# Patient Record
Sex: Male | Born: 1935 | Race: Black or African American | Hispanic: No | Smoking: Never smoker
Health system: Southern US, Community
[De-identification: ages and names within clinical notes are randomized; demographics above are authoritative.]

## PROBLEM LIST (undated history)

## (undated) DIAGNOSIS — M1990 Unspecified osteoarthritis, unspecified site: Secondary | ICD-10-CM

## (undated) DIAGNOSIS — I2510 Atherosclerotic heart disease of native coronary artery without angina pectoris: Secondary | ICD-10-CM

## (undated) DIAGNOSIS — I770 Arteriovenous fistula, acquired: Secondary | ICD-10-CM

## (undated) DIAGNOSIS — N186 End stage renal disease: Secondary | ICD-10-CM

## (undated) DIAGNOSIS — K6289 Other specified diseases of anus and rectum: Secondary | ICD-10-CM

## (undated) DIAGNOSIS — E785 Hyperlipidemia, unspecified: Secondary | ICD-10-CM

## (undated) DIAGNOSIS — Z794 Long term (current) use of insulin: Secondary | ICD-10-CM

## (undated) DIAGNOSIS — Z8673 Personal history of transient ischemic attack (TIA), and cerebral infarction without residual deficits: Secondary | ICD-10-CM

## (undated) DIAGNOSIS — I96 Gangrene, not elsewhere classified: Secondary | ICD-10-CM

## (undated) DIAGNOSIS — I639 Cerebral infarction, unspecified: Secondary | ICD-10-CM

## (undated) DIAGNOSIS — K5900 Constipation, unspecified: Secondary | ICD-10-CM

## (undated) DIAGNOSIS — D649 Anemia, unspecified: Secondary | ICD-10-CM

## (undated) DIAGNOSIS — E119 Type 2 diabetes mellitus without complications: Secondary | ICD-10-CM

## (undated) DIAGNOSIS — Z992 Dependence on renal dialysis: Secondary | ICD-10-CM

## (undated) DIAGNOSIS — I219 Acute myocardial infarction, unspecified: Secondary | ICD-10-CM

## (undated) DIAGNOSIS — I10 Essential (primary) hypertension: Secondary | ICD-10-CM

## (undated) HISTORY — PX: CATARACT EXTRACTION W/ INTRAOCULAR LENS  IMPLANT, BILATERAL: SHX1307

## (undated) HISTORY — PX: ARTERIOVENOUS GRAFT PLACEMENT: SUR1029

---

## 2007-02-09 HISTORY — PX: TOE AMPUTATION: SHX809

## 2007-12-30 ENCOUNTER — Encounter (INDEPENDENT_AMBULATORY_CARE_PROVIDER_SITE_OTHER): Payer: Self-pay | Admitting: Internal Medicine

## 2008-01-19 ENCOUNTER — Ambulatory Visit: Payer: Self-pay | Admitting: Internal Medicine

## 2008-01-19 DIAGNOSIS — I10 Essential (primary) hypertension: Secondary | ICD-10-CM

## 2008-01-19 DIAGNOSIS — K219 Gastro-esophageal reflux disease without esophagitis: Secondary | ICD-10-CM

## 2008-01-19 DIAGNOSIS — Z8679 Personal history of other diseases of the circulatory system: Secondary | ICD-10-CM

## 2008-01-19 DIAGNOSIS — I252 Old myocardial infarction: Secondary | ICD-10-CM

## 2008-01-19 DIAGNOSIS — M1990 Unspecified osteoarthritis, unspecified site: Secondary | ICD-10-CM | POA: Insufficient documentation

## 2008-01-19 DIAGNOSIS — E119 Type 2 diabetes mellitus without complications: Secondary | ICD-10-CM

## 2008-01-19 LAB — CONVERTED CEMR LAB: Blood Glucose, Fingerstick: 268

## 2008-01-20 LAB — CONVERTED CEMR LAB
AST: 38 units/L — ABNORMAL HIGH (ref 0–37)
BUN: 36 mg/dL — ABNORMAL HIGH (ref 6–23)
CO2: 19 meq/L (ref 19–32)
Calcium: 8.9 mg/dL (ref 8.4–10.5)
Chloride: 103 meq/L (ref 96–112)
Creatinine, Ser: 2.29 mg/dL — ABNORMAL HIGH (ref 0.40–1.50)

## 2008-01-22 ENCOUNTER — Telehealth (INDEPENDENT_AMBULATORY_CARE_PROVIDER_SITE_OTHER): Payer: Self-pay | Admitting: *Deleted

## 2008-01-25 ENCOUNTER — Ambulatory Visit: Payer: Self-pay | Admitting: Internal Medicine

## 2008-01-25 LAB — CONVERTED CEMR LAB: Blood Glucose, Fingerstick: 412

## 2008-02-01 ENCOUNTER — Ambulatory Visit: Payer: Self-pay | Admitting: Internal Medicine

## 2008-02-16 ENCOUNTER — Ambulatory Visit: Payer: Self-pay | Admitting: Internal Medicine

## 2008-03-18 ENCOUNTER — Ambulatory Visit: Payer: Self-pay | Admitting: Internal Medicine

## 2008-03-18 LAB — CONVERTED CEMR LAB: Blood Glucose, Fingerstick: 339

## 2008-04-18 ENCOUNTER — Ambulatory Visit: Payer: Self-pay | Admitting: Internal Medicine

## 2008-04-18 LAB — CONVERTED CEMR LAB: Blood Glucose, Fingerstick: 256

## 2008-05-16 ENCOUNTER — Ambulatory Visit: Payer: Self-pay | Admitting: Internal Medicine

## 2008-06-13 ENCOUNTER — Ambulatory Visit: Payer: Self-pay | Admitting: Internal Medicine

## 2008-07-11 ENCOUNTER — Ambulatory Visit: Payer: Self-pay | Admitting: Internal Medicine

## 2008-07-11 DIAGNOSIS — R319 Hematuria, unspecified: Secondary | ICD-10-CM | POA: Insufficient documentation

## 2008-07-12 ENCOUNTER — Encounter (INDEPENDENT_AMBULATORY_CARE_PROVIDER_SITE_OTHER): Payer: Self-pay | Admitting: Internal Medicine

## 2008-07-12 LAB — CONVERTED CEMR LAB
CO2: 21 meq/L (ref 19–32)
Cholesterol: 208 mg/dL — ABNORMAL HIGH (ref 0–200)
Creatinine, Ser: 2.38 mg/dL — ABNORMAL HIGH (ref 0.40–1.50)
Glucose, Bld: 172 mg/dL — ABNORMAL HIGH (ref 70–99)
Total Bilirubin: 0.5 mg/dL (ref 0.3–1.2)
Total CHOL/HDL Ratio: 1.8
Triglycerides: 46 mg/dL (ref <150)
VLDL: 9 mg/dL (ref 0–40)

## 2008-07-28 ENCOUNTER — Encounter (INDEPENDENT_AMBULATORY_CARE_PROVIDER_SITE_OTHER): Payer: Self-pay | Admitting: Internal Medicine

## 2008-07-28 LAB — CONVERTED CEMR LAB: Bacteria, UA: NONE SEEN

## 2008-08-22 ENCOUNTER — Ambulatory Visit: Payer: Self-pay | Admitting: Internal Medicine

## 2008-08-22 DIAGNOSIS — R5381 Other malaise: Secondary | ICD-10-CM

## 2008-08-22 DIAGNOSIS — R5383 Other fatigue: Secondary | ICD-10-CM

## 2008-08-22 LAB — CONVERTED CEMR LAB
AST: 29 units/L (ref 0–37)
Albumin: 4.1 g/dL (ref 3.5–5.2)
BUN: 58 mg/dL — ABNORMAL HIGH (ref 6–23)
Calcium: 9.2 mg/dL (ref 8.4–10.5)
Chloride: 110 meq/L (ref 96–112)
Eosinophils Relative: 2 % (ref 0–5)
HCT: 35.7 % — ABNORMAL LOW (ref 39.0–52.0)
Hemoglobin: 12.4 g/dL — ABNORMAL LOW (ref 13.0–17.0)
Lymphocytes Relative: 24 % (ref 12–46)
Lymphs Abs: 1.1 10*3/uL (ref 0.7–4.0)
Monocytes Absolute: 0.2 10*3/uL (ref 0.1–1.0)
Platelets: 134 10*3/uL — ABNORMAL LOW (ref 150–400)
Potassium: 5.8 meq/L — ABNORMAL HIGH (ref 3.5–5.3)
Sodium: 135 meq/L (ref 135–145)
Total Protein: 7.2 g/dL (ref 6.0–8.3)
WBC: 4.5 10*3/uL (ref 4.0–10.5)

## 2008-09-19 ENCOUNTER — Ambulatory Visit: Payer: Self-pay | Admitting: Internal Medicine

## 2008-09-19 DIAGNOSIS — N182 Chronic kidney disease, stage 2 (mild): Secondary | ICD-10-CM | POA: Insufficient documentation

## 2008-09-21 LAB — CONVERTED CEMR LAB
Albumin: 4 g/dL (ref 3.5–5.2)
BUN: 48 mg/dL — ABNORMAL HIGH (ref 6–23)
Calcium: 8.5 mg/dL (ref 8.4–10.5)
Creatinine, Ser: 2.47 mg/dL — ABNORMAL HIGH (ref 0.40–1.50)
Phosphorus: 3.4 mg/dL (ref 2.3–4.6)
Potassium: 4.8 meq/L (ref 3.5–5.3)

## 2008-11-10 ENCOUNTER — Ambulatory Visit: Payer: Self-pay | Admitting: Internal Medicine

## 2011-04-25 ENCOUNTER — Ambulatory Visit: Payer: Self-pay | Admitting: Vascular Surgery

## 2011-04-25 LAB — CBC
HCT: 40.1 % (ref 40.0–52.0)
MCV: 88 fL (ref 80–100)
Platelet: 124 10*3/uL — ABNORMAL LOW (ref 150–440)
RBC: 4.56 10*6/uL (ref 4.40–5.90)
WBC: 7.2 10*3/uL (ref 3.8–10.6)

## 2011-04-25 LAB — BASIC METABOLIC PANEL
Calcium, Total: 8 mg/dL — ABNORMAL LOW (ref 8.5–10.1)
Co2: 20 mmol/L — ABNORMAL LOW (ref 21–32)
EGFR (African American): 19 — ABNORMAL LOW
EGFR (Non-African Amer.): 15 — ABNORMAL LOW
Osmolality: 278 (ref 275–301)
Sodium: 132 mmol/L — ABNORMAL LOW (ref 136–145)

## 2011-05-02 ENCOUNTER — Ambulatory Visit: Payer: Self-pay | Admitting: Vascular Surgery

## 2011-06-06 ENCOUNTER — Ambulatory Visit: Payer: Self-pay | Admitting: Vascular Surgery

## 2011-07-18 ENCOUNTER — Ambulatory Visit: Payer: Self-pay | Admitting: Vascular Surgery

## 2011-07-18 LAB — GLUCOSE, RANDOM: Glucose: 162 mg/dL — ABNORMAL HIGH (ref 65–99)

## 2011-09-26 ENCOUNTER — Ambulatory Visit: Payer: Self-pay | Admitting: Vascular Surgery

## 2011-10-15 ENCOUNTER — Ambulatory Visit: Payer: Self-pay | Admitting: Vascular Surgery

## 2011-10-15 LAB — CBC
MCH: 31.4 pg (ref 26.0–34.0)
Platelet: 121 10*3/uL — ABNORMAL LOW (ref 150–440)
RBC: 3.74 10*6/uL — ABNORMAL LOW (ref 4.40–5.90)
WBC: 5 10*3/uL (ref 3.8–10.6)

## 2011-10-15 LAB — BASIC METABOLIC PANEL
Calcium, Total: 8.1 mg/dL — ABNORMAL LOW (ref 8.5–10.1)
Chloride: 104 mmol/L (ref 98–107)
Co2: 24 mmol/L (ref 21–32)
EGFR (Non-African Amer.): 14 — ABNORMAL LOW
Glucose: 121 mg/dL — ABNORMAL HIGH (ref 65–99)
Potassium: 4.3 mmol/L (ref 3.5–5.1)
Sodium: 138 mmol/L (ref 136–145)

## 2011-11-28 ENCOUNTER — Ambulatory Visit: Payer: Self-pay | Admitting: Vascular Surgery

## 2011-11-29 LAB — CBC WITH DIFFERENTIAL/PLATELET
Basophil %: 0.4 %
Eosinophil #: 0 10*3/uL (ref 0.0–0.7)
Eosinophil %: 0.1 %
HGB: 11.8 g/dL — ABNORMAL LOW (ref 13.0–18.0)
Lymphocyte %: 13.8 %
MCHC: 33.9 g/dL (ref 32.0–36.0)
Neutrophil %: 80.5 %
Platelet: 97 10*3/uL — ABNORMAL LOW (ref 150–440)
RBC: 3.81 10*6/uL — ABNORMAL LOW (ref 4.40–5.90)

## 2012-01-20 ENCOUNTER — Ambulatory Visit: Payer: Self-pay | Admitting: Vascular Surgery

## 2012-01-21 ENCOUNTER — Ambulatory Visit: Payer: Self-pay | Admitting: Vascular Surgery

## 2012-01-29 ENCOUNTER — Other Ambulatory Visit (HOSPITAL_COMMUNITY): Payer: Self-pay | Admitting: Nephrology

## 2012-01-29 DIAGNOSIS — N186 End stage renal disease: Secondary | ICD-10-CM

## 2012-02-04 ENCOUNTER — Other Ambulatory Visit (HOSPITAL_COMMUNITY): Payer: Self-pay | Admitting: Nephrology

## 2012-02-04 ENCOUNTER — Encounter (HOSPITAL_COMMUNITY): Payer: Self-pay

## 2012-02-04 ENCOUNTER — Ambulatory Visit (HOSPITAL_COMMUNITY)
Admission: RE | Admit: 2012-02-04 | Discharge: 2012-02-04 | Disposition: A | Discharge status: Home / Self Care | Payer: Medicare Other | Source: Ambulatory Visit | Attending: Nephrology | Admitting: Nephrology

## 2012-02-04 DIAGNOSIS — Z992 Dependence on renal dialysis: Secondary | ICD-10-CM | POA: Insufficient documentation

## 2012-02-04 DIAGNOSIS — I120 Hypertensive chronic kidney disease with stage 5 chronic kidney disease or end stage renal disease: Secondary | ICD-10-CM | POA: Insufficient documentation

## 2012-02-04 DIAGNOSIS — N186 End stage renal disease: Secondary | ICD-10-CM | POA: Insufficient documentation

## 2012-02-04 DIAGNOSIS — I2510 Atherosclerotic heart disease of native coronary artery without angina pectoris: Secondary | ICD-10-CM | POA: Insufficient documentation

## 2012-02-04 DIAGNOSIS — Z8673 Personal history of transient ischemic attack (TIA), and cerebral infarction without residual deficits: Secondary | ICD-10-CM | POA: Insufficient documentation

## 2012-02-04 DIAGNOSIS — T82898A Other specified complication of vascular prosthetic devices, implants and grafts, initial encounter: Secondary | ICD-10-CM | POA: Insufficient documentation

## 2012-02-04 DIAGNOSIS — E119 Type 2 diabetes mellitus without complications: Secondary | ICD-10-CM | POA: Insufficient documentation

## 2012-02-04 DIAGNOSIS — Y832 Surgical operation with anastomosis, bypass or graft as the cause of abnormal reaction of the patient, or of later complication, without mention of misadventure at the time of the procedure: Secondary | ICD-10-CM | POA: Insufficient documentation

## 2012-02-04 HISTORY — DX: Acute myocardial infarction, unspecified: I21.9

## 2012-02-04 HISTORY — DX: Essential (primary) hypertension: I10

## 2012-02-04 HISTORY — DX: Cerebral infarction, unspecified: I63.9

## 2012-02-04 LAB — POCT I-STAT 4, (NA,K, GLUC, HGB,HCT)
Glucose, Bld: 102 mg/dL — ABNORMAL HIGH (ref 70–99)
HCT: 32 % — ABNORMAL LOW (ref 39.0–52.0)
Hemoglobin: 10.9 g/dL — ABNORMAL LOW (ref 13.0–17.0)
Potassium: 4.2 mEq/L (ref 3.5–5.1)
Sodium: 135 mEq/L (ref 135–145)

## 2012-02-04 LAB — GLUCOSE, CAPILLARY: Glucose-Capillary: 84 mg/dL (ref 70–99)

## 2012-02-04 MED ORDER — FENTANYL CITRATE 0.05 MG/ML IJ SOLN
INTRAMUSCULAR | Status: AC | PRN
  Administered 2012-02-04: 50 ug via INTRAVENOUS
  Administered 2012-02-04 (×2): 25 ug via INTRAVENOUS

## 2012-02-04 MED ORDER — HEPARIN SODIUM (PORCINE) 1000 UNIT/ML IJ SOLN
INTRAMUSCULAR | Status: AC | PRN
  Administered 2012-02-04: 3000 [IU] via INTRAVENOUS

## 2012-02-04 MED ORDER — MIDAZOLAM HCL 2 MG/2ML IJ SOLN
INTRAMUSCULAR | Status: AC | PRN
  Administered 2012-02-04 (×2): 1 mg via INTRAVENOUS

## 2012-02-04 MED ORDER — MIDAZOLAM HCL 2 MG/2ML IJ SOLN
INTRAMUSCULAR | Status: AC
  Filled 2012-02-04: qty 4

## 2012-02-04 MED ORDER — IOHEXOL 300 MG/ML  SOLN
100.0000 mL | Freq: Once | INTRAMUSCULAR | Status: AC | PRN
  Administered 2012-02-04: 50 mL via INTRAVENOUS

## 2012-02-04 MED ORDER — FENTANYL CITRATE 0.05 MG/ML IJ SOLN
INTRAMUSCULAR | Status: AC
  Filled 2012-02-04: qty 4

## 2012-02-04 MED ORDER — HEPARIN SODIUM (PORCINE) 1000 UNIT/ML IJ SOLN
INTRAMUSCULAR | Status: AC
  Filled 2012-02-04: qty 1

## 2012-02-04 MED ORDER — ALTEPLASE 100 MG IV SOLR
2.0000 mg | Freq: Once | INTRAVENOUS | Status: DC
  Filled 2012-02-04: qty 2

## 2012-02-04 MED ORDER — ALTEPLASE 2 MG IJ SOLR
INTRAMUSCULAR | Status: AC | PRN
  Administered 2012-02-04: 2 mg

## 2012-02-04 NOTE — ED Notes (Addendum)
Discharge instructions reviewed with pt and his girlfriend and a copy was given to the pt.

## 2012-02-04 NOTE — Procedures (Signed)
LUE AVF declot PTA central venous 10 mm PTA perif venous 6 mm PTA art 5 mm No comp

## 2012-02-04 NOTE — ED Notes (Signed)
IV to right hand, 22 g with clean dry intact dressing.  Flushed with ease.  Pt denies discomfort at site, site without redness, warmth or edema

## 2012-02-04 NOTE — H&P (Signed)
Charles Vasquez is an 76 y.o. male.   Chief Complaint: left upper arm dialysis access clotted x 2-3 weeks Has existing HD catheter: 2 weeks Last use catheter yesterday without problem Has had declot of left arm access at [facility] : unsuccessful Scheduled now for thrombolysis and possible angioplasty/stent placement HPI: CVA; CAD; ESRD; DM; HTN  Past Medical History  Diagnosis Date  . Diabetes mellitus without complication   . Hypertension   . Chronic kidney disease     History reviewed. No pertinent past surgical history.  History reviewed. No pertinent family history. Social History:  does not have a smoking history on file. He does not have any smokeless tobacco history on file. His alcohol and drug histories not on file.  Allergies:  Allergies  Allergen Reactions  . Lisinopril     REACTION: hyperkalemia/renal deterioration     (Not in a hospital admission)  Results for orders placed during the hospital encounter of 02/04/12 (from the past 48 hour(s))  GLUCOSE, CAPILLARY     Status: Normal   Collection Time   02/04/12  7:46 AM      Component Value Range Comment   Glucose-Capillary 84  70 - 99 mg/dL    No results found.  Review of Systems  Constitutional: Negative for fever and chills.  Respiratory: Positive for shortness of breath.   Cardiovascular: Negative for chest pain.  Gastrointestinal: Negative for nausea, vomiting and abdominal pain.  Neurological: Negative for weakness and headaches.    Blood pressure 124/71, pulse 65, resp. rate 16, SpO2 100.00%. Physical Exam  Constitutional: He is oriented to person, place, and time.  Cardiovascular: Normal rate, regular rhythm and normal heart sounds.   No murmur heard. Respiratory: Effort normal and breath sounds normal. He has no wheezes.  GI: Soft. Bowel sounds are normal. There is tenderness.  Musculoskeletal: Normal range of motion.       Left upper arm dialysis access clooted  Neurological: He is alert  and oriented to person, place, and time.  Psychiatric: He has a normal mood and affect. His behavior is normal. Judgment and thought content normal.     Assessment/Plan Unsuccessful attempt at thrombolysis of left upper arm dialysis access [facility]) Has existing HD catheter in place: good fxn Scheduled for thrombolysis and poss pta/stent Pt aware of procedure benefits and risks and agreeable to proceed. Consent signed and in chart  Charles Vasquez A 02/04/2012, 7:51 AM

## 2012-02-04 NOTE — ED Notes (Addendum)
Pt discharged to the care of his girlfriend.  Pt taken to exit via wheelchair

## 2012-02-07 ENCOUNTER — Telehealth (HOSPITAL_COMMUNITY): Payer: Self-pay

## 2012-02-12 ENCOUNTER — Other Ambulatory Visit (HOSPITAL_COMMUNITY): Payer: Self-pay | Admitting: Nephrology

## 2012-02-12 DIAGNOSIS — N186 End stage renal disease: Secondary | ICD-10-CM

## 2012-02-20 ENCOUNTER — Ambulatory Visit (HOSPITAL_COMMUNITY): Admission: RE | Admit: 2012-02-20 | Payer: Medicare Other | Source: Ambulatory Visit

## 2012-02-25 ENCOUNTER — Other Ambulatory Visit: Payer: Self-pay | Admitting: *Deleted

## 2012-02-25 DIAGNOSIS — Z01810 Encounter for preprocedural cardiovascular examination: Secondary | ICD-10-CM

## 2012-02-25 DIAGNOSIS — N186 End stage renal disease: Secondary | ICD-10-CM

## 2012-03-16 ENCOUNTER — Encounter: Payer: Self-pay | Admitting: Vascular Surgery

## 2012-03-17 ENCOUNTER — Encounter (INDEPENDENT_AMBULATORY_CARE_PROVIDER_SITE_OTHER): Payer: Medicare Other | Admitting: *Deleted

## 2012-03-17 ENCOUNTER — Encounter: Payer: Self-pay | Admitting: Vascular Surgery

## 2012-03-17 ENCOUNTER — Ambulatory Visit (INDEPENDENT_AMBULATORY_CARE_PROVIDER_SITE_OTHER): Payer: Medicare Other | Admitting: Vascular Surgery

## 2012-03-17 VITALS — BP 149/71 | HR 91 | Resp 18 | Ht 68.0 in | Wt 136.5 lb

## 2012-03-17 DIAGNOSIS — N186 End stage renal disease: Secondary | ICD-10-CM

## 2012-03-17 DIAGNOSIS — N185 Chronic kidney disease, stage 5: Secondary | ICD-10-CM

## 2012-03-17 DIAGNOSIS — Z01810 Encounter for preprocedural cardiovascular examination: Secondary | ICD-10-CM

## 2012-03-17 NOTE — Progress Notes (Signed)
Subjective:     Patient ID: Charles Vasquez, male   DOB: 08/12/1935, 77 y.o.   MRN: [id number]  HPI this 77 year old male was referred for vascular access. He dialyzes in [place]. He had a previous basilic vein transposition performed an [place]. Recently he has had attempts to salvage this including stents which have been unsuccessful. He has never had access in the right upper extremity. He currently has a hemodialysis catheter in the right internal jugular vein.  Past Medical History  Diagnosis Date  . Diabetes mellitus without complication   . Hypertension   . Chronic kidney disease   . Stroke   . Myocardial infarction     History  Substance Use Topics  . Smoking status: Never Smoker   . Smokeless tobacco: Never Used  . Alcohol Use: No    Family History  Problem Relation Age of Onset  . Diabetes Mother   . Hypertension Mother   . Diabetes Father   . Hypertension Father     Allergies  Allergen Reactions  . Lisinopril     REACTION: hyperkalemia/renal deterioration    Current outpatient prescriptions:amLODipine (NORVASC) 10 MG tablet, Take 10 mg by mouth daily., Disp: , Rfl: ;  aspirin EC 81 MG tablet, Take 81 mg by mouth daily., Disp: , Rfl: ;  calcium carbonate (OS-CAL - DOSED IN MG OF ELEMENTAL CALCIUM) 1250 MG tablet, Take 1 tablet by mouth 2 (two) times daily., Disp: , Rfl: ;  cholecalciferol (VITAMIN D) 400 UNITS TABS, Take 2,000 Units by mouth daily., Disp: , Rfl:  furosemide (LASIX) 40 MG tablet, Take 40 mg by mouth 2 (two) times daily., Disp: , Rfl: ;  insulin glargine (LANTUS) 100 UNIT/ML injection, Inject 10 Units into the skin at bedtime., Disp: , Rfl: ;  multivitamin (RENA-VIT) TABS tablet, Take 1 tablet by mouth daily., Disp: , Rfl: ;  insulin lispro (HUMALOG) 100 UNIT/ML injection, Inject 1 Units into the skin 3 (three) times daily before meals. Sliding scale, Disp: , Rfl:  sodium bicarbonate 650 MG tablet, Take 650 mg by mouth 2 (two) times daily., Disp: ,  Rfl:   BP 149/71  Pulse 91  Resp 18  Ht 5\' 8"  (1.727 m)  Wt 136 lb 8 oz (61.916 kg)  BMI 20.75 kg/m2  Body mass index is 20.75 kg/(m^2).           Review of Systems denies chest pain, dyspnea on exertion, PND, orthopnea, hemoptysis.     Objective:   Physical Exam blood pressure 149/71 heart rate 91 respirations 18 Gen.-alert and oriented x3 in no apparent distress HEENT normal for age Lungs no rhonchi or wheezing Cardiovascular regular rhythm no murmurs carotid pulses 3+ palpable no bruits audible Abdomen soft nontender no palpable masses Musculoskeletal free of  major deformities Skin clear -no rashes Neurologic normal Lower extremities 3+ femoral and dorsalis pedis pulses palpable bilaterally with no edema Left upper extremity with evidence of previous basilic vein transposition with well-healed incision. 2 Sutures in distal upper arm where intervention performed by radiology department Right upper extremity with 3+ brachial and 1-2+ radial pulse palpable.  Today I ordered bilateral vein mapping which are reviewed and interpreted. Left leg is not a candidate for fistula. Right upper extremity has poor cephalic and basilic veins. Basilic vein is patent but very thickened in the antecubital area.      Assessment:     End-stage renal disease with failed access left upper extremity performed an [place] within past year-not responsible  Plan:     Plan patient needs right arm AVG-would like this performed next Tuesday-we'll schedule this for Dr. Johny Drilling on Tuesday, January 14

## 2012-03-19 ENCOUNTER — Other Ambulatory Visit: Payer: Self-pay

## 2012-03-30 MED ORDER — CEFAZOLIN SODIUM-DEXTROSE 2-3 GM-% IV SOLR
2.0000 g | Freq: Once | INTRAVENOUS | Status: AC
  Administered 2012-03-31: 2 g via INTRAVENOUS
  Filled 2012-03-30: qty 50

## 2012-03-31 ENCOUNTER — Encounter (HOSPITAL_COMMUNITY): Payer: Self-pay | Admitting: *Deleted

## 2012-03-31 ENCOUNTER — Telehealth: Payer: Self-pay | Admitting: Vascular Surgery

## 2012-03-31 ENCOUNTER — Ambulatory Visit (HOSPITAL_COMMUNITY): Payer: Medicare Other

## 2012-03-31 ENCOUNTER — Encounter (HOSPITAL_COMMUNITY): Payer: Self-pay | Admitting: Anesthesiology

## 2012-03-31 ENCOUNTER — Ambulatory Visit (HOSPITAL_COMMUNITY): Payer: Medicare Other | Admitting: Anesthesiology

## 2012-03-31 ENCOUNTER — Ambulatory Visit (HOSPITAL_COMMUNITY)
Admission: RE | Admit: 2012-03-31 | Discharge: 2012-03-31 | Disposition: A | Discharge status: Home / Self Care | Payer: Medicare Other | Source: Ambulatory Visit | Attending: Vascular Surgery | Admitting: Vascular Surgery

## 2012-03-31 ENCOUNTER — Encounter (HOSPITAL_COMMUNITY)
Admission: RE | Disposition: A | Discharge status: Home / Self Care | Payer: Self-pay | Source: Ambulatory Visit | Attending: Vascular Surgery

## 2012-03-31 DIAGNOSIS — I252 Old myocardial infarction: Secondary | ICD-10-CM | POA: Insufficient documentation

## 2012-03-31 DIAGNOSIS — N186 End stage renal disease: Secondary | ICD-10-CM

## 2012-03-31 DIAGNOSIS — K219 Gastro-esophageal reflux disease without esophagitis: Secondary | ICD-10-CM | POA: Insufficient documentation

## 2012-03-31 DIAGNOSIS — Z794 Long term (current) use of insulin: Secondary | ICD-10-CM | POA: Insufficient documentation

## 2012-03-31 DIAGNOSIS — Z7982 Long term (current) use of aspirin: Secondary | ICD-10-CM | POA: Insufficient documentation

## 2012-03-31 DIAGNOSIS — E119 Type 2 diabetes mellitus without complications: Secondary | ICD-10-CM | POA: Insufficient documentation

## 2012-03-31 DIAGNOSIS — Z79899 Other long term (current) drug therapy: Secondary | ICD-10-CM | POA: Insufficient documentation

## 2012-03-31 DIAGNOSIS — Z8673 Personal history of transient ischemic attack (TIA), and cerebral infarction without residual deficits: Secondary | ICD-10-CM | POA: Insufficient documentation

## 2012-03-31 DIAGNOSIS — Z992 Dependence on renal dialysis: Secondary | ICD-10-CM | POA: Insufficient documentation

## 2012-03-31 DIAGNOSIS — I120 Hypertensive chronic kidney disease with stage 5 chronic kidney disease or end stage renal disease: Secondary | ICD-10-CM | POA: Insufficient documentation

## 2012-03-31 HISTORY — PX: AV FISTULA PLACEMENT: SHX1204

## 2012-03-31 LAB — POCT I-STAT, CHEM 8
BUN: 35 mg/dL — ABNORMAL HIGH (ref 6–23)
Chloride: 100 mEq/L (ref 96–112)
HCT: 39 % (ref 39.0–52.0)
Sodium: 137 mEq/L (ref 135–145)
TCO2: 30 mmol/L (ref 0–100)

## 2012-03-31 LAB — GLUCOSE, CAPILLARY
Glucose-Capillary: 63 mg/dL — ABNORMAL LOW (ref 70–99)
Glucose-Capillary: 90 mg/dL (ref 70–99)

## 2012-03-31 LAB — SURGICAL PCR SCREEN
MRSA, PCR: NEGATIVE
Staphylococcus aureus: NEGATIVE

## 2012-03-31 SURGERY — ARTERIOVENOUS (AV) FISTULA CREATION
Anesthesia: Monitor Anesthesia Care | Site: Arm Upper | Laterality: Right | Wound class: Clean

## 2012-03-31 MED ORDER — PROPOFOL INFUSION 10 MG/ML OPTIME
INTRAVENOUS | Status: DC | PRN
  Administered 2012-03-31: 25 ug/kg/min via INTRAVENOUS

## 2012-03-31 MED ORDER — OXYCODONE HCL 5 MG PO TABS: 5.0000 mg | ORAL_TABLET | ORAL | Status: DC | PRN

## 2012-03-31 MED ORDER — HYDROMORPHONE HCL PF 1 MG/ML IJ SOLN: 0.2500 mg | INTRAMUSCULAR | Status: DC | PRN

## 2012-03-31 MED ORDER — MIDAZOLAM HCL 5 MG/5ML IJ SOLN
INTRAMUSCULAR | Status: DC | PRN
  Administered 2012-03-31: 1 mg via INTRAVENOUS

## 2012-03-31 MED ORDER — OXYCODONE HCL 5 MG/5ML PO SOLN: 5.0000 mg | Freq: Once | ORAL | Status: DC | PRN

## 2012-03-31 MED ORDER — SODIUM CHLORIDE 0.9 % IR SOLN
Status: DC | PRN
  Administered 2012-03-31: 07:00:00

## 2012-03-31 MED ORDER — 0.9 % SODIUM CHLORIDE (POUR BTL) OPTIME
TOPICAL | Status: DC | PRN
  Administered 2012-03-31: 1000 mL

## 2012-03-31 MED ORDER — SODIUM CHLORIDE 0.9 % IV SOLN: INTRAVENOUS | Status: DC

## 2012-03-31 MED ORDER — PROMETHAZINE HCL 25 MG/ML IJ SOLN: 6.2500 mg | INTRAMUSCULAR | Status: DC | PRN

## 2012-03-31 MED ORDER — SODIUM CHLORIDE 0.9 % IV SOLN
INTRAVENOUS | Status: DC | PRN
  Administered 2012-03-31: 07:00:00 via INTRAVENOUS

## 2012-03-31 MED ORDER — LIDOCAINE-EPINEPHRINE (PF) 1 %-1:200000 IJ SOLN
INTRAMUSCULAR | Status: AC
  Filled 2012-03-31: qty 10

## 2012-03-31 MED ORDER — MUPIROCIN 2 % EX OINT
TOPICAL_OINTMENT | Freq: Two times a day (BID) | CUTANEOUS | Status: DC
  Filled 2012-03-31: qty 22

## 2012-03-31 MED ORDER — OXYCODONE HCL 5 MG PO TABS: 5.0000 mg | ORAL_TABLET | Freq: Once | ORAL | Status: DC | PRN

## 2012-03-31 MED ORDER — LIDOCAINE-EPINEPHRINE (PF) 1 %-1:200000 IJ SOLN
INTRAMUSCULAR | Status: DC | PRN
  Administered 2012-03-31: 3.5 mL

## 2012-03-31 MED ORDER — BUPIVACAINE HCL (PF) 0.5 % IJ SOLN
INTRAMUSCULAR | Status: AC
  Filled 2012-03-31: qty 30

## 2012-03-31 MED ORDER — BUPIVACAINE HCL (PF) 0.5 % IJ SOLN
INTRAMUSCULAR | Status: DC | PRN
  Administered 2012-03-31: 3.5 mL

## 2012-03-31 MED ORDER — FENTANYL CITRATE 0.05 MG/ML IJ SOLN
INTRAMUSCULAR | Status: DC | PRN
  Administered 2012-03-31: 50 ug via INTRAVENOUS

## 2012-03-31 SURGICAL SUPPLY — 42 items
CANISTER SUCTION 2500CC (MISCELLANEOUS) ×2 IMPLANT
CLIP TI MEDIUM 6 (CLIP) ×2 IMPLANT
CLIP TI WIDE RED SMALL 6 (CLIP) ×2 IMPLANT
CLOTH BEACON ORANGE TIMEOUT ST (SAFETY) ×2 IMPLANT
COVER SURGICAL LIGHT HANDLE (MISCELLANEOUS) ×2 IMPLANT
COVER TRANSDUCER ULTRASND GEL (DRAPE) ×2 IMPLANT
DECANTER SPIKE VIAL GLASS SM (MISCELLANEOUS) IMPLANT
DERMABOND ADHESIVE PROPEN (GAUZE/BANDAGES/DRESSINGS) ×1
DERMABOND ADVANCED (GAUZE/BANDAGES/DRESSINGS) ×1
DERMABOND ADVANCED .7 DNX12 (GAUZE/BANDAGES/DRESSINGS) ×1 IMPLANT
DERMABOND ADVANCED .7 DNX6 (GAUZE/BANDAGES/DRESSINGS) ×1 IMPLANT
ELECT REM PT RETURN 9FT ADLT (ELECTROSURGICAL) ×2
ELECTRODE REM PT RTRN 9FT ADLT (ELECTROSURGICAL) ×1 IMPLANT
GLOVE BIO SURGEON STRL SZ7 (GLOVE) ×2 IMPLANT
GLOVE BIOGEL PI IND STRL 6.5 (GLOVE) ×1 IMPLANT
GLOVE BIOGEL PI IND STRL 7.0 (GLOVE) ×1 IMPLANT
GLOVE BIOGEL PI IND STRL 7.5 (GLOVE) ×1 IMPLANT
GLOVE BIOGEL PI INDICATOR 6.5 (GLOVE) ×1
GLOVE BIOGEL PI INDICATOR 7.0 (GLOVE) ×1
GLOVE BIOGEL PI INDICATOR 7.5 (GLOVE) ×1
GLOVE SS BIOGEL STRL SZ 6.5 (GLOVE) ×1 IMPLANT
GLOVE SUPERSENSE BIOGEL SZ 6.5 (GLOVE) ×1
GLOVE SURG SS PI 7.0 STRL IVOR (GLOVE) ×2 IMPLANT
GOWN STRL NON-REIN LRG LVL3 (GOWN DISPOSABLE) ×4 IMPLANT
GOWN STRL REIN XL XLG (GOWN DISPOSABLE) ×2 IMPLANT
KIT BASIN OR (CUSTOM PROCEDURE TRAY) ×2 IMPLANT
KIT ROOM TURNOVER OR (KITS) ×2 IMPLANT
NS IRRIG 1000ML POUR BTL (IV SOLUTION) ×2 IMPLANT
PACK CV ACCESS (CUSTOM PROCEDURE TRAY) ×2 IMPLANT
PAD ARMBOARD 7.5X6 YLW CONV (MISCELLANEOUS) ×4 IMPLANT
SPONGE SURGIFOAM ABS GEL 100 (HEMOSTASIS) IMPLANT
SUT MNCRL AB 4-0 PS2 18 (SUTURE) ×2 IMPLANT
SUT PROLENE 5 0 C 1 24 (SUTURE) IMPLANT
SUT PROLENE 6 0 BV (SUTURE) ×4 IMPLANT
SUT PROLENE 7 0 BV 1 (SUTURE) IMPLANT
SUT SILK 2 0 FS (SUTURE) ×2 IMPLANT
SUT VIC AB 3-0 SH 27 (SUTURE) ×2
SUT VIC AB 3-0 SH 27X BRD (SUTURE) ×2 IMPLANT
TOWEL OR 17X24 6PK STRL BLUE (TOWEL DISPOSABLE) ×2 IMPLANT
TOWEL OR 17X26 10 PK STRL BLUE (TOWEL DISPOSABLE) ×2 IMPLANT
UNDERPAD 30X30 INCONTINENT (UNDERPADS AND DIAPERS) ×2 IMPLANT
WATER STERILE IRR 1000ML POUR (IV SOLUTION) ×2 IMPLANT

## 2012-03-31 NOTE — Telephone Encounter (Signed)
Lm for patient on home phone regarding appointment with [facility], dpm

## 2012-03-31 NOTE — Telephone Encounter (Signed)
Message copied by Fredrich Birks on Tue Mar 31, 2012 12:47 PM ------      Message from: Sharee Pimple      Created: Tue Mar 31, 2012 11:14 AM      Regarding: schedule                   ----- Message -----         From: Melene Plan, RN         Sent: 03/31/2012   9:55 AM           To: [facility] Clinical [facility]                        ----- Message -----         From: [name], MD         Sent: 03/31/2012   9:05 AM           To: Reuel Derby, Melene Plan, RN            WISTER HOEFLE      [phone]      [birth year]            Procedure: R 1st stage brachial vein transposition             Follow-up: 4-6 weeks

## 2012-03-31 NOTE — Op Note (Signed)
OPERATIVE NOTE   PROCEDURE: 1. right first stage brachial vein transposition (brachiobrachial arteriovenous fistula) placement  PRE-OPERATIVE DIAGNOSIS: end stage renal disease   POST-OPERATIVE DIAGNOSIS: same as above   SURGEON: [name], MD  ANESTHESIA: local and MAC  ESTIMATED BLOOD LOSS: 30 cc  FINDING(S): 1. Diseae brachial artery with eccentric posterior plaque 2. Palpable thrill and radial pulse at end of case  SPECIMEN(S):  none  INDICATIONS:   Charles Vasquez is a 77 y.o. male who presents with end stage renal disease and multiple failed left arm accesses.  The patient is scheduled for right arteriovenous graft placement, possible first stage brachial vein transposition.  The patient is aware the risks include but are not limited to: bleeding, infection, steal syndrome, nerve damage, ischemic monomelic neuropathy, failure to mature, and need for additional procedures.  I reiterated multiple times that if a brachial vein transposition was completed, a second procedure was necessary.  The patient is aware of the risks of the procedure and elects to proceed forward.  DESCRIPTION: After full informed written consent was obtained from the patient, the patient was brought back to the operating room and placed supine upon the operating table.  Prior to induction, the patient received IV antibiotics.   After obtaining adequate anesthesia, the patient was then prepped and draped in the standard fashion for a right arm access procedure.  I turned my attention first to identifying the patient's brachial vein and brachial artery.  Using SonoSite guidance, the location of these vessels were marked out on the skin.   At this point, I injected local anesthetic to obtain a field block of the antecubitum.  In total, I injected about 5 mL of a 1:1 mixture of 0.5% Marcaine without epinephrine and 1% lidocaine with epinephrine.  I made a longitudinal incision at the level of the antecubitum and  dissected through the subcutaneous tissue and fascia to gain exposure of the brachial artery.  This was noted to be 3 mm in diameter externally.  Externally the artery appeared to be diseased.  This was dissected out proximally and distally and controlled with vessel loops .  I then dissected out the brachial vein.  This was noted to be 2.5-3.0 mm in diameter externally.  The distal segment of the vein was ligated with a  2-0 silk, and the vein was transected.  The proximal segment was iinterrogated with serial dilators.  The vein accepted up to a 3.5 mm dilator without any difficulty.  I then instilled the heparinized saline into the vein and clamped it.  At this point, I reset my exposure of the brachial artery and placed the artery under tension proximally and distally.  I made an arteriotomy with a #11 blade, and then I extended the arteriotomy with a Potts scissor.  I injected heparinized saline proximal and distal to this arteriotomy.  The vein was then sewn to the artery in an end-to-side configuration with a running stitch of 7-0 Prolene.  Prior to completing this anastomosis, I allowed the vein and artery to backbleed.  There was no evidence of clot from any vessels.  I completed the anastomosis in the usual fashion and then released all vessel loops and clamps.  There was a palpable thrill in the venous outflow, and there was a palpable radial pulse.  At this point, I irrigated out the surgical wound.  There was no further active bleeding.  The subcutaneous tissue was reapproximated with a running stitch of 3-0 Vicryl.  The  skin was then reapproximated with a running subcuticular stitch of 4-0 Vicryl.  The skin was then cleaned, dried, and reinforced with Dermabond.  The patient tolerated this procedure well.   COMPLICATIONS: none  CONDITION: stable  [name], MD [facility] Office: [phone] Pager: [phone]  03/31/2012, 8:52 AM

## 2012-03-31 NOTE — Progress Notes (Signed)
M/w/f dialysis in [place] at [facility]

## 2012-03-31 NOTE — Addendum Note (Signed)
Addendum  created 03/31/12 0959 by Marni Griffon, CRNA   Modules edited:Anesthesia Flowsheet, Anesthesia Medication Administration

## 2012-03-31 NOTE — Interval H&P Note (Signed)
[facility]  History and Physical Update  The patient was interviewed and re-examined.  The patient's previous History and Physical has been reviewed and is unchanged from Dr. Candie Chroman consult on: 03/17/12.  Plan: R arm AVG vs staged brachial vein transposition.  [name], MD [facility] [place] Office: [phone] Pager: [phone]  03/31/2012, 7:25 AM

## 2012-03-31 NOTE — H&P (View-Only) (Signed)
Subjective:     Patient ID: Charles Vasquez, male   DOB: 09/30/1935, 76 y.o.   MRN: [id number]  HPI this 76-year-old male was referred for vascular access. He dialyzes in [place]. He had a previous basilic vein transposition performed an [place]. Recently he has had attempts to salvage this including stents which have been unsuccessful. He has never had access in the right upper extremity. He currently has a hemodialysis catheter in the right internal jugular vein.  Past Medical History  Diagnosis Date  . Diabetes mellitus without complication   . Hypertension   . Chronic kidney disease   . Stroke   . Myocardial infarction     History  Substance Use Topics  . Smoking status: Never Smoker   . Smokeless tobacco: Never Used  . Alcohol Use: No    Family History  Problem Relation Age of Onset  . Diabetes Mother   . Hypertension Mother   . Diabetes Father   . Hypertension Father     Allergies  Allergen Reactions  . Lisinopril     REACTION: hyperkalemia/renal deterioration    Current outpatient prescriptions:amLODipine (NORVASC) 10 MG tablet, Take 10 mg by mouth daily., Disp: , Rfl: ;  aspirin EC 81 MG tablet, Take 81 mg by mouth daily., Disp: , Rfl: ;  calcium carbonate (OS-CAL - DOSED IN MG OF ELEMENTAL CALCIUM) 1250 MG tablet, Take 1 tablet by mouth 2 (two) times daily., Disp: , Rfl: ;  cholecalciferol (VITAMIN D) 400 UNITS TABS, Take 2,000 Units by mouth daily., Disp: , Rfl:  furosemide (LASIX) 40 MG tablet, Take 40 mg by mouth 2 (two) times daily., Disp: , Rfl: ;  insulin glargine (LANTUS) 100 UNIT/ML injection, Inject 10 Units into the skin at bedtime., Disp: , Rfl: ;  multivitamin (RENA-VIT) TABS tablet, Take 1 tablet by mouth daily., Disp: , Rfl: ;  insulin lispro (HUMALOG) 100 UNIT/ML injection, Inject 1 Units into the skin 3 (three) times daily before meals. Sliding scale, Disp: , Rfl:  sodium bicarbonate 650 MG tablet, Take 650 mg by mouth 2 (two) times daily., Disp: ,  Rfl:   BP 149/71  Pulse 91  Resp 18  Ht 5' 8" (1.727 m)  Wt 136 lb 8 oz (61.916 kg)  BMI 20.75 kg/m2  Body mass index is 20.75 kg/(m^2).           Review of Systems denies chest pain, dyspnea on exertion, PND, orthopnea, hemoptysis.     Objective:   Physical Exam blood pressure 149/71 heart rate 91 respirations 18 Gen.-alert and oriented x3 in no apparent distress HEENT normal for age Lungs no rhonchi or wheezing Cardiovascular regular rhythm no murmurs carotid pulses 3+ palpable no bruits audible Abdomen soft nontender no palpable masses Musculoskeletal free of  major deformities Skin clear -no rashes Neurologic normal Lower extremities 3+ femoral and dorsalis pedis pulses palpable bilaterally with no edema Left upper extremity with evidence of previous basilic vein transposition with well-healed incision. 2 Sutures in distal upper arm where intervention performed by radiology department Right upper extremity with 3+ brachial and 1-2+ radial pulse palpable.  Today I ordered bilateral vein mapping which are reviewed and interpreted. Left leg is not a candidate for fistula. Right upper extremity has poor cephalic and basilic veins. Basilic vein is patent but very thickened in the antecubital area.      Assessment:     End-stage renal disease with failed access left upper extremity performed an [place] within past year-not responsible      Plan:     Plan patient needs right arm AVG-would like this performed next Tuesday-we'll schedule this for Dr. Chan on Tuesday, January 14      

## 2012-03-31 NOTE — Progress Notes (Signed)
Pt doesn't have a cardiologist  Stress test done 8-9 yrs ago   Denies ekg or cxr   Medical Md is Dr.Hasanji

## 2012-03-31 NOTE — Addendum Note (Signed)
Addendum  created 03/31/12 1007 by Marni Griffon, CRNA   Modules edited:Charges VN

## 2012-03-31 NOTE — Anesthesia Preprocedure Evaluation (Addendum)
Anesthesia Evaluation  Patient identified by MRN, date of birth, ID band Patient awake    Reviewed: Allergy & Precautions, H&P , NPO status , Patient's Chart, lab work & pertinent test results, reviewed documented beta blocker date and time   Airway Mallampati: I TM Distance: >3 FB Neck ROM: Full    Dental  (+) Edentulous Upper, Edentulous Lower and Dental Advisory Given   Pulmonary neg pulmonary ROS, pneumonia -,    Pulmonary exam normal       Cardiovascular hypertension, Pt. on medications + Past MI     Neuro/Psych CVA    GI/Hepatic Neg liver ROS, GERD-  Medicated,  Endo/Other  diabetes, Well Controlled, Type 2, Insulin Dependent  Renal/GU CRF and DialysisRenal diseaseHD the past year     Musculoskeletal   Abdominal   Peds  Hematology   Anesthesia Other Findings   Reproductive/Obstetrics                         Anesthesia Physical Anesthesia Plan  ASA: III  Anesthesia Plan: MAC   Post-op Pain Management:    Induction: Intravenous  Airway Management Planned: Simple Face Mask  Additional Equipment:   Intra-op Plan:   Post-operative Plan:   Informed Consent: I have reviewed the patients History and Physical, chart, labs and discussed the procedure including the risks, benefits and alternatives for the proposed anesthesia with the patient or authorized representative who has indicated his/her understanding and acceptance.   Dental advisory given  Plan Discussed with: CRNA, Anesthesiologist and Surgeon  Anesthesia Plan Comments:        Anesthesia Quick Evaluation

## 2012-03-31 NOTE — Preoperative (Signed)
Beta Blockers   Reason not to administer Beta Blockers:Not Applicable 

## 2012-03-31 NOTE — Anesthesia Postprocedure Evaluation (Signed)
Anesthesia Post Note  Patient: Charles Vasquez  Procedure(s) Performed: Procedure(s) (LRB): ARTERIOVENOUS (AV) FISTULA CREATION (Right)  Anesthesia type: MAC  Patient location: PACU  Post pain: Pain level controlled  Post assessment: Patient's Cardiovascular Status Stable  Last Vitals:  Filed Vitals:   03/31/12 0945  BP: 150/59  Pulse: 63  Temp:   Resp: 12    Post vital signs: Reviewed and stable  Level of consciousness: sedated  Complications: No apparent anesthesia complications

## 2012-03-31 NOTE — Transfer of Care (Signed)
Immediate Anesthesia Transfer of Care Note  Patient: Charles Vasquez  Procedure(s) Performed: Procedure(s) (LRB) with comments: ARTERIOVENOUS (AV) FISTULA CREATION (Right) - First stage Brachial vein transposition  Patient Location: PACU  Anesthesia Type:MAC  Level of Consciousness: awake  Airway & Oxygen Therapy: Patient Spontanous Breathing and Patient connected to face mask oxygen  Post-op Assessment: Report given to PACU RN, Post -op Vital signs reviewed and stable and Post -op Vital signs reviewed and unstable, Anesthesiologist notified  Post vital signs: Reviewed and stable  Complications: No apparent anesthesia complications

## 2012-04-03 ENCOUNTER — Encounter (HOSPITAL_COMMUNITY): Payer: Self-pay | Admitting: Vascular Surgery

## 2012-04-30 ENCOUNTER — Encounter: Payer: Self-pay | Admitting: Vascular Surgery

## 2012-05-01 ENCOUNTER — Ambulatory Visit (INDEPENDENT_AMBULATORY_CARE_PROVIDER_SITE_OTHER): Payer: Medicare Other | Admitting: Vascular Surgery

## 2012-05-01 ENCOUNTER — Encounter: Payer: Self-pay | Admitting: Vascular Surgery

## 2012-05-01 VITALS — BP 144/77 | HR 68 | Ht 68.0 in | Wt 137.0 lb

## 2012-05-01 NOTE — Progress Notes (Signed)
[facility] [place]  Postoperative Access Visit  History of Present Illness  Charles Vasquez is a 77 y.o. year old male who presents for postoperative follow-up for: R 1st stage brachial vein transposition (Date: 03/31/12).  The patient's wounds are healed.  The patient notes no steal symptoms.  The patient is able to complete their activities of daily living.  The patient's current symptoms are: R arm swelling.  Past Medical History  Diagnosis Date  . Chronic kidney disease   . Stroke   . Myocardial infarction   . Hypertension     takes Amlodipine every other day  . Pneumonia     last yr  . Dizziness   . History of blood transfusion   . Diabetes mellitus without complication     lantus daily and humalog sliding scale    Past Surgical History  Procedure Laterality Date  . Arteriovenous graft placement    . Toe amputation    . Av fistula placement  03/31/2012    Procedure: ARTERIOVENOUS (AV) FISTULA CREATION;  Surgeon: [name], MD;  Location: [facility] OR;  Service: Vascular;  Laterality: Right;  First stage Brachial vein transposition    History   Social History  . Marital Status: Widowed    Spouse Name: N/A    Number of Children: N/A  . Years of Education: N/A   Occupational History  . Not on file.   Social History Main Topics  . Smoking status: Never Smoker   . Smokeless tobacco: Never Used  . Alcohol Use: No  . Drug Use: No  . Sexually Active: Yes   Other Topics Concern  . Not on file   Social History Narrative  . No narrative on file    Family History  Problem Relation Age of Onset  . Diabetes Mother   . Hypertension Mother   . Diabetes Father   . Hypertension Father     Current Outpatient Prescriptions on File Prior to Visit  Medication Sig Dispense Refill  . amLODipine (NORVASC) 10 MG tablet Take 10 mg by mouth daily.      Marland Kitchen aspirin EC 81 MG tablet Take 81 mg by mouth daily.      . calcium carbonate (OS-CAL - DOSED IN MG OF  ELEMENTAL CALCIUM) 1250 MG tablet Take 1 tablet by mouth 2 (two) times daily.      . cholecalciferol (VITAMIN D) 400 UNITS TABS Take 2,000 Units by mouth daily.      Marland Kitchen Dextrose, Diabetic Use, (GLUCOSE PO) Take 1 tablet by mouth once as needed. For diabetic emergencies      . furosemide (LASIX) 40 MG tablet Take 40 mg by mouth 2 (two) times daily.      Marland Kitchen HYDROcodone-acetaminophen (NORCO/VICODIN) 5-325 MG per tablet Take 1 tablet by mouth every 6 (six) hours as needed. For pain      . insulin glargine (LANTUS) 100 UNIT/ML injection Inject 10 Units into the skin at bedtime.      . insulin lispro (HUMALOG) 100 UNIT/ML injection Inject 1 Units into the skin 3 (three) times daily before meals. Sliding scale      . lidocaine-prilocaine (EMLA) cream Apply 1 application topically daily as needed.      . multivitamin (RENA-VIT) TABS tablet Take 1 tablet by mouth daily.      Marland Kitchen oxyCODONE (ROXICODONE) 5 MG immediate release tablet Take 1 tablet (5 mg total) by mouth every 4 (four) hours as needed for pain.  30  tablet  0  . sodium bicarbonate 650 MG tablet Take 650 mg by mouth 2 (two) times daily.       No current facility-administered medications on file prior to visit.    Allergies  Allergen Reactions  . Lisinopril     REACTION: hyperkalemia/renal deterioration  . Other     Lettuce causes blisters    Review of Systems (Positive items checked otherwise negative)  General: [ ]  Weight loss, [ ]  Weight gain, [ ]   Loss of appetite, [ ]  Fever  Neurologic: [ ]  Dizziness, [ ]  Blackouts, [ ]  Headaches, [ ]  Seizure  Ear/Nose/Throat: [ ]  Change in eyesight, [ ]  Change in hearing, [ ]  Nose bleeds, [ ]  Sore throat  Vascular: [ ]  Pain in legs with walking, [ ]  Pain in feet while lying flat, [ ]  Non-healing ulcer, [x]  Stroke, [ ]  "Mini stroke", [ ]  Slurred speech, [ ]  Temporary blindness, [ ]  Blood clot in vein, [ ]  Phlebitis, [x]  R arm swelling  Pulmonary: [ ]  Home oxygen, [ ]  Productive cough, [ ]   Bronchitis, [ ]  Coughing up blood,  [ ]  Asthma, [ ]  Wheezing  Musculoskeletal: [ ]  Arthritis, [ ]  Joint pain, [ ]  Muscle pain  Cardiac: [ ]  Chest pain, [ ]  Chest tightness/pressure, [ ]  Shortness of breath when lying flat, [ ]  Shortness of breath with exertion, [ ]  Palpitations, [ ]  Heart murmur, [ ]  Arrythmia,  [ ]  Atrial fibrillation  Hematologic: [ ]  Bleeding problems, [ ]  Clotting disorder, [ ]  Anemia  Psychiatric:  [ ]  Depression, [ ]  Anxiety, [ ]  Attention deficit disorder  Gastrointestinal:  [ ]  Black stool,[ ]   Blood in stool, [ ]  Peptic ulcer disease, [ ]  Reflux, [ ]  Hiatal hernia, [ ]  Trouble swallowing, [ ]  Diarrhea, [ ]  Constipation  Urinary:  [x]  Kidney disease, [ ]  Burning with urination, [ ]  Frequent urination, [ ]  Difficulty urinating  Skin: [ ]  Ulcers, [ ]  Rashes  Physical Examination  Filed Vitals:   05/01/12 1443  BP: 144/77  Pulse: 68   PULM  CTAB, RIJ TDC in place CV RRR, Nl S1, S2, no M/G RUE  Incision is healed, skin feels warm, hand grip is 4-5/5 due to swelling, sensation in digits is intact, palpable thrill, bruit can be auscultated , R whole arm is swollen  Medical Decision Making  ANDRU GENTER is a 77 y.o. year old male who presents s/p R 1st stage BRVT.  I suspect the RIJ TDC is obturating the outflow for the R 1st stage BRVT.  I recommended moving the [facility] to the LIJ TDC.  The IR studies suggest the LIJ and L innominate veins may be still open.  We will schedule: RIJ TDC removal and LIJ placement for this coming Tuesday 05/05/12 with one my available partners.  Thank you for allowing [place] to participate in this patient's care.  [name], MD [facility] [place] Office: [phone] Pager: [phone]

## 2012-05-04 ENCOUNTER — Encounter (HOSPITAL_COMMUNITY): Payer: Self-pay

## 2012-05-04 ENCOUNTER — Other Ambulatory Visit: Payer: Self-pay

## 2012-05-04 MED ORDER — DEXTROSE 5 % IV SOLN
1.5000 g | INTRAVENOUS | Status: AC
  Administered 2012-05-05: 1.5 g via INTRAVENOUS
  Filled 2012-05-04: qty 1.5

## 2012-05-04 NOTE — Progress Notes (Signed)
05/04/12 1320  OBSTRUCTIVE SLEEP APNEA  Have you ever been diagnosed with sleep apnea through a sleep study? No  Do you snore loudly (loud enough to be heard through closed doors)?  0  Do you often feel tired, fatigued, or sleepy during the daytime? 1  Has anyone observed you stop breathing during your sleep? 0  Do you have, or are you being treated for high blood pressure? 1  BMI more than 35 kg/m2? 0  Age over 75 years old? 1  Neck circumference greater than 40 cm/18 inches? 0 (15 1/2 inches)  Gender: 1  Obstructive Sleep Apnea Score 4  Score 4 or greater  Results sent to PCP

## 2012-05-05 ENCOUNTER — Ambulatory Visit (HOSPITAL_COMMUNITY)
Admission: RE | Admit: 2012-05-05 | Discharge: 2012-05-05 | Disposition: A | Discharge status: Home / Self Care | Payer: Medicare Other | Source: Ambulatory Visit | Attending: Vascular Surgery | Admitting: Vascular Surgery

## 2012-05-05 ENCOUNTER — Encounter (HOSPITAL_COMMUNITY)
Admission: RE | Disposition: A | Discharge status: Home / Self Care | Payer: Self-pay | Source: Ambulatory Visit | Attending: Vascular Surgery

## 2012-05-05 ENCOUNTER — Encounter (HOSPITAL_COMMUNITY): Payer: Self-pay | Admitting: Certified Registered"

## 2012-05-05 ENCOUNTER — Ambulatory Visit (HOSPITAL_COMMUNITY): Payer: Medicare Other | Admitting: Certified Registered"

## 2012-05-05 ENCOUNTER — Ambulatory Visit (HOSPITAL_COMMUNITY): Payer: Medicare Other

## 2012-05-05 ENCOUNTER — Encounter (HOSPITAL_COMMUNITY): Payer: Self-pay | Admitting: Pharmacy Technician

## 2012-05-05 DIAGNOSIS — T82898A Other specified complication of vascular prosthetic devices, implants and grafts, initial encounter: Secondary | ICD-10-CM | POA: Insufficient documentation

## 2012-05-05 DIAGNOSIS — N186 End stage renal disease: Secondary | ICD-10-CM

## 2012-05-05 DIAGNOSIS — M7989 Other specified soft tissue disorders: Secondary | ICD-10-CM | POA: Insufficient documentation

## 2012-05-05 DIAGNOSIS — Z8673 Personal history of transient ischemic attack (TIA), and cerebral infarction without residual deficits: Secondary | ICD-10-CM | POA: Insufficient documentation

## 2012-05-05 DIAGNOSIS — I252 Old myocardial infarction: Secondary | ICD-10-CM | POA: Insufficient documentation

## 2012-05-05 DIAGNOSIS — Z794 Long term (current) use of insulin: Secondary | ICD-10-CM | POA: Insufficient documentation

## 2012-05-05 DIAGNOSIS — E119 Type 2 diabetes mellitus without complications: Secondary | ICD-10-CM | POA: Insufficient documentation

## 2012-05-05 DIAGNOSIS — I120 Hypertensive chronic kidney disease with stage 5 chronic kidney disease or end stage renal disease: Secondary | ICD-10-CM | POA: Insufficient documentation

## 2012-05-05 DIAGNOSIS — K219 Gastro-esophageal reflux disease without esophagitis: Secondary | ICD-10-CM | POA: Insufficient documentation

## 2012-05-05 DIAGNOSIS — Y832 Surgical operation with anastomosis, bypass or graft as the cause of abnormal reaction of the patient, or of later complication, without mention of misadventure at the time of the procedure: Secondary | ICD-10-CM | POA: Insufficient documentation

## 2012-05-05 HISTORY — PX: REMOVAL OF A DIALYSIS CATHETER: SHX6053

## 2012-05-05 HISTORY — PX: INSERTION OF DIALYSIS CATHETER: SHX1324

## 2012-05-05 LAB — SURGICAL PCR SCREEN: Staphylococcus aureus: NEGATIVE

## 2012-05-05 LAB — POCT I-STAT 4, (NA,K, GLUC, HGB,HCT)
Hemoglobin: 12.2 g/dL — ABNORMAL LOW (ref 13.0–17.0)
Sodium: 139 mEq/L (ref 135–145)

## 2012-05-05 SURGERY — INSERTION OF DIALYSIS CATHETER
Anesthesia: Monitor Anesthesia Care | Site: Neck | Laterality: Right | Wound class: Clean

## 2012-05-05 MED ORDER — SODIUM CHLORIDE 0.9 % IV SOLN
INTRAVENOUS | Status: DC
Start: 2012-05-05 — End: 2012-05-05

## 2012-05-05 MED ORDER — PROPOFOL INFUSION 10 MG/ML OPTIME
INTRAVENOUS | Status: DC | PRN
  Administered 2012-05-05: 50 ug/kg/min via INTRAVENOUS

## 2012-05-05 MED ORDER — LIDOCAINE-EPINEPHRINE (PF) 1 %-1:200000 IJ SOLN
INTRAMUSCULAR | Status: AC
  Filled 2012-05-05: qty 10

## 2012-05-05 MED ORDER — LIDOCAINE-EPINEPHRINE (PF) 1 %-1:200000 IJ SOLN
INTRAMUSCULAR | Status: DC | PRN
  Administered 2012-05-05: 30 mL

## 2012-05-05 MED ORDER — HEPARIN SODIUM (PORCINE) 5000 UNIT/ML IJ SOLN
INTRAMUSCULAR | Status: DC | PRN
  Administered 2012-05-05: 09:00:00

## 2012-05-05 MED ORDER — FENTANYL CITRATE 0.05 MG/ML IJ SOLN: 25.0000 ug | INTRAMUSCULAR | Status: DC | PRN

## 2012-05-05 MED ORDER — 0.9 % SODIUM CHLORIDE (POUR BTL) OPTIME
TOPICAL | Status: DC | PRN
  Administered 2012-05-05: 1000 mL

## 2012-05-05 MED ORDER — OXYCODONE-ACETAMINOPHEN 5-325 MG PO TABS: 1.0000 | ORAL_TABLET | ORAL | Status: DC | PRN

## 2012-05-05 MED ORDER — MIDAZOLAM HCL 2 MG/2ML IJ SOLN: 0.5000 mg | Freq: Once | INTRAMUSCULAR | Status: DC | PRN

## 2012-05-05 MED ORDER — HEPARIN SODIUM (PORCINE) 1000 UNIT/ML IJ SOLN
INTRAMUSCULAR | Status: AC
  Filled 2012-05-05: qty 1

## 2012-05-05 MED ORDER — MEPERIDINE HCL 25 MG/ML IJ SOLN: 6.2500 mg | INTRAMUSCULAR | Status: DC | PRN

## 2012-05-05 MED ORDER — OXYCODONE HCL 5 MG PO TABS: 5.0000 mg | ORAL_TABLET | Freq: Once | ORAL | Status: DC | PRN

## 2012-05-05 MED ORDER — OXYCODONE HCL 5 MG/5ML PO SOLN
5.0000 mg | Freq: Once | ORAL | Status: DC | PRN
Start: 2012-05-05 — End: 2012-05-05

## 2012-05-05 MED ORDER — MUPIROCIN 2 % EX OINT
TOPICAL_OINTMENT | Freq: Once | CUTANEOUS | Status: AC
  Administered 2012-05-05: 1 via NASAL
  Filled 2012-05-05: qty 22

## 2012-05-05 MED ORDER — FENTANYL CITRATE 0.05 MG/ML IJ SOLN
INTRAMUSCULAR | Status: DC | PRN
  Administered 2012-05-05: 25 ug via INTRAVENOUS

## 2012-05-05 MED ORDER — SODIUM CHLORIDE 0.9 % IV SOLN
INTRAVENOUS | Status: DC | PRN
  Administered 2012-05-05: 09:00:00 via INTRAVENOUS

## 2012-05-05 MED ORDER — HEPARIN SODIUM (PORCINE) 1000 UNIT/ML IJ SOLN
INTRAMUSCULAR | Status: DC | PRN
  Administered 2012-05-05: 10 mL

## 2012-05-05 MED ORDER — MIDAZOLAM HCL 5 MG/5ML IJ SOLN
INTRAMUSCULAR | Status: DC | PRN
  Administered 2012-05-05: 1 mg via INTRAVENOUS

## 2012-05-05 MED ORDER — PROMETHAZINE HCL 25 MG/ML IJ SOLN: 6.2500 mg | INTRAMUSCULAR | Status: DC | PRN

## 2012-05-05 SURGICAL SUPPLY — 43 items
BAG DECANTER FOR FLEXI CONT (MISCELLANEOUS) ×3 IMPLANT
CATH CANNON HEMO 15F 50CM (CATHETERS) IMPLANT
CATH CANNON HEMO 15FR 19 (HEMODIALYSIS SUPPLIES) IMPLANT
CATH CANNON HEMO 15FR 23CM (HEMODIALYSIS SUPPLIES) IMPLANT
CATH CANNON HEMO 15FR 31CM (HEMODIALYSIS SUPPLIES) IMPLANT
CATH CANNON HEMO 15FR 32CM (HEMODIALYSIS SUPPLIES) ×3 IMPLANT
CHLORAPREP W/TINT 26ML (MISCELLANEOUS) ×3 IMPLANT
CLOTH BEACON ORANGE TIMEOUT ST (SAFETY) ×3 IMPLANT
COVER PROBE W GEL 5X96 (DRAPES) ×3 IMPLANT
COVER SURGICAL LIGHT HANDLE (MISCELLANEOUS) ×3 IMPLANT
DERMABOND ADVANCED (GAUZE/BANDAGES/DRESSINGS) ×1
DERMABOND ADVANCED .7 DNX12 (GAUZE/BANDAGES/DRESSINGS) ×2 IMPLANT
DRAPE CHEST BREAST 15X10 FENES (DRAPES) ×3 IMPLANT
GAUZE SPONGE 2X2 8PLY STRL LF (GAUZE/BANDAGES/DRESSINGS) ×4 IMPLANT
GAUZE SPONGE 4X4 16PLY XRAY LF (GAUZE/BANDAGES/DRESSINGS) ×3 IMPLANT
GLOVE BIO SURGEON STRL SZ7.5 (GLOVE) ×3 IMPLANT
GLOVE BIOGEL PI IND STRL 7.0 (GLOVE) ×2 IMPLANT
GLOVE BIOGEL PI IND STRL 8 (GLOVE) ×2 IMPLANT
GLOVE BIOGEL PI INDICATOR 7.0 (GLOVE) ×1
GLOVE BIOGEL PI INDICATOR 8 (GLOVE) ×1
GLOVE SURG SS PI 6.5 STRL IVOR (GLOVE) ×3 IMPLANT
GOWN STRL NON-REIN LRG LVL3 (GOWN DISPOSABLE) ×6 IMPLANT
KIT BASIN OR (CUSTOM PROCEDURE TRAY) ×3 IMPLANT
KIT ROOM TURNOVER OR (KITS) ×3 IMPLANT
NEEDLE 18GX1X1/2 (RX/OR ONLY) (NEEDLE) ×3 IMPLANT
NEEDLE 22X1 1/2 (OR ONLY) (NEEDLE) ×3 IMPLANT
NEEDLE HYPO 25GX1X1/2 BEV (NEEDLE) ×3 IMPLANT
NS IRRIG 1000ML POUR BTL (IV SOLUTION) ×3 IMPLANT
PACK SURGICAL SETUP 50X90 (CUSTOM PROCEDURE TRAY) ×3 IMPLANT
PAD ARMBOARD 7.5X6 YLW CONV (MISCELLANEOUS) ×6 IMPLANT
SOAP 2 % CHG 4 OZ (WOUND CARE) ×3 IMPLANT
SPONGE GAUZE 2X2 STER 10/PKG (GAUZE/BANDAGES/DRESSINGS) ×2
SUT ETHILON 3 0 PS 1 (SUTURE) ×3 IMPLANT
SUT VICRYL 4-0 PS2 18IN ABS (SUTURE) ×3 IMPLANT
SYR 20CC LL (SYRINGE) ×6 IMPLANT
SYR 30ML LL (SYRINGE) IMPLANT
SYR 5ML LL (SYRINGE) ×6 IMPLANT
SYR CONTROL 10ML LL (SYRINGE) ×3 IMPLANT
SYRINGE 10CC LL (SYRINGE) ×3 IMPLANT
TAPE CLOTH SURG 4X10 WHT LF (GAUZE/BANDAGES/DRESSINGS) ×6 IMPLANT
TOWEL OR 17X24 6PK STRL BLUE (TOWEL DISPOSABLE) ×3 IMPLANT
TOWEL OR 17X26 10 PK STRL BLUE (TOWEL DISPOSABLE) ×3 IMPLANT
WATER STERILE IRR 1000ML POUR (IV SOLUTION) IMPLANT

## 2012-05-05 NOTE — Op Note (Signed)
05/05/2012  PREOP DIAGNOSIS: Chronic kidney disease  POSTOP DIAGNOSIS: Chronic kidney disease  PROCEDURE: Ultrasound guided placement of left IJ Diatek catheter and removal of right IJ dietetic catheter  SURGEON: Di Kindle. Edilia Bo, MD, FACS  ASSIST: none  ANESTHESIA: local with sedation   EBL: minimal  FINDINGS: patent left IJ  INDICATIONS: this patient had a first stage brachial vein transposition in the right arm and developed right arm swelling. I was asked to move his catheter from the right side to the left side.  TECHNIQUE: The patient was taken to the operating room and sedated by anesthesia. The neck and upper chest were prepped and draped in the usual sterile fashion. After the skin was anesthetized with 1% lidocaine, and under ultrasound guidance, the left IJ was cannulated and a guidewire introduced into the superior vena cava under fluoroscopic control. The tract over the wire was dilated and then the dilator and peel-away sheath were passed over the wire and the wire and dilator removed. The catheter was passed through the peel-away sheath and positioned in the right atrium. The exit site for the catheter was selected and the skin anesthetized between the 2 areas. The catheter was then brought through the tunnel, cut to the appropriate length, and the distal ports were attached. Both ports withdrew easily, were then flushed with heparinized saline and filled with concentrated heparin. The catheter was secured at its exit site with a 3-0 nylon suture. The IJ cannulation site was closed with a 4-0 subcuticular stitch.   Next the skin was anesthetized with 1% lidocaine on the right side. Cuff was above the clavicle. A small incision was made at this level and the cuff was dissected free. The catheter was then removed without difficulty and pressure held for hemostasis. Small incision above the clavicle was closed with a 4-0 subcuticular stitch A sterile dressing was applied. The  patient tolerated the procedure well and was transferred to the recovery room in stable condition. All needle and sponge counts were correct.  Waverly Ferrari, MD, FACS [facility] [place]  DATE OF OPERATION: 05/05/2012 DATE OF DICTATION: 05/05/2012

## 2012-05-05 NOTE — Anesthesia Preprocedure Evaluation (Addendum)
Anesthesia Evaluation  Patient identified by MRN, date of birth, ID band Patient awake    Reviewed: Allergy & Precautions, H&P , NPO status , Patient's Chart, lab work & pertinent test results  History of Anesthesia Complications Negative for: history of anesthetic complications  Airway Mallampati: II TM Distance: >3 FB Neck ROM: Full    Dental  (+) Edentulous Upper and Edentulous Lower   Pulmonary neg pulmonary ROS,  breath sounds clear to auscultation  Pulmonary exam normal       Cardiovascular hypertension, Pt. on medications + Past MI (?) Rhythm:Regular Rate:Normal     Neuro/Psych CVA, No Residual Symptoms negative psych ROS   GI/Hepatic Neg liver ROS, GERD-  Controlled,  Endo/Other  diabetes (glu 186), Well Controlled, Type 2, Insulin Dependent  Renal/GU ESRF and DialysisRenal disease (K+ 4.1)     Musculoskeletal   Abdominal   Peds  Hematology   Anesthesia Other Findings   Reproductive/Obstetrics                          Anesthesia Physical Anesthesia Plan  ASA: III  Anesthesia Plan: MAC   Post-op Pain Management:    Induction: Intravenous  Airway Management Planned: Simple Face Mask and Natural Airway  Additional Equipment:   Intra-op Plan:   Post-operative Plan:   Informed Consent: I have reviewed the patients History and Physical, chart, labs and discussed the procedure including the risks, benefits and alternatives for the proposed anesthesia with the patient or authorized representative who has indicated his/her understanding and acceptance.     Plan Discussed with: Surgeon and CRNA  Anesthesia Plan Comments: (Plan routine monitors, MAC)        Anesthesia Quick Evaluation

## 2012-05-05 NOTE — Anesthesia Procedure Notes (Signed)
Procedure Name: MAC Date/Time: 05/05/2012 9:30 AM Performed by: Ellin Goodie Pre-anesthesia Checklist: Patient identified, Emergency Drugs available, Suction available, Patient being monitored and Timeout performed Patient Re-evaluated:Patient Re-evaluated prior to inductionOxygen Delivery Method: Simple face mask Preoxygenation: Pre-oxygenation with 100% oxygen Intubation Type: IV induction Placement Confirmation: positive ETCO2 and breath sounds checked- equal and bilateral Dental Injury: Teeth and Oropharynx as per pre-operative assessment

## 2012-05-05 NOTE — Transfer of Care (Signed)
Immediate Anesthesia Transfer of Care Note  Patient: Charles Vasquez  Procedure(s) Performed: Procedure(s): INSERTION OF DIALYSIS CATHETER (Left) REMOVAL OF A DIALYSIS CATHETER (Right)  Patient Location: PACU  Anesthesia Type:MAC  Level of Consciousness: sedated  Airway & Oxygen Therapy: Patient connected to face mask  Post-op Assessment: Report given to PACU RN  Post vital signs: stable  Complications: No apparent anesthesia complications

## 2012-05-05 NOTE — Preoperative (Signed)
Beta Blockers   Reason not to administer Beta Blockers:Not Applicable 

## 2012-05-05 NOTE — Interval H&P Note (Signed)
History and Physical Interval Note:  05/05/2012 8:54 AM  Charles Vasquez  has presented today for surgery, with the diagnosis of End Stage Renal Disease  The various methods of treatment have been discussed with the patient and family. After consideration of risks, benefits and other options for treatment, the patient has consented to  Procedure(s): INSERTION OF DIALYSIS CATHETER (Left) REMOVAL OF A DIALYSIS CATHETER (Right) as a surgical intervention .  The patient's history has been reviewed, patient examined, no change in status, stable for surgery.  I have reviewed the patient's chart and labs.  Questions were answered to the patient's satisfaction.     Oneil Behney S

## 2012-05-05 NOTE — H&P (View-Only) (Signed)
[facility]   Postoperative Access Visit  History of Present Illness  Charles Vasquez is a 76 y.o. year old male who presents for postoperative follow-up for: R 1st stage brachial vein transposition (Date: 03/31/12).  The patient's wounds are healed.  The patient notes no steal symptoms.  The patient is able to complete their activities of daily living.  The patient's current symptoms are: R arm swelling.  Past Medical History  Diagnosis Date  . Chronic kidney disease   . Stroke   . Myocardial infarction   . Hypertension     takes Amlodipine every other day  . Pneumonia     last yr  . Dizziness   . History of blood transfusion   . Diabetes mellitus without complication     lantus daily and humalog sliding scale    Past Surgical History  Procedure Laterality Date  . Arteriovenous graft placement    . Toe amputation    . Av fistula placement  03/31/2012    Procedure: ARTERIOVENOUS (AV) FISTULA CREATION;  Surgeon: Ouida Abeyta L Kanita Delage, MD;  Location: [facility] OR;  Service: Vascular;  Laterality: Right;  First stage Brachial vein transposition    History   Social History  . Marital Status: Widowed    Spouse Name: N/A    Number of Children: N/A  . Years of Education: N/A   Occupational History  . Not on file.   Social History Main Topics  . Smoking status: Never Smoker   . Smokeless tobacco: Never Used  . Alcohol Use: No  . Drug Use: No  . Sexually Active: Yes   Other Topics Concern  . Not on file   Social History Narrative  . No narrative on file    Family History  Problem Relation Age of Onset  . Diabetes Mother   . Hypertension Mother   . Diabetes Father   . Hypertension Father     Current Outpatient Prescriptions on File Prior to Visit  Medication Sig Dispense Refill  . amLODipine (NORVASC) 10 MG tablet Take 10 mg by mouth daily.      . aspirin EC 81 MG tablet Take 81 mg by mouth daily.      . calcium carbonate (OS-CAL - DOSED IN MG OF  ELEMENTAL CALCIUM) 1250 MG tablet Take 1 tablet by mouth 2 (two) times daily.      . cholecalciferol (VITAMIN D) 400 UNITS TABS Take 2,000 Units by mouth daily.      . Dextrose, Diabetic Use, (GLUCOSE PO) Take 1 tablet by mouth once as needed. For diabetic emergencies      . furosemide (LASIX) 40 MG tablet Take 40 mg by mouth 2 (two) times daily.      . HYDROcodone-acetaminophen (NORCO/VICODIN) 5-325 MG per tablet Take 1 tablet by mouth every 6 (six) hours as needed. For pain      . insulin glargine (LANTUS) 100 UNIT/ML injection Inject 10 Units into the skin at bedtime.      . insulin lispro (HUMALOG) 100 UNIT/ML injection Inject 1 Units into the skin 3 (three) times daily before meals. Sliding scale      . lidocaine-prilocaine (EMLA) cream Apply 1 application topically daily as needed.      . multivitamin (RENA-VIT) TABS tablet Take 1 tablet by mouth daily.      . oxyCODONE (ROXICODONE) 5 MG immediate release tablet Take 1 tablet (5 mg total) by mouth every 4 (four) hours as needed for pain.  30   tablet  0  . sodium bicarbonate 650 MG tablet Take 650 mg by mouth 2 (two) times daily.       No current facility-administered medications on file prior to visit.    Allergies  Allergen Reactions  . Lisinopril     REACTION: hyperkalemia/renal deterioration  . Other     Lettuce causes blisters    Review of Systems (Positive items checked otherwise negative)  General: [ ] Weight loss, [ ] Weight gain, [ ]  Loss of appetite, [ ] Fever  Neurologic: [ ] Dizziness, [ ] Blackouts, [ ] Headaches, [ ] Seizure  Ear/Nose/Throat: [ ] Change in eyesight, [ ] Change in hearing, [ ] Nose bleeds, [ ] Sore throat  Vascular: [ ] Pain in legs with walking, [ ] Pain in feet while lying flat, [ ] Non-healing ulcer, [x] Stroke, [ ] "Mini stroke", [ ] Slurred speech, [ ] Temporary blindness, [ ] Blood clot in vein, [ ] Phlebitis, [x] R arm swelling  Pulmonary: [ ] Home oxygen, [ ] Productive cough, [ ]  Bronchitis, [ ] Coughing up blood,  [ ] Asthma, [ ] Wheezing  Musculoskeletal: [ ] Arthritis, [ ] Joint pain, [ ] Muscle pain  Cardiac: [ ] Chest pain, [ ] Chest tightness/pressure, [ ] Shortness of breath when lying flat, [ ] Shortness of breath with exertion, [ ] Palpitations, [ ] Heart murmur, [ ] Arrythmia,  [ ] Atrial fibrillation  Hematologic: [ ] Bleeding problems, [ ] Clotting disorder, [ ] Anemia  Psychiatric:  [ ] Depression, [ ] Anxiety, [ ] Attention deficit disorder  Gastrointestinal:  [ ] Black stool,[ ]  Blood in stool, [ ] Peptic ulcer disease, [ ] Reflux, [ ] Hiatal hernia, [ ] Trouble swallowing, [ ] Diarrhea, [ ] Constipation  Urinary:  [x] Kidney disease, [ ] Burning with urination, [ ] Frequent urination, [ ] Difficulty urinating  Skin: [ ] Ulcers, [ ] Rashes  Physical Examination  Filed Vitals:   05/01/12 1443  BP: 144/77  Pulse: 68   PULM  CTAB, RIJ TDC in place CV RRR, Nl S1, S2, no M/G RUE  Incision is healed, skin feels warm, hand grip is 4-5/5 due to swelling, sensation in digits is intact, palpable thrill, bruit can be auscultated , R whole arm is swollen  Medical Decision Making  Charles Vasquez is a 76 y.o. year old male who presents s/p R 1st stage BRVT.  I suspect the RIJ TDC is obturating the outflow for the R 1st stage BRVT.  I recommended moving the TDC to the LIJ TDC.  The IR studies suggest the LIJ and L innominate veins may be still open.  We will schedule: RIJ TDC removal and LIJ placement for this coming Tuesday 05/05/12 with one my available partners.  Thank you for allowing us to participate in this patient's care.  Otoniel Myhand, MD [facility]  Office: [phone] Pager: [phone]   

## 2012-05-05 NOTE — Progress Notes (Signed)
RUE swelling noted---it was pre op. Pt state right fingers "feel better". Good movement and sensation. RUE elev on a pillow.

## 2012-05-05 NOTE — Anesthesia Postprocedure Evaluation (Signed)
  Anesthesia Post-op Note  Patient: Charles Vasquez  Procedure(s) Performed: Procedure(s): INSERTION OF DIALYSIS CATHETER (Left) REMOVAL OF A DIALYSIS CATHETER (Right)  Patient Location: PACU  Anesthesia Type:MAC  Level of Consciousness: awake, alert , oriented and patient cooperative  Airway and Oxygen Therapy: Patient Spontanous Breathing  Post-op Pain: none  Post-op Assessment: Post-op Vital signs reviewed, Patient's Cardiovascular Status Stable, Respiratory Function Stable, Patent Airway, No signs of Nausea or vomiting and Pain level controlled  Post-op Vital Signs: Reviewed and stable  Complications: No apparent anesthesia complications

## 2012-05-07 ENCOUNTER — Encounter (HOSPITAL_COMMUNITY): Payer: Self-pay | Admitting: Vascular Surgery

## 2012-05-11 ENCOUNTER — Telehealth: Payer: Self-pay | Admitting: Vascular Surgery

## 2012-05-11 ENCOUNTER — Other Ambulatory Visit: Payer: Self-pay | Admitting: *Deleted

## 2012-05-11 ENCOUNTER — Telehealth: Payer: Self-pay | Admitting: *Deleted

## 2012-05-11 DIAGNOSIS — N186 End stage renal disease: Secondary | ICD-10-CM

## 2012-05-11 NOTE — Telephone Encounter (Signed)
Message copied by Melene Plan on Mon May 11, 2012 12:13 PM ------      Message from: [name]      Created: Mon May 11, 2012  7:30 AM       He needs to follow-up 4 weeks after last appt for formal access duplex.  This is a STAGED BVT so it can't be used yet            ----- Message -----         From: Melene Plan, RN         Sent: 05/08/2012   3:01 PM           To: [name], MD            The HD center is calling to see when they can use the Right BVT access. SHe said the swelling had gone done since [facility] moved.I didn't know if we needed to set up a 2nd stage.      Thanks      Darel Hong       ------

## 2012-05-11 NOTE — Telephone Encounter (Addendum)
Message copied by Shari Prows on Mon May 11, 2012  2:16 PM ------      Message from: Melene Plan      Created: Mon May 11, 2012 10:21 AM                   ----- Message -----         From: [name], MD         Sent: 05/11/2012   7:30 AM           To: Melene Plan, RN            He needs to follow-up 4 weeks after last appt for formal access duplex.  This is a STAGED BVT so it can't be used yet            ----- Message -----         From: Melene Plan, RN         Sent: 05/08/2012   3:01 PM           To: [name], MD            The HD center is calling to see when they can use the Right BVT access. SHe said the swelling had gone done since [facility] moved.I didn't know if we needed to set up a 2nd stage.      Thanks      Darel Hong       ------I scheduled an appt for the above pt on 06/05/12 at 2pm. I was unable to leave a phone message for the pt but did mail an appt letter.awt

## 2012-05-11 NOTE — Telephone Encounter (Signed)
The front office staff is making Charles Vasquez an appt. This note was faxed to [facility] at [phone] Attn:Kathy/jjk

## 2012-06-05 ENCOUNTER — Ambulatory Visit: Payer: Medicare Other | Admitting: Vascular Surgery

## 2012-06-11 ENCOUNTER — Encounter: Payer: Self-pay | Admitting: Vascular Surgery

## 2012-06-12 ENCOUNTER — Encounter (INDEPENDENT_AMBULATORY_CARE_PROVIDER_SITE_OTHER): Payer: Medicare Other | Admitting: *Deleted

## 2012-06-12 ENCOUNTER — Encounter: Payer: Self-pay | Admitting: Vascular Surgery

## 2012-06-12 ENCOUNTER — Ambulatory Visit (INDEPENDENT_AMBULATORY_CARE_PROVIDER_SITE_OTHER): Payer: Medicare Other | Admitting: Vascular Surgery

## 2012-06-12 VITALS — BP 111/75 | HR 62 | Resp 16 | Ht 68.0 in | Wt 135.0 lb

## 2012-06-12 DIAGNOSIS — N186 End stage renal disease: Secondary | ICD-10-CM

## 2012-06-12 DIAGNOSIS — T82598A Other mechanical complication of other cardiac and vascular devices and implants, initial encounter: Secondary | ICD-10-CM

## 2012-06-12 DIAGNOSIS — Z4931 Encounter for adequacy testing for hemodialysis: Secondary | ICD-10-CM

## 2012-06-12 NOTE — Progress Notes (Signed)
[facility] [place]  Postoperative Access Visit  History of Present Illness  Charles Vasquez is a 77 y.o. year old male who presents for postoperative follow-up for: R 1st stage BVT (Date: 03/31/12).  Pt had RIJ TDC removed an LIJ TDC placed (05/05/12) due to outflow obstruction by the [facility].  The patient's wounds are healed.  The patient notes no steal symptoms.  The patient is able to complete their activities of daily living.  The patient's current symptoms are: none.  His prior R arm swelling has resolved.    Physical Examination  Filed Vitals:   06/12/12 1558  BP: 111/75  Pulse: 62  Resp: 16   RUE: Incision is healed, skin feels warm, hand grip is 5/5, sensation in digits is intact, palpable thrill, bruit can be auscultated   R arm access duplex (Date: 06/12/12)  Arterial anastomosis: 318 c/s  Diameters: 1.4-4.3 mm  Medical Decision Making  Charles Vasquez is a 77 y.o. year old male who presents s/p R 1st stage BVT.  We discussed proceeding with LUA AVG placement vs. waiting one more month to see if the fistula will mature.  He would like to wait one more month before making a decision.  Thank you for allowing [place] to participate in this patient's care.  [name], MD [facility] [place] Office: [phone] Pager: [phone]

## 2012-07-17 ENCOUNTER — Ambulatory Visit: Payer: Medicare Other | Admitting: Vascular Surgery

## 2012-08-06 ENCOUNTER — Encounter: Payer: Self-pay | Admitting: Vascular Surgery

## 2012-08-07 ENCOUNTER — Encounter: Payer: Self-pay | Admitting: Vascular Surgery

## 2012-08-07 ENCOUNTER — Ambulatory Visit (INDEPENDENT_AMBULATORY_CARE_PROVIDER_SITE_OTHER): Payer: Medicare Other | Admitting: Vascular Surgery

## 2012-08-07 VITALS — BP 107/62 | HR 69 | Ht 68.0 in | Wt 136.0 lb

## 2012-08-07 DIAGNOSIS — N186 End stage renal disease: Secondary | ICD-10-CM

## 2012-08-07 NOTE — Progress Notes (Signed)
[facility] [place]  Postoperative Access Visit  History of Present Illness  Charles Vasquez is a 77 y.o. year old male who presents for postoperative follow-up for: R 1st BrVT (Date: 03/31/12).  The patient's wounds are healed.  The patient notes no steal symptoms.  The patient is able to complete their activities of daily living.  The patient's current symptoms are: none.  The pt's previous R arm swelling due to venous outflow resolved with moving the TDC to LIJV.  The pt continues to dialyze from [facility].  Past Medical History  Diagnosis Date  . Chronic kidney disease   . Stroke   . Myocardial infarction   . Hypertension     takes Amlodipine every other day  . Pneumonia     last yr  . Dizziness   . History of blood transfusion   . Diabetes mellitus without complication     lantus daily and humalog sliding scale    Past Surgical History  Procedure Laterality Date  . Arteriovenous graft placement    . Toe amputation    . Av fistula placement  03/31/2012    Procedure: ARTERIOVENOUS (AV) FISTULA CREATION;  Surgeon: [name], MD;  Location: [facility] OR;  Service: Vascular;  Laterality: Right;  First stage Brachial vein transposition  . Insertion of dialysis catheter Left 05/05/2012    Procedure: INSERTION OF DIALYSIS CATHETER;  Surgeon: Chuck Hint, MD;  Location: [facility] OR;  Service: Vascular;  Laterality: Left;  . Removal of a dialysis catheter Right 05/05/2012    Procedure: REMOVAL OF A DIALYSIS CATHETER;  Surgeon: Chuck Hint, MD;  Location: [facility] OR;  Service: Vascular;  Laterality: Right;    History   Social History  . Marital Status: Widowed    Spouse Name: N/A    Number of Children: N/A  . Years of Education: N/A   Occupational History  . Not on file.   Social History Main Topics  . Smoking status: Never Smoker   . Smokeless tobacco: Never Used  . Alcohol Use: No  . Drug Use: No  . Sexually Active: Yes   Other Topics Concern  .  Not on file   Social History Narrative  . No narrative on file    Family History  Problem Relation Age of Onset  . Diabetes Mother   . Hypertension Mother   . Diabetes Father   . Hypertension Father     Current Outpatient Prescriptions on File Prior to Visit  Medication Sig Dispense Refill  . amLODipine (NORVASC) 5 MG tablet Take 5 mg by mouth daily.      Marland Kitchen aspirin EC 81 MG tablet Take 81 mg by mouth daily.      . calcium carbonate (OS-CAL - DOSED IN MG OF ELEMENTAL CALCIUM) 1250 MG tablet Take 1 tablet by mouth 2 (two) times daily.      . cholecalciferol (VITAMIN D) 1000 UNITS tablet Take 1,000 Units by mouth daily.      Marland Kitchen Dextrose, Diabetic Use, (GLUCOSE PO) Take 1 tablet by mouth once as needed. For diabetic emergencies      . furosemide (LASIX) 40 MG tablet Take 40 mg by mouth 2 (two) times daily.      . insulin glargine (LANTUS) 100 UNIT/ML injection Inject 10 Units into the skin at bedtime.      . lidocaine-prilocaine (EMLA) cream Apply 1 application topically every Monday, Wednesday, and Friday with hemodialysis.       Marland Kitchen  multivitamin (RENA-VIT) TABS tablet Take 1 tablet by mouth daily.      Marland Kitchen oxyCODONE-acetaminophen (ROXICET) 5-325 MG per tablet Take 1-2 tablets by mouth every 4 (four) hours as needed for pain.  20 tablet  0   No current facility-administered medications on file prior to visit.    Allergies  Allergen Reactions  . Lisinopril     REACTION: hyperkalemia/renal deterioration  . Other     Lettuce causes blisters    Review of Systems (Positive items checked otherwise negative)  General: [ ]  Weight loss, [ ]  Weight gain, [ ]   Loss of appetite, [ ]  Fever  Neurologic: [ ]  Dizziness, [ ]  Blackouts, [ ]  Headaches, [ ]  Seizure  Ear/Nose/Throat: [ ]  Change in eyesight, [ ]  Change in hearing, [ ]  Nose bleeds, [ ]  Sore throat  Vascular: [ ]  Pain in legs with walking, [ ]  Pain in feet while lying flat, [ ]  Non-healing ulcer, Stroke, [ ]  "Mini stroke", [ ]  Slurred  speech, [ ]  Temporary blindness, [ ]  Blood clot in vein, [ ]  Phlebitis  Pulmonary: [ ]  Home oxygen, [ ]  Productive cough, [ ]  Bronchitis, [ ]  Coughing up blood,  [ ]  Asthma, [ ]  Wheezing  Musculoskeletal: [ ]  Arthritis, [ ]  Joint pain, [ ]  Muscle pain  Cardiac: [ ]  Chest pain, [ ]  Chest tightness/pressure, [ ]  Shortness of breath when lying flat, [ ]  Shortness of breath with exertion, [ ]  Palpitations, [ ]  Heart murmur, [ ]  Arrythmia,  [ ]  Atrial fibrillation  Hematologic: [ ]  Bleeding problems, [ ]  Clotting disorder, [ ]  Anemia  Psychiatric:  [ ]  Depression, [ ]  Anxiety, [ ]  Attention deficit disorder  Gastrointestinal:  [ ]  Black stool,[ ]   Blood in stool, [ ]  Peptic ulcer disease, [ ]  Reflux, [ ]  Hiatal hernia, [ ]  Trouble swallowing, [ ]  Diarrhea, [ ]  Constipation  Urinary:  [x]  Kidney disease, [ ]  Burning with urination, [ ]  Frequent urination, [ ]  Difficulty urinating  Skin: [ ]  Ulcers, [ ]  Rashes  Physical Examination  Filed Vitals:   08/07/12 1503  BP: 107/62  Pulse: 69   GEN: WDWN, A&O x 3  PULM: CTAB, sym exp, good air movement  CV:  RRR, Nl s1,s2, no M/G  ABD:  soft, NTND  RUE:  Incision is healed, skin feels warm, hand grip is 5/5, sensation in digits is intact, palpable thrill, bruit can be auscultated , on Sonosite: distal fistula only 4.5 mm maximum for ~5 cm, ~ mid-arm brachial vein dilates to 7-8 mm  Medical Decision Making  Charles Vasquez is a 77 y.o. year old male who presents s/p R 1st stage BrVT.  After ~4 months, the patient's distal brachial vein is not adequate, so I doubt it will mature with additional time.    I recommend: ligation of R brachial vein transposition and placement of RUA AVG.  Thank you for allowing [place] to participate in this patient's care.  [name], MD [facility] [place] Office: [phone] Pager: [phone]

## 2012-08-11 ENCOUNTER — Other Ambulatory Visit: Payer: Self-pay

## 2012-08-24 ENCOUNTER — Encounter (HOSPITAL_COMMUNITY): Payer: Self-pay | Admitting: *Deleted

## 2012-08-24 MED ORDER — DEXTROSE 5 % IV SOLN
1.5000 g | INTRAVENOUS | Status: AC
  Administered 2012-08-25: 1.5 g via INTRAVENOUS
  Filled 2012-08-24: qty 1.5

## 2012-08-25 ENCOUNTER — Telehealth: Payer: Self-pay | Admitting: Vascular Surgery

## 2012-08-25 ENCOUNTER — Encounter (HOSPITAL_COMMUNITY)
Admission: RE | Disposition: A | Discharge status: Home / Self Care | Payer: Self-pay | Source: Ambulatory Visit | Attending: Vascular Surgery

## 2012-08-25 ENCOUNTER — Ambulatory Visit (HOSPITAL_COMMUNITY): Payer: Medicare Other | Admitting: Anesthesiology

## 2012-08-25 ENCOUNTER — Encounter (HOSPITAL_COMMUNITY): Payer: Self-pay | Admitting: Anesthesiology

## 2012-08-25 ENCOUNTER — Ambulatory Visit (HOSPITAL_COMMUNITY)
Admission: RE | Admit: 2012-08-25 | Discharge: 2012-08-25 | Disposition: A | Discharge status: Home / Self Care | Payer: Medicare Other | Source: Ambulatory Visit | Attending: Vascular Surgery | Admitting: Vascular Surgery

## 2012-08-25 DIAGNOSIS — I120 Hypertensive chronic kidney disease with stage 5 chronic kidney disease or end stage renal disease: Secondary | ICD-10-CM | POA: Insufficient documentation

## 2012-08-25 DIAGNOSIS — Z7982 Long term (current) use of aspirin: Secondary | ICD-10-CM | POA: Insufficient documentation

## 2012-08-25 DIAGNOSIS — Y832 Surgical operation with anastomosis, bypass or graft as the cause of abnormal reaction of the patient, or of later complication, without mention of misadventure at the time of the procedure: Secondary | ICD-10-CM | POA: Insufficient documentation

## 2012-08-25 DIAGNOSIS — T82598A Other mechanical complication of other cardiac and vascular devices and implants, initial encounter: Secondary | ICD-10-CM | POA: Insufficient documentation

## 2012-08-25 DIAGNOSIS — N186 End stage renal disease: Secondary | ICD-10-CM | POA: Insufficient documentation

## 2012-08-25 DIAGNOSIS — Z8673 Personal history of transient ischemic attack (TIA), and cerebral infarction without residual deficits: Secondary | ICD-10-CM | POA: Insufficient documentation

## 2012-08-25 DIAGNOSIS — Z794 Long term (current) use of insulin: Secondary | ICD-10-CM | POA: Insufficient documentation

## 2012-08-25 DIAGNOSIS — E119 Type 2 diabetes mellitus without complications: Secondary | ICD-10-CM | POA: Insufficient documentation

## 2012-08-25 DIAGNOSIS — I252 Old myocardial infarction: Secondary | ICD-10-CM | POA: Insufficient documentation

## 2012-08-25 DIAGNOSIS — Z79899 Other long term (current) drug therapy: Secondary | ICD-10-CM | POA: Insufficient documentation

## 2012-08-25 DIAGNOSIS — Z91018 Allergy to other foods: Secondary | ICD-10-CM | POA: Insufficient documentation

## 2012-08-25 DIAGNOSIS — Z888 Allergy status to other drugs, medicaments and biological substances status: Secondary | ICD-10-CM | POA: Insufficient documentation

## 2012-08-25 HISTORY — PX: AV FISTULA PLACEMENT: SHX1204

## 2012-08-25 HISTORY — PX: LIGATION OF ARTERIOVENOUS  FISTULA: SHX5948

## 2012-08-25 LAB — POCT I-STAT 4, (NA,K, GLUC, HGB,HCT)
Glucose, Bld: 178 mg/dL — ABNORMAL HIGH (ref 70–99)
HCT: 41 % (ref 39.0–52.0)
Hemoglobin: 13.9 g/dL (ref 13.0–17.0)
Potassium: 4 mEq/L (ref 3.5–5.1)

## 2012-08-25 LAB — GLUCOSE, CAPILLARY
Glucose-Capillary: 139 mg/dL — ABNORMAL HIGH (ref 70–99)
Glucose-Capillary: 130 mg/dL — ABNORMAL HIGH (ref 70–99)

## 2012-08-25 SURGERY — INSERTION OF ARTERIOVENOUS (AV) GORE-TEX GRAFT ARM
Anesthesia: General | Site: Arm Upper | Laterality: Right | Wound class: Clean

## 2012-08-25 MED ORDER — THROMBIN 20000 UNITS EX KIT
PACK | CUTANEOUS | Status: DC | PRN
  Administered 2012-08-25: 20000 [IU] via TOPICAL

## 2012-08-25 MED ORDER — THROMBIN 20000 UNITS EX SOLR
CUTANEOUS | Status: AC
  Filled 2012-08-25: qty 20000

## 2012-08-25 MED ORDER — 0.9 % SODIUM CHLORIDE (POUR BTL) OPTIME
TOPICAL | Status: DC | PRN
  Administered 2012-08-25: 1000 mL

## 2012-08-25 MED ORDER — THROMBIN 20000 UNITS EX KIT
PACK | CUTANEOUS | Status: DC | PRN
  Administered 2012-08-25: 13:00:00 via TOPICAL

## 2012-08-25 MED ORDER — SODIUM CHLORIDE 0.9 % IR SOLN
Status: DC | PRN
  Administered 2012-08-25: 13:00:00

## 2012-08-25 MED ORDER — MUPIROCIN 2 % EX OINT
TOPICAL_OINTMENT | Freq: Two times a day (BID) | CUTANEOUS | Status: DC
  Administered 2012-08-25: 1 via NASAL

## 2012-08-25 MED ORDER — HEPARIN SODIUM (PORCINE) 1000 UNIT/ML IJ SOLN
INTRAMUSCULAR | Status: DC | PRN
  Administered 2012-08-25: 5000 [IU] via INTRAVENOUS

## 2012-08-25 MED ORDER — EPHEDRINE SULFATE 50 MG/ML IJ SOLN
INTRAMUSCULAR | Status: DC | PRN
  Administered 2012-08-25 (×2): 10 mg via INTRAVENOUS

## 2012-08-25 MED ORDER — OXYCODONE-ACETAMINOPHEN 5-325 MG PO TABS: 1.0000 | ORAL_TABLET | Freq: Four times a day (QID) | ORAL | Status: DC | PRN

## 2012-08-25 MED ORDER — PROTAMINE SULFATE 10 MG/ML IV SOLN
INTRAVENOUS | Status: DC | PRN
  Administered 2012-08-25: 15 mg via INTRAVENOUS

## 2012-08-25 MED ORDER — PROPOFOL 10 MG/ML IV BOLUS
INTRAVENOUS | Status: DC | PRN
  Administered 2012-08-25: 110 mg via INTRAVENOUS

## 2012-08-25 MED ORDER — BUPIVACAINE HCL 0.5 % IJ SOLN
INTRAMUSCULAR | Status: DC | PRN
  Administered 2012-08-25: 10 mL

## 2012-08-25 MED ORDER — BUPIVACAINE HCL (PF) 0.5 % IJ SOLN
INTRAMUSCULAR | Status: AC
  Filled 2012-08-25: qty 30

## 2012-08-25 MED ORDER — GLYCOPYRROLATE 0.2 MG/ML IJ SOLN
INTRAMUSCULAR | Status: DC | PRN
  Administered 2012-08-25: 0.4 mg via INTRAVENOUS

## 2012-08-25 MED ORDER — OXYCODONE HCL 5 MG/5ML PO SOLN: 5.0000 mg | Freq: Once | ORAL | Status: DC | PRN

## 2012-08-25 MED ORDER — PHENYLEPHRINE HCL 10 MG/ML IJ SOLN
INTRAMUSCULAR | Status: DC | PRN
  Administered 2012-08-25 (×2): 80 ug via INTRAVENOUS

## 2012-08-25 MED ORDER — FENTANYL CITRATE 0.05 MG/ML IJ SOLN
INTRAMUSCULAR | Status: DC | PRN
  Administered 2012-08-25: 75 ug via INTRAVENOUS

## 2012-08-25 MED ORDER — MUPIROCIN 2 % EX OINT
TOPICAL_OINTMENT | CUTANEOUS | Status: AC
  Filled 2012-08-25: qty 22

## 2012-08-25 MED ORDER — SODIUM CHLORIDE 0.9 % IV SOLN
INTRAVENOUS | Status: DC
  Administered 2012-08-25: 11:00:00 via INTRAVENOUS

## 2012-08-25 MED ORDER — OXYCODONE HCL 5 MG PO TABS: 5.0000 mg | ORAL_TABLET | Freq: Once | ORAL | Status: DC | PRN

## 2012-08-25 MED ORDER — LIDOCAINE-EPINEPHRINE (PF) 1 %-1:200000 IJ SOLN
INTRAMUSCULAR | Status: DC | PRN
  Administered 2012-08-25: 10 mL

## 2012-08-25 MED ORDER — LIDOCAINE HCL (CARDIAC) 20 MG/ML IV SOLN
INTRAVENOUS | Status: DC | PRN
  Administered 2012-08-25: 80 mg via INTRAVENOUS

## 2012-08-25 MED ORDER — FENTANYL CITRATE 0.05 MG/ML IJ SOLN: 25.0000 ug | INTRAMUSCULAR | Status: DC | PRN

## 2012-08-25 MED ORDER — ONDANSETRON HCL 4 MG/2ML IJ SOLN
INTRAMUSCULAR | Status: DC | PRN
  Administered 2012-08-25: 4 mg via INTRAVENOUS

## 2012-08-25 MED ORDER — LIDOCAINE-EPINEPHRINE 1 %-1:100000 IJ SOLN
INTRAMUSCULAR | Status: AC
  Filled 2012-08-25: qty 1

## 2012-08-25 SURGICAL SUPPLY — 44 items
ARMBAND PINK RESTRICT EXTREMIT (MISCELLANEOUS) ×2 IMPLANT
CANISTER SUCTION 2500CC (MISCELLANEOUS) ×2 IMPLANT
CLIP TI MEDIUM 6 (CLIP) ×2 IMPLANT
CLIP TI WIDE RED SMALL 6 (CLIP) ×2 IMPLANT
CLOTH BEACON ORANGE TIMEOUT ST (SAFETY) ×2 IMPLANT
COVER PROBE W GEL 5X96 (DRAPES) ×2 IMPLANT
COVER SURGICAL LIGHT HANDLE (MISCELLANEOUS) ×2 IMPLANT
DECANTER SPIKE VIAL GLASS SM (MISCELLANEOUS) ×2 IMPLANT
DERMABOND ADVANCED (GAUZE/BANDAGES/DRESSINGS) ×1
DERMABOND ADVANCED .7 DNX12 (GAUZE/BANDAGES/DRESSINGS) ×1 IMPLANT
ELECT REM PT RETURN 9FT ADLT (ELECTROSURGICAL) ×2
ELECTRODE REM PT RTRN 9FT ADLT (ELECTROSURGICAL) ×1 IMPLANT
GEL ULTRASOUND 20GR AQUASONIC (MISCELLANEOUS) IMPLANT
GLOVE BIO SURGEON STRL SZ7 (GLOVE) ×2 IMPLANT
GLOVE BIO SURGEON STRL SZ7.5 (GLOVE) ×2 IMPLANT
GLOVE BIOGEL M STRL SZ7.5 (GLOVE) ×2 IMPLANT
GLOVE BIOGEL PI IND STRL 7.5 (GLOVE) ×2 IMPLANT
GLOVE BIOGEL PI INDICATOR 7.5 (GLOVE) ×2
GLOVE SS BIOGEL STRL SZ 7 (GLOVE) ×1 IMPLANT
GLOVE SUPERSENSE BIOGEL SZ 7 (GLOVE) ×1
GOWN STRL NON-REIN LRG LVL3 (GOWN DISPOSABLE) ×12 IMPLANT
GRAFT GORETEX STRT 4-7X45 (Vascular Products) ×2 IMPLANT
KIT BASIN OR (CUSTOM PROCEDURE TRAY) ×2 IMPLANT
KIT ROOM TURNOVER OR (KITS) ×2 IMPLANT
NS IRRIG 1000ML POUR BTL (IV SOLUTION) ×2 IMPLANT
PACK CV ACCESS (CUSTOM PROCEDURE TRAY) ×2 IMPLANT
PAD ARMBOARD 7.5X6 YLW CONV (MISCELLANEOUS) ×4 IMPLANT
SPONGE GAUZE 4X4 12PLY (GAUZE/BANDAGES/DRESSINGS) ×2 IMPLANT
SPONGE SURGIFOAM ABS GEL 100 (HEMOSTASIS) IMPLANT
SUT ETHILON 3 0 PS 1 (SUTURE) IMPLANT
SUT MNCRL AB 4-0 PS2 18 (SUTURE) ×4 IMPLANT
SUT PROLENE 5 0 C 1 24 (SUTURE) IMPLANT
SUT PROLENE 6 0 BV (SUTURE) ×10 IMPLANT
SUT PROLENE 7 0 BV 1 (SUTURE) IMPLANT
SUT SILK 0 TIES 10X30 (SUTURE) ×2 IMPLANT
SUT SILK 2 0 FS (SUTURE) ×2 IMPLANT
SUT VIC AB 3-0 SH 27 (SUTURE) ×2
SUT VIC AB 3-0 SH 27X BRD (SUTURE) ×2 IMPLANT
SWAB COLLECTION DEVICE MRSA (MISCELLANEOUS) IMPLANT
TOWEL OR 17X24 6PK STRL BLUE (TOWEL DISPOSABLE) ×2 IMPLANT
TOWEL OR 17X26 10 PK STRL BLUE (TOWEL DISPOSABLE) ×2 IMPLANT
TUBE ANAEROBIC SPECIMEN COL (MISCELLANEOUS) IMPLANT
UNDERPAD 30X30 INCONTINENT (UNDERPADS AND DIAPERS) ×2 IMPLANT
WATER STERILE IRR 1000ML POUR (IV SOLUTION) ×2 IMPLANT

## 2012-08-25 NOTE — Transfer of Care (Signed)
Immediate Anesthesia Transfer of Care Note  Patient: Charles Vasquez  Procedure(s) Performed: Procedure(s) with comments: INSERTION OF ARTERIOVENOUS (AV) GORE-TEX GRAFT ARM (Right) LIGATION OF ARTERIOVENOUS  FISTULA (Right) - Ligation of right brachial vein transposition  Patient Location: PACU  Anesthesia Type:General  Level of Consciousness: awake, alert  and oriented  Airway & Oxygen Therapy: Patient Spontanous Breathing and Patient connected to nasal cannula oxygen  Post-op Assessment: Report given to PACU RN and Post -op Vital signs reviewed and stable  Post vital signs: Reviewed and stable  Complications: No apparent anesthesia complications

## 2012-08-25 NOTE — Interval H&P Note (Signed)
[facility] [place]  History and Physical Update  The patient was interviewed and re-examined.  The patient's previous History and Physical has been reviewed and is unchanged.  There is no change in the plan of care: RUA AVG, ligation of BRVT.  [name], MD [facility] [place] Office: [phone] Pager: [phone]  08/25/2012, 7:56 AM

## 2012-08-25 NOTE — Preoperative (Signed)
Beta Blockers   Reason not to administer Beta Blockers:Not Applicable 

## 2012-08-25 NOTE — Telephone Encounter (Signed)
notified patient of fu appt. with [name] on 09-25-12 at 2pm

## 2012-08-25 NOTE — H&P (View-Only) (Signed)
[facility] [place]  Postoperative Access Visit  History of Present Illness  Charles Vasquez is a 77 y.o. year old male who presents for postoperative follow-up for: R 1st BrVT (Date: 03/31/12).  The patient's wounds are healed.  The patient notes no steal symptoms.  The patient is able to complete their activities of daily living.  The patient's current symptoms are: none.  The pt's previous R arm swelling due to venous outflow resolved with moving the TDC to LIJV.  The pt continues to dialyze from LIJV TDC.  Past Medical History  Diagnosis Date  . Chronic kidney disease   . Stroke   . Myocardial infarction   . Hypertension     takes Amlodipine every other day  . Pneumonia     last yr  . Dizziness   . History of blood transfusion   . Diabetes mellitus without complication     lantus daily and humalog sliding scale    Past Surgical History  Procedure Laterality Date  . Arteriovenous graft placement    . Toe amputation    . Av fistula placement  03/31/2012    Procedure: ARTERIOVENOUS (AV) FISTULA CREATION;  Surgeon: Brian L Chen, MD;  Location: [facility] OR;  Service: Vascular;  Laterality: Right;  First stage Brachial vein transposition  . Insertion of dialysis catheter Left 05/05/2012    Procedure: INSERTION OF DIALYSIS CATHETER;  Surgeon: Christopher S Dickson, MD;  Location: [facility] OR;  Service: Vascular;  Laterality: Left;  . Removal of a dialysis catheter Right 05/05/2012    Procedure: REMOVAL OF A DIALYSIS CATHETER;  Surgeon: Christopher S Dickson, MD;  Location: [facility] OR;  Service: Vascular;  Laterality: Right;    History   Social History  . Marital Status: Widowed    Spouse Name: N/A    Number of Children: N/A  . Years of Education: N/A   Occupational History  . Not on file.   Social History Main Topics  . Smoking status: Never Smoker   . Smokeless tobacco: Never Used  . Alcohol Use: No  . Drug Use: No  . Sexually Active: Yes   Other Topics Concern  .  Not on file   Social History Narrative  . No narrative on file    Family History  Problem Relation Age of Onset  . Diabetes Mother   . Hypertension Mother   . Diabetes Father   . Hypertension Father     Current Outpatient Prescriptions on File Prior to Visit  Medication Sig Dispense Refill  . amLODipine (NORVASC) 5 MG tablet Take 5 mg by mouth daily.      . aspirin EC 81 MG tablet Take 81 mg by mouth daily.      . calcium carbonate (OS-CAL - DOSED IN MG OF ELEMENTAL CALCIUM) 1250 MG tablet Take 1 tablet by mouth 2 (two) times daily.      . cholecalciferol (VITAMIN D) 1000 UNITS tablet Take 1,000 Units by mouth daily.      . Dextrose, Diabetic Use, (GLUCOSE PO) Take 1 tablet by mouth once as needed. For diabetic emergencies      . furosemide (LASIX) 40 MG tablet Take 40 mg by mouth 2 (two) times daily.      . insulin glargine (LANTUS) 100 UNIT/ML injection Inject 10 Units into the skin at bedtime.      . lidocaine-prilocaine (EMLA) cream Apply 1 application topically every Monday, Wednesday, and Friday with hemodialysis.       .   multivitamin (RENA-VIT) TABS tablet Take 1 tablet by mouth daily.      . oxyCODONE-acetaminophen (ROXICET) 5-325 MG per tablet Take 1-2 tablets by mouth every 4 (four) hours as needed for pain.  20 tablet  0   No current facility-administered medications on file prior to visit.    Allergies  Allergen Reactions  . Lisinopril     REACTION: hyperkalemia/renal deterioration  . Other     Lettuce causes blisters    Review of Systems (Positive items checked otherwise negative)  General: [ ] Weight loss, [ ] Weight gain, [ ]  Loss of appetite, [ ] Fever  Neurologic: [ ] Dizziness, [ ] Blackouts, [ ] Headaches, [ ] Seizure  Ear/Nose/Throat: [ ] Change in eyesight, [ ] Change in hearing, [ ] Nose bleeds, [ ] Sore throat  Vascular: [ ] Pain in legs with walking, [ ] Pain in feet while lying flat, [ ] Non-healing ulcer, Stroke, [ ] "Mini stroke", [ ] Slurred  speech, [ ] Temporary blindness, [ ] Blood clot in vein, [ ] Phlebitis  Pulmonary: [ ] Home oxygen, [ ] Productive cough, [ ] Bronchitis, [ ] Coughing up blood,  [ ] Asthma, [ ] Wheezing  Musculoskeletal: [ ] Arthritis, [ ] Joint pain, [ ] Muscle pain  Cardiac: [ ] Chest pain, [ ] Chest tightness/pressure, [ ] Shortness of breath when lying flat, [ ] Shortness of breath with exertion, [ ] Palpitations, [ ] Heart murmur, [ ] Arrythmia,  [ ] Atrial fibrillation  Hematologic: [ ] Bleeding problems, [ ] Clotting disorder, [ ] Anemia  Psychiatric:  [ ] Depression, [ ] Anxiety, [ ] Attention deficit disorder  Gastrointestinal:  [ ] Black stool,[ ]  Blood in stool, [ ] Peptic ulcer disease, [ ] Reflux, [ ] Hiatal hernia, [ ] Trouble swallowing, [ ] Diarrhea, [ ] Constipation  Urinary:  [x] Kidney disease, [ ] Burning with urination, [ ] Frequent urination, [ ] Difficulty urinating  Skin: [ ] Ulcers, [ ] Rashes  Physical Examination  Filed Vitals:   08/07/12 1503  BP: 107/62  Pulse: 69   GEN: WDWN, A&O x 3  PULM: CTAB, sym exp, good air movement  CV:  RRR, Nl s1,s2, no M/G  ABD:  soft, NTND  RUE:  Incision is healed, skin feels warm, hand grip is 5/5, sensation in digits is intact, palpable thrill, bruit can be auscultated , on Sonosite: distal fistula only 4.5 mm maximum for ~5 cm, ~ mid-arm brachial vein dilates to 7-8 mm  Medical Decision Making  Charles Vasquez is a 77 y.o. year old male who presents s/p R 1st stage BrVT.  After ~4 months, the patient's distal brachial vein is not adequate, so I doubt it will mature with additional time.    I recommend: ligation of R brachial vein transposition and placement of RUA AVG.  Thank you for allowing us to participate in this patient's care.  Brian Chen, MD [facility] [place] Office: [phone] Pager: [phone]   

## 2012-08-25 NOTE — Anesthesia Procedure Notes (Signed)
Procedure Name: LMA Insertion Date/Time: 08/25/2012 12:05 PM Performed by: Marena Chancy Pre-anesthesia Checklist: Emergency Drugs available, Patient identified, Timeout performed, Suction available and Patient being monitored Patient Re-evaluated:Patient Re-evaluated prior to inductionOxygen Delivery Method: Circle system utilized Preoxygenation: Pre-oxygenation with 100% oxygen Intubation Type: IV induction LMA Size: 4.0 Number of attempts: 1 Placement Confirmation: breath sounds checked- equal and bilateral and positive ETCO2 Tube secured with: Tape Dental Injury: Teeth and Oropharynx as per pre-operative assessment

## 2012-08-25 NOTE — Anesthesia Postprocedure Evaluation (Signed)
  Anesthesia Post-op Note  Patient: Charles Vasquez  Procedure(s) Performed: Procedure(s) with comments: INSERTION OF ARTERIOVENOUS (AV) GORE-TEX GRAFT ARM (Right) LIGATION OF ARTERIOVENOUS  FISTULA (Right) - Ligation of right brachial vein transposition  Patient Location: PACU  Anesthesia Type:General  Level of Consciousness: awake  Airway and Oxygen Therapy: Patient Spontanous Breathing  Post-op Pain: mild  Post-op Assessment: Post-op Vital signs reviewed, Patient's Cardiovascular Status Stable, Respiratory Function Stable, Patent Airway, No signs of Nausea or vomiting and Pain level controlled  Post-op Vital Signs: stable  Complications: No apparent anesthesia complications

## 2012-08-25 NOTE — Op Note (Signed)
OPERATIVE NOTE   PROCEDURE:   1.  right upper arm arteriovenous graft placement 2.  Ligation of right first stage brachial vein transposition   PRE-OPERATIVE DIAGNOSIS: failure to mature of right brachial vein transposition   POST-OPERATIVE DIAGNOSIS: same as above   SURGEON: [name], MD  ASSISTANT(S): Lianne Cure, PAC   ANESTHESIA: general  ESTIMATED BLOOD LOSS: 50 cc  FINDING(S): Brachial vein transposition only 4 mm in diameter Palpable thrill and dopplerable radial signal at end of case  SPECIMEN(S):  none  INDICATIONS:   Charles Vasquez is a 77 y.o. male who presents with end stage renal disease and right first stage brachial vein transposition that has failed to mature over 3-4 months.  I discussed with the patient ligating the right brachial vein transposition and placing a right upper arm arteriovenous graft.  Risk, benefits, and alternatives to access surgery were discussed.  The patient is aware the risks include but are not limited to: bleeding, infection, steal syndrome, nerve damage, ischemic monomelic neuropathy, failure to mature, and need for additional procedures.  The patient is aware of the risks and elects to proceed forward.  DESCRIPTION: After full informed written consent was obtained from the patient, the patient was brought back to the operating room and placed supine upon the operating table.  The patient was given IV antibiotics prior to proceeding.  After obtaining adequate sedation, the patient was prepped and draped in standard fashion for a right arm access procedure.  I turned my attention first to the antecubitum.  Under ultrasound guidance, I identified the location of the brachial vein transposition and marked it on the skin.  I then examined the bicipital groove in the proximal 1/3 of the upper arm and identified the high brachial vein and marked it on the skin.  I injected a mixture of 1% lidocaine with epinephrine and 0.5% Marcaine without  epinephrine at this location and at the high bicipital groove to obtain some anesthesia.  In total, I used 20 mL of this mixture to obtain anesthesia at the antecubital incision, high bicipital incision and also for the tunnel route.  I made an incision over the brachial vein transposition and dissected down through the subcutaneous tissue to the fascia carefully and was able to dissect out the brachial artery and brachial vein transposition.  The artery was about 4 mm externally.  The fistula was only 4 mm despite 3-4 months of maturation. I ligated the brachial vein transposition with a 2-0 silk and transected the fistula.  I then dissected out the brachial artery proximal to the prior anastomosis.  It was controlled proximally and distally with vessel loops and then I turned my attention to the high bicipital groove.  I made an incision at the previously anesthetized site, dissected down through the subcutaneous tissue and fascia until I reached the high brachial vein.  Externally, it appeared to be 6-7 mm in diameter.  I then dissected this vein proximally and distal.  I then I injected the mixture of local along the marked route for a subcutaneous tunnel.  I took a Company secretary and dissected from the antecubital up to the high bicipital incision.  Then I delivered the 4 x 7-mm stretch Gore-Tex graft, through this metal tunneler and then pulled out the metal tunneler leaving the graft in place.  The 4-mm end was left on the antecubital side and the 7 mm toward the axillary.  I then gave the patient 5000 units of heparin to  gain some anticoagulation.  After waiting 3 minutes, I placed the brachial artery under tension proximally and distally with vessel loops, made an arteriotomy and extended it with a Potts scissor.  I sewed the 4-mm end of the graft to this arteriotomy with a running stitch of 6-0 Prolene.  At this point, then I completed the anastomosis in the usual fashion.  I released the vessel loops  on the inflow and allowed the artery to decompress through the graft. There was good pulsatile bleeding through this graft.  I clamped the graft near its arterial anastomosis and sucked out all the blood in the graft and loaded the graft with heparinized saline.  At this point, I pulled the graft to appropriate length and reset my exposure of the high brachial vein.  I tied off the high brachial vein distally with a 2-0 silk and then transected it.  There was good venous backbleeding from the vein.  Then, I injected some heparinized saline into this vein and then clamped it.  I cut the graft to appropriate length for this anastomosis.  This graft was sewn to the vein in an end-to-end configuration with a 6-0 Prolene.  Prior to completing this anastomosis, I allowed the vein to back bleed and then I also allowed the artery to bleed in an antegrade fashion.  I completed this anastomosis in the usual fashion, and then irrigated out the high bicipital exposure and then placed thrombin and Gelfoam.  I then turned my attention back to the antecubitum.  The distal radial pulse was dopplerable.  Using a Doppler and biphasic signal, the brachial artery proximally and distally had multiphasic waveforms.  The venous outflow had a flow signature consistent with widely patent arterial venous graft.  At this point, I washed out the antecubital incision.  There was no more active bleeding.  The subcutaneous tissue was reapproximated with a running stitch of 3-0 Vicryl.  The skin was then reapproximated with a running subcuticular 4-0 Monocryl.  The skin was then cleaned, dried, and Dermabond used to reinforce the skin closure.  We then turned our attention to the high bicipital exposure.  I removed all the thrombin and Gelfoam and washed out the wound.  There was no more active bleeding.  The subcutaneous tissue was repaired with running stitch of 3-0 Vicryl.  The skin was then reapproximated with running subcuticular 4-0  Monocryl.  The skin was then cleaned, dried, and then the skin closure was reinforced with Dermabond.    COMPLICATIONS: none  CONDITION: stable   [name], MD [facility] [place] Office: [phone] Pager: [phone]  08/25/2012, 1:44 PM

## 2012-08-25 NOTE — Anesthesia Preprocedure Evaluation (Signed)
Anesthesia Evaluation  Patient identified by MRN, date of birth, ID band Patient awake    Reviewed: Allergy & Precautions, H&P , NPO status , Patient's Chart, lab work & pertinent test results, reviewed documented beta blocker date and time   Airway Mallampati: I TM Distance: >3 FB Neck ROM: Full    Dental  (+) Edentulous Upper, Edentulous Lower and Dental Advisory Given   Pulmonary neg pulmonary ROS, pneumonia -,  + rhonchi   Pulmonary exam normal       Cardiovascular hypertension, Pt. on medications + Past MI Rhythm:Regular Rate:Normal     Neuro/Psych CVA    GI/Hepatic Neg liver ROS, GERD-  Medicated,  Endo/Other  diabetes, Well Controlled, Type 2, Insulin Dependent  Renal/GU CRF and DialysisRenal diseaseHD the past year     Musculoskeletal   Abdominal   Peds  Hematology   Anesthesia Other Findings   Reproductive/Obstetrics                           Anesthesia Physical Anesthesia Plan  ASA: IV  Anesthesia Plan: General   Post-op Pain Management:    Induction: Intravenous  Airway Management Planned: LMA  Additional Equipment:   Intra-op Plan:   Post-operative Plan: Extubation in OR  Informed Consent: I have reviewed the patients History and Physical, chart, labs and discussed the procedure including the risks, benefits and alternatives for the proposed anesthesia with the patient or authorized representative who has indicated his/her understanding and acceptance.     Plan Discussed with: CRNA and Surgeon  Anesthesia Plan Comments:         Anesthesia Quick Evaluation

## 2012-08-27 ENCOUNTER — Encounter (HOSPITAL_COMMUNITY): Payer: Self-pay | Admitting: Vascular Surgery

## 2012-09-08 ENCOUNTER — Other Ambulatory Visit: Payer: Self-pay | Admitting: *Deleted

## 2012-09-08 DIAGNOSIS — Z4931 Encounter for adequacy testing for hemodialysis: Secondary | ICD-10-CM

## 2012-09-08 DIAGNOSIS — N186 End stage renal disease: Secondary | ICD-10-CM

## 2012-09-24 ENCOUNTER — Encounter: Payer: Self-pay | Admitting: Vascular Surgery

## 2012-09-25 ENCOUNTER — Ambulatory Visit (INDEPENDENT_AMBULATORY_CARE_PROVIDER_SITE_OTHER): Payer: Medicare Other | Admitting: Vascular Surgery

## 2012-09-25 ENCOUNTER — Encounter: Payer: Self-pay | Admitting: Vascular Surgery

## 2012-09-25 ENCOUNTER — Encounter (INDEPENDENT_AMBULATORY_CARE_PROVIDER_SITE_OTHER): Payer: Medicare Other | Admitting: *Deleted

## 2012-09-25 VITALS — BP 138/65 | HR 54 | Resp 16 | Ht 68.0 in | Wt 137.0 lb

## 2012-09-25 DIAGNOSIS — Z48812 Encounter for surgical aftercare following surgery on the circulatory system: Secondary | ICD-10-CM

## 2012-09-25 DIAGNOSIS — N186 End stage renal disease: Secondary | ICD-10-CM

## 2012-09-25 DIAGNOSIS — Z4931 Encounter for adequacy testing for hemodialysis: Secondary | ICD-10-CM

## 2012-09-25 NOTE — Progress Notes (Signed)
  VASCULAR AND VEIN SURGERY PROGRESS NOTE  POST-OP HEMODIALYSIS ACCESS  Date of Surgery: *08/25/12 right UA AVGG Surgeon: BLC    HPI: Charles Vasquez is a 77 y.o. male who is 1 month s/p creation/revision of right upper extremity Hemodialysis access. The patient denies symptoms of numbness, tingling, weakness; denies pain in the operative limb.   Significant Diagnostic Studies: CBC Lab Results  Component Value Date   WBC 4.5 08/22/2008   HGB 13.9 08/25/2012   HCT 41.0 08/25/2012   MCV 83.0 08/22/2008   PLT 134* 08/22/2008    BMET    Component Value Date/Time   NA 139 08/25/2012 0750   K 4.0 08/25/2012 0750   CL 100 03/31/2012 0614   CO2 17* 09/19/2008 2341   GLUCOSE 178* 08/25/2012 0750   BUN 35* 03/31/2012 0614   CREATININE 3.60* 03/31/2012 0614   CALCIUM 8.5 09/19/2008 2341    COAG No results found for this basename: INR, PROTIME   No results found for this basename: PTT    Vital Signs  BP Readings from Last 3 Encounters:  09/25/12 138/65  08/25/12 121/62  08/25/12 121/62   Temp Readings from Last 3 Encounters:  08/25/12 97 F (36.1 C) Oral  08/25/12 97 F (36.1 C) Oral  05/05/12 97 F (36.1 C)    SpO2 Readings from Last 3 Encounters:  09/25/12 100%  08/25/12 100%  08/25/12 100%   Pulse Readings from Last 3 Encounters:  09/25/12 54  08/25/12 74  08/25/12 74     Physical Examination  right upper Incision is healing well, skin color is normal , hand grip is 5/5, sensation in digits is intact;  There is a good thrill and good bruit in the RUA AVGG.  Assessment/Plan Charles Vasquez is a 77 y.o. year old male who presents s/p creation/revision of right upper extremity Hemodialysis access. Follow-up as needed  The patient's access is ready for use  Will remove L IJ catheter when right arm AVGG has been used successfully  Charles Vasquez 09/25/2012 3:34 PM   Addendum  I have independently interviewed and examined the patient, and I agree with the  physician assistant's findings.  Good thrill and bruit in RUA AVG.  D/C TDC after 2 successful sticks.  [name], MD [facility] Office: [phone] Pager: [phone]  09/25/2012, 5:38 PM

## 2012-10-05 ENCOUNTER — Other Ambulatory Visit (HOSPITAL_COMMUNITY): Payer: Self-pay | Admitting: Nephrology

## 2012-10-05 DIAGNOSIS — N186 End stage renal disease: Secondary | ICD-10-CM

## 2012-10-08 ENCOUNTER — Ambulatory Visit (HOSPITAL_COMMUNITY)
Admission: RE | Admit: 2012-10-08 | Discharge: 2012-10-08 | Disposition: A | Discharge status: Home / Self Care | Payer: Medicare Other | Source: Ambulatory Visit | Attending: Nephrology | Admitting: Nephrology

## 2012-10-08 DIAGNOSIS — Z452 Encounter for adjustment and management of vascular access device: Secondary | ICD-10-CM | POA: Insufficient documentation

## 2012-10-08 DIAGNOSIS — Z992 Dependence on renal dialysis: Secondary | ICD-10-CM | POA: Insufficient documentation

## 2012-10-08 DIAGNOSIS — N186 End stage renal disease: Secondary | ICD-10-CM

## 2012-10-08 MED ORDER — CHLORHEXIDINE GLUCONATE 4 % EX LIQD
CUTANEOUS | Status: AC
  Filled 2012-10-08: qty 45

## 2014-06-28 NOTE — Op Note (Signed)
PATIENT NAME:  Charles Vasquez, Charles Vasquez MR#:  [id number] DATE OF BIRTH:  05/28/1935  DATE OF PROCEDURE:  09/26/2011  PREOPERATIVE DIAGNOSES:  1. End-stage renal disease.  2. Poorly maturing left arm AV fistula.   POSTOPERATIVE DIAGNOSES:  1. End-stage renal disease.  2. Poorly maturing left arm AV fistula.   PROCEDURES:  1. Ultrasound guidance for vascular access to left brachiobasilic AV fistula.  2. Left upper extremity fistulogram and central venogram.  3. Percutaneous transluminal angioplasty with 6 mm conventional and high-pressure angioplasty balloon to paraanastomotic stenosis.   SURGEON: Annice Needy, MD  ANESTHESIA: Local with moderate conscious sedation.   ESTIMATED BLOOD LOSS: Minimal.   INDICATION FOR PROCEDURE: This is a gentleman with endstage renal disease. His brachiobasilic AV fistula has not yet matured to the point that transposition has been performed. It is patent but there is a stenosis near the anastomosis and intervention is recommended for salvage of the fistula to be able to get this usable for dialysis. Risks and benefits are discussed, informed consent is obtained.   DESCRIPTION OF PROCEDURE: Patient was brought to the vascular interventional radiology suite. Left upper extremity was sterilely prepped and draped, a sterile surgical field was created. The fistula was accessed in the mid upper arm in a retrograde fashion with a micropuncture needle, micropuncture wire and sheath were placed. 6 French sheath was then placed. The patient was given 2500 units of intravenous heparin for systemic anticoagulation. A Kumpe catheter was placed at the anastomosis. Imaging showed an approximately 70% stenosis just beyond the anastomosis within the basilic vein. I crossed the lesion without difficulty, placed a Magic torque wire and used a 6 mm diameter conventional and high-pressure angioplasty balloon. A tight waste was taken which resolved at 22 atmospheres. Following angioplasty, the  fistula was now patent with significantly improved flow and mild residual stenosis. The remainder of the fistula appeared patent although did appear to be some narrowing in the left innominate vein. This did not appear to be high-grade. At this point I elected to terminate the procedure. The sheath was removed around a 4-0 Monocryl pursestring suture. Pressure was held. Sterile dressing was placed. The patient tolerated procedure well.   ____________________________ Annice Needy, MD jsd:cms D: 09/26/2011 11:12:49 ET T: 09/26/2011 11:39:42 ET JOB#: [id number]  cc: Annice Needy, MD, <Dictator>  Annice Needy MD ELECTRONICALLY SIGNED 09/30/2011 16:19

## 2014-06-28 NOTE — Op Note (Signed)
PATIENT NAME:  Charles Vasquez, WEYMAN MR#:  [id number] DATE OF BIRTH:  January 16, 1936  DATE OF PROCEDURE:  01/21/2012  PREOPERATIVE DIAGNOSES:  1. End-stage renal disease requiring hemodialysis.  2. Superior vena cava syndrome.  3. Complication AV dialysis graft with thrombosis.   POSTOPERATIVE DIAGNOSES: 1. End-stage renal disease requiring hemodialysis.  2. Superior vena cava syndrome.  3. Complication AV dialysis graft with thrombosis.   PROCEDURE PERFORMED: Insertion of right IJ cuffed tunneled dialysis catheter with ultrasound guidance and fluoroscopic guidance.   PROCEDURE PERFORMED BY: Renford Dills, MD   SEDATION: Versed 3 mg plus fentanyl 100 mcg administered IV. Continuous ECG, pulse oximetry, and cardiopulmonary monitoring was performed throughout the entire procedure by the interventional radiology nurse. Total sedation time was 40 minutes.   ACCESS: Right IJ.   CONTRAST: None.   FLUORO TIME: Less than 0.5 minutes.   INDICATIONS: Mr. Fracasso is a 79 year old gentleman who recently underwent attempted thrombectomy of his graft. This has not been durable and he is, therefore, undergoing placement of a tunneled catheter and will proceed with creation of a new access. Risks and benefits were reviewed. The patient agrees to proceed.   PROCEDURE: The patient is taken to Special Procedures and placed in the supine position. After adequate sedation is achieved, right neck and chest wall are prepped and draped in a sterile fashion. 1% lidocaine is then infiltrated in the soft tissues at the base of the neck. Ultrasound is placed in a sterile sleeve. Jugular vein is identified. It is echolucent and compressible indicating patency. Image is recorded. Under direct ultrasound visualization, micropuncture needle is inserted into the jugular vein. Microwire is advanced but it will not track below the level of the clavicle. Micro sheath is then inserted over the wire and using a combination of the micro  sheath and the wire, the venous stenosis is doddered. The micro sheath is then advanced to its hub. Dilator and wire are removed. Aspiration of blood is performed. J-wire is then advanced under fluoroscopic guidance down into the inferior vena cava. Small counterincision is created and a pocket bluntly dissected with a hemostat. Exit site is selected on the chest and anesthetized with 1% lidocaine. Small transverse incision is made and the tunneling device is passed from the exit site to the neck counterincision. Dilator is passed over the wire and the peel-away sheath is inserted. Dilator and wire are removed and a 19 cm tip-to-cuff Cannon catheter is advanced through the peel-away sheath. Peel-away sheath is removed. Lady Gary is then pulled subcutaneously and the hub is connected.   Both lumens aspirate well and flush easily. They are packed with 5,000 units of heparin per lumen. Catheter is secured to the chest wall with 0 silk and the counterincision at the neck is closed with 4-0 Monocryl and then Dermabond. The patient tolerated the procedure well and there were no immediate complications. Sponge and needle counts are correct.   ____________________________ Renford Dills, MD ggs:drc D: 01/21/2012 17:44:44 ET T: 01/22/2012 08:11:41 ET JOB#: [id number]  cc: Renford Dills, MD, <Dictator> Renford Dills MD ELECTRONICALLY SIGNED 01/25/2012 16:26

## 2014-06-28 NOTE — Op Note (Signed)
PATIENT NAME:  Charles Vasquez, Charles Vasquez MR#:  [id number] DATE OF BIRTH:  1935-11-21  DATE OF PROCEDURE:  11/28/2011  PREOPERATIVE DIAGNOSIS: End-stage renal disease requiring permanent dialysis access.   POSTOPERATIVE DIAGNOSIS: End-stage renal disease requiring permanent dialysis access.   PROCEDURE: Left brachial basilic AV fistula transposition.   SURGEON: Annice Needy, MD  ANESTHESIA: General.   ESTIMATED BLOOD LOSS: Approximately 75 mL.   INDICATION FOR PROCEDURE: 79 year old African American male with end-stage renal disease. He is brought for transposition of his left brachial basilic AV fistula. Risks and benefits are discussed. Informed consent was obtained.   DESCRIPTION OF PROCEDURE: The patient was brought to the vascular interventional radiology suite. Left upper extremity is sterilely prepped and draped, a sterile surgical field was created. An incision was created overlying the palpable thrill within the AV fistula. This is a long incision throughout the course of the upper arm. It was dissected out over a long length until it joined the deep vein and multiple side branches were ligated and divided between silk ties. The vein was marked for orientation. Care was taken not to injure the nerve. A tunnel was then created with a long curved tonsil. The fistula was clamped at the arterial anastomosis. I then transected the vein and tunneled it from the upper arm back to the antecubital area several centimeters from the actual incision to facilitate its use for access. The vein was then reanastomosed with parachuted two 6-0 Prolene suture anastomoses in an end-to-end fashion and a nice thrill was palpable within the AV fistula at the conclusion of the procedure. The wound was irrigated. Surgicel and Evicyl topical hemostatic agents were placed and hemostasis was complete. The wound was then closed with a running 3-0 Vicryl and a running 4-0 Monocryl. Dermabond was placed as a dressing.      The  patient tolerated procedure well and was taken to the recovery room in stable condition.    ____________________________ Annice Needy, MD jsd:cms D: 11/29/2011 10:36:00 ET T: 11/29/2011 12:20:42 ET JOB#: [id number]  cc: Annice Needy, MD, <Dictator> Annice Needy MD ELECTRONICALLY SIGNED 12/05/2011 13:42

## 2014-06-28 NOTE — Op Note (Signed)
PATIENT NAME:  Charles Vasquez, Charles Vasquez MR#:  [id number] DATE OF BIRTH:  06-Feb-1936  DATE OF PROCEDURE:  01/20/2012  PREOPERATIVE DIAGNOSES:  1. End-stage renal disease.  2. Clotted left arm AV fistula.  3. Hypertension.   POSTOPERATIVE DIAGNOSES:  1. End-stage renal disease.  2. Clotted left arm AV fistula.  3. Hypertension.   PROCEDURES PERFORMED:  1. Ultrasound guidance for vascular access to left arm AV fistula.  2. Catheter-directed thrombolysis with 4 mg of TPA delivered with the AngioJet AVX catheter.  3. Percutaneous transluminal angioplasty of left basilic vein with 7 and 8 mm diameter angioplasty balloons.  4. Covered stent placement with an 8 mm diameter x 10 centimeter in length Viabahn stent due to greater than 50% residual stenosis and extravasation after angioplasty.   SURGEON: Annice Needy, MD  ANESTHESIA: Local with moderate conscious sedation.   ESTIMATED BLOOD LOSS: Approximately 50 mL.   INDICATION FOR PROCEDURE: The patient is a 79 year old African American male with end-stage renal disease. His fistula was functional Friday, however, today he went to dialysis and it was occluded.   DESCRIPTION OF PROCEDURE: The patient was brought to the vascular interventional radiology suite for an attempt at salvage of his fistula. It was accessed a few centimeters beyond the anastomosis with a micropuncture needle and micropuncture wire and sheath were placed. This was done under direct ultrasound guidance. A permanent image was recorded. Imaging showed an occlusion of the fistula with thrombosis. I was able to cross this without difficulty with a Glidewire and place a 6 [place] sheath. With the glide wire across it, there was a very high-grade stenosis of the transition of the basilic vein into the deep venous system, the likely cause of the thrombosis. 4 mg of TPA were instilled to help with the thrombosis. This was allowed to dwell for several minutes. I then performed percutaneous  transluminal angioplasty with a 7 and then an 8 mm diameter angioplasty balloon to this area. There was also stenosis in the midportion of the AV fistula. Wastes were taken and it required over 20 atmospheres to resolve the waste and following angioplasty there was still greater than 50% residual stenosis as well as significant extravasation. I then up-sized to an 8 [place] sheath and placed an 8 mm diameter x 10 centimeter in length Viabahn stent. This was postdilated with an 8 mm balloon with excellent angiographic completion result and a palpable thrill in the AV fistula. At this point, I elected to terminate the procedure. The sheath was removed around a 4-0 Monocryl pursestring suture, pressure was held, and a sterile dressing was placed. The patient tolerated the procedure well and was taken to the recovery room in stable condition. ____________________________ Annice Needy, MD jsd:slb D: 01/20/2012 16:21:31 ET T: 01/20/2012 17:52:02 ET JOB#: [id number]  cc: Annice Needy, MD, <Dictator> Salomon Mast, MD Annice Needy MD ELECTRONICALLY SIGNED 01/27/2012 9:04

## 2014-07-03 NOTE — Op Note (Signed)
PATIENT NAME:  Charles Vasquez, Charles Vasquez MR#:  [id number] DATE OF BIRTH:  1936-01-23  DATE OF PROCEDURE:  06/06/2011  PREOPERATIVE DIAGNOSES:  1. End-stage renal disease.  2. Poorly maturing left brachiobasilic AV fistula.  3. Diabetes.   POSTOPERATIVE DIAGNOSES:  1. End-stage renal disease.  2. Poorly maturing left brachiobasilic AV fistula.  3. Diabetes.   PROCEDURES:  1. Ultrasound guidance for vascular access to left brachiobasilic AV fistula in a retrograde fashion.  2. Left upper extremity fistulogram.  3. Percutaneous transluminal angioplasty of perianastomotic stenosis with 5 and 5 mm diameter angioplasty balloon.   SURGEON: Annice Needy, MD  ANESTHESIA: Local with moderate conscious sedation.   ESTIMATED BLOOD LOSS: Approximately 25 mL.   INDICATION FOR PROCEDURE: 79 year old African American male who had a brachiobasilic AV fistula created several weeks ago. This is maturing poorly and noninvasive studies showed a significant stenosis not far beyond the fistula anastomosis. I recommended intervention for salvage of the fistula. Risks and benefits were discussed. Informed consent was obtained.   DESCRIPTION OF PROCEDURE: Patient was brought to the vascular interventional radiology suite. Left upper extremity was sterilely prepped and draped, sterile surgical field was created. The fistula was accessed in a retrograde direction under direct ultrasound guidance without difficulty with a micropuncture needle and permanent image was recorded. Micropuncture wire and sheath were then placed. Imaging through the micropuncture sheath was difficult to opacify. We upsized to a 6 [place] sheath and imaging now showed a very tight stenosis just beyond the anastomosis that was essentially a string sign. This lesion was actually quite difficult to cross and a stiff angled Glidewire and Kumpe catheter were used to cross this lesion. The patient was systemically heparinized. I exchanged for an 0.018 wire and a  4 mm diameter angioplasty balloon was inflated. A waste was taken which resolved and flow was improved but there was significant residual stenosis. I then upsized to a 5 mm diameter angioplasty balloon with again somewhat improved flow. There was residual narrowing but I did not feel comfortable stretching a reasonably fresh fistula beyond 5 mm at the perianastomotic area and so at this point I elected to terminate the procedure. The sheaths were removed around a 4-0 Monocryl pursestring suture. Pressure was held. Sterile dressing was placed. The patient tolerated procedure well and was taken to recovery room in stable condition.   ____________________________ Annice Needy, MD jsd:cms D: 06/06/2011 16:24:12 ET T: 06/06/2011 16:37:38 ET JOB#: [id number]  cc: Annice Needy, MD, <Dictator> Annice Needy MD ELECTRONICALLY SIGNED 06/12/2011 15:04

## 2014-07-03 NOTE — Op Note (Signed)
PATIENT NAME:  Charles, Vasquez MR#:  [id number] DATE OF BIRTH:  12-Apr-1935  DATE OF PROCEDURE:  07/18/2011  PREOPERATIVE DIAGNOSES:  1. End-stage renal disease.  2. Nonfunctional left arm AV fistula.  3. Diabetes.  4. Hypertension.   POSTOPERATIVE DIAGNOSES:  1. End-stage renal disease.  2. Nonfunctional left arm AV fistula.  3. Diabetes.  4. Hypertension.   PROCEDURES:  1. Ultrasound guidance for vascular access to left brachiobasilic AV fistula.  2. Left upper extremity fistulogram and central venogram.  3. Percutaneous transluminal angioplasty of stenosis just beyond the anastomosis with 5 and 6 mm diameter angioplasty balloon.   SURGEON: Annice Needy, M.D.   ANESTHESIA: Local with moderate conscious sedation.   ESTIMATED BLOOD LOSS: 25 mL.   INDICATION FOR PROCEDURE: The patient is a 79 year old African American male with end-stage renal disease. His left brachiobasilic AV fistula has not yet been transposed. A post procedural duplex showed a significant stenosis not far beyond the anastomosis. He is brought in for intervention to this before his transposition. Risks and benefits were discussed. Informed consent was obtained.   DESCRIPTION OF PROCEDURE: The patient was brought to the vascular interventional radiology suite. Left upper extremity was sterilely prepped and draped and a sterile surgical field was created. The fistula was accessed in a retrograde fashion in the upper arm. Under direct ultrasound guidance, a permanent image was recorded. A micropuncture wire and sheath were placed and we upsized to a 6 French sheath. 2,500 units of intravenous heparin was given for systemic anticoagulation. A Kumpe catheter was placed in the arterial anastomosis and this demonstrated a 70% to 80% stenosis at the swing point just beyond the anastomosis. The remainder of the fistula was patent with good central venous flow. At this point, a Magic torque wire was placed. A 5 mm diameter  angioplasty balloon and then a 6 mm diameter high-pressure angioplasty balloon were used to treat the lesion. A tight waist was broken at approximately 22 atmospheres with a 6 mm balloon and completion fistulogram following angioplasty showed this area now to be widely patent without significant residual stenosis. At this point, I elected to terminate the procedure. The sheath was removed around a 4-0 Monocryl pursestring suture. Pressure was held. Sterile dressing was placed. The patient tolerated the procedure well.  ___________________________ Annice Needy, MD jsd:ap D: 07/18/2011 09:26:25 ET T: 07/18/2011 12:00:50 ET JOB#: [id number]  cc: Annice Needy, MD, <Dictator> Annice Needy MD ELECTRONICALLY SIGNED 07/20/2011 7:40

## 2014-07-03 NOTE — Op Note (Signed)
PATIENT NAME:  Charles Vasquez, SCHWARK MR#:  [id number] DATE OF BIRTH:  Jan 10, 1936  DATE OF PROCEDURE:  05/02/2011  PREOPERATIVE DIAGNOSES: End-stage renal disease.   POSTOPERATIVE DIAGNOSIS: End-stage renal disease.   PROCEDURE: Left brachiobasilic AV fistula.   SURGEON: Annice Needy, M.D.   ANESTHESIA: General.   ESTIMATED BLOOD LOSS: Minimal.   INDICATION FOR PROCEDURE: The patient is a 79 year old African American male with multiple ongoing issues including end-stage renal disease. He  has had failed access previously. His only potentially usable vein for fistula creation is the basilic vein on the left. We are going to attempt an autogenous fistula creation for this patient.   DESCRIPTION OF PROCEDURE: The patient was brought to the operative suite. After an adequate level of general endotracheal anesthesia was obtained, his left upper extremity was sterilely prepped and draped and a sterile surgical field was created. A small oblique incision was created just above the antecubital fossa. The basilic vein was dissected out, it was small but patent, and I elected to use this in hopes that it would mature for fistula creation. The brachial artery was dissected out and encircled with vessel loops. The patient was given heparin. An anterior wall arteriotomy was created with an 11 blade and extended with Potts scissors and then the vein was ligated distally, cut, and beveled to an appropriate length to match our anterior wall arteriotomy. Anastomosis was created with a running 6-0 Prolene suture in the usual fashion. A single 6-0 Prolene patch suture was used for hemostasis. The vessel was flushed and deaired prior to release of control and on release there was audible flow within the fistula. The vein was      small and there was not a palpable thrill. The wound was then irrigated, Surgicel was placed and then closed with a running 3-0 Vicryl and a 4-0 Monocryl. Dermabond was placed as a dressing. The  patient tolerated the procedure well and was taken to the recovery room in stable condition.  ____________________________ Annice Needy, MD jsd:slb D: 05/02/2011 15:52:40 ET T: 05/02/2011 16:43:39 ET JOB#: [id number]  cc: Annice Needy, MD, <Dictator> Annice Needy MD ELECTRONICALLY SIGNED 05/03/2011 13:25

## 2014-12-02 IMAGING — CR DG CHEST 2V
2 series · 2 of 2 positions shown · non-contrast
Comparison: Chest radiograph performed 01/21/2012

CLINICAL DATA: Preoperative chest radiograph for end-stage renal
disease.

CHEST - 2 VIEW

[w chest pa]
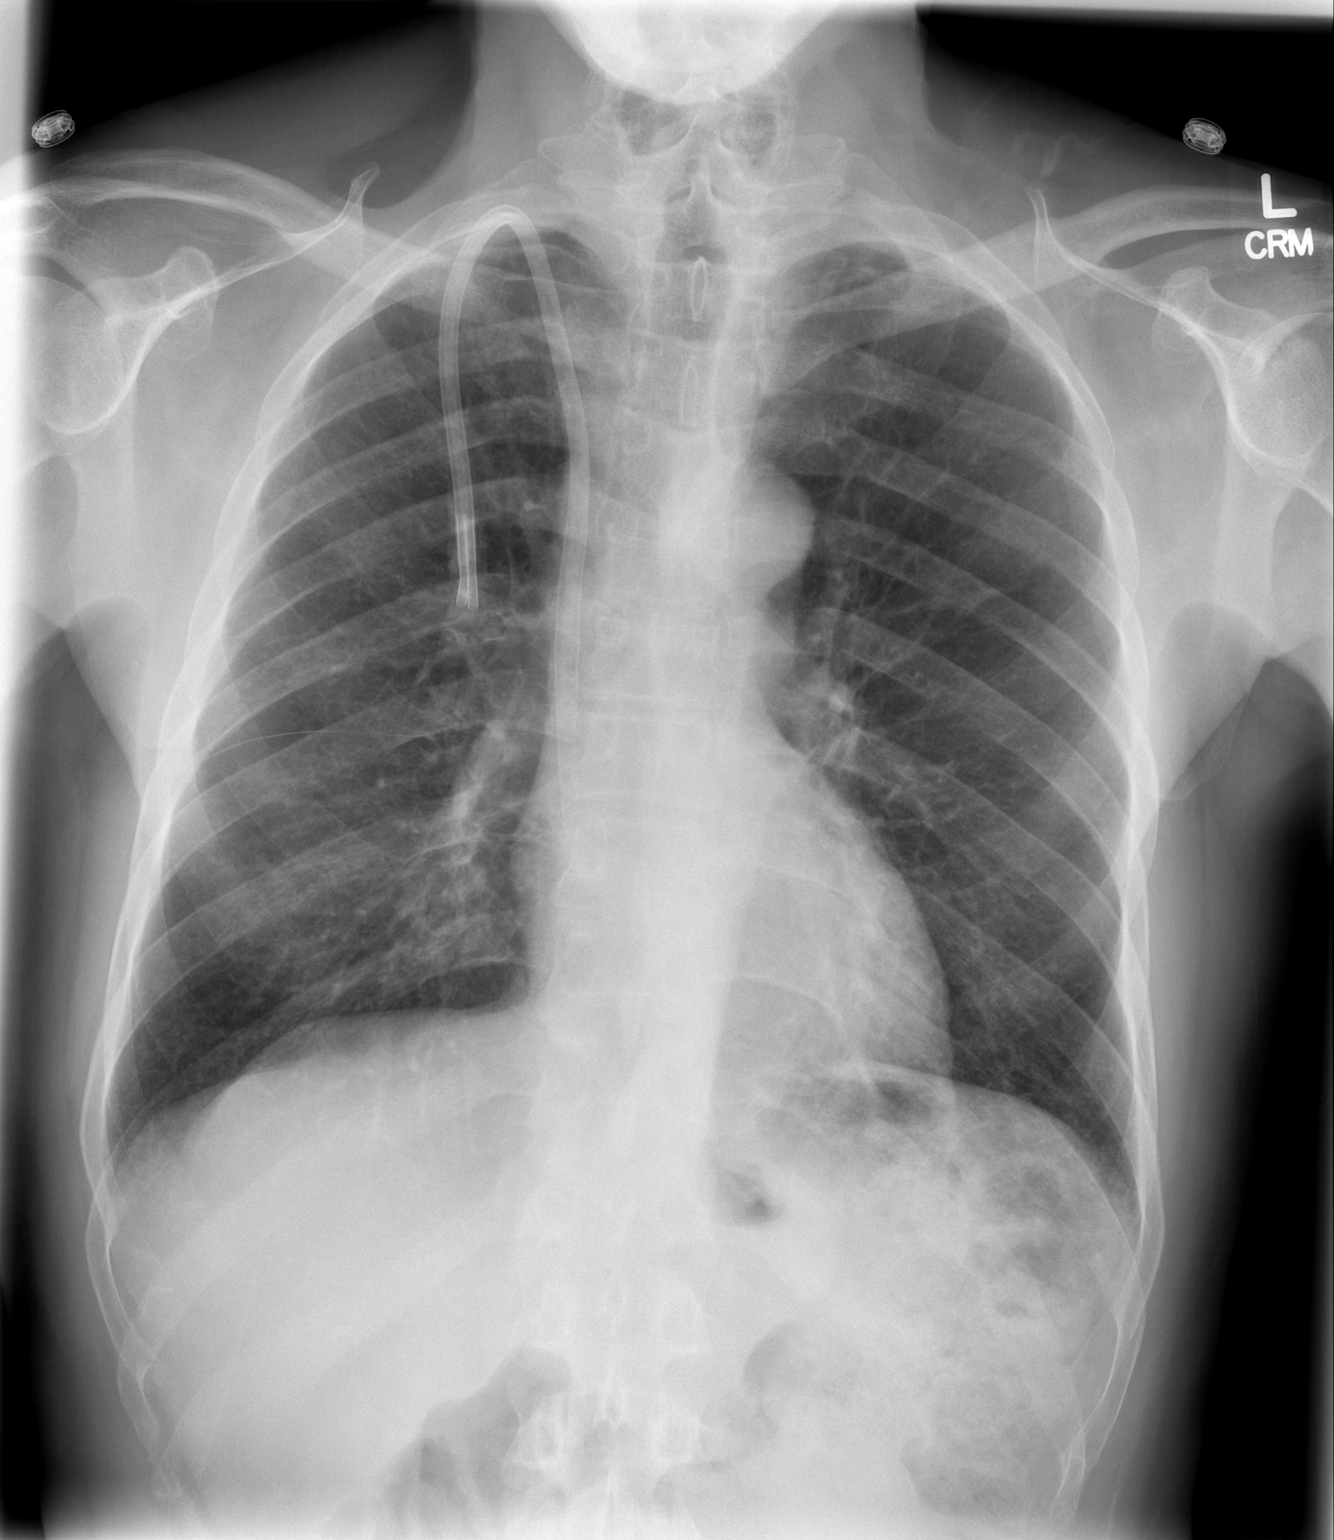

[w chest lat]
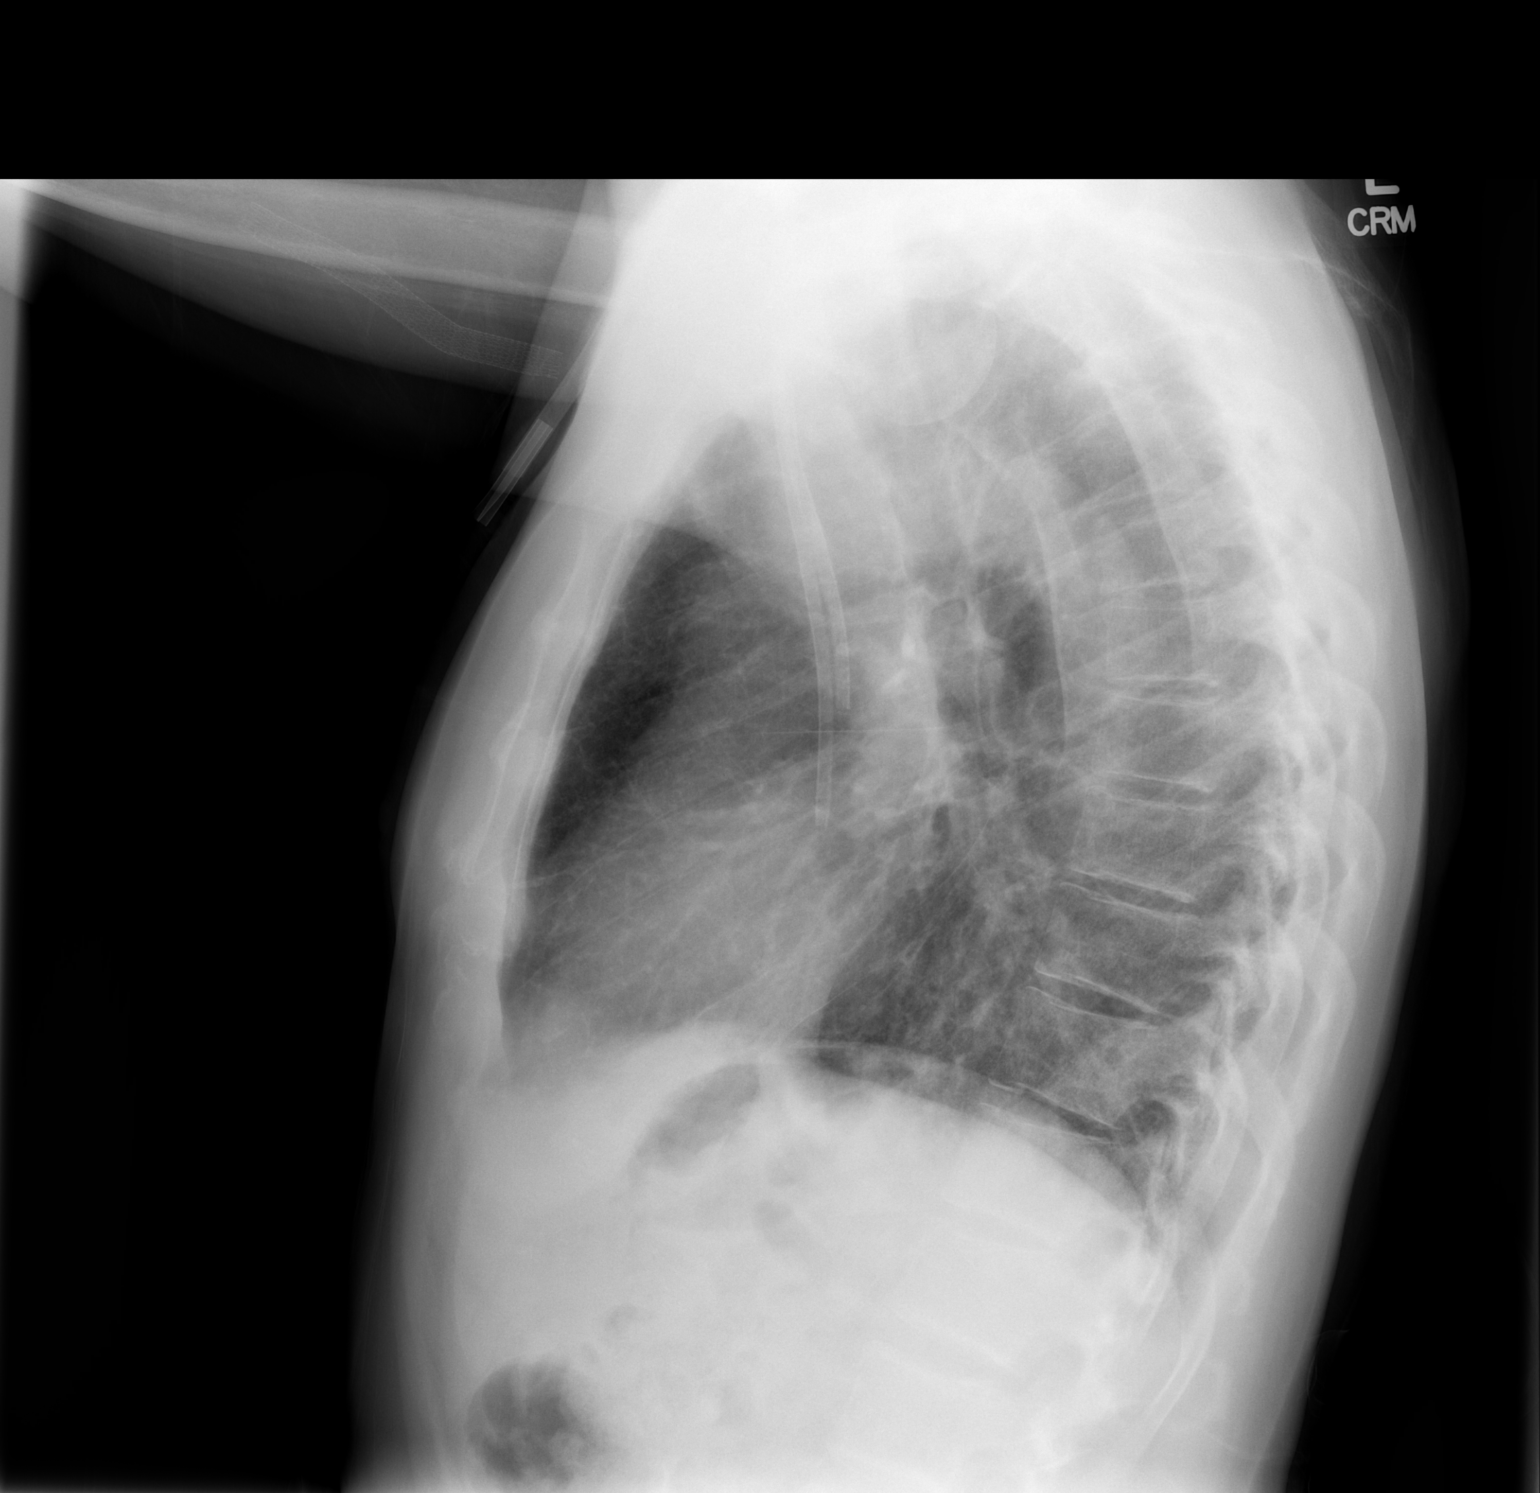

[2 of 2 positions shown; findings below may reference images not displayed]

FINDINGS: The lungs are well-aerated and clear.  There is no
evidence of focal opacification, pleural effusion or pneumothorax.

The heart is normal in size; the mediastinal contour is within
normal limits.  No acute osseous abnormalities are seen.  A right-
sided dual lumen catheter is noted ending about the cavoatrial
junction.
IMPRESSION: No acute cardiopulmonary process seen.

## 2015-06-23 ENCOUNTER — Other Ambulatory Visit (HOSPITAL_COMMUNITY): Payer: Self-pay | Admitting: Nephrology

## 2015-06-23 ENCOUNTER — Other Ambulatory Visit: Payer: Self-pay | Admitting: Radiology

## 2015-06-23 ENCOUNTER — Ambulatory Visit (HOSPITAL_COMMUNITY)
Admission: RE | Admit: 2015-06-23 | Discharge: 2015-06-23 | Disposition: A | Discharge status: Home / Self Care | Payer: Medicare Other | Source: Ambulatory Visit | Attending: Nephrology | Admitting: Nephrology

## 2015-06-23 ENCOUNTER — Encounter (HOSPITAL_COMMUNITY): Payer: Self-pay

## 2015-06-23 DIAGNOSIS — Z794 Long term (current) use of insulin: Secondary | ICD-10-CM | POA: Diagnosis not present

## 2015-06-23 DIAGNOSIS — Z992 Dependence on renal dialysis: Secondary | ICD-10-CM | POA: Diagnosis not present

## 2015-06-23 DIAGNOSIS — Y832 Surgical operation with anastomosis, bypass or graft as the cause of abnormal reaction of the patient, or of later complication, without mention of misadventure at the time of the procedure: Secondary | ICD-10-CM | POA: Diagnosis not present

## 2015-06-23 DIAGNOSIS — T82868A Thrombosis of vascular prosthetic devices, implants and grafts, initial encounter: Secondary | ICD-10-CM | POA: Diagnosis not present

## 2015-06-23 DIAGNOSIS — N186 End stage renal disease: Secondary | ICD-10-CM | POA: Insufficient documentation

## 2015-06-23 DIAGNOSIS — Z9889 Other specified postprocedural states: Secondary | ICD-10-CM | POA: Diagnosis not present

## 2015-06-23 DIAGNOSIS — Z79899 Other long term (current) drug therapy: Secondary | ICD-10-CM | POA: Insufficient documentation

## 2015-06-23 DIAGNOSIS — I120 Hypertensive chronic kidney disease with stage 5 chronic kidney disease or end stage renal disease: Secondary | ICD-10-CM | POA: Insufficient documentation

## 2015-06-23 DIAGNOSIS — Z8673 Personal history of transient ischemic attack (TIA), and cerebral infarction without residual deficits: Secondary | ICD-10-CM | POA: Diagnosis not present

## 2015-06-23 DIAGNOSIS — I252 Old myocardial infarction: Secondary | ICD-10-CM | POA: Diagnosis not present

## 2015-06-23 DIAGNOSIS — Z7982 Long term (current) use of aspirin: Secondary | ICD-10-CM | POA: Diagnosis not present

## 2015-06-23 DIAGNOSIS — E1122 Type 2 diabetes mellitus with diabetic chronic kidney disease: Secondary | ICD-10-CM | POA: Insufficient documentation

## 2015-06-23 DIAGNOSIS — Z89429 Acquired absence of other toe(s), unspecified side: Secondary | ICD-10-CM | POA: Diagnosis not present

## 2015-06-23 LAB — POCT I-STAT, CHEM 8
BUN: 51 mg/dL — AB (ref 6–20)
CREATININE: 5.9 mg/dL — AB (ref 0.61–1.24)
Calcium, Ion: 1.01 mmol/L — ABNORMAL LOW (ref 1.13–1.30)
Chloride: 103 mmol/L (ref 101–111)
GLUCOSE: 75 mg/dL (ref 65–99)
HEMATOCRIT: 36 % — AB (ref 39.0–52.0)
Hemoglobin: 12.2 g/dL — ABNORMAL LOW (ref 13.0–17.0)
POTASSIUM: 4.9 mmol/L (ref 3.5–5.1)
Sodium: 136 mmol/L (ref 135–145)
TCO2: 26 mmol/L (ref 0–100)

## 2015-06-23 LAB — GLUCOSE, CAPILLARY
GLUCOSE-CAPILLARY: 72 mg/dL (ref 65–99)
GLUCOSE-CAPILLARY: 124 mg/dL — AB (ref 65–99)
Glucose-Capillary: 44 mg/dL — CL (ref 65–99)
Glucose-Capillary: 63 mg/dL — ABNORMAL LOW (ref 65–99)

## 2015-06-23 MED ORDER — MIDAZOLAM HCL 2 MG/2ML IJ SOLN
INTRAMUSCULAR | Status: AC
  Filled 2015-06-23: qty 4

## 2015-06-23 MED ORDER — HYDRALAZINE HCL 20 MG/ML IJ SOLN
10.0000 mg | Freq: Once | INTRAMUSCULAR | Status: AC
  Administered 2015-06-23: 10 mg via INTRAVENOUS

## 2015-06-23 MED ORDER — IOPAMIDOL (ISOVUE-300) INJECTION 61%
INTRAVENOUS | Status: AC
  Administered 2015-06-23: 60 mL
  Filled 2015-06-23: qty 100

## 2015-06-23 MED ORDER — HYDRALAZINE HCL 20 MG/ML IJ SOLN
INTRAMUSCULAR | Status: AC
  Filled 2015-06-23: qty 1

## 2015-06-23 MED ORDER — NALOXONE HCL 0.4 MG/ML IJ SOLN
INTRAMUSCULAR | Status: DC
Start: 2015-06-23 — End: 2015-06-23
  Filled 2015-06-23: qty 1

## 2015-06-23 MED ORDER — ALTEPLASE 2 MG IJ SOLR
2.0000 mg | Freq: Once | INTRAMUSCULAR | Status: AC
  Administered 2015-06-23: 4 mg
  Filled 2015-06-23: qty 2

## 2015-06-23 MED ORDER — FENTANYL CITRATE (PF) 100 MCG/2ML IJ SOLN
INTRAMUSCULAR | Status: AC
  Filled 2015-06-23: qty 4

## 2015-06-23 MED ORDER — SODIUM CHLORIDE 0.9 % IV SOLN: Freq: Once | INTRAVENOUS | Status: DC

## 2015-06-23 MED ORDER — ALTEPLASE 100 MG IV SOLR: 4.0000 mg | Freq: Once | INTRAVENOUS | Status: DC

## 2015-06-23 MED ORDER — ALTEPLASE 2 MG IJ SOLR: 2.0000 mg | Freq: Once | INTRAMUSCULAR | Status: DC

## 2015-06-23 MED ORDER — LIDOCAINE HCL 1 % IJ SOLN
INTRAMUSCULAR | Status: AC
  Filled 2015-06-23: qty 20

## 2015-06-23 MED ORDER — FENTANYL CITRATE (PF) 100 MCG/2ML IJ SOLN
INTRAMUSCULAR | Status: AC | PRN
  Administered 2015-06-23: 50 ug via INTRAVENOUS

## 2015-06-23 MED ORDER — MIDAZOLAM HCL 2 MG/2ML IJ SOLN
INTRAMUSCULAR | Status: AC | PRN
  Administered 2015-06-23: 1 mg via INTRAVENOUS

## 2015-06-23 MED ORDER — FLUMAZENIL 0.5 MG/5ML IV SOLN
INTRAVENOUS | Status: AC
  Filled 2015-06-23: qty 5

## 2015-06-23 NOTE — Procedures (Signed)
Interventional Radiology Procedure Note  Procedure: US guided access of right upper extremity graft.  Pharmacologic and mechanical thrombectomy (4mg  tPA), with balloon angioplasty of artery anastamosis to 73mm, angioplasty of venous outflow stenosis to 50mm.    Findings:    Thrombosed to start, with restoration of flow after plasty and thrombectomy.  Stenosis of the venous outflow, angioplasty to 32mm.  Approximately 50% stenosis of brachiocephalic vein/SVC, would consider referral for treatment.  Did not discuss the risks of treating this site with the patient on today's study.   Complications: None Recommendations:  - Ok to use for dialysis - recover 1 hour before DC - Routine care   Signed,  Yvone Neu. Loreta Ave, DO

## 2015-06-23 NOTE — Sedation Documentation (Signed)
Vital signs stable. 

## 2015-06-23 NOTE — H&P (Signed)
Chief Complaint: Patient was seen in consultation today for Right arm dialysis access declot at the request of Befekadu,Belayenh  Referring Physician(s): Salomon Mast  Supervising Physician: Gilmer Mor  History of Present Illness: Charles Vasquez is a 80 y.o. male   ESRD Dialysis M/W/F RUE dialysis graft placed 07/2012---Dr Ladean Raya access working well in last use Wed 06/21/15 Didn't notice anything wrong til got to dialysis this am Could not cannalize catheter per center RN No blood flow; clotted Last declot in this access just 2 weeks ago "[place]"  Denies any other recent surgery No recent CVA  Last known intervention with IR --  2013 for LUE dialysis graft  Request for right upper arm dialysis graft thrombolysis with possible angioplasty/stent placement Possible dialysis catheter placement if needed  Past Medical History  Diagnosis Date  . Chronic kidney disease   . Stroke (HCC)   . Myocardial infarction (HCC)   . Hypertension     takes Amlodipine every other day  . Pneumonia     last yr  . Dizziness   . History of blood transfusion   . Diabetes mellitus without complication (HCC)     lantus daily and humalog sliding scale    Past Surgical History  Procedure Laterality Date  . Arteriovenous graft placement    . Toe amputation    . Av fistula placement  03/31/2012    Procedure: ARTERIOVENOUS (AV) FISTULA CREATION;  Surgeon: [name], MD;  Location: [facility] OR;  Service: Vascular;  Laterality: Right;  First stage Brachial vein transposition  . Insertion of dialysis catheter Left 05/05/2012    Procedure: INSERTION OF DIALYSIS CATHETER;  Surgeon: Chuck Hint, MD;  Location: [facility] OR;  Service: Vascular;  Laterality: Left;  . Removal of a dialysis catheter Right 05/05/2012    Procedure: REMOVAL OF A DIALYSIS CATHETER;  Surgeon: Chuck Hint, MD;  Location: [facility] OR;  Service: Vascular;  Laterality: Right;  . Av fistula placement Right  08/25/2012    Procedure: INSERTION OF ARTERIOVENOUS (AV) GORE-TEX GRAFT ARM;  Surgeon: [name], MD;  Location: [facility] OR;  Service: Vascular;  Laterality: Right;  . Ligation of arteriovenous  fistula Right 08/25/2012    Procedure: LIGATION OF ARTERIOVENOUS  FISTULA;  Surgeon: [name], MD;  Location: [facility] OR;  Service: Vascular;  Laterality: Right;  Ligation of right brachial vein transposition    Allergies: Lisinopril  Medications: Prior to Admission medications   Medication Sig Start Date End Date Taking? Authorizing Provider  amLODipine (NORVASC) 10 MG tablet Take 10 mg by mouth daily.   Yes Historical Provider, MD  aspirin EC 81 MG tablet Take 81 mg by mouth daily.   Yes Historical Provider, MD  cholecalciferol (VITAMIN D) 1000 UNITS tablet Take 2,000 Units by mouth daily.    Yes Historical Provider, MD  furosemide (LASIX) 40 MG tablet Take 40 mg by mouth 2 (two) times daily.   Yes Historical Provider, MD  insulin glargine (LANTUS) 100 UNIT/ML injection Inject 10 Units into the skin at bedtime.   Yes Historical Provider, MD  lidocaine-prilocaine (EMLA) cream Apply 1 application topically every Monday, Wednesday, and Friday with hemodialysis.    Yes Historical Provider, MD  multivitamin (RENA-VIT) TABS tablet Take 1 tablet by mouth daily.   Yes Historical Provider, MD  Dextrose, Diabetic Use, (GLUCOSE PO) Take 1 tablet by mouth once as needed. For diabetic emergencies    Historical Provider, MD  oxyCODONE-acetaminophen (PERCOCET/ROXICET) 5-325 MG per tablet  Take 1 tablet by mouth every 6 (six) hours as needed for pain. 08/25/12   Lars Mage, PA-C     Family History  Problem Relation Age of Onset  . Diabetes Mother   . Hypertension Mother   . Diabetes Father   . Hypertension Father     Social History   Social History  . Marital Status: Widowed    Spouse Name: N/A  . Number of Children: N/A  . Years of Education: N/A   Social History Main Topics  . Smoking status: Never  Smoker   . Smokeless tobacco: Never Used  . Alcohol Use: No  . Drug Use: No  . Sexual Activity: Yes   Other Topics Concern  . None   Social History Narrative      Review of Systems: A 12 point ROS discussed and pertinent positives are indicated in the HPI above.  All other systems are negative.  Review of Systems  Constitutional: Negative for fever, activity change, appetite change and fatigue.  Respiratory: Negative for cough, choking and shortness of breath.   Cardiovascular: Negative for chest pain.  Gastrointestinal: Negative for abdominal pain.  Neurological: Negative for weakness.  Psychiatric/Behavioral: Negative for behavioral problems and confusion.    Vital Signs: BP 186/96 mmHg  Pulse 72  Temp(Src) 97.6 F (36.4 C)  Resp 18  Ht 5\' 8"  (1.727 m)  Wt 150 lb (68.04 kg)  BMI 22.81 kg/m2  SpO2 100%  Physical Exam  Constitutional: He is oriented to person, place, and time.  Cardiovascular: Normal rate, regular rhythm and normal heart sounds.   Pulmonary/Chest: Effort normal and breath sounds normal. He has no wheezes.  Abdominal: Soft. Bowel sounds are normal.  Musculoskeletal: Normal range of motion.  Rt upper arm dialysis graft without thrill/pulse  Neurological: He is alert and oriented to person, place, and time.  Skin: Skin is warm and dry.  Psychiatric: He has a normal mood and affect. His behavior is normal. Judgment and thought content normal.  Nursing note and vitals reviewed.   Mallampati Score:  MD Evaluation Airway: WNL Heart: WNL Abdomen: WNL Chest/ Lungs: WNL ASA  Classification: 3 Mallampati/Airway Score: One  Imaging: No results found.  Labs:  CBC:  Recent Labs  06/23/15 1104  HGB 12.2*  HCT 36.0*    COAGS: No results for input(s): INR, APTT in the last 8760 hours.  BMP:  Recent Labs  06/23/15 1104  NA 136  K 4.9  CL 103  GLUCOSE 75  BUN 51*  CREATININE 5.90*    LIVER FUNCTION TESTS: No results for input(s):  BILITOT, AST, ALT, ALKPHOS, PROT, ALBUMIN in the last 8760 hours.  TUMOR MARKERS: No results for input(s): AFPTM, CEA, CA199, CHROMGRNA in the last 8760 hours.  Assessment and Plan:  Right upper arm dialysis graft clotted Last use Wed 4/12---without issue Request for thrombolysis with poss angioplasty/stent placement Possible dialysis catheter placement if needed Risks and Benefits discussed with the patient including, but not limited to bleeding, infection, vascular injury, pulmonary embolism, need for tunneled HD catheter placement or even death. All of the patient's questions were answered, patient is agreeable to proceed. Consent signed and in chart.   Thank you for this interesting consult.  I greatly enjoyed meeting DONTREL SMETHERS and look forward to participating in their care.  A copy of this report was sent to the requesting provider on this date.  Electronically Signed: Ralene Muskrat A 06/23/2015, 11:50 AM   I spent a total of  30 Minutes   in face to face in clinical consultation, greater than 50% of which was counseling/coordinating care for RUE dilaysis graft declot

## 2015-06-23 NOTE — Sedation Documentation (Signed)
Vital signs stable. Pt resting 

## 2015-06-23 NOTE — Sedation Documentation (Signed)
Patient is resting comfortably. No complaints at this time. Vitals stable. 

## 2015-06-23 NOTE — Sedation Documentation (Signed)
Vital signs stable. No complaints at this time. 

## 2015-06-23 NOTE — Sedation Documentation (Signed)
Patient is resting comfortably. Vitals stable. 

## 2015-06-23 NOTE — Sedation Documentation (Signed)
Patient is resting comfortably. 

## 2015-06-23 NOTE — Progress Notes (Signed)
Pt returned from IR alert and oriented.  CBG upon return was 44, gave pt his lunch consisting of pretzels, crackers, [place] sandwich apple sauce and a soda.  Rechecked CBG in 15 minutes while pt still eating and CBG was up to 63.  Pt continued eating and stated he could tell he was feeling better.  Rechecked pt CBG in another 15 minutes and it was normal at 124.  Pt states he feels better and has a monitor at home to check his sugar.  VSS and DC home with wife.

## 2015-06-23 NOTE — Sedation Documentation (Signed)
Patient is resting comfortably. Vital signs stable. No complaints at this time.

## 2015-06-23 NOTE — Discharge Instructions (Signed)
Incision Care °An incision is when a surgeon cuts into your body. After surgery, the incision needs to be cared for properly to prevent infection.  °HOW TO CARE FOR YOUR INCISION °· Take medicines only as directed by your health care provider. °· There are many different ways to close and cover an incision, including stitches, skin glue, and adhesive strips. Follow your health care provider's instructions on: °¨ Incision care. °¨ Bandage (dressing) changes and removal. °¨ Incision closure removal. °· Do not take baths, swim, or use a hot tub until your health care provider approves. You may shower as directed by your health care provider. °· Resume your normal diet and activities as directed. °· Use anti-itch medicine (such as an antihistamine) as directed by your health care provider. The incision may itch while it is healing. Do not pick or scratch at the incision. °· Drink enough fluid to keep your urine clear or pale yellow. °SEEK MEDICAL CARE IF:  °· You have drainage, redness, swelling, or pain at your incision site. °· You have muscle aches, chills, or a general ill feeling. °· You notice a bad smell coming from the incision or dressing. °· Your incision edges separate after the sutures, staples, or skin adhesive strips have been removed. °· You have persistent nausea or vomiting. °· You have a fever. °· You are dizzy. °SEEK IMMEDIATE MEDICAL CARE IF:  °· You have a rash. °· You faint. °· You have difficulty breathing. °MAKE SURE YOU:  °· Understand these instructions. °· Will watch your condition. °· Will get help right away if you are not doing well or get worse. °  °This information is not intended to replace advice given to you by your health care provider. Make sure you discuss any questions you have with your health care provider. °  °Document Released: 09/14/2004 Document Revised: 03/18/2014 Document Reviewed: 04/21/2013 °Elsevier Interactive Patient Education ©2016 Elsevier Inc. ° °

## 2015-06-23 NOTE — Progress Notes (Signed)
Attempted lab draw x 3, only enough blood to run ISTAT; IR notified

## 2015-06-23 NOTE — Sedation Documentation (Signed)
Patient is resting comfortably. No complaints at this time, vital signs stable. 

## 2016-06-07 ENCOUNTER — Other Ambulatory Visit (HOSPITAL_COMMUNITY): Payer: Self-pay | Admitting: Nephrology

## 2016-06-07 ENCOUNTER — Ambulatory Visit (HOSPITAL_COMMUNITY)
Admission: RE | Admit: 2016-06-07 | Discharge: 2016-06-07 | Disposition: A | Discharge status: Home / Self Care | Payer: Medicare Other | Source: Ambulatory Visit | Attending: Nephrology | Admitting: Nephrology

## 2016-06-07 ENCOUNTER — Other Ambulatory Visit: Payer: Self-pay | Admitting: Student

## 2016-06-07 DIAGNOSIS — T82868A Thrombosis of vascular prosthetic devices, implants and grafts, initial encounter: Secondary | ICD-10-CM | POA: Insufficient documentation

## 2016-06-07 DIAGNOSIS — Z992 Dependence on renal dialysis: Secondary | ICD-10-CM | POA: Diagnosis not present

## 2016-06-07 DIAGNOSIS — T82858A Stenosis of vascular prosthetic devices, implants and grafts, initial encounter: Secondary | ICD-10-CM | POA: Insufficient documentation

## 2016-06-07 DIAGNOSIS — Z8673 Personal history of transient ischemic attack (TIA), and cerebral infarction without residual deficits: Secondary | ICD-10-CM | POA: Diagnosis not present

## 2016-06-07 DIAGNOSIS — T82856A Stenosis of peripheral vascular stent, initial encounter: Secondary | ICD-10-CM | POA: Diagnosis not present

## 2016-06-07 DIAGNOSIS — Y831 Surgical operation with implant of artificial internal device as the cause of abnormal reaction of the patient, or of later complication, without mention of misadventure at the time of the procedure: Secondary | ICD-10-CM | POA: Insufficient documentation

## 2016-06-07 DIAGNOSIS — I252 Old myocardial infarction: Secondary | ICD-10-CM | POA: Insufficient documentation

## 2016-06-07 DIAGNOSIS — Z794 Long term (current) use of insulin: Secondary | ICD-10-CM | POA: Insufficient documentation

## 2016-06-07 DIAGNOSIS — N186 End stage renal disease: Secondary | ICD-10-CM

## 2016-06-07 DIAGNOSIS — Z7982 Long term (current) use of aspirin: Secondary | ICD-10-CM | POA: Diagnosis not present

## 2016-06-07 DIAGNOSIS — Y832 Surgical operation with anastomosis, bypass or graft as the cause of abnormal reaction of the patient, or of later complication, without mention of misadventure at the time of the procedure: Secondary | ICD-10-CM | POA: Insufficient documentation

## 2016-06-07 DIAGNOSIS — I120 Hypertensive chronic kidney disease with stage 5 chronic kidney disease or end stage renal disease: Secondary | ICD-10-CM | POA: Diagnosis not present

## 2016-06-07 DIAGNOSIS — E1122 Type 2 diabetes mellitus with diabetic chronic kidney disease: Secondary | ICD-10-CM | POA: Insufficient documentation

## 2016-06-07 HISTORY — PX: IR GENERIC HISTORICAL: IMG1180011

## 2016-06-07 HISTORY — PX: IR THROMBECTOMY AV FISTULA W/THROMBOLYSIS/PTA INC/SHUNT/IMG RIGHT: IMG6119

## 2016-06-07 LAB — POCT I-STAT 4, (NA,K, GLUC, HGB,HCT)
Glucose, Bld: 224 mg/dL — ABNORMAL HIGH (ref 65–99)
HCT: 28 % — ABNORMAL LOW (ref 39.0–52.0)
HEMOGLOBIN: 9.5 g/dL — AB (ref 13.0–17.0)
Potassium: 3.9 mmol/L (ref 3.5–5.1)
Sodium: 135 mmol/L (ref 135–145)

## 2016-06-07 LAB — GLUCOSE, CAPILLARY: Glucose-Capillary: 271 mg/dL — ABNORMAL HIGH (ref 65–99)

## 2016-06-07 MED ORDER — ALTEPLASE 2 MG IJ SOLR
INTRAMUSCULAR | Status: AC
  Administered 2016-06-07 (×2): 1 mg
  Filled 2016-06-07: qty 2

## 2016-06-07 MED ORDER — MIDAZOLAM HCL 2 MG/2ML IJ SOLN
INTRAMUSCULAR | Status: AC | PRN
  Administered 2016-06-07: 1 mg via INTRAVENOUS

## 2016-06-07 MED ORDER — LIDOCAINE HCL 1 % IJ SOLN
INTRAMUSCULAR | Status: AC | PRN
  Administered 2016-06-07: 10 mL

## 2016-06-07 MED ORDER — HEPARIN SODIUM (PORCINE) 1000 UNIT/ML IJ SOLN
INTRAMUSCULAR | Status: AC | PRN
  Administered 2016-06-07: 3000 [IU] via INTRAVENOUS

## 2016-06-07 MED ORDER — FENTANYL CITRATE (PF) 100 MCG/2ML IJ SOLN
INTRAMUSCULAR | Status: AC
  Filled 2016-06-07: qty 2

## 2016-06-07 MED ORDER — LIDOCAINE HCL (PF) 1 % IJ SOLN
INTRAMUSCULAR | Status: AC
  Filled 2016-06-07: qty 30

## 2016-06-07 MED ORDER — FENTANYL CITRATE (PF) 100 MCG/2ML IJ SOLN
INTRAMUSCULAR | Status: AC | PRN
  Administered 2016-06-07: 50 ug via INTRAVENOUS

## 2016-06-07 MED ORDER — IOPAMIDOL (ISOVUE-300) INJECTION 61%
INTRAVENOUS | Status: AC
  Filled 2016-06-07: qty 100

## 2016-06-07 MED ORDER — SODIUM CHLORIDE 0.9 % IV SOLN: INTRAVENOUS | Status: DC

## 2016-06-07 MED ORDER — SODIUM CHLORIDE 0.9 % IV SOLN
INTRAVENOUS | Status: AC | PRN
  Administered 2016-06-07: 30 mL/h via INTRAVENOUS

## 2016-06-07 MED ORDER — HEPARIN SODIUM (PORCINE) 1000 UNIT/ML IJ SOLN
INTRAMUSCULAR | Status: AC
  Filled 2016-06-07: qty 1

## 2016-06-07 MED ORDER — MIDAZOLAM HCL 2 MG/2ML IJ SOLN
INTRAMUSCULAR | Status: AC
  Filled 2016-06-07: qty 2

## 2016-06-07 MED ORDER — LOSARTAN POTASSIUM 50 MG PO TABS
100.0000 mg | ORAL_TABLET | ORAL | Status: AC
  Administered 2016-06-07: 100 mg via ORAL
  Filled 2016-06-07: qty 2

## 2016-06-07 NOTE — Sedation Documentation (Signed)
Patient is resting comfortably. 

## 2016-06-07 NOTE — Sedation Documentation (Signed)
[name] called, spoke to charge [facility].  Pt to come to dailaysis tomorrow at 11:15.  Discussed pt's BP and she states he often runs high then comes down with dialysis. K 3.9, reported as well.

## 2016-06-07 NOTE — Sedation Documentation (Signed)
O2 d/c'd 

## 2016-06-07 NOTE — Progress Notes (Signed)
Patient presented to department this afternoon for declotting procedure with elevated blood pressure that is not unusual for him.  He reported he last took his BP medication last evening (Thursday).  Procedure was successful, however over the course of the day, the patient's BP has continued to rise.  RN called dialysis center who confirms patient's blood pressure is typically high and is most improved with dialysis.  Patient was due to have dialysis today, however could not due to graft malfunction. Patient has been rescheduled for dialysis tomorrow. PA met with patient post-procedure.  He states he is feeling good and cannot feel fluctuation in BP.  He denies any concerns and states "It's always high" when asked about his BP. He confirms he takes Losartan at home and last took last PM.  PA ordered home medication of Losartan 100 mg for patient to take prior to discharge home.  Gave patient instructions regarding dialysis appointment tomorrow as well as instructed patient to go to the ED should he develop acute onset of weakness or confusion.  Brynda Greathouse, MS RD PA-C 4:50 PM

## 2016-06-07 NOTE — Sedation Documentation (Signed)
Patient is resting comfortably. CHEM 4 drawn for [facility]

## 2016-06-07 NOTE — Discharge Instructions (Signed)
Fistulogram, Care After °Refer to this sheet in the next few weeks. These instructions provide you with information on caring for yourself after your procedure. Your health care provider may also give you more specific instructions. Your treatment has been planned according to current medical practices, but problems sometimes occur. Call your health care provider if you have any problems or questions after your procedure. °What can I expect after the procedure? °After your procedure, it is typical to have the following: °· A small amount of discomfort in the area where the catheters were placed. °· A small amount of bruising around the fistula. °· Sleepiness and fatigue. °Follow these instructions at home: °· Rest at home for the day following your procedure. °· Do not drive or operate heavy machinery while taking pain medicine. °· Take medicines only as directed by your health care provider. °· Do not take baths, swim, or use a hot tub until your health care provider approves. You may shower 24 hours after the procedure or as directed by your health care provider. °· There are many different ways to close and cover an incision, including stitches, skin glue, and adhesive strips. Follow your health care provider's instructions on: °¨ Incision care. °¨ Bandage (dressing) changes and removal. °¨ Incision closure removal. °· Monitor your dialysis fistula carefully. °Contact a health care provider if: °· You have drainage, redness, swelling, or pain at your catheter site. °· You have a fever. °· You have chills. °Get help right away if: °· You feel weak. °· You have trouble balancing. °· You have trouble moving your arms or legs. °· You have problems with your speech or vision. °· You can no longer feel a vibration or buzz when you put your fingers over your dialysis fistula. °· The limb that was used for the procedure: °¨ Swells. °¨ Is painful. °¨ Is cold. °¨ Is discolored, such as blue or pale white. °This information  is not intended to replace advice given to you by your health care provider. Make sure you discuss any questions you have with your health care provider. °Document Released: 07/12/2013 Document Revised: 08/03/2015 Document Reviewed: 04/16/2013 °Elsevier Interactive Patient Education © 2017 Elsevier Inc. ° °

## 2016-06-07 NOTE — Sedation Documentation (Signed)
Dr Deanne Coffer in to see pt and review procedure, ready to move to IR.

## 2016-06-07 NOTE — Sedation Documentation (Signed)
DR Deanne Coffer aware of BP

## 2016-06-07 NOTE — Progress Notes (Signed)
Report from [facility] /radiology. bp elevated/ dr Rica Records aware, was high through case, no orders followed.

## 2016-06-07 NOTE — H&P (Signed)
Chief Complaint: Patient was seen in consultation today for clotted AV graft  Referring Physician(s):  Dr. Salomon Mast  Supervising Physician: Oley Balm  Patient Status: [facility] - Out-pt  History of Present Illness: Charles Vasquez is a 81 y.o. male with past medical history of DM2, HTN, stroke, and CKD presented to his dialysis center today for routine dialysis. The center could not palpate thrill over graft and could not access for dialysis.  The patient last underwent declotting procedure with Dr. Juel Burrow on Monday for clotted graft.  He was able to get full HD session on Wednesday without issues.  Patient was last seen in IR for graft thrombosis and malfunction in April 2017.   IR consulted for clotted AV graft in dialysis dependent patient.  He was not able to get dialysis today.     Past Medical History:  Diagnosis Date  . Chronic kidney disease   . Diabetes mellitus without complication (HCC)    lantus daily and humalog sliding scale  . Dizziness   . History of blood transfusion   . Hypertension    takes Amlodipine every other day  . Myocardial infarction   . Pneumonia    last yr  . Stroke [facility])     Past Surgical History:  Procedure Laterality Date  . ARTERIOVENOUS GRAFT PLACEMENT    . AV FISTULA PLACEMENT  03/31/2012   Procedure: ARTERIOVENOUS (AV) FISTULA CREATION;  Surgeon: [name], MD;  Location: [facility] OR;  Service: Vascular;  Laterality: Right;  First stage Brachial vein transposition  . AV FISTULA PLACEMENT Right 08/25/2012   Procedure: INSERTION OF ARTERIOVENOUS (AV) GORE-TEX GRAFT ARM;  Surgeon: [name], MD;  Location: [facility] OR;  Service: Vascular;  Laterality: Right;  . INSERTION OF DIALYSIS CATHETER Left 05/05/2012   Procedure: INSERTION OF DIALYSIS CATHETER;  Surgeon: Chuck Hint, MD;  Location: [facility] OR;  Service: Vascular;  Laterality: Left;  . LIGATION OF ARTERIOVENOUS  FISTULA Right 08/25/2012   Procedure: LIGATION OF ARTERIOVENOUS   FISTULA;  Surgeon: [name], MD;  Location: [facility] OR;  Service: Vascular;  Laterality: Right;  Ligation of right brachial vein transposition  . REMOVAL OF A DIALYSIS CATHETER Right 05/05/2012   Procedure: REMOVAL OF A DIALYSIS CATHETER;  Surgeon: Chuck Hint, MD;  Location: [facility] OR;  Service: Vascular;  Laterality: Right;  . TOE AMPUTATION      Allergies: Lisinopril  Medications: Prior to Admission medications   Medication Sig Start Date End Date Taking? Authorizing Provider  amLODipine (NORVASC) 10 MG tablet Take 10 mg by mouth daily.    Historical Provider, MD  aspirin EC 81 MG tablet Take 81 mg by mouth daily.    Historical Provider, MD  cholecalciferol (VITAMIN D) 1000 UNITS tablet Take 2,000 Units by mouth daily.     Historical Provider, MD  Dextrose, Diabetic Use, (GLUCOSE PO) Take 1 tablet by mouth once as needed. For diabetic emergencies    Historical Provider, MD  furosemide (LASIX) 40 MG tablet Take 40 mg by mouth 2 (two) times daily.    Historical Provider, MD  insulin glargine (LANTUS) 100 UNIT/ML injection Inject 10 Units into the skin at bedtime.    Historical Provider, MD  lidocaine-prilocaine (EMLA) cream Apply 1 application topically every Monday, Wednesday, and Friday with hemodialysis.     Historical Provider, MD  multivitamin (RENA-VIT) TABS tablet Take 1 tablet by mouth daily.    Historical Provider, MD  oxyCODONE-acetaminophen (PERCOCET/ROXICET) 5-325 MG per tablet  Take 1 tablet by mouth every 6 (six) hours as needed for pain. 08/25/12   Lars Mage, PA-C     Family History  Problem Relation Age of Onset  . Diabetes Mother   . Hypertension Mother   . Diabetes Father   . Hypertension Father     Social History   Social History  . Marital status: Widowed    Spouse name: N/A  . Number of children: N/A  . Years of education: N/A   Social History Main Topics  . Smoking status: Never Smoker  . Smokeless tobacco: Never Used  . Alcohol use No  . Drug  use: No  . Sexual activity: Yes   Other Topics Concern  . Not on file   Social History Narrative  . No narrative on file    Review of Systems  Constitutional: Negative for fatigue and fever.  Respiratory: Negative for cough and shortness of breath.   Cardiovascular: Negative for chest pain.  Psychiatric/Behavioral: Negative for behavioral problems and confusion.    Vital Signs: BP (!) 164/96   Pulse 74   Temp 97.2 F (36.2 C)   Resp 18   Ht 5\' 8"  (1.727 m)   Wt 145 lb (65.8 kg)   SpO2 97%   BMI 22.05 kg/m   Physical Exam  Constitutional: He is oriented to person, place, and time. He appears well-developed.  Cardiovascular: Normal rate, regular rhythm and normal heart sounds.   Pulmonary/Chest: Effort normal and breath sounds normal. No respiratory distress.  Neurological: He is alert and oriented to person, place, and time.  Skin:  RUA AV graft.  No palpable thrill. No warmth or erythema.   Psychiatric: He has a normal mood and affect. His behavior is normal. Judgment and thought content normal.  Nursing note and vitals reviewed.   Mallampati Score:  MD Evaluation Airway: WNL Heart: WNL Abdomen: WNL Chest/ Lungs: WNL ASA  Classification: 3 Mallampati/Airway Score: Two  Imaging: No results found.  Labs:  CBC:  Recent Labs  06/23/15 1104  HGB 12.2*  HCT 36.0*    COAGS: No results for input(s): INR, APTT in the last 8760 hours.  BMP:  Recent Labs  06/23/15 1104  NA 136  K 4.9  CL 103  GLUCOSE 75  BUN 51*  CREATININE 5.90*    LIVER FUNCTION TESTS: No results for input(s): BILITOT, AST, ALT, ALKPHOS, PROT, ALBUMIN in the last 8760 hours.  TUMOR MARKERS: No results for input(s): AFPTM, CEA, CA199, CHROMGRNA in the last 8760 hours.  Assessment and Plan: Clotted AV graft IR consulted for thrombectomy/thrombolysis and with possible temp cath placement.  Patient states he had graft malfunction on Monday and presented to Dr. Juel Burrow for a  stent placement.  He reports he was able to successful undergo dialysis on Wednesday for a full session.  There is no available documentation in Epic for procedure on Monday.  Patient has undergone declotting procedures in the past.  Last declotting procedure with IR was in April 2017.  Will check labwork.  Patient was unable to get dialysis done this AM due to graft malfunction.  Risks and benefits discussed with the patient including, but not limited to bleeding, infection, vascular injury, pulmonary embolism, need for tunneled HD catheter placement or even death. All of the patient's questions were answered, patient is agreeable to proceed. Consent signed and in chart.   Thank you for this interesting consult.  I greatly enjoyed meeting Charles Vasquez and look forward to  participating in their care.  A copy of this report was sent to the requesting provider on this date.  Electronically Signed: Hoyt Koch 06/07/2016, 12:41 PM   I spent a total of    15 Minutes in face to face in clinical consultation, greater than 50% of which was counseling/coordinating care for clotted AV graft.

## 2016-06-07 NOTE — Procedures (Signed)
Declot R arm HD graft, venous PTA No complication No blood loss. See complete dictation in [facility].

## 2016-06-08 ENCOUNTER — Encounter (HOSPITAL_COMMUNITY): Payer: Self-pay | Admitting: Interventional Radiology

## 2016-06-10 ENCOUNTER — Other Ambulatory Visit (HOSPITAL_COMMUNITY): Payer: Self-pay | Admitting: Nephrology

## 2016-06-10 ENCOUNTER — Ambulatory Visit (HOSPITAL_COMMUNITY)
Admission: RE | Admit: 2016-06-10 | Discharge: 2016-06-10 | Disposition: A | Discharge status: Home / Self Care | Payer: Medicare Other | Source: Ambulatory Visit | Attending: Nephrology | Admitting: Nephrology

## 2016-06-10 ENCOUNTER — Encounter (HOSPITAL_COMMUNITY): Payer: Self-pay

## 2016-06-10 DIAGNOSIS — I252 Old myocardial infarction: Secondary | ICD-10-CM | POA: Insufficient documentation

## 2016-06-10 DIAGNOSIS — Z4901 Encounter for fitting and adjustment of extracorporeal dialysis catheter: Secondary | ICD-10-CM | POA: Diagnosis not present

## 2016-06-10 DIAGNOSIS — N186 End stage renal disease: Secondary | ICD-10-CM | POA: Diagnosis not present

## 2016-06-10 DIAGNOSIS — E1122 Type 2 diabetes mellitus with diabetic chronic kidney disease: Secondary | ICD-10-CM | POA: Diagnosis not present

## 2016-06-10 DIAGNOSIS — I120 Hypertensive chronic kidney disease with stage 5 chronic kidney disease or end stage renal disease: Secondary | ICD-10-CM | POA: Diagnosis not present

## 2016-06-10 DIAGNOSIS — Z8673 Personal history of transient ischemic attack (TIA), and cerebral infarction without residual deficits: Secondary | ICD-10-CM | POA: Diagnosis not present

## 2016-06-10 DIAGNOSIS — Z794 Long term (current) use of insulin: Secondary | ICD-10-CM | POA: Insufficient documentation

## 2016-06-10 HISTORY — PX: IR FLUORO GUIDE CV LINE RIGHT: IMG2283

## 2016-06-10 HISTORY — PX: IR US GUIDE VASC ACCESS RIGHT: IMG2390

## 2016-06-10 MED ORDER — FENTANYL CITRATE (PF) 100 MCG/2ML IJ SOLN
INTRAMUSCULAR | Status: AC | PRN
  Administered 2016-06-10: 50 ug via INTRAVENOUS

## 2016-06-10 MED ORDER — HEPARIN SODIUM (PORCINE) 1000 UNIT/ML IJ SOLN
INTRAMUSCULAR | Status: AC
  Filled 2016-06-10: qty 1

## 2016-06-10 MED ORDER — LIDOCAINE HCL (PF) 1 % IJ SOLN
INTRAMUSCULAR | Status: AC | PRN
  Administered 2016-06-10: 20 mL

## 2016-06-10 MED ORDER — GELATIN ABSORBABLE 12-7 MM EX MISC
CUTANEOUS | Status: AC | PRN
  Administered 2016-06-10: 1 via TOPICAL

## 2016-06-10 MED ORDER — LIDOCAINE HCL (PF) 1 % IJ SOLN
INTRAMUSCULAR | Status: AC
  Filled 2016-06-10: qty 30

## 2016-06-10 MED ORDER — CEFAZOLIN SODIUM-DEXTROSE 2-4 GM/100ML-% IV SOLN
INTRAVENOUS | Status: AC
  Administered 2016-06-10: 2000 mg via INTRAVENOUS
  Filled 2016-06-10: qty 100

## 2016-06-10 MED ORDER — FENTANYL CITRATE (PF) 100 MCG/2ML IJ SOLN
INTRAMUSCULAR | Status: AC
  Filled 2016-06-10: qty 2

## 2016-06-10 MED ORDER — MIDAZOLAM HCL 2 MG/2ML IJ SOLN
INTRAMUSCULAR | Status: AC | PRN
  Administered 2016-06-10: 1 mg via INTRAVENOUS

## 2016-06-10 MED ORDER — GELATIN ABSORBABLE 12-7 MM EX MISC
CUTANEOUS | Status: AC
  Filled 2016-06-10: qty 1

## 2016-06-10 MED ORDER — IOPAMIDOL (ISOVUE-300) INJECTION 61%
INTRAVENOUS | Status: AC
  Filled 2016-06-10: qty 50

## 2016-06-10 MED ORDER — MIDAZOLAM HCL 2 MG/2ML IJ SOLN
INTRAMUSCULAR | Status: AC
  Filled 2016-06-10: qty 2

## 2016-06-10 NOTE — Sedation Documentation (Signed)
Patient is resting comfortably. 

## 2016-06-10 NOTE — Progress Notes (Signed)
Patient discharged to home with girlfriend. VSS. Discharge instructions given to patient and he verbalized understanding.

## 2016-06-10 NOTE — H&P (Signed)
Chief Complaint: non-functioning AVG  Referring Physician: Dr. Colan Neptune  Supervising Physician: Gilmer Mor  Patient Status: [facility] - Out-pt  HPI: Charles Vasquez is an 81 y.o. male who is known to IR for recently declot last week.  Unfortunately, it has clotted again.  We have been asked to see him for placement of a tunneled HD catheter so he can get HD.  Nothing has changed about his medical history since we saw him on Friday.  Past Medical History:  Past Medical History:  Diagnosis Date  . Chronic kidney disease   . Diabetes mellitus without complication (HCC)    lantus daily and humalog sliding scale  . Dizziness   . History of blood transfusion   . Hypertension    takes Amlodipine every other day  . Myocardial infarction   . Pneumonia    last yr  . Stroke [facility])     Past Surgical History:  Past Surgical History:  Procedure Laterality Date  . ARTERIOVENOUS GRAFT PLACEMENT    . AV FISTULA PLACEMENT  03/31/2012   Procedure: ARTERIOVENOUS (AV) FISTULA CREATION;  Surgeon: [name], MD;  Location: [facility] OR;  Service: Vascular;  Laterality: Right;  First stage Brachial vein transposition  . AV FISTULA PLACEMENT Right 08/25/2012   Procedure: INSERTION OF ARTERIOVENOUS (AV) GORE-TEX GRAFT ARM;  Surgeon: [name], MD;  Location: [facility] OR;  Service: Vascular;  Laterality: Right;  . INSERTION OF DIALYSIS CATHETER Left 05/05/2012   Procedure: INSERTION OF DIALYSIS CATHETER;  Surgeon: Chuck Hint, MD;  Location: [facility] OR;  Service: Vascular;  Laterality: Left;  . IR GENERIC HISTORICAL  06/07/2016   IR DECLOT THROMB AGENT IMPLANT VASC DEVICE/CATH 06/07/2016 Oley Balm, MD [facility]-INTERV RAD  . IR GENERIC HISTORICAL  06/07/2016   IR US GUIDE VASC ACCESS RIGHT 06/07/2016 Oley Balm, MD [facility]-INTERV RAD  . LIGATION OF ARTERIOVENOUS  FISTULA Right 08/25/2012   Procedure: LIGATION OF ARTERIOVENOUS  FISTULA;  Surgeon: [name], MD;  Location: [facility] OR;  Service: Vascular;   Laterality: Right;  Ligation of right brachial vein transposition  . REMOVAL OF A DIALYSIS CATHETER Right 05/05/2012   Procedure: REMOVAL OF A DIALYSIS CATHETER;  Surgeon: Chuck Hint, MD;  Location: [facility] OR;  Service: Vascular;  Laterality: Right;  . TOE AMPUTATION      Family History:  Family History  Problem Relation Age of Onset  . Diabetes Mother   . Hypertension Mother   . Diabetes Father   . Hypertension Father     Social History:  reports that he has never smoked. He has never used smokeless tobacco. He reports that he does not drink alcohol or use drugs.  Allergies:  Allergies  Allergen Reactions  . Lisinopril Other (See Comments)    REACTION: hyperkalemia/renal deterioration    Medications: Medications reviewed in epic  Please HPI for pertinent positives, otherwise complete 10 system ROS negative.  Mallampati Score: MD Evaluation Airway: WNL Heart: WNL Abdomen: WNL Chest/ Lungs: WNL ASA  Classification: 3 Mallampati/Airway Score: One  Physical Exam: BP (!) 217/103   Pulse 66   Temp 97.7 F (36.5 C) (Oral)   Resp 16   Ht 5\' 7"  (1.702 m)   Wt 145 lb (65.8 kg)   SpO2 100%   BMI 22.71 kg/m  Body mass index is 22.71 kg/m. General: pleasant, WD, WN black male who is laying in bed in NAD HEENT: head is normocephalic, atraumatic.  Sclera are noninjected.  PERRL.  Ears and nose without any masses or lesions.  Mouth is pink and moist Heart: regular, rate, and rhythm, few ectopic beats.  Normal s1,s2. No obvious murmurs, gallops, or rubs noted.  Palpable radial pulses bilaterally Lungs: CTAB, no wheezes, rhonchi, or rales noted.  Respiratory effort nonlabored Abd: soft, NT, ND, +BS, no masses, hernias, or organomegaly Skin: no thrill noted in RUE AV graft Psych: A&Ox3 with an appropriate affect.   Labs: No results found for this or any previous visit (from the past 48 hour(s)).  Imaging: No results found.  Assessment/Plan 1. Clotted AVG -we  will plan to place a tunneled HD catheter today so the patient can get HD given his graft has stopped functioning again. -we will not repeat labs today as he just had labs on Friday -he last ate a half of a bag of potato chips this morning at 0600.  Otherwise he has been NPO he says. -denies blood thinners Risks and Benefits discussed with the patient including, but not limited to bleeding, infection, vascular injury, pneumothorax which may require chest tube placement, air embolism or even death All of the patient's questions were answered, patient is agreeable to proceed. Consent signed and in chart.  Thank you for this interesting consult.  I greatly enjoyed meeting Charles Vasquez and look forward to participating in their care.  A copy of this report was sent to the requesting provider on this date.  Electronically Signed: Letha Cape 06/10/2016, 12:40 PM   I spent a total of    25 Minutes in face to face in clinical consultation, greater than 50% of which was counseling/coordinating care for clotted AVG

## 2016-06-10 NOTE — Procedures (Signed)
Interventional Radiology Procedure Note  Procedure: Placement of a right EJ approach Tunneled HD catheter.  23cm tip to cuff. Tip is positioned at the superior cavoatrial junction and catheter is ready for immediate use.  Complications: None Recommendations:  - Ok to shower tomorrow - Do not submerge   - Routine care  - Right IJ occluded.   Signed,  Yvone Neu. Loreta Ave, DO

## 2016-06-10 NOTE — Discharge Instructions (Addendum)
Tunneled Catheter Insertion, Care After  Refer to this sheet in the next few weeks. These instructions provide you with information about caring for yourself after your procedure. Your health care provider may also give you more specific instructions. Your treatment has been planned according to current medical practices, but problems sometimes occur. Call your health care provider if you have any problems or questions after your procedure.  What can I expect after the procedure?  After the procedure, it is common to have:  · Some mild redness, swelling, and pain around your catheter site.  · A small amount of blood or clear fluid coming from your incisions.    Follow these instructions at home:  Incision care   · Check your incision areas every day for signs of infection. Check for:  ? More redness, swelling, or pain.  ? More fluid or blood.  ? Warmth.  ? Pus or a bad smell.  · Follow instructions from your health care provider about how to take care of your incisions. Make sure you:  ? Wash your hands with soap and water before you change your bandages (dressings). If soap and water are not available, use hand sanitizer.  ? Change your dressings as told by your health care provider. Wash the area around your incisions with a germ-killing (antiseptic) solution when you change your dressing, as told by your health care provider.  ? Leave stitches (sutures), skin glue, or adhesive strips in place. These skin closures may need to stay in place for 2 weeks or longer. If adhesive strip edges start to loosen and curl up, you may trim the loose edges. Do not remove adhesive strips completely unless your health care provider tells you to do that.  Catheter Care     · Wash your hands with soap and water before and after caring for your catheter. If soap and water are not available, use hand sanitizer.  · Keep your catheter site and your dressings clean and dry.  · Apply an antibiotic ointment to your catheter site as told  by your health care provider.  · Flush your catheter as told by your health care provider. This helps prevent it from becoming clogged.  · Do not open the caps on the ends of the catheter.  · Do not pull on your catheter.  · If your catheter is in your arm:  ? Avoid wearing tight clothes or tight jewelry on your arm that has the catheter.  ? Do not sleep with your head on the arm that has the catheter.  ? Do not allow your blood pressure to be taken on the arm that has the catheter.  ? Do not allow your blood to be drawn from the arm that has the catheter, except through the catheter itself.  Medicines   · Take over-the-counter and prescription medicines only as told by your health care provider.  · If you were prescribed an antibiotic medicine, take it as told by your health care provider. Do not stop taking the antibiotic even if you start to feel better.  Activity   · Return to your normal activities as told by your health care provider. Ask your health care provider what activities are safe for you.  · Do not lift anything that is heavier than 10 lb (4.5 kg) for 3 weeks or as long as told by your health care provider.  Driving   · Do not drive until your health care provider approves.  ·   provider approves. °· Follow instructions from your health care provider about eating or drinking restrictions. °· Wear compression stockings as told by your health care provider. These stockings help to prevent blood clots and reduce swelling in your legs. °· Keep all follow-up visits as told by your health care provider. This is important. °Contact a health care provider if: °· You have more fluid or blood coming from your incisions. °· You have more redness,  swelling, or pain at your incisions or around the area where your catheter is inserted. °· Your incisions feel warm to the touch. °· You feel unusually weak. °· You feel nauseous. °· Your catheter is not working properly. °· You have blood or fluid draining from your catheter. °· You are unable to flush your catheter. °Get help right away if: °· Your catheter breaks. °· A hole develops in your catheter. °· Your catheter comes loose or gets pulled completely out. If this happens, press on your catheter site firmly with your hand or a clean cloth until you get medical help. °· Your catheter becomes blocked. °· You have swelling in your arm, shoulder, neck, or face. °· You develop chest pain. °· You have difficulty breathing. °· You feel dizzy or light-headed. °· You have pus or a bad smell coming from your incisions. °· You have a fever. °· You develop bleeding from your catheter or your insertion site, and your bleeding does not stop. °This information is not intended to replace advice given to you by your health care provider. Make sure you discuss any questions you have with your health care provider. °Document Released: 02/12/2012 Document Revised: 10/29/2015 Document Reviewed: 11/21/2014 °Elsevier Interactive Patient Education © 2017 Elsevier Inc[employer] ° ° °Moderate Conscious Sedation, Adult, Care After °These instructions provide you with information about caring for yourself after your procedure. Your health care provider may also give you more specific instructions. Your treatment has been planned according to current medical practices, but problems sometimes occur. Call your health care provider if you have any problems or questions after your procedure. °What can I expect after the procedure? °After your procedure, it is common: °· To feel sleepy for several hours. °· To feel clumsy and have poor balance for several hours. °· To have poor judgment for several hours. °· To vomit if you eat too soon. °Follow these  instructions at home: °For at least 24 hours after the procedure:  °· Do not: °¨ Participate in activities where you could fall or become injured. °¨ Drive. °¨ Use heavy machinery. °¨ Drink alcohol. °¨ Take sleeping pills or medicines that cause drowsiness. °¨ Make important decisions or sign legal documents. °¨ Take care of children on your own. °· Rest. °Eating and drinking °· Follow the diet recommended by your health care provider. °· If you vomit: °¨ Drink water, juice, or soup when you can drink without vomiting. °¨ Make sure you have little or no nausea before eating solid foods. °General instructions °· Have a responsible adult stay with you until you are awake and alert. °· Take over-the-counter and prescription medicines only as told by your health care provider. °· If you smoke, do not smoke without supervision. °· Keep all follow-up visits as told by your health care provider. This is important. °Contact a health care provider if: °· You keep feeling nauseous or you keep vomiting. °· You feel light-headed. °· You develop a rash. °· You have a fever. °Get help right away if: °· You have trouble   right away if:  You have trouble breathing. This information is not intended to replace advice given to you by your health care provider. Make sure you discuss any questions you have with your health care provider. Document Released: 12/16/2012 Document Revised: 07/31/2015 Document Reviewed: 06/17/2015 Elsevier Interactive Patient Education  2017 [employer] Inc[employer] Vascular Access for Hemodialysis A vascular access is a connection between two blood vessels that allows blood to be easily removed from the body and returned to the body during hemodialysis. Hemodialysis is a procedure in which a machine outside of the body filters the blood. There are three types of vascular accesses:  Arteriovenous fistula. This is a connection between an artery and a vein (usually in the arm) that is made by sewing them together. Blood in the artery flows  directly into the vein, causing it to get larger over time. This makes it easier for the vein to be used for hemodialysis. An arteriovenous fistula takes 1-6 months to develop after surgery.  Arteriovenous graft. This is a connection between an artery and a vein in the arm that is made with a tube. An arteriovenous graft can be used within 2-3 weeks of surgery.  Venous catheter. This is a thin, flexible tube that is placed in a large vein (usually in the neck, chest, or groin). A venous catheter for hemodialysis contains two tubes that come out of the skin. A venous catheter can be used right away. It is usually used as a temporary access if you need hemodialysis before a fistula or graft has developed. It may also be used as a permanent access if a fistula or graft cannot be created. Which type of access is best for me? The type of access that is best for you depends on the size and strength of your veins. A fistula is usually the preferred type of access. It can last several years and is less likely than the other types of accesses to become infected or to cause blood clots within a blood vessel (thrombosis). However, a fistula is not an option for everyone. If your veins are not the right size, a graft may be used instead. Grafts require you to have strong veins. If your veins are not strong enough for a graft, a catheter may be used. Catheters are more likely than fistulas and grafts to become infected or to have thrombosis. Sometimes, only one type of access is an option. Your health care provider will help you determine which type of access is best for you. How is a vascular access used? The way the access is used depends on the type of access:  If the access is a fistula or graft, two needles are inserted through the skin into the access before each hemodialysis session. Blood leaves the body through one of the needles and travels through a tube to the hemodialysis machine (dialyzer). It then  flows through another tube and returns to the body through the second needle.  If the access is a catheter, one tube is connected directly to the tube that leads to the dialyzer and the other is connected to a tube that leads away from the dialyzer. Blood leaves the body through one tube and returns to the body through the other. What kind of problems can occur with vascular accesses?  Blood clots within a blood vessel (thrombosis). Thrombosis can lead to a narrowing of a blood vessel or tube (stenosis). If thrombosis occurs frequently, another access site may be created as a backup.  Infection. These problems are most likely to occur with a venous catheter and least likely to occur with an arteriovenous fistula. How do I care for my vascular access? Wear a medical alert bracelet. This tells health care providers that you are a dialysis patient in the case of an emergency and allows them to care for your veins appropriately. If you have a graft or fistula:  A "bruit" is a noise that is heard with a stethoscope and a "thrill" is a vibration felt over the graft or fistula. The presence of the bruit and thrill indicates that the access is working. You will be taught to feel for the thrill each day. If this is not felt, the access may be clotted. Call your health care provider.  You may use the arm where your vascular access is located freely after the site heals. Keep the following in mind:  Avoid pressure on the arm.  Avoid lifting heavy objects with the arm.  Avoid sleeping on the arm.  Avoid wearing tight-sleeved shirts or jewelry around the graft or fistula.  Do not allow blood pressure monitoring or needle punctures on the side where the graft or fistula is located.  With permission from your health care provider, you may do exercises to help with blood flow through a fistula. These exercises involve squeezing a rubber ball or other soft objects as instructed. Contact a health care  provider if:  Chills develop.  You have an oral temperature above 102 F (38.9 C).  Swelling around the graft or fistula gets worse.  New pain develops.  Pus or other fluid (drainage) is seen at the vascular access site.  Skin redness or red streaking is seen on the skin around, above, or below the vascular access. Get help right away if:  Pain, numbness, or an unusual pale skin color develops in the hand on the side of your fistula.  Dizziness or weakness develops that you have not had before.  The vascular access has bleeding that cannot be easily controlled. This information is not intended to replace advice given to you by your health care provider. Make sure you discuss any questions you have with your health care provider. Document Released: 05/18/2002 Document Revised: 08/03/2015 Document Reviewed: 07/14/2012 Elsevier Interactive Patient Education  2017 [employer].

## 2016-06-12 ENCOUNTER — Other Ambulatory Visit (HOSPITAL_COMMUNITY): Payer: Self-pay | Admitting: Nephrology

## 2016-06-12 ENCOUNTER — Encounter (HOSPITAL_COMMUNITY): Payer: Self-pay

## 2016-06-12 ENCOUNTER — Other Ambulatory Visit: Payer: Self-pay | Admitting: Vascular Surgery

## 2016-06-12 DIAGNOSIS — N186 End stage renal disease: Secondary | ICD-10-CM

## 2016-06-12 DIAGNOSIS — T82590D Other mechanical complication of surgically created arteriovenous fistula, subsequent encounter: Secondary | ICD-10-CM

## 2016-07-02 ENCOUNTER — Encounter: Payer: Self-pay | Admitting: Vascular Surgery

## 2016-07-09 ENCOUNTER — Encounter: Payer: Self-pay | Admitting: Vascular Surgery

## 2016-07-09 ENCOUNTER — Other Ambulatory Visit: Payer: Self-pay

## 2016-07-09 ENCOUNTER — Ambulatory Visit (HOSPITAL_COMMUNITY)
Admission: RE | Admit: 2016-07-09 | Discharge: 2016-07-09 | Disposition: A | Discharge status: Home / Self Care | Payer: Medicare Other | Source: Ambulatory Visit | Attending: Vascular Surgery | Admitting: Vascular Surgery

## 2016-07-09 ENCOUNTER — Ambulatory Visit (INDEPENDENT_AMBULATORY_CARE_PROVIDER_SITE_OTHER): Payer: Medicare Other | Admitting: Vascular Surgery

## 2016-07-09 VITALS — BP 149/84 | HR 77 | Temp 96.7°F | Resp 20 | Ht 67.0 in | Wt 149.0 lb

## 2016-07-09 DIAGNOSIS — X58XXXD Exposure to other specified factors, subsequent encounter: Secondary | ICD-10-CM | POA: Insufficient documentation

## 2016-07-09 DIAGNOSIS — T82590D Other mechanical complication of surgically created arteriovenous fistula, subsequent encounter: Secondary | ICD-10-CM | POA: Insufficient documentation

## 2016-07-09 DIAGNOSIS — N186 End stage renal disease: Secondary | ICD-10-CM

## 2016-07-09 NOTE — Progress Notes (Signed)
  [facility] [place]  Patient name: Charles Vasquez MRN: [id number] DOB: 09/24/1935 Sex: male  REASON FOR CONSULT: Discuss options for hemodialysis access  HPI: Charles Vasquez is a 81 y.o. male, who is seen today for discussion of access for hemodialysis. He had a right upper arm graft placed by Dr. Chen in 2014. He is a good use of this since that time. He recently had occlusion of this and now is undergoing dialysis via a right IJ catheter. He seems is today to discuss further access options. He does have a left upper arm graft that the patient reports was placed in [place] years ago. Apparently never had any function out of this graft. He has no history of lower extremity peripheral vascular occlusive disease. Did have some wound problems and toes of his left foot with some amputations that healed in the past.  Past Medical History:  Diagnosis Date  . Chronic kidney disease   . Diabetes mellitus without complication (HCC)    lantus daily and humalog sliding scale  . Dizziness   . History of blood transfusion   . Hypertension    takes Amlodipine every other day  . Myocardial infarction (HCC)   . Pneumonia    last yr  . Stroke (HCC)     Family History  Problem Relation Age of Onset  . Diabetes Mother   . Hypertension Mother   . Diabetes Father   . Hypertension Father     SOCIAL HISTORY: Social History   Social History  . Marital status: Widowed    Spouse name: N/A  . Number of children: N/A  . Years of education: N/A   Occupational History  . Not on file.   Social History Main Topics  . Smoking status: Never Smoker  . Smokeless tobacco: Never Used  . Alcohol use No  . Drug use: No  . Sexual activity: Yes   Other Topics Concern  . Not on file   Social History Narrative  . No narrative on file    Allergies  Allergen Reactions  . Lisinopril Other (See Comments)    REACTION: hyperkalemia/renal  deterioration    Current Outpatient Prescriptions  Medication Sig Dispense Refill  . insulin glargine (LANTUS) 100 UNIT/ML injection Inject 5-10 Units into the skin at bedtime. Per BS    . PRESCRIPTION MEDICATION Take 1 tablet by mouth daily. BP medication     No current facility-administered medications for this visit.     REVIEW OF SYSTEMS:  [X] denotes positive finding, [ ] denotes negative finding Cardiac  Comments:  Chest pain or chest pressure: x   Shortness of breath upon exertion: x   Short of breath when lying flat:    Irregular heart rhythm:        Vascular    Pain in calf, thigh, or hip brought on by ambulation:    Pain in feet at night that wakes you up from your sleep:  x   Blood clot in your veins:    Leg swelling:         Pulmonary    Oxygen at home:    Productive cough:     Wheezing:         Neurologic    Sudden weakness in arms or legs:     Sudden numbness in arms or legs:     Sudden onset of difficulty speaking or slurred speech:    Temporary loss of vision in one eye:       Problems with dizziness:         Gastrointestinal    Blood in stool:     Vomited blood:         Genitourinary    Burning when urinating:     Blood in urine:        Psychiatric    Major depression:         Hematologic    Bleeding problems:    Problems with blood clotting too easily:        Skin    Rashes or ulcers:        Constitutional    Fever or chills:      PHYSICAL EXAM: Vitals:   07/09/16 1039  BP: (!) 149/84  Pulse: 77  Resp: 20  Temp: (!) 96.7 F (35.9 C)  TempSrc: Oral  SpO2: 99%  Weight: 149 lb (67.6 kg)  Height: 5' 7" (1.702 m)    GENERAL: The patient is a well-nourished male, in no acute distress. The vital signs are documented above. CARDIOVASCULAR: Palpable radial pulses bilaterally. Occluded bilateral upper extremity Gore-Tex graft. The patient has marked tributary veins over his right shoulder and marked distention of his external jugular  veins in his left neck. PULMONARY: There is good air exchange  ABDOMEN: Soft and non-tender  MUSCULOSKELETAL: There are no major deformities or cyanosis. NEUROLOGIC: No focal weakness or paresthesias are detected. SKIN: There are no ulcers or rashes noted. PSYCHIATRIC: The patient has a normal affect.  DATA:    Scan shows occlusion with minimal flow in the outflow vein.  MEDICAL ISSUES: Incisions disease with failed right upper arm AV graft after 4 years. Will need new access. I'm concerned regarding his venous distention on the surface veins of his right chest and left neck. May have central venous occlusion. Will need bilateral upper extremity venograms prior to proceeding with access placement. I did explain the potential for lower extremity grafts. He is quite resistant to this. He knows individual is at the dialysis center who had bad outcomes and death following leg graft placement. Explained this would be quite unusual that we would not make any further recommendations until we have bilateral upper extremity venograms which will be scheduled as an outpatient   Edmon Magid F. Lashun Mccants, MD FACS [facility] [place] Office Tel [phone] Pager [phone]    

## 2016-07-18 ENCOUNTER — Other Ambulatory Visit: Payer: Self-pay | Admitting: *Deleted

## 2016-07-18 ENCOUNTER — Encounter (HOSPITAL_COMMUNITY)
Admission: RE | Disposition: A | Discharge status: Home / Self Care | Payer: Self-pay | Source: Ambulatory Visit | Attending: Vascular Surgery

## 2016-07-18 ENCOUNTER — Encounter (HOSPITAL_COMMUNITY): Payer: Self-pay | Admitting: *Deleted

## 2016-07-18 ENCOUNTER — Ambulatory Visit (HOSPITAL_COMMUNITY)
Admission: RE | Admit: 2016-07-18 | Discharge: 2016-07-18 | Disposition: A | Discharge status: Home / Self Care | Payer: Medicare Other | Source: Ambulatory Visit | Attending: Vascular Surgery | Admitting: Vascular Surgery

## 2016-07-18 DIAGNOSIS — I120 Hypertensive chronic kidney disease with stage 5 chronic kidney disease or end stage renal disease: Secondary | ICD-10-CM | POA: Diagnosis not present

## 2016-07-18 DIAGNOSIS — E1122 Type 2 diabetes mellitus with diabetic chronic kidney disease: Secondary | ICD-10-CM | POA: Diagnosis present

## 2016-07-18 DIAGNOSIS — Z8249 Family history of ischemic heart disease and other diseases of the circulatory system: Secondary | ICD-10-CM | POA: Diagnosis not present

## 2016-07-18 DIAGNOSIS — I252 Old myocardial infarction: Secondary | ICD-10-CM | POA: Diagnosis not present

## 2016-07-18 DIAGNOSIS — N185 Chronic kidney disease, stage 5: Secondary | ICD-10-CM | POA: Diagnosis not present

## 2016-07-18 DIAGNOSIS — N186 End stage renal disease: Secondary | ICD-10-CM | POA: Insufficient documentation

## 2016-07-18 DIAGNOSIS — Z794 Long term (current) use of insulin: Secondary | ICD-10-CM | POA: Insufficient documentation

## 2016-07-18 DIAGNOSIS — Y831 Surgical operation with implant of artificial internal device as the cause of abnormal reaction of the patient, or of later complication, without mention of misadventure at the time of the procedure: Secondary | ICD-10-CM | POA: Diagnosis not present

## 2016-07-18 DIAGNOSIS — Z992 Dependence on renal dialysis: Secondary | ICD-10-CM | POA: Insufficient documentation

## 2016-07-18 DIAGNOSIS — Z8673 Personal history of transient ischemic attack (TIA), and cerebral infarction without residual deficits: Secondary | ICD-10-CM | POA: Insufficient documentation

## 2016-07-18 DIAGNOSIS — T82856A Stenosis of peripheral vascular stent, initial encounter: Secondary | ICD-10-CM | POA: Diagnosis not present

## 2016-07-18 HISTORY — PX: UPPER EXTREMITY VENOGRAPHY: CATH118272

## 2016-07-18 LAB — POCT I-STAT, CHEM 8
BUN: 47 mg/dL — AB (ref 6–20)
CREATININE: 7.3 mg/dL — AB (ref 0.61–1.24)
Calcium, Ion: 1.07 mmol/L — ABNORMAL LOW (ref 1.15–1.40)
Chloride: 100 mmol/L — ABNORMAL LOW (ref 101–111)
Glucose, Bld: 275 mg/dL — ABNORMAL HIGH (ref 65–99)
HEMATOCRIT: 35 % — AB (ref 39.0–52.0)
Hemoglobin: 11.9 g/dL — ABNORMAL LOW (ref 13.0–17.0)
Potassium: 3.8 mmol/L (ref 3.5–5.1)
SODIUM: 135 mmol/L (ref 135–145)
TCO2: 27 mmol/L (ref 0–100)

## 2016-07-18 SURGERY — UPPER EXTREMITY VENOGRAPHY
Anesthesia: LOCAL | Laterality: Bilateral

## 2016-07-18 MED ORDER — IODIXANOL 320 MG/ML IV SOLN
INTRAVENOUS | Status: DC | PRN
  Administered 2016-07-18: 40 mL via INTRAVENOUS

## 2016-07-18 MED ORDER — ACETAMINOPHEN 325 MG PO TABS: 650.0000 mg | ORAL_TABLET | ORAL | Status: DC | PRN

## 2016-07-18 MED ORDER — SODIUM CHLORIDE 0.9% FLUSH: 3.0000 mL | INTRAVENOUS | Status: DC | PRN

## 2016-07-18 MED ORDER — SODIUM CHLORIDE 0.9% FLUSH: 3.0000 mL | Freq: Two times a day (BID) | INTRAVENOUS | Status: DC

## 2016-07-18 MED ORDER — HEPARIN (PORCINE) IN NACL 2-0.9 UNIT/ML-% IJ SOLN
INTRAMUSCULAR | Status: AC | PRN
  Administered 2016-07-18: 500 mL

## 2016-07-18 MED ORDER — HEPARIN (PORCINE) IN NACL 2-0.9 UNIT/ML-% IJ SOLN
INTRAMUSCULAR | Status: AC
  Filled 2016-07-18: qty 500

## 2016-07-18 MED ORDER — SODIUM CHLORIDE 0.9 % IV SOLN: 250.0000 mL | INTRAVENOUS | Status: DC | PRN

## 2016-07-18 SURGICAL SUPPLY — 2 items
STOPCOCK MORSE 400PSI 3WAY (MISCELLANEOUS) ×4 IMPLANT
TUBING CIL FLEX 10 FLL-RA (TUBING) ×4 IMPLANT

## 2016-07-18 NOTE — Discharge Instructions (Signed)
Venogram, Care After °This sheet gives you information about how to care for yourself after your procedure. Your health care provider may also give you more specific instructions. If you have problems or questions, contact your health care provider. °What can I expect after the procedure? °After the procedure, it is common to have: °· Bruising or mild discomfort in the area where the IV was inserted (insertion site). ° °Follow these instructions at home: °Eating and drinking °· Follow instructions from your health care provider about eating or drinking restrictions. °· Drink a lot of fluids for the first several days after the procedure, as directed by your health care provider. This helps to wash (flush) the contrast out of your body. Examples of healthy fluids include water or low-calorie drinks. °General instructions °· Check your IV insertion area every day for signs of infection. Check for: °? Redness, swelling, or pain. °? Fluid or blood. °? Warmth. °? Pus or a bad smell. °· Take over-the-counter and prescription medicines only as told by your health care provider. °· Rest and return to your normal activities as told by your health care provider. Ask your health care provider what activities are safe for you. °· Do not drive for 24 hours if you were given a medicine to help you relax (sedative), or until your health care provider approves. °· Keep all follow-up visits as told by your health care provider. This is important. °Contact a health care provider if: °· Your skin becomes itchy or you develop a rash or hives. °· You have a fever that does not get better with medicine. °· You feel nauseous. °· You vomit. °· You have redness, swelling, or pain around the insertion site. °· You have fluid or blood coming from the insertion site. °· Your insertion area feels warm to the touch. °· You have pus or a bad smell coming from the insertion site. °Get help right away if: °· You have difficulty breathing or  shortness of breath. °· You develop chest pain. °· You faint. °· You feel very dizzy. °These symptoms may represent a serious problem that is an emergency. Do not wait to see if the symptoms will go away. Get medical help right away. Call your local emergency services (911 in the [place] Do not drive yourself to the hospital. °Summary °· After your procedure, it is common to have bruising or mild discomfort in the area where the IV was inserted. °· You should check your IV insertion area every day for signs of infection. °· Take over-the-counter and prescription medicines only as told by your health care provider. °· You should drink a lot of fluids for the first several days after the procedure to help flush the contrast from your body. °This information is not intended to replace advice given to you by your health care provider. Make sure you discuss any questions you have with your health care provider. °Document Released: 12/16/2012 Document Revised: 01/20/2016 Document Reviewed: 01/20/2016 °Elsevier Interactive Patient Education © 2017 [employer] ° °

## 2016-07-18 NOTE — Op Note (Signed)
    OPERATIVE NOTE   PROCEDURE: 1.  bilateral arm and central venogram   PRE-OPERATIVE DIAGNOSIS: end stage renal disease  POST-OPERATIVE DIAGNOSIS: same as above   SURGEON: [name], MD  ANESTHESIA: local  ESTIMATED BLOOD LOSS: 5 cc  FINDING(S): 1. Compromised right innominate vein as evident by neck and chest wall collateral vein hypertrophy 2. Occluded left arm stent 3. Venous drainage via 5-6 mm left axillary vein 4. Paired brachial veins too small for use as a fistula 5. Patent left central venous system  SPECIMEN(S):  None  CONTRAST: 40 cc  INDICATIONS: Charles Vasquez is a 81 y.o. male who presents with end stage renal disease.  The patient is scheduled for bilateral arm and central venogram to help determine the availability of proximal veins for permanent access placement.  The patient is aware the risks include but are not limited to: bleeding, infection, thrombosis of the cannulated access, and possible anaphylactic reaction to the contrast.  The patient is aware of the risks of the procedure and elects to proceed forward.   DESCRIPTION: After full informed written consent was obtained, the patient was brought back to the angiography suite and placed supine upon the angiography table.  The patient was connected to monitoring equipment.  The right forearm IV was connected to IV extension tubing.  Hand injections were completed to image the arm veins and central venous structures, the findings of which are listed above.  I did not fully image the right arm, as the appearance of chest and neck collaterals suggest that the central venous system, especially the right innominate vein is compromised.  The left forearm IV was connected to IV extension tubing.  Hand injections were completed to image the arm veins and central venous structures, the findings of which are listed above.    Based on the images, I would consider a left upper arm arteriovenous graft vs hybrid  arteriovenous graft.   COMPLICATIONS: none  CONDITION: stable   [name], MD, [facility] [place] Office: [phone] Pager: [phone]  07/18/2016 9:09 AM

## 2016-07-18 NOTE — Progress Notes (Signed)
IVs inserted in bilateral upper extremities as ordered by [name].

## 2016-07-18 NOTE — Interval H&P Note (Signed)
History and Physical Interval Note:  07/18/2016 7:03 AM  Charles Vasquez  has presented today for surgery, with the diagnosis of ESRD  The various methods of treatment have been discussed with the patient and family. After consideration of risks, benefits and other options for treatment, the patient has consented to  Procedure(s): Bilateral Upper Extremity Venography (Bilateral) as a surgical intervention .  The patient's history has been reviewed, patient examined, no change in status, stable for surgery.  I have reviewed the patient's chart and labs.  Questions were answered to the patient's satisfaction.     [name]

## 2016-07-18 NOTE — H&P (View-Only) (Signed)
[facility] [place]  Patient name: Charles Vasquez MRN: [id number] DOB: 02/15/1936 Sex: male  REASON FOR CONSULT: Discuss options for hemodialysis access  HPI: AISON Vasquez is a 81 y.o. male, who is seen today for discussion of access for hemodialysis. He had a right upper arm graft placed by [name] in 2014. He is a good use of this since that time. He recently had occlusion of this and now is undergoing dialysis via a right IJ catheter. He seems is today to discuss further access options. He does have a left upper arm graft that the patient reports was placed in [place] years ago. Apparently never had any function out of this graft. He has no history of lower extremity peripheral vascular occlusive disease. Did have some wound problems and toes of his left foot with some amputations that healed in the past.  Past Medical History:  Diagnosis Date  . Chronic kidney disease   . Diabetes mellitus without complication (HCC)    lantus daily and humalog sliding scale  . Dizziness   . History of blood transfusion   . Hypertension    takes Amlodipine every other day  . Myocardial infarction (HCC)   . Pneumonia    last yr  . Stroke [facility])     Family History  Problem Relation Age of Onset  . Diabetes Mother   . Hypertension Mother   . Diabetes Father   . Hypertension Father     SOCIAL HISTORY: Social History   Social History  . Marital status: Widowed    Spouse name: N/A  . Number of children: N/A  . Years of education: N/A   Occupational History  . Not on file.   Social History Main Topics  . Smoking status: Never Smoker  . Smokeless tobacco: Never Used  . Alcohol use No  . Drug use: No  . Sexual activity: Yes   Other Topics Concern  . Not on file   Social History Narrative  . No narrative on file    Allergies  Allergen Reactions  . Lisinopril Other (See Comments)    REACTION: hyperkalemia/renal  deterioration    Current Outpatient Prescriptions  Medication Sig Dispense Refill  . insulin glargine (LANTUS) 100 UNIT/ML injection Inject 5-10 Units into the skin at bedtime. Per BS    . PRESCRIPTION MEDICATION Take 1 tablet by mouth daily. BP medication     No current facility-administered medications for this visit.     REVIEW OF SYSTEMS:  [X]  denotes positive finding, [ ]  denotes negative finding Cardiac  Comments:  Chest pain or chest pressure: x   Shortness of breath upon exertion: x   Short of breath when lying flat:    Irregular heart rhythm:        Vascular    Pain in calf, thigh, or hip brought on by ambulation:    Pain in feet at night that wakes you up from your sleep:  x   Blood clot in your veins:    Leg swelling:         Pulmonary    Oxygen at home:    Productive cough:     Wheezing:         Neurologic    Sudden weakness in arms or legs:     Sudden numbness in arms or legs:     Sudden onset of difficulty speaking or slurred speech:    Temporary loss of vision in one eye:  Problems with dizziness:         Gastrointestinal    Blood in stool:     Vomited blood:         Genitourinary    Burning when urinating:     Blood in urine:        Psychiatric    Major depression:         Hematologic    Bleeding problems:    Problems with blood clotting too easily:        Skin    Rashes or ulcers:        Constitutional    Fever or chills:      PHYSICAL EXAM: Vitals:   07/09/16 1039  BP: (!) 149/84  Pulse: 77  Resp: 20  Temp: (!) 96.7 F (35.9 C)  TempSrc: Oral  SpO2: 99%  Weight: 149 lb (67.6 kg)  Height: 5\' 7"  (1.702 m)    GENERAL: The patient is a well-nourished male, in no acute distress. The vital signs are documented above. CARDIOVASCULAR: Palpable radial pulses bilaterally. Occluded bilateral upper extremity Gore-Tex graft. The patient has marked tributary veins over his right shoulder and marked distention of his external jugular  veins in his left neck. PULMONARY: There is good air exchange  ABDOMEN: Soft and non-tender  MUSCULOSKELETAL: There are no major deformities or cyanosis. NEUROLOGIC: No focal weakness or paresthesias are detected. SKIN: There are no ulcers or rashes noted. PSYCHIATRIC: The patient has a normal affect.  DATA:    Scan shows occlusion with minimal flow in the outflow vein.  MEDICAL ISSUES: Incisions disease with failed right upper arm AV graft after 4 years. Will need new access. I'm concerned regarding his venous distention on the surface veins of his right chest and left neck. May have central venous occlusion. Will need bilateral upper extremity venograms prior to proceeding with access placement. I did explain the potential for lower extremity grafts. He is quite resistant to this. He knows individual is at the dialysis center who had bad outcomes and death following leg graft placement. Explained this would be quite unusual that we would not make any further recommendations until we have bilateral upper extremity venograms which will be scheduled as an outpatient   Larina Earthly, MD [facility] Tel [phone] Pager [phone]

## 2016-07-18 NOTE — Progress Notes (Signed)
2 unsuccessful attempts for IV insertion in left arm. Cath lab ready for pt, will attempt once he arrives to cath lab per [facility] and Rolly Salter, [place]. Pt has one IV in right AC.

## 2016-07-22 ENCOUNTER — Encounter (HOSPITAL_COMMUNITY): Payer: Self-pay

## 2016-07-22 NOTE — Progress Notes (Signed)
Med list reflects Lantus 5-10 units at bedtime. Pt. Remarks that he usually only gives  5 units Lantus  At bedtime &  gives himself "more" - usually 5 units in the a.m. Pt. Is vague & speaks with a heavy accent & is not understood well. Pt. Instructed to give only 1/2 the a.m. Dose of Lantus only if his CBG is >220 tomorrow a.m. Before coming to the hosp.

## 2016-07-23 ENCOUNTER — Ambulatory Visit (HOSPITAL_COMMUNITY)
Admission: RE | Admit: 2016-07-23 | Discharge: 2016-07-23 | Disposition: A | Discharge status: Home / Self Care | Payer: Medicare Other | Source: Ambulatory Visit | Attending: Vascular Surgery | Admitting: Vascular Surgery

## 2016-07-23 ENCOUNTER — Ambulatory Visit (HOSPITAL_COMMUNITY): Payer: Medicare Other | Admitting: Certified Registered Nurse Anesthetist

## 2016-07-23 ENCOUNTER — Telehealth: Payer: Self-pay | Admitting: Vascular Surgery

## 2016-07-23 ENCOUNTER — Encounter (HOSPITAL_COMMUNITY): Payer: Self-pay | Admitting: General Practice

## 2016-07-23 ENCOUNTER — Encounter (HOSPITAL_COMMUNITY)
Admission: RE | Disposition: A | Discharge status: Home / Self Care | Payer: Self-pay | Source: Ambulatory Visit | Attending: Vascular Surgery

## 2016-07-23 DIAGNOSIS — Z992 Dependence on renal dialysis: Secondary | ICD-10-CM | POA: Insufficient documentation

## 2016-07-23 DIAGNOSIS — N186 End stage renal disease: Secondary | ICD-10-CM | POA: Insufficient documentation

## 2016-07-23 DIAGNOSIS — N185 Chronic kidney disease, stage 5: Secondary | ICD-10-CM | POA: Diagnosis not present

## 2016-07-23 DIAGNOSIS — Z8673 Personal history of transient ischemic attack (TIA), and cerebral infarction without residual deficits: Secondary | ICD-10-CM | POA: Diagnosis not present

## 2016-07-23 DIAGNOSIS — Z794 Long term (current) use of insulin: Secondary | ICD-10-CM | POA: Diagnosis not present

## 2016-07-23 DIAGNOSIS — I120 Hypertensive chronic kidney disease with stage 5 chronic kidney disease or end stage renal disease: Secondary | ICD-10-CM | POA: Diagnosis not present

## 2016-07-23 DIAGNOSIS — I252 Old myocardial infarction: Secondary | ICD-10-CM | POA: Diagnosis not present

## 2016-07-23 HISTORY — PX: AV FISTULA PLACEMENT: SHX1204

## 2016-07-23 LAB — GLUCOSE, CAPILLARY: Glucose-Capillary: 151 mg/dL — ABNORMAL HIGH (ref 65–99)

## 2016-07-23 LAB — POCT I-STAT 4, (NA,K, GLUC, HGB,HCT)
Glucose, Bld: 164 mg/dL — ABNORMAL HIGH (ref 65–99)
HCT: 35 % — ABNORMAL LOW (ref 39.0–52.0)
Hemoglobin: 11.9 g/dL — ABNORMAL LOW (ref 13.0–17.0)
POTASSIUM: 3.5 mmol/L (ref 3.5–5.1)
Sodium: 139 mmol/L (ref 135–145)

## 2016-07-23 SURGERY — INSERTION OF ARTERIOVENOUS (AV) GORE-TEX GRAFT ARM
Anesthesia: Monitor Anesthesia Care | Site: Arm Upper | Laterality: Left

## 2016-07-23 MED ORDER — EPHEDRINE SULFATE 50 MG/ML IJ SOLN
INTRAMUSCULAR | Status: DC | PRN
  Administered 2016-07-23 (×2): 5 mg via INTRAVENOUS
  Administered 2016-07-23: 10 mg via INTRAVENOUS
  Administered 2016-07-23: 5 mg via INTRAVENOUS

## 2016-07-23 MED ORDER — HEMOSTATIC AGENTS (NO CHARGE) OPTIME
TOPICAL | Status: DC | PRN
  Administered 2016-07-23: 1 via TOPICAL

## 2016-07-23 MED ORDER — OXYCODONE HCL 5 MG PO TABS: 5.0000 mg | ORAL_TABLET | Freq: Once | ORAL | Status: DC | PRN

## 2016-07-23 MED ORDER — PROPOFOL 1000 MG/100ML IV EMUL
INTRAVENOUS | Status: AC
  Filled 2016-07-23: qty 100

## 2016-07-23 MED ORDER — LIDOCAINE HCL 1 % IJ SOLN
INTRAMUSCULAR | Status: AC
  Filled 2016-07-23: qty 20

## 2016-07-23 MED ORDER — FENTANYL CITRATE (PF) 100 MCG/2ML IJ SOLN: 25.0000 ug | INTRAMUSCULAR | Status: DC | PRN

## 2016-07-23 MED ORDER — 0.9 % SODIUM CHLORIDE (POUR BTL) OPTIME
TOPICAL | Status: DC | PRN
  Administered 2016-07-23: 1000 mL

## 2016-07-23 MED ORDER — LIDOCAINE HCL (PF) 1 % IJ SOLN
INTRAMUSCULAR | Status: DC | PRN
  Administered 2016-07-23: 20 mL

## 2016-07-23 MED ORDER — PHENYLEPHRINE HCL 10 MG/ML IJ SOLN
INTRAMUSCULAR | Status: DC | PRN
Start: 2016-07-23 — End: 2016-07-23
  Administered 2016-07-23 (×5): 80 ug via INTRAVENOUS

## 2016-07-23 MED ORDER — MIDAZOLAM HCL 2 MG/2ML IJ SOLN
INTRAMUSCULAR | Status: AC
  Filled 2016-07-23: qty 2

## 2016-07-23 MED ORDER — FENTANYL CITRATE (PF) 250 MCG/5ML IJ SOLN
INTRAMUSCULAR | Status: AC
  Filled 2016-07-23: qty 5

## 2016-07-23 MED ORDER — PROPOFOL 10 MG/ML IV BOLUS
INTRAVENOUS | Status: AC
  Filled 2016-07-23: qty 40

## 2016-07-23 MED ORDER — LIDOCAINE 2% (20 MG/ML) 5 ML SYRINGE
INTRAMUSCULAR | Status: DC | PRN
  Administered 2016-07-23: 25 mg via INTRAVENOUS

## 2016-07-23 MED ORDER — SODIUM CHLORIDE 0.9 % IV SOLN
INTRAVENOUS | Status: DC
  Administered 2016-07-23 (×2): via INTRAVENOUS

## 2016-07-23 MED ORDER — PROPOFOL 500 MG/50ML IV EMUL
INTRAVENOUS | Status: DC | PRN
  Administered 2016-07-23: 100 ug/kg/min via INTRAVENOUS

## 2016-07-23 MED ORDER — SODIUM CHLORIDE 0.9 % IV SOLN
INTRAVENOUS | Status: DC | PRN
  Administered 2016-07-23: 08:00:00

## 2016-07-23 MED ORDER — OXYCODONE-ACETAMINOPHEN 5-325 MG PO TABS: 1.0000 | ORAL_TABLET | Freq: Four times a day (QID) | ORAL | 0 refills | Status: DC | PRN

## 2016-07-23 MED ORDER — DEXTROSE 5 % IV SOLN
1.5000 g | INTRAVENOUS | Status: AC
  Administered 2016-07-23: 1.5 g via INTRAVENOUS
  Filled 2016-07-23: qty 1.5

## 2016-07-23 MED ORDER — FENTANYL CITRATE (PF) 100 MCG/2ML IJ SOLN
INTRAMUSCULAR | Status: DC | PRN
  Administered 2016-07-23 (×2): 25 ug via INTRAVENOUS

## 2016-07-23 MED ORDER — OXYCODONE HCL 5 MG/5ML PO SOLN: 5.0000 mg | Freq: Once | ORAL | Status: DC | PRN

## 2016-07-23 MED ORDER — HEPARIN SODIUM (PORCINE) 1000 UNIT/ML IJ SOLN
INTRAMUSCULAR | Status: AC
  Filled 2016-07-23: qty 1

## 2016-07-23 MED ORDER — HEPARIN SODIUM (PORCINE) 1000 UNIT/ML IJ SOLN
INTRAMUSCULAR | Status: DC | PRN
  Administered 2016-07-23: 3000 [IU] via INTRAVENOUS

## 2016-07-23 SURGICAL SUPPLY — 38 items
ARMBAND PINK RESTRICT EXTREMIT (MISCELLANEOUS) ×6 IMPLANT
CANISTER SUCT 3000ML PPV (MISCELLANEOUS) ×3 IMPLANT
CLIP TI MEDIUM 6 (CLIP) ×3 IMPLANT
CLIP TI WIDE RED SMALL 6 (CLIP) ×3 IMPLANT
COVER PROBE W GEL 5X96 (DRAPES) ×3 IMPLANT
DECANTER SPIKE VIAL GLASS SM (MISCELLANEOUS) ×3 IMPLANT
DERMABOND ADVANCED (GAUZE/BANDAGES/DRESSINGS) ×2
DERMABOND ADVANCED .7 DNX12 (GAUZE/BANDAGES/DRESSINGS) ×1 IMPLANT
ELECT REM PT RETURN 9FT ADLT (ELECTROSURGICAL) ×3
ELECTRODE REM PT RTRN 9FT ADLT (ELECTROSURGICAL) ×1 IMPLANT
GLOVE BIO SURGEON STRL SZ 6.5 (GLOVE) ×8 IMPLANT
GLOVE BIO SURGEON STRL SZ7 (GLOVE) ×3 IMPLANT
GLOVE BIO SURGEONS STRL SZ 6.5 (GLOVE) ×4
GLOVE BIOGEL PI IND STRL 6.5 (GLOVE) ×1 IMPLANT
GLOVE BIOGEL PI IND STRL 7.0 (GLOVE) ×3 IMPLANT
GLOVE BIOGEL PI IND STRL 7.5 (GLOVE) ×1 IMPLANT
GLOVE BIOGEL PI INDICATOR 6.5 (GLOVE) ×2
GLOVE BIOGEL PI INDICATOR 7.0 (GLOVE) ×6
GLOVE BIOGEL PI INDICATOR 7.5 (GLOVE) ×2
GOWN STRL REUS W/ TWL LRG LVL3 (GOWN DISPOSABLE) ×4 IMPLANT
GOWN STRL REUS W/TWL LRG LVL3 (GOWN DISPOSABLE) ×8
GRAFT GORETEX STRT 4-7X45 (Vascular Products) ×3 IMPLANT
HEMOSTAT SPONGE AVITENE ULTRA (HEMOSTASIS) ×3 IMPLANT
KIT BASIN OR (CUSTOM PROCEDURE TRAY) ×3 IMPLANT
KIT ROOM TURNOVER OR (KITS) ×3 IMPLANT
NS IRRIG 1000ML POUR BTL (IV SOLUTION) ×3 IMPLANT
PACK CV ACCESS (CUSTOM PROCEDURE TRAY) ×3 IMPLANT
PAD ARMBOARD 7.5X6 YLW CONV (MISCELLANEOUS) ×6 IMPLANT
SUT GORETEX 6.0 TT13 (SUTURE) IMPLANT
SUT MNCRL AB 4-0 PS2 18 (SUTURE) ×6 IMPLANT
SUT PROLENE 5 0 C 1 24 (SUTURE) ×3 IMPLANT
SUT PROLENE 6 0 BV (SUTURE) ×9 IMPLANT
SUT PROLENE 7 0 BV 1 (SUTURE) ×3 IMPLANT
SUT SILK 2 0 FS (SUTURE) IMPLANT
SUT VIC AB 3-0 SH 27 (SUTURE) ×6
SUT VIC AB 3-0 SH 27X BRD (SUTURE) ×3 IMPLANT
UNDERPAD 30X30 (UNDERPADS AND DIAPERS) ×3 IMPLANT
WATER STERILE IRR 1000ML POUR (IV SOLUTION) ×3 IMPLANT

## 2016-07-23 NOTE — Telephone Encounter (Signed)
-----   Message from Sharee Pimple, RN sent at 07/23/2016 11:06 AM EDT ----- Regarding: 6-8 weeks   ----- Message ----- From: [name], MD Sent: 07/23/2016  10:07 AM To: [facility] [address]  Charles Vasquez [id number] 12/25/1935  PROCEDURE:  left upper arm arteriovenous graft  Asst: Doreatha Massed, PAC   Follow-up: 6-8 weeks

## 2016-07-23 NOTE — Op Note (Signed)
OPERATIVE NOTE   PROCEDURE:  left upper arm arteriovenous graft  PRE-OPERATIVE DIAGNOSIS: end stage renal disease   POST-OPERATIVE DIAGNOSIS: end stage renal disease   SURGEON: Adele Barthel, MD  ASSISTANT(S): Leontine Locket, PAC   ANESTHESIA: local and MAC  ESTIMATED BLOOD LOSS: 30 cc  FINDING(S): 1. Largest vein: brachial vein deep to brachial artery 2. Palpable thrill with pulsatile character 3. Dopplerable radial signal  SPECIMEN(S):  none  INDICATIONS:   Charles Vasquez is a 81 y.o. male who presents with end stage renal disease and failed accesses in both arms.  Recent venogram demonstrated compromised right central venous structures and possible high brachial vein target on left side.  I recommended: left upper arm arteriovenous graft vs hybrid arteriovenous graft.  Risk, benefits, and alternatives to access surgery were discussed.  The patient is aware the risks include but are not limited to: bleeding, infection, steal syndrome, nerve damage, ischemic monomelic neuropathy, failure to mature, and need for additional procedures.  The patient is aware of the risks and elects to proceed forward.   DESCRIPTION: After full informed written consent was obtained from the patient, the patient was brought back to the operating room and placed supine upon the operating table.  The patient was given IV antibiotics prior to proceeding.  After obtaining adequate sedation, the patient was prepped and draped in standard fashion for a left arm access procedure.    I turned my attention first to the antecubitum.  Under ultrasound guidance, I identified the location of the brachial artery and marked it on the skin.  I then examined the bicipital groove and identified the high brachial vein and marked it on the skin.  I injected 1% lidocaine without epinephrine at this location and at the high bicipital groove to obtain some anesthesia.  In total, I used 20 mL to obtain anesthesia at the  axillary incision and also this high bicipital incision and also for the tunnel route.    I made an incision over the brachial artery, and dissected down through the subcutaneous tissue to the fascia carefully and was able to dissect out the brachial artery.  The artery was about 4 mm externally.  It was controlled proximally and distally with vessel loops and then I turned my attention to the high bicipital groove.  I made an incision at the previously anesthetized site, dissected down through the subcutaneous tissue and fascia until I reached the high brachial veins.  In this process I found the basilic vein with a stent in it.  The most superficial brachial vein appeared to be too small for access use.  After dissecting deep to the brachial artery with the median nerve on top, I found a deep brachial vein that was externally 5-6 mm in diameter.  I then dissected out this vein proximally and distal.    I took a metal Gore tunneler and dissected from the antecubital incision up to the high bicipital incision.  Then I delivered the 4 x 7-mm stretch Gore-Tex graft, through this metal tunneler and then pulled out the metal tunneler leaving the graft in place.  The 4-mm end was left on the antecubital side and the 7 mm toward the axillary side.  I then gave the patient 3000 units of heparin to gain some anticoagulation.  After waiting 3 minutes, I placed the brachial artery under tension proximally and distally with vessel loops, made an arteriotomy and extended it with a Potts scissor.  I sewed the  4-mm end of the graft to this arteriotomy with a running stitch of 7-0 Prolene.  At this point, then I completed the anastomosis in the usual fashion.   I released the vessel loops on the inflow and allowed the artery to decompress through the graft. There was good pulsatile bleeding through this graft.  I clamped the graft near its arterial anastomosis and sucked out all the blood in the graft and loaded the graft  with heparinized saline.  At this point, I pulled the graft to appropriate length and reset my exposure of the high brachial vein.  I tied off the high brachial vein distally with a 2-0 silk and then transected it.  There was good venous backbleeding from the vein.  Then, I injected some heparinized saline into this vein and then clamped it.  I then spatulated the vein to facilitate an end-to- end anastomosis.  I also spatulated the graft to facilitate an end-to-end anastomosis.  In the process of spatulating, I cut the graft to appropriate length for this anastomosis.  This graft was sewn to the vein in an end-to-end configuration with a 6-0 Prolene.  Prior to completing this anastomosis, I allowed the vein to back bleed and then I also allowed the artery to bleed in an antegrade fashion.  There was no clot from either end.  I completed this anastomosis in the usual fashion, and then irrigated out the high bicipital exposure and then placed Avitene.  I then turned my attention back to the antecubitum.  The distal radial signal was dopplerable.  Using a continuous Doppler, the brachial artery proximally and distally had multiphasic waveforms.  The venous outflow had a flow signature consistent with widely patent arteriovenous graft.    At this point, I washed out the antecubital incision.  I tightened up the suture line by pulling up with a nerve hook.  I repaired the laxity with 6-0 prolene sutures in the usual fashion.  There was no more active bleeding.  The subcutaneous tissue was reapproximated with a running stitch of 3-0 Vicryl.  The skin was then reapproximated with a running subcuticular 4-0 Monocryl.  The skin was then cleaned, dried, and Dermabond used to reinforce the skin closure.    I then turned our attention to the high bicipital exposure.  I removed all the Avitene and washed out the wound.  There was some bleeding from the toe of the graft, so I repaired this with a 6-0 Prolene.  There was no  more active bleeding.  The subcutaneous tissue was repaired with running stitch of 3-0 Vicryl.  The skin was then reapproximated with running subcuticular 4-0 Monocryl.  The skin was then cleaned, dried, and then the skin closure was reinforced with Dermabond.    At this point, the thrill in the graft was improved but there was a somewhat pulsatile character.  This is partially due to compression of the graft by the bicep muscle.  I did not see any simple solution to this disadvantageous anatomy.   COMPLICATIONS: none  CONDITION: stable   Adele Barthel, MD, [facility] [place] Office: [phone] Pager: [phone]  07/23/2016, 9:55 AM

## 2016-07-23 NOTE — Anesthesia Postprocedure Evaluation (Addendum)
Anesthesia Post Note  Patient: [name]  Procedure(s) Performed: Procedure(s) (LRB): INSERTION OF ARTERIOVENOUS (AV) GORE-TEX GRAFT LEFT UPPER ARM (Left)  Patient location during evaluation: PACU Anesthesia Type: MAC Level of consciousness: awake and alert Pain management: pain level controlled Vital Signs Assessment: post-procedure vital signs reviewed and stable Respiratory status: spontaneous breathing, nonlabored ventilation, respiratory function stable and patient connected to nasal cannula oxygen Cardiovascular status: stable and blood pressure returned to baseline Anesthetic complications: no       Last Vitals:  Vitals:   07/23/16 1125 07/23/16 1146  BP: 139/61 (!) 142/86  Pulse: 75 79  Resp: 17 16  Temp:      Last Pain:  Vitals:   07/23/16 1013  TempSrc:   PainSc: 0-No pain                 Cobi Delph

## 2016-07-23 NOTE — Anesthesia Preprocedure Evaluation (Signed)
Anesthesia Evaluation  Patient identified by MRN, date of birth, ID band Patient awake    Airway Mallampati: I  TM Distance: >3 FB Neck ROM: Full    Dental  (+) Edentulous Upper, Edentulous Lower   Pulmonary    breath sounds clear to auscultation + decreased breath sounds      Cardiovascular hypertension, + Past MI   Rhythm:Regular Rate:Normal     Neuro/Psych CVA    GI/Hepatic   Endo/Other  diabetes, Insulin Dependent  Renal/GU DialysisRenal disease     Musculoskeletal  (+) Arthritis ,   Abdominal Normal abdominal exam  (+)   Peds  Hematology   Anesthesia Other Findings   Reproductive/Obstetrics                             Anesthesia Physical Anesthesia Plan  ASA: III  Anesthesia Plan: MAC   Post-op Pain Management:    Induction: Intravenous  Airway Management Planned: Natural Airway and Simple Face Mask  Additional Equipment:   Intra-op Plan:   Post-operative Plan:   Informed Consent: I have reviewed the patients History and Physical, chart, labs and discussed the procedure including the risks, benefits and alternatives for the proposed anesthesia with the patient or authorized representative who has indicated his/her understanding and acceptance.     Plan Discussed with: Anesthesiologist and Surgeon  Anesthesia Plan Comments:         Anesthesia Quick Evaluation

## 2016-07-23 NOTE — Discharge Instructions (Signed)
° ° °  07/23/2016 Charles Vasquez [id number] 1935/07/07  Surgeon(s): [name], MD  Procedure(s): INSERTION OF ARTERIOVENOUS (AV) GORE-TEX GRAFT LEFT UPPER ARM  x Do not stick graft for 4 weeks

## 2016-07-23 NOTE — H&P (View-Only) (Signed)
  [facility] [place]  Patient name: Charles Vasquez MRN: [id number] DOB: 02/08/1936 Sex: male  REASON FOR CONSULT: Discuss options for hemodialysis access  HPI: Charles Vasquez is a 81 y.o. male, who is seen today for discussion of access for hemodialysis. He had a right upper arm graft placed by Dr. Chen in 2014. He is a good use of this since that time. He recently had occlusion of this and now is undergoing dialysis via a right IJ catheter. He seems is today to discuss further access options. He does have a left upper arm graft that the patient reports was placed in [place] years ago. Apparently never had any function out of this graft. He has no history of lower extremity peripheral vascular occlusive disease. Did have some wound problems and toes of his left foot with some amputations that healed in the past.  Past Medical History:  Diagnosis Date  . Chronic kidney disease   . Diabetes mellitus without complication (HCC)    lantus daily and humalog sliding scale  . Dizziness   . History of blood transfusion   . Hypertension    takes Amlodipine every other day  . Myocardial infarction (HCC)   . Pneumonia    last yr  . Stroke (HCC)     Family History  Problem Relation Age of Onset  . Diabetes Mother   . Hypertension Mother   . Diabetes Father   . Hypertension Father     SOCIAL HISTORY: Social History   Social History  . Marital status: Widowed    Spouse name: N/A  . Number of children: N/A  . Years of education: N/A   Occupational History  . Not on file.   Social History Main Topics  . Smoking status: Never Smoker  . Smokeless tobacco: Never Used  . Alcohol use No  . Drug use: No  . Sexual activity: Yes   Other Topics Concern  . Not on file   Social History Narrative  . No narrative on file    Allergies  Allergen Reactions  . Lisinopril Other (See Comments)    REACTION: hyperkalemia/renal  deterioration    Current Outpatient Prescriptions  Medication Sig Dispense Refill  . insulin glargine (LANTUS) 100 UNIT/ML injection Inject 5-10 Units into the skin at bedtime. Per BS    . PRESCRIPTION MEDICATION Take 1 tablet by mouth daily. BP medication     No current facility-administered medications for this visit.     REVIEW OF SYSTEMS:  [X] denotes positive finding, [ ] denotes negative finding Cardiac  Comments:  Chest pain or chest pressure: x   Shortness of breath upon exertion: x   Short of breath when lying flat:    Irregular heart rhythm:        Vascular    Pain in calf, thigh, or hip brought on by ambulation:    Pain in feet at night that wakes you up from your sleep:  x   Blood clot in your veins:    Leg swelling:         Pulmonary    Oxygen at home:    Productive cough:     Wheezing:         Neurologic    Sudden weakness in arms or legs:     Sudden numbness in arms or legs:     Sudden onset of difficulty speaking or slurred speech:    Temporary loss of vision in one eye:       Problems with dizziness:         Gastrointestinal    Blood in stool:     Vomited blood:         Genitourinary    Burning when urinating:     Blood in urine:        Psychiatric    Major depression:         Hematologic    Bleeding problems:    Problems with blood clotting too easily:        Skin    Rashes or ulcers:        Constitutional    Fever or chills:      PHYSICAL EXAM: Vitals:   07/09/16 1039  BP: (!) 149/84  Pulse: 77  Resp: 20  Temp: (!) 96.7 F (35.9 C)  TempSrc: Oral  SpO2: 99%  Weight: 149 lb (67.6 kg)  Height: 5' 7" (1.702 m)    GENERAL: The patient is a well-nourished male, in no acute distress. The vital signs are documented above. CARDIOVASCULAR: Palpable radial pulses bilaterally. Occluded bilateral upper extremity Gore-Tex graft. The patient has marked tributary veins over his right shoulder and marked distention of his external jugular  veins in his left neck. PULMONARY: There is good air exchange  ABDOMEN: Soft and non-tender  MUSCULOSKELETAL: There are no major deformities or cyanosis. NEUROLOGIC: No focal weakness or paresthesias are detected. SKIN: There are no ulcers or rashes noted. PSYCHIATRIC: The patient has a normal affect.  DATA:    Scan shows occlusion with minimal flow in the outflow vein.  MEDICAL ISSUES: Incisions disease with failed right upper arm AV graft after 81 years. Will need new access. I'm concerned regarding his venous distention on the surface veins of his right chest and left neck. May have central venous occlusion. Will need bilateral upper extremity venograms prior to proceeding with access placement. I did explain the potential for lower extremity grafts. He is quite resistant to this. He knows individual is at the dialysis center who had bad outcomes and death following leg graft placement. Explained this would be quite unusual that we would not make any further recommendations until we have bilateral upper extremity venograms which will be scheduled as an outpatient   Ebert Forrester F. Phinneas Shakoor, MD FACS [facility] [place] Office Tel [phone] Pager [phone]    

## 2016-07-23 NOTE — Interval H&P Note (Signed)
History and Physical Interval Note:  07/23/2016 7:19 AM  Charles Vasquez  has presented today for surgery, with the diagnosis of Chronic kidney disease on hemodialysis  The various methods of treatment have been discussed with the patient and family. After consideration of risks, benefits and other options for treatment, the patient has consented to  Procedure(s): INSERTION OF ARTERIOVENOUS (AV) GORE-TEX GRAFT LEFT UPPER ARM VS HYBRID (Left) as a surgical intervention .  The patient's history has been reviewed, patient examined, no change in status, stable for surgery.  I have reviewed the patient's chart and labs.  Questions were answered to the patient's satisfaction.     [name]

## 2016-07-23 NOTE — Transfer of Care (Signed)
Immediate Anesthesia Transfer of Care Note  Patient: Charles Vasquez  Procedure(s) Performed: Procedure(s): INSERTION OF ARTERIOVENOUS (AV) GORE-TEX GRAFT LEFT UPPER ARM (Left)  Patient Location: PACU  Anesthesia Type:MAC  Level of Consciousness: patient cooperative and responds to stimulation  Airway & Oxygen Therapy: Patient Spontanous Breathing and Patient connected to face mask oxygen  Post-op Assessment: Report given to RN and Post -op Vital signs reviewed and stable  Post vital signs: Reviewed and stable  Last Vitals:  Vitals:   07/23/16 0740  BP: (!) 154/87  Pulse: 67  Resp: 18  Temp: 36.7 C    Last Pain:  Vitals:   07/23/16 0740  TempSrc: Oral      Patients Stated Pain Goal: 5 (81/44/81 8563)  Complications: No apparent anesthesia complications

## 2016-07-23 NOTE — Telephone Encounter (Signed)
Sched appt 09/06/16 at 3:15. Lm on cell# for pt to confirm appt.

## 2016-07-24 ENCOUNTER — Encounter (HOSPITAL_COMMUNITY): Payer: Self-pay | Admitting: Vascular Surgery

## 2016-08-04 ENCOUNTER — Inpatient Hospital Stay (HOSPITAL_COMMUNITY)
Admission: AD | Admit: 2016-08-04 | Discharge: 2016-08-11 | DRG: 252 | Disposition: A | Discharge status: Home Health | Payer: Medicare Other | Source: Other Acute Inpatient Hospital | Attending: Internal Medicine | Admitting: Internal Medicine

## 2016-08-04 ENCOUNTER — Encounter (HOSPITAL_COMMUNITY): Payer: Self-pay | Admitting: Acute Care

## 2016-08-04 ENCOUNTER — Inpatient Hospital Stay (HOSPITAL_COMMUNITY): Payer: Medicare Other | Admitting: Anesthesiology

## 2016-08-04 ENCOUNTER — Telehealth: Payer: Self-pay | Admitting: Vascular Surgery

## 2016-08-04 ENCOUNTER — Encounter (HOSPITAL_COMMUNITY)
Admission: AD | Disposition: A | Discharge status: Home Health | Payer: Self-pay | Source: Other Acute Inpatient Hospital | Attending: Internal Medicine

## 2016-08-04 DIAGNOSIS — D696 Thrombocytopenia, unspecified: Secondary | ICD-10-CM | POA: Diagnosis present

## 2016-08-04 DIAGNOSIS — I10 Essential (primary) hypertension: Secondary | ICD-10-CM | POA: Diagnosis not present

## 2016-08-04 DIAGNOSIS — K219 Gastro-esophageal reflux disease without esophagitis: Secondary | ICD-10-CM | POA: Diagnosis present

## 2016-08-04 DIAGNOSIS — Z8673 Personal history of transient ischemic attack (TIA), and cerebral infarction without residual deficits: Secondary | ICD-10-CM

## 2016-08-04 DIAGNOSIS — R220 Localized swelling, mass and lump, head: Secondary | ICD-10-CM | POA: Diagnosis not present

## 2016-08-04 DIAGNOSIS — N182 Chronic kidney disease, stage 2 (mild): Secondary | ICD-10-CM | POA: Diagnosis not present

## 2016-08-04 DIAGNOSIS — I871 Compression of vein: Secondary | ICD-10-CM | POA: Diagnosis not present

## 2016-08-04 DIAGNOSIS — M2548 Effusion, other site: Secondary | ICD-10-CM | POA: Diagnosis present

## 2016-08-04 DIAGNOSIS — Z8249 Family history of ischemic heart disease and other diseases of the circulatory system: Secondary | ICD-10-CM

## 2016-08-04 DIAGNOSIS — I120 Hypertensive chronic kidney disease with stage 5 chronic kidney disease or end stage renal disease: Secondary | ICD-10-CM | POA: Diagnosis present

## 2016-08-04 DIAGNOSIS — I878 Other specified disorders of veins: Secondary | ICD-10-CM | POA: Diagnosis present

## 2016-08-04 DIAGNOSIS — R601 Generalized edema: Secondary | ICD-10-CM | POA: Insufficient documentation

## 2016-08-04 DIAGNOSIS — M898X9 Other specified disorders of bone, unspecified site: Secondary | ICD-10-CM | POA: Diagnosis present

## 2016-08-04 DIAGNOSIS — I87309 Chronic venous hypertension (idiopathic) without complications of unspecified lower extremity: Secondary | ICD-10-CM | POA: Diagnosis not present

## 2016-08-04 DIAGNOSIS — E875 Hyperkalemia: Secondary | ICD-10-CM | POA: Diagnosis present

## 2016-08-04 DIAGNOSIS — I953 Hypotension of hemodialysis: Secondary | ICD-10-CM | POA: Diagnosis not present

## 2016-08-04 DIAGNOSIS — R221 Localized swelling, mass and lump, neck: Secondary | ICD-10-CM | POA: Diagnosis present

## 2016-08-04 DIAGNOSIS — N186 End stage renal disease: Secondary | ICD-10-CM | POA: Diagnosis not present

## 2016-08-04 DIAGNOSIS — Z992 Dependence on renal dialysis: Secondary | ICD-10-CM | POA: Diagnosis not present

## 2016-08-04 DIAGNOSIS — E119 Type 2 diabetes mellitus without complications: Secondary | ICD-10-CM

## 2016-08-04 DIAGNOSIS — H0520 Unspecified exophthalmos: Secondary | ICD-10-CM | POA: Diagnosis present

## 2016-08-04 DIAGNOSIS — K5900 Constipation, unspecified: Secondary | ICD-10-CM | POA: Diagnosis present

## 2016-08-04 DIAGNOSIS — J9601 Acute respiratory failure with hypoxia: Secondary | ICD-10-CM | POA: Diagnosis present

## 2016-08-04 DIAGNOSIS — Z794 Long term (current) use of insulin: Secondary | ICD-10-CM

## 2016-08-04 DIAGNOSIS — Z978 Presence of other specified devices: Secondary | ICD-10-CM

## 2016-08-04 DIAGNOSIS — Z833 Family history of diabetes mellitus: Secondary | ICD-10-CM

## 2016-08-04 DIAGNOSIS — E1122 Type 2 diabetes mellitus with diabetic chronic kidney disease: Secondary | ICD-10-CM | POA: Diagnosis present

## 2016-08-04 DIAGNOSIS — D631 Anemia in chronic kidney disease: Secondary | ICD-10-CM | POA: Diagnosis present

## 2016-08-04 DIAGNOSIS — R1310 Dysphagia, unspecified: Secondary | ICD-10-CM | POA: Diagnosis present

## 2016-08-04 DIAGNOSIS — I252 Old myocardial infarction: Secondary | ICD-10-CM | POA: Diagnosis not present

## 2016-08-04 DIAGNOSIS — J9600 Acute respiratory failure, unspecified whether with hypoxia or hypercapnia: Secondary | ICD-10-CM

## 2016-08-04 HISTORY — PX: LIGATION ARTERIOVENOUS GORTEX GRAFT: SHX5947

## 2016-08-04 LAB — CBC
HEMATOCRIT: 32.5 % — AB (ref 39.0–52.0)
HEMOGLOBIN: 10.5 g/dL — AB (ref 13.0–17.0)
MCH: 30.1 pg (ref 26.0–34.0)
MCHC: 32.3 g/dL (ref 30.0–36.0)
MCV: 93.1 fL (ref 78.0–100.0)
Platelets: 114 10*3/uL — ABNORMAL LOW (ref 150–400)
RBC: 3.49 MIL/uL — AB (ref 4.22–5.81)
RDW: 15.6 % — ABNORMAL HIGH (ref 11.5–15.5)
WBC: 6.2 10*3/uL (ref 4.0–10.5)

## 2016-08-04 LAB — COMPREHENSIVE METABOLIC PANEL
ALT: 33 U/L (ref 17–63)
AST: 52 U/L — ABNORMAL HIGH (ref 15–41)
Albumin: 3.3 g/dL — ABNORMAL LOW (ref 3.5–5.0)
Alkaline Phosphatase: 114 U/L (ref 38–126)
Anion gap: 10 (ref 5–15)
BUN: 40 mg/dL — ABNORMAL HIGH (ref 6–20)
CHLORIDE: 98 mmol/L — AB (ref 101–111)
CO2: 25 mmol/L (ref 22–32)
Calcium: 8 mg/dL — ABNORMAL LOW (ref 8.9–10.3)
Creatinine, Ser: 7.5 mg/dL — ABNORMAL HIGH (ref 0.61–1.24)
GFR, EST AFRICAN AMERICAN: 7 mL/min — AB (ref >60)
GFR, EST NON AFRICAN AMERICAN: 6 mL/min — AB (ref >60)
Glucose, Bld: 339 mg/dL — ABNORMAL HIGH (ref 65–99)
POTASSIUM: 5.8 mmol/L — AB (ref 3.5–5.1)
Sodium: 133 mmol/L — ABNORMAL LOW (ref 135–145)
Total Bilirubin: 0.7 mg/dL (ref 0.3–1.2)
Total Protein: 6.6 g/dL (ref 6.5–8.1)

## 2016-08-04 LAB — GLUCOSE, CAPILLARY
GLUCOSE-CAPILLARY: 257 mg/dL — AB (ref 65–99)
Glucose-Capillary: 244 mg/dL — ABNORMAL HIGH (ref 65–99)

## 2016-08-04 LAB — BASIC METABOLIC PANEL
ANION GAP: 13 (ref 5–15)
BUN: 42 mg/dL — AB (ref 6–20)
CO2: 21 mmol/L — ABNORMAL LOW (ref 22–32)
Calcium: 8 mg/dL — ABNORMAL LOW (ref 8.9–10.3)
Chloride: 99 mmol/L — ABNORMAL LOW (ref 101–111)
Creatinine, Ser: 7.48 mg/dL — ABNORMAL HIGH (ref 0.61–1.24)
GFR calc non Af Amer: 6 mL/min — ABNORMAL LOW (ref >60)
GFR, EST AFRICAN AMERICAN: 7 mL/min — AB (ref >60)
Glucose, Bld: 301 mg/dL — ABNORMAL HIGH (ref 65–99)
POTASSIUM: 4.3 mmol/L (ref 3.5–5.1)
SODIUM: 133 mmol/L — AB (ref 135–145)

## 2016-08-04 LAB — PROTIME-INR
INR: 0.93
Prothrombin Time: 12.5 seconds (ref 11.4–15.2)

## 2016-08-04 LAB — MRSA PCR SCREENING: MRSA BY PCR: NEGATIVE

## 2016-08-04 LAB — TSH: TSH: 2.324 u[IU]/mL (ref 0.350–4.500)

## 2016-08-04 SURGERY — LIGATION ARTERIOVENOUS GORTEX GRAFT
Anesthesia: General | Site: Arm Upper | Laterality: Left

## 2016-08-04 MED ORDER — ALBUTEROL SULFATE (2.5 MG/3ML) 0.083% IN NEBU
INHALATION_SOLUTION | RESPIRATORY_TRACT | Status: AC
  Administered 2016-08-04: 18:00:00
  Filled 2016-08-04: qty 3

## 2016-08-04 MED ORDER — ACETAMINOPHEN 650 MG RE SUPP: 650.0000 mg | Freq: Four times a day (QID) | RECTAL | Status: DC | PRN

## 2016-08-04 MED ORDER — INSULIN ASPART 100 UNIT/ML ~~LOC~~ SOLN: 0.0000 [IU] | Freq: Every day | SUBCUTANEOUS | Status: DC

## 2016-08-04 MED ORDER — METHYLPREDNISOLONE SODIUM SUCC 125 MG IJ SOLR
INTRAMUSCULAR | Status: AC
  Filled 2016-08-04: qty 2

## 2016-08-04 MED ORDER — INSULIN ASPART 100 UNIT/ML ~~LOC~~ SOLN: 8.0000 [IU] | Freq: Once | SUBCUTANEOUS | Status: DC

## 2016-08-04 MED ORDER — INSULIN GLARGINE 100 UNIT/ML ~~LOC~~ SOLN
5.0000 [IU] | Freq: Every day | SUBCUTANEOUS | Status: DC
  Filled 2016-08-04: qty 0.05

## 2016-08-04 MED ORDER — 0.9 % SODIUM CHLORIDE (POUR BTL) OPTIME
TOPICAL | Status: DC | PRN
  Administered 2016-08-04: 1000 mL

## 2016-08-04 MED ORDER — SUFENTANIL CITRATE 50 MCG/ML IV SOLN
INTRAVENOUS | Status: DC | PRN
  Administered 2016-08-04: 20 ug via INTRAVENOUS

## 2016-08-04 MED ORDER — SODIUM CHLORIDE 0.9 % IJ SOLN
INTRAMUSCULAR | Status: AC
  Filled 2016-08-04: qty 10

## 2016-08-04 MED ORDER — ONDANSETRON HCL 4 MG/2ML IJ SOLN: 4.0000 mg | Freq: Four times a day (QID) | INTRAMUSCULAR | Status: DC | PRN

## 2016-08-04 MED ORDER — LIDOCAINE HCL (CARDIAC) 20 MG/ML IV SOLN
INTRAVENOUS | Status: DC | PRN
  Administered 2016-08-04: 100 mg via INTRATRACHEAL

## 2016-08-04 MED ORDER — PROPOFOL 10 MG/ML IV BOLUS
INTRAVENOUS | Status: DC | PRN
  Administered 2016-08-04: 100 mg via INTRAVENOUS

## 2016-08-04 MED ORDER — METHYLPREDNISOLONE SODIUM SUCC 125 MG IJ SOLR
60.0000 mg | Freq: Once | INTRAMUSCULAR | Status: AC
  Administered 2016-08-04: 60 mg via INTRAVENOUS

## 2016-08-04 MED ORDER — HYDRALAZINE HCL 20 MG/ML IJ SOLN
5.0000 mg | INTRAMUSCULAR | Status: DC | PRN
  Administered 2016-08-07: 5 mg via INTRAVENOUS
  Filled 2016-08-04: qty 1

## 2016-08-04 MED ORDER — ALBUTEROL SULFATE (2.5 MG/3ML) 0.083% IN NEBU
2.5000 mg | INHALATION_SOLUTION | Freq: Four times a day (QID) | RESPIRATORY_TRACT | Status: DC
  Administered 2016-08-05 – 2016-08-08 (×16): 2.5 mg via RESPIRATORY_TRACT
  Filled 2016-08-04 (×16): qty 3

## 2016-08-04 MED ORDER — PROPOFOL 500 MG/50ML IV EMUL
INTRAVENOUS | Status: DC | PRN
  Administered 2016-08-04: 60 ug/kg/min via INTRAVENOUS

## 2016-08-04 MED ORDER — SUCCINYLCHOLINE 20MG/ML (10ML) SYRINGE FOR MEDFUSION PUMP - OPTIME
INTRAMUSCULAR | Status: DC | PRN
  Administered 2016-08-04: 120 mg via INTRAVENOUS

## 2016-08-04 MED ORDER — PHENYLEPHRINE 40 MCG/ML (10ML) SYRINGE FOR IV PUSH (FOR BLOOD PRESSURE SUPPORT)
PREFILLED_SYRINGE | INTRAVENOUS | Status: AC
  Filled 2016-08-04: qty 20

## 2016-08-04 MED ORDER — DEXTROSE 5 % IV SOLN
0.0000 ug/min | INTRAVENOUS | Status: DC
  Administered 2016-08-04: 2 ug/min via INTRAVENOUS
  Filled 2016-08-04: qty 4

## 2016-08-04 MED ORDER — ACETAMINOPHEN 325 MG PO TABS
650.0000 mg | ORAL_TABLET | Freq: Four times a day (QID) | ORAL | Status: DC | PRN
  Administered 2016-08-09: 650 mg via ORAL
  Filled 2016-08-04: qty 2

## 2016-08-04 MED ORDER — INSULIN ASPART 100 UNIT/ML ~~LOC~~ SOLN
0.0000 [IU] | SUBCUTANEOUS | Status: DC
  Administered 2016-08-05: 5 [IU] via SUBCUTANEOUS
  Administered 2016-08-05: 8 [IU] via SUBCUTANEOUS
  Administered 2016-08-05: 2 [IU] via SUBCUTANEOUS
  Administered 2016-08-05 – 2016-08-07 (×5): 3 [IU] via SUBCUTANEOUS
  Administered 2016-08-08: 5 [IU] via SUBCUTANEOUS
  Administered 2016-08-08: 3 [IU] via SUBCUTANEOUS
  Administered 2016-08-08 (×3): 5 [IU] via SUBCUTANEOUS
  Administered 2016-08-09 (×2): 11 [IU] via SUBCUTANEOUS
  Administered 2016-08-09 – 2016-08-10 (×2): 2 [IU] via SUBCUTANEOUS

## 2016-08-04 MED ORDER — PHENYLEPHRINE HCL 10 MG/ML IJ SOLN
INTRAMUSCULAR | Status: DC | PRN
  Administered 2016-08-04 (×3): 80 ug via INTRAVENOUS
  Administered 2016-08-04: 160 ug via INTRAVENOUS

## 2016-08-04 MED ORDER — SUFENTANIL CITRATE 50 MCG/ML IV SOLN
INTRAVENOUS | Status: AC
  Filled 2016-08-04: qty 1

## 2016-08-04 MED ORDER — SODIUM CHLORIDE 0.9 % IV SOLN
INTRAVENOUS | Status: DC | PRN
  Administered 2016-08-04: 19:00:00 via INTRAVENOUS

## 2016-08-04 MED ORDER — SODIUM CHLORIDE 0.9 % IV SOLN: INTRAVENOUS | Status: AC

## 2016-08-04 MED ORDER — ONDANSETRON HCL 4 MG PO TABS
4.0000 mg | ORAL_TABLET | Freq: Four times a day (QID) | ORAL | Status: DC | PRN
  Administered 2016-08-11: 4 mg via ORAL
  Filled 2016-08-04: qty 1

## 2016-08-04 MED ORDER — SODIUM CHLORIDE 0.9% FLUSH
3.0000 mL | Freq: Two times a day (BID) | INTRAVENOUS | Status: DC
  Administered 2016-08-05 – 2016-08-11 (×11): 3 mL via INTRAVENOUS

## 2016-08-04 MED ORDER — ALBUMIN HUMAN 5 % IV SOLN
INTRAVENOUS | Status: DC | PRN
  Administered 2016-08-04: 20:00:00 via INTRAVENOUS

## 2016-08-04 MED ORDER — PROPOFOL 1000 MG/100ML IV EMUL
5.0000 ug/kg/min | INTRAVENOUS | Status: DC
  Administered 2016-08-04: 15 ug/kg/min via INTRAVENOUS
  Administered 2016-08-05 (×2): 25 ug/kg/min via INTRAVENOUS
  Administered 2016-08-06: 15 ug/kg/min via INTRAVENOUS
  Filled 2016-08-04 (×3): qty 100

## 2016-08-04 MED ORDER — SODIUM CHLORIDE 0.9 % IV SOLN
500.0000 mg | Freq: Once | INTRAVENOUS | Status: AC
  Administered 2016-08-04: 500 mg via INTRAVENOUS
  Filled 2016-08-04: qty 500

## 2016-08-04 MED ORDER — LIDOCAINE 2% (20 MG/ML) 5 ML SYRINGE
INTRAMUSCULAR | Status: AC
  Filled 2016-08-04: qty 5

## 2016-08-04 MED ORDER — FAMOTIDINE IN NACL 20-0.9 MG/50ML-% IV SOLN
20.0000 mg | Freq: Two times a day (BID) | INTRAVENOUS | Status: DC
  Administered 2016-08-04: 20 mg via INTRAVENOUS
  Filled 2016-08-04 (×2): qty 50

## 2016-08-04 MED ORDER — PROPOFOL 10 MG/ML IV BOLUS
INTRAVENOUS | Status: AC
Start: 2016-08-04 — End: 2016-08-04
  Filled 2016-08-04: qty 20

## 2016-08-04 MED ORDER — INSULIN ASPART 100 UNIT/ML ~~LOC~~ SOLN
0.0000 [IU] | Freq: Three times a day (TID) | SUBCUTANEOUS | Status: DC
  Administered 2016-08-04: 7 [IU] via SUBCUTANEOUS

## 2016-08-04 MED ORDER — SUCCINYLCHOLINE CHLORIDE 200 MG/10ML IV SOSY
PREFILLED_SYRINGE | INTRAVENOUS | Status: AC
  Filled 2016-08-04: qty 10

## 2016-08-04 SURGICAL SUPPLY — 25 items
CANISTER SUCT 3000ML PPV (MISCELLANEOUS) ×3 IMPLANT
CLIP LIGATING EXTRA MED SLVR (CLIP) ×3 IMPLANT
CLIP LIGATING EXTRA SM BLUE (MISCELLANEOUS) ×3 IMPLANT
DECANTER SPIKE VIAL GLASS SM (MISCELLANEOUS) ×3 IMPLANT
DERMABOND ADVANCED (GAUZE/BANDAGES/DRESSINGS) ×2
DERMABOND ADVANCED .7 DNX12 (GAUZE/BANDAGES/DRESSINGS) ×1 IMPLANT
ELECT REM PT RETURN 9FT ADLT (ELECTROSURGICAL) ×3
ELECTRODE REM PT RTRN 9FT ADLT (ELECTROSURGICAL) ×1 IMPLANT
GLOVE SS BIOGEL STRL SZ 7.5 (GLOVE) ×1 IMPLANT
GLOVE SUPERSENSE BIOGEL SZ 7.5 (GLOVE) ×2
GOWN STRL REUS W/ TWL LRG LVL3 (GOWN DISPOSABLE) ×3 IMPLANT
GOWN STRL REUS W/TWL LRG LVL3 (GOWN DISPOSABLE) ×6
KIT BASIN OR (CUSTOM PROCEDURE TRAY) ×3 IMPLANT
KIT ROOM TURNOVER OR (KITS) ×3 IMPLANT
NS IRRIG 1000ML POUR BTL (IV SOLUTION) ×3 IMPLANT
PACK CV ACCESS (CUSTOM PROCEDURE TRAY) ×3 IMPLANT
PAD ARMBOARD 7.5X6 YLW CONV (MISCELLANEOUS) ×6 IMPLANT
STAPLER VISISTAT 35W (STAPLE) IMPLANT
SUT ETHILON 3 0 PS 1 (SUTURE) IMPLANT
SUT PROLENE 6 0 CC (SUTURE) IMPLANT
SUT SILK 0 TIES 10X30 (SUTURE) ×3 IMPLANT
SUT VIC AB 3-0 SH 27 (SUTURE) ×4
SUT VIC AB 3-0 SH 27X BRD (SUTURE) ×2 IMPLANT
UNDERPAD 30X30 (UNDERPADS AND DIAPERS) ×3 IMPLANT
WATER STERILE IRR 1000ML POUR (IV SOLUTION) ×3 IMPLANT

## 2016-08-04 NOTE — Transfer of Care (Signed)
Immediate Anesthesia Transfer of Care Note  Patient: Charles Vasquez  Procedure(s) Performed: Procedure(s): LIGATION ARTERIOVENOUS GORTEX GRAFT (Left)  Patient Location: ICU  Anesthesia Type:General  Level of Consciousness: sedated and Patient remains intubated per anesthesia plan  Airway & Oxygen Therapy: Patient remains intubated per anesthesia plan and Patient placed on Ventilator (see vital sign flow sheet for setting)  Post-op Assessment: Report given to RN  Post vital signs: Reviewed and stable  Last Vitals:  Vitals:   08/04/16 1559  BP: (!) 156/75  Pulse: 78  Resp: 18  Temp: 36.6 C    Last Pain:  Vitals:   08/04/16 1559  TempSrc: Oral         Complications: No apparent anesthesia complications

## 2016-08-04 NOTE — Anesthesia Procedure Notes (Signed)
Procedure Name: Intubation Date/Time: 08/04/2016 7:12 PM Performed by: Claris Che Pre-anesthesia Checklist: Patient identified, Emergency Drugs available, Suction available, Patient being monitored and Timeout performed Patient Re-evaluated:Patient Re-evaluated prior to inductionOxygen Delivery Method: Circle system utilized Preoxygenation: Pre-oxygenation with 100% oxygen Intubation Type: IV induction, Rapid sequence and Cricoid Pressure applied Ventilation: Mask ventilation without difficulty Laryngoscope Size: Mac and 4 Grade View: Grade III Tube type: Oral Tube size: 8.0 mm Number of attempts: 1 Airway Equipment and Method: Stylet Placement Confirmation: ETT inserted through vocal cords under direct vision,  positive ETCO2 and breath sounds checked- equal and bilateral Secured at: 24 cm Tube secured with: Tape Dental Injury: Teeth and Oropharynx as per pre-operative assessment

## 2016-08-04 NOTE — H&P (Signed)
History and Physical    Charles Vasquez OEH:[id number] DOB: April 10, 1935 DOA: 08/04/2016  PCP: Toma Deiters, MD Patient coming from: home  Chief Complaint: facial swelling/sore throat  HPI: Charles Vasquez is a very pleasant 81 y.o. male with medical history significant for end-stage renal disease on dialysis Monday Wednesday and Fridays, hypertension, diabetes, MI, stroke presents to room 2 on unit 4E at [facility] from [facility] him with the chief complaint facial swelling/sore throat. Initial evaluation included CT of the neck revealing retropharyngeal effusion and facial edema. [facility] asked to admit for further evaluation  Information is obtained from the patient and his wife who is at the bedside. Patient had a recent left arm AV graft placed. He said he went to dialysis this past Friday and shortly thereafter he noticed swelling in his arm on the left. He denies any pain numbness tingling. Wife indicates that last night she noticed the swelling spreading to his neck and this morning she awakened and his face was swollen and his eyes were "bulging". Associated symptoms include sore throat difficulty swallowing and "choking" when I try to drink. He denies fever chills nausea vomiting cough headache dizziness syncope or near-syncope. He denies chest pain palpitation shortness of breath. He states his "face felt funny" and he described it as a "tightness"  ED Course: On admission he is hemodynamically stable but he is starting to desat with conversation.  Review of Systems: As per HPI otherwise all other systems reviewed and are negative.   Ambulatory Status: Ambulates independently  Past Medical History:  Diagnosis Date  . Chronic kidney disease    dialysis, [name]- M,W,F  . Diabetes mellitus without complication (HCC)    lantus daily and humalog sliding scale, type2  . Dizziness   . History of blood transfusion   . Hypertension    takes Amlodipine every other day  . Myocardial  infarction (HCC)   . Pneumonia    last yr  . Stroke [facility])     Past Surgical History:  Procedure Laterality Date  . ARTERIOVENOUS GRAFT PLACEMENT    . AV FISTULA PLACEMENT  03/31/2012   Procedure: ARTERIOVENOUS (AV) FISTULA CREATION;  Surgeon: [name], MD;  Location: [facility] OR;  Service: Vascular;  Laterality: Right;  First stage Brachial vein transposition  . AV FISTULA PLACEMENT Right 08/25/2012   Procedure: INSERTION OF ARTERIOVENOUS (AV) GORE-TEX GRAFT ARM;  Surgeon: [name], MD;  Location: [facility] OR;  Service: Vascular;  Laterality: Right;  . AV FISTULA PLACEMENT Left 07/23/2016   Procedure: INSERTION OF ARTERIOVENOUS (AV) GORE-TEX GRAFT LEFT UPPER ARM;  Surgeon: [name], MD;  Location: [facility] OR;  Service: Vascular;  Laterality: Left;  . EYE SURGERY Bilateral    cataracts  . INSERTION OF DIALYSIS CATHETER Left 05/05/2012   Procedure: INSERTION OF DIALYSIS CATHETER;  Surgeon: Chuck Hint, MD;  Location: [facility] OR;  Service: Vascular;  Laterality: Left;  . IR FLUORO GUIDE CV LINE RIGHT  06/10/2016  . IR GENERIC HISTORICAL  06/07/2016   IR US GUIDE VASC ACCESS RIGHT 06/07/2016 Oley Balm, MD [facility]-INTERV RAD  . IR THROMBECTOMY AV FISTULA W/THROMBOLYSIS/PTA INC/SHUNT/IMG RIGHT Right 06/07/2016  . IR US GUIDE VASC ACCESS RIGHT  06/10/2016  . LIGATION OF ARTERIOVENOUS  FISTULA Right 08/25/2012   Procedure: LIGATION OF ARTERIOVENOUS  FISTULA;  Surgeon: [name], MD;  Location: [facility] OR;  Service: Vascular;  Laterality: Right;  Ligation of right brachial vein transposition  . REMOVAL OF A  DIALYSIS CATHETER Right 05/05/2012   Procedure: REMOVAL OF A DIALYSIS CATHETER;  Surgeon: Chuck Hint, MD;  Location: [facility] OR;  Service: Vascular;  Laterality: Right;  . TOE AMPUTATION    . UPPER EXTREMITY VENOGRAPHY Bilateral 07/18/2016   Procedure: Bilateral Upper Extremity Venography;  Surgeon: [name], MD;  Location: [facility] INVASIVE CV LAB;  Service: Cardiovascular;  Laterality: Bilateral;     Social History   Social History  . Marital status: Widowed    Spouse name: N/A  . Number of children: N/A  . Years of education: N/A   Occupational History  . Not on file.   Social History Main Topics  . Smoking status: Never Smoker  . Smokeless tobacco: Never Used  . Alcohol use Yes     Comment: one time per month  . Drug use: No  . Sexual activity: Yes   Other Topics Concern  . Not on file   Social History Narrative  . No narrative on file    Allergies  Allergen Reactions  . Lisinopril Other (See Comments)    REACTION: hyperkalemia/renal deterioration    Family History  Problem Relation Age of Onset  . Diabetes Mother   . Hypertension Mother   . Diabetes Father   . Hypertension Father     Prior to Admission medications   Medication Sig Start Date End Date Taking? Authorizing Provider  calcium carbonate (TUMS EX) 750 MG chewable tablet Chew 1 tablet by mouth 2 (two) times daily as needed for heartburn.   Yes [provider]  insulin glargine (LANTUS) 100 UNIT/ML injection Inject 5 Units into the skin at bedtime. Per BS   Yes [provider]  latanoprost (XALATAN) 0.005 % ophthalmic solution Place 1 drop into both eyes at bedtime.   Yes [provider]  losartan (COZAAR) 100 MG tablet Take 100 mg by mouth daily.   Yes [provider]  multivitamin (RENA-VIT) TABS tablet Take 1 tablet by mouth daily.   Yes [provider]  valACYclovir (VALTREX) 1000 MG tablet Take 1,000 mg by mouth daily.    Yes [provider]  oxyCODONE-acetaminophen (ROXICET) 5-325 MG tablet Take 1 tablet by mouth every 6 (six) hours as needed for moderate pain or severe pain. Patient not taking: Reported on 08/04/2016 07/23/16   Dara Lords, PA-C    Physical Exam: Vitals:   08/04/16 1559  BP: (!) 156/75  Pulse: 78  Resp: 18  Temp: 97.9 F (36.6 C)  TempSrc: Oral  Weight: 70.5 kg (155 lb 6.8 oz)  Height: 5\' 7"  (1.702 m)      General:  Appears calm and comfortable Sitting up in bed no acute distress Eyes:  Eyes puffy and bulging, face and head swollen Right greater than left ENT:  grossly normal hearing, lips & tongue, no erythema or exudate. Neck:  Generalized swelling nontender range of motion Cardiovascular:  RRR, no m/r/g. No LE edema.  Respiratory:  Mild increased work of breathing with conversation respirations shallow breath sounds distant Abdomen:  soft, ntnd, positive bowel sounds Skin:  no rash or induration seen on limited exam Musculoskeletal:  Left hand and wrist arm upper arm swollen and very tight left hand cool to touch pulse positive Psychiatric:  grossly normal mood and affect, speech fluent and appropriate, AOx3 Neurologic:  CN 2-12 grossly intact, moves all extremities in coordinated fashion, sensation intact  Labs on Admission: I have personally reviewed following labs and imaging studies  CBC:  Recent Labs  Lab 08/04/16 1620  WBC 6.2  HGB 10.5*  HCT 32.5*  MCV 93.1  PLT 114*   Basic Metabolic Panel:  Recent Labs Lab 08/04/16 1620  NA 133*  K 5.8*  CL 98*  CO2 25  GLUCOSE 339*  BUN 40*  CREATININE 7.50*  CALCIUM 8.0*   GFR: Estimated Creatinine Clearance: 7.6 mL/min (A) (by C-G formula based on SCr of 7.5 mg/dL (H)). Liver Function Tests:  Recent Labs Lab 08/04/16 1620  AST 52*  ALT 33  ALKPHOS 114  BILITOT 0.7  PROT 6.6  ALBUMIN 3.3*   No results for input(s): LIPASE, AMYLASE in the last 168 hours. No results for input(s): AMMONIA in the last 168 hours. Coagulation Profile:  Recent Labs Lab 08/04/16 1620  INR 0.93   Cardiac Enzymes: No results for input(s): CKTOTAL, CKMB, CKMBINDEX, TROPONINI in the last 168 hours. BNP (last 3 results) No results for input(s): PROBNP in the last 8760 hours. HbA1C: No results for input(s): HGBA1C in the last 72 hours. CBG: No results for input(s): GLUCAP in the last 168 hours. Lipid Profile: No results for  input(s): CHOL, HDL, LDLCALC, TRIG, CHOLHDL, LDLDIRECT in the last 72 hours. Thyroid Function Tests: No results for input(s): TSH, T4TOTAL, FREET4, T3FREE, THYROIDAB in the last 72 hours. Anemia Panel: No results for input(s): VITAMINB12, FOLATE, FERRITIN, TIBC, IRON, RETICCTPCT in the last 72 hours. Urine analysis: No results found for: COLORURINE, APPEARANCEUR, LABSPEC, PHURINE, GLUCOSEU, HGBUR, BILIRUBINUR, KETONESUR, PROTEINUR, UROBILINOGEN, NITRITE, LEUKOCYTESUR  Creatinine Clearance: Estimated Creatinine Clearance: 7.6 mL/min (A) (by C-G formula based on SCr of 7.5 mg/dL (H)).  Sepsis Labs: @LABRCNTIP (procalcitonin:4,lacticidven:4) )No results found for this or any previous visit (from the past 240 hour(s)).   Radiological Exams on Admission: No results found.  EKG: pending  Assessment/Plan Principal Problem:   Acute respiratory failure (HCC) Active Problems:   Diabetes (HCC)   Essential hypertension   GERD   RENAL DISEASE, CHRONIC, STAGE II   End stage renal disease (HCC)   Facial swelling   Effusion, other site   Exophthalmos   Hyperkalemia     #1. Acute respiratory failure secondary to facial/neck swelling of unknown etiology. Oxygen saturation level drops to 84% on room air at rest. Requiring more oxygen to maintain sats above 90%.  CT of the neck shows diffuse anasarca large retropharyngeal effusion. He did have left AV graft placed recently. Concern for occlusion versus infection. He is afebrile no leukocytosis non-toxic appearing. Discussed with vascular. Who noted recent venogram recently showed no central venous occlusion on the left it did show innominate occlusion on the right. -admit to SD -npo -solumedrol -anti-biotics -discussed with CC who recommend ENT  -oxygen supplementation -monitor oxygen saturation level -discussed with critical care -ENT consult requeste -vascular consult  #2. End-stage renal disease. Patient is a Monday Wednesday Friday  dialysis patient. Atropine 7. Potassium level 5.8. -nephrology consult -dialysis tomorrow likely  #3. Hypertension. Fair control on admission. Home medications include losartan -hold losartan for now due to npo status -prn hydralazine -monitor  #4. Diabetes. Type II. Serum glucose 339. Home medications include Lantus -Continue Lantus -Obtain a hemoglobin A1c -Sliding scale insulin for optimal control -CBGs every 4 hours  #5. Hyperkalemia. Potassium 5.8. Serum glucose 339 -IV insulin 8 units -Recheck tonight -Obtain an EKG -dialysis tomorrow   DVT prophylaxis: scd  Code Status: full  Family Communication: wife at bedside  Disposition Plan: home  Consults called: Dr. Arbie Cookey vascular,  Dr Suszanne Conners ENT, coloadonato nephrology Admission status:  inpatient   Gwenyth Bender MD [facility]  If 7PM-7AM, please contact night-coverage [email or web address] Password [facility]  08/04/2016, 6:03 PM

## 2016-08-04 NOTE — Progress Notes (Signed)
Pharmacy Antibiotic Note  Charles Vasquez is a 81 y.o. male with ESRD who presented to [facility] on 5/27 with facial swelling and exophtalmos with CT showing retropharyngeal swelling. The patient was transferred to [facility] for further management. Pharmacy consulted to dose Vancomycin for cellulitis/wound infection.   Per ED records - it appears that the patient received Vancomycin 1g x 1 prior to transfer.   Plan: 1. Give an additional Vancomycin 500 mg IV x 1 for a total loading dose of 1500 mg 2. Will obtain a Vancomycin random with AM labs to ensure levels adequate 3. No standing Vanc for now - will f/u level and HD schedule 4. Will continue to follow HD schedule/duration, culture results, LOT, and antibiotic de-escalation plans   Height: 5\' 7"  (170.2 cm) Weight: 155 lb 6.8 oz (70.5 kg) IBW/kg (Calculated) : 66.1  Temp (24hrs), Avg:97.9 F (36.6 C), Min:97.9 F (36.6 C), Max:97.9 F (36.6 C)  No results for input(s): WBC, CREATININE, LATICACIDVEN, VANCOTROUGH, VANCOPEAK, VANCORANDOM, GENTTROUGH, GENTPEAK, GENTRANDOM, TOBRATROUGH, TOBRAPEAK, TOBRARND, AMIKACINPEAK, AMIKACINTROU, AMIKACIN in the last 168 hours.  Estimated Creatinine Clearance: 7.8 mL/min (A) (by C-G formula based on SCr of 7.3 mg/dL (H)).    Allergies  Allergen Reactions  . Lisinopril Other (See Comments)    REACTION: hyperkalemia/renal deterioration    Antimicrobials this admission: Vanc 5/27 >>  Dose adjustments this admission:  Microbiology results:  Thank you for allowing pharmacy to be a part of this patient's care.  Georgina Pillion, PharmD, BCPS Clinical Pharmacist 08/04/2016 5:35 PM

## 2016-08-04 NOTE — Consult Note (Signed)
PULMONARY / CRITICAL CARE MEDICINE   Name: Charles Vasquez MRN: [id number] DOB: 1935/12/02    ADMISSION DATE:  08/04/2016 CONSULTATION DATE:  08/04/2016  REFERRING MD :  Dr. Melynda Ripple  CHIEF COMPLAINT:  Facial swelling  INITIAL PRESENTATION:  81 y.o. male with medical history significant for end-stage renal disease on dialysis Monday Wednesday and Fridays, hypertension, diabetes, MI, stroke presents to ED with several days of progressive facial and bilateral upper extremity L>R edema after recent left sided fistula creation.  He also complained of tightness in the face, dysphagia and a choking sensation.  SIGNIFICANT EVENTS: - To OR 08/04/2016 for left fistula ligation - Left intubated and transferred to MICU  PAST MEDICAL HISTORY :   has a past medical history of Chronic kidney disease; Diabetes mellitus without complication (HCC); Dizziness; History of blood transfusion; Hypertension; Myocardial infarction (HCC); Pneumonia; and Stroke (HCC).  has a past surgical history that includes Arteriovenous graft placement; Toe amputation; AV fistula placement (03/31/2012); Insertion of dialysis catheter (Left, 05/05/2012); Removal of a dialysis catheter (Right, 05/05/2012); AV fistula placement (Right, 08/25/2012); Ligation of arteriovenous  fistula (Right, 08/25/2012); ir generic historical (06/07/2016); IR US Guide Vasc Access Right (06/10/2016); IR Fluoro Guide CV Line Right (06/10/2016); IR THROMBECTOMY AV FISTULA/W THROMBOLYSIS/PTA INC SHUNT/IMG RIGHT (Right, 06/07/2016); Upper Extremity Venography (Bilateral, 07/18/2016); Eye surgery (Bilateral); and AV fistula placement (Left, 07/23/2016). Prior to Admission medications   Medication Sig Start Date End Date Taking? Authorizing Provider  calcium carbonate (TUMS EX) 750 MG chewable tablet Chew 1 tablet by mouth 2 (two) times daily as needed for heartburn.   Yes [provider]  insulin glargine (LANTUS) 100 UNIT/ML injection Inject 5 Units into the skin at  bedtime. Per BS   Yes [provider]  latanoprost (XALATAN) 0.005 % ophthalmic solution Place 1 drop into both eyes at bedtime.   Yes [provider]  losartan (COZAAR) 100 MG tablet Take 100 mg by mouth daily.   Yes [provider]  multivitamin (RENA-VIT) TABS tablet Take 1 tablet by mouth daily.   Yes [provider]  valACYclovir (VALTREX) 1000 MG tablet Take 1,000 mg by mouth daily.    Yes [provider]  oxyCODONE-acetaminophen (ROXICET) 5-325 MG tablet Take 1 tablet by mouth every 6 (six) hours as needed for moderate pain or severe pain. Patient not taking: Reported on 08/04/2016 07/23/16   Dara Lords, PA-C   Allergies  Allergen Reactions  . Lisinopril Other (See Comments)    REACTION: hyperkalemia/renal deterioration    FAMILY HISTORY:  indicated that his mother is deceased. He indicated that his father is deceased.   SOCIAL HISTORY:  reports that he has never smoked. He has never used smokeless tobacco. He reports that he drinks alcohol. He reports that he does not use drugs.  REVIEW OF SYSTEMS:   Unable to obtain.  SUBJECTIVE:   VITAL SIGNS: Temp:  [97.9 F (36.6 C)] 97.9 F (36.6 C) (05/27 2050) Pulse Rate:  [55-78] 55 (05/27 2115) Resp:  [13-18] 15 (05/27 2115) BP: (59-168)/(39-90) 149/90 (05/27 2115) SpO2:  [85 %-95 %] 95 % (05/27 2115) FiO2 (%):  [60 %] 60 % (05/27 2020) Weight:  [70.5 kg (155 lb 6.8 oz)] 70.5 kg (155 lb 6.8 oz) (05/27 1559) HEMODYNAMICS:   VENTILATOR SETTINGS: Vent Mode: PRVC FiO2 (%):  [60 %] 60 % Set Rate:  [15 bmp] 15 bmp Vt Set:  [500 mL] 500 mL PEEP:  [5 cmH20] 5 cmH20 INTAKE / OUTPUT:  Intake/Output  Summary (Last 24 hours) at 08/04/16 2319 Last data filed at 08/04/16 2019  Gross per 24 hour  Intake              750 ml  Output                5 ml  Net              745 ml    PHYSICAL EXAMINATION: General:  NAD Neuro:  Opens eyes to command, follows commands off propofol,  antigravity x4 HEENT:  ETT in place, + facial and neck swelling Cardiovascular:  Bradycardic, no m/r/g, s1/s2 Lungs:  Mechanical breath sounds bilaterally Abdomen:  Soft, non tender, normal bowel sounds Musculoskeletal:  Bilateral UE edema with cool hands/digits.  Left fistula- without bruit or thrill. Right sided fistula + bruit and thrill Skin:  B/l UE edema with cool hands and digits. Left anticubital skin tear/wound Pulses: 1+ right radial, 1+ Left radial, 2+ bilateral femoral, 1+ bilateral DP  LABS:  CBC  Recent Labs Lab 08/04/16 1620  WBC 6.2  HGB 10.5*  HCT 32.5*  PLT 114*   Coag's  Recent Labs Lab 08/04/16 1620  INR 0.93   BMET  Recent Labs Lab 08/04/16 1620 08/04/16 2155  NA 133* 133*  K 5.8* 4.3  CL 98* 99*  CO2 25 21*  BUN 40* 42*  CREATININE 7.50* 7.48*  GLUCOSE 339* 301*   Electrolytes  Recent Labs Lab 08/04/16 1620 08/04/16 2155  CALCIUM 8.0* 8.0*   Sepsis Markers No results for input(s): LATICACIDVEN, PROCALCITON, O2SATVEN in the last 168 hours. ABG No results for input(s): PHART, PCO2ART, PO2ART in the last 168 hours. Liver Enzymes  Recent Labs Lab 08/04/16 1620  AST 52*  ALT 33  ALKPHOS 114  BILITOT 0.7  ALBUMIN 3.3*   Cardiac Enzymes No results for input(s): TROPONINI, PROBNP in the last 168 hours. Glucose  Recent Labs Lab 08/04/16 2041  GLUCAP 244*    Imaging No results found.   ASSESSMENT / PLAN: 81 yo AAM with ESRD on HD s/p plethora of the neck/face and UE without CT evidence of SVC obstruction.  The leading differential is a dysfunctional fistula given the proximity to its creation.  He is found to have a retropharyngeal fluid collection of unclear etiology and significance.  PULMONARY A: Intubated for surgery P:   - maintain ETT for now given retrophyngeal fluid - VAP prevention bundle - CXR - ABG - will leave intubated tonight - Per CRNA intubation was not difficult and no obvious tracheal compression  was noted. - Plan for extubation tomorrow - ENT consult for evaluation of retropharyngeal fluid collection tomorrow  CARDIOVASCULAR A:  HTN - at baseline Hypotension - likely post procedural compounded with UE edema P:  - BP cuff moved to legs with better reading - Patient not demonstrating any signs of shock - PRN hydralizine if SBP >180  RENAL A:   CKD NAGMA P:   - No emergent HD needs - Nephrology consult in AM  - Resume HD schedule with right sided permcath  GASTROINTESTINAL A:   Not active P:   - PPI prophylaxis  HEMATOLOGIC A:   Anemia of chronic disease Thrombocytopenia P:  - Hemoglobin and platelets at baseline - Trend CBC - no indication for transfusion - Transfuse for Hb <7  INFECTIOUS A:   Retropharyngeal fluid collection without obvious sepsis P:   BCx2 08/04/2016 UC N/a Sputum 08/04/2016 Abx: Vancomycin x1 dosed by random level and  HD schedule  ENDOCRINE A:   DM   P:   - SSI - start basal bolus tomorrow with feeding  NEUROLOGIC A:   Not active P:   RASS goal: 0 - Propofol  FEN: Tube feeds tomorrow if not extubated DVT ppx: Heparin 5000 SubQ Code status: FULL   FAMILY  - Updates: updated family at bedside  Total critical care time: 30 min  Critical care time was exclusive of separately billable procedures and treating other patients.  Critical care was necessary to treat or prevent imminent or life-threatening deterioration.  Critical care was time spent personally by me on the following activities: development of treatment plan with patient and/or surrogate as well as nursing, discussions with consultants, evaluation of patient's response to treatment, examination of patient, obtaining history from patient or surrogate, ordering and performing treatments and interventions, ordering and review of laboratory studies, ordering and review of radiographic studies, pulse oximetry and re-evaluation of patient's condition.   Galvin Proffer, DO., MS [facility]  Pulmonary and Critical Care Medicine [facility] Pager: [phone]  08/04/2016, 11:19 PM

## 2016-08-04 NOTE — Telephone Encounter (Signed)
I received a call from the emergency room physician at [facility]. The patient was in the emergency room presenting with facial swelling and exophthalmos. CT showed retropharyngeal swelling. I reviewed the films. The patient had a recent left arm AV graft with [name]. 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 The patient will be transferred to [facility] for further evaluation and treatment to the hospitalist service. I do not see any specific cause of his new left arm graft related to this.

## 2016-08-04 NOTE — Consult Note (Signed)
Patient name: Charles Vasquez MRN: [id number] DOB: 18-Dec-1935 Sex: male    HPI: Charles Vasquez is a 81 y.o. male seen for facial swelling and arm swelling and some hoarseness. He has a very long standing history of end-stage renal disease with multiple prior grafts in both extremities. He recently underwent venogram for assessment of options. 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 Left arm venogram showed patency of the subclavian vein. I reviewed his actual images and the contrast is less clear more centrally. There is some hang-up of contrast as well. The patient does have swelling underwent a left upper arm graft as his only other upper extremity option on 5:15 with [name]. He reports this morning that when he awakened he noticed that his face was more swelling and he had some hoarseness. He presented to [facility]. CTD scan showed retropharyngeal edema of unclear etiology. He was transferred to [facility] we're seeing him in consultation. He is in no distress currently. He does have some hoarseness. He does have some increased swelling in his face and both arms but reports this is better than this morning.  Current Facility-Administered Medications  Medication Dose Route Frequency Provider Last Rate Last Dose  . 0.9 %  sodium chloride infusion   Intravenous Continuous Black, Lesle Chris, NP      . acetaminophen (TYLENOL) tablet 650 mg  650 mg Oral Q6H PRN Gwenyth Bender, NP       Or  . acetaminophen (TYLENOL) suppository 650 mg  650 mg Rectal Q6H PRN Gwenyth Bender, NP      . albuterol (PROVENTIL) (2.5 MG/3ML) 0.083% nebulizer solution 2.5 mg  2.5 mg Nebulization Q6H Haydee Salter, MD      . insulin aspart (novoLOG) injection 0-5 Units  0-5 Units Subcutaneous QHS Black, Karen M, NP      . insulin aspart (novoLOG) injection 0-9 Units  0-9 Units Subcutaneous TID WC Black, Lesle Chris, NP      . insulin glargine (LANTUS) injection 5 Units  5 Units Subcutaneous QHS  Black, Karen M, NP      . ondansetron [facility]) tablet 4 mg  4 mg Oral Q6H PRN Gwenyth Bender, NP       Or  . ondansetron [facility]) injection 4 mg  4 mg Intravenous Q6H PRN Black, Karen M, NP      . sodium chloride flush (NS) 0.9 % injection 3 mL  3 mL Intravenous Q12H Black, Lesle Chris, NP        REVIEW OF SYSTEMS:  [X]  denotes positive finding, [ ]  denotes negative finding Cardiac  Comments:  Chest pain or chest pressure:    Shortness of breath upon exertion:    Short of breath when lying flat:    Irregular heart rhythm:    Constitutional    Fever or chills:      PHYSICAL EXAM: Vitals:   08/04/16 1559  BP: (!) 156/75  Pulse: 78  Resp: 18  Temp: 97.9 F (36.6 C)  TempSrc: Oral  Weight: 155 lb 6.8 oz (70.5 kg)  Height: 5\' 7"  (1.702 m)    GENERAL: The patient is a well-nourished male, in no acute distress. The vital signs are documented above. CARDIOVASCULAR: There is a regular rate and rhythm. PULMONARY: There is good air exchange bilaterally without wheezing or rales. Significant swelling in his face and that both arms. More so on the left on the right. Antecubital and axillary incisions  healing. He does have a bruit over his upper arm graft  MEDICAL ISSUES:  12 day status post upper arm AV graft. Unusual presentation with retropharyngeal edema. I do feel that this is most likely related to central venous occlusion with increased flow into his left arm. He does have some distended collaterals over his neck and chest as well. I discussed this with the patient and his family present. Feel the only option would be ligation of his graft. He is having good function of his current right IJ hemodialysis catheter. I explained that his only option going forward would be of leg graft. He is very hesitant to agree to this option. Explained that this could be discussed as an outpatient. He does have easily palpable popliteal pulses bilaterally. 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 Will plan for ligation of his graft in the morning. Suspect will require several days for the swelling to resolve.  Gretta Began [facility] [employer] [facility] [phone]

## 2016-08-04 NOTE — Progress Notes (Signed)
Patient ID: Charles Vasquez, male   DOB: 1936-01-06, 81 y.o.   MRN: [id number] Discussed with Dr. Shon Baton. Initially the patient was maintaining normal oxygen saturations on 2 L nasal cannula. Currently he is on 6 L with a saturation of 88-90. Continues to have some hoarseness. He is not in any respiratory stridor at this moment. Discussed with the patient the safest route would be ligation of the fistula tonight under general anesthesia with endotracheal tube. Dr. Shon Baton as discussed this with the pulmonary critical care who agrees to monitor him intubated in the unit as the safest option. We will intubate him in the operating room and ligate his left arm fistula. Explained to the patient is will not immediately take care of the swelling and that he may require several days of intubation. He ate around 1 PM. Will plan surgery now.

## 2016-08-04 NOTE — Op Note (Signed)
    OPERATIVE REPORT  DATE OF SURGERY: 08/04/2016  PATIENT: Charles Vasquez, 81 y.o. 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  PHYSICIAN ASSISTANT: Nurse  ANESTHESIA:  Gen. endotracheal  EBL: Minimal ml  No intake/output data recorded.  BLOOD ADMINISTERED: None  DRAINS: None  SPECIMEN: None  COUNTS CORRECT:  YES  PLAN OF CARE: Medical intensive care unit intubated   PATIENT DISPOSITION:  PACU - hemodynamically stable  PROCEDURE DETAILS: Patient presented with the facial and left arm swelling and some hoarseness and difficulty breathing after placement of a left upper arm AV graft 12 days ago. It appeared the collateral formation over his chest. 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 He was having some difficulty maintaining saturations after discussion with the admitting physician the decision was to intubate the patient and ligated fistula. I discussed this at length with the patient and his family and explained this would not give immediate relief or swelling even if this was the cause and he may need to be intubated for several days.  Procedure in detail the patient was taken to the operating placed supine position and there was no difficulty in intubation and the the nurse anesthetist reported no unusual findings when intubating the patient. The left arm was prepped in usual sterile fashion. There was some separation in the lower incision at the antecubital space. Therefore the upper incision was opened and the Gore-Tex graft was doubly ligated with 2-0 silk ties. The wound was irrigated with saline and hemostasis tablet cautery. The wound was closed with 3-0 Vicryl in the subcutaneous and  subcuticular tissue. Sterile dressing was applied the patient was transferred to the medical intensive care unit in stable condition   Larina Earthly, M.D., [facility] 08/04/2016 7:38 PM

## 2016-08-04 NOTE — Anesthesia Preprocedure Evaluation (Signed)
Anesthesia Evaluation  Patient identified by MRN, date of birth, ID band Patient awake    Reviewed: Allergy & Precautions, NPO status , Patient's Chart, lab work & pertinent test results  Airway Mallampati: II  TM Distance: >3 FB Neck ROM: Full    Dental   Pulmonary    Pulmonary exam normal        Cardiovascular hypertension, Pt. on medications + Past MI  Normal cardiovascular exam     Neuro/Psych    GI/Hepatic GERD  Controlled and Medicated,  Endo/Other  diabetes, Poorly Controlled, Type 2, Insulin Dependent  Renal/GU ESRF and DialysisRenal disease     Musculoskeletal   Abdominal   Peds  Hematology   Anesthesia Other Findings   Reproductive/Obstetrics                             Anesthesia Physical Anesthesia Plan  ASA: III and emergent  Anesthesia Plan: General   Post-op Pain Management:    Induction: Intravenous  Airway Management Planned: Oral ETT  Additional Equipment:   Intra-op Plan:   Post-operative Plan: Post-operative intubation/ventilation  Informed Consent: I have reviewed the patients History and Physical, chart, labs and discussed the procedure including the risks, benefits and alternatives for the proposed anesthesia with the patient or authorized representative who has indicated his/her understanding and acceptance.     Plan Discussed with: CRNA and Surgeon  Anesthesia Plan Comments:         Anesthesia Quick Evaluation

## 2016-08-05 ENCOUNTER — Inpatient Hospital Stay (HOSPITAL_COMMUNITY): Payer: Medicare Other

## 2016-08-05 ENCOUNTER — Encounter (HOSPITAL_COMMUNITY): Payer: Self-pay | Admitting: Vascular Surgery

## 2016-08-05 DIAGNOSIS — J9601 Acute respiratory failure with hypoxia: Secondary | ICD-10-CM

## 2016-08-05 LAB — CBC
HCT: 30.2 % — ABNORMAL LOW (ref 39.0–52.0)
HEMOGLOBIN: 10 g/dL — AB (ref 13.0–17.0)
MCH: 30.1 pg (ref 26.0–34.0)
MCHC: 33.1 g/dL (ref 30.0–36.0)
MCV: 91 fL (ref 78.0–100.0)
PLATELETS: 133 10*3/uL — AB (ref 150–400)
RBC: 3.32 MIL/uL — ABNORMAL LOW (ref 4.22–5.81)
RDW: 15.5 % (ref 11.5–15.5)
WBC: 11.3 10*3/uL — ABNORMAL HIGH (ref 4.0–10.5)

## 2016-08-05 LAB — BLOOD GAS, ARTERIAL
ACID-BASE DEFICIT: 3.7 mmol/L — AB (ref 0.0–2.0)
Bicarbonate: 20.7 mmol/L (ref 20.0–28.0)
DRAWN BY: 236041
FIO2: 60
MECHVT: 500 mL
O2 SAT: 99.4 %
PATIENT TEMPERATURE: 98.6
PEEP/CPAP: 5 cmH2O
PH ART: 7.367 (ref 7.350–7.450)
RATE: 15 resp/min
pCO2 arterial: 37 mmHg (ref 32.0–48.0)
pO2, Arterial: 286 mmHg — ABNORMAL HIGH (ref 83.0–108.0)

## 2016-08-05 LAB — BASIC METABOLIC PANEL
ANION GAP: 15 (ref 5–15)
BUN: 50 mg/dL — ABNORMAL HIGH (ref 6–20)
CALCIUM: 8.2 mg/dL — AB (ref 8.9–10.3)
CO2: 19 mmol/L — ABNORMAL LOW (ref 22–32)
CREATININE: 8.09 mg/dL — AB (ref 0.61–1.24)
Chloride: 101 mmol/L (ref 101–111)
GFR calc Af Amer: 6 mL/min — ABNORMAL LOW (ref >60)
GFR, EST NON AFRICAN AMERICAN: 6 mL/min — AB (ref >60)
GLUCOSE: 215 mg/dL — AB (ref 65–99)
Potassium: 4.8 mmol/L (ref 3.5–5.1)
Sodium: 135 mmol/L (ref 135–145)

## 2016-08-05 LAB — GLUCOSE, CAPILLARY
GLUCOSE-CAPILLARY: 173 mg/dL — AB (ref 65–99)
GLUCOSE-CAPILLARY: 229 mg/dL — AB (ref 65–99)
GLUCOSE-CAPILLARY: 86 mg/dL (ref 65–99)
Glucose-Capillary: 188 mg/dL — ABNORMAL HIGH (ref 65–99)
Glucose-Capillary: 112 mg/dL — ABNORMAL HIGH (ref 65–99)
Glucose-Capillary: 116 mg/dL — ABNORMAL HIGH (ref 65–99)
Glucose-Capillary: 133 mg/dL — ABNORMAL HIGH (ref 65–99)

## 2016-08-05 LAB — TRIGLYCERIDES: TRIGLYCERIDES: 70 mg/dL (ref <150)

## 2016-08-05 LAB — VANCOMYCIN, RANDOM: VANCOMYCIN RM: 17

## 2016-08-05 MED ORDER — CHLORHEXIDINE GLUCONATE 0.12% ORAL RINSE (MEDLINE KIT)
15.0000 mL | Freq: Two times a day (BID) | OROMUCOSAL | Status: DC
  Administered 2016-08-05 – 2016-08-06 (×3): 15 mL via OROMUCOSAL

## 2016-08-05 MED ORDER — PENTAFLUOROPROP-TETRAFLUOROETH EX AERO: 1.0000 "application " | INHALATION_SPRAY | CUTANEOUS | Status: DC | PRN

## 2016-08-05 MED ORDER — VANCOMYCIN HCL IN DEXTROSE 750-5 MG/150ML-% IV SOLN
750.0000 mg | INTRAVENOUS | Status: DC
  Administered 2016-08-05: 750 mg via INTRAVENOUS
  Filled 2016-08-05: qty 150

## 2016-08-05 MED ORDER — FAMOTIDINE IN NACL 20-0.9 MG/50ML-% IV SOLN
20.0000 mg | INTRAVENOUS | Status: DC
  Administered 2016-08-05 – 2016-08-06 (×2): 20 mg via INTRAVENOUS
  Filled 2016-08-05 (×2): qty 50

## 2016-08-05 MED ORDER — SODIUM CHLORIDE 0.9 % IV SOLN: 100.0000 mL | INTRAVENOUS | Status: DC | PRN

## 2016-08-05 MED ORDER — ORAL CARE MOUTH RINSE
15.0000 mL | Freq: Four times a day (QID) | OROMUCOSAL | Status: DC
  Administered 2016-08-05 – 2016-08-06 (×5): 15 mL via OROMUCOSAL

## 2016-08-05 MED ORDER — ALTEPLASE 2 MG IJ SOLR
2.0000 mg | Freq: Once | INTRAMUSCULAR | Status: DC | PRN
  Filled 2016-08-05: qty 2

## 2016-08-05 MED ORDER — HEPARIN SODIUM (PORCINE) 1000 UNIT/ML DIALYSIS
1000.0000 [IU] | INTRAMUSCULAR | Status: DC | PRN
  Filled 2016-08-05: qty 1

## 2016-08-05 MED ORDER — HEPARIN SODIUM (PORCINE) 5000 UNIT/ML IJ SOLN
5000.0000 [IU] | Freq: Three times a day (TID) | INTRAMUSCULAR | Status: DC
  Administered 2016-08-05 – 2016-08-10 (×18): 5000 [IU] via SUBCUTANEOUS
  Filled 2016-08-05 (×18): qty 1

## 2016-08-05 MED ORDER — LIDOCAINE HCL (PF) 1 % IJ SOLN: 5.0000 mL | INTRAMUSCULAR | Status: DC | PRN

## 2016-08-05 MED ORDER — LIDOCAINE-PRILOCAINE 2.5-2.5 % EX CREA
1.0000 "application " | TOPICAL_CREAM | CUTANEOUS | Status: DC | PRN
  Filled 2016-08-05: qty 5

## 2016-08-05 NOTE — Anesthesia Postprocedure Evaluation (Addendum)
Anesthesia Post Note  Patient: PAZ BOULAIS  Procedure(s) Performed: Procedure(s) (LRB): LIGATION ARTERIOVENOUS GORTEX GRAFT (Left)  Patient location during evaluation: SICU Anesthesia Type: General Level of consciousness: patient remains intubated per anesthesia plan and sedated Pain management: pain level controlled Vital Signs Assessment: post-procedure vital signs reviewed and stable Respiratory status: patient connected to nasal cannula oxygen, patient remains intubated per anesthesia plan and patient on ventilator - see flowsheet for VS Cardiovascular status: blood pressure returned to baseline and stable Postop Assessment: no signs of nausea or vomiting Anesthetic complications: no       Last Vitals:  Vitals:   08/05/16 2000 08/05/16 2002  BP: 118/72 96/73  Pulse: 85 85  Resp: 13 15  Temp:  36.5 C    Last Pain:  Vitals:   08/05/16 2002  TempSrc: Oral  PainSc:                  Viraj Liby DAVID

## 2016-08-05 NOTE — Progress Notes (Signed)
Subjective: Interval History: none.. Sedated. Hemodynamically stable on vent.  Objective: Vital signs in last 24 hours: Temp:  [97.4 F (36.3 C)-98.4 F (36.9 C)] 97.4 F (36.3 C) (05/28 0807) Pulse Rate:  [51-78] 52 (05/28 0600) Resp:  [9-18] 15 (05/28 0600) BP: (59-170)/(36-90) 97/46 (05/28 0600) SpO2:  [85 %-100 %] 89 % (05/28 0600) FiO2 (%):  [40 %-60 %] 40 % (05/28 0321) Weight:  [155 lb 6.8 oz (70.5 kg)-157 lb 6.5 oz (71.4 kg)] 157 lb 6.5 oz (71.4 kg) (05/28 0356)  Intake/Output from previous day: 05/27 0701 - 05/28 0700 In: 899.4 [I.V.:599.4; IV Piggyback:300] Out: 5 [Blood:5] Intake/Output this shift: No intake/output data recorded.  No significant change regarding a left arm swelling. Does have a thrill in his right upper arm graft.  Lab Results:  Recent Labs  08/04/16 1620  WBC 6.2  HGB 10.5*  HCT 32.5*  PLT 114*   BMET  Recent Labs  08/04/16 1620 08/04/16 2155  NA 133* 133*  K 5.8* 4.3  CL 98* 99*  CO2 25 21*  GLUCOSE 339* 301*  BUN 40* 42*  CREATININE 7.50* 7.48*  CALCIUM 8.0* 8.0*    Studies/Results: Dg Chest Port 1 View  Result Date: 08/05/2016 CLINICAL DATA:  Endotracheal tube placement.  Initial encounter. EXAM: PORTABLE CHEST 1 VIEW COMPARISON:  Chest radiograph performed 08/04/2016 FINDINGS: The patient's endotracheal tube is seen ending 4 cm above the carina. A right-sided dual-lumen catheter is noted ending about the cavoatrial junction. Left basilar airspace opacity is mildly worsened and raises concern for pneumonia. No pleural effusion or pneumothorax is seen. The cardiomediastinal silhouette is borderline normal in size. No acute osseous abnormalities are seen. IMPRESSION: 1. Endotracheal tube seen ending 4 cm above the carina. 2. Left basilar airspace opacity is mildly worsened and raises concern for pneumonia. Electronically Signed   By: Roanna Raider M.D.   On: 08/05/2016 01:16   Anti-infectives: Anti-infectives    Start      Dose/Rate Route Frequency Ordered Stop   08/04/16 1800  vancomycin (VANCOCIN) 500 mg in sodium chloride 0.9 % 100 mL IVPB     500 mg 100 mL/hr over 60 Minutes Intravenous  Once 08/04/16 1735 08/04/16 1930      Assessment/Plan: s/p Procedure(s): LIGATION ARTERIOVENOUS GORTEX GRAFT (Left) Plan for pulmonary critical care. Continue hemodialysis via right IJ catheter. Following with you.   LOS: 1 day   Gretta Began 08/05/2016, 8:18 AM

## 2016-08-05 NOTE — Progress Notes (Signed)
PULMONARY / CRITICAL CARE MEDICINE   Name: Charles Vasquez MRN: [id number] DOB: 1935-04-30    ADMISSION DATE:  08/04/2016 CONSULTATION DATE:  08/04/2016  REFERRING MD :  Dr. Melynda Ripple  CHIEF COMPLAINT:  Facial swelling  INITIAL PRESENTATION:  81 y.o. male with medical history significant for end-stage renal disease on dialysis Monday Wednesday and Fridays, hypertension, diabetes, MI, stroke presents to ED with several days of progressive facial and bilateral upper extremity L>R edema after recent left sided fistula creation.  He also complained of tightness in the face, dysphagia and a choking sensation.  SIGNIFICANT EVENTS: 5/27 Lt fistula ligation 5/28 Left on vent after procedure  SUBJECTIVE:  No cuff leak.  VITAL SIGNS: Temp:  [97.4 F (36.3 C)-98.4 F (36.9 C)] 97.4 F (36.3 C) (05/28 0807) Pulse Rate:  [51-85] 85 (05/28 1000) Resp:  [9-21] 21 (05/28 1000) BP: (59-170)/(36-90) 160/60 (05/28 0900) SpO2:  [85 %-100 %] 100 % (05/28 1016) FiO2 (%):  [40 %-60 %] 40 % (05/28 1016) Weight:  [155 lb 6.8 oz (70.5 kg)-157 lb 6.5 oz (71.4 kg)] 157 lb 6.5 oz (71.4 kg) (05/28 0356)  VENTILATOR SETTINGS: Vent Mode: PSV;CPAP FiO2 (%):  [40 %-60 %] 40 % Set Rate:  [15 bmp] 15 bmp Vt Set:  [500 mL] 500 mL PEEP:  [5 cmH20] 5 cmH20 Pressure Support:  [5 cmH20] 5 cmH20 Plateau Pressure:  [17 cmH20] 17 cmH20   INTAKE / OUTPUT: I/O last 3 completed shifts: In: 910 [I.V.:610; IV Piggyback:300] Out: 5 [Blood:5]   PHYSICAL EXAMINATION:  General - pleasant Eyes - pupils reactive ENT - ETT in place Cardiac - regular, no murmur Chest - no wheeze, rales Abd - soft, non tender Ext - 1+ edema Skin - no rashes Neuro - RASS 0   LABS:  CBC  Recent Labs Lab 08/04/16 1620 08/05/16 0808  WBC 6.2 11.3*  HGB 10.5* 10.0*  HCT 32.5* 30.2*  PLT 114* 133*   Coag's  Recent Labs Lab 08/04/16 1620  INR 0.93   BMET  Recent Labs Lab 08/04/16 1620 08/04/16 2155 08/05/16 0808  NA  133* 133* 135  K 5.8* 4.3 4.8  CL 98* 99* 101  CO2 25 21* 19*  BUN 40* 42* 50*  CREATININE 7.50* 7.48* 8.09*  GLUCOSE 339* 301* 215*   Electrolytes  Recent Labs Lab 08/04/16 1620 08/04/16 2155 08/05/16 0808  CALCIUM 8.0* 8.0* 8.2*   Sepsis Markers No results for input(s): LATICACIDVEN, PROCALCITON, O2SATVEN in the last 168 hours.   ABG  Recent Labs Lab 08/05/16 0020  PHART 7.367  PCO2ART 37.0  PO2ART 286*   Liver Enzymes  Recent Labs Lab 08/04/16 1620  AST 52*  ALT 33  ALKPHOS 114  BILITOT 0.7  ALBUMIN 3.3*   Cardiac Enzymes No results for input(s): TROPONINI, PROBNP in the last 168 hours.   Glucose  Recent Labs Lab 08/04/16 2041 08/04/16 2343 08/05/16 0423 08/05/16 0804  GLUCAP 244* 257* 229* 188*    Imaging Dg Chest Port 1 View  Result Date: 08/05/2016 CLINICAL DATA:  Endotracheal tube placement.  Initial encounter. EXAM: PORTABLE CHEST 1 VIEW COMPARISON:  Chest radiograph performed 08/04/2016 FINDINGS: The patient's endotracheal tube is seen ending 4 cm above the carina. A right-sided dual-lumen catheter is noted ending about the cavoatrial junction. Left basilar airspace opacity is mildly worsened and raises concern for pneumonia. No pleural effusion or pneumothorax is seen. The cardiomediastinal silhouette is borderline normal in size. No acute osseous abnormalities are seen. IMPRESSION: 1. Endotracheal  tube seen ending 4 cm above the carina. 2. Left basilar airspace opacity is mildly worsened and raises concern for pneumonia. Electronically Signed   By: Roanna Raider M.D.   On: 08/05/2016 01:16    SUMMARY: 81 yo male with facial/neck swelling after procedure.  Left on vent.  ASSESSMENT / PLAN:  Acute hypoxic respiratory failure with upper airway edema. - continue vent support - f/u CXR  ESRD. - for HD 5/28  Hx of HTN. - monitor hemodynamics  Anemia of chronic disease. Thrombocytopenia. - f/u CBC  DM type II. - SSI  CC time 38  minutes  Coralyn Helling, MD [facility] Pulmonary/Critical Care 08/05/2016, 10:39 AM Pager:  [phone] After 3pm call: [phone]

## 2016-08-05 NOTE — Progress Notes (Signed)
Pharmacy Antibiotic Note  Charles Vasquez is a 81 y.o. male with ESRD who presented to [facility] on 5/27 with facial swelling and exophtalmos with CT showing retropharyngeal swelling. The patient was transferred to [facility] for further management. Pharmacy consulted to dose Vancomycin for cellulitis/wound infection. Pt is afebrile and WBC is slightly elevated. A random vancomycin level today is at goal at 17. Planning HD today.  Plan: Vancomycin 750mg  QHD F/u renal plans, C&S, clinical status and pre-HD van levels as appropriate  Height: 5\' 7"  (170.2 cm) Weight: 157 lb 6.5 oz (71.4 kg) IBW/kg (Calculated) : 66.1  Temp (24hrs), Avg:97.9 F (36.6 C), Min:97.4 F (36.3 C), Max:98.4 F (36.9 C)   Recent Labs Lab 08/04/16 1620 08/04/16 2155 08/05/16 0808  WBC 6.2  --  11.3*  CREATININE 7.50* 7.48* 8.09*  VANCORANDOM  --   --  17    Estimated Creatinine Clearance: 7 mL/min (A) (by C-G formula based on SCr of 8.09 mg/dL (H)).    Allergies  Allergen Reactions  . Lisinopril Other (See Comments)    REACTION: hyperkalemia/renal deterioration    Antimicrobials this admission: Vanc 5/27 >>  Dose adjustments this admission:  Microbiology results:  Thank you for allowing pharmacy to be a part of this patient's care.  [name], PharmD, BCPS Pager # [phone] 08/05/2016 8:58 AM

## 2016-08-05 NOTE — Progress Notes (Signed)
At 1645 pt has been on HD for about 2 hours. During a pt reassessment pt not responding so propofol was turned off. Reassessment showed no change in mental status. Pt unresponsive to sternal rub, pupils 3 and equal but sluggish, no gag noted. Verified with second RN. CBG 116. BP on left leg and inconsistent with BP. BP cuff saying BP 150's/130's. Attempted taking BP in different location but inconsistent BP noted again. HR in the 80's.  HD has been running for about 2 hours. HD RN called Renal MD and HD stopped. 1.5 liters pulled off per HD RN.  Pola Corn MD called and notified. Waiting on provider to come to bedside. Pts girlfriend at bedside and made aware of change. Will continue to monitor closely.   At 1750 pt starting to wake up and able to nod to simple commands but falls back to sleep quickly. Pt nods "yes" to feeling light headed. BP reading 105/78, HR 90.   At 1800 pt following more commands. BP reading 122/69, HR 93. Family at bedside. Will continue to monitor.

## 2016-08-05 NOTE — Consult Note (Signed)
Reason for Consult: Continuity of ESRD care Referring Physician: Chesley Mires M.D. (CCM)  HPI:  81 year old African-American man with past medical history significant for type 2 diabetes mellitus, hypertension and end-stage renal disease on hemodialysis on a Monday/Wednesday/Friday schedule at the [facility] unit in [place]. He presented to the emergency room yesterday with bilateral arm swelling, facial swelling and increasing hoarseness after left upper arm AV graft creation about 12 days ago. Previous workup was significant for contralateral (right side) innominate vein occlusion. Later yesterday evening, he underwent ligation of the left upper arm AV graft by Dr. Donnetta Hutching to limit further venous hypertension with previous documented central venous lesions. Because of retropharyngeal edema, he was intubated postoperatively.  I have contacted his dialysis unit for his current dialysis prescription.  Past Medical History:  Diagnosis Date  . Chronic kidney disease    dialysis, [name]- M,W,F  . Diabetes mellitus without complication (HCC)    lantus daily and humalog sliding scale, type2  . Dizziness   . History of blood transfusion   . Hypertension    takes Amlodipine every other day  . Myocardial infarction ([place])   . Pneumonia    last yr  . Stroke [facility])     Past Surgical History:  Procedure Laterality Date  . ARTERIOVENOUS GRAFT PLACEMENT    . AV FISTULA PLACEMENT  03/31/2012   Procedure: ARTERIOVENOUS (AV) FISTULA CREATION;  Surgeon: Conrad Elkhart, MD;  Location: [place];  Service: Vascular;  Laterality: Right;  First stage Brachial vein transposition  . AV FISTULA PLACEMENT Right 08/25/2012   Procedure: INSERTION OF ARTERIOVENOUS (AV) GORE-TEX GRAFT ARM;  Surgeon: Conrad Emmet, MD;  Location: [place];  Service: Vascular;  Laterality: Right;  . AV FISTULA PLACEMENT Left 07/23/2016   Procedure: INSERTION OF ARTERIOVENOUS (AV) GORE-TEX GRAFT LEFT UPPER ARM;  Surgeon: Conrad Mendon, MD;  Location: [place];   Service: Vascular;  Laterality: Left;  . EYE SURGERY Bilateral    cataracts  . INSERTION OF DIALYSIS CATHETER Left 05/05/2012   Procedure: INSERTION OF DIALYSIS CATHETER;  Surgeon: Angelia Mould, MD;  Location: [place];  Service: Vascular;  Laterality: Left;  . IR FLUORO GUIDE CV LINE RIGHT  06/10/2016  . IR GENERIC HISTORICAL  06/07/2016   IR US GUIDE VASC ACCESS RIGHT 06/07/2016 Arne [place], MD [facility]-INTERV RAD  . IR THROMBECTOMY AV FISTULA W/THROMBOLYSIS/PTA INC/SHUNT/IMG RIGHT Right 06/07/2016  . IR US GUIDE VASC ACCESS RIGHT  06/10/2016  . LIGATION OF ARTERIOVENOUS  FISTULA Right 08/25/2012   Procedure: LIGATION OF ARTERIOVENOUS  FISTULA;  Surgeon: Conrad Ronda, MD;  Location: [place];  Service: Vascular;  Laterality: Right;  Ligation of right brachial vein transposition  . REMOVAL OF A DIALYSIS CATHETER Right 05/05/2012   Procedure: REMOVAL OF A DIALYSIS CATHETER;  Surgeon: Angelia Mould, MD;  Location: [place];  Service: Vascular;  Laterality: Right;  . TOE AMPUTATION    . UPPER EXTREMITY VENOGRAPHY Bilateral 07/18/2016   Procedure: Bilateral Upper Extremity Venography;  Surgeon: Conrad Colona, MD;  Location: [place] CV LAB;  Service: Cardiovascular;  Laterality: Bilateral;    Family History  Problem Relation Age of Onset  . Diabetes Mother   . Hypertension Mother   . Diabetes Father   . Hypertension Father     Social History:  reports that he has never smoked. He has never used smokeless tobacco. He reports that he drinks alcohol. He reports that he does not use drugs.  Allergies:  Allergies  Allergen Reactions  . Lisinopril Other (See Comments)    REACTION: hyperkalemia/renal deterioration    Medications:  Scheduled: . albuterol  2.5 mg Nebulization Q6H  . chlorhexidine gluconate (MEDLINE KIT)  15 mL Mouth Rinse BID  . heparin  5,000 Units Subcutaneous Q8H  . insulin aspart  0-15 Units Subcutaneous Q4H  . insulin aspart  8 Units Intravenous Once  . mouth rinse   15 mL Mouth Rinse QID  . sodium chloride flush  3 mL Intravenous Q12H    BMP Latest Ref Rng & Units 08/05/2016 08/04/2016 08/04/2016  Glucose 65 - 99 mg/dL 215(H) 301(H) 339(H)  BUN 6 - 20 mg/dL 50(H) 42(H) 40(H)  Creatinine 0.61 - 1.24 mg/dL 8.09(H) 7.48(H) 7.50(H)  Sodium 135 - 145 mmol/L 135 133(L) 133(L)  Potassium 3.5 - 5.1 mmol/L 4.8 4.3 5.8(H)  Chloride 101 - 111 mmol/L 101 99(L) 98(L)  CO2 22 - 32 mmol/L 19(L) 21(L) 25  Calcium 8.9 - 10.3 mg/dL 8.2(L) 8.0(L) 8.0(L)   CBC Latest Ref Rng & Units 08/05/2016 08/04/2016 07/23/2016  WBC 4.0 - 10.5 K/uL 11.3(H) 6.2 -  Hemoglobin 13.0 - 17.0 g/dL 10.0(L) 10.5(L) 11.9(L)  Hematocrit 39.0 - 52.0 % 30.2(L) 32.5(L) 35.0(L)  Platelets 150 - 400 K/uL 133(L) 114(L) -     Dg Chest Port 1 View  Result Date: 08/05/2016 CLINICAL DATA:  Endotracheal tube placement.  Initial encounter. EXAM: PORTABLE CHEST 1 VIEW COMPARISON:  Chest radiograph performed 08/04/2016 FINDINGS: The patient's endotracheal tube is seen ending 4 cm above the carina. A right-sided dual-lumen catheter is noted ending about the cavoatrial junction. Left basilar airspace opacity is mildly worsened and raises concern for pneumonia. No pleural effusion or pneumothorax is seen. The cardiomediastinal silhouette is borderline normal in size. No acute osseous abnormalities are seen. IMPRESSION: 1. Endotracheal tube seen ending 4 cm above the carina. 2. Left basilar airspace opacity is mildly worsened and raises concern for pneumonia. Electronically Signed   By: Garald Balding M.D.   On: 08/05/2016 01:16    Review of Systems  Unable to perform ROS: Intubated   Blood pressure 130/69, pulse 75, temperature 97.9 F (36.6 C), temperature source Oral, resp. rate 15, height '5\' 7"'  (1.702 m), weight 71.4 kg (157 lb 6.5 oz), SpO2 100 %. Physical Exam  Nursing note and vitals reviewed. Constitutional: He appears well-developed and well-nourished.  HENT:  Head: Atraumatic.  Intubated and  sedated, with some facial swelling  Neck: JVD present. No tracheal deviation present. No thyromegaly present.  Cardiovascular: Normal rate, regular rhythm and normal heart sounds.   Respiratory: Effort normal and breath sounds normal. He has no wheezes. He has no rales.  GI: Soft. Bowel sounds are normal. There is no tenderness. There is no rebound.  Musculoskeletal: He exhibits edema.  2+ bilateral upper extremity edema with prominence of superficial veins of the chest. No lower extremity edema. Ligated left upper arm AVG.  Skin: Skin is warm and dry. No rash noted.    Assessment/Plan: 1. 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 2. End-stage renal disease: Hemodialysis to be undertaken today here in the ICU. Attempt ultrafiltration to try and unload volume. Difficult predicament for future permanent dialysis access and may resort to lower extremities. Right IJ TDC in situ. 3. Anemia: Hemoglobin 10.0-restart ESA and monitor for overt losses. 4. Hypertension: Blood pressure marginally elevated-likely compounded by central vein stenosis/venous hypertension-monitor with  ultrafiltration and hemodialysis. 5. Metabolic bone disease: Nothing by mouth while intubated, will reconcile medications for binders/vitamin D receptor analog as information becomes available.  Kore Madlock K. 08/05/2016, 12:20 PM

## 2016-08-06 ENCOUNTER — Inpatient Hospital Stay (HOSPITAL_COMMUNITY): Payer: Medicare Other

## 2016-08-06 DIAGNOSIS — I871 Compression of vein: Principal | ICD-10-CM

## 2016-08-06 DIAGNOSIS — N186 End stage renal disease: Secondary | ICD-10-CM

## 2016-08-06 LAB — CBC
HCT: 37.4 % — ABNORMAL LOW (ref 39.0–52.0)
HEMOGLOBIN: 12.4 g/dL — AB (ref 13.0–17.0)
MCH: 30.5 pg (ref 26.0–34.0)
MCHC: 33.2 g/dL (ref 30.0–36.0)
MCV: 92.1 fL (ref 78.0–100.0)
Platelets: DECREASED 10*3/uL (ref 150–400)
RBC: 4.06 MIL/uL — ABNORMAL LOW (ref 4.22–5.81)
RDW: 16.2 % — ABNORMAL HIGH (ref 11.5–15.5)
WBC: 13.1 10*3/uL — ABNORMAL HIGH (ref 4.0–10.5)

## 2016-08-06 LAB — BASIC METABOLIC PANEL
Anion gap: 19 — ABNORMAL HIGH (ref 5–15)
BUN: 40 mg/dL — ABNORMAL HIGH (ref 6–20)
CHLORIDE: 98 mmol/L — AB (ref 101–111)
CO2: 19 mmol/L — ABNORMAL LOW (ref 22–32)
CREATININE: 6.96 mg/dL — AB (ref 0.61–1.24)
Calcium: 8.3 mg/dL — ABNORMAL LOW (ref 8.9–10.3)
GFR calc non Af Amer: 7 mL/min — ABNORMAL LOW (ref >60)
GFR, EST AFRICAN AMERICAN: 8 mL/min — AB (ref >60)
Glucose, Bld: 170 mg/dL — ABNORMAL HIGH (ref 65–99)
POTASSIUM: 4.7 mmol/L (ref 3.5–5.1)
SODIUM: 136 mmol/L (ref 135–145)

## 2016-08-06 LAB — GLUCOSE, CAPILLARY
GLUCOSE-CAPILLARY: 80 mg/dL (ref 65–99)
GLUCOSE-CAPILLARY: 163 mg/dL — AB (ref 65–99)
GLUCOSE-CAPILLARY: 120 mg/dL — AB (ref 65–99)
Glucose-Capillary: 109 mg/dL — ABNORMAL HIGH (ref 65–99)
Glucose-Capillary: 114 mg/dL — ABNORMAL HIGH (ref 65–99)
Glucose-Capillary: 166 mg/dL — ABNORMAL HIGH (ref 65–99)
Glucose-Capillary: 310 mg/dL — ABNORMAL HIGH (ref 65–99)

## 2016-08-06 LAB — MAGNESIUM: MAGNESIUM: 1.8 mg/dL (ref 1.7–2.4)

## 2016-08-06 LAB — PHOSPHORUS: PHOSPHORUS: 5.7 mg/dL — AB (ref 2.5–4.6)

## 2016-08-06 MED ORDER — SENNOSIDES 8.8 MG/5ML PO SYRP
5.0000 mL | ORAL_SOLUTION | Freq: Two times a day (BID) | ORAL | Status: DC
  Administered 2016-08-08 – 2016-08-10 (×2): 5 mL
  Filled 2016-08-06 (×11): qty 5

## 2016-08-06 MED ORDER — FENTANYL CITRATE (PF) 100 MCG/2ML IJ SOLN: 12.5000 ug | INTRAMUSCULAR | Status: DC | PRN

## 2016-08-06 MED ORDER — DOCUSATE SODIUM 50 MG/5ML PO LIQD
100.0000 mg | Freq: Two times a day (BID) | ORAL | Status: DC
  Administered 2016-08-07 – 2016-08-10 (×3): 100 mg
  Filled 2016-08-06 (×10): qty 10

## 2016-08-06 NOTE — Procedures (Signed)
Extubation Procedure Note  Patient Details:   Name: BURRELL WIES DOB: April 17, 1935 MRN: [id number]   Airway Documentation:     Evaluation  O2 sats: stable throughout. Complications: no apparent complications. Patient did tolerate procedure well. Bilateral Breath Sounds: Clear, Diminished   Patient extubated to 4 L [place] without incident. No distress noted. Patient able to speak post extubation. No stridor noted.   [place] M Dequincy Born 08/06/2016, 9:50 AM

## 2016-08-06 NOTE — Progress Notes (Signed)
eLink Physician-Brief Progress Note Patient Name: Charles Vasquez DOB: 11/03/1935 MRN: [id number]   Date of Service  08/06/2016  HPI/Events of Note  Camera check on patient postextubation. Resting comfortably. Currently on nasal cannula oxygen with respiratory rate 25 breaths per minute. Minimally increased work of breathing.   eICU Interventions  Continuing close ICU monitoring postextubation.      Intervention Category Major Interventions: Respiratory failure - evaluation and management  Lawanda Cousins 08/06/2016, 7:54 PM

## 2016-08-06 NOTE — Progress Notes (Signed)
Pt has been sitting up in chair this afternoon for several hours. Pt ambulated with RN around nurses station with a rolling walker for stability (ambulated about 200 feet). Activity was well tolerated and pt was returned back to bed with call bell in reach.

## 2016-08-06 NOTE — Progress Notes (Addendum)
   Daily Progress Note   Assessment/Planning:   POD #14 s/p LUA AVG, POD #2 s/p ligation of LUA AVG   Preop fistulogram demonstrates patent L central venous system so unusual that the patient presented with essentially SVC syndrome  In reviewing my Op Note, a pulsatile character in the AVG was found at the end of the case, but I felt this was due to bicep muscle compression of the graft.  In retrospect, this may reflect some some left side central venous stenosis.  Agree that ligation of LUA AVG was the most expeditious intervention  Left arm will remain swollen for the next 2-4 weeks easily   Keep Left arm elevated on pillows and wrap left arm with ACE wrap to get some compression on the left arm  If pt develops any drainage from any incision, would not be surprise.  Would apply dry dressings along with compression.  Will periodically check on patient   Subjective  - 2 Days Post-Op   Some what dazed, no complaints   Objective   Vitals:   08/06/16 0847 08/06/16 0900 08/06/16 0950 08/06/16 1000  BP: 132/74 137/83  117/62  Pulse: 79 97  90  Resp: 17 15  (!) 24  Temp:      TempSrc:      SpO2: 100% 100% 98% 98%  Weight:      Height:         Intake/Output Summary (Last 24 hours) at 08/06/16 1010 Last data filed at 08/06/16 1000  Gross per 24 hour  Intake           592.42 ml  Output             1600 ml  Net         -1007.58 ml    VASC All incisions c/d/i, 3+ edema in LUA   NEURO Sensation and motor intact    Laboratory   CBC CBC Latest Ref Rng & Units 08/06/2016 08/05/2016 08/04/2016  WBC 4.0 - 10.5 K/uL 13.1(H) 11.3(H) 6.2  Hemoglobin 13.0 - 17.0 g/dL 12.4(L) 10.0(L) 10.5(L)  Hematocrit 39.0 - 52.0 % 37.4(L) 30.2(L) 32.5(L)  Platelets 150 - 400 K/uL PLATELET CLUMPS NOTED ON SMEAR, COUNT APPEARS DECREASED 133(L) 114(L)    BMET    Component Value Date/Time   NA 136 08/06/2016 0552   NA 138 10/15/2011 1339   K 4.7 08/06/2016 0552   K 4.5 11/28/2011 1340   CL 98 (L) 08/06/2016 0552   CL 104 10/15/2011 1339   CO2 19 (L) 08/06/2016 0552   CO2 24 10/15/2011 1339   GLUCOSE 170 (H) 08/06/2016 0552   GLUCOSE 121 (H) 10/15/2011 1339   BUN 40 (H) 08/06/2016 0552   BUN 47 (H) 10/15/2011 1339   CREATININE 6.96 (H) 08/06/2016 0552   CREATININE 3.88 (H) 10/15/2011 1339   CALCIUM 8.3 (L) 08/06/2016 0552   CALCIUM 8.1 (L) 10/15/2011 1339   GFRNONAA 7 (L) 08/06/2016 0552   GFRNONAA 14 (L) 10/15/2011 1339   GFRAA 8 (L) 08/06/2016 0552   GFRAA 17 (L) 10/15/2011 1339     [name], MD, FACS [facility] Office: [phone] Pager: [phone]  08/06/2016, 10:10 AM

## 2016-08-06 NOTE — Progress Notes (Signed)
PULMONARY / CRITICAL CARE MEDICINE   Name: Charles Vasquez MRN: [id number] DOB: 05/23/1935    ADMISSION DATE:  08/04/2016 CONSULTATION DATE:  08/04/2016  REFERRING MD :  Dr. Melynda Ripple  CHIEF COMPLAINT:  Facial swelling  INITIAL PRESENTATION:  81 y.o. male with medical history significant for end-stage renal disease on dialysis Monday Wednesday and Fridays, hypertension, diabetes, MI, stroke presents to ED with several days of progressive facial and bilateral upper extremity L>R edema after recent left sided fistula creation.  He also complained of tightness in the face, dysphagia and a choking sensation.  SIGNIFICANT EVENTS: 5/27 Lt fistula ligation 5/28 Left on vent after procedure  SUBJECTIVE:  No acute events Weaning Sedation off Has cuff leak Had sudden change in mental status yesterday in dialysis  VITAL SIGNS: Temp:  [97.2 F (36.2 C)-97.9 F (36.6 C)] 97.5 F (36.4 C) (05/29 0803) Pulse Rate:  [72-96] 79 (05/29 0847) Resp:  [0-28] 17 (05/29 0847) BP: (91-170)/(42-131) 132/74 (05/29 0847) SpO2:  [88 %-100 %] 100 % (05/29 0847) FiO2 (%):  [40 %] 40 % (05/29 0847) Weight:  [69.6 kg (153 lb 7 oz)-72.3 kg (159 lb 6.3 oz)] 69.9 kg (154 lb 1.6 oz) (05/29 0323)  VENTILATOR SETTINGS: Vent Mode: PSV;CPAP FiO2 (%):  [40 %] 40 % Set Rate:  [15 bmp] 15 bmp Vt Set:  [500 mL] 500 mL PEEP:  [5 cmH20] 5 cmH20 Pressure Support:  [5 cmH20] 5 cmH20 Plateau Pressure:  [13 cmH20-16 cmH20] 14 cmH20   INTAKE / OUTPUT: I/O last 3 completed shifts: In: 1485 [I.V.:905; Other:80; IV Piggyback:500] Out: 1605 [Urine:100; Other:1500; Blood:5]   PHYSICAL EXAMINATION:  General:  Resting comfortably in bed HENT: NCAT ETT in place, OP clear, some facial swelling PULM: CTA B, vent supported breathing CV: RRR, no mgr GI: BS+, soft, nontender MSK: normal bulk and tone Derm: some arm swelling Neuro: awake, alert, no distress, MAEW    LABS:  CBC  Recent Labs Lab 08/04/16 1620  08/05/16 0808 08/06/16 0552  WBC 6.2 11.3* 13.1*  HGB 10.5* 10.0* 12.4*  HCT 32.5* 30.2* 37.4*  PLT 114* 133* PLATELET CLUMPS NOTED ON SMEAR, COUNT APPEARS DECREASED   Coag's  Recent Labs Lab 08/04/16 1620  INR 0.93   BMET  Recent Labs Lab 08/04/16 2155 08/05/16 0808 08/06/16 0552  NA 133* 135 136  K 4.3 4.8 4.7  CL 99* 101 98*  CO2 21* 19* 19*  BUN 42* 50* 40*  CREATININE 7.48* 8.09* 6.96*  GLUCOSE 301* 215* 170*   Electrolytes  Recent Labs Lab 08/04/16 2155 08/05/16 0808 08/06/16 0552  CALCIUM 8.0* 8.2* 8.3*  MG  --   --  1.8  PHOS  --   --  5.7*   Sepsis Markers No results for input(s): LATICACIDVEN, PROCALCITON, O2SATVEN in the last 168 hours.   ABG  Recent Labs Lab 08/05/16 0020  PHART 7.367  PCO2ART 37.0  PO2ART 286*   Liver Enzymes  Recent Labs Lab 08/04/16 1620  AST 52*  ALT 33  ALKPHOS 114  BILITOT 0.7  ALBUMIN 3.3*   Cardiac Enzymes No results for input(s): TROPONINI, PROBNP in the last 168 hours.   Glucose  Recent Labs Lab 08/05/16 1546 08/05/16 1728 08/05/16 2010 08/05/16 2337 08/06/16 0402 08/06/16 0802  GLUCAP 112* 116* 133* 86 80 109*    Imaging Dg Chest Port 1 View  Result Date: 08/06/2016 CLINICAL DATA:  Respiratory failure. EXAM: PORTABLE CHEST 1 VIEW COMPARISON:  08/05/2016 FINDINGS: Endotracheal tube terminates 4 cm  above the carina. Right jugular catheter terminates over the lower SVC/cavoatrial junction. The cardiomediastinal silhouette is within normal limits. Left basilar airspace opacities have mildly decreased. There is likely minimal atelectasis in the right lung base. No sizable pleural effusion or pneumothorax is identified. IMPRESSION: Mildly improved left base aeration. Electronically Signed   By: Sebastian Ache M.D.   On: 08/06/2016 07:30    SUMMARY: 81 yo male with acute facial swelling after AV fistula procedure requiring intubation.  ASSESSMENT / PLAN:  Acute respiratory failure due to facial  swelling> resolved Plan: Extubate Monitor airway in ICU post extubation  Acute facial swelling, presumably due to venous hypertension post vascular procedure, improving Plan: Continue to monitor in ICU Per vascular  Cellulitis? Doubt Plan: Stop antibiotics  Constipation: Plan: Colace Senna  ESRD Plan: Per renal  My cc time 30 minutes  Heber Vinton, MD [facility] Pager: [phone] Cell: [phone] After 3pm or if no response, call [phone]

## 2016-08-06 NOTE — Progress Notes (Signed)
Patient ID: Charles Vasquez, male   DOB: May 20, 1935, 81 y.o.   MRN: [id number]  [facility] Progress Note   Assessment/ Plan:   1. 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  2. End-stage renal disease: Hemodialysis attempted yesterday however, discontinued prematurely because of changes in the patient's responsiveness-mental status now appearing improved. Chest x-ray reviewed-no clear evidence of pulmonary edema and plan to refer next dialysis treatment tomorrow unless felt by critical care to assist with his extubation/retropharyngeal edema. 3. Anemia: Hemoglobin level much higher today-12.4, continue to monitor for indications to restart ESA.. 4. Hypertension: Blood pressure within acceptable range-indication of possible intradialytic hypotension noted that might have contributed to his changes in mentation. 5. Metabolic bone disease: Awaiting records from his dialysis unit regarding VDRA dosing-currently nothing by mouth, not on binders.  Subjective:   Events from overnight noted-needed to discontinue dialysis because of altered LOC. Some indication of relative hypotension noted.    Objective:   BP 132/74 (BP Location: Left Arm)   Pulse 83   Temp 97.5 F (36.4 C) (Oral)   Resp 15   Ht '5\' 7"'  (1.702 m)   Wt 69.9 kg (154 lb 1.6 oz)   SpO2 99%   BMI 24.14 kg/m   Physical Exam: MPN:[id number], awake, responsive to questions. CVS: Pulse regular rhythm, normal rate, S1 and S2 normal Resp: Clear to auscultation, no rales or rhonchi Abd: Soft, obese, nontender Ext: No lower extremity edema, 2-3+ upper extremity edema bilaterally  Labs: BMET  Recent Labs Lab 08/04/16 1620 08/04/16 2155 08/05/16 0808 08/06/16 0552  NA 133* 133* 135 136  K 5.8* 4.3 4.8 4.7  CL 98* 99* 101 98*  CO2 25 21* 19* 19*  GLUCOSE 339* 301*  215* 170*  BUN 40* 42* 50* 40*  CREATININE 7.50* 7.48* 8.09* 6.96*  CALCIUM 8.0* 8.0* 8.2* 8.3*  PHOS  --   --   --  5.7*   CBC  Recent Labs Lab 08/04/16 1620 08/05/16 0808 08/06/16 0552  WBC 6.2 11.3* 13.1*  HGB 10.5* 10.0* 12.4*  HCT 32.5* 30.2* 37.4*  MCV 93.1 91.0 92.1  PLT 114* 133* PLATELET CLUMPS NOTED ON SMEAR, COUNT APPEARS DECREASED   Medications:    . albuterol  2.5 mg Nebulization Q6H  . chlorhexidine gluconate (MEDLINE KIT)  15 mL Mouth Rinse BID  . heparin  5,000 Units Subcutaneous Q8H  . insulin aspart  0-15 Units Subcutaneous Q4H  . insulin aspart  8 Units Intravenous Once  . mouth rinse  15 mL Mouth Rinse QID  . sodium chloride flush  3 mL Intravenous Q12H   Elmarie Shiley, MD 08/06/2016, 8:28 AM

## 2016-08-07 DIAGNOSIS — R220 Localized swelling, mass and lump, head: Secondary | ICD-10-CM

## 2016-08-07 LAB — BASIC METABOLIC PANEL
ANION GAP: 14 (ref 5–15)
BUN: 46 mg/dL — ABNORMAL HIGH (ref 6–20)
CHLORIDE: 101 mmol/L (ref 101–111)
CO2: 22 mmol/L (ref 22–32)
Calcium: 7.9 mg/dL — ABNORMAL LOW (ref 8.9–10.3)
Creatinine, Ser: 8.51 mg/dL — ABNORMAL HIGH (ref 0.61–1.24)
GFR calc Af Amer: 6 mL/min — ABNORMAL LOW (ref >60)
GFR calc non Af Amer: 5 mL/min — ABNORMAL LOW (ref >60)
GLUCOSE: 129 mg/dL — AB (ref 65–99)
POTASSIUM: 4.2 mmol/L (ref 3.5–5.1)
Sodium: 137 mmol/L (ref 135–145)

## 2016-08-07 LAB — CBC WITH DIFFERENTIAL/PLATELET
Basophils Absolute: 0 10*3/uL (ref 0.0–0.1)
Basophils Relative: 0 %
Eosinophils Absolute: 0 10*3/uL (ref 0.0–0.7)
Eosinophils Relative: 0 %
HEMATOCRIT: 31.1 % — AB (ref 39.0–52.0)
Hemoglobin: 10.3 g/dL — ABNORMAL LOW (ref 13.0–17.0)
LYMPHS ABS: 0.7 10*3/uL (ref 0.7–4.0)
LYMPHS PCT: 7 %
MCH: 30 pg (ref 26.0–34.0)
MCHC: 33.1 g/dL (ref 30.0–36.0)
MCV: 90.7 fL (ref 78.0–100.0)
MONO ABS: 0.8 10*3/uL (ref 0.1–1.0)
MONOS PCT: 7 %
NEUTROS ABS: 9.3 10*3/uL — AB (ref 1.7–7.7)
Neutrophils Relative %: 86 %
Platelets: 127 10*3/uL — ABNORMAL LOW (ref 150–400)
RBC: 3.43 MIL/uL — ABNORMAL LOW (ref 4.22–5.81)
RDW: 16.1 % — AB (ref 11.5–15.5)
WBC: 10.8 10*3/uL — ABNORMAL HIGH (ref 4.0–10.5)

## 2016-08-07 LAB — GLUCOSE, CAPILLARY
GLUCOSE-CAPILLARY: 72 mg/dL (ref 65–99)
GLUCOSE-CAPILLARY: 92 mg/dL (ref 65–99)
GLUCOSE-CAPILLARY: 172 mg/dL — AB (ref 65–99)
Glucose-Capillary: 202 mg/dL — ABNORMAL HIGH (ref 65–99)
Glucose-Capillary: 77 mg/dL (ref 65–99)
Glucose-Capillary: 92 mg/dL (ref 65–99)

## 2016-08-07 LAB — CULTURE, RESPIRATORY W GRAM STAIN: Culture: NORMAL

## 2016-08-07 LAB — CULTURE, RESPIRATORY

## 2016-08-07 MED ORDER — HEPARIN SODIUM (PORCINE) 1000 UNIT/ML DIALYSIS
40.0000 [IU]/kg | INTRAMUSCULAR | Status: DC | PRN
  Administered 2016-08-07: 2800 [IU] via INTRAVENOUS_CENTRAL

## 2016-08-07 MED ORDER — RESOURCE THICKENUP CLEAR PO POWD
Freq: Once | ORAL | Status: DC
  Filled 2016-08-07 (×2): qty 125

## 2016-08-07 NOTE — Evaluation (Signed)
Clinical/Bedside Swallow Evaluation Patient Details  Name: Charles Vasquez MRN: [id number] Date of Birth: [birth year]  Today's Date: 08/07/2016 Time: SLP Start Time (ACUTE ONLY): 1105 SLP Stop Time (ACUTE ONLY): 1126 SLP Time Calculation (min) (ACUTE ONLY): 21 min  Past Medical History:  Past Medical History:  Diagnosis Date  . Chronic kidney disease    dialysis, [name]- M,W,F  . Diabetes mellitus without complication (HCC)    lantus daily and humalog sliding scale, type2  . Dizziness   . History of blood transfusion   . Hypertension    takes Amlodipine every other day  . Myocardial infarction (HCC)   . Pneumonia    last yr  . Stroke [facility])    Past Surgical History:  Past Surgical History:  Procedure Laterality Date  . ARTERIOVENOUS GRAFT PLACEMENT    . AV FISTULA PLACEMENT  03/31/2012   Procedure: ARTERIOVENOUS (AV) FISTULA CREATION;  Surgeon: [name], MD;  Location: [facility] OR;  Service: Vascular;  Laterality: Right;  First stage Brachial vein transposition  . AV FISTULA PLACEMENT Right 08/25/2012   Procedure: INSERTION OF ARTERIOVENOUS (AV) GORE-TEX GRAFT ARM;  Surgeon: [name], MD;  Location: [facility] OR;  Service: Vascular;  Laterality: Right;  . AV FISTULA PLACEMENT Left 07/23/2016   Procedure: INSERTION OF ARTERIOVENOUS (AV) GORE-TEX GRAFT LEFT UPPER ARM;  Surgeon: [name], MD;  Location: [facility] OR;  Service: Vascular;  Laterality: Left;  . EYE SURGERY Bilateral    cataracts  . INSERTION OF DIALYSIS CATHETER Left 05/05/2012   Procedure: INSERTION OF DIALYSIS CATHETER;  Surgeon: Chuck Hint, MD;  Location: [facility] OR;  Service: Vascular;  Laterality: Left;  . IR FLUORO GUIDE CV LINE RIGHT  06/10/2016  . IR GENERIC HISTORICAL  06/07/2016   IR US GUIDE VASC ACCESS RIGHT 06/07/2016 Oley Balm, MD [facility]-INTERV RAD  . IR THROMBECTOMY AV FISTULA W/THROMBOLYSIS/PTA INC/SHUNT/IMG RIGHT Right 06/07/2016  . IR US GUIDE VASC ACCESS RIGHT  06/10/2016  . LIGATION ARTERIOVENOUS GORTEX GRAFT  Left 08/04/2016   Procedure: LIGATION ARTERIOVENOUS GORTEX GRAFT;  Surgeon: Larina Earthly, MD;  Location: [facility] OR;  Service: Vascular;  Laterality: Left;  . LIGATION OF ARTERIOVENOUS  FISTULA Right 08/25/2012   Procedure: LIGATION OF ARTERIOVENOUS  FISTULA;  Surgeon: [name], MD;  Location: [facility] OR;  Service: Vascular;  Laterality: Right;  Ligation of right brachial vein transposition  . REMOVAL OF A DIALYSIS CATHETER Right 05/05/2012   Procedure: REMOVAL OF A DIALYSIS CATHETER;  Surgeon: Chuck Hint, MD;  Location: [facility] OR;  Service: Vascular;  Laterality: Right;  . TOE AMPUTATION    . UPPER EXTREMITY VENOGRAPHY Bilateral 07/18/2016   Procedure: Bilateral Upper Extremity Venography;  Surgeon: [name], MD;  Location: [facility] INVASIVE CV LAB;  Service: Cardiovascular;  Laterality: Bilateral;   HPI:  81 y.o.malewith medical history significant for end-stage renal disease on dialysis Monday Wednesday and Fridays, hypertension, diabetes, MI, stroke presents to ED with several days of progressive facial and bilateral upper extremity L>R edema after recent left sided fistula creation. He also complained of tightness in the face, dysphagia and a choking sensation. ETT 5/27 for left fistual ligation; remained on vent due to facial swelling; extubated 5/29.    Assessment / Plan / Recommendation Clinical Impression  Pt presents with a likely acute reversible dysphagia s/p two-day intubation, facial/retropharyngeal edema - phonation is hoarse/wet at baseline (different than baseline per friend at bedside); consumption of water leads to inconsistent coughing, multiple subswallows per each  bolus, concerning for residue and potential aspiration.  Pt consumed purees, honey-thick liquids with no overt concerns for aspiration.  Recommend initiating conservative diet of dysphagia 1, honey-thick liquids.  SLP will f/u next date for clinical improvements; if no change, will proceed with FEES.  D/w pt, RN, who  agree with plan.  SLP Visit Diagnosis: Dysphagia, unspecified (R13.10)    Aspiration Risk  Moderate aspiration risk    Diet Recommendation   dysphagia 1, honey thick liquids  Medication Administration: Whole meds with puree    Other  Recommendations Oral Care Recommendations: Oral care BID Other Recommendations: Order thickener from pharmacy   Follow up Recommendations        Frequency and Duration min 2x/week  2 weeks       Prognosis Prognosis for Safe Diet Advancement: Good      Swallow Study   General Date of Onset: 08/04/16 HPI: 81 y.o.malewith medical history significant for end-stage renal disease on dialysis Monday Wednesday and Fridays, hypertension, diabetes, MI, stroke presents to ED with several days of progressive facial and bilateral upper extremity L>R edema after recent left sided fistula creation. He also complained of tightness in the face, dysphagia and a choking sensation. ETT 5/27 for left fistual ligation; remained on vent due to facial swelling; extubated 5/29.  Type of Study: Bedside Swallow Evaluation Previous Swallow Assessment: no Diet Prior to this Study: NPO Temperature Spikes Noted: No Respiratory Status: Nasal cannula History of Recent Intubation: Yes Length of Intubations (days): 2 days Date extubated: 08/06/16 Behavior/Cognition: Alert;Cooperative;Pleasant mood Oral Cavity Assessment: Edema Oral Care Completed by SLP: No Oral Cavity - Dentition: Edentulous Vision: Functional for self-feeding Self-Feeding Abilities: Able to feed self Patient Positioning: Upright in bed Baseline Vocal Quality: Wet;Hoarse Volitional Cough: Strong Volitional Swallow: Able to elicit    Oral/Motor/Sensory Function Overall Oral Motor/Sensory Function: Within functional limits   Ice Chips Ice chips: Within functional limits   Thin Liquid Thin Liquid: Impaired Presentation: Cup;Self Fed Pharyngeal  Phase Impairments: Multiple swallows;Cough - Immediate     Nectar Thick Nectar Thick Liquid: Not tested   Honey Thick Honey Thick Liquid: Impaired Pharyngeal Phase Impairments: Multiple swallows   Puree Puree: Within functional limits   Solid   GO   Solid: Not tested        Blenda Mounts Laurice 08/07/2016,11:30 AM  Marchelle Folks L. Samson Frederic, [place] CCC/SLP Pager [phone]

## 2016-08-07 NOTE — Progress Notes (Signed)
PULMONARY / CRITICAL CARE MEDICINE   Name: Charles Vasquez MRN: [id number] DOB: Sep 25, 1935    ADMISSION DATE:  08/04/2016 CONSULTATION DATE:  08/04/2016  REFERRING MD :  Dr. Melynda Ripple  CHIEF COMPLAINT:  Facial swelling  INITIAL PRESENTATION:  81 y.o. male with medical history significant for end-stage renal disease on dialysis Monday Wednesday and Fridays, hypertension, diabetes, MI, stroke presents to ED with several days of progressive facial and bilateral upper extremity L>R edema after recent left sided fistula creation.  He also complained of tightness in the face, dysphagia and a choking sensation.   SUBJECTIVE:  Extubated 5/30 No events overnight until RN attempted to give am meds.  Did not tolerate sips/meds-> increased coughing afterwards.   VITAL SIGNS: Temp:  [98.1 F (36.7 C)-99.1 F (37.3 C)] 98.1 F (36.7 C) (05/30 0733) Pulse Rate:  [70-104] 78 (05/30 0800) Resp:  [0-32] 22 (05/30 0800) BP: (109-163)/(35-136) 109/50 (05/30 0800) SpO2:  [89 %-100 %] 97 % (05/30 0800) Weight:  [151 lb 14.4 oz (68.9 kg)] 151 lb 14.4 oz (68.9 kg) (05/30 0322)  VENTILATOR SETTINGS:     INTAKE / OUTPUT: I/O last 3 completed shifts: In: 626.2 [I.V.:376.2; IV Piggyback:250] Out: 125 [Urine:125]   PHYSICAL EXAMINATION: General:  Adult male lying in bed in NAD HEENT: NCAT, MM pink/moist, able to visualize uvula, no stridor, voice raspy Neuro: Alert, oriented, MAE CV: s1s2 rrr PULM: even/non-labored, lungs bilaterally rhonchi and diminished in bases, increased coughing GI: soft, non-tender, hypoBS  Extremities: warm/dry, 2-3+ bilateral upper extremity edema  Skin: no rashes or lesions  LABS:  CBC  Recent Labs Lab 08/05/16 0808 08/06/16 0552 08/07/16 0212  WBC 11.3* 13.1* 10.8*  HGB 10.0* 12.4* 10.3*  HCT 30.2* 37.4* 31.1*  PLT 133* PLATELET CLUMPS NOTED ON SMEAR, COUNT APPEARS DECREASED 127*   Coag's  Recent Labs Lab 08/04/16 1620  INR 0.93   BMET  Recent  Labs Lab 08/05/16 0808 08/06/16 0552 08/07/16 0212  NA 135 136 137  K 4.8 4.7 4.2  CL 101 98* 101  CO2 19* 19* 22  BUN 50* 40* 46*  CREATININE 8.09* 6.96* 8.51*  GLUCOSE 215* 170* 129*   Electrolytes  Recent Labs Lab 08/05/16 0808 08/06/16 0552 08/07/16 0212  CALCIUM 8.2* 8.3* 7.9*  MG  --  1.8  --   PHOS  --  5.7*  --    Sepsis Markers No results for input(s): LATICACIDVEN, PROCALCITON, O2SATVEN in the last 168 hours.   ABG  Recent Labs Lab 08/05/16 0020  PHART 7.367  PCO2ART 37.0  PO2ART 286*   Liver Enzymes  Recent Labs Lab 08/04/16 1620  AST 52*  ALT 33  ALKPHOS 114  BILITOT 0.7  ALBUMIN 3.3*   Cardiac Enzymes No results for input(s): TROPONINI, PROBNP in the last 168 hours.   Glucose  Recent Labs Lab 08/06/16 1154 08/06/16 1616 08/06/16 2000 08/06/16 2350 08/07/16 0402 08/07/16 0732  GLUCAP 163* 120* 114* 166* 77 72    Imaging No results found.   CULTURES: BCx2 5/28 > Tracheal asp 5/27 >  MRSA PCR 5/27 > neg  LINES: ETT 5/27 > 5/29  SIGNIFICANT EVENTS: 5/27 Lt fistula ligation 5/28 Left on vent after procedure 5/29 Extubated  SUMMARY: 81 yo male with acute facial swelling after AV fistula procedure requiring intubation.  ASSESSMENT / PLAN:  Acute respiratory failure due to facial swelling> resolved r/o dysphagia  Plan: Aggressive pulmonary hygiene with IS/mobilize Wean FiO2 for sats > 92% Continue BD SLP eval  Acute facial swelling, presumably due to venous hypertension post vascular procedure, improving Plan: Ongoing monitor HD planned for today Per vascular  Cellulitis? Doubt Plan: Trend WBC/ fever curve  Constipation: Plan: Colace Senna  ESRD Plan: Per renal HD later in ICU today   My cc time 35 mins  Global: plan for HD today in ICU to monitor hemodynamics/ mental status given decreased LOC/ possible hypotension and hopeful to transfer out of ICU 5/31.    Posey Boyer, AGACNP-BC   Pulmonary & Critical Care Pgr: [phone] or if no answer [phone] 08/07/2016, 9:47 AM

## 2016-08-07 NOTE — Progress Notes (Signed)
[facility] PCCM Attending  This morning, some difficulty swallowing pills, coughing more To have HD today No stridor Maintaining his secretions  Vitals:   08/07/16 0743 08/07/16 0800 08/07/16 0900 08/07/16 0924  BP:  (!) 109/50 (!) 122/46   Pulse:  78 70   Resp: (!) 22 (!) 22 (!) 21   Temp:      TempSrc:      SpO2: 99% 97% 91% 93%  Weight:      Height:       On exam  General:  Resting comfortably in bed HENT: NCAT OP clear PULM: CTA B, normal effort, no stridor CV: RRR, no mgr GI: BS+, soft, nontender MSK: normal bulk and tone Neuro: awake, alert, no distress, MAEW   CBC    Component Value Date/Time   WBC 10.8 (H) 08/07/2016 0212   RBC 3.43 (L) 08/07/2016 0212   HGB 10.3 (L) 08/07/2016 0212   HGB 11.8 (L) 11/29/2011 0614   HCT 31.1 (L) 08/07/2016 0212   HCT 34.9 (L) 11/29/2011 0614   PLT 127 (L) 08/07/2016 0212   PLT 97 (L) 11/29/2011 0614   MCV 90.7 08/07/2016 0212   MCV 92 11/29/2011 0614   MCH 30.0 08/07/2016 0212   MCHC 33.1 08/07/2016 0212   RDW 16.1 (H) 08/07/2016 0212   RDW 13.8 11/29/2011 0614   LYMPHSABS 0.7 08/07/2016 0212   LYMPHSABS 0.9 (L) 11/29/2011 0614   MONOABS 0.8 08/07/2016 0212   MONOABS 0.3 11/29/2011 0614   EOSABS 0.0 08/07/2016 0212   EOSABS 0.0 11/29/2011 0614   BASOSABS 0.0 08/07/2016 0212   BASOSABS 0.0 11/29/2011 0614    Facial swelling> SVC syndrome type picture, stable, continue to monitor closely in ICU given cough Dysphagia> SLP eval today ESRD> plan dialysis in ICU today, s/p AV fistula ligation  Heber Humphrey, MD [place] P[facility]M Pager: [phone] Cell: [phone] After 3pm or if no response, call [phone]

## 2016-08-07 NOTE — Care Management Note (Signed)
Case Management Note  Patient Details  Name: Charles Vasquez MRN: [id number] Date of Birth: 1935/09/17  Subjective/Objective:  Pt with end-stage renal disease on dialysis Monday Wednesday and Fridays, hypertension, diabetes, MI, stroke presents to ED with several days of progressive facial and bilateral upper extremity L>R edema after recent left sided fistula creation - required intubation             Action/Plan:  PTA from home with wife, ESRD on outpt HD   Expected Discharge Date:                  Expected Discharge Plan:     In-House Referral:     Discharge planning Services  CM Consult  Post Acute Care Choice:    Choice offered to:     DME Arranged:    DME Agency:     HH Arranged:    HH Agency:     Status of Service:     If discussed at [employer], dates discussed:    Additional Comments:  Cherylann Parr, RN 08/07/2016, 3:51 PM

## 2016-08-07 NOTE — Progress Notes (Signed)
Patient ID: KYAIRE RUDIGER, male   DOB: [birth year], 81 y.o.   MRN: [id number]   [facility] Progress Note   Assessment/ Plan:   1. 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  2. End-stage renal disease:  usually on a Monday/Wednesday/Friday schedule-hemodialysis today after truncated treatment on Monday for altered LOC. Because of safety reasons, we'll do dialysis in the ICU. 3. Anemia: Hemoglobin level is stable 10.3, continue to monitor for indications to restart ESA.. 4. Hypertension: Blood pressures appear to be on the "soft" side-monitor cautiously with hemodialysis to limit intradialytic blood pressure drop. 5. Metabolic bone disease: Resume vitamin D receptor analog with dialysis, resume phosphorus binders  Subjective:   Extubated last night and appears to have done well overnight.    Objective:   BP (!) 109/50   Pulse 78   Temp 98.1 F (36.7 C) (Oral)   Resp (!) 22   Ht 5\' 7"  (1.702 m)   Wt 68.9 kg (151 lb 14.4 oz)   SpO2 97%   BMI 23.79 kg/m   Physical Exam: Gen: Awake, alert, appears comfortable CVS: Pulse regular rhythm, normal rate, S1 and S2 normal Resp: Clear to auscultation, no rales or rhonchi Abd: Soft, obese, nontender Ext: No lower extremity edema, 2-3+ upper extremity edema bilaterally  Labs: BMET  Recent Labs Lab 08/04/16 1620 08/04/16 2155 08/05/16 0808 08/06/16 0552 08/07/16 0212  NA 133* 133* 135 136 137  K 5.8* 4.3 4.8 4.7 4.2  CL 98* 99* 101 98* 101  CO2 25 21* 19* 19* 22  GLUCOSE 339* 301* 215* 170* 129*  BUN 40* 42* 50* 40* 46*  CREATININE 7.50* 7.48* 8.09* 6.96* 8.51*  CALCIUM 8.0* 8.0* 8.2* 8.3* 7.9*  PHOS  --   --   --  5.7*  --    CBC  Recent Labs Lab 08/04/16 1620 08/05/16 0808 08/06/16 0552 08/07/16 0212  WBC 6.2 11.3* 13.1* 10.8*   NEUTROABS  --   --   --  9.3*  HGB 10.5* 10.0* 12.4* 10.3*  HCT 32.5* 30.2* 37.4* 31.1*  MCV 93.1 91.0 92.1 90.7  PLT 114* 133* PLATELET CLUMPS NOTED ON SMEAR, COUNT APPEARS DECREASED 127*   Medications:    . albuterol  2.5 mg Nebulization Q6H  . docusate  100 mg Per Tube BID  . heparin  5,000 Units Subcutaneous Q8H  . insulin aspart  0-15 Units Subcutaneous Q4H  . insulin aspart  8 Units Intravenous Once  . sennosides  5 mL Per Tube BID  . sodium chloride flush  3 mL Intravenous Q12H   Zetta Bills, MD 08/07/2016, 8:26 AM

## 2016-08-08 DIAGNOSIS — N182 Chronic kidney disease, stage 2 (mild): Secondary | ICD-10-CM

## 2016-08-08 LAB — GLUCOSE, CAPILLARY
GLUCOSE-CAPILLARY: 44 mg/dL — AB (ref 65–99)
GLUCOSE-CAPILLARY: 161 mg/dL — AB (ref 65–99)
GLUCOSE-CAPILLARY: 242 mg/dL — AB (ref 65–99)
Glucose-Capillary: 151 mg/dL — ABNORMAL HIGH (ref 65–99)
Glucose-Capillary: 164 mg/dL — ABNORMAL HIGH (ref 65–99)
Glucose-Capillary: 210 mg/dL — ABNORMAL HIGH (ref 65–99)
Glucose-Capillary: 246 mg/dL — ABNORMAL HIGH (ref 65–99)

## 2016-08-08 LAB — BASIC METABOLIC PANEL
Anion gap: 13 (ref 5–15)
BUN: 24 mg/dL — ABNORMAL HIGH (ref 6–20)
CHLORIDE: 97 mmol/L — AB (ref 101–111)
CO2: 23 mmol/L (ref 22–32)
CREATININE: 5.61 mg/dL — AB (ref 0.61–1.24)
Calcium: 7.9 mg/dL — ABNORMAL LOW (ref 8.9–10.3)
GFR calc non Af Amer: 9 mL/min — ABNORMAL LOW (ref >60)
GFR, EST AFRICAN AMERICAN: 10 mL/min — AB (ref >60)
Glucose, Bld: 192 mg/dL — ABNORMAL HIGH (ref 65–99)
POTASSIUM: 4.1 mmol/L (ref 3.5–5.1)
SODIUM: 133 mmol/L — AB (ref 135–145)

## 2016-08-08 LAB — CBC
HEMATOCRIT: 31.4 % — AB (ref 39.0–52.0)
Hemoglobin: 10.3 g/dL — ABNORMAL LOW (ref 13.0–17.0)
MCH: 29.9 pg (ref 26.0–34.0)
MCHC: 32.8 g/dL (ref 30.0–36.0)
MCV: 91 fL (ref 78.0–100.0)
Platelets: 120 10*3/uL — ABNORMAL LOW (ref 150–400)
RBC: 3.45 MIL/uL — AB (ref 4.22–5.81)
RDW: 15.7 % — ABNORMAL HIGH (ref 11.5–15.5)
WBC: 10 10*3/uL (ref 4.0–10.5)

## 2016-08-08 LAB — HEPATITIS B SURFACE ANTIGEN: Hepatitis B Surface Ag: NEGATIVE

## 2016-08-08 MED ORDER — ONDANSETRON HCL 4 MG/2ML IJ SOLN: 4.0000 mg | Freq: Once | INTRAMUSCULAR | Status: DC | PRN

## 2016-08-08 MED ORDER — HEPARIN SODIUM (PORCINE) 1000 UNIT/ML DIALYSIS
40.0000 [IU]/kg | INTRAMUSCULAR | Status: DC | PRN
  Administered 2016-08-09: 2700 [IU] via INTRAVENOUS_CENTRAL

## 2016-08-08 MED ORDER — HYDROMORPHONE HCL 1 MG/ML IJ SOLN
0.2500 mg | INTRAMUSCULAR | Status: DC | PRN
Start: 2016-08-08 — End: 2016-08-09

## 2016-08-08 MED ORDER — SEVELAMER CARBONATE 0.8 G PO PACK
1.6000 g | PACK | Freq: Three times a day (TID) | ORAL | Status: DC
  Administered 2016-08-08 – 2016-08-10 (×5): 1.6 g via ORAL
  Filled 2016-08-08 (×12): qty 2

## 2016-08-08 MED ORDER — MEPERIDINE HCL 25 MG/ML IJ SOLN
6.2500 mg | INTRAMUSCULAR | Status: DC | PRN
Start: 2016-08-08 — End: 2016-08-09

## 2016-08-08 MED ORDER — DEXTROSE 50 % IV SOLN
INTRAVENOUS | Status: AC
  Administered 2016-08-08: 04:00:00
  Filled 2016-08-08: qty 50

## 2016-08-08 NOTE — Progress Notes (Signed)
  Speech Language Pathology Treatment: Dysphagia  Patient Details Name: Charles Vasquez MRN: [id number] DOB: Dec 23, 1935 Today's Date: 08/08/2016 Time: 9093-1121 SLP Time Calculation (min) (ACUTE ONLY): 19 min  Assessment / Plan / Recommendation Clinical Impression  F/u diet tolerance assessment complete including diagnostic treatment to determine readiness to advance diet. Patient alert and cooperative. Baseline wet vocal quality noted clearing with moderate SLP cueing for hard cough/throat clear with expectoration of moderately thick secretions. Vocal quality then remained largely clear throughout po trials with the exception of one time wet voice suggestive of decreased airway protection, masticating solids during conversation. Vocal quality quickly cleared with spontaneous throat clear and dry swallow. Educated patient on risk of aspiration with 2 day intubation and known pharyngeal edema and importance of utilizing aspiration precautions to decrease risk. SLP will advance diet and continue to f/u closely for diet tolerance.     HPI HPI: 81 y.o.malewith medical history significant for end-stage renal disease on dialysis Monday Wednesday and Fridays, hypertension, diabetes, MI, stroke presents to ED with several days of progressive facial and bilateral upper extremity L>R edema after recent left sided fistula creation. He also complained of tightness in the face, dysphagia and a choking sensation. ETT 5/27 for left fistual ligation; remained on vent due to facial swelling; extubated 5/29.       SLP Plan  Continue with current plan of care       Recommendations  Diet recommendations: Dysphagia 3 (mechanical soft);Thin liquid Liquids provided via: Cup;Straw Medication Administration: Whole meds with puree Supervision: Patient able to self feed;Intermittent supervision to cue for compensatory strategies Compensations: Slow rate;Small sips/bites;Other (Comment) (clear throat/cough with wet  vocal quality) Postural Changes and/or Swallow Maneuvers: Seated upright 90 degrees                Oral Care Recommendations: Oral care BID Follow up Recommendations: None SLP Visit Diagnosis: Dysphagia, unspecified (R13.10) Plan: Continue with current plan of care       GO             Ferdinand Lango MA, CCC-SLP [phone]    Duey Liller Meryl 08/08/2016, 11:06 AM

## 2016-08-08 NOTE — Progress Notes (Signed)
Pt O2 sat 97% RA at this time.  Pt voice raspy, not the norm states wife.  Pt 99% RA at this time.

## 2016-08-08 NOTE — Progress Notes (Signed)
Patient ID: Charles Vasquez, male   DOB: 05-01-1935, 81 y.o.   MRN: [id number]  [facility] Progress Note   Assessment/ Plan:   1. 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 Extubated uneventfully but still having some residual difficult to swallow. 2. End-stage renal disease:  will order for hemodialysis again tomorrow-continue Monday/Wednesday/Friday schedule. We'll have him be transported upstairs to the hemodialysis unit. Future dialysis access options appear to be restricted to his lower extremities given recent events. 3. Anemia: Hemoglobin level is stable 10.3, continue to monitor for indications to restart ESA.. 4. Hypertension: Blood pressures appear to be on the "soft" side-monitor cautiously with hemodialysis to limit intradialytic blood pressure drop. 5. Metabolic bone disease: Resumed vitamin D receptor analog with dialysis, resume phosphorus binders as oral intake improves  Subjective:   On a dysphasia diet because of swallowing difficulties noted earlier yesterday. Reports to be feeling fair this morning and had some intradialytic hypotension yesterday.    Objective:   BP (!) 143/107   Pulse 71   Temp 97.7 F (36.5 C) (Oral)   Resp (!) 22   Ht 5\' 7"  (1.702 m)   Wt 67.8 kg (149 lb 7.6 oz)   SpO2 94%   BMI 23.41 kg/m   Physical Exam: Gen: Awake, alert, appears comfortable CVS: Pulse regular rhythm, normal rate, S1 and S2 normal Resp: Clear to auscultation, no rales or rhonchi Abd: Soft, obese, nontender Ext: No lower extremity edema, 2+ upper extremity edema bilaterally  Labs: BMET  Recent Labs Lab 08/04/16 1620 08/04/16 2155 08/05/16 0808 08/06/16 0552 08/07/16 0212  NA 133* 133* 135 136 137  K 5.8* 4.3 4.8 4.7 4.2  CL 98* 99* 101 98* 101  CO2 25 21* 19* 19* 22  GLUCOSE 339*  301* 215* 170* 129*  BUN 40* 42* 50* 40* 46*  CREATININE 7.50* 7.48* 8.09* 6.96* 8.51*  CALCIUM 8.0* 8.0* 8.2* 8.3* 7.9*  PHOS  --   --   --  5.7*  --    CBC  Recent Labs Lab 08/04/16 1620 08/05/16 0808 08/06/16 0552 08/07/16 0212  WBC 6.2 11.3* 13.1* 10.8*  NEUTROABS  --   --   --  9.3*  HGB 10.5* 10.0* 12.4* 10.3*  HCT 32.5* 30.2* 37.4* 31.1*  MCV 93.1 91.0 92.1 90.7  PLT 114* 133* PLATELET CLUMPS NOTED ON SMEAR, COUNT APPEARS DECREASED 127*   Medications:    . albuterol  2.5 mg Nebulization Q6H  . docusate  100 mg Per Tube BID  . heparin  5,000 Units Subcutaneous Q8H  . insulin aspart  0-15 Units Subcutaneous Q4H  . insulin aspart  8 Units Intravenous Once  . RESOURCE THICKENUP CLEAR   Oral Once  . sennosides  5 mL Per Tube BID  . sodium chloride flush  3 mL Intravenous Q12H   Zetta Bills, MD 08/08/2016, 8:37 AM

## 2016-08-08 NOTE — Progress Notes (Signed)
Pt O2 sat 77% on 10 lpm via [place].  Pt states "feels alright".  Paged RT and Rapid Response.  Placed on non-rebreather 15 lmp via [place].  Pt alert and oriented, talking.  Rapid response and RT in room.

## 2016-08-08 NOTE — Progress Notes (Signed)
elink made aware of third loose stool this shift (type 6); no new orders at this time

## 2016-08-08 NOTE — Progress Notes (Signed)
PULMONARY / CRITICAL CARE MEDICINE   Name: Charles Vasquez MRN: [id number] DOB: 06-27-1935    ADMISSION DATE:  08/04/2016 CONSULTATION DATE:  08/04/2016  REFERRING MD :  Dr. Melynda Ripple  CHIEF COMPLAINT:  Facial swelling  INITIAL PRESENTATION:  81 y.o. male with medical history significant for end-stage renal disease on dialysis Monday Wednesday and Fridays, hypertension, diabetes, MI, stroke presents to ED with several days of progressive facial and bilateral upper extremity L>R edema after recent left sided fistula creation.  He also complained of tightness in the face, dysphagia and a choking sensation.   SUBJECTIVE:  Extubated 5/29, some cough yesterday No acute events otherwise HD yesterday  VITAL SIGNS: Temp:  [97.6 F (36.4 C)-99.1 F (37.3 C)] 97.7 F (36.5 C) (05/31 0819) Pulse Rate:  [53-120] 83 (05/31 0900) Resp:  [12-36] 19 (05/31 0900) BP: (71-222)/(24-191) 111/90 (05/31 0900) SpO2:  [84 %-100 %] 91 % (05/31 0900) Weight:  [67.8 kg (149 lb 7.6 oz)-69.5 kg (153 lb 3.5 oz)] 67.8 kg (149 lb 7.6 oz) (05/31 0500)  VENTILATOR SETTINGS:     INTAKE / OUTPUT: I/O last 3 completed shifts: In: 490 [P.O.:360; I.V.:80; IV Piggyback:50] Out: 25 [Urine:25]   PHYSICAL EXAMINATION:  General:  Resting comfortably in bed HENT: NCAT OP clear facial swelling improved PULM: CTA B, normal effort CV: RRR, no mgr  GI: BS+, soft, nontender MSK: normal bulk and tone Derm: some edema in arms bilaterally Neuro: awake, alert, no distress, MAEW   LABS:  CBC  Recent Labs Lab 08/06/16 0552 08/07/16 0212 08/08/16 0835  WBC 13.1* 10.8* 10.0  HGB 12.4* 10.3* 10.3*  HCT 37.4* 31.1* 31.4*  PLT PLATELET CLUMPS NOTED ON SMEAR, COUNT APPEARS DECREASED 127* 120*   Coag's  Recent Labs Lab 08/04/16 1620  INR 0.93   BMET  Recent Labs Lab 08/06/16 0552 08/07/16 0212 08/08/16 0835  NA 136 137 133*  K 4.7 4.2 4.1  CL 98* 101 97*  CO2 19* 22 23  BUN 40* 46* 24*  CREATININE  6.96* 8.51* 5.61*  GLUCOSE 170* 129* 192*   Electrolytes  Recent Labs Lab 08/06/16 0552 08/07/16 0212 08/08/16 0835  CALCIUM 8.3* 7.9* 7.9*  MG 1.8  --   --   PHOS 5.7*  --   --    Sepsis Markers No results for input(s): LATICACIDVEN, PROCALCITON, O2SATVEN in the last 168 hours.   ABG  Recent Labs Lab 08/05/16 0020  PHART 7.367  PCO2ART 37.0  PO2ART 286*   Liver Enzymes  Recent Labs Lab 08/04/16 1620  AST 52*  ALT 33  ALKPHOS 114  BILITOT 0.7  ALBUMIN 3.3*   Cardiac Enzymes No results for input(s): TROPONINI, PROBNP in the last 168 hours.   Glucose  Recent Labs Lab 08/07/16 1614 08/07/16 2019 08/07/16 2356 08/08/16 0407 08/08/16 0442 08/08/16 0818  GLUCAP 92 172* 202* 44* 151* 161*    Imaging No results found.   CULTURES: BCx2 5/28 > neg Tracheal asp 5/27 > OPF MRSA PCR 5/27 > neg  LINES: ETT 5/27 > 5/29  SIGNIFICANT EVENTS: 5/27 Lt fistula ligation 5/28 Left on vent after procedure 5/29 Extubated  SUMMARY: 82 yo male with acute facial swelling after AV fistula procedure requiring intubation. Now resolved.  ASSESSMENT / PLAN:  Acute respiratory failure due to facial swelling> resolved Plan: Aggressive pulmonary hygiene with IS/mobilize Wean FiO2 for sats > 92% Continue BD SLP eval  Out of bed  Acute facial swelling, due to venous hypertension post vascular procedure, improving  Plan: Continue volume removal with HD per MWF schedule  Constipation: Plan: Colace Senna  ESRD Plan: Per renal HD MWF Access per renal/vascular  Diet: regular  PT consult today if unable to get up to chair with RN assistance  Transfer out of ICU today> to [facility] service  Will need to me monitored in hospital for a few more days  Heber [place], MD [place] [facility] Pager: [phone] Cell: [phone] After 3pm or if no response, call [phone]

## 2016-08-08 NOTE — Progress Notes (Signed)
Inpatient Diabetes Program Recommendations  AACE/ADA: New Consensus Statement on Inpatient Glycemic Control (2015)  Target Ranges:  Prepandial:   less than 140 mg/dL      Peak postprandial:   less than 180 mg/dL (1-2 hours)      Critically ill patients:  140 - 180 mg/dL   Lab Results  Component Value Date   GLUCAP 161 (H) 08/08/2016   HGBA1C 7.5 11/10/2008    Review of Glycemic Control Results for Charles Vasquez, Charles Vasquez (MRN [id number]) as of 08/08/2016 09:53  Ref. Range 08/07/2016 20:19 08/07/2016 23:56 08/08/2016 04:07 08/08/2016 04:42 08/08/2016 08:18  Glucose-Capillary Latest Ref Range: 65 - 99 mg/dL 600 (H) 459 (H) 44 (LL) 151 (H) 161 (H)   Diabetes history: DM2 Outpatient Diabetes medications: Lantus 5 units qd Current orders for Inpatient glycemic control: Novolog correction 0-15 units q 4 hrs.  Inpatient Diabetes Program Recommendations:  Noted hypoglycemia 44 post Novolog correction. Please consider: -Decrease Novolog correction to 0-9 units q 4 hrs.  Thank you, Charles Vasquez. Luciana Cammarata, RN, MSN, CDE  Diabetes Coordinator Inpatient Glycemic Control Team Team Pager [phone] (8am-5pm) 08/08/2016 9:55 AM

## 2016-08-08 NOTE — Progress Notes (Signed)
Pt transferred to room 6E25 from 49M via wheelchair.  Pt alert and responsive. Denies pain. Pt sitting up in chair. Up with 1 assist.

## 2016-08-08 NOTE — Progress Notes (Signed)
Hypoglycemic Event  CBG: 44  Treatment: D50 IV 50 mL  Symptoms: Pale  Follow-up CBG: Time:0445 CBG Result: 151  Possible Reasons for Event: Inadequate meal intake  Comments/MD notified:    Veto Kemps

## 2016-08-09 ENCOUNTER — Inpatient Hospital Stay (HOSPITAL_COMMUNITY): Payer: Medicare Other

## 2016-08-09 LAB — RENAL FUNCTION PANEL
ANION GAP: 14 (ref 5–15)
Albumin: 2.5 g/dL — ABNORMAL LOW (ref 3.5–5.0)
BUN: 40 mg/dL — AB (ref 6–20)
CO2: 24 mmol/L (ref 22–32)
Calcium: 7.6 mg/dL — ABNORMAL LOW (ref 8.9–10.3)
Chloride: 97 mmol/L — ABNORMAL LOW (ref 101–111)
Creatinine, Ser: 7.06 mg/dL — ABNORMAL HIGH (ref 0.61–1.24)
GFR calc Af Amer: 8 mL/min — ABNORMAL LOW (ref >60)
GFR calc non Af Amer: 7 mL/min — ABNORMAL LOW (ref >60)
GLUCOSE: 158 mg/dL — AB (ref 65–99)
POTASSIUM: 3.5 mmol/L (ref 3.5–5.1)
Phosphorus: 6 mg/dL — ABNORMAL HIGH (ref 2.5–4.6)
SODIUM: 135 mmol/L (ref 135–145)

## 2016-08-09 LAB — GLUCOSE, CAPILLARY
GLUCOSE-CAPILLARY: 100 mg/dL — AB (ref 65–99)
GLUCOSE-CAPILLARY: 149 mg/dL — AB (ref 65–99)
GLUCOSE-CAPILLARY: 297 mg/dL — AB (ref 65–99)
GLUCOSE-CAPILLARY: 85 mg/dL (ref 65–99)
GLUCOSE-CAPILLARY: 98 mg/dL (ref 65–99)
Glucose-Capillary: 112 mg/dL — ABNORMAL HIGH (ref 65–99)
Glucose-Capillary: 144 mg/dL — ABNORMAL HIGH (ref 65–99)
Glucose-Capillary: 301 mg/dL — ABNORMAL HIGH (ref 65–99)
Glucose-Capillary: 65 mg/dL (ref 65–99)

## 2016-08-09 LAB — BASIC METABOLIC PANEL
Anion gap: 13 (ref 5–15)
BUN: 39 mg/dL — AB (ref 6–20)
CALCIUM: 7.6 mg/dL — AB (ref 8.9–10.3)
CHLORIDE: 96 mmol/L — AB (ref 101–111)
CO2: 25 mmol/L (ref 22–32)
CREATININE: 7.1 mg/dL — AB (ref 0.61–1.24)
GFR calc Af Amer: 8 mL/min — ABNORMAL LOW (ref >60)
GFR calc non Af Amer: 7 mL/min — ABNORMAL LOW (ref >60)
Glucose, Bld: 163 mg/dL — ABNORMAL HIGH (ref 65–99)
Potassium: 3.4 mmol/L — ABNORMAL LOW (ref 3.5–5.1)
SODIUM: 134 mmol/L — AB (ref 135–145)

## 2016-08-09 LAB — CBC
HEMATOCRIT: 28.5 % — AB (ref 39.0–52.0)
HEMOGLOBIN: 9.2 g/dL — AB (ref 13.0–17.0)
MCH: 29.3 pg (ref 26.0–34.0)
MCHC: 32.3 g/dL (ref 30.0–36.0)
MCV: 90.8 fL (ref 78.0–100.0)
Platelets: 127 10*3/uL — ABNORMAL LOW (ref 150–400)
RBC: 3.14 MIL/uL — ABNORMAL LOW (ref 4.22–5.81)
RDW: 15.7 % — ABNORMAL HIGH (ref 11.5–15.5)
WBC: 7.6 10*3/uL (ref 4.0–10.5)

## 2016-08-09 MED ORDER — ALBUTEROL SULFATE (2.5 MG/3ML) 0.083% IN NEBU
2.5000 mg | INHALATION_SOLUTION | Freq: Three times a day (TID) | RESPIRATORY_TRACT | Status: DC
  Administered 2016-08-09 – 2016-08-10 (×3): 2.5 mg via RESPIRATORY_TRACT
  Filled 2016-08-09 (×4): qty 3

## 2016-08-09 MED ORDER — ALBUMIN HUMAN 25 % IV SOLN: 25.0000 g | Freq: Once | INTRAVENOUS | Status: DC

## 2016-08-09 MED ORDER — DARBEPOETIN ALFA 60 MCG/0.3ML IJ SOSY
60.0000 ug | PREFILLED_SYRINGE | INTRAMUSCULAR | Status: DC
  Administered 2016-08-09: 60 ug via INTRAVENOUS

## 2016-08-09 MED ORDER — ACETAMINOPHEN 325 MG PO TABS
ORAL_TABLET | ORAL | Status: AC
  Administered 2016-08-09: 650 mg via ORAL
  Filled 2016-08-09: qty 2

## 2016-08-09 MED ORDER — GLUCOSE 40 % PO GEL
ORAL | Status: AC
  Administered 2016-08-09: 37.5 g
  Filled 2016-08-09: qty 1

## 2016-08-09 MED ORDER — DARBEPOETIN ALFA 60 MCG/0.3ML IJ SOSY
PREFILLED_SYRINGE | INTRAMUSCULAR | Status: AC
  Administered 2016-08-09: 60 ug via INTRAVENOUS
  Filled 2016-08-09: qty 0.3

## 2016-08-09 NOTE — Progress Notes (Signed)
  Speech Language Pathology Treatment: Dysphagia  Patient Details Name: Charles Vasquez MRN: [id number] DOB: 23-Nov-1935 Today's Date: 08/09/2016 Time: 9169-4503 SLP Time Calculation (min) (ACUTE ONLY): 8 min  Assessment / Plan / Recommendation Clinical Impression  Pt seen for safety with diet/liquid recommendations. Cough following each trial thin with multiple swallows and suspected delayed swallow initiation. Instrumental evaluation would assist in etiology of dysphagia and implementation and effectiveness of swallow strategies. MBS scheduled for 1:00 today. Continue with Dys 3 and downgraded liquids to honey thick.   HPI HPI: 81 y.o.malewith medical history significant for end-stage renal disease on dialysis Monday Wednesday and Fridays, hypertension, diabetes, MI, stroke presents to ED with several days of progressive facial and bilateral upper extremity L>R edema after recent left sided fistula creation. He also complained of tightness in the face, dysphagia and a choking sensation. ETT 5/27 for left fistual ligation; remained on vent due to facial swelling; extubated 5/29.       SLP Plan  Continue with current plan of care;New goals to be determined pending instrumental study       Recommendations  Diet recommendations: Dysphagia 3 (mechanical soft);Honey-thick liquid                General recommendations:  (TBD) Oral Care Recommendations: Oral care BID Follow up Recommendations:  (TBD) SLP Visit Diagnosis: Dysphagia, unspecified (R13.10) Plan: Continue with current plan of care;New goals to be determined pending instrumental study                      Royce Macadamia 08/09/2016, 8:52 AM   Breck Coons [place].Ed [employer] [phone]

## 2016-08-09 NOTE — Progress Notes (Signed)
Patient ID: FORRESTER COTRELL, male   DOB: September 27, 1935, 81 y.o.   MRN: [id number]  [facility] Progress Note   Assessment/ Plan:   1. 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 With some voice hoarseness but no shortness of breath. Swelling expected to improve over the next 2-4 weeks 2. End-stage renal disease:  HD today to continue Monday/Wednesday/Friday schedule. Difficulty noted with BP monitoring with cuff over lower limb--will continue efforts at UF. Future dialysis access options appear to be restricted to his lower extremities given recent events. 3. Anemia: Hemoglobin level is lower today--restart ESA. 4. Hypertension: Blood pressures appear to be on the "soft" side-monitor cautiously with hemodialysis to limit intradialytic blood pressure drop. 5. Metabolic bone disease: Resumed vitamin D receptor analog with dialysis, resume phosphorus binders as oral intake improves  Subjective:   Reports to be feeling sleepy this morning and denies any shortness of breath or choking sensation.    Objective:   BP (!) 85/21   Pulse 74   Temp 99 F (37.2 C)   Resp (!) 29   Ht 5\' 7"  (1.702 m)   Wt 67.4 kg (148 lb 9.4 oz)   SpO2 97%   BMI 23.27 kg/m   Physical Exam: Gen: Resting comfortably on dialysis CVS: Pulse regular rhythm, normal rate, S1 and S2 normal Resp: Clear to auscultation, no rales or rhonchi Abd: Soft, obese, nontender Ext: No lower extremity edema, 2+ upper extremity edema bilaterally  Labs: BMET  Recent Labs Lab 08/04/16 1620 08/04/16 2155 08/05/16 0808 08/06/16 0552 08/07/16 0212 08/08/16 0835 08/09/16 0453  NA 133* 133* 135 136 137 133* 135  134*  K 5.8* 4.3 4.8 4.7 4.2 4.1 3.5  3.4*  CL 98* 99* 101 98* 101 97* 97*  96*  CO2 25 21* 19* 19* 22 23 24  25   GLUCOSE 339* 301* 215*  170* 129* 192* 158*  163*  BUN 40* 42* 50* 40* 46* 24* 40*  39*  CREATININE 7.50* 7.48* 8.09* 6.96* 8.51* 5.61* 7.06*  7.10*  CALCIUM 8.0* 8.0* 8.2* 8.3* 7.9* 7.9* 7.6*  7.6*  PHOS  --   --   --  5.7*  --   --  6.0*   CBC  Recent Labs Lab 08/06/16 0552 08/07/16 0212 08/08/16 0835 08/09/16 0453  WBC 13.1* 10.8* 10.0 7.6  NEUTROABS  --  9.3*  --   --   HGB 12.4* 10.3* 10.3* 9.2*  HCT 37.4* 31.1* 31.4* 28.5*  MCV 92.1 90.7 91.0 90.8  PLT PLATELET CLUMPS NOTED ON SMEAR, COUNT APPEARS DECREASED 127* 120* 127*   Medications:    . albuterol  2.5 mg Nebulization TID  . docusate  100 mg Per Tube BID  . heparin  5,000 Units Subcutaneous Q8H  . insulin aspart  0-15 Units Subcutaneous Q4H  . insulin aspart  8 Units Intravenous Once  . RESOURCE THICKENUP CLEAR   Oral Once  . sennosides  5 mL Per Tube BID  . sevelamer carbonate  1.6 g Oral TID WC  . sodium chloride flush  3 mL Intravenous Q12H   Zetta Bills, MD 08/09/2016, 9:35 AM

## 2016-08-09 NOTE — Progress Notes (Signed)
Dialysis treatment completed.  2000 mL ultrafiltrated.  1500 mL net fluid removal.  Patient status unchanged. Lung sounds diminished to ausculation in all fields. BUE moderate edema. Cardiac: NSR to ST.  Cleansed RIJ catheter with chlorhexidine.  Disconnected lines and flushed ports with saline per protocol.  Ports locked with heparin and capped per protocol.    Report given to bedside, RN Abby.

## 2016-08-09 NOTE — Progress Notes (Signed)
Patient arrived to unit by bed.  Reviewed treatment plan and this RN agrees with plan.  Report received from bedside RN, Abby.  Consent verified.  Patient A & O  X 4.   Lung sounds diminished to ausculation in all fields. BUE non pitting edema. Cardiac:  NSR c ectopy.  Removed caps and cleansed RIJ catheter with chlorhedxidine.  Aspirated ports of heparin and flushed them with saline per protocol.  Connected and secured lines, initiated treatment at 0904.  UF Goal of 2500 mL and net fluid removal 2 L.  Will continue to monitor.

## 2016-08-09 NOTE — Procedures (Signed)
Patient seen on Hemodialysis. QB 400, UF goal 1.5L (Net) Treatment adjusted as needed.  Zetta Bills MD [facility]. Office # [phone] Pager # [phone] 9:34 AM

## 2016-08-09 NOTE — Progress Notes (Signed)
Nephrology notified of BP outside of parameters regarding both systolic and diastolic readings.  Telephone order received for PRN albumin, OK to run with systolic in 80's.  Will continue to monitor.

## 2016-08-09 NOTE — Progress Notes (Addendum)
Results for ROWELL, FOSNIGHT (MRN [id number]) as of 08/09/2016 13:24  Ref. Range 08/09/2016 00:14 08/09/2016 02:05 08/09/2016 04:20 08/09/2016 07:44 08/09/2016 11:24  Glucose-Capillary Latest Ref Range: 65 - 99 mg/dL 65 678 (H) 938 (H) 101 (H) 100 (H)  Noted that blood sugars have been less than 100 mg/dl. Recommend decreasing Novolog correction scale to SENSITIVE TID  & HS if blood sugars continue to be low and if eating.  Will continue to monitor blood sugars while in the hospital.   Smith Mince RN BSN CDE Diabetes Coordinator Pager: [phone]  8am-5pm

## 2016-08-09 NOTE — Progress Notes (Addendum)
[facility] Progress Note  Subjective    Patient seen in HD. No complaints. Says swelling in left arm is better.  Objective Vitals:   08/09/16 1100 08/09/16 1130  BP: (!) 89/30 (!) 139/28  Pulse: 83 81  Resp:    Temp:      Intake/Output Summary (Last 24 hours) at 08/09/16 1159 Last data filed at 08/09/16 0600  Gross per 24 hour  Intake              780 ml  Output              150 ml  Net              630 ml   Left arm swelling. Left arm incisions clean without drainage.   Assessment/Planning: 81 y.o. male is s/p: ligation left upper arm graft.  5 Days Post-Op   Continue elevation left arm and dry dressings as needed if there is drainage. Apply gentle compression with ACE wrap. Follow-up with [name] in 2 weeks to check on L arm swelling.  Raymond Gurney 08/09/2016 11:59 AM --  Laboratory CBC    Component Value Date/Time   WBC 7.6 08/09/2016 0453   HGB 9.2 (L) 08/09/2016 0453   HGB 11.8 (L) 11/29/2011 0614   HCT 28.5 (L) 08/09/2016 0453   HCT 34.9 (L) 11/29/2011 0614   PLT 127 (L) 08/09/2016 0453   PLT 97 (L) 11/29/2011 0614    BMET    Component Value Date/Time   NA 134 (L) 08/09/2016 0453   NA 135 08/09/2016 0453   NA 138 10/15/2011 1339   K 3.4 (L) 08/09/2016 0453   K 3.5 08/09/2016 0453   K 4.5 11/28/2011 1340   CL 96 (L) 08/09/2016 0453   CL 97 (L) 08/09/2016 0453   CL 104 10/15/2011 1339   CO2 25 08/09/2016 0453   CO2 24 08/09/2016 0453   CO2 24 10/15/2011 1339   GLUCOSE 163 (H) 08/09/2016 0453   GLUCOSE 158 (H) 08/09/2016 0453   GLUCOSE 121 (H) 10/15/2011 1339   BUN 39 (H) 08/09/2016 0453   BUN 40 (H) 08/09/2016 0453   BUN 47 (H) 10/15/2011 1339   CREATININE 7.10 (H) 08/09/2016 0453   CREATININE 7.06 (H) 08/09/2016 0453   CREATININE 3.88 (H) 10/15/2011 1339   CALCIUM 7.6 (L) 08/09/2016 0453   CALCIUM 7.6 (L) 08/09/2016 0453   CALCIUM 8.1 (L) 10/15/2011 1339   GFRNONAA 7 (L) 08/09/2016 0453   GFRNONAA 7 (L)  08/09/2016 0453   GFRNONAA 14 (L) 10/15/2011 1339   GFRAA 8 (L) 08/09/2016 0453   GFRAA 8 (L) 08/09/2016 0453   GFRAA 17 (L) 10/15/2011 1339    COAG Lab Results  Component Value Date   INR 0.93 08/04/2016   No results found for: PTT  Antibiotics Anti-infectives    Start     Dose/Rate Route Frequency Ordered Stop   08/05/16 1200  vancomycin (VANCOCIN) IVPB 750 mg/150 ml premix  Status:  Discontinued     750 mg 150 mL/hr over 60 Minutes Intravenous Every M-W-F (Hemodialysis) 08/05/16 0857 08/06/16 0933   08/04/16 1800  vancomycin (VANCOCIN) 500 mg in sodium chloride 0.9 % 100 mL IVPB     500 mg 100 mL/hr over 60 Minutes Intravenous  Once 08/04/16 1735 08/04/16 1930       Maris Berger, PA-C [facility] Office: [phone] Pager: [phone] 08/09/2016 11:59 AM  Addendum  Pt apparently resolving L arm  and central swelling related to SVC occlusion.  L arm incisions remain intact for now.  Follow up in the office in 2 weeks for re-evaluation.  Pt is no longer a candidate for L access.  Pt is going to have to consider proceeding with thigh AVG.   [name], MD, FACS [facility] [place] Office: [phone] Pager: [phone]  08/09/2016, 1:40 PM

## 2016-08-09 NOTE — Progress Notes (Signed)
Modified Barium Swallow Progress Note  Patient Details  Name: Charles Vasquez MRN: [id number] Date of Birth: 1935-03-29  Today's Date: 08/09/2016  Modified Barium Swallow completed.  Full report located under Chart Review in the Imaging Section.  Brief recommendations include the following:  Clinical Impression  Pt demonstrated moderate oral impairments including decreased cohesion with barium spilling sublingualy and difficulty forming bolus with liquid consistencies. Mild-moderate sensorimotor pharyngeal dysphagia marked by delayed swallow initiation to pyriforms as study progressed leading to silent penetration with nectar to vocal cords. Penetrates cleared off cords with cues to cough however unable to clear laryngeal vestibule (barium remained on anterior vestibule wall) throughout remainder of study. Vallecular and pyriform sinus residue noted and spillage of barium during mastication of solid building up in vallecular and pyriform sinuses. Verbal cues for second swallow cleared moderate amount. Pt is at high aspiration risk and requires modification of diet texture to Dys 2, continue honey thick liquids, no straws, swallow 2 times, cough during meal. ST will continue intervention.       Swallow Evaluation Recommendations       SLP Diet Recommendations: Dysphagia 2 (Fine chop) solids;Honey thick liquids   Liquid Administration via: Cup;No straw   Medication Administration: Crushed with puree   Supervision: Patient able to self feed;Full supervision/cueing for compensatory strategies   Compensations: Minimize environmental distractions;Slow rate;Small sips/bites;Lingual sweep for clearance of pocketing;Hard cough after swallow;Multiple dry swallows after each bite/sip   Postural Changes: Seated upright at 90 degrees   Oral Care Recommendations: Oral care BID        Royce Macadamia 08/09/2016,3:40 PM   Breck Coons [place].Ed [employer] [phone]

## 2016-08-09 NOTE — Progress Notes (Signed)
PULMONARY / CRITICAL CARE MEDICINE   Name: Charles Vasquez MRN: [id number] DOB: 1936/01/05    ADMISSION DATE:  08/04/2016 CONSULTATION DATE:  08/04/2016  REFERRING MD :  Dr. Melynda Ripple  CHIEF COMPLAINT:  Facial swelling  INITIAL PRESENTATION:  81 y.o. male with medical history significant for end-stage renal disease on dialysis Monday Wednesday and Fridays, hypertension, diabetes, MI, stroke presents to ED with several days of progressive facial and bilateral upper extremity L>R edema after recent left sided fistula creation.  He also complained of tightness in the face, dysphagia and a choking sensation.   SUBJECTIVE:  In HD. Raspy voice. BP inaccurate. On room air currently  VITAL SIGNS: Temp:  [97.6 F (36.4 C)-99.4 F (37.4 C)] 99 F (37.2 C) (06/01 0900) Pulse Rate:  [74-114] 83 (06/01 1100) Resp:  [17-29] 29 (06/01 0900) BP: (85-159)/(21-94) 89/30 (06/01 1100) SpO2:  [91 %-97 %] 97 % (06/01 0424) Weight:  [148 lb 9.4 oz (67.4 kg)-152 lb 4.8 oz (69.1 kg)] 148 lb 9.4 oz (67.4 kg) (06/01 0900)  VENTILATOR SETTINGS:     INTAKE / OUTPUT: I/O last 3 completed shifts: In: 898 [P.O.:898] Out: 150 [Urine:150]   PHYSICAL EXAMINATION:  General:  WNWD elderly male. Speech very hard to understand due to accent and missing teeth. HEENT: Oral mucosa with some residual fullness, raspy voice PSY:Nl affect Neuro: Intact follows commands CV: HSR RRR PULM: Decreased bs thru out YQ:MVHQ, non-tender, bsx4 active  Extremities: left fore arm av ligation site , open, no drainage. Rt forearm swelling noted. Rt I J HD tunneled cath in use Skin: no rashes or lesions   LABS:  CBC  Recent Labs Lab 08/07/16 0212 08/08/16 0835 08/09/16 0453  WBC 10.8* 10.0 7.6  HGB 10.3* 10.3* 9.2*  HCT 31.1* 31.4* 28.5*  PLT 127* 120* 127*   Coag's  Recent Labs Lab 08/04/16 1620  INR 0.93   BMET  Recent Labs Lab 08/07/16 0212 08/08/16 0835 08/09/16 0453  NA 137 133* 135  134*  K 4.2  4.1 3.5  3.4*  CL 101 97* 97*  96*  CO2 22 23 24  25   BUN 46* 24* 40*  39*  CREATININE 8.51* 5.61* 7.06*  7.10*  GLUCOSE 129* 192* 158*  163*   Electrolytes  Recent Labs Lab 08/06/16 0552 08/07/16 0212 08/08/16 0835 08/09/16 0453  CALCIUM 8.3* 7.9* 7.9* 7.6*  7.6*  MG 1.8  --   --   --   PHOS 5.7*  --   --  6.0*   Sepsis Markers No results for input(s): LATICACIDVEN, PROCALCITON, O2SATVEN in the last 168 hours.   ABG  Recent Labs Lab 08/05/16 0020  PHART 7.367  PCO2ART 37.0  PO2ART 286*   Liver Enzymes  Recent Labs Lab 08/04/16 1620 08/09/16 0453  AST 52*  --   ALT 33  --   ALKPHOS 114  --   BILITOT 0.7  --   ALBUMIN 3.3* 2.5*   Cardiac Enzymes No results for input(s): TROPONINI, PROBNP in the last 168 hours.   Glucose  Recent Labs Lab 08/08/16 1648 08/08/16 2022 08/09/16 0014 08/09/16 0205 08/09/16 0420 08/09/16 0744  GLUCAP 210* 246* 65 144* 149* 112*    Imaging No results found.   CULTURES: BCx2 5/28 > neg Tracheal asp 5/27 > OPF MRSA PCR 5/27 > neg  LINES: ETT 5/27 > 5/29  SIGNIFICANT EVENTS: 5/27 Lt fistula ligation 5/28 Left on vent after procedure 5/29 Extubated 6/1 in HD unit  SUMMARY: 80  yo male with acute facial swelling after AV fistula procedure requiring intubation. Now resolved.  ASSESSMENT / PLAN:  Acute respiratory failure due to facial swelling> resolved UAO with some residual vocal cord impediment  Plan: with raspy voice Aggressive pulmonary hygiene with IS/mobilize Wean FiO2 for sats > 92% Continue BD SLP eval -> dysphagia 3 diet ordered Out of bed  Acute facial swelling, due to venous hypertension post vascular procedure, improving daily Plan: Continue volume removal with HD per MWF schedule  Constipation: Plan: Colace Senna  ESRD Plan: Per renal HD MWF Access per renal/vascular  Diet: regular  Transferred to 6 E 5/31. He remains with raspy voice. RRT called 5/31 for hypoxia. Has not  ambulated yet. Vascular surgery paged(in surgery) to set up follow up. He wil need HD [facility] facility M W F. NO need to follow up with PCCM but he should see his PCP in 10-14 days. Not sure he is ready to be discharged today. Dr. Henrene Pastor to evaluate.   Brett Canales Keelee Yankey ACNP [facility] Pager [phone] till 3 pm If no answer page [phone] 08/09/2016, 11:28 AM

## 2016-08-10 DIAGNOSIS — I10 Essential (primary) hypertension: Secondary | ICD-10-CM

## 2016-08-10 DIAGNOSIS — I87309 Chronic venous hypertension (idiopathic) without complications of unspecified lower extremity: Secondary | ICD-10-CM

## 2016-08-10 DIAGNOSIS — E875 Hyperkalemia: Secondary | ICD-10-CM

## 2016-08-10 LAB — CULTURE, BLOOD (ROUTINE X 2)
Culture: NO GROWTH
Culture: NO GROWTH
SPECIAL REQUESTS: ADEQUATE
Special Requests: ADEQUATE

## 2016-08-10 LAB — GLUCOSE, CAPILLARY
GLUCOSE-CAPILLARY: 150 mg/dL — AB (ref 65–99)
GLUCOSE-CAPILLARY: 271 mg/dL — AB (ref 65–99)
Glucose-Capillary: 113 mg/dL — ABNORMAL HIGH (ref 65–99)
Glucose-Capillary: 306 mg/dL — ABNORMAL HIGH (ref 65–99)
Glucose-Capillary: 141 mg/dL — ABNORMAL HIGH (ref 65–99)

## 2016-08-10 MED ORDER — INSULIN ASPART 100 UNIT/ML ~~LOC~~ SOLN: 0.0000 [IU] | Freq: Every day | SUBCUTANEOUS | Status: DC

## 2016-08-10 MED ORDER — ALBUTEROL SULFATE (2.5 MG/3ML) 0.083% IN NEBU: 2.5000 mg | INHALATION_SOLUTION | Freq: Four times a day (QID) | RESPIRATORY_TRACT | Status: DC | PRN

## 2016-08-10 MED ORDER — INSULIN ASPART 100 UNIT/ML ~~LOC~~ SOLN
0.0000 [IU] | Freq: Three times a day (TID) | SUBCUTANEOUS | Status: DC
  Administered 2016-08-10: 11 [IU] via SUBCUTANEOUS
  Administered 2016-08-10 – 2016-08-11 (×2): 8 [IU] via SUBCUTANEOUS
  Administered 2016-08-11: 2 [IU] via SUBCUTANEOUS

## 2016-08-10 NOTE — Progress Notes (Signed)
Progress Note    [name]  ZOX:[id number] DOB: Aug 06, 1935  DOA: 08/04/2016 PCP: Toma Deiters, MD    Brief Narrative:   Chief complaint: Follow-up superior vena cava syndrome  Medical records reviewed and are as summarized below:  Charles Vasquez is an 81 y.o. male with a PMH of ESRD on HD, hypertension, diabetes, history of MI, history of stroke who was admitted 08/04/16 for evaluation of progressive facial and bilateral upper extremity, left greater than right, swelling after recent left AV fistula creation. Was taken to the OR for left fistula ligation 08/04/16, and remained intubated postoperatively, managed by PCCM. He was extubated 08/06/16. Care transferred to [facility] 08/10/16.  Assessment/Plan:   Principal Problem:   Venous hypertension status post AVG procedure/Facial swelling Status post emergent ligation of the left upper arm AVG. Swelling improved.  Active Problems:   Diabetes (HCC) No recent hemoglobin A1c. Currently being managed with moderate scale SSI every 4 hours. CBGs 85-301. Since the patient is eating, will change SSI to moderate scale Q AC/HS. Check hemoglobin A1c.    Essential hypertension Continue hydralazine as needed.    GERD    End stage renal disease (HCC) Management per nephrology. Receives HD M/W/F.    Acute respiratory failure (HCC) Extubated 08/06/16. Respiratory status stable.    Hyperkalemia Resolved.    Anemia of chronic kidney disease Aranesp per nephrology.    Metabolic bone disease Per nephrology. Continue Renvela.     Family Communication/Anticipated D/C date and plan/Code Status   DVT prophylaxis: Heparin ordered. Code Status: Full Code.  Family Communication: No family at the bedside. Disposition Plan: Home 24-48 hours.   Medical Consultants:    Nephrology  PCCM  Vascular Surgery   Procedures:    Intubation  Hemodialysis  Ligation of left upper arm AV graft 08/04/16  Anti-Infectives:     None  Subjective:   Mr. [name] says he feels better and that his breathing has improved although he still mildly dyspneic at times.  Objective:    Vitals:   08/09/16 1744 08/09/16 2025 08/09/16 2105 08/10/16 0436  BP: 105/68 (!) 91/49  (!) 105/33  Pulse: 77 84  76  Resp: 16 18  18   Temp: 98.7 F (37.1 C) 98.9 F (37.2 C)  99.5 F (37.5 C)  TempSrc: Oral Oral  Oral  SpO2: 96% 98% 94% 97%  Weight:  68.7 kg (151 lb 7.3 oz)    Height:        Intake/Output Summary (Last 24 hours) at 08/10/16 0902 Last data filed at 08/10/16 0600  Gross per 24 hour  Intake                3 ml  Output             1501 ml  Net            -1498 ml   Filed Weights   08/09/16 0900 08/09/16 1304 08/09/16 2025  Weight: 67.4 kg (148 lb 9.4 oz) 68.9 kg (151 lb 14.4 oz) 68.7 kg (151 lb 7.3 oz)    Exam: General exam: Calm, comfortable, sitting up in chair.  Respiratory system: Slightly coarse bilaterally, no frank wheezes or rales.  Cardiovascular system:  Regular rate, and rhythm. No murmurs, rubs, or gallops. Gastrointestinal system:  Abdomen is soft, nontender, nondistended with normal active bowel sounds. No masses. Central nervous system: Nonfocal. Alert and oriented. Extremities: Upper extremity swelling appreciated, worse on the right.  Skin: Dime  sized blister right inner forearm. Venous stasis/discolored lower extremities. Psychiatry: Insight and judgment are normal, mood and affect are normal.  Data Reviewed:   I have personally reviewed following labs and imaging studies:  Labs: Basic Metabolic Panel:  Recent Labs Lab 08/05/16 0808 08/06/16 0552 08/07/16 0212 08/08/16 0835 08/09/16 0453  NA 135 136 137 133* 135  134*  K 4.8 4.7 4.2 4.1 3.5  3.4*  CL 101 98* 101 97* 97*  96*  CO2 19* 19* 22 23 24  25   GLUCOSE 215* 170* 129* 192* 158*  163*  BUN 50* 40* 46* 24* 40*  39*  CREATININE 8.09* 6.96* 8.51* 5.61* 7.06*  7.10*  CALCIUM 8.2* 8.3* 7.9* 7.9* 7.6*  7.6*  MG   --  1.8  --   --   --   PHOS  --  5.7*  --   --  6.0*   GFR Estimated Creatinine Clearance: 8 mL/min (A) (by C-G formula based on SCr of 7.1 mg/dL (H)). Liver Function Tests:  Recent Labs Lab 08/04/16 1620 08/09/16 0453  AST 52*  --   ALT 33  --   ALKPHOS 114  --   BILITOT 0.7  --   PROT 6.6  --   ALBUMIN 3.3* 2.5*   Coagulation profile  Recent Labs Lab 08/04/16 1620  INR 0.93    CBC:  Recent Labs Lab 08/05/16 0808 08/06/16 0552 08/07/16 0212 08/08/16 0835 08/09/16 0453  WBC 11.3* 13.1* 10.8* 10.0 7.6  NEUTROABS  --   --  9.3*  --   --   HGB 10.0* 12.4* 10.3* 10.3* 9.2*  HCT 30.2* 37.4* 31.1* 31.4* 28.5*  MCV 91.0 92.1 90.7 91.0 90.8  PLT 133* PLATELET CLUMPS NOTED ON SMEAR, COUNT APPEARS DECREASED 127* 120* 127*   CBG:  Recent Labs Lab 08/09/16 1738 08/09/16 2025 08/09/16 2339 08/10/16 0436 08/10/16 0735  GLUCAP 301* 297* 85 113* 150*    Microbiology Recent Results (from the past 240 hour(s))  MRSA PCR Screening     Status: None   Collection Time: 08/04/16  4:32 PM  Result Value Ref Range Status   MRSA by PCR NEGATIVE NEGATIVE Final    Comment:        The GeneXpert MRSA Assay (FDA approved for NASAL specimens only), is one component of a comprehensive MRSA colonization surveillance program. It is not intended to diagnose MRSA infection nor to guide or monitor treatment for MRSA infections.   Culture, respiratory (NON-Expectorated)     Status: None   Collection Time: 08/04/16 11:59 PM  Result Value Ref Range Status   Specimen Description TRACHEAL ASPIRATE  Final   Special Requests NONE  Final   Gram Stain   Final    RARE WBC PRESENT, PREDOMINANTLY PMN FEW GRAM NEGATIVE RODS FEW GRAM NEGATIVE DIPLOCOCCI RARE GRAM POSITIVE COCCI IN CLUSTERS    Culture Consistent with normal respiratory flora.  Final   Report Status 08/07/2016 FINAL  Final  Culture, blood (routine x 2)     Status: None (Preliminary result)   Collection Time: 08/05/16  12:04 AM  Result Value Ref Range Status   Specimen Description BLOOD RIGHT HAND  Final   Special Requests IN PEDIATRIC BOTTLE Blood Culture adequate volume  Final   Culture NO GROWTH 4 DAYS  Final   Report Status PENDING  Incomplete  Culture, blood (routine x 2)     Status: None (Preliminary result)   Collection Time: 08/05/16 10:32 AM  Result Value Ref Range  Status   Specimen Description BLOOD RIGHT HAND  Final   Special Requests IN PEDIATRIC BOTTLE Blood Culture adequate volume  Final   Culture NO GROWTH 4 DAYS  Final   Report Status PENDING  Incomplete    Radiology: Dg Swallowing Func-speech Pathology  Result Date: 08/09/2016 Objective Swallowing Evaluation: Type of Study: MBS-Modified Barium Swallow Study Patient Details Name: VIMAL DEREGO MRN: [id number] Date of Birth: 1935/08/06 Today's Date: 08/09/2016 Time: SLP Start Time (ACUTE ONLY): 1421-SLP Stop Time (ACUTE ONLY): 1438 SLP Time Calculation (min) (ACUTE ONLY): 17 min Past Medical History: Past Medical History: Diagnosis Date . Chronic kidney disease   dialysis, [name]- M,W,F . Diabetes mellitus without complication (HCC)   lantus daily and humalog sliding scale, type2 . Dizziness  . History of blood transfusion  . Hypertension   takes Amlodipine every other day . Myocardial infarction (HCC)  . Pneumonia   last yr . Stroke [facility])  Past Surgical History: Past Surgical History: Procedure Laterality Date . ARTERIOVENOUS GRAFT PLACEMENT   . AV FISTULA PLACEMENT  03/31/2012  Procedure: ARTERIOVENOUS (AV) FISTULA CREATION;  Surgeon: [name], MD;  Location: [facility] OR;  Service: Vascular;  Laterality: Right;  First stage Brachial vein transposition . AV FISTULA PLACEMENT Right 08/25/2012  Procedure: INSERTION OF ARTERIOVENOUS (AV) GORE-TEX GRAFT ARM;  Surgeon: [name], MD;  Location: [facility] OR;  Service: Vascular;  Laterality: Right; . AV FISTULA PLACEMENT Left 07/23/2016  Procedure: INSERTION OF ARTERIOVENOUS (AV) GORE-TEX GRAFT LEFT UPPER ARM;   Surgeon: [name], MD;  Location: [facility] OR;  Service: Vascular;  Laterality: Left; . EYE SURGERY Bilateral   cataracts . INSERTION OF DIALYSIS CATHETER Left 05/05/2012  Procedure: INSERTION OF DIALYSIS CATHETER;  Surgeon: Chuck Hint, MD;  Location: [facility] OR;  Service: Vascular;  Laterality: Left; . IR FLUORO GUIDE CV LINE RIGHT  06/10/2016 . IR GENERIC HISTORICAL  06/07/2016  IR US GUIDE VASC ACCESS RIGHT 06/07/2016 Oley Balm, MD [facility]-INTERV RAD . IR THROMBECTOMY AV FISTULA W/THROMBOLYSIS/PTA INC/SHUNT/IMG RIGHT Right 06/07/2016 . IR US GUIDE VASC ACCESS RIGHT  06/10/2016 . LIGATION ARTERIOVENOUS GORTEX GRAFT Left 08/04/2016  Procedure: LIGATION ARTERIOVENOUS GORTEX GRAFT;  Surgeon: Larina Earthly, MD;  Location: [facility] OR;  Service: Vascular;  Laterality: Left; . LIGATION OF ARTERIOVENOUS  FISTULA Right 08/25/2012  Procedure: LIGATION OF ARTERIOVENOUS  FISTULA;  Surgeon: [name], MD;  Location: [facility] OR;  Service: Vascular;  Laterality: Right;  Ligation of right brachial vein transposition . REMOVAL OF A DIALYSIS CATHETER Right 05/05/2012  Procedure: REMOVAL OF A DIALYSIS CATHETER;  Surgeon: Chuck Hint, MD;  Location: [facility] OR;  Service: Vascular;  Laterality: Right; . TOE AMPUTATION   . UPPER EXTREMITY VENOGRAPHY Bilateral 07/18/2016  Procedure: Bilateral Upper Extremity Venography;  Surgeon: [name], MD;  Location: [facility] INVASIVE CV LAB;  Service: Cardiovascular;  Laterality: Bilateral; HPI: 81 y.o.malewith medical history significant for end-stage renal disease on dialysis Monday Wednesday and Fridays, hypertension, diabetes, MI, stroke presents to ED with several days of progressive facial and bilateral upper extremity L>R edema after recent left sided fistula creation. He also complained of tightness in the face, dysphagia and a choking sensation. ETT 5/27 for left fistual ligation; remained on vent due to facial swelling; extubated 5/29.  Subjective: alert, communicative Assessment / Plan /  Recommendation CHL IP CLINICAL IMPRESSIONS 08/09/2016 Clinical Impression Pt demonstrated moderate oral impairments including decreased cohesion with barium spilling sublingualy and difficulty forming bolus with liquid consistencies. Mild-moderate sensorimotor pharyngeal dysphagia  marked by delayed swallow initiation to pyriforms as study progressed leading to silent penetration with nectar to vocal cords. Penetrates cleared off cords with cues to cough however unable to clear laryngeal vestibule (barium remained on anterior vestibule wall) throughout remainder of study. Vallecular and pyriform sinus residue noted and spillage of barium during mastication of solid building up in vallecular and pyriform sinuses. Verbal cues for second swallow cleared moderate amount. Pt is at high aspiration risk and requires modification of diet texture to Dys 2, continue honey thick liquids, no straws, swallow 2 times, cough during meal. ST will continue intervention.     SLP Visit Diagnosis Dysphagia, oropharyngeal phase (R13.12) Attention and concentration deficit following -- Frontal lobe and executive function deficit following -- Impact on safety and function (No Data)   CHL IP TREATMENT RECOMMENDATION 08/09/2016 Treatment Recommendations Therapy as outlined in treatment plan below   Prognosis 08/09/2016 Prognosis for Safe Diet Advancement Good Barriers to Reach Goals Cognitive deficits Barriers/Prognosis Comment -- CHL IP DIET RECOMMENDATION 08/09/2016 SLP Diet Recommendations Dysphagia 2 (Fine chop) solids;Honey thick liquids Liquid Administration via Cup;No straw Medication Administration Crushed with puree Compensations Minimize environmental distractions;Slow rate;Small sips/bites;Lingual sweep for clearance of pocketing;Hard cough after swallow;Multiple dry swallows after each bite/sip Postural Changes Seated upright at 90 degrees   CHL IP OTHER RECOMMENDATIONS 08/09/2016 Recommended Consults -- Oral Care Recommendations Oral care  BID Other Recommendations --   CHL IP FOLLOW UP RECOMMENDATIONS 08/09/2016 Follow up Recommendations (No Data)   CHL IP FREQUENCY AND DURATION 08/09/2016 Speech Therapy Frequency (ACUTE ONLY) min 2x/week Treatment Duration 2 weeks      CHL IP ORAL PHASE 08/09/2016 Oral Phase Impaired Oral - Pudding Teaspoon -- Oral - Pudding Cup -- Oral - Honey Teaspoon -- Oral - Honey Cup Decreased bolus cohesion;Delayed oral transit;Lingual/palatal residue Oral - Nectar Teaspoon -- Oral - Nectar Cup Decreased bolus cohesion;Delayed oral transit;Lingual/palatal residue Oral - Nectar Straw -- Oral - Thin Teaspoon -- Oral - Thin Cup -- Oral - Thin Straw -- Oral - Puree -- Oral - Mech Soft -- Oral - Regular Delayed oral transit;Impaired mastication Oral - Multi-Consistency -- Oral - Pill -- Oral Phase - Comment --  CHL IP PHARYNGEAL PHASE 08/09/2016 Pharyngeal Phase Impaired Pharyngeal- Pudding Teaspoon -- Pharyngeal -- Pharyngeal- Pudding Cup -- Pharyngeal -- Pharyngeal- Honey Teaspoon -- Pharyngeal -- Pharyngeal- Honey Cup Delayed swallow initiation-pyriform sinuses;Pharyngeal residue - pyriform;Pharyngeal residue - valleculae;Reduced laryngeal elevation Pharyngeal -- Pharyngeal- Nectar Teaspoon -- Pharyngeal -- Pharyngeal- Nectar Cup Penetration/Aspiration during swallow;Pharyngeal residue - valleculae;Pharyngeal residue - pyriform;Reduced laryngeal elevation Pharyngeal Material enters airway, CONTACTS cords and not ejected out Pharyngeal- Nectar Straw -- Pharyngeal -- Pharyngeal- Thin Teaspoon -- Pharyngeal -- Pharyngeal- Thin Cup -- Pharyngeal -- Pharyngeal- Thin Straw -- Pharyngeal -- Pharyngeal- Puree -- Pharyngeal -- Pharyngeal- Mechanical Soft -- Pharyngeal -- Pharyngeal- Regular Pharyngeal residue - pyriform;Pharyngeal residue - valleculae Pharyngeal -- Pharyngeal- Multi-consistency -- Pharyngeal -- Pharyngeal- Pill -- Pharyngeal -- Pharyngeal Comment --  CHL IP CERVICAL ESOPHAGEAL PHASE 08/09/2016 Cervical Esophageal Phase WFL  Pudding Teaspoon -- Pudding Cup -- Honey Teaspoon -- Honey Cup -- Nectar Teaspoon -- Nectar Cup -- Nectar Straw -- Thin Teaspoon -- Thin Cup -- Thin Straw -- Puree -- Mechanical Soft -- Regular -- Multi-consistency -- Pill -- Cervical Esophageal Comment -- No flowsheet data found. Royce Macadamia 08/09/2016, 3:40 PM Breck Coons Lonell Face.Ed CCC-SLP Pager [phone]               Medications:   .  albuterol  2.5 mg Nebulization TID  . darbepoetin (ARANESP) injection - DIALYSIS  60 mcg Intravenous Q Fri-HD  . docusate  100 mg Per Tube BID  . heparin  5,000 Units Subcutaneous Q8H  . insulin aspart  0-15 Units Subcutaneous Q4H  . insulin aspart  8 Units Intravenous Once  . RESOURCE THICKENUP CLEAR   Oral Once  . sennosides  5 mL Per Tube BID  . sevelamer carbonate  1.6 g Oral TID WC  . sodium chloride flush  3 mL Intravenous Q12H   Continuous Infusions: . sodium chloride    . sodium chloride    . albumin human     Medical decision making is moderately complex therefore this is a level 2 visit. (> 3 problem points, 0 data points, moderate risk: Need 2 out of 3)   LOS: 6 days   RAMA,CHRISTINA  [facility] Pager [phone]. If unable to reach me by pager, please call my cell phone at [phone].  *Please refer to [email or web address], password [email or web address] to get updated schedule on who will round on this patient, as hospitalists switch teams weekly. If 7PM-7AM, please contact night-coverage at [email or web address], password [email or web address] for any overnight needs.  08/10/2016, 9:02 AM

## 2016-08-10 NOTE — Progress Notes (Signed)
Patient ID: Charles Vasquez, male   DOB: 05-Jan-1936, 81 y.o.   MRN: [id number]  [facility] Progress Note   Assessment/ Plan:   1. 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 Anticipate slow improvement of swelling-aggressive ultrafiltration limited by intradialytic hypotension. Given the absence of upper extremity access options-discussed use of compression sleeves to help with venous return 2. End-stage renal disease:  HD done yesterday to continue on a Monday/Wednesday/Friday schedule. Difficulty noted with BP monitoring with cuff over lower limb--will continue efforts at UF. Follow-up with vascular surgery in 2 weeks to follow-up on arm swelling and planning for thigh graft. 3. Anemia: Hemoglobin level is lower today--restart ESA. 4. Hypertension:  Blood pressures on lower side-he remains asymptomatic and will need close monitoring given problems with blood pressure measurements over lower extremity. 5. Metabolic bone disease: Resumed vitamin D receptor analog with dialysis, resume phosphorus binders as oral intake improves  Subjective:   Denies any problems overnight-results from barium swallow noted with appropriate dietary modifications.    Objective:   BP (!) 106/42 (BP Location: Right Leg)   Pulse 96   Temp 98.3 F (36.8 C) (Oral)   Resp 18   Ht 5\' 7"  (1.702 m)   Wt 68.7 kg (151 lb 7.3 oz)   SpO2 97%   BMI 23.72 kg/m   Physical Exam: Gen: Resting comfortably In bed CVS: Pulse regular rhythm, normal rate, S1 and S2 normal Resp: Core/transmitted breath sounds bilaterally, no rales/rhonchi Abd: Soft, obese, nontender Ext: No lower extremity edema, 1-2+ left upper extremity edema and 2-3+ right upper extremity edema with blister noted over the forearm  Labs: BMET  Recent Labs Lab 08/04/16 1620  08/04/16 2155 08/05/16 0808 08/06/16 0552 08/07/16 0212 08/08/16 0835 08/09/16 0453  NA 133* 133* 135 136 137 133* 135  134*  K 5.8* 4.3 4.8 4.7 4.2 4.1 3.5  3.4*  CL 98* 99* 101 98* 101 97* 97*  96*  CO2 25 21* 19* 19* 22 23 24  25   GLUCOSE 339* 301* 215* 170* 129* 192* 158*  163*  BUN 40* 42* 50* 40* 46* 24* 40*  39*  CREATININE 7.50* 7.48* 8.09* 6.96* 8.51* 5.61* 7.06*  7.10*  CALCIUM 8.0* 8.0* 8.2* 8.3* 7.9* 7.9* 7.6*  7.6*  PHOS  --   --   --  5.7*  --   --  6.0*   CBC  Recent Labs Lab 08/06/16 0552 08/07/16 0212 08/08/16 0835 08/09/16 0453  WBC 13.1* 10.8* 10.0 7.6  NEUTROABS  --  9.3*  --   --   HGB 12.4* 10.3* 10.3* 9.2*  HCT 37.4* 31.1* 31.4* 28.5*  MCV 92.1 90.7 91.0 90.8  PLT PLATELET CLUMPS NOTED ON SMEAR, COUNT APPEARS DECREASED 127* 120* 127*   Medications:    . albuterol  2.5 mg Nebulization TID  . darbepoetin (ARANESP) injection - DIALYSIS  60 mcg Intravenous Q Fri-HD  . docusate  100 mg Per Tube BID  . heparin  5,000 Units Subcutaneous Q8H  . insulin aspart  0-15 Units Subcutaneous TID WC  . insulin aspart  0-5 Units Subcutaneous QHS  . RESOURCE THICKENUP CLEAR   Oral Once  . sennosides  5 mL Per Tube BID  . sevelamer carbonate  1.6 g Oral TID WC  . sodium chloride flush  3 mL Intravenous Q12H   Zetta Bills, MD 08/10/2016, 10:32  AM

## 2016-08-11 DIAGNOSIS — E1122 Type 2 diabetes mellitus with diabetic chronic kidney disease: Secondary | ICD-10-CM

## 2016-08-11 DIAGNOSIS — Z794 Long term (current) use of insulin: Secondary | ICD-10-CM

## 2016-08-11 DIAGNOSIS — Z992 Dependence on renal dialysis: Secondary | ICD-10-CM

## 2016-08-11 LAB — GLUCOSE, CAPILLARY
GLUCOSE-CAPILLARY: 173 mg/dL — AB (ref 65–99)
Glucose-Capillary: 300 mg/dL — ABNORMAL HIGH (ref 65–99)

## 2016-08-11 LAB — HEMOGLOBIN A1C
Hgb A1c MFr Bld: 8.8 % — ABNORMAL HIGH (ref 4.8–5.6)
Mean Plasma Glucose: 206 mg/dL

## 2016-08-11 MED ORDER — DARBEPOETIN ALFA 60 MCG/0.3ML IJ SOSY: 60.0000 ug | PREFILLED_SYRINGE | INTRAMUSCULAR | Status: DC

## 2016-08-11 MED ORDER — SEVELAMER CARBONATE 0.8 G PO PACK: 1.6000 g | PACK | Freq: Three times a day (TID) | ORAL | 2 refills | Status: DC

## 2016-08-11 NOTE — Progress Notes (Signed)
CM met with pt in room to offer choice of home health agency. Pt chooses [facility].  Referral called to [place] to schedule HHRN/PT/OT/SW/Aide.  Pt states he has a rolling walker at home and declines bedside commode.  Pt states his girlfriend will provide transportation home.  No other CM needs were communicated.

## 2016-08-11 NOTE — Evaluation (Signed)
Physical Therapy Evaluation Patient Details Name: Charles Vasquez MRN: [id number] DOB: 04/23/1935 Today's Date: 08/11/2016   History of Present Illness  Pt is a 81 yo male admitted thorugh ED on 08/04/16 with retropharyngeal effusion, facial edema, and venous HTN. Pt underwent a fistula ligation on 08/04/16 and was intubated until 08/06/16. PMH significant for ESRD, HTN, DM, MI, stroke.   Clinical Impression  Pt presents with the above diagnosis and below deficits for therapy evaluation. Prior to admission, pt was completely independent and able to drive himself to MD appointments. Pt did, however, take SCAT to dialysis. Pt is able to mobilize with Min guard this session without an AD and supervision with AD. Pt will benefit from using RW once he returns home and from [place] at discharge in order to maximize his functional outcomes. Pt will not require any further acute follow-up at this time. Please re-order if any changes arise.     Follow Up Recommendations Home health PT    Equipment Recommendations  Rolling walker with 5" wheels    Recommendations for Other Services       Precautions / Restrictions Precautions Precautions: Fall Restrictions Weight Bearing Restrictions: No      Mobility  Bed Mobility Overal bed mobility: Modified Independent             General bed mobility comments: OOB in recliner when PT arrives  Transfers Overall transfer level: Needs assistance Equipment used: Rolling walker (2 wheeled) Transfers: Sit to/from Stand Sit to Stand: Min guard         General transfer comment: min guard for safety without an AD  Ambulation/Gait Ambulation/Gait assistance: Min guard;Supervision Ambulation Distance (Feet): 250 Feet Assistive device: Rolling walker (2 wheeled) Gait Pattern/deviations: Step-through pattern;Decreased step length - right;Decreased step length - left;Trunk flexed Gait velocity: decreased Gait velocity interpretation: Below normal speed for  age/gender General Gait Details: minimal forward trunk lean with cues for upright posture and proximity to RW.  Stairs            Wheelchair Mobility    Modified Rankin (Stroke Patients Only)       Balance Overall balance assessment: Needs assistance Sitting-balance support: No upper extremity supported;Feet supported Sitting balance-Leahy Scale: Good     Standing balance support: Bilateral upper extremity supported;No upper extremity supported;During functional activity Standing balance-Leahy Scale: Fair Standing balance comment: able to stand briefly without an AD                             Pertinent Vitals/Pain Pain Assessment: No/denies pain    Home Living Family/patient expects to be discharged to:: Private residence Living Arrangements: Alone Available Help at Discharge: Friend(s);Available PRN/intermittently Type of Home: Apartment Home Access: Level entry     Home Layout: One level Home Equipment: Walker - standard;Shower seat      Prior Function Level of Independence: Independent         Comments: was completely independent and driving prior to admission     Hand Dominance   Dominant Hand: Right    Extremity/Trunk Assessment   Upper Extremity Assessment Upper Extremity Assessment: Overall WFL for tasks assessed    Lower Extremity Assessment Lower Extremity Assessment: Overall WFL for tasks assessed    Cervical / Trunk Assessment Cervical / Trunk Assessment: Normal  Communication   Communication: No difficulties  Cognition Arousal/Alertness: Awake/alert Behavior During Therapy: WFL for tasks assessed/performed Overall Cognitive Status: Within Functional Limits for tasks assessed  General Comments      Exercises     Assessment/Plan    PT Assessment All further PT needs can be met in the next venue of care  PT Problem List Decreased activity tolerance;Decreased  balance;Decreased mobility;Decreased knowledge of use of DME       PT Treatment Interventions      PT Goals (Current goals can be found in the Care Plan section)  Acute Rehab PT Goals Patient Stated Goal: to get home PT Goal Formulation: With patient Time For Goal Achievement: 08/18/16 Potential to Achieve Goals: Good    Frequency     Barriers to discharge        Co-evaluation               AM-PAC PT "6 Clicks" Daily Activity  Outcome Measure Difficulty turning over in bed (including adjusting bedclothes, sheets and blankets)?: None Difficulty moving from lying on back to sitting on the side of the bed? : None Difficulty sitting down on and standing up from a chair with arms (e.g., wheelchair, bedside commode, etc,.)?: A Little Help needed moving to and from a bed to chair (including a wheelchair)?: A Little Help needed walking in hospital room?: A Little Help needed climbing 3-5 steps with a railing? : A Little 6 Click Score: 20    End of Session Equipment Utilized During Treatment: Gait belt Activity Tolerance: Patient tolerated treatment well Patient left: in chair;with call bell/phone within reach;with chair alarm set Nurse Communication: Mobility status PT Visit Diagnosis: Other abnormalities of gait and mobility (R26.89);Difficulty in walking, not elsewhere classified (R26.2)    Time: 0938-1829 PT Time Calculation (min) (ACUTE ONLY): 14 min   Charges:   PT Evaluation $PT Eval Moderate Complexity: 1 Procedure     PT G Codes:        Scheryl Marten PT, DPT  [phone]   Shanon Rosser 08/11/2016, 11:23 AM

## 2016-08-11 NOTE — Discharge Summary (Signed)
Physician Discharge Summary  Charles Vasquez UJW:[id number] DOB: 10/17/1935 DOA: 08/04/2016  PCP: Toma Deiters, MD  Admit date: 08/04/2016 Discharge date: 08/11/2016  Admitted From: Home Discharge disposition: Home   Recommendations for Outpatient Follow-Up:   1. Home health services including RN, PT, OT, social worker, and aide set up. 2. Needs close follow-up of diabetes given hemoglobin A1c of 8.8%.   Discharge Diagnosis:   Principal Problem:   Venous hypertension status post AVG procedure Active Problems:   Diabetes (HCC)   Essential hypertension   GERD   End stage renal disease (HCC)   Facial swelling   Effusion, other site   Exophthalmos   Acute respiratory failure (HCC)   Hyperkalemia  Discharge Condition: Improved.  Diet recommendation: Low sodium, heart healthy.  Carbohydrate-modified.  Renal.  Wound care: None.   History of Present Illness:   Charles Vasquez is an 81 y.o. male with a PMH of ESRD on HD, hypertension, diabetes, history of MI, history of stroke who was admitted 08/04/16 for evaluation of progressive facial and bilateral upper extremity, left greater than right, swelling after recent left AV fistula creation. Was taken to the OR for left fistula ligation 08/04/16, and remained intubated postoperatively, managed by PCCM. He was extubated 08/06/16. Care transferred to [facility] 08/10/16.  Hospital Course by Problem:   Principal Problem:   Venous hypertension status post AVG procedure/Facial swelling Status post emergent ligation of the left upper arm AVG. Swelling improved.  Active Problems:   Diabetes (HCC) Hemoglobin A1c 8.8%. Managed with moderate scale SSI while in the hospital. Resume Lantus at discharge. Will need close outpatient follow-up for evaluation of glycemic control.    Essential hypertension Continue Cozaar.    GERD    End stage renal disease (HCC) Resume HD M/W/F.    Acute respiratory failure (HCC) Extubated 08/06/16.  Respiratory status stable.    Hyperkalemia Resolved.    Anemia of chronic kidney disease Aranesp per nephrology.    Metabolic bone disease Continue Renvela.  Medical Consultants:    Nephrology  CVTS   Discharge Exam:   Vitals:   08/10/16 2103 08/11/16 0414  BP: (!) 110/33 (!) 143/42  Pulse: (!) 43 83  Resp: 16 15  Temp: 99 F (37.2 C) 99.5 F (37.5 C)   Vitals:   08/10/16 0927 08/10/16 1842 08/10/16 2103 08/11/16 0414  BP: (!) 106/42 (!) 149/38 (!) 110/33 (!) 143/42  Pulse: 96 86 (!) 43 83  Resp: 18 18 16 15   Temp: 98.3 F (36.8 C) 98.1 F (36.7 C) 99 F (37.2 C) 99.5 F (37.5 C)  TempSrc: Oral Oral    SpO2: 97% 100% 100% 100%  Weight:      Height:       General exam: Sitting up in chair, calm and comfortable. Respiratory system: Lungs are clear with normal respiratory effort. Cardiovascular system: Regular rate, rhythm. No murmurs, rubs, or gallops.   Gastrointestinal system: Abdomen is soft, nontender, nondistended with normal active bowel sounds. No masses. Central nervous system: Alert and oriented, nonfocal. Extremities: Upper extremity swelling persists, but improved. Venous stasis/discolored lower extremities. Skin: Warm and dry, no rashes. Psychiatry: Affect bright. Insight/judgment fair.   The results of significant diagnostics from this hospitalization (including imaging, microbiology, ancillary and laboratory) are listed below for reference.     Procedures and Diagnostic Studies:   Dg Chest Port 1 View  Result Date: 08/05/2016 CLINICAL DATA:  Endotracheal tube placement.  Initial encounter. EXAM: PORTABLE CHEST 1 VIEW COMPARISON:  Chest radiograph performed 08/04/2016 FINDINGS: The patient's endotracheal tube is seen ending 4 cm above the carina. A right-sided dual-lumen catheter is noted ending about the cavoatrial junction. Left basilar airspace opacity is mildly worsened and raises concern for pneumonia. No pleural effusion or  pneumothorax is seen. The cardiomediastinal silhouette is borderline normal in size. No acute osseous abnormalities are seen. IMPRESSION: 1. Endotracheal tube seen ending 4 cm above the carina. 2. Left basilar airspace opacity is mildly worsened and raises concern for pneumonia. Electronically Signed   By: Roanna Raider M.D.   On: 08/05/2016 01:16     Labs:   Basic Metabolic Panel:  Recent Labs Lab 08/05/16 0808 08/06/16 0552 08/07/16 0212 08/08/16 0835 08/09/16 0453  NA 135 136 137 133* 135  134*  K 4.8 4.7 4.2 4.1 3.5  3.4*  CL 101 98* 101 97* 97*  96*  CO2 19* 19* 22 23 24  25   GLUCOSE 215* 170* 129* 192* 158*  163*  BUN 50* 40* 46* 24* 40*  39*  CREATININE 8.09* 6.96* 8.51* 5.61* 7.06*  7.10*  CALCIUM 8.2* 8.3* 7.9* 7.9* 7.6*  7.6*  MG  --  1.8  --   --   --   PHOS  --  5.7*  --   --  6.0*   GFR Estimated Creatinine Clearance: 8 mL/min (A) (by C-G formula based on SCr of 7.1 mg/dL (H)). Liver Function Tests:  Recent Labs Lab 08/09/16 0453  ALBUMIN 2.5*   CBC:  Recent Labs Lab 08/05/16 0808 08/06/16 0552 08/07/16 0212 08/08/16 0835 08/09/16 0453  WBC 11.3* 13.1* 10.8* 10.0 7.6  NEUTROABS  --   --  9.3*  --   --   HGB 10.0* 12.4* 10.3* 10.3* 9.2*  HCT 30.2* 37.4* 31.1* 31.4* 28.5*  MCV 91.0 92.1 90.7 91.0 90.8  PLT 133* PLATELET CLUMPS NOTED ON SMEAR, COUNT APPEARS DECREASED 127* 120* 127*   CBG:  Recent Labs Lab 08/10/16 1214 08/10/16 1632 08/10/16 2101 08/11/16 0753 08/11/16 1223  GLUCAP 271* 306* 141* 173* 300*   Hgb A1c  Recent Labs  08/10/16 1500  HGBA1C 8.8*   Microbiology Recent Results (from the past 240 hour(s))  MRSA PCR Screening     Status: None   Collection Time: 08/04/16  4:32 PM  Result Value Ref Range Status   MRSA by PCR NEGATIVE NEGATIVE Final    Comment:        The GeneXpert MRSA Assay (FDA approved for NASAL specimens only), is one component of a comprehensive MRSA colonization surveillance program. It is  not intended to diagnose MRSA infection nor to guide or monitor treatment for MRSA infections.   Culture, respiratory (NON-Expectorated)     Status: None   Collection Time: 08/04/16 11:59 PM  Result Value Ref Range Status   Specimen Description TRACHEAL ASPIRATE  Final   Special Requests NONE  Final   Gram Stain   Final    RARE WBC PRESENT, PREDOMINANTLY PMN FEW GRAM NEGATIVE RODS FEW GRAM NEGATIVE DIPLOCOCCI RARE GRAM POSITIVE COCCI IN CLUSTERS    Culture Consistent with normal respiratory flora.  Final   Report Status 08/07/2016 FINAL  Final  Culture, blood (routine x 2)     Status: None   Collection Time: 08/05/16 12:04 AM  Result Value Ref Range Status   Specimen Description BLOOD RIGHT HAND  Final   Special Requests IN PEDIATRIC BOTTLE Blood Culture adequate volume  Final   Culture NO GROWTH 5 DAYS  Final  Report Status 08/10/2016 FINAL  Final  Culture, blood (routine x 2)     Status: None   Collection Time: 08/05/16 10:32 AM  Result Value Ref Range Status   Specimen Description BLOOD RIGHT HAND  Final   Special Requests IN PEDIATRIC BOTTLE Blood Culture adequate volume  Final   Culture NO GROWTH 5 DAYS  Final   Report Status 08/10/2016 FINAL  Final     Discharge Instructions:   Discharge Instructions    Call MD for:  difficulty breathing, headache or visual disturbances    Complete by:  As directed    Call MD for:  extreme fatigue    Complete by:  As directed    Call MD for:  severe uncontrolled pain    Complete by:  As directed    Diet - low sodium heart healthy    Complete by:  As directed    Renal   Diet Carb Modified    Complete by:  As directed    Discharge wound care:    Complete by:  As directed    Wash left arm incisions daily with soap and water. Wrap your arm with an ACE wrap to provide compression for your swelling. If you have any drainage from your incisions, apply dry gauze to incisions underneath ACE wrap. Clear or pink/red drainage is normal.  Please call Dr. Nicky Pugh office if you have any pus, foul odor or other abnormal drainage from your incisions.  Keep left arm elevated to reduce swelling.   Increase activity slowly    Complete by:  As directed      Allergies as of 08/11/2016      Reactions   Lisinopril Other (See Comments)   REACTION: hyperkalemia/renal deterioration      Medication List    STOP taking these medications   multivitamin Tabs tablet   oxyCODONE-acetaminophen 5-325 MG tablet Commonly known as:  ROXICET     TAKE these medications   calcium carbonate 750 MG chewable tablet Commonly known as:  TUMS EX Chew 1 tablet by mouth 2 (two) times daily as needed for heartburn.   Darbepoetin Alfa 60 MCG/0.3ML Sosy injection Commonly known as:  ARANESP Inject 0.3 mLs (60 mcg total) into the vein every Friday with hemodialysis. Start taking on:  08/16/2016   insulin glargine 100 UNIT/ML injection Commonly known as:  LANTUS Inject 5 Units into the skin at bedtime. Per BS   latanoprost 0.005 % ophthalmic solution Commonly known as:  XALATAN Place 1 drop into both eyes at bedtime.   losartan 100 MG tablet Commonly known as:  COZAAR Take 100 mg by mouth daily.   sevelamer carbonate 0.8 g Pack packet Commonly known as:  RENVELA Take 1.6 g by mouth 3 (three) times daily with meals.   valACYclovir 1000 MG tablet Commonly known as:  VALTREX Take 1,000 mg by mouth daily.      Follow-up Information    [name], MD Follow up in 2 week(s).   Specialties:  Vascular Surgery, Cardiology Why:  Our office will call you to arrange an appointment (sent) Contact information: [address] [place] [address] [phone]        Salomon Mast, MD. Go on 08/12/2016.   Specialty:  Nephrology Why:  Dialysis center every M,W,F for HD. Contact information: [address] [place] [address] [phone]        [facility] Follow up.   Specialty:  Home Health Services Why:  home health  agency will call you to schedule your at home care Contact information: [address] [place] [address] [place] [address] [phone]            Time coordinating discharge: 35 minutes.  Signed:  RAMA,Charles Vasquez  Pager [phone] [facility] 08/11/2016, 4:26 PM

## 2016-08-11 NOTE — Progress Notes (Signed)
Charles Vasquez to be D/C'd Home per MD order.  Discussed prescriptions and follow up appointments with the patient. Prescriptions given to patient, medication list explained in detail. Pt verbalized understanding.  Allergies as of 08/11/2016      Reactions   Lisinopril Other (See Comments)   REACTION: hyperkalemia/renal deterioration      Medication List    STOP taking these medications   multivitamin Tabs tablet   oxyCODONE-acetaminophen 5-325 MG tablet Commonly known as:  ROXICET     TAKE these medications   calcium carbonate 750 MG chewable tablet Commonly known as:  TUMS EX Chew 1 tablet by mouth 2 (two) times daily as needed for heartburn.   Darbepoetin Alfa 60 MCG/0.3ML Sosy injection Commonly known as:  ARANESP Inject 0.3 mLs (60 mcg total) into the vein every Friday with hemodialysis. Start taking on:  08/16/2016   insulin glargine 100 UNIT/ML injection Commonly known as:  LANTUS Inject 5 Units into the skin at bedtime. Per BS   latanoprost 0.005 % ophthalmic solution Commonly known as:  XALATAN Place 1 drop into both eyes at bedtime.   losartan 100 MG tablet Commonly known as:  COZAAR Take 100 mg by mouth daily.   sevelamer carbonate 0.8 g Pack packet Commonly known as:  RENVELA Take 1.6 g by mouth 3 (three) times daily with meals.   valACYclovir 1000 MG tablet Commonly known as:  VALTREX Take 1,000 mg by mouth daily.       Vitals:   08/10/16 2103 08/11/16 0414  BP: (!) 110/33 (!) 143/42  Pulse: (!) 43 83  Resp: 16 15  Temp: 99 F (37.2 C) 99.5 F (37.5 C)    Skin clean, dry and intact without evidence of skin break down, no evidence of skin tears noted. IV catheter discontinued intact. Site without signs and symptoms of complications. Dressing and pressure applied. Pt denies pain at this time. No complaints noted.  An After Visit Summary was printed and given to the patient. Patient escorted via WC, and D/C home via private auto.  Mariann Barter  BSN, RN [facility] Phone [phone]

## 2016-08-12 NOTE — Addendum Note (Signed)
Addendum  created 08/12/16 1521 by Edmund Holcomb, MD   Sign clinical note    

## 2016-08-23 ENCOUNTER — Encounter: Payer: Self-pay | Admitting: Vascular Surgery

## 2016-09-05 NOTE — Progress Notes (Signed)
    Postoperative Access Visit   History of Present Illness   Charles Vasquez is a 81 y.o. year old male who presents for postoperative follow-up for: left upper arm arteriovenous graft (Date: 07/23/16) complicated by SVC syndrome leading to LUA AVG ligation (08/04/16).  The patient's wounds are healed.  The patient is able to complete their activities of daily living.  The patient's current symptoms are: R arm swelling.   Physical Examination   Vitals:   09/06/16 1511  Pulse: 70  Resp: 20  Temp: 100 F (37.8 C)  TempSrc: Oral  SpO2: 93%  Weight: 144 lb 8 oz (65.5 kg)  Height: 5\' 7"  (1.702 m)    Neck: no swelling B Chest: R chest vein evident LUE: Incisions are healed, skin feels warm, hand grip is 5/5, sensation in digits is intact, nopalpable thrill, bruit can not be auscultated, no edema RUE: edema 1+   Medical Decision Making   Charles Vasquez is a 81 y.o. 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  Will get BLE ABI and aortoiliac duplex to evaluate his inflow to his feet.  The patient has expressed concerns with placement of any thigh AVG but is willing to reconsider at this time.  Will have the patient follow up in 4 weeks with the above studies.  Thank you for allowing [place] to participate in this patient's care.   [name], MD, FACS [facility] Office: [phone] Pager: [phone]

## 2016-09-06 ENCOUNTER — Encounter: Payer: Self-pay | Admitting: Vascular Surgery

## 2016-09-06 ENCOUNTER — Ambulatory Visit (INDEPENDENT_AMBULATORY_CARE_PROVIDER_SITE_OTHER): Payer: Self-pay | Admitting: Vascular Surgery

## 2016-09-06 VITALS — HR 70 | Temp 100.0°F | Resp 20 | Ht 67.0 in | Wt 144.5 lb

## 2016-09-06 DIAGNOSIS — N186 End stage renal disease: Secondary | ICD-10-CM

## 2016-09-06 DIAGNOSIS — I87303 Chronic venous hypertension (idiopathic) without complications of bilateral lower extremity: Secondary | ICD-10-CM

## 2016-09-17 NOTE — Addendum Note (Signed)
Addended by: Burton Apley A on: 09/17/2016 11:00 AM   Modules accepted: Orders

## 2016-09-25 ENCOUNTER — Encounter: Payer: Self-pay | Admitting: Family

## 2016-09-26 ENCOUNTER — Other Ambulatory Visit: Payer: Self-pay

## 2016-09-26 ENCOUNTER — Encounter: Payer: Self-pay | Admitting: Family

## 2016-09-26 ENCOUNTER — Ambulatory Visit (INDEPENDENT_AMBULATORY_CARE_PROVIDER_SITE_OTHER): Payer: Self-pay | Admitting: Family

## 2016-09-26 VITALS — BP 157/83 | HR 72 | Temp 97.8°F | Resp 18 | Ht 67.0 in | Wt 143.0 lb

## 2016-09-26 DIAGNOSIS — N186 End stage renal disease: Secondary | ICD-10-CM

## 2016-09-26 DIAGNOSIS — Z992 Dependence on renal dialysis: Secondary | ICD-10-CM

## 2016-09-26 NOTE — Progress Notes (Signed)
    Postoperative Access Visit   History of Present Illness  Charles Vasquez is a 80 y.o. 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 Chen.  The patient's wounds are healed.  The patient is able to complete their activities of daily living.  The patient's current symptoms are: R arm swelling. Dr. Chen last evaluated pt on 09-06-16. 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  Will get BLE ABI and aortoiliac duplex to evaluate his inflow to his feet.  The patient has expressed concerns with placement of any thigh AVG but is willing to reconsider at this time.  Will have the patient follow up in 4 weeks with the above studies.  Pt has an appointment on 10-07-16 for these studies, and on 10-18-16 with Dr. Chen.   Pt returns today with c/o swelling in right upper extremity that is worsening, right 2nd finger is getting darker, decreased sensation in right upper extremity.  He dialyzes vis right upper chest catheter on M-W-F.    For VQI Use Only  PRE-ADM LIVING: Home  AMB STATUS: Ambulatory  Physical Examination Vitals:   09/26/16 1005  BP: (!) 157/83  Pulse: 72  Resp: 18  Temp: 97.8 F (36.6 C)  TempSrc: Oral  SpO2: 98%  Weight: 143 lb (64.9 kg)  Height: 5' 7" (1.702 m)   Body mass index is 22.4 kg/m.   + Doppler signal right radial artery. Right second finger is dark and mottled. Right upper chest HD catheter in place.  Right arm and hand with 2+ non pitting edema.  Skin feels warm, hand grip is 3/5, sensation in right digits is diminished, no palpable thrill, bruit can not be auscultated    Medical Decision Making  Charles Vasquez is a 80 y.o. 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 He has worsening right arm and hand swelling with numbness and mottling/darkening of right 2nd finger; + Doppler signal right radial pulse.  Dr. Fields spoke with pt and male friend with him and examined pt. Will schedule for removal of right upper chest catheter and insertion of right femoral HD catheter for 10-01-16 by Dr. Chen. By removing the right upper chest catheter, hopefully the right arm swelling will improve.    Thank you for allowing us to participate in this patient's care.  Epifanio Labrador L, RN, MSN, FNP-C [facility] Office: [phone]  09/26/2016, 6:14 PM  Clinic MD: Fields   

## 2016-09-27 ENCOUNTER — Ambulatory Visit: Payer: Medicare Other | Admitting: Family

## 2016-09-30 ENCOUNTER — Encounter (HOSPITAL_COMMUNITY): Payer: Self-pay | Admitting: *Deleted

## 2016-09-30 MED ORDER — DEXTROSE 5 % IV SOLN
1.5000 g | INTRAVENOUS | Status: AC
  Administered 2016-10-01: 1.5 g via INTRAVENOUS
  Filled 2016-09-30: qty 1.5

## 2016-09-30 NOTE — Progress Notes (Signed)
Spoke with pt's friend, Silvestre Waddy for pre-op call. Lamar Blinks RN had called and told me that Alfonse Ras is who I would need to speak to because Mr. Fanjoy defers to her when she comes with him for his appt. She is not sure of his medical history though. Instructed her to have Mr. Muscato to take 1/2 of his Lantus Insulin tonight (2 units) and to check his blood sugar tomorrow in the AM when he gets up and every 2 hours until he leaves for the hospital. If blood sugar is 70 or below, treat with 1/2 cup of clear juice (apple or cranberry) and recheck blood sugar 15 minutes after drinking juice. If blood sugar continues to be 70 or below, call the Short Stay department and ask to speak to a nurse. She voiced understanding, she states he will only check one time.

## 2016-10-01 ENCOUNTER — Ambulatory Visit (HOSPITAL_COMMUNITY): Payer: Medicare Other

## 2016-10-01 ENCOUNTER — Ambulatory Visit (HOSPITAL_COMMUNITY): Payer: Medicare Other | Admitting: Certified Registered"

## 2016-10-01 ENCOUNTER — Encounter (HOSPITAL_COMMUNITY)
Admission: RE | Disposition: A | Discharge status: Home / Self Care | Payer: Self-pay | Source: Ambulatory Visit | Attending: Vascular Surgery

## 2016-10-01 ENCOUNTER — Encounter (HOSPITAL_COMMUNITY): Payer: Self-pay | Admitting: General Practice

## 2016-10-01 ENCOUNTER — Ambulatory Visit (HOSPITAL_COMMUNITY)
Admission: RE | Admit: 2016-10-01 | Discharge: 2016-10-01 | Disposition: A | Discharge status: Home / Self Care | Payer: Medicare Other | Source: Ambulatory Visit | Attending: Vascular Surgery | Admitting: Vascular Surgery

## 2016-10-01 ENCOUNTER — Encounter: Payer: Self-pay | Admitting: Vascular Surgery

## 2016-10-01 DIAGNOSIS — T82858A Stenosis of vascular prosthetic devices, implants and grafts, initial encounter: Secondary | ICD-10-CM | POA: Insufficient documentation

## 2016-10-01 DIAGNOSIS — I252 Old myocardial infarction: Secondary | ICD-10-CM | POA: Diagnosis not present

## 2016-10-01 DIAGNOSIS — E1122 Type 2 diabetes mellitus with diabetic chronic kidney disease: Secondary | ICD-10-CM | POA: Diagnosis not present

## 2016-10-01 DIAGNOSIS — Z419 Encounter for procedure for purposes other than remedying health state, unspecified: Secondary | ICD-10-CM

## 2016-10-01 DIAGNOSIS — K219 Gastro-esophageal reflux disease without esophagitis: Secondary | ICD-10-CM | POA: Insufficient documentation

## 2016-10-01 DIAGNOSIS — I871 Compression of vein: Secondary | ICD-10-CM | POA: Insufficient documentation

## 2016-10-01 DIAGNOSIS — Y832 Surgical operation with anastomosis, bypass or graft as the cause of abnormal reaction of the patient, or of later complication, without mention of misadventure at the time of the procedure: Secondary | ICD-10-CM | POA: Insufficient documentation

## 2016-10-01 DIAGNOSIS — Z992 Dependence on renal dialysis: Secondary | ICD-10-CM

## 2016-10-01 DIAGNOSIS — T82898A Other specified complication of vascular prosthetic devices, implants and grafts, initial encounter: Secondary | ICD-10-CM | POA: Diagnosis not present

## 2016-10-01 DIAGNOSIS — M1990 Unspecified osteoarthritis, unspecified site: Secondary | ICD-10-CM | POA: Diagnosis not present

## 2016-10-01 DIAGNOSIS — I129 Hypertensive chronic kidney disease with stage 1 through stage 4 chronic kidney disease, or unspecified chronic kidney disease: Secondary | ICD-10-CM | POA: Diagnosis not present

## 2016-10-01 DIAGNOSIS — N186 End stage renal disease: Secondary | ICD-10-CM | POA: Insufficient documentation

## 2016-10-01 DIAGNOSIS — Z8673 Personal history of transient ischemic attack (TIA), and cerebral infarction without residual deficits: Secondary | ICD-10-CM | POA: Diagnosis not present

## 2016-10-01 DIAGNOSIS — Z452 Encounter for adjustment and management of vascular access device: Secondary | ICD-10-CM | POA: Diagnosis present

## 2016-10-01 DIAGNOSIS — Z794 Long term (current) use of insulin: Secondary | ICD-10-CM | POA: Diagnosis not present

## 2016-10-01 HISTORY — PX: INSERTION OF DIALYSIS CATHETER: SHX1324

## 2016-10-01 HISTORY — PX: REMOVAL OF A DIALYSIS CATHETER: SHX6053

## 2016-10-01 LAB — POCT I-STAT 4, (NA,K, GLUC, HGB,HCT)
Glucose, Bld: 235 mg/dL — ABNORMAL HIGH (ref 65–99)
HCT: 33 % — ABNORMAL LOW (ref 39.0–52.0)
HEMOGLOBIN: 11.2 g/dL — AB (ref 13.0–17.0)
Potassium: 4.2 mmol/L (ref 3.5–5.1)
Sodium: 136 mmol/L (ref 135–145)

## 2016-10-01 LAB — GLUCOSE, CAPILLARY
GLUCOSE-CAPILLARY: 227 mg/dL — AB (ref 65–99)
Glucose-Capillary: 194 mg/dL — ABNORMAL HIGH (ref 65–99)

## 2016-10-01 SURGERY — REMOVAL, DIALYSIS CATHETER
Anesthesia: Monitor Anesthesia Care | Site: Groin | Laterality: Right

## 2016-10-01 MED ORDER — PROPOFOL 10 MG/ML IV BOLUS
INTRAVENOUS | Status: AC
  Filled 2016-10-01: qty 20

## 2016-10-01 MED ORDER — LIDOCAINE HCL (PF) 1 % IJ SOLN
INTRAMUSCULAR | Status: AC
  Filled 2016-10-01: qty 30

## 2016-10-01 MED ORDER — CHLORHEXIDINE GLUCONATE CLOTH 2 % EX PADS: 6.0000 | MEDICATED_PAD | Freq: Once | CUTANEOUS | Status: DC

## 2016-10-01 MED ORDER — ONDANSETRON HCL 4 MG/2ML IJ SOLN
INTRAMUSCULAR | Status: DC | PRN
  Administered 2016-10-01: 4 mg via INTRAVENOUS

## 2016-10-01 MED ORDER — LIDOCAINE 2% (20 MG/ML) 5 ML SYRINGE
INTRAMUSCULAR | Status: AC
  Filled 2016-10-01: qty 5

## 2016-10-01 MED ORDER — ONDANSETRON HCL 4 MG/2ML IJ SOLN: 4.0000 mg | Freq: Once | INTRAMUSCULAR | Status: DC | PRN

## 2016-10-01 MED ORDER — HEPARIN SODIUM (PORCINE) 1000 UNIT/ML IJ SOLN
INTRAMUSCULAR | Status: DC | PRN
  Administered 2016-10-01: 1000 [IU]

## 2016-10-01 MED ORDER — PROPOFOL 500 MG/50ML IV EMUL
INTRAVENOUS | Status: DC | PRN
  Administered 2016-10-01: 50 ug/kg/min via INTRAVENOUS

## 2016-10-01 MED ORDER — HEPARIN SODIUM (PORCINE) 1000 UNIT/ML IJ SOLN
INTRAMUSCULAR | Status: AC
  Filled 2016-10-01: qty 1

## 2016-10-01 MED ORDER — FENTANYL CITRATE (PF) 100 MCG/2ML IJ SOLN: 25.0000 ug | INTRAMUSCULAR | Status: DC | PRN

## 2016-10-01 MED ORDER — HEPARIN SODIUM (PORCINE) 5000 UNIT/ML IJ SOLN
INTRAMUSCULAR | Status: DC | PRN
  Administered 2016-10-01: 11:00:00

## 2016-10-01 MED ORDER — ONDANSETRON HCL 4 MG/2ML IJ SOLN
INTRAMUSCULAR | Status: AC
  Filled 2016-10-01: qty 2

## 2016-10-01 MED ORDER — SODIUM CHLORIDE 0.9 % IV SOLN
INTRAVENOUS | Status: DC
Start: 2016-10-01 — End: 2016-10-01
  Administered 2016-10-01: 10:00:00 via INTRAVENOUS

## 2016-10-01 MED ORDER — PROPOFOL 1000 MG/100ML IV EMUL
INTRAVENOUS | Status: AC
  Filled 2016-10-01: qty 100

## 2016-10-01 MED ORDER — 0.9 % SODIUM CHLORIDE (POUR BTL) OPTIME
TOPICAL | Status: DC | PRN
  Administered 2016-10-01: 1000 mL

## 2016-10-01 MED ORDER — FENTANYL CITRATE (PF) 250 MCG/5ML IJ SOLN
INTRAMUSCULAR | Status: AC
  Filled 2016-10-01: qty 5

## 2016-10-01 MED ORDER — FENTANYL CITRATE (PF) 100 MCG/2ML IJ SOLN
INTRAMUSCULAR | Status: DC | PRN
  Administered 2016-10-01: 25 ug via INTRAVENOUS

## 2016-10-01 MED ORDER — LIDOCAINE HCL (CARDIAC) 20 MG/ML IV SOLN
INTRAVENOUS | Status: DC | PRN
  Administered 2016-10-01: 40 mg via INTRATRACHEAL

## 2016-10-01 MED ORDER — SODIUM CHLORIDE 0.9 % IV SOLN: INTRAVENOUS | Status: DC

## 2016-10-01 MED ORDER — LIDOCAINE-EPINEPHRINE (PF) 1 %-1:200000 IJ SOLN
INTRAMUSCULAR | Status: DC | PRN
  Administered 2016-10-01: 30 mL

## 2016-10-01 SURGICAL SUPPLY — 36 items
BAG DECANTER FOR FLEXI CONT (MISCELLANEOUS) ×4 IMPLANT
BANDAGE ACE 4X5 VEL STRL LF (GAUZE/BANDAGES/DRESSINGS) ×4 IMPLANT
BIOPATCH RED 1 DISK 7.0 (GAUZE/BANDAGES/DRESSINGS) ×3 IMPLANT
BIOPATCH RED 1IN DISK 7.0MM (GAUZE/BANDAGES/DRESSINGS) ×1
CATH PALINDROME RT-P 15FX55CM (CATHETERS) ×4 IMPLANT
COVER PROBE W GEL 5X96 (DRAPES) ×4 IMPLANT
COVER SURGICAL LIGHT HANDLE (MISCELLANEOUS) ×4 IMPLANT
DERMABOND ADVANCED (GAUZE/BANDAGES/DRESSINGS) ×2
DERMABOND ADVANCED .7 DNX12 (GAUZE/BANDAGES/DRESSINGS) ×2 IMPLANT
DRAPE C-ARM 42X72 X-RAY (DRAPES) ×4 IMPLANT
DRAPE CHEST BREAST 15X10 FENES (DRAPES) ×4 IMPLANT
GAUZE SPONGE 4X4 16PLY XRAY LF (GAUZE/BANDAGES/DRESSINGS) ×4 IMPLANT
GLOVE BIO SURGEON STRL SZ7 (GLOVE) ×4 IMPLANT
GLOVE BIOGEL PI IND STRL 7.5 (GLOVE) ×2 IMPLANT
GLOVE BIOGEL PI INDICATOR 7.5 (GLOVE) ×2
GOWN STRL REUS W/ TWL LRG LVL3 (GOWN DISPOSABLE) ×4 IMPLANT
GOWN STRL REUS W/TWL LRG LVL3 (GOWN DISPOSABLE) ×4
KIT BASIN OR (CUSTOM PROCEDURE TRAY) ×4 IMPLANT
KIT ROOM TURNOVER OR (KITS) ×4 IMPLANT
NEEDLE 18GX1X1/2 (RX/OR ONLY) (NEEDLE) ×4 IMPLANT
NEEDLE HYPO 25GX1X1/2 BEV (NEEDLE) ×4 IMPLANT
NS IRRIG 1000ML POUR BTL (IV SOLUTION) ×4 IMPLANT
PACK SURGICAL SETUP 50X90 (CUSTOM PROCEDURE TRAY) ×4 IMPLANT
PAD ARMBOARD 7.5X6 YLW CONV (MISCELLANEOUS) ×8 IMPLANT
SET MICROPUNCTURE 5F STIFF (MISCELLANEOUS) IMPLANT
SOAP 2 % CHG 4 OZ (WOUND CARE) ×4 IMPLANT
SUT ETHILON 3 0 PS 1 (SUTURE) ×4 IMPLANT
SUT MNCRL AB 4-0 PS2 18 (SUTURE) ×4 IMPLANT
SYR 10ML LL (SYRINGE) ×4 IMPLANT
SYR 20CC LL (SYRINGE) ×8 IMPLANT
SYR 3ML LL SCALE MARK (SYRINGE) ×4 IMPLANT
SYR 5ML LL (SYRINGE) ×4 IMPLANT
SYR CONTROL 10ML LL (SYRINGE) ×4 IMPLANT
TOWEL OR 17X24 6PK STRL BLUE (TOWEL DISPOSABLE) ×4 IMPLANT
TOWEL OR 17X26 4PK STRL BLUE (TOWEL DISPOSABLE) ×4 IMPLANT
WATER STERILE IRR 1000ML POUR (IV SOLUTION) ×4 IMPLANT

## 2016-10-01 NOTE — Transfer of Care (Signed)
Immediate Anesthesia Transfer of Care Note  Patient: Charles Vasquez  Procedure(s) Performed: Procedure(s): REMOVAL OF A DIALYSIS CATHETER-RIGHT UPPER CHEST (Right) INSERTION OF DIALYSIS CATHETER- RIGHT FEMORAL (Right)  Patient Location: PACU  Anesthesia Type:MAC  Level of Consciousness: awake and oriented  Airway & Oxygen Therapy: Patient Spontanous Breathing  Post-op Assessment: Report given to RN  Post vital signs: Reviewed and stable  Last Vitals:  Vitals:   10/01/16 0858  BP: (!) 150/57  Pulse: 75  Resp: 18  Temp: 36.9 C    Last Pain:  Vitals:   10/01/16 0927  TempSrc:   PainSc: 9       Patients Stated Pain Goal: 3 (09/98/33 8250)  Complications: No apparent anesthesia complications

## 2016-10-01 NOTE — Interval H&P Note (Signed)
History and Physical Interval Note:  10/01/2016 9:43 AM  Charles Vasquez  has presented today for surgery, with the diagnosis of Right Upper Extremity Swelling  M79.89 Mottling of Skin Right Upper Extremity  L81.9  The various methods of treatment have been discussed with the patient and family. After consideration of risks, benefits and other options for treatment, the patient has consented to  Procedure(s): REMOVAL OF A DIALYSIS CATHETER-RIGHT UPPER CHEST (Right) INSERTION OF DIALYSIS CATHETER- RIGHT FEMORAL (Right) as a surgical intervention .  The patient's history has been reviewed, patient examined, no change in status, stable for surgery.  I have reviewed the patient's chart and labs.  Questions were answered to the patient's satisfaction.     [name]

## 2016-10-01 NOTE — Anesthesia Postprocedure Evaluation (Signed)
Anesthesia Post Note  Patient: Charles Vasquez  Procedure(s) Performed: Procedure(s) (LRB): REMOVAL OF A DIALYSIS CATHETER-RIGHT UPPER CHEST (Right) INSERTION OF DIALYSIS CATHETER- RIGHT FEMORAL (Right)     Patient location during evaluation: PACU Anesthesia Type: MAC Level of consciousness: awake and alert Pain management: pain level controlled Vital Signs Assessment: post-procedure vital signs reviewed and stable Respiratory status: spontaneous breathing, nonlabored ventilation, respiratory function stable and patient connected to nasal cannula oxygen Cardiovascular status: stable and blood pressure returned to baseline Anesthetic complications: no    Last Vitals:  Vitals:   10/01/16 1155 10/01/16 1203  BP: (!) 176/80 (!) 157/98  Pulse: 67 70  Resp: 10 19  Temp: (!) 36.3 C (!) 36.3 C    Last Pain:  Vitals:   10/01/16 1203  TempSrc:   PainSc: 0-No pain                 Catalina Gravel

## 2016-10-01 NOTE — Op Note (Signed)
OPERATIVE NOTE  PROCEDURE: 1. right femoral vein tunneled dialysis catheter placement 2. right femoral vein cannulation under ultrasound guidance 3. Right internal jugular vein tunneled dialysis catheter removal  PRE-OPERATIVE DIAGNOSIS: hemodialysis dependence  POST-OPERATIVE DIAGNOSIS: same as above  SURGEON: Adele Barthel, MD  ANESTHESIA: local and MAC  ESTIMATED BLOOD LOSS: 30 cc  FINDING(S): 1.  Tips of the catheter in the right atrium on fluoroscopy  SPECIMEN(S):  none  INDICATIONS:   Charles Vasquez is a 81 y.o. male who  presents with hemodialysis dependence.  The patient presents for tunneled dialysis catheter placement.  The patient is aware the risks of tunneled dialysis catheter placement include but are not limited to: bleeding, infection, central venous injury, pneumothorax, possible venous stenosis, possible malpositioning in the venous system, and possible infections related to long-term catheter presence.  The patient was aware of these risks and agreed to proceed.   DESCRIPTION: After written full informed consent was obtained from the patient, the patient was taken back to the operating room.  Prior to induction, the patient was given IV antibiotics.  After obtaining adequate sedation, the patient was prepped and draped in the standard fashion for a femoral vein tunneled dialysis catheter placement.    The cannulation site, the catheter exit site, and tract for the subcutaneous tunnel were then anesthesized with a total of 20 cc of 1% lidocaine without epinephrine.  Under ultrasound guidance, the right femoral vein was cannulated with a 18 gauge needle.  A J-wire was then placed into the right atrium under fluroscopic guidance.  The wire was then secured in place with a clamp to the drapes.    I then made stab incisions at the cannulation and exit sites.  I dissected from the exit site to the cannulation site with a metal dissector.  The wire was then unclamped and  I removed the needle.  The skin tract and venotomy was dilated serially with graduated dilators.  Finally, the dilator-sheath was placed under fluroscopic guidance into the iliac vein.  The dilator and wire were removed.    A 55 cm Palindrome catheter was placed under fluoroscopic guidance into the right atrium.  The sheath was broken and peeled away while holding the catheter cuff at the level of the skin.  The catheter was clamped with the plastic clamp.  The back of the catheter was transected revealing the two lumen.  The dissector was docked onto one of these lumens.  The dissector with the back end of this catheter was pulled through the subcutaneous tunnel.  The catheter was again clamped and the back end of the catheter was transected again to reveal the two lumens again.  The catheter collar was placed over the catheter and then two ports loaded onto the two lumens.  The catheter collar was snapped into place, securing the two ports.  The catheter was pulled into appropriate position under fluoroscopy.  Each port was tested by aspirating and flushing.  No resistance was noted.  Each port was then thoroughly flushed with heparinized saline.  The catheter was secured in placed with two interrupted stitches of 3-0 Nylon tied to the catheter.  The cannulation incision was closed with a U-stitch of 4-0 Monocryl.  The cannulation and exit incisions were cleaned and sterile bandages applied.  Each port was then loaded with concentrated heparin (1000 Units/mL) at the manufacturer recommended volumes to each port.  Sterile caps were applied to each port.    On completion fluoroscopy,  the tips of the catheter were in the right atrium, and there was no evidence of pneumothorax.  At this point, the drapes were taken down.  The bandage of the right internal jugular vein tunneled dialysis catheter was removed and then the exit site of the tunneled dialysis catheter was prepped with betadine.  I injected another  10 cc of 1% lidocaine without epinephrine around the cuff of this catheter.  Using blunt dissection, I freed the cuff of the catheter and removed the catheter while holding pressure to the right internal jugular vein.  After holding pressure for 5 minutes, no further bleeding occurred.  A sterile dressing was applied to the exit site.  The right arm was wrapped gently with an ACE wrap from hand to shoulder to get some compression of the veins to aid with drainage from the right arm.   COMPLICATIONS: none  CONDITION: stable   Adele Barthel, MD, [facility] [place] Office: [phone] Pager: [phone]  10/01/2016, 11:02 AM

## 2016-10-01 NOTE — Anesthesia Preprocedure Evaluation (Signed)
Anesthesia Evaluation  Patient identified by MRN, date of birth, ID band Patient awake    Reviewed: Allergy & Precautions, NPO status , Patient's Chart, lab work & pertinent test results  Airway Mallampati: II  TM Distance: >3 FB Neck ROM: Full    Dental  (+) Dental Advisory Given, Edentulous Upper, Missing   Pulmonary neg pulmonary ROS,    Pulmonary exam normal breath sounds clear to auscultation       Cardiovascular hypertension, + Past MI  Normal cardiovascular exam Rhythm:Regular Rate:Normal     Neuro/Psych CVA, Residual Symptoms negative psych ROS   GI/Hepatic Neg liver ROS, GERD  ,  Endo/Other  diabetes, Type 2, Insulin Dependent  Renal/GU ESRF and DialysisRenal disease (MWF)     Musculoskeletal  (+) Arthritis , Osteoarthritis,    Abdominal   Peds  Hematology negative hematology ROS (+)   Anesthesia Other Findings Day of surgery medications reviewed with the patient.  Reproductive/Obstetrics                             Anesthesia Physical Anesthesia Plan  ASA: III  Anesthesia Plan: MAC   Post-op Pain Management:    Induction: Intravenous  PONV Risk Score and Plan: 1 and Ondansetron and Treatment may vary due to age or medical condition  Airway Management Planned: Nasal Cannula  Additional Equipment:   Intra-op Plan:   Post-operative Plan:   Informed Consent: I have reviewed the patients History and Physical, chart, labs and discussed the procedure including the risks, benefits and alternatives for the proposed anesthesia with the patient or authorized representative who has indicated his/her understanding and acceptance.   Dental advisory given  Plan Discussed with: CRNA and Anesthesiologist  Anesthesia Plan Comments: (Discussed risks/benefits/alternatives to MAC sedation including need for ventilatory support, hypotension, need for conversion to general anesthesia.   All patient questions answered.  Patient/guardian wishes to proceed.)        Anesthesia Quick Evaluation

## 2016-10-01 NOTE — Anesthesia Procedure Notes (Signed)
Procedure Name: MAC Date/Time: 10/01/2016 10:13 AM Performed by: Barrington Ellison Pre-anesthesia Checklist: Patient identified, Emergency Drugs available, Suction available and Patient being monitored Patient Re-evaluated:Patient Re-evaluated prior to induction Oxygen Delivery Method: Simple face mask

## 2016-10-01 NOTE — H&P (View-Only) (Signed)
    Postoperative Access Visit   History of Present Illness  Charles Vasquez is a 81 y.o. 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.  The patient's wounds are healed.  The patient is able to complete their activities of daily living.  The patient's current symptoms are: R arm swelling. [name] last evaluated pt on 09-06-16. 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  Will get BLE ABI and aortoiliac duplex to evaluate his inflow to his feet.  The patient has expressed concerns with placement of any thigh AVG but is willing to reconsider at this time.  Will have the patient follow up in 4 weeks with the above studies.  Pt has an appointment on 10-07-16 for these studies, and on 10-18-16 with [name].   Pt returns today with c/o swelling in right upper extremity that is worsening, right 2nd finger is getting darker, decreased sensation in right upper extremity.  He dialyzes vis right upper chest catheter on M-W-F.    For VQI Use Only  PRE-ADM LIVING: Home  AMB STATUS: Ambulatory  Physical Examination Vitals:   09/26/16 1005  BP: (!) 157/83  Pulse: 72  Resp: 18  Temp: 97.8 F (36.6 C)  TempSrc: Oral  SpO2: 98%  Weight: 143 lb (64.9 kg)  Height: 5\' 7"  (1.702 m)   Body mass index is 22.4 kg/m.   + Doppler signal right radial artery. Right second finger is dark and mottled. Right upper chest HD catheter in place.  Right arm and hand with 2+ non pitting edema.  Skin feels warm, hand grip is 3/5, sensation in right digits is diminished, no palpable thrill, bruit can not be auscultated    Medical Decision Making  Charles Vasquez is a 81 y.o. 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 He has worsening right arm and hand swelling with numbness and mottling/darkening of right 2nd finger; + Doppler signal right radial pulse.  Dr. Darrick Penna spoke with pt and male friend with him and examined pt. Will schedule for removal of right upper chest catheter and insertion of right femoral HD catheter for 10-01-16 by [name]. By removing the right upper chest catheter, hopefully the right arm swelling will improve.    Thank you for allowing [place] to participate in this patient's care.  Charles Vasquez, Carma Lair, RN, MSN, FNP-C [facility] [place] Office: [phone]  09/26/2016, 6:14 PM  Clinic MD: Darrick Penna

## 2016-10-02 ENCOUNTER — Encounter (HOSPITAL_COMMUNITY): Payer: Self-pay | Admitting: Vascular Surgery

## 2016-10-07 ENCOUNTER — Ambulatory Visit (INDEPENDENT_AMBULATORY_CARE_PROVIDER_SITE_OTHER)
Admission: RE | Admit: 2016-10-07 | Discharge: 2016-10-07 | Disposition: A | Discharge status: Home / Self Care | Payer: Medicare Other | Source: Ambulatory Visit | Attending: Surgery | Admitting: Surgery

## 2016-10-07 ENCOUNTER — Ambulatory Visit (HOSPITAL_COMMUNITY)
Admission: RE | Admit: 2016-10-07 | Discharge: 2016-10-07 | Disposition: A | Discharge status: Home / Self Care | Payer: Medicare Other | Source: Ambulatory Visit | Attending: Surgery | Admitting: Surgery

## 2016-10-07 DIAGNOSIS — I87303 Chronic venous hypertension (idiopathic) without complications of bilateral lower extremity: Secondary | ICD-10-CM

## 2016-10-07 DIAGNOSIS — N186 End stage renal disease: Secondary | ICD-10-CM

## 2016-10-07 DIAGNOSIS — I700 Atherosclerosis of aorta: Secondary | ICD-10-CM | POA: Insufficient documentation

## 2016-10-07 DIAGNOSIS — R0989 Other specified symptoms and signs involving the circulatory and respiratory systems: Secondary | ICD-10-CM | POA: Insufficient documentation

## 2016-10-07 DIAGNOSIS — E1122 Type 2 diabetes mellitus with diabetic chronic kidney disease: Secondary | ICD-10-CM | POA: Diagnosis not present

## 2016-10-07 DIAGNOSIS — I708 Atherosclerosis of other arteries: Secondary | ICD-10-CM | POA: Insufficient documentation

## 2016-10-14 NOTE — Progress Notes (Signed)
    Postoperative Access Visit   History of Present Illness   Charles Vasquez is a 80 y.o. year old male who presents for postoperative follow-up for: left upper arm arteriovenous graft (Date: 07/23/16) complicated by SVC syndrome.   The patient /then had a RIJV TDC placement, leading to R arm swelling.  The patient went to OR on 10/01/16 for RIJV TDC removal and R FV TDC placement.   The patient's wounds are healed.  The patient notes no steal symptoms.  The patient is able to complete their activities of daily living.  The patient's current symptoms are: none.   Physical Examination   Vitals:   10/18/16 0951 10/18/16 0954  BP: (!) 164/77 (!) 160/78  Pulse: 79   Resp: 18   Temp: 98 F (36.7 C)   TempSrc: Oral   SpO2: 96%   Weight: 144 lb (65.3 kg)   Height: 5' 7" (1.702 m)    Pulmonary Sym exp, good air movt, CTAB, no rales, rhonchi, & wheezing  Cardiac RRR, Nl S1, S2, no Murmurs, rubs or gallops  Neck:   mild swelling, prominent veins bilaterally BUE:   RUE swelling 1+, LUE swelling resolved   Non-invasive Vascular Imaging     ABI (10/07/16)  R:   ABI: [place],   PT: bi  DP: bi  TBI:  [place]  L:   ABI: [place],   PT: tri  DP: tri  TBI: [place]  Aortoiliac Duplex (10/07/16)  Ao: 64-86 c/s  R iliac: 62-109 c/s, biphasic  L iliac: 72-113 c/s, biphasic   Medical Decision Making   Charles Vasquez is a 80 y.o. year old male who presents s/p left upper arm arteriovenous graft complicated by SVC syndrome, RIJV TDC complicated by R arm.   Patient has no further arm access options as his central veins are compromised.  He has a R fem TDC in place currently, so I would place a L thigh AVG as his next access option.  This is scheduled for this coming Tuesday 14 AUG 18. Risk, benefits, and alternatives to lower extremity access surgery were discussed.   The patient is aware the risks include but are not limited to: bleeding, infection, steal syndrome, nerve damage, thrombosis,  complications related to venous hypertension, need for additional procedures, gangrene death and stroke.   The patient agrees to proceed forward with the procedure.  Thank you for allowing us to participate in this patient's care.   Loyalty Arentz, MD, FACS [facility] [place] Office: [phone] Pager: [phone]  

## 2016-10-18 ENCOUNTER — Ambulatory Visit (INDEPENDENT_AMBULATORY_CARE_PROVIDER_SITE_OTHER): Payer: Self-pay | Admitting: Vascular Surgery

## 2016-10-18 ENCOUNTER — Encounter: Payer: Self-pay | Admitting: Vascular Surgery

## 2016-10-18 ENCOUNTER — Other Ambulatory Visit: Payer: Self-pay

## 2016-10-18 VITALS — BP 160/78 | HR 79 | Temp 98.0°F | Resp 18 | Ht 67.0 in | Wt 144.0 lb

## 2016-10-18 DIAGNOSIS — N186 End stage renal disease: Secondary | ICD-10-CM

## 2016-10-21 ENCOUNTER — Encounter (HOSPITAL_COMMUNITY): Payer: Self-pay | Admitting: *Deleted

## 2016-10-21 NOTE — Progress Notes (Signed)
Pt denies SOB, chest pain, and being under the care of a cardiologist. Pt denies having a stress test, echo and cardiac cath. Pt made aware to stop taking vitamins, fish oil and herbal medications. Do not take any NSAIDs ie: Ibuprofen, Advil, Naproxen (Aleve). Motrin, BC and Goody Powder . Pt made aware to take only 2 units of Lantus insulin tonight. Pt made aware to check BG every 2 hours prior to arrival to hospital on DOS. Pt made aware to treat a BG < 70 with  4 ounces of apple or cranberry juice, wait 15 minutes after intervention to recheck BG, if BG remains < 70, call Short Stay unit to speak with a nurse. Pt verbalized understanding of all pr-op instructions.

## 2016-10-22 ENCOUNTER — Telehealth: Payer: Self-pay | Admitting: Vascular Surgery

## 2016-10-22 ENCOUNTER — Ambulatory Visit (HOSPITAL_COMMUNITY): Payer: Medicare Other | Admitting: Anesthesiology

## 2016-10-22 ENCOUNTER — Encounter (HOSPITAL_COMMUNITY)
Admission: RE | Disposition: A | Discharge status: Skilled Nursing Facility | Payer: Self-pay | Source: Ambulatory Visit | Attending: Vascular Surgery

## 2016-10-22 ENCOUNTER — Observation Stay (HOSPITAL_COMMUNITY)
Admission: RE | Admit: 2016-10-22 | Discharge: 2016-10-23 | Disposition: A | Discharge status: Skilled Nursing Facility | Payer: Medicare Other | Source: Ambulatory Visit | Attending: Vascular Surgery | Admitting: Vascular Surgery

## 2016-10-22 ENCOUNTER — Encounter (HOSPITAL_COMMUNITY): Payer: Self-pay | Admitting: *Deleted

## 2016-10-22 DIAGNOSIS — D631 Anemia in chronic kidney disease: Secondary | ICD-10-CM | POA: Diagnosis not present

## 2016-10-22 DIAGNOSIS — E875 Hyperkalemia: Secondary | ICD-10-CM | POA: Insufficient documentation

## 2016-10-22 DIAGNOSIS — K219 Gastro-esophageal reflux disease without esophagitis: Secondary | ICD-10-CM | POA: Diagnosis not present

## 2016-10-22 DIAGNOSIS — Z79899 Other long term (current) drug therapy: Secondary | ICD-10-CM | POA: Insufficient documentation

## 2016-10-22 DIAGNOSIS — N186 End stage renal disease: Secondary | ICD-10-CM | POA: Insufficient documentation

## 2016-10-22 DIAGNOSIS — Z992 Dependence on renal dialysis: Secondary | ICD-10-CM | POA: Insufficient documentation

## 2016-10-22 DIAGNOSIS — I120 Hypertensive chronic kidney disease with stage 5 chronic kidney disease or end stage renal disease: Secondary | ICD-10-CM | POA: Insufficient documentation

## 2016-10-22 DIAGNOSIS — R601 Generalized edema: Secondary | ICD-10-CM | POA: Insufficient documentation

## 2016-10-22 DIAGNOSIS — Z888 Allergy status to other drugs, medicaments and biological substances status: Secondary | ICD-10-CM | POA: Insufficient documentation

## 2016-10-22 DIAGNOSIS — E1122 Type 2 diabetes mellitus with diabetic chronic kidney disease: Principal | ICD-10-CM | POA: Insufficient documentation

## 2016-10-22 DIAGNOSIS — Z794 Long term (current) use of insulin: Secondary | ICD-10-CM | POA: Insufficient documentation

## 2016-10-22 DIAGNOSIS — I871 Compression of vein: Secondary | ICD-10-CM | POA: Insufficient documentation

## 2016-10-22 DIAGNOSIS — M1990 Unspecified osteoarthritis, unspecified site: Secondary | ICD-10-CM | POA: Diagnosis not present

## 2016-10-22 DIAGNOSIS — I252 Old myocardial infarction: Secondary | ICD-10-CM | POA: Insufficient documentation

## 2016-10-22 DIAGNOSIS — H0520 Unspecified exophthalmos: Secondary | ICD-10-CM | POA: Insufficient documentation

## 2016-10-22 DIAGNOSIS — E8889 Other specified metabolic disorders: Secondary | ICD-10-CM | POA: Insufficient documentation

## 2016-10-22 DIAGNOSIS — Z7982 Long term (current) use of aspirin: Secondary | ICD-10-CM | POA: Diagnosis not present

## 2016-10-22 DIAGNOSIS — Z8673 Personal history of transient ischemic attack (TIA), and cerebral infarction without residual deficits: Secondary | ICD-10-CM | POA: Insufficient documentation

## 2016-10-22 HISTORY — PX: ARTERIOVENOUS GRAFT PLACEMENT: SUR1029

## 2016-10-22 HISTORY — DX: Dependence on renal dialysis: Z99.2

## 2016-10-22 HISTORY — DX: End stage renal disease: N18.6

## 2016-10-22 HISTORY — PX: AV FISTULA PLACEMENT: SHX1204

## 2016-10-22 LAB — GLUCOSE, CAPILLARY
GLUCOSE-CAPILLARY: 279 mg/dL — AB (ref 65–99)
Glucose-Capillary: 119 mg/dL — ABNORMAL HIGH (ref 65–99)
Glucose-Capillary: 113 mg/dL — ABNORMAL HIGH (ref 65–99)
Glucose-Capillary: 180 mg/dL — ABNORMAL HIGH (ref 65–99)

## 2016-10-22 LAB — MRSA PCR SCREENING: MRSA by PCR: NEGATIVE

## 2016-10-22 LAB — CBC
HCT: 38.6 % — ABNORMAL LOW (ref 39.0–52.0)
Hemoglobin: 13.2 g/dL (ref 13.0–17.0)
MCH: 31.8 pg (ref 26.0–34.0)
MCHC: 34.2 g/dL (ref 30.0–36.0)
MCV: 93 fL (ref 78.0–100.0)
PLATELETS: 69 10*3/uL — AB (ref 150–400)
RBC: 4.15 MIL/uL — AB (ref 4.22–5.81)
RDW: 19.6 % — ABNORMAL HIGH (ref 11.5–15.5)
WBC: 5.5 10*3/uL (ref 4.0–10.5)

## 2016-10-22 LAB — POCT I-STAT 4, (NA,K, GLUC, HGB,HCT)
GLUCOSE: 135 mg/dL — AB (ref 65–99)
HEMATOCRIT: 34 % — AB (ref 39.0–52.0)
Hemoglobin: 11.6 g/dL — ABNORMAL LOW (ref 13.0–17.0)
Potassium: 4.2 mmol/L (ref 3.5–5.1)
SODIUM: 137 mmol/L (ref 135–145)

## 2016-10-22 LAB — CREATININE, SERUM
Creatinine, Ser: 7.57 mg/dL — ABNORMAL HIGH (ref 0.61–1.24)
GFR calc Af Amer: 7 mL/min — ABNORMAL LOW (ref >60)
GFR calc non Af Amer: 6 mL/min — ABNORMAL LOW (ref >60)

## 2016-10-22 LAB — TYPE AND SCREEN
ABO/RH(D): O POS
Antibody Screen: NEGATIVE

## 2016-10-22 LAB — ABO/RH: ABO/RH(D): O POS

## 2016-10-22 SURGERY — INSERTION OF ARTERIOVENOUS (AV) GORE-TEX GRAFT THIGH
Anesthesia: General | Site: Leg Upper | Laterality: Left

## 2016-10-22 MED ORDER — SODIUM CHLORIDE 0.9 % IV SOLN
INTRAVENOUS | Status: DC | PRN
  Administered 2016-10-22: 12:00:00

## 2016-10-22 MED ORDER — SODIUM CHLORIDE 0.9 % IV SOLN
INTRAVENOUS | Status: DC
  Administered 2016-10-22: 11:00:00 via INTRAVENOUS

## 2016-10-22 MED ORDER — ASPIRIN EC 325 MG PO TBEC: 325.0000 mg | DELAYED_RELEASE_TABLET | Freq: Three times a day (TID) | ORAL | Status: DC | PRN

## 2016-10-22 MED ORDER — LABETALOL HCL 5 MG/ML IV SOLN: 10.0000 mg | INTRAVENOUS | Status: DC | PRN

## 2016-10-22 MED ORDER — SODIUM CHLORIDE 0.9% FLUSH
3.0000 mL | Freq: Two times a day (BID) | INTRAVENOUS | Status: DC
  Administered 2016-10-22: 3 mL via INTRAVENOUS

## 2016-10-22 MED ORDER — GUAIFENESIN-DM 100-10 MG/5ML PO SYRP: 15.0000 mL | ORAL_SOLUTION | ORAL | Status: DC | PRN

## 2016-10-22 MED ORDER — ONDANSETRON HCL 4 MG/2ML IJ SOLN: 4.0000 mg | Freq: Four times a day (QID) | INTRAMUSCULAR | Status: DC | PRN

## 2016-10-22 MED ORDER — INSULIN GLARGINE 100 UNIT/ML ~~LOC~~ SOLN
5.0000 [IU] | Freq: Every day | SUBCUTANEOUS | Status: DC
  Administered 2016-10-22 – 2016-10-23 (×2): 5 [IU] via SUBCUTANEOUS
  Filled 2016-10-22 (×2): qty 0.05

## 2016-10-22 MED ORDER — LIDOCAINE HCL (PF) 1 % IJ SOLN
INTRAMUSCULAR | Status: AC
  Filled 2016-10-22: qty 30

## 2016-10-22 MED ORDER — MORPHINE SULFATE (PF) 2 MG/ML IV SOLN: 2.0000 mg | INTRAVENOUS | Status: DC | PRN

## 2016-10-22 MED ORDER — PANTOPRAZOLE SODIUM 40 MG PO TBEC
40.0000 mg | DELAYED_RELEASE_TABLET | Freq: Every day | ORAL | Status: DC
  Administered 2016-10-22 – 2016-10-23 (×2): 40 mg via ORAL
  Filled 2016-10-22 (×2): qty 1

## 2016-10-22 MED ORDER — PROPOFOL 10 MG/ML IV BOLUS
INTRAVENOUS | Status: DC | PRN
  Administered 2016-10-22: 120 mg via INTRAVENOUS

## 2016-10-22 MED ORDER — ONDANSETRON HCL 4 MG/2ML IJ SOLN: 4.0000 mg | Freq: Once | INTRAMUSCULAR | Status: DC | PRN

## 2016-10-22 MED ORDER — ONDANSETRON HCL 4 MG/2ML IJ SOLN
INTRAMUSCULAR | Status: DC | PRN
  Administered 2016-10-22: 4 mg via INTRAVENOUS

## 2016-10-22 MED ORDER — ONDANSETRON HCL 4 MG/2ML IJ SOLN
INTRAMUSCULAR | Status: AC
  Filled 2016-10-22: qty 2

## 2016-10-22 MED ORDER — FENTANYL CITRATE (PF) 100 MCG/2ML IJ SOLN
INTRAMUSCULAR | Status: DC | PRN
  Administered 2016-10-22 (×2): 50 ug via INTRAVENOUS

## 2016-10-22 MED ORDER — PHENOL 1.4 % MT LIQD: 1.0000 | OROMUCOSAL | Status: DC | PRN

## 2016-10-22 MED ORDER — ACETAMINOPHEN 325 MG PO TABS
325.0000 mg | ORAL_TABLET | ORAL | Status: DC | PRN
  Administered 2016-10-23: 650 mg via ORAL
  Filled 2016-10-22: qty 2

## 2016-10-22 MED ORDER — HEMOSTATIC AGENTS (NO CHARGE) OPTIME
TOPICAL | Status: DC | PRN
  Administered 2016-10-22: 1 via TOPICAL

## 2016-10-22 MED ORDER — SUCCINYLCHOLINE CHLORIDE 200 MG/10ML IV SOSY
PREFILLED_SYRINGE | INTRAVENOUS | Status: DC | PRN
  Administered 2016-10-22: 100 mg via INTRAVENOUS

## 2016-10-22 MED ORDER — PROTAMINE SULFATE 10 MG/ML IV SOLN
INTRAVENOUS | Status: DC | PRN
  Administered 2016-10-22: 30 mg via INTRAVENOUS

## 2016-10-22 MED ORDER — EPHEDRINE 5 MG/ML INJ
INTRAVENOUS | Status: AC
  Filled 2016-10-22: qty 10

## 2016-10-22 MED ORDER — ALUM & MAG HYDROXIDE-SIMETH 200-200-20 MG/5ML PO SUSP: 15.0000 mL | ORAL | Status: DC | PRN

## 2016-10-22 MED ORDER — SODIUM CHLORIDE 0.9% FLUSH: 3.0000 mL | INTRAVENOUS | Status: DC | PRN

## 2016-10-22 MED ORDER — FENTANYL CITRATE (PF) 250 MCG/5ML IJ SOLN
INTRAMUSCULAR | Status: AC
  Filled 2016-10-22: qty 5

## 2016-10-22 MED ORDER — ACETAMINOPHEN 650 MG RE SUPP: 325.0000 mg | RECTAL | Status: DC | PRN

## 2016-10-22 MED ORDER — EPHEDRINE SULFATE-NACL 50-0.9 MG/10ML-% IV SOSY
PREFILLED_SYRINGE | INTRAVENOUS | Status: DC | PRN
  Administered 2016-10-22: 5 mg via INTRAVENOUS

## 2016-10-22 MED ORDER — CALCIUM CARBONATE ANTACID 500 MG PO CHEW: 1.5000 | CHEWABLE_TABLET | Freq: Two times a day (BID) | ORAL | Status: DC | PRN

## 2016-10-22 MED ORDER — OXYCODONE-ACETAMINOPHEN 5-325 MG PO TABS
1.0000 | ORAL_TABLET | Freq: Four times a day (QID) | ORAL | 0 refills | Status: DC | PRN
Start: 2016-10-22 — End: 2016-11-22

## 2016-10-22 MED ORDER — HYDRALAZINE HCL 20 MG/ML IJ SOLN: 5.0000 mg | INTRAMUSCULAR | Status: DC | PRN

## 2016-10-22 MED ORDER — OXYCODONE-ACETAMINOPHEN 5-325 MG PO TABS: 1.0000 | ORAL_TABLET | ORAL | Status: DC | PRN

## 2016-10-22 MED ORDER — SODIUM CHLORIDE 0.9 % IV SOLN: 250.0000 mL | INTRAVENOUS | Status: DC | PRN

## 2016-10-22 MED ORDER — CHLORHEXIDINE GLUCONATE CLOTH 2 % EX PADS: 6.0000 | MEDICATED_PAD | Freq: Once | CUTANEOUS | Status: DC

## 2016-10-22 MED ORDER — INSULIN ASPART 100 UNIT/ML ~~LOC~~ SOLN
0.0000 [IU] | Freq: Three times a day (TID) | SUBCUTANEOUS | Status: DC
  Administered 2016-10-23: 5 [IU] via SUBCUTANEOUS

## 2016-10-22 MED ORDER — FENTANYL CITRATE (PF) 100 MCG/2ML IJ SOLN: 25.0000 ug | INTRAMUSCULAR | Status: DC | PRN

## 2016-10-22 MED ORDER — LOSARTAN POTASSIUM 50 MG PO TABS
100.0000 mg | ORAL_TABLET | Freq: Every day | ORAL | Status: DC
  Administered 2016-10-23: 100 mg via ORAL
  Filled 2016-10-22: qty 2

## 2016-10-22 MED ORDER — CLINDAMYCIN HCL 300 MG PO CAPS
300.0000 mg | ORAL_CAPSULE | Freq: Three times a day (TID) | ORAL | Status: DC
  Administered 2016-10-22 – 2016-10-23 (×4): 300 mg via ORAL
  Filled 2016-10-22 (×4): qty 1

## 2016-10-22 MED ORDER — HEPARIN SODIUM (PORCINE) 1000 UNIT/ML IJ SOLN
INTRAMUSCULAR | Status: DC | PRN
  Administered 2016-10-22: 5000 [IU] via INTRAVENOUS

## 2016-10-22 MED ORDER — LIDOCAINE 2% (20 MG/ML) 5 ML SYRINGE
INTRAMUSCULAR | Status: DC | PRN
  Administered 2016-10-22: 60 mg via INTRAVENOUS

## 2016-10-22 MED ORDER — PROTAMINE SULFATE 10 MG/ML IV SOLN
INTRAVENOUS | Status: AC
  Filled 2016-10-22: qty 5

## 2016-10-22 MED ORDER — LATANOPROST 0.005 % OP SOLN
1.0000 [drp] | Freq: Every day | OPHTHALMIC | Status: DC
  Administered 2016-10-22 – 2016-10-23 (×2): 1 [drp] via OPHTHALMIC
  Filled 2016-10-22: qty 2.5

## 2016-10-22 MED ORDER — HEPARIN SODIUM (PORCINE) 5000 UNIT/ML IJ SOLN
5000.0000 [IU] | Freq: Three times a day (TID) | INTRAMUSCULAR | Status: DC
  Administered 2016-10-22 – 2016-10-23 (×3): 5000 [IU] via SUBCUTANEOUS
  Filled 2016-10-22 (×2): qty 1

## 2016-10-22 MED ORDER — METOPROLOL TARTRATE 5 MG/5ML IV SOLN: 2.0000 mg | INTRAVENOUS | Status: DC | PRN

## 2016-10-22 MED ORDER — RENA-VITE PO TABS
1.0000 | ORAL_TABLET | Freq: Every day | ORAL | Status: DC
  Administered 2016-10-22 – 2016-10-23 (×2): 1 via ORAL
  Filled 2016-10-22 (×2): qty 1

## 2016-10-22 MED ORDER — PHENYLEPHRINE HCL 10 MG/ML IJ SOLN
INTRAVENOUS | Status: DC | PRN
  Administered 2016-10-22: 25 ug/min via INTRAVENOUS

## 2016-10-22 MED ORDER — VALACYCLOVIR HCL 500 MG PO TABS
1000.0000 mg | ORAL_TABLET | Freq: Every day | ORAL | Status: DC
  Administered 2016-10-22 – 2016-10-23 (×2): 1000 mg via ORAL
  Filled 2016-10-22 (×2): qty 2

## 2016-10-22 MED ORDER — DEXTROSE 5 % IV SOLN
1.5000 g | INTRAVENOUS | Status: AC
  Administered 2016-10-22: 1.5 g via INTRAVENOUS
  Filled 2016-10-22: qty 1.5

## 2016-10-22 MED ORDER — 0.9 % SODIUM CHLORIDE (POUR BTL) OPTIME
TOPICAL | Status: DC | PRN
  Administered 2016-10-22: 1000 mL

## 2016-10-22 MED ORDER — POTASSIUM CHLORIDE CRYS ER 20 MEQ PO TBCR: 20.0000 meq | EXTENDED_RELEASE_TABLET | Freq: Once | ORAL | Status: DC

## 2016-10-22 SURGICAL SUPPLY — 35 items
CANISTER SUCT 3000ML PPV (MISCELLANEOUS) ×3 IMPLANT
CLIP VESOCCLUDE MED 6/CT (CLIP) ×3 IMPLANT
CLIP VESOCCLUDE SM WIDE 6/CT (CLIP) ×3 IMPLANT
DERMABOND ADVANCED (GAUZE/BANDAGES/DRESSINGS) ×4
DERMABOND ADVANCED .7 DNX12 (GAUZE/BANDAGES/DRESSINGS) ×2 IMPLANT
DRAPE INCISE IOBAN 66X45 STRL (DRAPES) ×3 IMPLANT
ELECT REM PT RETURN 9FT ADLT (ELECTROSURGICAL) ×3
ELECTRODE REM PT RTRN 9FT ADLT (ELECTROSURGICAL) ×1 IMPLANT
GLOVE BIO SURGEON STRL SZ 6.5 (GLOVE) ×4 IMPLANT
GLOVE BIO SURGEON STRL SZ7 (GLOVE) ×3 IMPLANT
GLOVE BIO SURGEONS STRL SZ 6.5 (GLOVE) ×2
GLOVE BIOGEL PI IND STRL 6.5 (GLOVE) ×3 IMPLANT
GLOVE BIOGEL PI IND STRL 7.0 (GLOVE) ×2 IMPLANT
GLOVE BIOGEL PI IND STRL 7.5 (GLOVE) ×1 IMPLANT
GLOVE BIOGEL PI INDICATOR 6.5 (GLOVE) ×6
GLOVE BIOGEL PI INDICATOR 7.0 (GLOVE) ×4
GLOVE BIOGEL PI INDICATOR 7.5 (GLOVE) ×2
GLOVE SURG SS PI 6.0 STRL IVOR (GLOVE) ×3 IMPLANT
GOWN STRL REUS W/ TWL LRG LVL3 (GOWN DISPOSABLE) ×4 IMPLANT
GOWN STRL REUS W/TWL LRG LVL3 (GOWN DISPOSABLE) ×8
GRAFT GORETEX STRT 4-7X45 (Vascular Products) ×3 IMPLANT
HEMOSTAT SPONGE AVITENE ULTRA (HEMOSTASIS) ×3 IMPLANT
KIT BASIN OR (CUSTOM PROCEDURE TRAY) ×3 IMPLANT
KIT ROOM TURNOVER OR (KITS) ×3 IMPLANT
NS IRRIG 1000ML POUR BTL (IV SOLUTION) ×3 IMPLANT
PACK CV ACCESS (CUSTOM PROCEDURE TRAY) ×3 IMPLANT
PAD ARMBOARD 7.5X6 YLW CONV (MISCELLANEOUS) ×6 IMPLANT
SUT GORETEX 5 0 TT13 24 (SUTURE) ×6 IMPLANT
SUT MNCRL AB 4-0 PS2 18 (SUTURE) ×3 IMPLANT
SUT PROLENE 6 0 BV (SUTURE) ×6 IMPLANT
SUT VIC AB 2-0 CT1 27 (SUTURE) ×2
SUT VIC AB 2-0 CT1 TAPERPNT 27 (SUTURE) ×1 IMPLANT
SUT VIC AB 3-0 SH 27 (SUTURE) ×4
SUT VIC AB 3-0 SH 27X BRD (SUTURE) ×2 IMPLANT
WATER STERILE IRR 1000ML POUR (IV SOLUTION) ×3 IMPLANT

## 2016-10-22 NOTE — Progress Notes (Signed)
Patient's friend [Dolores] claims that patient doesn't have anybody to stay with him overnight. She called primary physician and said that he needs order for transfer to rehab in [place]. Pamelia Hoit RN said that patient wants to go home when asked earlier by [employer] . Pamelia Hoit RN talked to [employer] will write order for overnight stay and to go home tomorrow. Kim PA will comeby to talked to patient and friend.

## 2016-10-22 NOTE — Interval H&P Note (Signed)
   History and Physical Update  The patient was interviewed and re-examined.  The patient's previous History and Physical has been reviewed and is unchanged from my consult except for right hand swelling with reported infection.  The patient has been on antibiotics.  Pt has normal WBC and no fever.  There is no change in the plan of care: Left thigh arteriovenous graft.     The patient is aware the risks include but are not limited to: bleeding, infection, steal syndrome, nerve damage, thrombosis, complications related to venous hypertension, need for additional procedures, gangrene, death and stroke.   The patient agrees to proceed forward with the procedure.    [name], MD, FACS [facility] [place] Office: [phone] Pager: [phone]  10/22/2016, 10:32 AM

## 2016-10-22 NOTE — Anesthesia Preprocedure Evaluation (Addendum)
Anesthesia Evaluation  Patient identified by MRN, date of birth, ID band Patient awake    Reviewed: Allergy & Precautions, NPO status , Patient's Chart, lab work & pertinent test results  Airway Mallampati: II  TM Distance: >3 FB Neck ROM: Full    Dental  (+) Dental Advisory Given, Edentulous Upper, Missing, Poor Dentition,    Pulmonary neg pulmonary ROS,    Pulmonary exam normal breath sounds clear to auscultation       Cardiovascular hypertension, + Past MI  Normal cardiovascular exam Rhythm:Regular Rate:Normal     Neuro/Psych CVA, Residual Symptoms negative psych ROS   GI/Hepatic Neg liver ROS, GERD  Controlled,  Endo/Other  diabetes, Type 2, Insulin Dependent  Renal/GU ESRF and DialysisRenal disease (MWF)     Musculoskeletal  (+) Arthritis , Osteoarthritis,    Abdominal   Peds  Hematology negative hematology ROS (+)   Anesthesia Other Findings Day of surgery medications reviewed with the patient.  Reproductive/Obstetrics                            Anesthesia Physical  Anesthesia Plan  ASA: III  Anesthesia Plan: General   Post-op Pain Management:    Induction: Intravenous  PONV Risk Score and Plan: 2 and Ondansetron, Propofol infusion and Treatment may vary due to age or medical condition  Airway Management Planned: Oral ETT  Additional Equipment:   Intra-op Plan:   Post-operative Plan:   Informed Consent: I have reviewed the patients History and Physical, chart, labs and discussed the procedure including the risks, benefits and alternatives for the proposed anesthesia with the patient or authorized representative who has indicated his/her understanding and acceptance.   Dental advisory given  Plan Discussed with: CRNA  Anesthesia Plan Comments:       Anesthesia Quick Evaluation

## 2016-10-22 NOTE — Progress Notes (Addendum)
New Admission Note:   Arrival Method: stretcher from PACU  Mental Orientation: alert and oriented x3 (not orientated to time )  Telemetry: box 4  Assessment: Completed Skin: see flow sheet IV: L Hand  Pain: denies  Tubes: HD cath on right  Safety Measures: Safety Fall Prevention Plan has been given, discussed and signed Admission: Completed [facility] [place] Orientation: Patient has been orientated to the room, unit and staff.  Family: Mother at bedside   Orders have been reviewed and implemented. Will continue to monitor the patient. Call light has been placed within reach and bed alarm has been activated.   Nelma Rothman, RN [facility] Phone number: [phone]

## 2016-10-22 NOTE — H&P (View-Only) (Signed)
    Postoperative Access Visit   History of Present Illness   ZAYDE LAESSIG is a 81 y.o. year old male who presents for postoperative follow-up for: left upper arm arteriovenous graft (Date: 07/23/16) complicated by SVC syndrome.   The patient /then had a RIJV TDC placement, leading to R arm swelling.  The patient went to OR on 10/01/16 for RIJV TDC removal and R FV TDC placement.   The patient's wounds are healed.  The patient notes no steal symptoms.  The patient is able to complete their activities of daily living.  The patient's current symptoms are: none.   Physical Examination   Vitals:   10/18/16 0951 10/18/16 0954  BP: (!) 164/77 (!) 160/78  Pulse: 79   Resp: 18   Temp: 98 F (36.7 C)   TempSrc: Oral   SpO2: 96%   Weight: 144 lb (65.3 kg)   Height: 5\' 7"  (1.702 m)    Pulmonary Sym exp, good air movt, CTAB, no rales, rhonchi, & wheezing  Cardiac RRR, Nl S1, S2, no Murmurs, rubs or gallops  Neck:   mild swelling, prominent veins bilaterally BUE:   RUE swelling 1+, LUE swelling resolved   Non-invasive Vascular Imaging     ABI (10/07/16)  R:   ABI: [place],   PT: bi  DP: bi  TBI:  [place]  L:   ABI: [place],   PT: tri  DP: tri  TBI: [place]  Aortoiliac Duplex (10/07/16)  Ao: 64-86 c/s  R iliac: 62-109 c/s, biphasic  L iliac: 72-113 c/s, biphasic   Medical Decision Making   ELLIE DEMETRO is a 81 y.o. year old male who presents s/p left upper arm arteriovenous graft complicated by SVC syndrome, RIJV TDC complicated by R arm.   Patient has no further arm access options as his central veins are compromised.  He has a R fem TDC in place currently, so I would place a L thigh AVG as his next access option.  This is scheduled for this coming Tuesday 14 AUG 18. Risk, benefits, and alternatives to lower extremity access surgery were discussed.   The patient is aware the risks include but are not limited to: bleeding, infection, steal syndrome, nerve damage, thrombosis,  complications related to venous hypertension, need for additional procedures, gangrene death and stroke.   The patient agrees to proceed forward with the procedure.  Thank you for allowing [place] to participate in this patient's care.   [name], MD, FACS [facility] [place] Office: [phone] Pager: [phone]

## 2016-10-22 NOTE — Transfer of Care (Signed)
Immediate Anesthesia Transfer of Care Note  Patient: Charles Vasquez  Procedure(s) Performed: Procedure(s): INSERTION OF 4-18mm x 45cm  ARTERIOVENOUS (AV) GORE-TEX GRAFT THIGH-LEFT (Left)  Patient Location: PACU  Anesthesia Type:General  Level of Consciousness: awake, alert , oriented and patient cooperative  Airway & Oxygen Therapy: Patient Spontanous Breathing and Patient connected to face mask oxygen  Post-op Assessment: Report given to RN and Post -op Vital signs reviewed and stable  Post vital signs: Reviewed and stable  Last Vitals:  Vitals:   10/22/16 0817  BP: (!) 231/81  Pulse: 71  Resp: 20  Temp: 36.8 C  SpO2: 100%    Last Pain:  Vitals:   10/22/16 0834  TempSrc:   PainSc: 10-Worst pain ever      Patients Stated Pain Goal: 3 (10/22/16 0834)  Complications: No apparent anesthesia complications

## 2016-10-22 NOTE — Anesthesia Postprocedure Evaluation (Signed)
Anesthesia Post Note  Patient: Charles Vasquez  Procedure(s) Performed: Procedure(s) (LRB): INSERTION OF 4-46mm x 45cm  ARTERIOVENOUS (AV) GORE-TEX GRAFT THIGH-LEFT (Left)     Patient location during evaluation: PACU Anesthesia Type: General Level of consciousness: awake and alert Pain management: pain level controlled Vital Signs Assessment: post-procedure vital signs reviewed and stable Respiratory status: spontaneous breathing, nonlabored ventilation, respiratory function stable and patient connected to nasal cannula oxygen Cardiovascular status: blood pressure returned to baseline and stable Postop Assessment: no signs of nausea or vomiting Anesthetic complications: no    Last Vitals:  Vitals:   10/22/16 1445 10/22/16 1500  BP: (!) 124/48 (!) 144/68  Pulse: 72 76  Resp: 19 15  Temp:    SpO2: 96% 98%    Last Pain:  Vitals:   10/22/16 0834  TempSrc:   PainSc: 10-Worst pain ever                 Beryle Lathe

## 2016-10-22 NOTE — Anesthesia Procedure Notes (Signed)
Procedure Name: Intubation Date/Time: 10/22/2016 11:49 AM Performed by: Annabelle Harman A Pre-anesthesia Checklist: Patient identified, Emergency Drugs available, Suction available and Patient being monitored Patient Re-evaluated:Patient Re-evaluated prior to induction Oxygen Delivery Method: Circle system utilized Preoxygenation: Pre-oxygenation with 100% oxygen Induction Type: IV induction Ventilation: Mask ventilation without difficulty and Oral airway inserted - appropriate to patient size Laryngoscope Size: Hyacinth Meeker and 2 Grade View: Grade I Tube type: Oral Tube size: 7.5 mm Number of attempts: 1 Airway Equipment and Method: Stylet Placement Confirmation: ETT inserted through vocal cords under direct vision,  positive ETCO2 and breath sounds checked- equal and bilateral Secured at: 24 cm Tube secured with: Tape Dental Injury: Teeth and Oropharynx as per pre-operative assessment

## 2016-10-22 NOTE — Progress Notes (Addendum)
Patient s/p left thigh graft today. Patient wants to go home, but girlfriend Alfonse Ras states that she is not comfortable with him discharging today. She thinks that he will need to go rehab as he has been unsteady on his feet for a while. I discussed that rehab is not normally something that is required after this kind of surgery. Patient is currently a little groggy from anesthesia and required some assistance getting up from the chair today. Normally does not use cane or walker at home. Will admit for observation and have PT evaluate tomorow. He normally dialyzes at 0530 am on Wednesdays. Called his HD center and had this pushed back to 1100 am.

## 2016-10-22 NOTE — Progress Notes (Signed)
   Daily Progress Note  On exam, right hand looks more edematous than in clinic recently but no obvious signs of infection.  Pt does not demonstrate any signs of bacteremia at this point, so I would proceed.  I discussed with the patient possibly giving some platelets if he demonstrates clinically significant bleeding intra-operatively.  The patient has no objections to such.    - will proceed with left thigh arteriovenous graft placement   [name], MD, FACS [facility] [place] Office: [phone] Pager: [phone]  10/22/2016, 10:39 AM

## 2016-10-22 NOTE — Telephone Encounter (Signed)
LVM for post op on 9/21 mailed letter to home address

## 2016-10-22 NOTE — Telephone Encounter (Signed)
-----   Message from Sharee Pimple, RN sent at 10/22/2016  1:59 PM EDT ----- Regarding: 4 weeks post Thigh graft    ----- Message ----- From: Raymond Gurney, PA-C Sent: 10/22/2016   1:38 PM To: Vvs Charge Pool  S/p left thigh graft 10/22/16  F/u with [name] in 4 weeks. No duplex.  Thanks Selena Batten

## 2016-10-22 NOTE — Op Note (Signed)
OPERATIVE NOTE   PROCEDURE:  left thigh arteriovenous graft  PRE-OPERATIVE DIAGNOSIS: end stage renal disease   POST-OPERATIVE DIAGNOSIS: same as above   SURGEON: [name], MD  ASSISTANT(S): Karsten Ro, PAC   ANESTHESIA: general  ESTIMATED BLOOD LOSS: 100 cc  FINDING(S): 1. Some calcified common femoral artery with lateral soft sopt 2. Palpable thrill at end of case  SPECIMEN(S):  none  INDICATIONS:   Charles Vasquez is a 81 y.o. male who presents with end stage renal disease requiring hemodialysis.  This patient has no further access in his arm due to central venous occlusions.  Risk, benefits, and alternatives to thigh arteriovenous graft placement were discussed.  The patient is aware the risks include but are not limited to: bleeding, infection, steal syndrome, nerve damage, ischemic monomelic neuropathy, failure to mature, possible development of critical limb ischemia due to steal syndrome and need for additional procedures.  The patient is aware of the risks and elects to proceed forward.  DESCRIPTION: After full informed written consent was obtained from the patient, the patient was brought back to the operating room and placed supine upon the operating table.  The patient was given IV antibiotics prior to proceeding.  After obtaining adequate sedation, the patient was prepped and draped in standard fashion for a left thigh arteriovenous graft placement.  I made an oblique incision 1 finger-width above the left groin crease, over the common femoral artery and dissected down through the subcutaneous tissue until I had access to the common femoral artery and common femoral vein.  The common femoral artery appeared to be 4-5 mm externally and the common femoral vein appeared to be >10 mm externally.  I obtained a 4 mm x 7 mm goretex graft and stretched it to full length.  I then determined where the apex of the loop of the graft would be located.  I then placed an apical  incision and dissected a small pocket in the subcutaneous tissue at both locations.   Then using a Gore tunneler I tunneled medially from the groin to the apical and delivered the graft.  I then tunneled from groin exposure laterally to the apical incision and completed delivering the graft, leaving the 4 mm end on the lateral side and the 7 mm end on the medial side.  The orientation of the graft was maintained throughout this process.  I then gave the patient 5000 units of heparin to gain some limited anticoagulation.  After waiting 3 minutes, I then clamped the common femoral artery proximally and distally and made an arteriotomy and extended it with a Potts scissor.  I then spatulate the graft to meet the dimensions of the artery and sewed to the artery in an end-to-side configuration with running stitch of CV-5 suture.  I clamped the graft near its arterial anastomosis and released the clamps on the common femoral artery.  I then pulled the graft to appropriate tension throughout the tunnel.  I then clamped the common femoral vein proximally and distally and made a venotomy, which I extended with a Pott's scissor.  The graft was then spatulated to meet the dimensions of the venotomy.  The graft was sewn to the vein in an end-to-side configuration with a running stitch of CV-5.  Prior to completion this anastomosis, I released all clamps on the graft and vein, allowing the graft and vein to backbleed before completing the anastomosis.  There was excellent graft bleeding.  There was excellent backbleeding from the  vein.  The anastomosis was completed in the usual fashion.    I gave 30 mg of Protamine to reverse anticoagulation and placed Avitene in the wound.  After waiting, a few minutes, I washed out the wound and there was no further active bleeding.  The common femoral artery was examined which demonstrated: palpable pulses proximal and distal to the graft.  The venous outflow was also examined, which  demonstrated: strong thrill.  The groin was repaired with a double layer of 2-0 Vicryl, a layer of 3-0 Vicryl, and a running subcuticular of 4-0 Monocryl.  The skin was then cleaned, dried, and Dermabond used to reinforce the skin closure.     COMPLICATIONS: none  CONDITION: stable  [name], MD, [facility] [place] Office: [phone] Pager: [phone]  10/22/2016, 1:34 PM

## 2016-10-23 ENCOUNTER — Encounter (HOSPITAL_COMMUNITY): Payer: Self-pay | Admitting: Vascular Surgery

## 2016-10-23 DIAGNOSIS — E1122 Type 2 diabetes mellitus with diabetic chronic kidney disease: Secondary | ICD-10-CM | POA: Diagnosis not present

## 2016-10-23 LAB — BASIC METABOLIC PANEL
ANION GAP: 15 (ref 5–15)
BUN: 54 mg/dL — ABNORMAL HIGH (ref 6–20)
CO2: 23 mmol/L (ref 22–32)
Calcium: 8.1 mg/dL — ABNORMAL LOW (ref 8.9–10.3)
Chloride: 100 mmol/L — ABNORMAL LOW (ref 101–111)
Creatinine, Ser: 8.21 mg/dL — ABNORMAL HIGH (ref 0.61–1.24)
GFR calc Af Amer: 6 mL/min — ABNORMAL LOW (ref >60)
GFR, EST NON AFRICAN AMERICAN: 5 mL/min — AB (ref >60)
Glucose, Bld: 198 mg/dL — ABNORMAL HIGH (ref 65–99)
POTASSIUM: 4.9 mmol/L (ref 3.5–5.1)
SODIUM: 138 mmol/L (ref 135–145)

## 2016-10-23 LAB — GLUCOSE, CAPILLARY
GLUCOSE-CAPILLARY: 153 mg/dL — AB (ref 65–99)
Glucose-Capillary: 145 mg/dL — ABNORMAL HIGH (ref 65–99)
Glucose-Capillary: 283 mg/dL — ABNORMAL HIGH (ref 65–99)
Glucose-Capillary: 96 mg/dL (ref 65–99)

## 2016-10-23 LAB — CBC
HEMATOCRIT: 31.6 % — AB (ref 39.0–52.0)
HEMOGLOBIN: 10.5 g/dL — AB (ref 13.0–17.0)
MCH: 31 pg (ref 26.0–34.0)
MCHC: 33.2 g/dL (ref 30.0–36.0)
MCV: 93.2 fL (ref 78.0–100.0)
Platelets: 157 10*3/uL (ref 150–400)
RBC: 3.39 MIL/uL — AB (ref 4.22–5.81)
RDW: 19.9 % — ABNORMAL HIGH (ref 11.5–15.5)
WBC: 7.4 10*3/uL (ref 4.0–10.5)

## 2016-10-23 MED ORDER — SODIUM CHLORIDE 0.9 % IV SOLN: 100.0000 mL | INTRAVENOUS | Status: DC | PRN

## 2016-10-23 MED ORDER — HEPARIN SODIUM (PORCINE) 1000 UNIT/ML DIALYSIS: 1000.0000 [IU] | INTRAMUSCULAR | Status: DC | PRN

## 2016-10-23 MED ORDER — LIDOCAINE-PRILOCAINE 2.5-2.5 % EX CREA: 1.0000 "application " | TOPICAL_CREAM | CUTANEOUS | Status: DC | PRN

## 2016-10-23 MED ORDER — DOXERCALCIFEROL 4 MCG/2ML IV SOLN
INTRAVENOUS | Status: AC
  Filled 2016-10-23: qty 2

## 2016-10-23 MED ORDER — HEPARIN SODIUM (PORCINE) 1000 UNIT/ML DIALYSIS: 3800.0000 [IU] | Freq: Once | INTRAMUSCULAR | Status: DC

## 2016-10-23 MED ORDER — DOXERCALCIFEROL 4 MCG/2ML IV SOLN: 4.0000 ug | INTRAVENOUS | Status: DC

## 2016-10-23 MED ORDER — ALTEPLASE 2 MG IJ SOLR: 2.0000 mg | Freq: Once | INTRAMUSCULAR | Status: DC | PRN

## 2016-10-23 MED ORDER — LIDOCAINE HCL (PF) 1 % IJ SOLN: 5.0000 mL | INTRAMUSCULAR | Status: DC | PRN

## 2016-10-23 MED ORDER — PENTAFLUOROPROP-TETRAFLUOROETH EX AERO: 1.0000 "application " | INHALATION_SPRAY | CUTANEOUS | Status: DC | PRN

## 2016-10-23 NOTE — Discharge Summary (Signed)
Transfer Summary  Charles Vasquez Nov 19, 1935 81 y.o. male  [id number]  Admission Date: 10/22/2016  Discharge Date: 10/23/2016  Physician: [name], MD  Admission Diagnosis: End Stage Renal Disease N18.6  HPI:   This is a 81 y.o. male who presented for postoperative follow-up for: left upper arm arteriovenous graft (Date: 07/23/16) complicated by SVC syndrome.   The patient /then had a RIJV TDC placement, leading to R arm swelling.  The patient went to OR on 10/01/16 for RIJV TDC removal and R FV TDC placement.   The patient's wounds are healed.  The patient notes no steal symptoms.  The patient is able to complete their activities of daily living.  The patient's current symptoms are: none.   Hospital Course:  The patient was admitted to the hospital and taken to the operating room on 10/22/2016 and underwent: left thigh arteriovenous graft placement.    The patient tolerated the procedure well and was transported to the PACU in stable condition.   In the recovery area, the patient wanted to go home, but girlfriend Alfonse Ras stated that she was not comfortable with him discharging home. She thinks that he will need to go rehab as he has been unsteady on his feet for a while. Normally does not use cane or walker at home. Patient was admitted for observation. PT evaluated patient on POD 1 and recommended SNF. Nephrology was consulted for HD. He was discharged to SNF on 10/23/16 after HD.    CBC    Component Value Date/Time   WBC 7.4 10/23/2016 0349   RBC 3.39 (L) 10/23/2016 0349   HGB 10.5 (L) 10/23/2016 0349   HGB 11.8 (L) 11/29/2011 0614   HCT 31.6 (L) 10/23/2016 0349   HCT 34.9 (L) 11/29/2011 0614   PLT 157 10/23/2016 0349   PLT 97 (L) 11/29/2011 0614   MCV 93.2 10/23/2016 0349   MCV 92 11/29/2011 0614   MCH 31.0 10/23/2016 0349   MCHC 33.2 10/23/2016 0349   RDW 19.9 (H) 10/23/2016 0349   RDW 13.8 11/29/2011 0614   LYMPHSABS 0.7 08/07/2016 0212   LYMPHSABS 0.9 (L)  11/29/2011 0614   MONOABS 0.8 08/07/2016 0212   MONOABS 0.3 11/29/2011 0614   EOSABS 0.0 08/07/2016 0212   EOSABS 0.0 11/29/2011 0614   BASOSABS 0.0 08/07/2016 0212   BASOSABS 0.0 11/29/2011 0614    BMET    Component Value Date/Time   NA 138 10/23/2016 0349   NA 138 10/15/2011 1339   K 4.9 10/23/2016 0349   K 4.5 11/28/2011 1340   CL 100 (L) 10/23/2016 0349   CL 104 10/15/2011 1339   CO2 23 10/23/2016 0349   CO2 24 10/15/2011 1339   GLUCOSE 198 (H) 10/23/2016 0349   GLUCOSE 121 (H) 10/15/2011 1339   BUN 54 (H) 10/23/2016 0349   BUN 47 (H) 10/15/2011 1339   CREATININE 8.21 (H) 10/23/2016 0349   CREATININE 3.88 (H) 10/15/2011 1339   CALCIUM 8.1 (L) 10/23/2016 0349   CALCIUM 8.1 (L) 10/15/2011 1339   GFRNONAA 5 (L) 10/23/2016 0349   GFRNONAA 14 (L) 10/15/2011 1339   GFRAA 6 (L) 10/23/2016 0349   GFRAA 17 (L) 10/15/2011 1339     Discharge Instructions:   The patient is discharged to SNF with extensive instructions on wound care and progressive ambulation.  They are instructed not to drive or perform any heavy lifting until returning to see the physician in his office.  Discharge Instructions    Call MD  for:  redness, tenderness, or signs of infection (pain, swelling, bleeding, redness, odor or green/yellow discharge around incision site)    Complete by:  As directed    Call MD for:  severe or increased pain, loss or decreased feeling  in affected limb(s)    Complete by:  As directed    Call MD for:  temperature >100.5    Complete by:  As directed    Discharge wound care:    Complete by:  As directed    Wash the groin wound with soap and water daily and pat dry. (No tub bath-only shower)  Then put a dry gauze or washcloth there to keep this area dry daily and as needed.  Do not use Vaseline or neosporin on your incisions.  Only use soap and water on your incisions and then protect and keep dry.   Driving Restrictions    Complete by:  As directed    No driving while on  pain medication   Increase activity slowly    Complete by:  As directed    Walk with assistance use walker or cane as needed   Lifting restrictions    Complete by:  As directed    No lifting for 2 weeks   Resume previous diet    Complete by:  As directed       Discharge Diagnosis:  End Stage Renal Disease N18.6  Secondary Diagnosis: Patient Active Problem List   Diagnosis Date Noted  . ESRD (end stage renal disease) on dialysis (HCC) 10/22/2016  . Venous hypertension status post AVG procedure 08/10/2016  . Anasarca 08/04/2016  . Facial swelling 08/04/2016  . Effusion, other site 08/04/2016  . 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 Acute respiratory failure (HCC) 08/04/2016  . Hyperkalemia 08/04/2016  . Aftercare following surgery of the circulatory system, NEC-Right  AVGG 09/25/2012  . Other complications due to renal dialysis device, implant, and graft 05/01/2012  . End stage renal disease (HCC) 03/17/2012  . FATIGUE, ACUTE 08/22/2008  . HEMATURIA UNSPECIFIED 07/11/2008  . Diabetes (HCC) 01/19/2008  . Essential hypertension 01/19/2008  . MYOCARDIAL INFARCTION, HX OF 01/19/2008  . GERD 01/19/2008  . OSTEOARTHRITIS 01/19/2008  . CEREBROVASCULAR ACCIDENT, HX OF 01/19/2008   Past Medical History:  Diagnosis Date  . Diabetes mellitus without complication (HCC)    lantus daily and humalog sliding scale, type2  . Dizziness   . ESRD (end stage renal disease) on dialysis (HCC)    "[facility]; MWF" (10/22/2016)  . History of blood transfusion   . Hypertension    takes Amlodipine every other day  . Myocardial infarction (HCC)   . Pneumonia    last yr  . Stroke [facility])      Allergies as of 10/23/2016      Reactions   Lisinopril Other (See Comments)   REACTION: hyperkalemia/renal deterioration      Medication List    TAKE these medications   aspirin EC 325 MG tablet Take 325-650 mg by mouth every 8 (eight) hours as needed (for pain.).   calcium carbonate 750 MG chewable  tablet Commonly known as:  TUMS EX Chew 1 tablet by mouth 2 (two) times daily as needed for heartburn.   clindamycin 300 MG capsule Commonly known as:  CLEOCIN Take 300 mg by mouth 3 (three) times daily.   Darbepoetin Alfa 60 MCG/0.3ML Sosy injection Commonly known as:  ARANESP Inject 0.3 mLs (60 mcg total) into the vein every Friday with hemodialysis.   insulin glargine 100 UNIT/ML injection  Commonly known as:  LANTUS Inject 5 Units into the skin at bedtime. Per BS   latanoprost 0.005 % ophthalmic solution Commonly known as:  XALATAN Place 1 drop into both eyes at bedtime.   losartan 100 MG tablet Commonly known as:  COZAAR Take 100 mg by mouth daily.   multivitamin Tabs tablet Take 1 tablet by mouth daily.   oxyCODONE-acetaminophen 5-325 MG tablet Commonly known as:  PERCOCET/ROXICET Take 1 tablet by mouth every 6 (six) hours as needed.   sevelamer carbonate 0.8 g Pack packet Commonly known as:  RENVELA Take 1.6 g by mouth 3 (three) times daily with meals.   valACYclovir 1000 MG tablet Commonly known as:  VALTREX Take 1,000 mg by mouth daily.       Percocet #20 No Refill  Disposition: SNF  Patient's condition: is Good  Follow up: 1. [name] in 4 weeks   Maris Berger, PA-C [facility] [phone] 10/23/2016  11:27 AM  Addendum  I have independently interviewed and examined the patient, and I agree with the physician assistant's discharge summary.  This patient underwent L femoral thigh AVG placement that was uncomplicated.  Post-operatively, his PT evaluation demonstrated significant loss of function and SNF admission for rehab was recommended.  The patient is discharge to [facility] for SNF.  The patient will follow up in the office in 4 weeks.  I expect the left thigh arteriovenous graft to be healed by then and use of that graft will start, hopefully leading to remove of his right femoral vein tunneled dialysis catheter shortly  after.   [name], MD, FACS [facility] [place] Office: [phone] Pager: [phone]  10/23/2016, 2:59 PM

## 2016-10-23 NOTE — Progress Notes (Signed)
[facility] arrived to pick up the pt with recent vitals obtained along with the dc papers with one rx along with it. Tele was dc and it was called to inform of the action. Reported to the facility by the outgoing staff.

## 2016-10-23 NOTE — Care Management Obs Status (Signed)
MEDICARE OBSERVATION STATUS NOTIFICATION   Patient Details  Name: Charles Vasquez MRN: [id number] Date of Birth: [birth year]   Medicare Observation Status Notification Given:      Unable to review w/ patient, not in room. Anticipate DC prior to 24 hour obs stay.   Lawerance Sabal, RN 10/23/2016, 3:12 PM

## 2016-10-23 NOTE — Evaluation (Signed)
Physical Therapy Evaluation Patient Details Name: Charles Vasquez MRN: [id number] DOB: Aug 29, 1935 Today's Date: 10/23/2016   History of Present Illness  Admitted to hospital post placement of L thigh arteriovenous graft; Unsteady postop, so in hospital overnight for observation;  has a past medical history of Diabetes mellitus without complication (HCC); Dizziness; ESRD (end stage renal disease) on dialysis (HCC); History of blood transfusion; Hypertension; Myocardial infarction (HCC); Pneumonia; and Stroke (HCC). Multiple past surgeries related to HD access.  Clinical Impression  Pt admitted with above diagnosis. Pt currently with functional limitations due to the deficits listed below (see PT Problem List). Lives alone, walked without assistive device, driving occasionally, SCAT to get to HD;  Had to stay with a friend the week leading up to the surgery due to difficulty getting around; Presents with gait and balance dysfunction, heavily dependent on UE support -- very different from PT evaluation in June of this year, and shows quite a functional decline; Recommend SNF for post-acute rehab; worth considering a Neuro Consult while in hospital; [name]  And Erie Noe, LCSW updated; Pt will benefit from skilled PT to increase their independence and safety with mobility to allow discharge to the venue listed below.       Follow Up Recommendations SNF    Equipment Recommendations  Rolling walker with 5" wheels;3in1 (PT)    Recommendations for Other Services OT consult     Precautions / Restrictions Precautions Precautions: Fall      Mobility  Bed Mobility Overal bed mobility: Modified Independent             General bed mobility comments: incr time, use of rails  Transfers Overall transfer level: Needs assistance Equipment used: None;Rolling walker (2 wheeled) Transfers: Sit to/from Stand Sit to Stand: Mod assist;Min assist         General transfer comment: initial attempt to  stand with weight posteriorly, dependent on bracing backs of legs against bed for stability, and heavily dependent on UE support with mod assist for balance; second attempt standing to RW; cues for hand placement and safety, heavy dependence on RW for stability  Ambulation/Gait Ambulation/Gait assistance: Min assist;Mod assist Ambulation Distance (Feet): 60 Feet Assistive device: 1 person hand held assist;Rolling walker (2 wheeled) Gait Pattern/deviations: Step-through pattern;Ataxic;Staggering left;Staggering right     General Gait Details: First part of walk without RW and handheld assist, requiring mod assist for stability, ataxic gait, and tending to reach out for support; better with Bil support from RW; needing cues to keep Rw close and for upright posture; min physical assist for correct RW positioning  Stairs Stairs: Yes Stairs assistance: Mod assist Stair Management: Two rails;Alternating pattern;Step to pattern;Forwards (alternating ascending, step-to descending) Number of Stairs: 4 General stair comments: Very unsteady, heavily dependent on UE support; loss of balance backwards requiring mod assist to prevent fall  Wheelchair Mobility    Modified Rankin (Stroke Patients Only)       Balance Overall balance assessment: Needs assistance Sitting-balance support: Feet supported Sitting balance-Leahy Scale: Good       Standing balance-Leahy Scale: Poor                   Standardized Balance Assessment Standardized Balance Assessment :  (Will consider Berg or DGI next session)           Pertinent Vitals/Pain Pain Assessment: No/denies pain    Home Living Family/patient expects to be discharged to:: Private residence Living Arrangements: Alone Available Help at Discharge: Friend(s);Available PRN/intermittently Type  of Home: Apartment Home Access: Stairs to enter Entrance Stairs-Rails: None Entrance Stairs-Number of Steps: 2 Home Layout: One level Home  Equipment: Walker - standard;Shower seat      Prior Function Level of Independence: Independent         Comments: was completely independent and driving prior to admission in June for retropharyngeal effusion, facial edema, and venous HTN     Hand Dominance   Dominant Hand: Right    Extremity/Trunk Assessment   Upper Extremity Assessment Upper Extremity Assessment: Overall WFL for tasks assessed    Lower Extremity Assessment Lower Extremity Assessment: Generalized weakness;RLE deficits/detail;LLE deficits/detail RLE Coordination: decreased gross motor LLE Coordination: decreased gross motor       Communication   Communication: No difficulties  Cognition Arousal/Alertness: Awake/alert Behavior During Therapy: WFL for tasks assessed/performed Overall Cognitive Status: Impaired/Different from baseline Area of Impairment: Safety/judgement                         Safety/Judgement: Decreased awareness of deficits            General Comments      Exercises     Assessment/Plan    PT Assessment Patient needs continued PT services  PT Problem List Decreased strength;Decreased activity tolerance;Decreased balance;Decreased mobility;Decreased coordination;Decreased cognition;Decreased knowledge of use of DME;Decreased safety awareness;Decreased knowledge of precautions       PT Treatment Interventions DME instruction;Gait training;Stair training;Functional mobility training;Therapeutic activities;Therapeutic exercise;Balance training;Neuromuscular re-education;Cognitive remediation;Patient/family education    PT Goals (Current goals can be found in the Care Plan section)  Acute Rehab PT Goals Patient Stated Goal: Wants to get home; agreeable to amb PT Goal Formulation: With patient Time For Goal Achievement: 11/06/16 Potential to Achieve Goals: Good    Frequency Min 3X/week   Barriers to discharge Inaccessible home environment;Decreased caregiver  support Lives alone, must be modified independent to safely manage; two steps to enter home; no rails    Co-evaluation               AM-PAC PT "6 Clicks" Daily Activity  Outcome Measure Difficulty turning over in bed (including adjusting bedclothes, sheets and blankets)?: None Difficulty moving from lying on back to sitting on the side of the bed? : A Little Difficulty sitting down on and standing up from a chair with arms (e.g., wheelchair, bedside commode, etc,.)?: A Lot Help needed moving to and from a bed to chair (including a wheelchair)?: A Little Help needed walking in hospital room?: A Lot Help needed climbing 3-5 steps with a railing? : A Lot 6 Click Score: 16    End of Session Equipment Utilized During Treatment: Gait belt Activity Tolerance: Patient tolerated treatment well Patient left: in chair;with call bell/phone within reach;with chair alarm set Nurse Communication: Mobility status PT Visit Diagnosis: Unsteadiness on feet (R26.81);Other abnormalities of gait and mobility (R26.89);Muscle weakness (generalized) (M62.81)    Time: 9604-5409 PT Time Calculation (min) (ACUTE ONLY): 32 min   Charges:   PT Evaluation $PT Eval Moderate Complexity: 1 Mod PT Treatments $Gait Training: 8-22 mins   PT G Codes:   PT G-Codes **NOT FOR INPATIENT CLASS** Functional Assessment Tool Used: Clinical judgement Functional Limitation: Mobility: Walking and moving around Mobility: Walking and Moving Around Current Status (W1191): At least 20 percent but less than 40 percent impaired, limited or restricted Mobility: Walking and Moving Around Goal Status [phone]): 0 percent impaired, limited or restricted    Van Clines, PT  Acute Rehabilitation Services Pager  [phone] Office [phone]   Levi Aland 10/23/2016, 10:09 AM

## 2016-10-23 NOTE — Progress Notes (Signed)
Charles Vasquez to be D/C'd Rehab per MD order.  Discussed prescriptions and follow up appointments with the patient. Prescriptions given to patient, medication list explained in detail. Pt verbalized understanding.  Allergies as of 10/23/2016      Reactions   Lisinopril Other (See Comments)   REACTION: hyperkalemia/renal deterioration      Medication List    TAKE these medications   aspirin EC 325 MG tablet Take 325-650 mg by mouth every 8 (eight) hours as needed (for pain.).   calcium carbonate 750 MG chewable tablet Commonly known as:  TUMS EX Chew 1 tablet by mouth 2 (two) times daily as needed for heartburn.   clindamycin 300 MG capsule Commonly known as:  CLEOCIN Take 300 mg by mouth 3 (three) times daily.   Darbepoetin Alfa 60 MCG/0.3ML Sosy injection Commonly known as:  ARANESP Inject 0.3 mLs (60 mcg total) into the vein every Friday with hemodialysis.   insulin glargine 100 UNIT/ML injection Commonly known as:  LANTUS Inject 5 Units into the skin at bedtime. Per BS   latanoprost 0.005 % ophthalmic solution Commonly known as:  XALATAN Place 1 drop into both eyes at bedtime.   losartan 100 MG tablet Commonly known as:  COZAAR Take 100 mg by mouth daily.   multivitamin Tabs tablet Take 1 tablet by mouth daily.   oxyCODONE-acetaminophen 5-325 MG tablet Commonly known as:  PERCOCET/ROXICET Take 1 tablet by mouth every 6 (six) hours as needed.   sevelamer carbonate 0.8 g Pack packet Commonly known as:  RENVELA Take 1.6 g by mouth 3 (three) times daily with meals.   valACYclovir 1000 MG tablet Commonly known as:  VALTREX Take 1,000 mg by mouth daily.       Vitals:   10/23/16 1700 10/23/16 1705  BP: 137/65 (!) 143/59  Pulse: 70 68  Resp: 16 17  Temp:  98.2 F (36.8 C)  SpO2: 99% 97%    Skin clean, dry and intact without evidence of skin break down, no evidence of skin tears noted. IV catheter discontinued intact. Site without signs and symptoms of  complications. Dressing and pressure applied. Pt denies pain at this time. No complaints noted.  An After Visit Summary was printed and given to the patient. Patient escorted via stretcher and D/C to Rehab via [facility].  Nelma Rothman, RN [facility]west Phone [phone]

## 2016-10-23 NOTE — NC FL2 (Signed)
[place] MEDICAID FL2 LEVEL OF CARE SCREENING TOOL     IDENTIFICATION  Patient Name: Charles Vasquez Birthdate: 31-Dec-1935 Sex: male Admission Date (Current Location): 10/22/2016  [facility] and [place] Number:  [employer] and Address:  [facility] [place], [address] [place], [place] [address]      Provider Number: [id number]  Attending Physician Name and Address:  [name], MD  Relative Name and Phone Number:  Collene Leyden - friend; [phone] (mobile) and [phone] (h)    Current Level of Care: Hospital Recommended Level of Care: Skilled Nursing Facility Prior Approval Number:    Date Approved/Denied:   PASRR Number: [id number] (Eff. 03/13/11)  Discharge Plan: SNF    Current Diagnoses: Patient Active Problem List   Diagnosis Date Noted  . ESRD (end stage renal disease) on dialysis (HCC) 10/22/2016  . Venous hypertension status post AVG procedure 08/10/2016  . Anasarca 08/04/2016  . Facial swelling 08/04/2016  . Effusion, other site 08/04/2016  . 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 Acute respiratory failure (HCC) 08/04/2016  . Hyperkalemia 08/04/2016  . Aftercare following surgery of the circulatory system, NEC-Right  AVGG 09/25/2012  . Other complications due to renal dialysis device, implant, and graft 05/01/2012  . End stage renal disease (HCC) 03/17/2012  . FATIGUE, ACUTE 08/22/2008  . HEMATURIA UNSPECIFIED 07/11/2008  . Diabetes (HCC) 01/19/2008  . Essential hypertension 01/19/2008  . MYOCARDIAL INFARCTION, HX OF 01/19/2008  . GERD 01/19/2008  . OSTEOARTHRITIS 01/19/2008  . CEREBROVASCULAR ACCIDENT, HX OF 01/19/2008    Orientation RESPIRATION BLADDER Height & Weight     Self, Time, Situation, Place  Normal Continent Weight: 144 lb (65.3 kg) Height:  5\' 7"  (170.2 cm)  BEHAVIORAL SYMPTOMS/MOOD NEUROLOGICAL BOWEL NUTRITION STATUS      Continent Diet (renal/carb modified with fluid restriction)  AMBULATORY STATUS  COMMUNICATION OF NEEDS Skin   Extensive Assist Verbally Other (Comment) (Incision left thigh and groin)                       Personal Care Assistance Level of Assistance  Bathing, Feeding, Dressing Bathing Assistance: Limited assistance Feeding assistance: Independent Dressing Assistance: Limited assistance     Functional Limitations Info  Sight, Hearing, Speech Sight Info: Adequate Hearing Info: Adequate Speech Info: Adequate    SPECIAL CARE FACTORS FREQUENCY  PT (By licensed PT)     PT Frequency: Evaluated 8/15 and a minimum of 3X per week therapy recommended              Contractures Contractures Info: Not present    Additional Factors Info  Code Status, Allergies, Insulin Sliding Scale Code Status Info: Full Allergies Info: Lisinopril   Insulin Sliding Scale Info: 0-9 Units 3X per day with meals       Current Medications (10/23/2016):  This is the current hospital active medication list Current Facility-Administered Medications  Medication Dose Route Frequency Provider Last Rate Last Dose  . 0.9 %  sodium chloride infusion  250 mL Intravenous PRN Raymond Gurney, PA-C      . acetaminophen (TYLENOL) tablet 325-650 mg  325-650 mg Oral Q4H PRN Raymond Gurney, PA-C   650 mg at 10/23/16 0534   Or  . acetaminophen (TYLENOL) suppository 325-650 mg  325-650 mg Rectal Q4H PRN Maris Berger A, PA-C      . alum & mag hydroxide-simeth (MAALOX/MYLANTA) 200-200-20 MG/5ML suspension 15-30 mL  15-30 mL Oral Q2H PRN Raymond Gurney, PA-C      .  aspirin EC tablet 325-650 mg  325-650 mg Oral Q8H PRN Raymond Gurney, PA-C      . calcium carbonate (TUMS - dosed in mg elemental calcium) chewable tablet 300 mg of elemental calcium  1.5 tablet Oral BID PRN Maris Berger A, PA-C      . clindamycin (CLEOCIN) capsule 300 mg  300 mg Oral TID Maris Berger A, PA-C   300 mg at 10/23/16 1019  . guaiFENesin-dextromethorphan (ROBITUSSIN DM) 100-10 MG/5ML syrup 15 mL  15 mL  Oral Q4H PRN Maris Berger A, PA-C      . heparin injection 5,000 Units  5,000 Units Subcutaneous Q8H Raymond Gurney, PA-C   5,000 Units at 10/23/16 0535  . hydrALAZINE (APRESOLINE) injection 5 mg  5 mg Intravenous Q20 Min PRN Maris Berger A, PA-C      . insulin aspart (novoLOG) injection 0-9 Units  0-9 Units Subcutaneous TID WC Trinh, Kimberly A, PA-C      . insulin glargine (LANTUS) injection 5 Units  5 Units Subcutaneous QHS Maris Berger A, PA-C   5 Units at 10/22/16 2140  . labetalol (NORMODYNE,TRANDATE) injection 10 mg  10 mg Intravenous Q10 min PRN Maris Berger A, PA-C      . latanoprost (XALATAN) 0.005 % ophthalmic solution 1 drop  1 drop Both Eyes QHS Maris Berger A, PA-C   1 drop at 10/22/16 2141  . losartan (COZAAR) tablet 100 mg  100 mg Oral Daily Maris Berger A, PA-C   100 mg at 10/23/16 1018  . metoprolol tartrate (LOPRESSOR) injection 2-5 mg  2-5 mg Intravenous Q2H PRN Maris Berger A, PA-C      . morphine 2 MG/ML injection 2-5 mg  2-5 mg Intravenous Q1H PRN Maris Berger A, PA-C      . multivitamin (RENA-VIT) tablet 1 tablet  1 tablet Oral Daily Maris Berger A, PA-C   1 tablet at 10/23/16 1018  . ondansetron (ZOFRAN) injection 4 mg  4 mg Intravenous Q6H PRN Maris Berger A, PA-C      . oxyCODONE-acetaminophen (PERCOCET/ROXICET) 5-325 MG per tablet 1-2 tablet  1-2 tablet Oral Q4H PRN Maris Berger A, PA-C      . pantoprazole (PROTONIX) EC tablet 40 mg  40 mg Oral Daily Maris Berger A, PA-C   40 mg at 10/23/16 1019  . phenol (CHLORASEPTIC) mouth spray 1 spray  1 spray Mouth/Throat PRN Maris Berger A, PA-C      . potassium chloride SA (K-DUR,KLOR-CON) CR tablet 20-40 mEq  20-40 mEq Oral Once Maris Berger A, PA-C      . sodium chloride flush (NS) 0.9 % injection 3 mL  3 mL Intravenous Q12H Maris Berger A, PA-C   3 mL at 10/22/16 2142  . sodium chloride flush (NS) 0.9 % injection 3 mL  3 mL Intravenous PRN Maris Berger A, PA-C      .  valACYclovir (VALTREX) tablet 1,000 mg  1,000 mg Oral Daily Maris Berger A, PA-C   1,000 mg at 10/23/16 1018     Discharge Medications: Please see discharge summary for a list of discharge medications.  Relevant Imaging Results:  Relevant Lab Results:   Additional Information ss#[id number]. Dialysis patient MWF. Chair time 6:00/6:10 am & arrives at HD center 5:45 am   Okey Dupre, Lazaro Arms, LCSW   [name], MD, FACS [facility] Office: [phone] Pager: [phone]  10/23/2016, 11:52 AM

## 2016-10-23 NOTE — Consult Note (Signed)
[facility] Renal Consultation Note    Indication for Consultation:  Management of ESRD/hemodialysis; anemia, hypertension/volume and secondary hyperparathyroidism PCP:  HPI: Charles Vasquez is a 81 y.o. male with ESRD on hemodialysis MWF at [facility]. Usually followed by Dr. Kristian Covey. PMH significant for DM, HTN, CVA, PNA. 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 He presented to [facility] for placement of L thigh AVG per [name] which was completed 10/22/16. In interval, significant other was concerned about his living alone and has asked for SNF placement for rehab.  He has been admitted post procedure as observation patient pending SNF placement. Social Work has been consulted.  Currently, he is sitting up in chair without complaints. L thigh AVG + thrill + bruit. No C/Os. Denies pain, fever, chills, N,V,D, chest pain, SOB, DOE, abdominal pain/flank pain, HA, dizziness, vision or hearing changes. He has been up walking with PT.   Have contacted [name] for HD orders. Will have HD today on schedule.   Past Medical History:  Diagnosis Date  . Diabetes mellitus without complication (HCC)    lantus daily and humalog sliding scale, type2  . Dizziness   . ESRD (end stage renal disease) on dialysis (HCC)    "[facility]; MWF" (10/22/2016)  . History of blood transfusion   . Hypertension    takes Amlodipine every other day  . Myocardial infarction (HCC)   . Pneumonia    last yr  . Stroke [facility])    Past Surgical History:  Procedure Laterality Date  . ARTERIOVENOUS GRAFT PLACEMENT    . ARTERIOVENOUS GRAFT PLACEMENT Left 10/22/2016   thigh  . AV FISTULA PLACEMENT  03/31/2012   Procedure: ARTERIOVENOUS (AV) FISTULA CREATION;  Surgeon: [name], MD;  Location: [facility] OR;  Service: Vascular;  Laterality: Right;  First stage Brachial vein transposition  . AV FISTULA PLACEMENT Right 08/25/2012   Procedure: INSERTION OF ARTERIOVENOUS (AV) GORE-TEX GRAFT ARM;  Surgeon: [name], MD;  Location: [facility] OR;  Service: Vascular;  Laterality: Right;  . AV FISTULA PLACEMENT Left 07/23/2016   Procedure: INSERTION OF ARTERIOVENOUS (AV) GORE-TEX GRAFT LEFT UPPER ARM;  Surgeon: [name], MD;  Location: [facility] OR;  Service: Vascular;  Laterality: Left;  . AV FISTULA PLACEMENT Left 10/22/2016   Procedure: INSERTION OF 4-63mm x 45cm  ARTERIOVENOUS (AV) GORE-TEX GRAFT THIGH-LEFT;  Surgeon: [name], MD;  Location: [facility] OR;  Service: Vascular;  Laterality: Left;  . EYE SURGERY Bilateral    cataracts  . INSERTION OF DIALYSIS CATHETER Left 05/05/2012   Procedure: INSERTION OF DIALYSIS CATHETER;  Surgeon: Chuck Hint, MD;  Location: [facility] OR;  Service: Vascular;  Laterality: Left;  . INSERTION OF DIALYSIS CATHETER Right 10/01/2016   Procedure: INSERTION OF DIALYSIS CATHETER- RIGHT FEMORAL;  Surgeon: [name], MD;  Location: [facility] OR;  Service: Vascular;  Laterality: Right;  . IR FLUORO GUIDE CV LINE RIGHT  06/10/2016  . IR GENERIC HISTORICAL  06/07/2016   IR US GUIDE VASC ACCESS RIGHT 06/07/2016 Oley Balm, MD [facility]-INTERV RAD  . IR THROMBECTOMY AV FISTULA W/THROMBOLYSIS/PTA INC/SHUNT/IMG RIGHT Right 06/07/2016  . IR US GUIDE VASC ACCESS RIGHT  06/10/2016  . LIGATION ARTERIOVENOUS GORTEX GRAFT Left 08/04/2016   Procedure: LIGATION ARTERIOVENOUS GORTEX GRAFT;  Surgeon: Larina Earthly, MD;  Location: [facility] OR;  Service: Vascular;  Laterality: Left;  . LIGATION OF ARTERIOVENOUS  FISTULA Right 08/25/2012   Procedure: LIGATION OF ARTERIOVENOUS  FISTULA;  Surgeon: [name],  MD;  Location: [facility] OR;  Service: Vascular;  Laterality: Right;  Ligation of right brachial vein transposition  . REMOVAL OF A DIALYSIS CATHETER Right 05/05/2012   Procedure: REMOVAL OF A DIALYSIS CATHETER;  Surgeon: Chuck Hint, MD;  Location: [facility] OR;  Service: Vascular;  Laterality: Right;  . REMOVAL OF A DIALYSIS CATHETER Right 10/01/2016   Procedure: REMOVAL OF A DIALYSIS CATHETER-RIGHT UPPER CHEST;  Surgeon:  [name], MD;  Location: [facility] OR;  Service: Vascular;  Laterality: Right;  . TOE AMPUTATION    . UPPER EXTREMITY VENOGRAPHY Bilateral 07/18/2016   Procedure: Bilateral Upper Extremity Venography;  Surgeon: [name], MD;  Location: [facility] INVASIVE CV LAB;  Service: Cardiovascular;  Laterality: Bilateral;   Family History  Problem Relation Age of Onset  . Diabetes Mother   . Hypertension Mother   . Diabetes Father   . Hypertension Father    Social History:  reports that he has never smoked. He has never used smokeless tobacco. He reports that he does not drink alcohol or use drugs. Allergies  Allergen Reactions  . Lisinopril Other (See Comments)    REACTION: hyperkalemia/renal deterioration   Prior to Admission medications   Medication Sig Start Date End Date Taking? Authorizing Provider  aspirin EC 325 MG tablet Take 325-650 mg by mouth every 8 (eight) hours as needed (for pain.).   Yes [provider]  calcium carbonate (TUMS EX) 750 MG chewable tablet Chew 1 tablet by mouth 2 (two) times daily as needed for heartburn.   Yes [provider]  clindamycin (CLEOCIN) 300 MG capsule Take 300 mg by mouth 3 (three) times daily. 10/15/16  Yes [provider]  insulin glargine (LANTUS) 100 UNIT/ML injection Inject 5 Units into the skin at bedtime. Per BS   Yes [provider]  latanoprost (XALATAN) 0.005 % ophthalmic solution Place 1 drop into both eyes at bedtime.   Yes [provider]  losartan (COZAAR) 100 MG tablet Take 100 mg by mouth daily.   Yes [provider]  multivitamin (RENA-VIT) TABS tablet Take 1 tablet by mouth daily.   Yes [provider]  valACYclovir (VALTREX) 1000 MG tablet Take 1,000 mg by mouth daily.    Yes [provider]  Darbepoetin Alfa (ARANESP) 60 MCG/0.3ML SOSY injection Inject 0.3 mLs (60 mcg total) into the vein every Friday with hemodialysis. Patient not taking: Reported on 10/21/2016 08/16/16    Rama, Maryruth Bun, MD  oxyCODONE-acetaminophen (PERCOCET/ROXICET) 5-325 MG tablet Take 1 tablet by mouth every 6 (six) hours as needed. 10/22/16   Raymond Gurney, PA-C  sevelamer carbonate (RENVELA) 0.8 g PACK packet Take 1.6 g by mouth 3 (three) times daily with meals. Patient not taking: Reported on 09/30/2016 08/11/16   Rama, Maryruth Bun, MD   Current Facility-Administered Medications  Medication Dose Route Frequency Provider Last Rate Last Dose  . 0.9 %  sodium chloride infusion  250 mL Intravenous PRN Raymond Gurney, PA-C      . acetaminophen (TYLENOL) tablet 325-650 mg  325-650 mg Oral Q4H PRN Raymond Gurney, PA-C   650 mg at 10/23/16 0534   Or  . acetaminophen (TYLENOL) suppository 325-650 mg  325-650 mg Rectal Q4H PRN Raymond Gurney, PA-C      . alum & mag hydroxide-simeth (MAALOX/MYLANTA) 200-200-20 MG/5ML suspension 15-30 mL  15-30 mL Oral Q2H PRN Raymond Gurney, PA-C      . aspirin EC tablet 325-650 mg  325-650 mg Oral  Q8H PRN Raymond Gurney, PA-C      . calcium carbonate (TUMS - dosed in mg elemental calcium) chewable tablet 300 mg of elemental calcium  1.5 tablet Oral BID PRN Maris Berger A, PA-C      . clindamycin (CLEOCIN) capsule 300 mg  300 mg Oral TID Maris Berger A, PA-C   300 mg at 10/23/16 1019  . guaiFENesin-dextromethorphan (ROBITUSSIN DM) 100-10 MG/5ML syrup 15 mL  15 mL Oral Q4H PRN Maris Berger A, PA-C      . heparin injection 5,000 Units  5,000 Units Subcutaneous Q8H Raymond Gurney, PA-C   5,000 Units at 10/23/16 0535  . hydrALAZINE (APRESOLINE) injection 5 mg  5 mg Intravenous Q20 Min PRN Maris Berger A, PA-C      . insulin aspart (novoLOG) injection 0-9 Units  0-9 Units Subcutaneous TID WC Trinh, Kimberly A, PA-C      . insulin glargine (LANTUS) injection 5 Units  5 Units Subcutaneous QHS Maris Berger A, PA-C   5 Units at 10/22/16 2140  . labetalol (NORMODYNE,TRANDATE) injection 10 mg  10 mg Intravenous Q10 min PRN Maris Berger A,  PA-C      . latanoprost (XALATAN) 0.005 % ophthalmic solution 1 drop  1 drop Both Eyes QHS Maris Berger A, PA-C   1 drop at 10/22/16 2141  . losartan (COZAAR) tablet 100 mg  100 mg Oral Daily Maris Berger A, PA-C   100 mg at 10/23/16 1018  . metoprolol tartrate (LOPRESSOR) injection 2-5 mg  2-5 mg Intravenous Q2H PRN Maris Berger A, PA-C      . morphine 2 MG/ML injection 2-5 mg  2-5 mg Intravenous Q1H PRN Maris Berger A, PA-C      . multivitamin (RENA-VIT) tablet 1 tablet  1 tablet Oral Daily Maris Berger A, PA-C   1 tablet at 10/23/16 1018  . ondansetron (ZOFRAN) injection 4 mg  4 mg Intravenous Q6H PRN Maris Berger A, PA-C      . oxyCODONE-acetaminophen (PERCOCET/ROXICET) 5-325 MG per tablet 1-2 tablet  1-2 tablet Oral Q4H PRN Maris Berger A, PA-C      . pantoprazole (PROTONIX) EC tablet 40 mg  40 mg Oral Daily Maris Berger A, PA-C   40 mg at 10/23/16 1019  . phenol (CHLORASEPTIC) mouth spray 1 spray  1 spray Mouth/Throat PRN Maris Berger A, PA-C      . potassium chloride SA (K-DUR,KLOR-CON) CR tablet 20-40 mEq  20-40 mEq Oral Once Maris Berger A, PA-C      . sodium chloride flush (NS) 0.9 % injection 3 mL  3 mL Intravenous Q12H Maris Berger A, PA-C   3 mL at 10/22/16 2142  . sodium chloride flush (NS) 0.9 % injection 3 mL  3 mL Intravenous PRN Raymond Gurney, PA-C      . valACYclovir (VALTREX) tablet 1,000 mg  1,000 mg Oral Daily Maris Berger A, PA-C   1,000 mg at 10/23/16 1018   Labs: Basic Metabolic Panel:  Recent Labs Lab 10/22/16 0848 10/22/16 2144 10/23/16 0349  NA 137  --  138  K 4.2  --  4.9  CL  --   --  100*  CO2  --   --  23  GLUCOSE 135*  --  198*  BUN  --   --  54*  CREATININE  --  7.57* 8.21*  CALCIUM  --   --  8.1*   CBC:  Recent Labs Lab 10/22/16 1610 10/22/16 0848 10/23/16 0349  WBC 5.5  --  7.4  HGB 13.2 11.6* 10.5*  HCT 38.6* 34.0* 31.6*  MCV 93.0  --  93.2  PLT 69*  --  157   CBG:  Recent Labs Lab  10/22/16 1051 10/22/16 1348 10/22/16 1741 10/22/16 2036 10/23/16 0900  GLUCAP 119* 113* 180* 279* 145*    ROS: As per HPI otherwise negative.   Physical Exam: Vitals:   10/22/16 1809 10/22/16 2058 10/23/16 0524 10/23/16 1108  BP: (!) 174/66 (!) 175/67 (!) 138/48 (!) 121/48  Pulse: 84 80 74 71  Resp: 18 18 18 18   Temp: 98.1 F (36.7 C) 98.5 F (36.9 C) 99.4 F (37.4 C) 98 F (36.7 C)  TempSrc:  Oral Oral Oral  SpO2: 100% 100% 95% 100%  Weight:      Height:         General: Elderly AA male in NAD.  Head: Normocephalic, atraumatic, sclera non-icteric, mucus membranes are moist Neck: Supple. JVD not elevated. Lungs: Clear bilaterally to auscultation without wheezes, rales, or rhonchi. Breathing is unlabored. Heart: RRR with S1 S2. No murmurs, rubs, or gallops appreciated. Abdomen: Soft, non-tender, non-distended with normoactive bowel sounds. No rebound/guarding. No obvious abdominal masses. M-S:  Strength and tone appear normal for age. Lower extremities:without edema or ischemic changes, no open wounds  Neuro: Alert and oriented X 3. Moves all extremities spontaneously. Psych:  Responds to questions appropriately with a normal affect. Dialysis Access: R Femoral TDC Drsg intact. L thigh AVG + bruit  Dialysis Orders: [name] T, Th, S 3.25 hr 64 kg 2.0 K/ 2.5 Ca  350/600 -Heparin infusion: 2000 units IV load, 500 units per hour -Hectorol 4 mcg IV TIW -Epogen 2200 units IV TIW  Assessment/Plan: 1.  Placement L thigh AVG: Per [facility]. Was to be Dc'd home, now adm as obs pt pending SNF placement.  2.  ESRD -  T,Th,S HD today K+ 4.9 Usual heparin dose.  3.  Hypertension/volume  - BP controlled. On losartan 100 mg PO daliy. Last wt 65.3 kg Attempt UFG 1.5- 2 liters to EDW 64 kg. 4.  Anemia  - HGB 10.5. Usually gets Epogen but not on formulary here. Monitor HGB for now.  5.  Metabolic bone disease -  Continue binders/VDRA 6.  Nutrition - Renal/Carb mod diet 7. DM: per  primary  Rita H. Manson Passey, NP-C 10/23/2016, 11:48 AM  [employer] [phone]  I have seen and examined this patient and agree with plan and assessment in the above note with renal recommendations/intervention highlighted.  Pt feels "lowsy" and very weak.  S/p left thigh AVG placement, currently using right femoral HD cath.  Awaiting disposition. Jomarie Longs A Emalina Dubreuil,MD 10/23/2016 1:41 PM

## 2016-10-23 NOTE — Procedures (Signed)
I was present at this dialysis session. I have reviewed the session itself and made appropriate changes.   Filed Weights   10/22/16 0838  Weight: 65.3 kg (144 lb)     Recent Labs Lab 10/23/16 0349  NA 138  K 4.9  CL 100*  CO2 23  GLUCOSE 198*  BUN 54*  CREATININE 8.21*  CALCIUM 8.1*     Recent Labs Lab 10/22/16 0842 10/22/16 0848 10/23/16 0349  WBC 5.5  --  7.4  HGB 13.2 11.6* 10.5*  HCT 38.6* 34.0* 31.6*  MCV 93.0  --  93.2  PLT 69*  --  157    Scheduled Meds: . clindamycin  300 mg Oral TID  . [START ON 10/24/2016] doxercalciferol  4 mcg Intravenous Q T,Th,Sa-HD  . heparin  5,000 Units Subcutaneous Q8H  . insulin aspart  0-9 Units Subcutaneous TID WC  . insulin glargine  5 Units Subcutaneous QHS  . latanoprost  1 drop Both Eyes QHS  . losartan  100 mg Oral Daily  . multivitamin  1 tablet Oral Daily  . pantoprazole  40 mg Oral Daily  . potassium chloride  20-40 mEq Oral Once  . sodium chloride flush  3 mL Intravenous Q12H  . valACYclovir  1,000 mg Oral Daily   Continuous Infusions: . sodium chloride     PRN Meds:.sodium chloride, acetaminophen **OR** acetaminophen, alum & mag hydroxide-simeth, aspirin EC, calcium carbonate, guaiFENesin-dextromethorphan, hydrALAZINE, labetalol, metoprolol tartrate, morphine injection, ondansetron, oxyCODONE-acetaminophen, phenol, sodium chloride flush   Irena Cords,  MD 10/23/2016, 1:43 PM

## 2016-10-23 NOTE — Clinical Social Work Note (Signed)
Clinical Social Work Assessment  Patient Details  Name: Charles Vasquez MRN: [id number] Date of Birth: Aug 24, 1935  Date of referral:  10/23/16               Reason for consult:  Facility Placement                Permission sought to share information with:  Family Supports Permission granted to share information::  Yes, Verbal Permission Granted  Name::     Charles Vasquez  Agency::     Relationship::  Friend  Contact Information:  [phone] (h) and [phone] (mobile)  Housing/Transportation Living arrangements for the past 2 months:  Single Family Home Source of Information:  Friend/Neighbor (Ms. Charles Vasquez, friend) Patient Interpreter Needed:  None Criminal Activity/Legal Involvement Pertinent to Current Situation/Hospitalization:  No - Comment as needed Significant Relationships:  Friend (Patient has a son, but is not in contact with him., per patient.) Lives with:  Self Do you feel safe going back to the place where you live?  No (Patient in agreement with rehab before returning home) Need for family participation in patient care:  Yes (Comment) (Friend is involved and help patient out at home)  Care giving concerns:  Patient lives alone and per his friend, Ms. Vasquez, needs rehab as she would not be able to provide the care he needs at this time. Patient expressed agreement with rehab when asked.  Social Worker assessment / plan:  CSW talked with patient and friend, Ms. Vasquez at the bedside regarding PT recommendation of ST rehab and patient nodded agreement. Charles Vasquez was sitting in a chair at bedside and was alert and oriented but quiet during most of the conversation. He allowed his friend, Ms. Vasquez to actively participate in the dialogue, and would nod of briefly speak when asked a question. [facility]) requested as it is close to Ms. Vasquez's home in [place] and patient did not disagree with facility preference.  Employment  status:  Retired Health and safety inspector:  Systems analyst) PT Recommendations:  Skilled Nursing Facility Information / Referral to community resources:  Skilled Nursing Facility (Patient/friend provided with SNF list for [facility])  Patient/Family's Response to care: No concerns expressed regarding care during hospitalization.  Patient/Family's Understanding of and Emotional Response to Diagnosis, Current Treatment, and Prognosis: Not discussed.  Emotional Assessment Appearance:  Appears stated age Attitude/Demeanor/Rapport:  Other (Appropriate) Affect (typically observed):  Appropriate, Quiet Orientation:  Oriented to Self, Oriented to Place, Oriented to  Time, Oriented to Situation Alcohol / Substance use:   (Patient reported that he does not smoke, drink or use illicit drugs) Psych involvement (Current and /or in the community):  No (Comment)  Discharge Needs  Concerns to be addressed:  Discharge Planning Concerns Readmission within the last 30 days:  Yes Current discharge risk:  None Barriers to Discharge:  No Barriers Identified   Charles Goldmann, LCSW 10/23/2016, 4:58 PM

## 2016-10-23 NOTE — Clinical Social Work Placement (Signed)
   CLINICAL SOCIAL WORK PLACEMENT  NOTE 10/23/16 - DISCHARGED TO [facility] VIA [facility].  Date:  10/23/2016  Patient Details  Name: Charles Vasquez MRN: [id number] Date of Birth: 1935-12-01  Clinical Social Work is seeking post-discharge placement for this patient at the Skilled  Nursing Facility level of care (*CSW will initial, date and re-position this form in  chart as items are completed):  Yes   Patient/family provided with [facility] Clinical Social Work Department's list of facilities offering this level of care within the geographic area requested by the patient (or if unable, by the patient's family).  Yes   Patient/family informed of their freedom to choose among providers that offer the needed level of care, that participate in Medicare, Medicaid or managed care program needed by the patient, have an available bed and are willing to accept the patient.  Yes   Patient/family informed of [facility]'s ownership interest in [facility] and [facility], as well as of the fact that they are under no obligation to receive care at these facilities.  PASRR submitted to EDS on       PASRR number received on       Existing PASRR number confirmed on 10/23/16     FL2 transmitted to all facilities in geographic area requested by pt/family on 10/23/16     FL2 transmitted to all facilities within larger geographic area on       Patient informed that his/her managed care company has contracts with or will negotiate with certain facilities, including the following:        Yes   Patient/family informed of bed offers received.  Patient chooses bed at [facility])     Physician recommends and patient chooses bed at      Patient to be transferred to [facility] on 10/23/16.  Patient to be transferred to facility by Ambulance     Patient family notified on 10/23/16  of transfer.  Name of family member notified:  Collene Leyden, friend at the bedside     PHYSICIAN       Additional Comment:    _______________________________________________ Cristobal Goldmann, LCSW 10/23/2016, 5:05 PM

## 2016-10-23 NOTE — Progress Notes (Signed)
   Daily Progress Note   Assessment/Planning:   POD #1 s/p L thigh AVG   No evidence of steal  D/C after PT evaluation completed   Subjective  - 1 Day Post-Op   No complaints   Objective   Vitals:   10/22/16 1730 10/22/16 1809 10/22/16 2058 10/23/16 0524  BP: (!) 163/69 (!) 174/66 (!) 175/67 (!) 138/48  Pulse: 82 84 80 74  Resp: 16 18 18 18   Temp: 97.7 F (36.5 C) 98.1 F (36.7 C) 98.5 F (36.9 C) 99.4 F (37.4 C)  TempSrc:   Oral Oral  SpO2: 98% 100% 100% 95%  Weight:      Height:         Intake/Output Summary (Last 24 hours) at 10/23/16 0811 Last data filed at 10/23/16 0600  Gross per 24 hour  Intake              470 ml  Output              150 ml  Net              320 ml    PULM  CTAB  CV  RRR  GI  soft, NTND  VASC Palpable thrill and bruit in L thigh, palpable DP  NEURO Motor and sensation intact in L leg    Laboratory   CBC CBC Latest Ref Rng & Units 10/23/2016 10/22/2016 10/22/2016  WBC 4.0 - 10.5 K/uL 7.4 - 5.5  Hemoglobin 13.0 - 17.0 g/dL 10.5(L) 11.6(L) 13.2  Hematocrit 39.0 - 52.0 % 31.6(L) 34.0(L) 38.6(L)  Platelets 150 - 400 K/uL PENDING - 69(L)    BMET    Component Value Date/Time   NA 138 10/23/2016 0349   NA 138 10/15/2011 1339   K 4.9 10/23/2016 0349   K 4.5 11/28/2011 1340   CL 100 (L) 10/23/2016 0349   CL 104 10/15/2011 1339   CO2 23 10/23/2016 0349   CO2 24 10/15/2011 1339   GLUCOSE 198 (H) 10/23/2016 0349   GLUCOSE 121 (H) 10/15/2011 1339   BUN 54 (H) 10/23/2016 0349   BUN 47 (H) 10/15/2011 1339   CREATININE 8.21 (H) 10/23/2016 0349   CREATININE 3.88 (H) 10/15/2011 1339   CALCIUM 8.1 (L) 10/23/2016 0349   CALCIUM 8.1 (L) 10/15/2011 1339   GFRNONAA 5 (L) 10/23/2016 0349   GFRNONAA 14 (L) 10/15/2011 1339   GFRAA 6 (L) 10/23/2016 0349   GFRAA 17 (L) 10/15/2011 1339     [name], MD, FACS [facility] Office: [phone] Pager: [phone]  10/23/2016, 8:11 AM

## 2016-10-23 NOTE — Care Management Note (Signed)
Case Management Note  Patient Details  Name: Charles Vasquez MRN: [id number] Date of Birth: 1936-02-21  Subjective/Objective:                 Patient in obs for ESRD, will DC to SNF as facilitated by CSW.    Action/Plan:   Expected Discharge Date:  10/23/16               Expected Discharge Plan:  Skilled Nursing Facility  In-House Referral:  Clinical Social Work  Discharge planning Services  CM Consult  Post Acute Care Choice:    Choice offered to:     DME Arranged:    DME Agency:     HH Arranged:    HH Agency:     Status of Service:  Completed, signed off  If discussed at [employer], dates discussed:    Additional Comments:  Lawerance Sabal, RN 10/23/2016, 3:11 PM

## 2016-11-21 ENCOUNTER — Encounter: Payer: Self-pay | Admitting: Vascular Surgery

## 2016-11-22 ENCOUNTER — Other Ambulatory Visit: Payer: Self-pay

## 2016-11-22 ENCOUNTER — Encounter: Payer: Self-pay | Admitting: Vascular Surgery

## 2016-11-22 ENCOUNTER — Ambulatory Visit (INDEPENDENT_AMBULATORY_CARE_PROVIDER_SITE_OTHER): Payer: Medicare Other | Admitting: Vascular Surgery

## 2016-11-22 VITALS — BP 147/71 | HR 83 | Temp 97.5°F | Wt 135.3 lb

## 2016-11-22 DIAGNOSIS — T82898A Other specified complication of vascular prosthetic devices, implants and grafts, initial encounter: Secondary | ICD-10-CM

## 2016-11-22 DIAGNOSIS — N186 End stage renal disease: Secondary | ICD-10-CM

## 2016-11-22 MED ORDER — OXYCODONE-ACETAMINOPHEN 5-325 MG PO TABS: 1.0000 | ORAL_TABLET | Freq: Four times a day (QID) | ORAL | 0 refills | Status: DC | PRN

## 2016-11-22 NOTE — Progress Notes (Addendum)
Postoperative Access Visit   History of Present Illness   Charles Vasquez is a 81 y.o. year old male who presents for postoperative follow-up for: left thigh arteriovenous graft (Date: 10/22/16).  The patient's wounds are healed.  The patient notes no ischemic sx in L foot.  The patient has had multiple toes amputated on that foot already.  The patient is able to complete their activities of daily living.  The patient's current symptoms are: right hand pain.  He notes pain in R hand with ischemic changes in R 2nd finger.  He also notes weakness and para/anesthesia in the R hand for >1 month, exact chronology unclear.  Past Medical History:  Diagnosis Date  . Diabetes mellitus without complication (HCC)    lantus daily and humalog sliding scale, type2  . Dizziness   . ESRD (end stage renal disease) on dialysis (HCC)    "[facility]; MWF" (10/22/2016)  . History of blood transfusion   . Hypertension    takes Amlodipine every other day  . Myocardial infarction (HCC)   . Pneumonia    last yr  . Stroke [facility])     Past Surgical History:  Procedure Laterality Date  . ARTERIOVENOUS GRAFT PLACEMENT    . ARTERIOVENOUS GRAFT PLACEMENT Left 10/22/2016   thigh  . AV FISTULA PLACEMENT  03/31/2012   Procedure: ARTERIOVENOUS (AV) FISTULA CREATION;  Surgeon: [name], MD;  Location: [facility] OR;  Service: Vascular;  Laterality: Right;  First stage Brachial vein transposition  . AV FISTULA PLACEMENT Right 08/25/2012   Procedure: INSERTION OF ARTERIOVENOUS (AV) GORE-TEX GRAFT ARM;  Surgeon: [name], MD;  Location: [facility] OR;  Service: Vascular;  Laterality: Right;  . AV FISTULA PLACEMENT Left 07/23/2016   Procedure: INSERTION OF ARTERIOVENOUS (AV) GORE-TEX GRAFT LEFT UPPER ARM;  Surgeon: [name], MD;  Location: [facility] OR;  Service: Vascular;  Laterality: Left;  . AV FISTULA PLACEMENT Left 10/22/2016   Procedure: INSERTION OF 4-55mm x 45cm  ARTERIOVENOUS (AV) GORE-TEX GRAFT THIGH-LEFT;  Surgeon: [name], MD;  Location: [facility] OR;  Service: Vascular;  Laterality: Left;  . EYE SURGERY Bilateral    cataracts  . INSERTION OF DIALYSIS CATHETER Left 05/05/2012   Procedure: INSERTION OF DIALYSIS CATHETER;  Surgeon: Chuck Hint, MD;  Location: [facility] OR;  Service: Vascular;  Laterality: Left;  . INSERTION OF DIALYSIS CATHETER Right 10/01/2016   Procedure: INSERTION OF DIALYSIS CATHETER- RIGHT FEMORAL;  Surgeon: [name], MD;  Location: [facility] OR;  Service: Vascular;  Laterality: Right;  . IR FLUORO GUIDE CV LINE RIGHT  06/10/2016  . IR GENERIC HISTORICAL  06/07/2016   IR US GUIDE VASC ACCESS RIGHT 06/07/2016 Oley Balm, MD [facility]-INTERV RAD  . IR THROMBECTOMY AV FISTULA W/THROMBOLYSIS/PTA INC/SHUNT/IMG RIGHT Right 06/07/2016  . IR US GUIDE VASC ACCESS RIGHT  06/10/2016  . LIGATION ARTERIOVENOUS GORTEX GRAFT Left 08/04/2016   Procedure: LIGATION ARTERIOVENOUS GORTEX GRAFT;  Surgeon: Larina Earthly, MD;  Location: [facility] OR;  Service: Vascular;  Laterality: Left;  . LIGATION OF ARTERIOVENOUS  FISTULA Right 08/25/2012   Procedure: LIGATION OF ARTERIOVENOUS  FISTULA;  Surgeon: [name], MD;  Location: [facility] OR;  Service: Vascular;  Laterality: Right;  Ligation of right brachial vein transposition  . REMOVAL OF A DIALYSIS CATHETER Right 05/05/2012   Procedure: REMOVAL OF A DIALYSIS CATHETER;  Surgeon: Chuck Hint, MD;  Location: [facility] OR;  Service: Vascular;  Laterality: Right;  . REMOVAL OF A DIALYSIS  CATHETER Right 10/01/2016   Procedure: REMOVAL OF A DIALYSIS CATHETER-RIGHT UPPER CHEST;  Surgeon: [name], MD;  Location: [facility] OR;  Service: Vascular;  Laterality: Right;  . TOE AMPUTATION    . UPPER EXTREMITY VENOGRAPHY Bilateral 07/18/2016   Procedure: Bilateral Upper Extremity Venography;  Surgeon: [name], MD;  Location: [facility] INVASIVE CV LAB;  Service: Cardiovascular;  Laterality: Bilateral;    Social History   Social History  . Marital status: Widowed    Spouse name: N/A  . Number of  children: N/A  . Years of education: N/A   Occupational History  . Not on file.   Social History Main Topics  . Smoking status: Never Smoker  . Smokeless tobacco: Never Used  . Alcohol use No  . Drug use: No  . Sexual activity: Yes   Other Topics Concern  . Not on file   Social History Narrative  . No narrative on file     Family History  Problem Relation Age of Onset  . Diabetes Mother   . Hypertension Mother   . Diabetes Father   . Hypertension Father     Current Outpatient Prescriptions  Medication Sig Dispense Refill  . aspirin EC 325 MG tablet Take 325-650 mg by mouth every 8 (eight) hours as needed (for pain.).    Marland Kitchen calcium carbonate (TUMS EX) 750 MG chewable tablet Chew 1 tablet by mouth 2 (two) times daily as needed for heartburn.    . clindamycin (CLEOCIN) 300 MG capsule Take 300 mg by mouth 3 (three) times daily.    . clopidogrel (PLAVIX) 75 MG tablet Take 1 tablet by mouth daily.    . Darbepoetin Alfa (ARANESP) 60 MCG/0.3ML SOSY injection Inject 0.3 mLs (60 mcg total) into the vein every Friday with hemodialysis. 4.2 mL   . insulin glargine (LANTUS) 100 UNIT/ML injection Inject 5 Units into the skin at bedtime. Per BS    . latanoprost (XALATAN) 0.005 % ophthalmic solution Place 1 drop into both eyes at bedtime.    Marland Kitchen losartan (COZAAR) 100 MG tablet Take 100 mg by mouth daily.    . multivitamin (RENA-VIT) TABS tablet Take 1 tablet by mouth daily.    . sevelamer carbonate (RENVELA) 0.8 g PACK packet Take 1.6 g by mouth 3 (three) times daily with meals. 270 each 2  . valACYclovir (VALTREX) 1000 MG tablet Take 1,000 mg by mouth daily.     Marland Kitchen oxyCODONE-acetaminophen (PERCOCET/ROXICET) 5-325 MG tablet Take 1-2 tablets by mouth every 6 (six) hours as needed for moderate pain. 20 tablet 0   No current facility-administered medications for this visit.      Allergies  Allergen Reactions  . Lisinopril Other (See Comments)    REACTION: hyperkalemia/renal deterioration      REVIEW OF SYSTEMS (negative unless checked):   Cardiac:  []  Chest pain or chest pressure? []  Shortness of breath upon activity? []  Shortness of breath when lying flat? []  Irregular heart rhythm?  Vascular:  []  Pain in calf, thigh, or hip brought on by walking? []  Pain in feet at night that wakes you up from your sleep? []  Blood clot in your veins? []  Leg swelling?  Pulmonary:  []  Oxygen at home? []  Productive cough? []  Wheezing?  Neurologic:  [x]  Sudden weakness in arms or legs? [x]  Sudden numbness in arms or legs? []  Sudden onset of difficult speaking or slurred speech? []  Temporary loss of vision in one eye? []  Problems with dizziness?  Gastrointestinal:  []  Blood  in stool?  Vomited blood?  Genitourinary:   Burning when urinating?  Blood in urine?  end stage renal disease req HD: M-W-F  Psychiatric:   Major depression  Hematologic:   Bleeding problems?  Problems with blood clotting?  Dermatologic:   Rashes or ulcers?  Constitutional:   Fever or chills?  Ear/Nose/Throat:   Change in hearing?  Nose bleeds?  Sore throat?  Musculoskeletal:   Back pain?  Joint pain?  Muscle pain?   Physical Examination   Vitals:   11/22/16 0959  BP: (!) 147/71  Pulse: 83  Temp: (!) 97.5 F (36.4 C)  TempSrc: Oral  SpO2: 99%  Weight: 135 lb 4.8 oz (61.4 kg)   Body mass index is 21.19 kg/m.  Pulmonary Sym exp, good air movt, CTAB, no rales, rhonchi, & wheezing  Cardiac RRR, Nl S1, S2, no Murmurs, rubs or gallops   Left thigh Incision healed, palpable thrill, +bruit, L foot unchanged without new ulcer or gangrene, no palpable pulses, viable L foot  Right arm +thrill in RUA AVG, no palpable radial or ulnar pulses, R hand grip 3-4/5, R 2nd distal phalange dry gangrene    Medical Decision Making   KAEGAN HETTICH is a 81 y.o. year old male who presents s/p left thigh arteriovenous graft, R hand gangrene with some  contribution from RUA AVG   In regards to the RUA AVG, I am uncertain why it has not been used, but given the left thigh AVG if ready, I don't see that the RUA AVG is contributing anything, so ligation is recommended.  I have also referred the patient to Hand Surgery.    I discussed possible R arm angiogram with the patient and his wife but neither seems willing to proceed given the risk of CVA.  In regards to the L thigh AVG, the patient's access is ready for use.  Will plan on pulling the R femoral TDC at the same time of the RUA AVG ligation Risk, benefits, and alternatives to proposed procedures were discussed.   The patient is aware the risks include but are not limited to: bleeding, infection, steal syndrome, nerve damage,need for additional procedures, death and stroke.   The patient agrees to proceed forward with the procedure.  Thank you for allowing [place] to participate in this patient's care.   [name], MD, FACS [facility] Office: [phone] Pager: [phone]

## 2016-11-25 ENCOUNTER — Encounter (HOSPITAL_COMMUNITY): Payer: Self-pay | Admitting: *Deleted

## 2016-11-25 NOTE — Progress Notes (Signed)
Spoke with Collene Leyden, friend of pt for pre-op. Pt does not talk on the phone. Mrs. Charles Vasquez does not know pt's medical history. I gave her pre-op instructions only. I did instruct her to have Charles Vasquez to take on only 2 units of his Lantus Insulin this evening and for him to check his blood sugar in the AM when he gets up. If blood sugar is 70 or below, treat with 1/2 cup of clear juice (apple or cranberry) and recheck blood sugar 15 minutes after drinking juice. She voiced understanding.

## 2016-11-26 ENCOUNTER — Telehealth: Payer: Self-pay | Admitting: Vascular Surgery

## 2016-11-26 ENCOUNTER — Ambulatory Visit (HOSPITAL_COMMUNITY)
Admission: RE | Admit: 2016-11-26 | Discharge: 2016-11-26 | Disposition: A | Discharge status: Home / Self Care | Payer: Medicare Other | Source: Ambulatory Visit | Attending: Vascular Surgery | Admitting: Vascular Surgery

## 2016-11-26 ENCOUNTER — Ambulatory Visit (HOSPITAL_COMMUNITY): Payer: Medicare Other | Admitting: Certified Registered Nurse Anesthetist

## 2016-11-26 ENCOUNTER — Encounter (HOSPITAL_COMMUNITY): Payer: Self-pay | Admitting: Certified Registered Nurse Anesthetist

## 2016-11-26 ENCOUNTER — Encounter (INDEPENDENT_AMBULATORY_CARE_PROVIDER_SITE_OTHER): Payer: Self-pay | Admitting: Ophthalmology

## 2016-11-26 ENCOUNTER — Encounter (HOSPITAL_COMMUNITY)
Admission: RE | Disposition: A | Discharge status: Home / Self Care | Payer: Self-pay | Source: Ambulatory Visit | Attending: Vascular Surgery

## 2016-11-26 DIAGNOSIS — E1152 Type 2 diabetes mellitus with diabetic peripheral angiopathy with gangrene: Secondary | ICD-10-CM | POA: Insufficient documentation

## 2016-11-26 DIAGNOSIS — I120 Hypertensive chronic kidney disease with stage 5 chronic kidney disease or end stage renal disease: Secondary | ICD-10-CM | POA: Diagnosis not present

## 2016-11-26 DIAGNOSIS — N186 End stage renal disease: Secondary | ICD-10-CM | POA: Insufficient documentation

## 2016-11-26 DIAGNOSIS — Z992 Dependence on renal dialysis: Secondary | ICD-10-CM | POA: Insufficient documentation

## 2016-11-26 DIAGNOSIS — Y832 Surgical operation with anastomosis, bypass or graft as the cause of abnormal reaction of the patient, or of later complication, without mention of misadventure at the time of the procedure: Secondary | ICD-10-CM | POA: Diagnosis not present

## 2016-11-26 DIAGNOSIS — Z794 Long term (current) use of insulin: Secondary | ICD-10-CM | POA: Diagnosis not present

## 2016-11-26 DIAGNOSIS — Z8673 Personal history of transient ischemic attack (TIA), and cerebral infarction without residual deficits: Secondary | ICD-10-CM | POA: Insufficient documentation

## 2016-11-26 DIAGNOSIS — T82898A Other specified complication of vascular prosthetic devices, implants and grafts, initial encounter: Secondary | ICD-10-CM | POA: Diagnosis present

## 2016-11-26 DIAGNOSIS — I252 Old myocardial infarction: Secondary | ICD-10-CM | POA: Diagnosis not present

## 2016-11-26 DIAGNOSIS — E1122 Type 2 diabetes mellitus with diabetic chronic kidney disease: Secondary | ICD-10-CM | POA: Diagnosis not present

## 2016-11-26 DIAGNOSIS — Z89422 Acquired absence of other left toe(s): Secondary | ICD-10-CM | POA: Diagnosis not present

## 2016-11-26 DIAGNOSIS — Z7982 Long term (current) use of aspirin: Secondary | ICD-10-CM | POA: Insufficient documentation

## 2016-11-26 DIAGNOSIS — Z7902 Long term (current) use of antithrombotics/antiplatelets: Secondary | ICD-10-CM | POA: Insufficient documentation

## 2016-11-26 DIAGNOSIS — T82510A Breakdown (mechanical) of surgically created arteriovenous fistula, initial encounter: Secondary | ICD-10-CM | POA: Diagnosis not present

## 2016-11-26 HISTORY — PX: REMOVAL OF A DIALYSIS CATHETER: SHX6053

## 2016-11-26 HISTORY — PX: LIGATION ARTERIOVENOUS GORTEX GRAFT: SHX5947

## 2016-11-26 LAB — POCT I-STAT 4, (NA,K, GLUC, HGB,HCT)
Glucose, Bld: 129 mg/dL — ABNORMAL HIGH (ref 65–99)
HCT: 31 % — ABNORMAL LOW (ref 39.0–52.0)
HEMOGLOBIN: 10.5 g/dL — AB (ref 13.0–17.0)
Potassium: 5 mmol/L (ref 3.5–5.1)
Sodium: 134 mmol/L — ABNORMAL LOW (ref 135–145)

## 2016-11-26 LAB — GLUCOSE, CAPILLARY: GLUCOSE-CAPILLARY: 125 mg/dL — AB (ref 65–99)

## 2016-11-26 SURGERY — LIGATION ARTERIOVENOUS GORTEX GRAFT
Anesthesia: Monitor Anesthesia Care | Site: Leg Upper | Laterality: Right

## 2016-11-26 MED ORDER — LIDOCAINE HCL (PF) 1 % IJ SOLN
INTRAMUSCULAR | Status: AC
  Filled 2016-11-26: qty 30

## 2016-11-26 MED ORDER — MIDAZOLAM HCL 2 MG/2ML IJ SOLN
INTRAMUSCULAR | Status: AC
  Filled 2016-11-26: qty 2

## 2016-11-26 MED ORDER — FENTANYL CITRATE (PF) 100 MCG/2ML IJ SOLN: 25.0000 ug | INTRAMUSCULAR | Status: DC | PRN

## 2016-11-26 MED ORDER — CHLORHEXIDINE GLUCONATE CLOTH 2 % EX PADS: 6.0000 | MEDICATED_PAD | Freq: Once | CUTANEOUS | Status: DC

## 2016-11-26 MED ORDER — OXYCODONE HCL 5 MG/5ML PO SOLN: 5.0000 mg | Freq: Once | ORAL | Status: DC | PRN

## 2016-11-26 MED ORDER — PROPOFOL 10 MG/ML IV BOLUS
INTRAVENOUS | Status: AC
  Filled 2016-11-26: qty 40

## 2016-11-26 MED ORDER — PROPOFOL 500 MG/50ML IV EMUL
INTRAVENOUS | Status: DC | PRN
  Administered 2016-11-26: 50 ug/kg/min via INTRAVENOUS

## 2016-11-26 MED ORDER — 0.9 % SODIUM CHLORIDE (POUR BTL) OPTIME
TOPICAL | Status: DC | PRN
  Administered 2016-11-26: 1000 mL

## 2016-11-26 MED ORDER — ONDANSETRON HCL 4 MG/2ML IJ SOLN: 4.0000 mg | Freq: Four times a day (QID) | INTRAMUSCULAR | Status: DC | PRN

## 2016-11-26 MED ORDER — OXYCODONE HCL 5 MG PO TABS: 5.0000 mg | ORAL_TABLET | Freq: Once | ORAL | Status: DC | PRN

## 2016-11-26 MED ORDER — LABETALOL HCL 5 MG/ML IV SOLN
INTRAVENOUS | Status: AC
  Filled 2016-11-26: qty 4

## 2016-11-26 MED ORDER — LABETALOL HCL 5 MG/ML IV SOLN
10.0000 mg | Freq: Once | INTRAVENOUS | Status: AC
  Administered 2016-11-26: 10 mg via INTRAVENOUS

## 2016-11-26 MED ORDER — DEXTROSE 5 % IV SOLN
INTRAVENOUS | Status: AC
  Filled 2016-11-26: qty 1.5

## 2016-11-26 MED ORDER — FENTANYL CITRATE (PF) 250 MCG/5ML IJ SOLN
INTRAMUSCULAR | Status: AC
  Filled 2016-11-26: qty 5

## 2016-11-26 MED ORDER — LIDOCAINE HCL (PF) 1 % IJ SOLN
INTRAMUSCULAR | Status: DC | PRN
  Administered 2016-11-26: 30 mL

## 2016-11-26 MED ORDER — OXYCODONE-ACETAMINOPHEN 5-325 MG PO TABS: 1.0000 | ORAL_TABLET | Freq: Four times a day (QID) | ORAL | 0 refills | Status: DC | PRN

## 2016-11-26 MED ORDER — HEMOSTATIC AGENTS (NO CHARGE) OPTIME
TOPICAL | Status: DC | PRN
  Administered 2016-11-26: 1 via TOPICAL

## 2016-11-26 MED ORDER — SODIUM CHLORIDE 0.9 % IV SOLN
INTRAVENOUS | Status: DC
  Administered 2016-11-26: 07:00:00 via INTRAVENOUS

## 2016-11-26 MED ORDER — DEXTROSE 5 % IV SOLN
1.5000 g | INTRAVENOUS | Status: AC
  Administered 2016-11-26: 1.5 g via INTRAVENOUS

## 2016-11-26 SURGICAL SUPPLY — 34 items
CANISTER SUCT 3000ML PPV (MISCELLANEOUS) ×4 IMPLANT
COVER PROBE W GEL 5X96 (DRAPES) IMPLANT
DERMABOND ADVANCED (GAUZE/BANDAGES/DRESSINGS) ×2
DERMABOND ADVANCED .7 DNX12 (GAUZE/BANDAGES/DRESSINGS) ×2 IMPLANT
DRSG TEGADERM 4X4.75 (GAUZE/BANDAGES/DRESSINGS) ×4 IMPLANT
ELECT REM PT RETURN 9FT ADLT (ELECTROSURGICAL) ×4
ELECTRODE REM PT RTRN 9FT ADLT (ELECTROSURGICAL) ×2 IMPLANT
GAUZE SPONGE 4X4 12PLY STRL (GAUZE/BANDAGES/DRESSINGS) ×4 IMPLANT
GLOVE BIO SURGEON STRL SZ7 (GLOVE) ×4 IMPLANT
GLOVE BIOGEL PI IND STRL 6.5 (GLOVE) ×2 IMPLANT
GLOVE BIOGEL PI IND STRL 7.5 (GLOVE) ×2 IMPLANT
GLOVE BIOGEL PI INDICATOR 6.5 (GLOVE) ×2
GLOVE BIOGEL PI INDICATOR 7.5 (GLOVE) ×2
GLOVE SURG SS PI 7.0 STRL IVOR (GLOVE) ×4 IMPLANT
GOWN STRL REUS W/ TWL LRG LVL3 (GOWN DISPOSABLE) ×4 IMPLANT
GOWN STRL REUS W/ TWL XL LVL3 (GOWN DISPOSABLE) ×4 IMPLANT
GOWN STRL REUS W/TWL LRG LVL3 (GOWN DISPOSABLE) ×4
GOWN STRL REUS W/TWL XL LVL3 (GOWN DISPOSABLE) ×4
HEMOSTAT SPONGE AVITENE ULTRA (HEMOSTASIS) IMPLANT
KIT BASIN OR (CUSTOM PROCEDURE TRAY) ×4 IMPLANT
KIT ROOM TURNOVER OR (KITS) ×4 IMPLANT
NEEDLE HYPO 25GX1X1/2 BEV (NEEDLE) ×4 IMPLANT
NS IRRIG 1000ML POUR BTL (IV SOLUTION) ×4 IMPLANT
PACK CV ACCESS (CUSTOM PROCEDURE TRAY) ×4 IMPLANT
PAD ARMBOARD 7.5X6 YLW CONV (MISCELLANEOUS) ×8 IMPLANT
SUT ETHILON 3 0 PS 1 (SUTURE) IMPLANT
SUT MNCRL AB 4-0 PS2 18 (SUTURE) ×4 IMPLANT
SUT PROLENE 5 0 C 1 24 (SUTURE) ×4 IMPLANT
SUT PROLENE 6 0 BV (SUTURE) ×4 IMPLANT
SUT SILK 0 TIES 10X30 (SUTURE) ×4 IMPLANT
SUT VIC AB 3-0 SH 27 (SUTURE) ×2
SUT VIC AB 3-0 SH 27X BRD (SUTURE) ×2 IMPLANT
UNDERPAD 30X30 (UNDERPADS AND DIAPERS) ×4 IMPLANT
WATER STERILE IRR 1000ML POUR (IV SOLUTION) ×4 IMPLANT

## 2016-11-26 NOTE — Interval H&P Note (Signed)
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  T82.510 END STAGE RENAL DISEASE  N18.6  The various methods of treatment have been discussed with the patient and family. After consideration of risks, benefits and other options for treatment, the patient has consented to  Procedure(s): LIGATION OF RIGHT UPPER ARM ARTERIOVENOUS GORTEX GRAFT (Right) POSSIBLE REMOVAL OF RIGHT FEMORAL TUNNEL DIALYSIS CATHETER (Right) as a surgical intervention .  The patient's history has been reviewed, patient examined, no change in status, stable for surgery.  I have reviewed the patient's chart and labs.  Questions were answered to the patient's satisfaction.     [name]

## 2016-11-26 NOTE — Anesthesia Postprocedure Evaluation (Signed)
Anesthesia Post Note  Patient: DEJAN ANGERT  Procedure(s) Performed: Procedure(s) (LRB): LIGATION OF RIGHT UPPER ARM ARTERIOVENOUS GORTEX GRAFT (Right) REMOVAL OF RIGHT FEMORAL TUNNEL DIALYSIS CATHETER (Right)     Patient location during evaluation: PACU Anesthesia Type: MAC Level of consciousness: awake and alert Pain management: pain level controlled Vital Signs Assessment: post-procedure vital signs reviewed and stable Respiratory status: spontaneous breathing, nonlabored ventilation, respiratory function stable and patient connected to nasal cannula oxygen Cardiovascular status: stable and blood pressure returned to baseline Postop Assessment: no apparent nausea or vomiting Anesthetic complications: no    Last Vitals:  Vitals:   11/26/16 0931 11/26/16 0940  BP: (!) 146/57 (!) 155/55  Pulse:  62  Resp: (!) 23 (!) 32  Temp:  36.5 C  SpO2:  100%    Last Pain:  Vitals:   11/26/16 0940  TempSrc:   PainSc: Asleep                 Noela Brothers S

## 2016-11-26 NOTE — H&P (View-Only) (Signed)
Postoperative Access Visit   History of Present Illness   Charles Vasquez is a 81 y.o. year old male who presents for postoperative follow-up for: left thigh arteriovenous graft (Date: 10/22/16).  The patient's wounds are healed.  The patient notes no ischemic sx in L foot.  The patient has had multiple toes amputated on that foot already.  The patient is able to complete their activities of daily living.  The patient's current symptoms are: right hand pain.  He notes pain in R hand with ischemic changes in R 2nd finger.  He also notes weakness and para/anesthesia in the R hand for ?1 month.  Past Medical History:  Diagnosis Date  . Diabetes mellitus without complication (HCC)    lantus daily and humalog sliding scale, type2  . Dizziness   . ESRD (end stage renal disease) on dialysis (HCC)    "[facility]; MWF" (10/22/2016)  . History of blood transfusion   . Hypertension    takes Amlodipine every other day  . Myocardial infarction (HCC)   . Pneumonia    last yr  . Stroke [facility])     Past Surgical History:  Procedure Laterality Date  . ARTERIOVENOUS GRAFT PLACEMENT    . ARTERIOVENOUS GRAFT PLACEMENT Left 10/22/2016   thigh  . AV FISTULA PLACEMENT  03/31/2012   Procedure: ARTERIOVENOUS (AV) FISTULA CREATION;  Surgeon: [name], MD;  Location: [facility] OR;  Service: Vascular;  Laterality: Right;  First stage Brachial vein transposition  . AV FISTULA PLACEMENT Right 08/25/2012   Procedure: INSERTION OF ARTERIOVENOUS (AV) GORE-TEX GRAFT ARM;  Surgeon: [name], MD;  Location: [facility] OR;  Service: Vascular;  Laterality: Right;  . AV FISTULA PLACEMENT Left 07/23/2016   Procedure: INSERTION OF ARTERIOVENOUS (AV) GORE-TEX GRAFT LEFT UPPER ARM;  Surgeon: [name], MD;  Location: [facility] OR;  Service: Vascular;  Laterality: Left;  . AV FISTULA PLACEMENT Left 10/22/2016   Procedure: INSERTION OF 4-16mm x 45cm  ARTERIOVENOUS (AV) GORE-TEX GRAFT THIGH-LEFT;  Surgeon: [name], MD;  Location: [facility] OR;   Service: Vascular;  Laterality: Left;  . EYE SURGERY Bilateral    cataracts  . INSERTION OF DIALYSIS CATHETER Left 05/05/2012   Procedure: INSERTION OF DIALYSIS CATHETER;  Surgeon: Chuck Hint, MD;  Location: [facility] OR;  Service: Vascular;  Laterality: Left;  . INSERTION OF DIALYSIS CATHETER Right 10/01/2016   Procedure: INSERTION OF DIALYSIS CATHETER- RIGHT FEMORAL;  Surgeon: [name], MD;  Location: [facility] OR;  Service: Vascular;  Laterality: Right;  . IR FLUORO GUIDE CV LINE RIGHT  06/10/2016  . IR GENERIC HISTORICAL  06/07/2016   IR US GUIDE VASC ACCESS RIGHT 06/07/2016 Oley Balm, MD [facility]-INTERV RAD  . IR THROMBECTOMY AV FISTULA W/THROMBOLYSIS/PTA INC/SHUNT/IMG RIGHT Right 06/07/2016  . IR US GUIDE VASC ACCESS RIGHT  06/10/2016  . LIGATION ARTERIOVENOUS GORTEX GRAFT Left 08/04/2016   Procedure: LIGATION ARTERIOVENOUS GORTEX GRAFT;  Surgeon: Larina Earthly, MD;  Location: [facility] OR;  Service: Vascular;  Laterality: Left;  . LIGATION OF ARTERIOVENOUS  FISTULA Right 08/25/2012   Procedure: LIGATION OF ARTERIOVENOUS  FISTULA;  Surgeon: [name], MD;  Location: [facility] OR;  Service: Vascular;  Laterality: Right;  Ligation of right brachial vein transposition  . REMOVAL OF A DIALYSIS CATHETER Right 05/05/2012   Procedure: REMOVAL OF A DIALYSIS CATHETER;  Surgeon: Chuck Hint, MD;  Location: [facility] OR;  Service: Vascular;  Laterality: Right;  . REMOVAL OF A DIALYSIS CATHETER Right 10/01/2016  Procedure: REMOVAL OF A DIALYSIS CATHETER-RIGHT UPPER CHEST;  Surgeon: [name], MD;  Location: [facility] OR;  Service: Vascular;  Laterality: Right;  . TOE AMPUTATION    . UPPER EXTREMITY VENOGRAPHY Bilateral 07/18/2016   Procedure: Bilateral Upper Extremity Venography;  Surgeon: [name], MD;  Location: [facility] INVASIVE CV LAB;  Service: Cardiovascular;  Laterality: Bilateral;    Social History   Social History  . Marital status: Widowed    Spouse name: N/A  . Number of children: N/A  . Years of  education: N/A   Occupational History  . Not on file.   Social History Main Topics  . Smoking status: Never Smoker  . Smokeless tobacco: Never Used  . Alcohol use No  . Drug use: No  . Sexual activity: Yes   Other Topics Concern  . Not on file   Social History Narrative  . No narrative on file     Family History  Problem Relation Age of Onset  . Diabetes Mother   . Hypertension Mother   . Diabetes Father   . Hypertension Father     Current Outpatient Prescriptions  Medication Sig Dispense Refill  . aspirin EC 325 MG tablet Take 325-650 mg by mouth every 8 (eight) hours as needed (for pain.).    Marland Kitchen calcium carbonate (TUMS EX) 750 MG chewable tablet Chew 1 tablet by mouth 2 (two) times daily as needed for heartburn.    . clindamycin (CLEOCIN) 300 MG capsule Take 300 mg by mouth 3 (three) times daily.    . clopidogrel (PLAVIX) 75 MG tablet Take 1 tablet by mouth daily.    . Darbepoetin Alfa (ARANESP) 60 MCG/0.3ML SOSY injection Inject 0.3 mLs (60 mcg total) into the vein every Friday with hemodialysis. 4.2 mL   . insulin glargine (LANTUS) 100 UNIT/ML injection Inject 5 Units into the skin at bedtime. Per BS    . latanoprost (XALATAN) 0.005 % ophthalmic solution Place 1 drop into both eyes at bedtime.    Marland Kitchen losartan (COZAAR) 100 MG tablet Take 100 mg by mouth daily.    . multivitamin (RENA-VIT) TABS tablet Take 1 tablet by mouth daily.    . sevelamer carbonate (RENVELA) 0.8 g PACK packet Take 1.6 g by mouth 3 (three) times daily with meals. 270 each 2  . valACYclovir (VALTREX) 1000 MG tablet Take 1,000 mg by mouth daily.     Marland Kitchen oxyCODONE-acetaminophen (PERCOCET/ROXICET) 5-325 MG tablet Take 1-2 tablets by mouth every 6 (six) hours as needed for moderate pain. 20 tablet 0   No current facility-administered medications for this visit.      Allergies  Allergen Reactions  . Lisinopril Other (See Comments)    REACTION: hyperkalemia/renal deterioration    REVIEW OF SYSTEMS  (negative unless checked):   Cardiac:   Chest pain or chest pressure?  Shortness of breath upon activity?  Shortness of breath when lying flat?  Irregular heart rhythm?  Vascular:   Pain in calf, thigh, or hip brought on by walking?  Pain in feet at night that wakes you up from your sleep?  Blood clot in your veins?  Leg swelling?  Pulmonary:   Oxygen at home?  Productive cough?  Wheezing?  Neurologic:   Sudden weakness in arms or legs?  Sudden numbness in arms or legs?  Sudden onset of difficult speaking or slurred speech?  Temporary loss of vision in one eye?  Problems with dizziness?  Gastrointestinal:   Blood in stool?  Vomited blood?  Genitourinary:   Burning when urinating?  Blood in urine?  end stage renal disease req HD: M-W-F  Psychiatric:   Major depression  Hematologic:   Bleeding problems?  Problems with blood clotting?  Dermatologic:   Rashes or ulcers?  Constitutional:   Fever or chills?  Ear/Nose/Throat:   Change in hearing?  Nose bleeds?  Sore throat?  Musculoskeletal:   Back pain?  Joint pain?  Muscle pain?   Physical Examination   Vitals:   11/22/16 0959  BP: (!) 147/71  Pulse: 83  Temp: (!) 97.5 F (36.4 C)  TempSrc: Oral  SpO2: 99%  Weight: 135 lb 4.8 oz (61.4 kg)   Body mass index is 21.19 kg/m.  Pulmonary Sym exp, good air movt, CTAB, no rales, rhonchi, & wheezing  Cardiac RRR, Nl S1, S2, no Murmurs, rubs or gallops   Left thigh Incision healed, palpable thrill, +bruit, L foot unchanged without new ulcer or gangrene, no palpable pulses, viable L foot  Right arm +thrill in RUA AVG, no palpable radial or ulnar pulses, R hand grip 3-4/5, R 2nd distal phalange dry gangrene    Medical Decision Making   JARONE OSTERGAARD is a 81 y.o. year old male who presents s/p left thigh arteriovenous graft   The patient's access is ready for use.  The patient's  tunneled dialysis catheter can be removed after two successful cannulations and completed dialysis treatments.  Thank you for allowing [place] to participate in this patient's care.   [name], MD, FACS [facility] [place] Office: [phone] Pager: [phone]

## 2016-11-26 NOTE — Telephone Encounter (Signed)
-----   Message from Sharee Pimple, RN sent at 11/26/2016  9:25 AM EDT ----- Regarding: 4-6 weeks and make sure Hand surgery referral is being worked on per [employer]    ----- Message ----- From: [name], MD Sent: 11/26/2016   8:52 AM To: [facility] Charge [address]  Charles Vasquez [id number] 1935/03/24  PROCEDURE: Ligation of right upper arm arteriovenous graft  Removal right femoral tunneled dialysis catheter   Follow-up: 4-6 weeks

## 2016-11-26 NOTE — Telephone Encounter (Signed)
Sched appt 12/17/16 at 10:30. Lm on cell#.

## 2016-11-26 NOTE — Op Note (Signed)
    OPERATIVE NOTE    PROCEDURE: 1. Ligation of right upper arm arteriovenous graft  2. Removal right femoral tunneled dialysis catheter   PRE-OPERATIVE DIAGNOSIS: right hand gangrene, active right upper arm arteriovenous graft   POST-OPERATIVE DIAGNOSIS: same as above   SURGEON: Adele Barthel, MD  ANESTHESIA: local and MAC  ESTIMATED BLOOD LOSS: 30 cc  FINDING(S): 1.  No further thrill in right upper arm arteriovenous graft at end of case  SPECIMEN(S):  none  INDICATIONS:   Charles Vasquez is a 81 y.o. male who presents with new right hand gangrene and active unused right hand arteriovenous graft. The patient is aware the risks include but are not limited to: bleeding, infection, nerve damage,need for additional procedures, death and stroke.    DESCRIPTION: After obtaining full informed written consent, the patient was brought back to the operating room and placed supine upon the operating table.  The patient received IV antibiotics prior to induction.  A procedure time out was completed and the correct surgical site was verified.  After obtaining adequate anesthesia, the patient was prepped and draped in the standard fashion for: right arm access.  I injected 7 cc of 1% lidocaine without epinephrine over the distal anastomosis of the right upper arm arteriovenous graft.  I made an incision overlying the arterial anastomosis and dissected down to the graft with electrocautery.  I clamped the graft just distal to the anastomosis and clamped the graft 2 cm proximal to the distal clamp.  I transected the graft, leaving a cuff of graft on the artery as a patch.  I oversewed both ends of the right upper arm arteriovenous graft with a double layer of 5-0 Prolene.  I released the clamps and packed the wound with Avitene.  After waiting a few minutes, no further bleeding was present.  There was NO palpable thrill in the right upper arm arteriovenous graft.  I reapproximated the subcutaneous tissue  with a double layer of 3-0 Vicryl.  The skin was reapproximated with a running subcuticular stitch of 4-0 Monocryl.  The skin was cleaned, dried, and reinforced with Dermabond.  At this point, I took down the drapes overlying the right femoral tunneled dialysis catheter.  I removed the bandage overlying the tunneled dialysis catheter and sprayed Betadine over the exit site of the tunneled dialysis catheter.  I injected another 3 cc of 1% lidocaine without epinephrine around the cuff of the tunneled dialysis catheter.  I transected the securing sutures and then easily dissected out the cuff.  I pulled out the tunneled dialysis catheter and held pressure to the femoral vein for 5 minutes.  There was no further bleeding. A sterile bandage was placed over the exit site.   COMPLICATIONS: none  CONDITION: stable   Adele Barthel, MD, [facility] [place] Office: [phone] Pager: [phone]  11/26/2016, 8:37 AM

## 2016-11-26 NOTE — Transfer of Care (Signed)
Immediate Anesthesia Transfer of Care Note  Patient: Charles Vasquez  Procedure(s) Performed: Procedure(s): LIGATION OF RIGHT UPPER ARM ARTERIOVENOUS GORTEX GRAFT (Right) REMOVAL OF RIGHT FEMORAL TUNNEL DIALYSIS CATHETER (Right)  Patient Location: PACU  Anesthesia Type:MAC  Level of Consciousness: awake  Airway & Oxygen Therapy: Patient Spontanous Breathing and Patient connected to face mask oxygen  Post-op Assessment: Report given to RN and Post -op Vital signs reviewed and stable  Post vital signs: Reviewed and stable  Last Vitals:  Vitals:   11/26/16 0649  BP: (!) 145/43  Pulse: 64  Resp: 16  Temp: 36.9 C  SpO2: 100%    Last Pain:  Vitals:   11/26/16 0649  TempSrc: Oral  PainSc:       Patients Stated Pain Goal: 3 (00/45/99 7741)  Complications: No apparent anesthesia complications

## 2016-11-26 NOTE — Anesthesia Preprocedure Evaluation (Signed)
Anesthesia Evaluation  Patient identified by MRN, date of birth, ID band Patient awake    Reviewed: Allergy & Precautions, H&P , NPO status , Patient's Chart, lab work & pertinent test results  Airway Mallampati: II   Neck ROM: full    Dental   Pulmonary neg pulmonary ROS,    breath sounds clear to auscultation       Cardiovascular hypertension, + Past MI   Rhythm:regular Rate:Normal     Neuro/Psych CVA    GI/Hepatic GERD  ,  Endo/Other  diabetes, Type 2  Renal/GU ESRF and DialysisRenal disease     Musculoskeletal  (+) Arthritis ,   Abdominal   Peds  Hematology   Anesthesia Other Findings   Reproductive/Obstetrics                             Anesthesia Physical Anesthesia Plan  ASA: IV  Anesthesia Plan: MAC   Post-op Pain Management:    Induction: Intravenous  PONV Risk Score and Plan: 1 and Ondansetron, Dexamethasone and Treatment may vary due to age or medical condition  Airway Management Planned: Simple Face Mask  Additional Equipment:   Intra-op Plan:   Post-operative Plan:   Informed Consent: I have reviewed the patients History and Physical, chart, labs and discussed the procedure including the risks, benefits and alternatives for the proposed anesthesia with the patient or authorized representative who has indicated his/her understanding and acceptance.     Plan Discussed with: CRNA, Anesthesiologist and Surgeon  Anesthesia Plan Comments:         Anesthesia Quick Evaluation

## 2016-11-27 ENCOUNTER — Encounter (HOSPITAL_COMMUNITY): Payer: Self-pay | Admitting: Vascular Surgery

## 2016-11-29 ENCOUNTER — Encounter: Payer: Medicare Other | Admitting: Vascular Surgery

## 2016-12-04 ENCOUNTER — Ambulatory Visit (INDEPENDENT_AMBULATORY_CARE_PROVIDER_SITE_OTHER): Payer: Medicare Other | Admitting: Orthopaedic Surgery

## 2016-12-04 DIAGNOSIS — M79641 Pain in right hand: Secondary | ICD-10-CM

## 2016-12-04 NOTE — Progress Notes (Signed)
Office Visit Note   Patient: Charles Vasquez           Date of Birth: [birth year]           MRN: [id number] Visit Date: 12/04/2016              Requested by: Toma Deiters, MD [address] [place], [place] [address] PCP: Toma Deiters, MD   Assessment & Plan: Visit Diagnoses:  1. Pain of right hand   my concern is a vascular insult to the right index and long fingers status post removal of a dialysis shunt1 month.  Plan: I would like one of the hand surgeons to evaluate Charles Vasquez  Follow-Up Instructions: Return if symptoms worsen or fail to improve.   Orders:  No orders of the defined types were placed in this encounter.  No orders of the defined types were placed in this encounter.     Procedures: No procedures performed   Clinical Data: No additional findings.   Subjective: Chief Complaint  Patient presents with  . Right Hand - Finger Injury  [name] is 81 years old and here for evaluation of problems having with his right do hand. He does have a history of end-stage renal disease and is on renal dialysis.approximately one month ago he had removal of the shunt from his right upper extremity. He notes that since that time he's had some difficulty with his right hand and specifically with the index and long fingers. There was a concern that he might have an infection. The fingers of also "darkened". He's having difficulty with range of motion of the fingers of his  right hand associated with swelling. also experiences limited sensibility to the fingers of his right hand and specifically the 2 aforementioned digits. O OBVIOUS INFECTION OR ABSCESS HPI  Review of Systems   Objective: Vital Signs: There were no vitals taken for this visit.  Physical Exam  Ortho Examright hand with moderate swelling of the index and long fingers.Both of those digits are darker compared to the others. Altered sensibility throughout the entireindex and long fingers and specifically  the tips.. Fingers were minimally cool compared to the other fingers. Blackened area beneath the index fingernail.ould not feel pulses at the wrist. Diffuse stiffness across the PIP and MP joints of the index and long and to a lesser extent ring and little  Specialty Comments:  No specialty comments available.  Imaging: No results found.   PMFS History: Patient Active Problem List   Diagnosis Date Noted  . ESRD (end stage renal disease) on dialysis (HCC) 10/22/2016  . Venous hypertension status post AVG procedure 08/10/2016  . Anasarca 08/04/2016  . Facial swelling 08/04/2016  . Effusion, other site 08/04/2016  . 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 Acute respiratory failure (HCC) 08/04/2016  . Hyperkalemia 08/04/2016  . Aftercare following surgery of the circulatory system, NEC-Right  AVGG 09/25/2012  . Other complications due to renal dialysis device, implant, and graft 05/01/2012  . End stage renal disease (HCC) 03/17/2012  . FATIGUE, ACUTE 08/22/2008  . HEMATURIA UNSPECIFIED 07/11/2008  . Diabetes (HCC) 01/19/2008  . Essential hypertension 01/19/2008  . MYOCARDIAL INFARCTION, HX OF 01/19/2008  . GERD 01/19/2008  . OSTEOARTHRITIS 01/19/2008  . CEREBROVASCULAR ACCIDENT, HX OF 01/19/2008   Past Medical History:  Diagnosis Date  . Diabetes mellitus without complication (HCC)    lantus daily, type2  . Dizziness   . ESRD (end stage renal disease) on dialysis (HCC)    "[facility];  MWF" (10/22/2016)  . History of blood transfusion   . Hypertension   . Myocardial infarction (HCC)   . Pneumonia    last yr  . Stroke [facility])     Family History  Problem Relation Age of Onset  . Diabetes Mother   . Hypertension Mother   . Diabetes Father   . Hypertension Father     Past Surgical History:  Procedure Laterality Date  . ARTERIOVENOUS GRAFT PLACEMENT    . ARTERIOVENOUS GRAFT PLACEMENT Left 10/22/2016   thigh  . AV FISTULA PLACEMENT  03/31/2012   Procedure: ARTERIOVENOUS (AV) FISTULA  CREATION;  Surgeon: [name], MD;  Location: [facility] OR;  Service: Vascular;  Laterality: Right;  First stage Brachial vein transposition  . AV FISTULA PLACEMENT Right 08/25/2012   Procedure: INSERTION OF ARTERIOVENOUS (AV) GORE-TEX GRAFT ARM;  Surgeon: [name], MD;  Location: [facility] OR;  Service: Vascular;  Laterality: Right;  . AV FISTULA PLACEMENT Left 07/23/2016   Procedure: INSERTION OF ARTERIOVENOUS (AV) GORE-TEX GRAFT LEFT UPPER ARM;  Surgeon: [name], MD;  Location: [facility] OR;  Service: Vascular;  Laterality: Left;  . AV FISTULA PLACEMENT Left 10/22/2016   Procedure: INSERTION OF 4-48mm x 45cm  ARTERIOVENOUS (AV) GORE-TEX GRAFT THIGH-LEFT;  Surgeon: [name], MD;  Location: [facility] OR;  Service: Vascular;  Laterality: Left;  . EYE SURGERY Bilateral    cataracts  . INSERTION OF DIALYSIS CATHETER Left 05/05/2012   Procedure: INSERTION OF DIALYSIS CATHETER;  Surgeon: Chuck Hint, MD;  Location: [facility] OR;  Service: Vascular;  Laterality: Left;  . INSERTION OF DIALYSIS CATHETER Right 10/01/2016   Procedure: INSERTION OF DIALYSIS CATHETER- RIGHT FEMORAL;  Surgeon: [name], MD;  Location: [facility] OR;  Service: Vascular;  Laterality: Right;  . IR FLUORO GUIDE CV LINE RIGHT  06/10/2016  . IR GENERIC HISTORICAL  06/07/2016   IR US GUIDE VASC ACCESS RIGHT 06/07/2016 Oley Balm, MD [facility]-INTERV RAD  . IR THROMBECTOMY AV FISTULA W/THROMBOLYSIS/PTA INC/SHUNT/IMG RIGHT Right 06/07/2016  . IR US GUIDE VASC ACCESS RIGHT  06/10/2016  . LIGATION ARTERIOVENOUS GORTEX GRAFT Left 08/04/2016   Procedure: LIGATION ARTERIOVENOUS GORTEX GRAFT;  Surgeon: Larina Earthly, MD;  Location: [facility] OR;  Service: Vascular;  Laterality: Left;  . LIGATION ARTERIOVENOUS GORTEX GRAFT Right 11/26/2016   Procedure: LIGATION OF RIGHT UPPER ARM ARTERIOVENOUS GORTEX GRAFT;  Surgeon: [name], MD;  Location: [facility] OR;  Service: Vascular;  Laterality: Right;  . LIGATION OF ARTERIOVENOUS  FISTULA Right 08/25/2012   Procedure: LIGATION OF  ARTERIOVENOUS  FISTULA;  Surgeon: [name], MD;  Location: [facility] OR;  Service: Vascular;  Laterality: Right;  Ligation of right brachial vein transposition  . REMOVAL OF A DIALYSIS CATHETER Right 05/05/2012   Procedure: REMOVAL OF A DIALYSIS CATHETER;  Surgeon: Chuck Hint, MD;  Location: [facility] OR;  Service: Vascular;  Laterality: Right;  . REMOVAL OF A DIALYSIS CATHETER Right 10/01/2016   Procedure: REMOVAL OF A DIALYSIS CATHETER-RIGHT UPPER CHEST;  Surgeon: [name], MD;  Location: [facility] OR;  Service: Vascular;  Laterality: Right;  . REMOVAL OF A DIALYSIS CATHETER Right 11/26/2016   Procedure: REMOVAL OF RIGHT FEMORAL TUNNEL DIALYSIS CATHETER;  Surgeon: [name], MD;  Location: [facility] OR;  Service: Vascular;  Laterality: Right;  . TOE AMPUTATION    . UPPER EXTREMITY VENOGRAPHY Bilateral 07/18/2016   Procedure: Bilateral Upper Extremity Venography;  Surgeon: [name], MD;  Location: [facility] INVASIVE CV LAB;  Service: Cardiovascular;  Laterality: Bilateral;   Social History   Occupational History  . Not on file.   Social History Main Topics  . Smoking status: Never Smoker  . Smokeless tobacco: Never Used  . Alcohol use No  . Drug use: No  . Sexual activity: Yes     Valeria Batman, MD   Note - This record has been created using [employer].  Chart creation errors have been sought, but may not always  have been located. Such creation errors do not reflect on  the standard of medical care.

## 2016-12-10 ENCOUNTER — Encounter (INDEPENDENT_AMBULATORY_CARE_PROVIDER_SITE_OTHER): Payer: Medicare Other | Admitting: Ophthalmology

## 2016-12-10 DIAGNOSIS — H43813 Vitreous degeneration, bilateral: Secondary | ICD-10-CM | POA: Diagnosis not present

## 2016-12-10 DIAGNOSIS — E113511 Type 2 diabetes mellitus with proliferative diabetic retinopathy with macular edema, right eye: Secondary | ICD-10-CM | POA: Diagnosis not present

## 2016-12-10 DIAGNOSIS — H3412 Central retinal artery occlusion, left eye: Secondary | ICD-10-CM | POA: Diagnosis not present

## 2016-12-10 DIAGNOSIS — E113592 Type 2 diabetes mellitus with proliferative diabetic retinopathy without macular edema, left eye: Secondary | ICD-10-CM

## 2016-12-10 DIAGNOSIS — I10 Essential (primary) hypertension: Secondary | ICD-10-CM | POA: Diagnosis not present

## 2016-12-10 DIAGNOSIS — H35033 Hypertensive retinopathy, bilateral: Secondary | ICD-10-CM | POA: Diagnosis not present

## 2016-12-10 DIAGNOSIS — E11311 Type 2 diabetes mellitus with unspecified diabetic retinopathy with macular edema: Secondary | ICD-10-CM | POA: Diagnosis not present

## 2016-12-17 ENCOUNTER — Other Ambulatory Visit: Payer: Self-pay | Admitting: Orthopedic Surgery

## 2016-12-23 ENCOUNTER — Encounter (HOSPITAL_COMMUNITY): Payer: Self-pay | Admitting: *Deleted

## 2016-12-23 ENCOUNTER — Emergency Department (HOSPITAL_COMMUNITY): Payer: Medicare Other

## 2016-12-23 ENCOUNTER — Emergency Department (HOSPITAL_COMMUNITY)
Admission: EM | Admit: 2016-12-23 | Discharge: 2016-12-24 | Disposition: A | Discharge status: Home / Self Care | Payer: Medicare Other | Attending: Emergency Medicine | Admitting: Emergency Medicine

## 2016-12-23 DIAGNOSIS — Z7902 Long term (current) use of antithrombotics/antiplatelets: Secondary | ICD-10-CM | POA: Diagnosis not present

## 2016-12-23 DIAGNOSIS — Z7982 Long term (current) use of aspirin: Secondary | ICD-10-CM | POA: Insufficient documentation

## 2016-12-23 DIAGNOSIS — R109 Unspecified abdominal pain: Secondary | ICD-10-CM | POA: Diagnosis not present

## 2016-12-23 DIAGNOSIS — R197 Diarrhea, unspecified: Secondary | ICD-10-CM | POA: Insufficient documentation

## 2016-12-23 DIAGNOSIS — N186 End stage renal disease: Secondary | ICD-10-CM | POA: Insufficient documentation

## 2016-12-23 DIAGNOSIS — Z79899 Other long term (current) drug therapy: Secondary | ICD-10-CM | POA: Diagnosis not present

## 2016-12-23 DIAGNOSIS — I120 Hypertensive chronic kidney disease with stage 5 chronic kidney disease or end stage renal disease: Secondary | ICD-10-CM | POA: Diagnosis not present

## 2016-12-23 DIAGNOSIS — E1122 Type 2 diabetes mellitus with diabetic chronic kidney disease: Secondary | ICD-10-CM | POA: Insufficient documentation

## 2016-12-23 DIAGNOSIS — Z794 Long term (current) use of insulin: Secondary | ICD-10-CM | POA: Diagnosis not present

## 2016-12-23 DIAGNOSIS — Z992 Dependence on renal dialysis: Secondary | ICD-10-CM | POA: Diagnosis not present

## 2016-12-23 LAB — COMPREHENSIVE METABOLIC PANEL
ALK PHOS: 111 U/L (ref 38–126)
ALT: 38 U/L (ref 17–63)
ANION GAP: 11 (ref 5–15)
AST: 43 U/L — ABNORMAL HIGH (ref 15–41)
Albumin: 3.6 g/dL (ref 3.5–5.0)
BUN: 24 mg/dL — ABNORMAL HIGH (ref 6–20)
CALCIUM: 8.2 mg/dL — AB (ref 8.9–10.3)
CO2: 30 mmol/L (ref 22–32)
Chloride: 94 mmol/L — ABNORMAL LOW (ref 101–111)
Creatinine, Ser: 5.49 mg/dL — ABNORMAL HIGH (ref 0.61–1.24)
GFR, EST AFRICAN AMERICAN: 10 mL/min — AB (ref >60)
GFR, EST NON AFRICAN AMERICAN: 9 mL/min — AB (ref >60)
Glucose, Bld: 161 mg/dL — ABNORMAL HIGH (ref 65–99)
Potassium: 4.2 mmol/L (ref 3.5–5.1)
SODIUM: 135 mmol/L (ref 135–145)
TOTAL PROTEIN: 7.7 g/dL (ref 6.5–8.1)
Total Bilirubin: 0.7 mg/dL (ref 0.3–1.2)

## 2016-12-23 LAB — CBC
HCT: 36 % — ABNORMAL LOW (ref 39.0–52.0)
HEMOGLOBIN: 11.9 g/dL — AB (ref 13.0–17.0)
MCH: 31.9 pg (ref 26.0–34.0)
MCHC: 33.1 g/dL (ref 30.0–36.0)
MCV: 96.5 fL (ref 78.0–100.0)
Platelets: 163 10*3/uL (ref 150–400)
RBC: 3.73 MIL/uL — ABNORMAL LOW (ref 4.22–5.81)
RDW: 17.1 % — ABNORMAL HIGH (ref 11.5–15.5)
WBC: 10.9 10*3/uL — AB (ref 4.0–10.5)

## 2016-12-23 LAB — URINALYSIS, ROUTINE W REFLEX MICROSCOPIC
BILIRUBIN URINE: NEGATIVE
Bacteria, UA: NONE SEEN
HGB URINE DIPSTICK: NEGATIVE
KETONES UR: NEGATIVE mg/dL
LEUKOCYTES UA: NEGATIVE
NITRITE: NEGATIVE
PROTEIN: 100 mg/dL — AB
Specific Gravity, Urine: 1.008 (ref 1.005–1.030)
pH: 7 (ref 5.0–8.0)

## 2016-12-23 LAB — LIPASE, BLOOD: Lipase: 17 U/L (ref 11–51)

## 2016-12-23 LAB — CBG MONITORING, ED: GLUCOSE-CAPILLARY: 200 mg/dL — AB (ref 65–99)

## 2016-12-23 MED ORDER — BARIUM SULFATE 2.1 % PO SUSP
ORAL | Status: AC
  Filled 2016-12-23: qty 2

## 2016-12-23 NOTE — ED Triage Notes (Signed)
Pt reports loose stools onset yesterday, x 3 loose stools today, c/o mid abd pain, denies n/v/d, completed full HD tx today, pt has breaks in skin and swelling to R index and R middle finger pt has an appt to be seen for tomorrow, pt appears lethargic, A&O x4

## 2016-12-23 NOTE — Progress Notes (Deleted)
    Postoperative Access Visit   History of Present Illness   Charles Vasquez is a 81 y.o. year old male who presents for postoperative follow-up for: right upper arm arteriovenous graft ligation for resumed R hand gangrene with contribution from RUA AVG, removal of R femoral TDC (Date: 11/26/16).    The patient's wounds are *** healed.  The patient notes *** steal symptoms.  The patient is *** able to complete their activities of daily living.  The patient's current symptoms are: ***.  Cannulation from L thigh has been ***successful.   Physical Examination  ***There were no vitals filed for this visit. ***There is no height or weight on file to calculate BMI.  {side of body:[id number]} arm Incision is *** healed, skin feels ***, hand grip is ***/5, sensation in digits is *** intact, ***palpable thrill, bruit can *** be auscultated     Medical Decision Making   Charles Vasquez is a 81 y.o. year old male who presents s/p ight upper arm arteriovenous graft ligation for resumed R hand gangrene with contribution from RUA AVG, removal of R femoral TDC, s/p L thigh AVG   ***  Appears pt was referred to Ortho rather than Hand Surgery.  Will correct error and refer to Hand Surgery.  Follow up in 3 months to recheck on R hand..    Thank you for allowing [place] to participate in this patient's care.   [name], MD, FACS [facility] [place] Office: [phone] Pager: [phone]

## 2016-12-23 NOTE — ED Notes (Signed)
Pt returned from CT °

## 2016-12-23 NOTE — ED Provider Notes (Signed)
[facility] EMERGENCY DEPARTMENT Provider Note   CSN: [id number] Arrival date & time: 12/23/16  1308   History   Chief Complaint Chief Complaint  Patient presents with  . Diarrhea    HPI Charles Vasquez is a 81 y.o. male.  HPI Pt started having diarrhea yesterday.  He had a couple of episodes today and then had additional episodes today.  He had one episode while he was on dialysis today and had another one after that.  He has not had any ore episodes while waiting in the ED.  No further episodes in the last 7 hours.  He did vomit once today in the waiting room.  He did have some abdominal pain today.  No fevers.  He has been on abx recently.  No recent travel. Past Medical History:  Diagnosis Date  . Diabetes mellitus without complication (HCC)    lantus daily, type2  . Dizziness   . ESRD (end stage renal disease) on dialysis (HCC)    "Charles Vasquez; MWF" (10/22/2016)  . History of blood transfusion   . Hypertension   . Myocardial infarction (HCC)   . Pneumonia    last yr  . Stroke [facility])     Patient Active Problem List   Diagnosis Date Noted  . ESRD (end stage renal disease) on dialysis (HCC) 10/22/2016  . Venous hypertension status post AVG procedure 08/10/2016  . Anasarca 08/04/2016  . Facial swelling 08/04/2016  . Effusion, other site 08/04/2016  . 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 Acute respiratory failure (HCC) 08/04/2016  . Hyperkalemia 08/04/2016  . Aftercare following surgery of the circulatory system, NEC-Right  AVGG 09/25/2012  . Other complications due to renal dialysis device, implant, and graft 05/01/2012  . End stage renal disease (HCC) 03/17/2012  . FATIGUE, ACUTE 08/22/2008  . HEMATURIA UNSPECIFIED 07/11/2008  . Diabetes (HCC) 01/19/2008  . Essential hypertension 01/19/2008  . MYOCARDIAL INFARCTION, HX OF 01/19/2008  . GERD 01/19/2008  . OSTEOARTHRITIS 01/19/2008  . CEREBROVASCULAR ACCIDENT, HX OF 01/19/2008    Past Surgical History:    Procedure Laterality Date  . ARTERIOVENOUS GRAFT PLACEMENT    . ARTERIOVENOUS GRAFT PLACEMENT Left 10/22/2016   thigh  . AV FISTULA PLACEMENT  03/31/2012   Procedure: ARTERIOVENOUS (AV) FISTULA CREATION;  Surgeon: [name], MD;  Location: [facility] OR;  Service: Vascular;  Laterality: Right;  First stage Brachial vein transposition  . AV FISTULA PLACEMENT Right 08/25/2012   Procedure: INSERTION OF ARTERIOVENOUS (AV) GORE-TEX GRAFT ARM;  Surgeon: [name], MD;  Location: [facility] OR;  Service: Vascular;  Laterality: Right;  . AV FISTULA PLACEMENT Left 07/23/2016   Procedure: INSERTION OF ARTERIOVENOUS (AV) GORE-TEX GRAFT LEFT UPPER ARM;  Surgeon: [name], MD;  Location: [facility] OR;  Service: Vascular;  Laterality: Left;  . AV FISTULA PLACEMENT Left 10/22/2016   Procedure: INSERTION OF 4-32mm x 45cm  ARTERIOVENOUS (AV) GORE-TEX GRAFT THIGH-LEFT;  Surgeon: [name], MD;  Location: [facility] OR;  Service: Vascular;  Laterality: Left;  . EYE SURGERY Bilateral    cataracts  . INSERTION OF DIALYSIS CATHETER Left 05/05/2012   Procedure: INSERTION OF DIALYSIS CATHETER;  Surgeon: Chuck Hint, MD;  Location: [facility] OR;  Service: Vascular;  Laterality: Left;  . INSERTION OF DIALYSIS CATHETER Right 10/01/2016   Procedure: INSERTION OF DIALYSIS CATHETER- RIGHT FEMORAL;  Surgeon: [name], MD;  Location: [facility] OR;  Service: Vascular;  Laterality: Right;  . IR FLUORO GUIDE CV LINE RIGHT  06/10/2016  .  IR GENERIC HISTORICAL  06/07/2016   IR US GUIDE VASC ACCESS RIGHT 06/07/2016 Oley Balm, MD [facility]-INTERV RAD  . IR THROMBECTOMY AV FISTULA W/THROMBOLYSIS/PTA INC/SHUNT/IMG RIGHT Right 06/07/2016  . IR US GUIDE VASC ACCESS RIGHT  06/10/2016  . LIGATION ARTERIOVENOUS GORTEX GRAFT Left 08/04/2016   Procedure: LIGATION ARTERIOVENOUS GORTEX GRAFT;  Surgeon: Larina Earthly, MD;  Location: [facility] OR;  Service: Vascular;  Laterality: Left;  . LIGATION ARTERIOVENOUS GORTEX GRAFT Right 11/26/2016   Procedure: LIGATION OF RIGHT UPPER  ARM ARTERIOVENOUS GORTEX GRAFT;  Surgeon: [name], MD;  Location: [facility] OR;  Service: Vascular;  Laterality: Right;  . LIGATION OF ARTERIOVENOUS  FISTULA Right 08/25/2012   Procedure: LIGATION OF ARTERIOVENOUS  FISTULA;  Surgeon: [name], MD;  Location: [facility] OR;  Service: Vascular;  Laterality: Right;  Ligation of right brachial vein transposition  . REMOVAL OF A DIALYSIS CATHETER Right 05/05/2012   Procedure: REMOVAL OF A DIALYSIS CATHETER;  Surgeon: Chuck Hint, MD;  Location: [facility] OR;  Service: Vascular;  Laterality: Right;  . REMOVAL OF A DIALYSIS CATHETER Right 10/01/2016   Procedure: REMOVAL OF A DIALYSIS CATHETER-RIGHT UPPER CHEST;  Surgeon: [name], MD;  Location: [facility] OR;  Service: Vascular;  Laterality: Right;  . REMOVAL OF A DIALYSIS CATHETER Right 11/26/2016   Procedure: REMOVAL OF RIGHT FEMORAL TUNNEL DIALYSIS CATHETER;  Surgeon: [name], MD;  Location: [facility] OR;  Service: Vascular;  Laterality: Right;  . TOE AMPUTATION    . UPPER EXTREMITY VENOGRAPHY Bilateral 07/18/2016   Procedure: Bilateral Upper Extremity Venography;  Surgeon: [name], MD;  Location: [facility] INVASIVE CV LAB;  Service: Cardiovascular;  Laterality: Bilateral;       Home Medications    Prior to Admission medications   Medication Sig Start Date End Date Taking? Authorizing Provider  aspirin EC 81 MG tablet Take 81 mg by mouth daily.   Yes [provider]  bacitracin ophthalmic ointment Place 1 application into the left eye at bedtime. apply to eye   Yes [provider]  clopidogrel (PLAVIX) 75 MG tablet Take 75 mg by mouth at bedtime.  11/20/16  Yes [provider]  insulin glargine (LANTUS) 100 UNIT/ML injection Inject 5 Units into the skin at bedtime.    Yes [provider]  latanoprost (XALATAN) 0.005 % ophthalmic solution Place 1 drop into the left eye at bedtime. 10/15/16  Yes [provider]  losartan (COZAAR) 100 MG tablet Take 100 mg by mouth  daily.   Yes [provider]  multivitamin (RENA-VIT) TABS tablet Take 1 tablet by mouth daily.   Yes [provider]  oxyCODONE-acetaminophen (ROXICET) 5-325 MG tablet Take 1-2 tablets by mouth every 6 (six) hours as needed for moderate pain. 11/26/16 11/26/17 Yes [name], MD  Polyvinyl Alcohol-Povidone (REFRESH OP) Place 1 drop into both eyes 2 (two) times daily.   Yes [provider]  valACYclovir (VALTREX) 1000 MG tablet Take 1,000 mg by mouth daily.    Yes [provider]  Darbepoetin Alfa (ARANESP) 60 MCG/0.3ML SOSY injection Inject 0.3 mLs (60 mcg total) into the vein every Friday with hemodialysis. Patient not taking: Reported on 12/19/2016 08/16/16   Rama, Maryruth Bun, MD  loperamide (IMODIUM) 2 MG capsule Take 1 capsule (2 mg total) by mouth 4 (four) times daily as needed for diarrhea or loose stools. 12/24/16   Linwood Dibbles, MD  ondansetron (ZOFRAN) 4 MG tablet Take 1 tablet (4 mg total) by mouth  every 6 (six) hours. 12/24/16   Linwood Dibbles, MD    Family History Family History  Problem Relation Age of Onset  . Diabetes Mother   . Hypertension Mother   . Diabetes Father   . Hypertension Father     Social History Social History  Substance Use Topics  . Smoking status: Never Smoker  . Smokeless tobacco: Never Used  . Alcohol use No     Allergies   Lisinopril   Review of Systems Review of Systems  All other systems reviewed and are negative.    Physical Exam Updated Vital Signs BP (!) 183/72   Pulse 77   Temp 97.9 F (36.6 C) (Oral)   Resp 14   SpO2 95%   Physical Exam  Constitutional: He appears well-developed and well-nourished. No distress.  HENT:  Head: Normocephalic and atraumatic.  Right Ear: External ear normal.  Left Ear: External ear normal.  Eyes: Conjunctivae are normal. Right eye exhibits no discharge. Left eye exhibits no discharge. No scleral icterus.  Neck: Neck supple. No tracheal deviation present.    Cardiovascular: Normal rate, regular rhythm and intact distal pulses.   Pulmonary/Chest: Effort normal and breath sounds normal. No stridor. No respiratory distress. He has no wheezes. He has no rales.  Abdominal: Soft. Bowel sounds are normal. He exhibits no distension. There is no tenderness. There is no rebound and no guarding.  Musculoskeletal: He exhibits no edema or tenderness.  Neurological: He is alert. He has normal strength. No cranial nerve deficit (no facial droop, extraocular movements intact, no slurred speech) or sensory deficit. He exhibits normal muscle tone. He displays no seizure activity. Coordination normal.  Skin: Skin is warm and dry. No rash noted.  Psychiatric: He has a normal mood and affect.  Nursing note and vitals reviewed.    ED Treatments / Results  Labs (all labs ordered are listed, but only abnormal results are displayed) Labs Reviewed  COMPREHENSIVE METABOLIC PANEL - Abnormal; Notable for the following:       Result Value   Chloride 94 (*)    Glucose, Bld 161 (*)    BUN 24 (*)    Creatinine, Ser 5.49 (*)    Calcium 8.2 (*)    AST 43 (*)    GFR calc non Af Amer 9 (*)    GFR calc Af Amer 10 (*)    All other components within normal limits  CBC - Abnormal; Notable for the following:    WBC 10.9 (*)    RBC 3.73 (*)    Hemoglobin 11.9 (*)    HCT 36.0 (*)    RDW 17.1 (*)    All other components within normal limits  URINALYSIS, ROUTINE W REFLEX MICROSCOPIC - Abnormal; Notable for the following:    Glucose, UA >=500 (*)    Protein, ur 100 (*)    Squamous Epithelial / LPF 0-5 (*)    All other components within normal limits  CBG MONITORING, ED - Abnormal; Notable for the following:    Glucose-Capillary 200 (*)    All other components within normal limits  LIPASE, BLOOD    EKG  EKG Interpretation None       Radiology Ct Abdomen Pelvis Wo Contrast  Result Date: 12/24/2016 CLINICAL DATA:  Abdominal pain.  Loose stools yesterday and  today. EXAM: CT ABDOMEN AND PELVIS WITHOUT CONTRAST TECHNIQUE: Multidetector CT imaging of the abdomen and pelvis was performed following the standard protocol without IV contrast. COMPARISON:  None. FINDINGS: Lower  chest: Evaluation is limited due to motion artifact. There appears to be a peribronchial vascular infiltrate in the lung bases which may indicate bronchopneumonia. Small esophageal hiatal hernia. Residual contrast material in the lower esophagus without dilatation. This may indicate reflux or dysmotility. Hepatobiliary: No focal liver abnormality is seen. No gallstones, gallbladder wall thickening, or biliary dilatation. Pancreas: Unremarkable. No pancreatic ductal dilatation or surrounding inflammatory changes. Spleen: Normal in size without focal abnormality. Adrenals/Urinary Tract: No adrenal gland nodules. Calcifications in the renal hila are likely vascular calcifications. No discrete stones identified. No hydronephrosis or hydroureter. Bladder is unremarkable. Stomach/Bowel: Contrast material flows through to the rectum without evidence of small bowel or large bowel obstruction. Scattered stool in the rectum. Focal wall thickening with mild shouldering along the splenic flexure of the colon. This may be due to peristalsis but appearance is worrisome for colonic neoplasm. Consider colonoscopy for further evaluation. No inflammatory infiltration identified. No evidence of diverticulitis. Appendix is not identified. Vascular/Lymphatic: Extensive vascular calcifications throughout the celiac axis, mesenteric, and pelvic vessels. Mild calcification of the abdominal aorta. No aneurysms identified. Left femoral vascular grafts are present. There may also be vascular stents in the iliac arteries. Reproductive: Prostate is unremarkable. Other: No abdominal wall hernia or abnormality. No abdominopelvic ascites. Musculoskeletal: No acute or significant osseous findings. IMPRESSION: 1. No evidence of bowel  obstruction or inflammation. 2. Wall thickening with suggestion of shoulder ring at the splenic flexure of the colon. Can't exclude colon neoplasm. Consider colonoscopy for further evaluation. 3. Nodular peribronchial infiltration in the lung bases may indicate bronchopneumonia. 4. Small esophageal hiatal hernia. 5. Aortic atherosclerosis. Extensive vascular calcifications throughout the abdomen and pelvis. Electronically Signed   By: Burman Nieves M.D.   On: 12/24/2016 00:11    Procedures Procedures (including critical care time)  Medications Ordered in ED Medications  Barium Sulfate 2.1 % SUSP (not administered)     Initial Impression / Assessment and Plan / ED Course  I have reviewed the triage vital signs and the nursing notes.  Pertinent labs & imaging results that were available during my care of the patient were reviewed by me and considered in my medical decision making (see chart for details).   Pt presented to the ED for evaluation of abdominal pain, diarrhea and vomiting.  No acute signs of infection or inflammation on CT scan.  Possible wall thickening, recommend colonoscopy follow up.  Will dc home with antiemetics and antidiarrheal agents.  Follow up with PCP  Final Clinical Impressions(s) / ED Diagnoses   Final diagnoses:  Diarrhea of presumed infectious origin    New Prescriptions New Prescriptions   LOPERAMIDE (IMODIUM) 2 MG CAPSULE    Take 1 capsule (2 mg total) by mouth 4 (four) times daily as needed for diarrhea or loose stools.   ONDANSETRON (ZOFRAN) 4 MG TABLET    Take 1 tablet (4 mg total) by mouth every 6 (six) hours.     Linwood Dibbles, MD 12/24/16 [phone]

## 2016-12-23 NOTE — Pre-Procedure Instructions (Signed)
Charles Vasquez  12/23/2016      [facility] [address], [place] - [name] [place] [address] Phone: [phone] Fax: [phone]    Your procedure is scheduled on Thursday, October 18..  Report to [facility] Admitting at 05:30 A.M.  Call this number if you have problems the morning of surgery:  [phone]   Remember:  Do not eat food or drink liquids after midnight.  Continue all other medications as directed by your physician except for following these medication instructions before surgery.   Take these medicines the morning of surgery with A SIP OF WATER: Valacyclovir (Valtrex) Oxycodone-Acetaminophen if needed  Follow your doctor's instructions regarding your PLAVIX.  As of today prior to surgery STOP taking any Aspirin (unless otherwise instructed by your surgeon), Aleve, Naproxen, Ibuprofen, Motrin, Advil, Goody's, BC's, all herbal medications, fish oil, and all vitamins.    How to Manage Your Diabetes Before and After Surgery  Why is it important to control my blood sugar before and after surgery? . Improving blood sugar levels before and after surgery helps healing and can limit problems. . A way of improving blood sugar control is eating a healthy diet by: o  Eating less sugar and carbohydrates o  Increasing activity/exercise o  Talking with your doctor about reaching your blood sugar goals . High blood sugars (greater than 180 mg/dL) can raise your risk of infections and slow your recovery, so you will need to focus on controlling your diabetes during the weeks before surgery. . Make sure that the doctor who takes care of your diabetes knows about your planned surgery including the date and location.  How do I manage my blood sugar before surgery? . Check your blood sugar at least 4 times a day, starting 2 days before surgery, to make sure that the level is not too high or low. o Check your blood sugar the morning of  your surgery when you wake up and every 2 hours until you get to the Short Stay unit. . If your blood sugar is less than 70 mg/dL, you will need to treat for low blood sugar: o Do not take insulin. o Treat a low blood sugar (less than 70 mg/dL) with  cup of clear juice (cranberry or apple), 4 glucose tablets, OR glucose gel. o Recheck blood sugar in 15 minutes after treatment (to make sure it is greater than 70 mg/dL). If your blood sugar is not greater than 70 mg/dL on recheck, call [phone] for further instructions. . Report your blood sugar to the short stay nurse when you get to Short Stay.  . If you are admitted to the hospital after surgery: o Your blood sugar will be checked by the staff and you will probably be given insulin after surgery (instead of oral diabetes medicines) to make sure you have good blood sugar levels. o The goal for blood sugar control after surgery is 80-180 mg/dL.   WHAT DO I DO ABOUT MY DIABETES MEDICATION?   . THE NIGHT BEFORE SURGERY, take 2.5 units of Lantus insulin.       . If your CBG is greater than 220 mg/dL, you may take  of your sliding scale (correction) dose of insulin. .     Do not wear jewelry, make-up or nail polish.  Do not wear lotions, powders, or perfumes, or deoderant.  Do not shave 48 hours prior to surgery.  Men may shave  face and neck.  Do not bring valuables to the hospital.   [facility] is not responsible for any belongings or valuables.  Contacts, dentures or bridgework may not be worn into surgery.  Leave your suitcase in the car.  After surgery it may be brought to your room.  For patients admitted to the hospital, discharge time will be determined by your treatment team.  Patients discharged the day of surgery will not be allowed to drive home.   Special instructions:   [facility]- Preparing For Surgery  Before surgery, you can play an important role. Because skin is not sterile, your skin needs to be as free of  germs as possible. You can reduce the number of germs on your skin by washing with CHG (chlorahexidine gluconate) Soap before surgery.  CHG is an antiseptic cleaner which kills germs and bonds with the skin to continue killing germs even after washing.  Please do not use if you have an allergy to CHG or antibacterial soaps. If your skin becomes reddened/irritated stop using the CHG.  Do not shave (including legs and underarms) for at least 48 hours prior to first CHG shower. It is OK to shave your face.  Please follow these instructions carefully.   1. Shower the NIGHT BEFORE SURGERY and the MORNING OF SURGERY with CHG.   2. If you chose to wash your hair, wash your hair first as usual with your normal shampoo.  3. After you shampoo, rinse your hair and body thoroughly to remove the shampoo.  4. Use CHG as you would any other liquid soap. You can apply CHG directly to the skin and wash gently with a scrungie or a clean washcloth.   5. Apply the CHG Soap to your body ONLY FROM THE NECK DOWN.  Do not use on open wounds or open sores. Avoid contact with your eyes, ears, mouth and genitals (private parts). Wash Face and genitals (private parts)  with your normal soap.  6. Wash thoroughly, paying special attention to the area where your surgery will be performed.  7. Thoroughly rinse your body with warm water from the neck down.  8. DO NOT shower/wash with your normal soap after using and rinsing off the CHG Soap.  9. Pat yourself dry with a CLEAN TOWEL.  10. Wear CLEAN PAJAMAS to bed the night before surgery, wear comfortable clothes the morning of surgery  11. Place CLEAN SHEETS on your bed the night of your first shower and DO NOT SLEEP WITH PETS.    Day of Surgery: Do not apply any deodorants/lotions. Please wear clean clothes to the hospital/surgery center.      Please read over the following fact sheets that you were given.

## 2016-12-24 ENCOUNTER — Encounter (HOSPITAL_COMMUNITY): Payer: Self-pay

## 2016-12-24 ENCOUNTER — Encounter (HOSPITAL_COMMUNITY)
Admission: RE | Admit: 2016-12-24 | Discharge: 2016-12-24 | Disposition: A | Discharge status: Home / Self Care | Payer: Medicare Other | Source: Ambulatory Visit | Attending: Orthopedic Surgery | Admitting: Orthopedic Surgery

## 2016-12-24 DIAGNOSIS — N186 End stage renal disease: Secondary | ICD-10-CM | POA: Diagnosis not present

## 2016-12-24 DIAGNOSIS — R109 Unspecified abdominal pain: Secondary | ICD-10-CM | POA: Diagnosis not present

## 2016-12-24 DIAGNOSIS — R197 Diarrhea, unspecified: Secondary | ICD-10-CM | POA: Diagnosis not present

## 2016-12-24 DIAGNOSIS — E1122 Type 2 diabetes mellitus with diabetic chronic kidney disease: Secondary | ICD-10-CM | POA: Diagnosis not present

## 2016-12-24 LAB — GLUCOSE, CAPILLARY: Glucose-Capillary: 227 mg/dL — ABNORMAL HIGH (ref 65–99)

## 2016-12-24 LAB — HEMOGLOBIN A1C
HEMOGLOBIN A1C: 7.1 % — AB (ref 4.8–5.6)
MEAN PLASMA GLUCOSE: 157.07 mg/dL

## 2016-12-24 MED ORDER — ONDANSETRON HCL 4 MG PO TABS: 4.0000 mg | ORAL_TABLET | Freq: Four times a day (QID) | ORAL | 0 refills | Status: DC

## 2016-12-24 MED ORDER — LOPERAMIDE HCL 2 MG PO CAPS: 2.0000 mg | ORAL_CAPSULE | Freq: Four times a day (QID) | ORAL | 0 refills | Status: DC | PRN

## 2016-12-24 NOTE — Progress Notes (Signed)
PCP - Dr. Bradly Bienenstock  Cardiologist - Denies  Chest x-ray - Denies  EKG - 12/26/16  Stress Test - 10 yrs ago  ECHO - Denies  Cardiac Cath -Denies  Sleep Study - No CPAP - None  Fasting Blood Sugar - 160, Today 227 Checks Blood Sugar ___1__ times a day  Chart will be given to anesthesia for review due to recent ER visit.  Pt denies having chest pain, sob, or fever at this time. All instructions explained to the pt, with a verbal understanding of the material. Pt agrees to go over the instructions while at home for a better understanding. The opportunity to ask questions was provided.

## 2016-12-24 NOTE — Discharge Instructions (Signed)
Follow up with your primary care doctor, take the medications as needed, Consider an outpatient colonoscopy if you have not had one recently, there was an area on the CT scan that the radiologist recommended evaluating further

## 2016-12-24 NOTE — Progress Notes (Signed)
Charles Vasquez            12/23/2016                          [facility] [address], [place] - [address] Charles Vasquez [place] [address] Phone: [phone] Fax: [phone]              Your procedure is scheduled on Thursday, December 26, 2016            Report to [facility] Admitting  Entrance "A" at 5:30 A.M.            Call this number if you have problems the morning of surgery:            [phone]             Remember:            Do not eat food or drink liquids after midnight.            Take these medicines the morning of surgery with A SIP OF WATER: ValACYclovir (VALTREX) and (REFRESH OP) eye drops. If needed OxyCODONE-acetaminophen (ROXICET) for pain and Ondansetron (ZOFRAN) for nausea.  Follow your doctor's instructions regarding your Aspirin and Plavix.  As of today, STOP taking any Aspirin, Aleve, Naproxen, Ibuprofen, Motrin, Advil, Goody's, BC's, all herbal medications, fish oil, and all vitamins.  HOW TO MANAGE YOUR DIABETES BEFORE AND AFTER SURGERY  Why is it important to control my blood sugar before and after surgery?  Improving blood sugar levels before and after surgery helps healing and can limit problems.  A way of improving blood sugar control is eating a healthy diet by: ?  Eating less sugar and carbohydrates ?  Increasing activity/exercise ?  Talking with your doctor about reaching your blood sugar goals  High blood sugars (greater than 180 mg/dL) can raise your risk of infections and slow your recovery, so you will need to focus on controlling your diabetes during the weeks before surgery.  Make sure that the doctor who takes care of your diabetes knows about your planned surgery including the date and location.  How do I manage my blood sugar before surgery?  Check your blood sugar at least 4 times a day, starting 2 days before surgery, to make sure that the level is not too high or low. ? Check your blood sugar  the morning of your surgery when you wake up and every 2 hours until you get to the Short Stay unit.  If your blood sugar is less than 70 mg/dL, you will need to treat for low blood sugar: ? Do not take insulin. ? Treat a low blood sugar (less than 70 mg/dL) with  cup of clear juice (cranberry or apple), 4glucose tablets, OR glucose gel. ? Recheck blood sugar in 15 minutes after treatment (to make sure it is greater than 70 mg/dL). If your blood sugar is not greater than 70 mg/dL on recheck, call [phone] for further instructions.  Report your blood sugar to the short stay nurse when you get to Short Stay.   If you are admitted to the hospital after surgery: ? Your blood sugar will be checked by the staff and you will probably be given insulin after surgery (instead of oral diabetes medicines) to make sure you have good blood sugar levels. ? The goal for blood sugar control after surgery is 80-180 mg/dL.  WHAT DO I DO ABOUT MY DIABETES MEDICATION?   THE NIGHT BEFORE SURGERY, take 2.5 units of  Lantus insulin.                                      If your CBG is greater than 220 mg/dL, call us at [phone]             Do not wear jewelry.            Do not wear lotions, powders, colognes, or deodorant.            Do not shave 48 hours prior to surgery.  Men may shave face and neck.            Do not bring valuables to the hospital.             [facility] is not responsible for any belongings or valuables.  Contacts, dentures or bridgework may not be worn into surgery.  Leave your suitcase in the car.  After surgery it may be brought to your room.  For patients admitted to the hospital, discharge time will be determined by your treatment team.  Patients discharged the day of surgery will not be allowed to drive home.   Special instructions:   Charles Vasquez- Preparing For Surgery  Before surgery, you can play an important role. Because skin is not sterile, your  skin needs to be as free of germs as possible. You can reduce the number of germs on your skin by washing with CHG (chlorahexidine gluconate) Soap before surgery.  CHG is an antiseptic cleaner which kills germs and bonds with the skin to continue killing germs even after washing.  Please do not use if you have an allergy to CHG or antibacterial soaps. If your skin becomes reddened/irritated stop using the CHG.  Do not shave (including legs and underarms) for at least 48 hours prior to first CHG shower. It is OK to shave your face.  Please follow these instructions carefully.                                                                                                                     1. Shower the NIGHT BEFORE SURGERY and the MORNING OF SURGERY with CHG.   2. If you chose to wash your hair, wash your hair first as usual with your normal shampoo.  3. After you shampoo, rinse your hair and body thoroughly to remove the shampoo.  4. Use CHG as you would any other liquid soap. You can apply CHG directly to the skin and wash gently with a scrungie or a clean washcloth.   5. Apply the CHG Soap to your body ONLY FROM THE NECK DOWN.  Do not use on open wounds or open sores. Avoid contact with your eyes, ears, mouth and genitals (private parts). Wash Face and genitals (private parts)  with your normal  soap.  6. Wash thoroughly, paying special attention to the area where your surgery will be performed.  7. Thoroughly rinse your body with warm water from the neck down.  8. DO NOT shower/wash with your normal soap after using and rinsing off the CHG Soap.  9. Pat yourself dry with a CLEAN TOWEL.  10. Wear CLEAN PAJAMAS to bed the night before surgery, wear comfortable clothes the morning of surgery  11. Place CLEAN SHEETS on your bed the night of your first shower and DO NOT SLEEP WITH PETS.  Day of Surgery: Do not apply any deodorants/lotions. Please wear clean clothes to the  hospital/surgery center.    Please read over the following fact sheets that you were given.

## 2016-12-25 ENCOUNTER — Encounter (HOSPITAL_COMMUNITY): Payer: Self-pay | Admitting: Emergency Medicine

## 2016-12-25 ENCOUNTER — Encounter (HOSPITAL_COMMUNITY): Payer: Self-pay | Admitting: Certified Registered Nurse Anesthetist

## 2016-12-25 DIAGNOSIS — N186 End stage renal disease: Secondary | ICD-10-CM | POA: Insufficient documentation

## 2016-12-25 DIAGNOSIS — Z79899 Other long term (current) drug therapy: Secondary | ICD-10-CM | POA: Insufficient documentation

## 2016-12-25 DIAGNOSIS — R197 Diarrhea, unspecified: Secondary | ICD-10-CM | POA: Diagnosis not present

## 2016-12-25 DIAGNOSIS — I120 Hypertensive chronic kidney disease with stage 5 chronic kidney disease or end stage renal disease: Secondary | ICD-10-CM | POA: Insufficient documentation

## 2016-12-25 DIAGNOSIS — Z992 Dependence on renal dialysis: Secondary | ICD-10-CM | POA: Insufficient documentation

## 2016-12-25 DIAGNOSIS — Z8673 Personal history of transient ischemic attack (TIA), and cerebral infarction without residual deficits: Secondary | ICD-10-CM | POA: Diagnosis not present

## 2016-12-25 DIAGNOSIS — E1122 Type 2 diabetes mellitus with diabetic chronic kidney disease: Secondary | ICD-10-CM | POA: Insufficient documentation

## 2016-12-25 DIAGNOSIS — Z7902 Long term (current) use of antithrombotics/antiplatelets: Secondary | ICD-10-CM | POA: Insufficient documentation

## 2016-12-25 DIAGNOSIS — I252 Old myocardial infarction: Secondary | ICD-10-CM | POA: Insufficient documentation

## 2016-12-25 DIAGNOSIS — Z7982 Long term (current) use of aspirin: Secondary | ICD-10-CM | POA: Insufficient documentation

## 2016-12-25 LAB — CBC
HEMATOCRIT: 34.8 % — AB (ref 39.0–52.0)
HEMOGLOBIN: 11.5 g/dL — AB (ref 13.0–17.0)
MCH: 32.3 pg (ref 26.0–34.0)
MCHC: 33 g/dL (ref 30.0–36.0)
MCV: 97.8 fL (ref 78.0–100.0)
Platelets: 152 10*3/uL (ref 150–400)
RBC: 3.56 MIL/uL — ABNORMAL LOW (ref 4.22–5.81)
RDW: 16.2 % — AB (ref 11.5–15.5)
WBC: 7.1 10*3/uL (ref 4.0–10.5)

## 2016-12-25 NOTE — ED Triage Notes (Signed)
Pt reports diarrhea onset this AM, pt was seen here for same 10/15 and dx with diarrhea of presumed infectious origin. Pt also reports mild abd cramping. No N/V.

## 2016-12-26 ENCOUNTER — Emergency Department (HOSPITAL_COMMUNITY)
Admission: EM | Admit: 2016-12-26 | Discharge: 2016-12-26 | Disposition: A | Discharge status: Home / Self Care | Payer: Medicare Other | Source: Home / Self Care | Attending: Emergency Medicine | Admitting: Emergency Medicine

## 2016-12-26 ENCOUNTER — Emergency Department (HOSPITAL_COMMUNITY)
Admission: EM | Admit: 2016-12-26 | Discharge: 2016-12-26 | Disposition: A | Discharge status: Home / Self Care | Payer: Medicare Other | Attending: Emergency Medicine | Admitting: Emergency Medicine

## 2016-12-26 ENCOUNTER — Ambulatory Visit (HOSPITAL_COMMUNITY): Admission: RE | Admit: 2016-12-26 | Payer: Medicare Other | Source: Ambulatory Visit | Admitting: Orthopedic Surgery

## 2016-12-26 ENCOUNTER — Encounter (HOSPITAL_COMMUNITY): Payer: Self-pay | Admitting: Emergency Medicine

## 2016-12-26 ENCOUNTER — Encounter (HOSPITAL_COMMUNITY): Payer: Self-pay | Admitting: Certified Registered Nurse Anesthetist

## 2016-12-26 DIAGNOSIS — N186 End stage renal disease: Secondary | ICD-10-CM | POA: Insufficient documentation

## 2016-12-26 DIAGNOSIS — Z7982 Long term (current) use of aspirin: Secondary | ICD-10-CM | POA: Insufficient documentation

## 2016-12-26 DIAGNOSIS — Z79899 Other long term (current) drug therapy: Secondary | ICD-10-CM

## 2016-12-26 DIAGNOSIS — I120 Hypertensive chronic kidney disease with stage 5 chronic kidney disease or end stage renal disease: Secondary | ICD-10-CM

## 2016-12-26 DIAGNOSIS — Z794 Long term (current) use of insulin: Secondary | ICD-10-CM | POA: Insufficient documentation

## 2016-12-26 DIAGNOSIS — E1122 Type 2 diabetes mellitus with diabetic chronic kidney disease: Secondary | ICD-10-CM

## 2016-12-26 DIAGNOSIS — Z7902 Long term (current) use of antithrombotics/antiplatelets: Secondary | ICD-10-CM

## 2016-12-26 DIAGNOSIS — R197 Diarrhea, unspecified: Secondary | ICD-10-CM

## 2016-12-26 DIAGNOSIS — I10 Essential (primary) hypertension: Secondary | ICD-10-CM

## 2016-12-26 LAB — COMPREHENSIVE METABOLIC PANEL
ALT: 38 U/L (ref 17–63)
AST: 33 U/L (ref 15–41)
Albumin: 3.5 g/dL (ref 3.5–5.0)
Alkaline Phosphatase: 148 U/L — ABNORMAL HIGH (ref 38–126)
Anion gap: 11 (ref 5–15)
BUN: 26 mg/dL — ABNORMAL HIGH (ref 6–20)
CHLORIDE: 95 mmol/L — AB (ref 101–111)
CO2: 29 mmol/L (ref 22–32)
CREATININE: 6.17 mg/dL — AB (ref 0.61–1.24)
Calcium: 8.4 mg/dL — ABNORMAL LOW (ref 8.9–10.3)
GFR calc non Af Amer: 8 mL/min — ABNORMAL LOW (ref >60)
GFR, EST AFRICAN AMERICAN: 9 mL/min — AB (ref >60)
Glucose, Bld: 269 mg/dL — ABNORMAL HIGH (ref 65–99)
Potassium: 3.8 mmol/L (ref 3.5–5.1)
SODIUM: 135 mmol/L (ref 135–145)
Total Bilirubin: 0.6 mg/dL (ref 0.3–1.2)
Total Protein: 7.4 g/dL (ref 6.5–8.1)

## 2016-12-26 LAB — LIPASE, BLOOD: LIPASE: 25 U/L (ref 11–51)

## 2016-12-26 LAB — CBG MONITORING, ED: Glucose-Capillary: 187 mg/dL — ABNORMAL HIGH (ref 65–99)

## 2016-12-26 MED ORDER — ONDANSETRON 4 MG PO TBDP
8.0000 mg | ORAL_TABLET | Freq: Once | ORAL | Status: AC
  Administered 2016-12-26: 8 mg via ORAL
  Filled 2016-12-26: qty 2

## 2016-12-26 MED ORDER — CEFAZOLIN SODIUM-DEXTROSE 2-4 GM/100ML-% IV SOLN
2.0000 g | INTRAVENOUS | Status: DC
  Filled 2016-12-26: qty 100

## 2016-12-26 MED ORDER — CHLORHEXIDINE GLUCONATE 4 % EX LIQD: 60.0000 mL | Freq: Once | CUTANEOUS | Status: DC

## 2016-12-26 NOTE — ED Notes (Addendum)
Pt ambulatory in hallways independently with walker. Pt denies any dizziness or lightheadedness. Pt reports nausea but no pain. Toileting offered and pt reported he did not have to go at this time either. EDP aware.

## 2016-12-26 NOTE — ED Provider Notes (Signed)
[facility] EMERGENCY DEPARTMENT Provider Note   CSN: [id number] Arrival date & time: 12/25/16  2303     History   Chief Complaint Chief Complaint  Patient presents with  . Diarrhea    HPI Charles Vasquez is a 81 y.o. male.  The history is provided by the patient.  Diarrhea   This is a recurrent problem. The current episode started 12 to 24 hours ago. The problem has been gradually worsening. There has been no fever. Associated symptoms include abdominal pain, vomiting and chills. Pertinent negatives include no cough. He has tried nothing for the symptoms.  patient with h/o ESRD (dialysis MWF, no missed sessions) diabetes, previous stroke presents with diarrhea He was seen in the ED on 10/15 for diarrhea, had labs/CT imaging performed and discharged He had some improvement in diarrhea but it returned over 12 hrs ago He also has abdominal discomfort  He vomited while waiting in the ED No bloody stool No travel   Past Medical History:  Diagnosis Date  . Diabetes mellitus without complication (HCC)    lantus daily, type2  . Dizziness   . ESRD (end stage renal disease) on dialysis (HCC)    "[facility]; MWF" (10/22/2016)  . History of blood transfusion   . Hypertension   . Myocardial infarction (HCC)   . Pneumonia    last yr  . Stroke [facility])     Patient Active Problem List   Diagnosis Date Noted  . ESRD (end stage renal disease) on dialysis (HCC) 10/22/2016  . Venous hypertension status post AVG procedure 08/10/2016  . Anasarca 08/04/2016  . Facial swelling 08/04/2016  . Effusion, other site 08/04/2016  . 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 Acute respiratory failure (HCC) 08/04/2016  . Hyperkalemia 08/04/2016  . Aftercare following surgery of the circulatory system, NEC-Right  AVGG 09/25/2012  . Other complications due to renal dialysis device, implant, and graft 05/01/2012  . End stage renal disease (HCC) 03/17/2012  . FATIGUE, ACUTE 08/22/2008  . HEMATURIA  UNSPECIFIED 07/11/2008  . Diabetes (HCC) 01/19/2008  . Essential hypertension 01/19/2008  . MYOCARDIAL INFARCTION, HX OF 01/19/2008  . GERD 01/19/2008  . OSTEOARTHRITIS 01/19/2008  . CEREBROVASCULAR ACCIDENT, HX OF 01/19/2008    Past Surgical History:  Procedure Laterality Date  . ARTERIOVENOUS GRAFT PLACEMENT    . ARTERIOVENOUS GRAFT PLACEMENT Left 10/22/2016   thigh  . AV FISTULA PLACEMENT  03/31/2012   Procedure: ARTERIOVENOUS (AV) FISTULA CREATION;  Surgeon: [name], MD;  Location: [facility] OR;  Service: Vascular;  Laterality: Right;  First stage Brachial vein transposition  . AV FISTULA PLACEMENT Right 08/25/2012   Procedure: INSERTION OF ARTERIOVENOUS (AV) GORE-TEX GRAFT ARM;  Surgeon: [name], MD;  Location: [facility] OR;  Service: Vascular;  Laterality: Right;  . AV FISTULA PLACEMENT Left 07/23/2016   Procedure: INSERTION OF ARTERIOVENOUS (AV) GORE-TEX GRAFT LEFT UPPER ARM;  Surgeon: [name], MD;  Location: [facility] OR;  Service: Vascular;  Laterality: Left;  . AV FISTULA PLACEMENT Left 10/22/2016   Procedure: INSERTION OF 4-42mm x 45cm  ARTERIOVENOUS (AV) GORE-TEX GRAFT THIGH-LEFT;  Surgeon: [name], MD;  Location: [facility] OR;  Service: Vascular;  Laterality: Left;  . EYE SURGERY Bilateral    cataracts  . INSERTION OF DIALYSIS CATHETER Left 05/05/2012   Procedure: INSERTION OF DIALYSIS CATHETER;  Surgeon: Chuck Hint, MD;  Location: [facility] OR;  Service: Vascular;  Laterality: Left;  . INSERTION OF DIALYSIS CATHETER Right 10/01/2016   Procedure: INSERTION OF  DIALYSIS CATHETER- RIGHT FEMORAL;  Surgeon: [name], MD;  Location: [facility] OR;  Service: Vascular;  Laterality: Right;  . IR FLUORO GUIDE CV LINE RIGHT  06/10/2016  . IR GENERIC HISTORICAL  06/07/2016   IR US GUIDE VASC ACCESS RIGHT 06/07/2016 Oley Balm, MD [facility]-INTERV RAD  . IR THROMBECTOMY AV FISTULA W/THROMBOLYSIS/PTA INC/SHUNT/IMG RIGHT Right 06/07/2016  . IR US GUIDE VASC ACCESS RIGHT  06/10/2016  . LIGATION ARTERIOVENOUS  GORTEX GRAFT Left 08/04/2016   Procedure: LIGATION ARTERIOVENOUS GORTEX GRAFT;  Surgeon: Larina Earthly, MD;  Location: [facility] OR;  Service: Vascular;  Laterality: Left;  . LIGATION ARTERIOVENOUS GORTEX GRAFT Right 11/26/2016   Procedure: LIGATION OF RIGHT UPPER ARM ARTERIOVENOUS GORTEX GRAFT;  Surgeon: [name], MD;  Location: [facility] OR;  Service: Vascular;  Laterality: Right;  . LIGATION OF ARTERIOVENOUS  FISTULA Right 08/25/2012   Procedure: LIGATION OF ARTERIOVENOUS  FISTULA;  Surgeon: [name], MD;  Location: [facility] OR;  Service: Vascular;  Laterality: Right;  Ligation of right brachial vein transposition  . REMOVAL OF A DIALYSIS CATHETER Right 05/05/2012   Procedure: REMOVAL OF A DIALYSIS CATHETER;  Surgeon: Chuck Hint, MD;  Location: [facility] OR;  Service: Vascular;  Laterality: Right;  . REMOVAL OF A DIALYSIS CATHETER Right 10/01/2016   Procedure: REMOVAL OF A DIALYSIS CATHETER-RIGHT UPPER CHEST;  Surgeon: [name], MD;  Location: [facility] OR;  Service: Vascular;  Laterality: Right;  . REMOVAL OF A DIALYSIS CATHETER Right 11/26/2016   Procedure: REMOVAL OF RIGHT FEMORAL TUNNEL DIALYSIS CATHETER;  Surgeon: [name], MD;  Location: [facility] OR;  Service: Vascular;  Laterality: Right;  . TOE AMPUTATION    . UPPER EXTREMITY VENOGRAPHY Bilateral 07/18/2016   Procedure: Bilateral Upper Extremity Venography;  Surgeon: [name], MD;  Location: [facility] INVASIVE CV LAB;  Service: Cardiovascular;  Laterality: Bilateral;       Home Medications    Prior to Admission medications   Medication Sig Start Date End Date Taking? Authorizing Provider  aspirin EC 81 MG tablet Take 81 mg by mouth daily.    [provider]  bacitracin ophthalmic ointment Place 1 application into the left eye at bedtime. apply to eye    [provider]  clopidogrel (PLAVIX) 75 MG tablet Take 75 mg by mouth at bedtime.  11/20/16   [provider]  Darbepoetin Alfa (ARANESP) 60 MCG/0.3ML SOSY injection  Inject 0.3 mLs (60 mcg total) into the vein every Friday with hemodialysis. Patient not taking: Reported on 12/19/2016 08/16/16   Rama, Maryruth Bun, MD  insulin glargine (LANTUS) 100 UNIT/ML injection Inject 5 Units into the skin at bedtime.     [provider]  latanoprost (XALATAN) 0.005 % ophthalmic solution Place 1 drop into the left eye at bedtime. 10/15/16   [provider]  loperamide (IMODIUM) 2 MG capsule Take 1 capsule (2 mg total) by mouth 4 (four) times daily as needed for diarrhea or loose stools. 12/24/16   Linwood Dibbles, MD  losartan (COZAAR) 100 MG tablet Take 100 mg by mouth daily.    [provider]  multivitamin (RENA-VIT) TABS tablet Take 1 tablet by mouth daily.    [provider]  ondansetron (ZOFRAN) 4 MG tablet Take 1 tablet (4 mg total) by mouth every 6 (six) hours. 12/24/16   Linwood Dibbles, MD  oxyCODONE-acetaminophen (ROXICET) 5-325 MG tablet Take 1-2 tablets by mouth every 6 (six) hours as needed for moderate pain. 11/26/16 11/26/17  [name]  L, MD  Polyvinyl Alcohol-Povidone (REFRESH OP) Place 1 drop into both eyes 2 (two) times daily.    [provider]  valACYclovir (VALTREX) 1000 MG tablet Take 1,000 mg by mouth daily.     [provider]    Family History Family History  Problem Relation Age of Onset  . Diabetes Mother   . Hypertension Mother   . Diabetes Father   . Hypertension Father     Social History Social History  Substance Use Topics  . Smoking status: Never Smoker  . Smokeless tobacco: Never Used  . Alcohol use No     Allergies   Lisinopril   Review of Systems Review of Systems  Constitutional: Positive for chills.  Respiratory: Negative for cough.   Gastrointestinal: Positive for abdominal pain, diarrhea and vomiting.  All other systems reviewed and are negative.    Physical Exam Updated Vital Signs BP (!) 155/51   Pulse 72   Temp 97.7 F (36.5 C) (Oral)   Resp 18   Ht 1.702 m (5'  7")   Wt 63.5 kg (140 lb)   SpO2 100%   BMI 21.93 kg/m   Physical Exam CONSTITUTIONAL: Chronically ill appearing, no distress HEAD: Normocephalic/atraumatic EYES: EOMI/PERRL ENMT: Mucous membranes moist NECK: supple no meningeal signs SPINE/BACK:entire spine nontender CV: S1/S2 noted, no murmurs/rubs/gallops noted LUNGS: Lungs are clear to auscultation bilaterally, no apparent distress ABDOMEN: soft, nontender, no rebound or guarding, bowel sounds noted throughout abdomen GU:no cva tenderness NEURO: Pt is awake/alert/appropriate, moves all extremitiesx4.    EXTREMITIES: dialysis access to left thigh, thrill noted SKIN: warm, color normal PSYCH: no abnormalities of mood noted, alert and oriented to situation   ED Treatments / Results  Labs (all labs ordered are listed, but only abnormal results are displayed) Labs Reviewed  COMPREHENSIVE METABOLIC PANEL - Abnormal; Notable for the following:       Result Value   Chloride 95 (*)    Glucose, Bld 269 (*)    BUN 26 (*)    Creatinine, Ser 6.17 (*)    Calcium 8.4 (*)    Alkaline Phosphatase 148 (*)    GFR calc non Af Amer 8 (*)    GFR calc Af Amer 9 (*)    All other components within normal limits  CBC - Abnormal; Notable for the following:    RBC 3.56 (*)    Hemoglobin 11.5 (*)    HCT 34.8 (*)    RDW 16.2 (*)    All other components within normal limits  LIPASE, BLOOD    EKG  EKG Interpretation None       Radiology No results found.  Procedures Procedures (including critical care time)  Medications Ordered in ED Medications  ondansetron (ZOFRAN-ODT) disintegrating tablet 8 mg (8 mg Oral Given 12/26/16 0418)     Initial Impression / Assessment and Plan / ED Course  I have reviewed the triage vital signs and the nursing notes.  Pertinent labs   results that were available during my care of the patient were reviewed by me and considered in my medical decision making (see chart for details).    5:26 AM Pt  in the ED for recurrent diarrhea He had some vomiting while waiting Currently pt is resting comfortably He is taking PO Overall, labs reassuring/near baseline He is not toxic appearing abd soft/nontender Will continue PO challenge   Pt improved Ambulatory without dizziness No vomiting I feel he is appropriate for d/c  Apparently, he is supposed  to have right finger amputation today Hand surgeon wants to see him despite having diarrhea and taking PO fluids Will d/c to short stay    Final Clinical Impressions(s) / ED Diagnoses   Final diagnoses:  Diarrhea of presumed infectious origin    New Prescriptions New Prescriptions   No medications on file     Zadie RhineWickline, Dewanda Fennema, MD 12/26/16 [phone]

## 2016-12-26 NOTE — ED Notes (Signed)
ED Provider at bedside. 

## 2016-12-26 NOTE — ED Provider Notes (Signed)
[facility]ENC[facility] Provider Note   CSN: [id number] Arrival date & time: 12/26/16  [id number]     History   Chief Complaint Chief Complaint  Patient presents with  . Hypertension  . Diarrhea    HPI Charles Vasquez is a 81 y.o. male.  Patient presents to the ED after having his surgery canceled this morning secondary to diarrhea and hypertension. He was scheduled to have a partial amputation of his right index finger today, but his blood pressure was elevated.He presented to the emergency department for a blood pressure recheck. Additionally, he has had diarrhea recently. Past medical history includes end-stage renal disease with dialysis on Monday Wednesday Friday, diabetes,hypertension, CAD, CVA. No specific complaints today.  Patient's wife reports normal behavior.Surgery has been rescheduled for Saturday.  He is not taking his blood pressure medicine the last 2 days days.      Past Medical History:  Diagnosis Date  . Diabetes mellitus without complication (HCC)    lantus daily, type2  . Dizziness   . ESRD (end stage renal disease) on dialysis (HCC)    "[facility]; MWF" (10/22/2016)  . History of blood transfusion   . Hypertension   . Myocardial infarction (HCC)   . Pneumonia    last yr  . Stroke [facility](HCC)     Patient Active Problem List   Diagnosis Date Noted  . ESRD (end stage renal disease) on dialysis (HCC) 10/22/2016  . Venous hypertension status post AVG procedure 08/10/2016  . Anasarca 08/04/2016  . Facial swelling 08/04/2016  . Effusion, other site 08/04/2016  . 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 Acute respiratory failure (HCC) 08/04/2016  . Hyperkalemia 08/04/2016  . Aftercare following surgery of the circulatory system, NEC-Right  AVGG 09/25/2012  . Other complications due to renal dialysis device, implant, and graft 05/01/2012  . End stage renal disease (HCC) 03/17/2012  . FATIGUE, ACUTE 08/22/2008  . HEMATURIA UNSPECIFIED 07/11/2008  .  Diabetes (HCC) 01/19/2008  . Essential hypertension 01/19/2008  . MYOCARDIAL INFARCTION, HX OF 01/19/2008  . GERD 01/19/2008  . OSTEOARTHRITIS 01/19/2008  . CEREBROVASCULAR ACCIDENT, HX OF 01/19/2008    Past Surgical History:  Procedure Laterality Date  . ARTERIOVENOUS GRAFT PLACEMENT    . ARTERIOVENOUS GRAFT PLACEMENT Left 10/22/2016   thigh  . AV FISTULA PLACEMENT  03/31/2012   Procedure: ARTERIOVENOUS (AV) FISTULA CREATION;  Surgeon: Fransisco HertzBrian L Chen, MD;  [facility];  Service: Vascular;  Laterality: Right;  First stage Brachial vein transposition  . AV FISTULA PLACEMENT Right 08/25/2012   Procedure: INSERTION OF ARTERIOVENOUS (AV) GORE-TEX GRAFT ARM;  Surgeon: Fransisco HertzBrian L Chen, MD;  Location: [facility] OR;  Service: Vascular;  Laterality: Right;  . AV FISTULA PLACEMENT Left 07/23/2016   Procedure: INSERTION OF ARTERIOVENOUS (AV) GORE-TEX GRAFT LEFT UPPER ARM;  Surgeon: Fransisco Hertzhen, Jahnasia Tatum L, MD;  [facility];  Service: Vascular;  Laterality: Left;  . AV FISTULA PLACEMENT Left 10/22/2016   Procedure: INSERTION OF 4-407mm x 45cm  ARTERIOVENOUS (AV) GORE-TEX GRAFT THIGH-LEFT;  Surgeon: Fransisco Hertzhen, Daleen Steinhaus L, MD;  [facility];  Service: Vascular;  Laterality: Left;  . EYE SURGERY Bilateral    cataracts  . INSERTION OF DIALYSIS CATHETER Left 05/05/2012   Procedure: INSERTION OF DIALYSIS CATHETER;  Surgeon: Chuck Hinthristopher S Dickson, MD;  [facility];  Service: Vascular;  Laterality: Left;  . INSERTION OF DIALYSIS CATHETER Right 10/01/2016   Procedure: INSERTION OF DIALYSIS CATHETER- RIGHT FEMORAL;  Surgeon: Fransisco Hertzhen, Tnya Ades L, MD;  Location: [facility] OR;  Service: Vascular;  Laterality: Right;  . IR FLUORO GUIDE CV LINE RIGHT  06/10/2016  . IR GENERIC HISTORICAL  06/07/2016   IR US GUIDE VASC ACCESS RIGHT 06/07/2016 Oley Balm, MD [facility]-INTERV RAD  . IR THROMBECTOMY AV FISTULA W/THROMBOLYSIS/PTA INC/SHUNT/IMG RIGHT Right 06/07/2016  . IR US GUIDE VASC ACCESS RIGHT  06/10/2016  . LIGATION ARTERIOVENOUS GORTEX GRAFT Left  08/04/2016   Procedure: LIGATION ARTERIOVENOUS GORTEX GRAFT;  Surgeon: Larina Earthly, MD;  Location[facility] OR;  Service: Vascular;  Laterality: Left;  . LIGATION ARTERIOVENOUS GORTEX GRAFT Right 11/26/2016   Procedure: LIGATION OF RIGHT UPPER ARM ARTERIOVENOUS GORTEX GRAFT;  Surgeon: [name], MD;  Location[facility] OR;  Service: Vascular;  Laterality: Right;  . LIGATION OF ARTERIOVENOUS  FISTULA Right 08/25/2012   Procedure: LIGATION OF ARTERIOVENOUS  FISTULA;  Surgeon: [name], MD;  Location: [facility] OR;  Service: Vascular;  Laterality: Right;  Ligation of right brachial vein transposition  . REMOVAL OF A DIALYSIS CATHETER Right 05/05/2012   Procedure: REMOVAL OF A DIALYSIS CATHETER;  Surgeon: Chuck Hint, MD;  Location: [facility] OR;  Service: Vascular;  Laterality: Right;  . REMOVAL OF A DIALYSIS CATHETER Right 10/01/2016   Procedure: REMOVAL OF A DIALYSIS CATHETER-RIGHT UPPER CHEST;  Surgeon: [name], MD;  Location: [facility] OR;  Service: Vascular;  Laterality: Right;  . REMOVAL OF A DIALYSIS CATHETER Right 11/26/2016   Procedure: REMOVAL OF RIGHT FEMORAL TUNNEL DIALYSIS CATHETER;  Surgeon: [name], MD;  Location: [facility] OR;  Service: Vascular;  Laterality: Right;  . TOE AMPUTATION    . UPPER EXTREMITY VENOGRAPHY Bilateral 07/18/2016   Procedure: Bilateral Upper Extremity Venography;  Surgeon: [name], MD;  Location: [facility] INVASIVE CV LAB;  Service: Cardiovascular;  Laterality: Bilateral;       Home Medications    Prior to Admission medications   Medication Sig Start Date End Date Taking? Authorizing Provider  aspirin EC 81 MG tablet Take 81 mg by mouth daily.    [provider]  bacitracin ophthalmic ointment Place 1 application into the left eye at bedtime. apply to eye    [provider]  clopidogrel (PLAVIX) 75 MG tablet Take 75 mg by mouth at bedtime.  11/20/16   [provider]  Darbepoetin Alfa (ARANESP) 60 MCG/0.3ML SOSY injection Inject 0.3 mLs (60 mcg  total) into the vein every Friday with hemodialysis. Patient not taking: Reported on 12/19/2016 08/16/16   Rama, Maryruth Bun, MD  insulin glargine (LANTUS) 100 UNIT/ML injection Inject 5 Units into the skin at bedtime.     [provider]  latanoprost (XALATAN) 0.005 % ophthalmic solution Place 1 drop into the left eye at bedtime. 10/15/16   [provider]  loperamide (IMODIUM) 2 MG capsule Take 1 capsule (2 mg total) by mouth 4 (four) times daily as needed for diarrhea or loose stools. 12/24/16   Linwood Dibbles, MD  losartan (COZAAR) 100 MG tablet Take 100 mg by mouth daily.    [provider]  multivitamin (RENA-VIT) TABS tablet Take 1 tablet by mouth daily.    [provider]  ondansetron (ZOFRAN) 4 MG tablet Take 1 tablet (4 mg total) by mouth every 6 (six) hours. 12/24/16   Linwood Dibbles, MD  oxyCODONE-acetaminophen (ROXICET) 5-325 MG tablet Take 1-2 tablets by mouth every 6 (six) hours as needed for moderate pain. 11/26/16 11/26/17  [name], MD  Polyvinyl Alcohol-Povidone (REFRESH OP) Place 1 drop into both eyes 2 (two)  times daily.    [provider]  valACYclovir (VALTREX) 1000 MG tablet Take 1,000 mg by mouth daily.     [provider]    Family History Family History  Problem Relation Age of Onset  . Diabetes Mother   . Hypertension Mother   . Diabetes Father   . Hypertension Father     Social History Social History  Substance Use Topics  . Smoking status: Never Smoker  . Smokeless tobacco: Never Used  . Alcohol use No     Allergies   Lisinopril   Review of Systems Review of Systems  All other systems reviewed and are negative.    Physical Exam Updated Vital Signs BP (!) 172/62   Pulse 71   Temp 97.7 F (36.5 C) (Oral)   Resp 16   Ht 5\' 7"  (1.702 m)   Wt 63.5 kg (140 lb)   SpO2 100%   BMI 21.93 kg/m   Physical Exam  Constitutional: He is oriented to person, place, and time. He appears well-developed and  well-nourished.  HENT:  Head: Normocephalic and atraumatic.  Eyes: Conjunctivae are normal.  Neck: Neck supple.  Cardiovascular: Normal rate and regular rhythm.   Pulmonary/Chest: Effort normal and breath sounds normal.  Abdominal: Soft. Bowel sounds are normal.  Musculoskeletal: Normal range of motion.  Neurological: He is alert and oriented to person, place, and time.  Skin: Skin is warm and dry.  Psychiatric: He has a normal mood and affect. His behavior is normal.  Nursing note and vitals reviewed.    ED Treatments / Results  Labs (all labs ordered are listed, but only abnormal results are displayed) Labs Reviewed - No data to display  EKG  EKG Interpretation None       Radiology No results found.  Procedures Procedures (including critical care time)  Medications Ordered in ED Medications - No data to display   Initial Impression / Assessment and Plan / ED Course  I have reviewed the triage vital signs and the nursing notes.  Pertinent labs & imaging results that were available during my care of the patient were reviewed by me and considered in my medical decision making (see chart for details).   Patient is in no acute distress. Blood pressure readings in the ED were 149-172/58-67.  Patient is stable for discharge.    Final Clinical Impressions(s) / ED Diagnoses   Final diagnoses:  Hypertension, unspecified type  ESRD (end stage renal disease) [facility])    New Prescriptions Discharge Medication List as of 12/26/2016 10:44 AM       Donnetta Hutching, MD 12/26/16 1536

## 2016-12-26 NOTE — ED Notes (Addendum)
Pt was planning on having operation on right index finger today r/t "poor circulation" pt was found to be hypertensive and sent to ED. BP per wife was 219/83 at OR waiting this morning. Wife also reports that he was told to stop taking his BP med's and blood thinners prior to operation. Surgery has already been rescheduled for Saturday per wife.

## 2016-12-26 NOTE — ED Notes (Addendum)
Pt given Sprite zero. This RN encouraged pt to drink for oral hydration. Pt tolerating well. Will continue to monitor.

## 2016-12-26 NOTE — ED Notes (Signed)
Pt verbalizes understanding of d/c instructions. Pt taken to short stay room 25 for possible surgery at d/c with all belongings.

## 2016-12-26 NOTE — ED Triage Notes (Signed)
Pt brought to ED from OR holding-- was in the ED last night with diarrhea and vomiting- had surgery scheduled for this am. Per anesthesia-- BP too high for surgery today, also has had an episode of diarrhea this am.

## 2016-12-26 NOTE — Discharge Instructions (Signed)
Take your normal blood pressure medications.  Fluids.  Go to dialysis tomorrow

## 2016-12-26 NOTE — ED Notes (Signed)
Family removed pt from monitor while waiting for DC paperwork

## 2016-12-26 NOTE — Progress Notes (Signed)
Pt came to short stay from ED for surgery today. Pt with no nausea on arrival. Pt drank sprite zero at 04:30 AM. Unable to do surgery today d/t delay and pt rescheduled for Saturday. Pt BP in short stay 214/218 systolic. Pt asymptomatic (no chest pain, dizziness, headache). Pt with one episode diarrhea in short stay. Pt discharged from short stay to ED per anesthesia. Pt has not had BP medicine since Monday.   Pt usually takes Aspirin and Plavix, was told to stop this at pre-op appointment. Called Dr. Ronie Spies office this am and was referred to Dr. Merlyn Lot on call who deferred to Dr. Mina Marble on this. Pt girlfriend to call Dr. Ronie Spies office after 08:30 AM to get instructions regarding Aspirin and Plavix.

## 2016-12-27 ENCOUNTER — Encounter: Payer: Medicare Other | Admitting: Vascular Surgery

## 2016-12-28 SURGERY — AMPUTATION DIGIT
Anesthesia: General | Site: Finger | Laterality: Right

## 2016-12-31 ENCOUNTER — Other Ambulatory Visit: Payer: Self-pay | Admitting: Orthopedic Surgery

## 2016-12-31 NOTE — Progress Notes (Signed)
There were no answers or voice mail on patients numbers.  I called patients friend, Charles Vasquez, "perferred number" and left arrival time of 1250 tomorrow and I asked her to call pre op desk when she recives the message and confirm that she received the message.

## 2017-01-01 ENCOUNTER — Encounter (HOSPITAL_COMMUNITY): Payer: Self-pay | Admitting: *Deleted

## 2017-01-01 ENCOUNTER — Ambulatory Visit (HOSPITAL_COMMUNITY): Payer: Medicare Other | Admitting: Certified Registered Nurse Anesthetist

## 2017-01-01 ENCOUNTER — Ambulatory Visit (HOSPITAL_COMMUNITY)
Admission: RE | Admit: 2017-01-01 | Discharge: 2017-01-01 | Disposition: A | Discharge status: Home / Self Care | Payer: Medicare Other | Source: Ambulatory Visit | Attending: Orthopedic Surgery | Admitting: Orthopedic Surgery

## 2017-01-01 ENCOUNTER — Encounter (HOSPITAL_COMMUNITY)
Admission: RE | Disposition: A | Discharge status: Home / Self Care | Payer: Self-pay | Source: Ambulatory Visit | Attending: Orthopedic Surgery

## 2017-01-01 DIAGNOSIS — Z7902 Long term (current) use of antithrombotics/antiplatelets: Secondary | ICD-10-CM | POA: Diagnosis not present

## 2017-01-01 DIAGNOSIS — I120 Hypertensive chronic kidney disease with stage 5 chronic kidney disease or end stage renal disease: Secondary | ICD-10-CM | POA: Insufficient documentation

## 2017-01-01 DIAGNOSIS — Z888 Allergy status to other drugs, medicaments and biological substances status: Secondary | ICD-10-CM | POA: Insufficient documentation

## 2017-01-01 DIAGNOSIS — I252 Old myocardial infarction: Secondary | ICD-10-CM | POA: Insufficient documentation

## 2017-01-01 DIAGNOSIS — N186 End stage renal disease: Secondary | ICD-10-CM | POA: Diagnosis not present

## 2017-01-01 DIAGNOSIS — Z8673 Personal history of transient ischemic attack (TIA), and cerebral infarction without residual deficits: Secondary | ICD-10-CM | POA: Insufficient documentation

## 2017-01-01 DIAGNOSIS — M1990 Unspecified osteoarthritis, unspecified site: Secondary | ICD-10-CM | POA: Insufficient documentation

## 2017-01-01 DIAGNOSIS — Z7982 Long term (current) use of aspirin: Secondary | ICD-10-CM | POA: Insufficient documentation

## 2017-01-01 DIAGNOSIS — K219 Gastro-esophageal reflux disease without esophagitis: Secondary | ICD-10-CM | POA: Diagnosis not present

## 2017-01-01 DIAGNOSIS — Z992 Dependence on renal dialysis: Secondary | ICD-10-CM | POA: Insufficient documentation

## 2017-01-01 DIAGNOSIS — E1122 Type 2 diabetes mellitus with diabetic chronic kidney disease: Secondary | ICD-10-CM | POA: Diagnosis not present

## 2017-01-01 DIAGNOSIS — M869 Osteomyelitis, unspecified: Secondary | ICD-10-CM | POA: Insufficient documentation

## 2017-01-01 DIAGNOSIS — I998 Other disorder of circulatory system: Secondary | ICD-10-CM | POA: Diagnosis present

## 2017-01-01 DIAGNOSIS — Z794 Long term (current) use of insulin: Secondary | ICD-10-CM | POA: Diagnosis not present

## 2017-01-01 HISTORY — PX: AMPUTATION: SHX166

## 2017-01-01 LAB — POCT I-STAT 4, (NA,K, GLUC, HGB,HCT)
Glucose, Bld: 67 mg/dL (ref 65–99)
HCT: 34 % — ABNORMAL LOW (ref 39.0–52.0)
Hemoglobin: 11.6 g/dL — ABNORMAL LOW (ref 13.0–17.0)
Potassium: 3.6 mmol/L (ref 3.5–5.1)
Sodium: 137 mmol/L (ref 135–145)

## 2017-01-01 LAB — GLUCOSE, CAPILLARY
Glucose-Capillary: 59 mg/dL — ABNORMAL LOW (ref 65–99)
Glucose-Capillary: 99 mg/dL (ref 65–99)
Glucose-Capillary: 84 mg/dL (ref 65–99)

## 2017-01-01 SURGERY — AMPUTATION DIGIT
Anesthesia: General | Laterality: Right

## 2017-01-01 MED ORDER — CHLORHEXIDINE GLUCONATE 4 % EX LIQD: 60.0000 mL | Freq: Once | CUTANEOUS | Status: DC

## 2017-01-01 MED ORDER — PHENYLEPHRINE HCL 10 MG/ML IJ SOLN
INTRAVENOUS | Status: DC | PRN
  Administered 2017-01-01: 25 ug/min via INTRAVENOUS

## 2017-01-01 MED ORDER — OXYCODONE-ACETAMINOPHEN 5-325 MG PO TABS: 1.0000 | ORAL_TABLET | ORAL | 0 refills | Status: DC | PRN

## 2017-01-01 MED ORDER — ONDANSETRON HCL 4 MG/2ML IJ SOLN: 4.0000 mg | Freq: Once | INTRAMUSCULAR | Status: DC | PRN

## 2017-01-01 MED ORDER — DEXAMETHASONE SODIUM PHOSPHATE 4 MG/ML IJ SOLN
INTRAMUSCULAR | Status: DC | PRN
  Administered 2017-01-01: 5 mg via INTRAVENOUS

## 2017-01-01 MED ORDER — FENTANYL CITRATE (PF) 250 MCG/5ML IJ SOLN
INTRAMUSCULAR | Status: AC
  Filled 2017-01-01: qty 5

## 2017-01-01 MED ORDER — ONDANSETRON HCL 4 MG/2ML IJ SOLN
INTRAMUSCULAR | Status: DC | PRN
  Administered 2017-01-01: 4 mg via INTRAVENOUS

## 2017-01-01 MED ORDER — DEXTROSE 50 % IV SOLN
25.0000 mL | Freq: Once | INTRAVENOUS | Status: DC
  Filled 2017-01-01: qty 50

## 2017-01-01 MED ORDER — PROPOFOL 10 MG/ML IV BOLUS
INTRAVENOUS | Status: DC | PRN
  Administered 2017-01-01: 100 mg via INTRAVENOUS
  Administered 2017-01-01: 50 mg via INTRAVENOUS

## 2017-01-01 MED ORDER — 0.9 % SODIUM CHLORIDE (POUR BTL) OPTIME
TOPICAL | Status: DC | PRN
  Administered 2017-01-01: 1000 mL

## 2017-01-01 MED ORDER — SODIUM CHLORIDE 0.9 % IV SOLN
INTRAVENOUS | Status: DC
  Administered 2017-01-01: 13:00:00 via INTRAVENOUS

## 2017-01-01 MED ORDER — LIDOCAINE 2% (20 MG/ML) 5 ML SYRINGE
INTRAMUSCULAR | Status: DC | PRN
  Administered 2017-01-01: 100 mg via INTRAVENOUS

## 2017-01-01 MED ORDER — ONDANSETRON HCL 4 MG/2ML IJ SOLN
INTRAMUSCULAR | Status: AC
  Filled 2017-01-01: qty 2

## 2017-01-01 MED ORDER — LIDOCAINE 2% (20 MG/ML) 5 ML SYRINGE
INTRAMUSCULAR | Status: AC
  Filled 2017-01-01: qty 5

## 2017-01-01 MED ORDER — DEXTROSE 50 % IV SOLN
INTRAVENOUS | Status: AC
  Administered 2017-01-01: 25 mL
  Filled 2017-01-01: qty 50

## 2017-01-01 MED ORDER — CEFAZOLIN SODIUM-DEXTROSE 2-4 GM/100ML-% IV SOLN
INTRAVENOUS | Status: AC
  Filled 2017-01-01: qty 100

## 2017-01-01 MED ORDER — DEXTROSE 5 % IV SOLN
INTRAVENOUS | Status: AC
  Filled 2017-01-01: qty 1.5

## 2017-01-01 MED ORDER — DEXAMETHASONE SODIUM PHOSPHATE 10 MG/ML IJ SOLN
INTRAMUSCULAR | Status: AC
  Filled 2017-01-01: qty 1

## 2017-01-01 MED ORDER — FENTANYL CITRATE (PF) 100 MCG/2ML IJ SOLN: 25.0000 ug | INTRAMUSCULAR | Status: DC | PRN

## 2017-01-01 MED ORDER — BUPIVACAINE HCL (PF) 0.25 % IJ SOLN
INTRAMUSCULAR | Status: AC
  Filled 2017-01-01: qty 30

## 2017-01-01 MED ORDER — FENTANYL CITRATE (PF) 100 MCG/2ML IJ SOLN
INTRAMUSCULAR | Status: DC | PRN
  Administered 2017-01-01: 25 ug via INTRAVENOUS

## 2017-01-01 MED ORDER — PROPOFOL 10 MG/ML IV BOLUS
INTRAVENOUS | Status: AC
  Filled 2017-01-01: qty 20

## 2017-01-01 MED ORDER — CEFAZOLIN SODIUM-DEXTROSE 2-4 GM/100ML-% IV SOLN
2.0000 g | INTRAVENOUS | Status: AC
  Administered 2017-01-01: 2 g via INTRAVENOUS

## 2017-01-01 MED ORDER — BUPIVACAINE HCL (PF) 0.25 % IJ SOLN
INTRAMUSCULAR | Status: DC | PRN
  Administered 2017-01-01: 3 mL

## 2017-01-01 MED ORDER — PHENYLEPHRINE 40 MCG/ML (10ML) SYRINGE FOR IV PUSH (FOR BLOOD PRESSURE SUPPORT)
PREFILLED_SYRINGE | INTRAVENOUS | Status: AC
  Filled 2017-01-01: qty 10

## 2017-01-01 MED ORDER — PHENYLEPHRINE 40 MCG/ML (10ML) SYRINGE FOR IV PUSH (FOR BLOOD PRESSURE SUPPORT)
PREFILLED_SYRINGE | INTRAVENOUS | Status: DC | PRN
  Administered 2017-01-01: 120 ug via INTRAVENOUS
  Administered 2017-01-01: 80 ug via INTRAVENOUS

## 2017-01-01 SURGICAL SUPPLY — 45 items
BANDAGE ACE 3X5.8 VEL STRL LF (GAUZE/BANDAGES/DRESSINGS) IMPLANT
BNDG CMPR 9X4 STRL LF SNTH (GAUZE/BANDAGES/DRESSINGS) ×1
BNDG COHESIVE 1X5 TAN STRL LF (GAUZE/BANDAGES/DRESSINGS) ×3 IMPLANT
BNDG CONFORM 2 STRL LF (GAUZE/BANDAGES/DRESSINGS) IMPLANT
BNDG ELASTIC 2X5.8 VLCR STR LF (GAUZE/BANDAGES/DRESSINGS) ×3 IMPLANT
BNDG ESMARK 4X9 LF (GAUZE/BANDAGES/DRESSINGS) ×3 IMPLANT
BNDG GAUZE ELAST 4 BULKY (GAUZE/BANDAGES/DRESSINGS) IMPLANT
CLOSURE WOUND 1/2 X4 (GAUZE/BANDAGES/DRESSINGS)
CORDS BIPOLAR (ELECTRODE) ×3 IMPLANT
COVER SURGICAL LIGHT HANDLE (MISCELLANEOUS) ×3 IMPLANT
CUFF TOURNIQUET SINGLE 18IN (TOURNIQUET CUFF) IMPLANT
CUFF TOURNIQUET SINGLE 24IN (TOURNIQUET CUFF) IMPLANT
DRAPE OEC MINIVIEW 54X84 (DRAPES) IMPLANT
DRAPE SURG 17X23 STRL (DRAPES) ×3 IMPLANT
DURAPREP 26ML APPLICATOR (WOUND CARE) ×3 IMPLANT
GAUZE SPONGE 2X2 8PLY STRL LF (GAUZE/BANDAGES/DRESSINGS) IMPLANT
GAUZE SPONGE 4X4 12PLY STRL (GAUZE/BANDAGES/DRESSINGS) IMPLANT
GAUZE SPONGE 4X4 12PLY STRL LF (GAUZE/BANDAGES/DRESSINGS) ×3 IMPLANT
GAUZE XEROFORM 1X8 LF (GAUZE/BANDAGES/DRESSINGS) ×3 IMPLANT
GLOVE SURG SYN 8.0 (GLOVE) ×3 IMPLANT
GOWN STRL REUS W/ TWL LRG LVL3 (GOWN DISPOSABLE) ×1 IMPLANT
GOWN STRL REUS W/ TWL XL LVL3 (GOWN DISPOSABLE) ×1 IMPLANT
GOWN STRL REUS W/TWL LRG LVL3 (GOWN DISPOSABLE) ×3
GOWN STRL REUS W/TWL XL LVL3 (GOWN DISPOSABLE) ×3
KIT BASIN OR (CUSTOM PROCEDURE TRAY) ×3 IMPLANT
KIT ROOM TURNOVER OR (KITS) ×3 IMPLANT
MANIFOLD NEPTUNE II (INSTRUMENTS) ×3 IMPLANT
NEEDLE HYPO 25GX1X1/2 BEV (NEEDLE) IMPLANT
NS IRRIG 1000ML POUR BTL (IV SOLUTION) ×3 IMPLANT
PACK ORTHO EXTREMITY (CUSTOM PROCEDURE TRAY) ×3 IMPLANT
PAD ARMBOARD 7.5X6 YLW CONV (MISCELLANEOUS) ×6 IMPLANT
PAD CAST 3X4 CTTN HI CHSV (CAST SUPPLIES) IMPLANT
PADDING CAST COTTON 3X4 STRL (CAST SUPPLIES)
SPECIMEN JAR SMALL (MISCELLANEOUS) ×3 IMPLANT
SPONGE GAUZE 2X2 STER 10/PKG (GAUZE/BANDAGES/DRESSINGS)
STRIP CLOSURE SKIN 1/2X4 (GAUZE/BANDAGES/DRESSINGS) IMPLANT
SUCTION FRAZIER HANDLE 10FR (MISCELLANEOUS)
SUCTION TUBE FRAZIER 10FR DISP (MISCELLANEOUS) IMPLANT
SUT ETHILON 4 0 PS 2 18 (SUTURE) ×3 IMPLANT
TOWEL OR 17X24 6PK STRL BLUE (TOWEL DISPOSABLE) ×3 IMPLANT
TOWEL OR 17X26 10 PK STRL BLUE (TOWEL DISPOSABLE) ×3 IMPLANT
TUBE CONNECTING 12'X1/4 (SUCTIONS)
TUBE CONNECTING 12X1/4 (SUCTIONS) IMPLANT
UNDERPAD 30X30 (UNDERPADS AND DIAPERS) ×3 IMPLANT
WATER STERILE IRR 1000ML POUR (IV SOLUTION) ×3 IMPLANT

## 2017-01-01 NOTE — Anesthesia Procedure Notes (Signed)
Procedure Name: LMA Insertion Date/Time: 01/01/2017 2:07 PM Performed by: Jed Limerick Pre-anesthesia Checklist: Patient identified, Emergency Drugs available, Suction available and Patient being monitored Patient Re-evaluated:Patient Re-evaluated prior to induction Oxygen Delivery Method: Circle System Utilized Preoxygenation: Pre-oxygenation with 100% oxygen Induction Type: IV induction Ventilation: Mask ventilation without difficulty LMA: LMA inserted LMA Size: 4.0 Number of attempts: 1 Placement Confirmation: positive ETCO2 Tube secured with: Tape Dental Injury: Teeth and Oropharynx as per pre-operative assessment

## 2017-01-01 NOTE — Op Note (Signed)
Please see operative report #[id number]

## 2017-01-01 NOTE — Anesthesia Postprocedure Evaluation (Signed)
Anesthesia Post Note  Patient: Charles Vasquez  Procedure(s) Performed: REVISION AMPUTATION RIGHT INDEX FINGER (Right )     Patient location during evaluation: PACU Anesthesia Type: General Level of consciousness: awake and alert Pain management: pain level controlled Vital Signs Assessment: post-procedure vital signs reviewed and stable Respiratory status: spontaneous breathing, nonlabored ventilation and respiratory function stable Cardiovascular status: blood pressure returned to baseline and stable Postop Assessment: no apparent nausea or vomiting Anesthetic complications: no    Last Vitals:  Vitals:   01/01/17 1518 01/01/17 1528  BP: (!) 167/70 (!) 163/58  Pulse: (!) 59 (!) 59  Resp: 14 12  Temp:  (!) 36.2 C  SpO2: 100% 100%    Last Pain:  Vitals:   01/01/17 1528  TempSrc:   PainSc: 0-No pain                 Cecile Hearing

## 2017-01-01 NOTE — Transfer of Care (Signed)
Immediate Anesthesia Transfer of Care Note  Patient: Charles Vasquez  Procedure(s) Performed: REVISION AMPUTATION RIGHT INDEX FINGER (Right )  Patient Location: PACU  Anesthesia Type:General  Level of Consciousness: awake, alert  and oriented  Airway & Oxygen Therapy: Patient Spontanous Breathing and Patient connected to nasal cannula oxygen  Post-op Assessment: Report given to RN and Post -op Vital signs reviewed and stable  Post vital signs: Reviewed and stable  Last Vitals:  Vitals:   01/01/17 1331 01/01/17 1458  BP: (!) 195/61 (!) 165/54  Pulse:  (!) 59  Resp:  14  Temp:  36.4 C  SpO2:  100%    Last Pain:  Vitals:   01/01/17 1458  TempSrc:   PainSc: Asleep      Patients Stated Pain Goal: 3 (01/01/17 1301)  Complications: No apparent anesthesia complications

## 2017-01-01 NOTE — H&P (Signed)
Charles Vasquez is an 81 y.o. male.   Chief Complaint: Right index finger pain, swelling, and drainage. HPI: Patient's a very pleasant 81 year old male with chronic renal insufficiency on dialysis 3 days a week with right index finger ischemic changes and a chronic draining wound.  Past Medical History:  Diagnosis Date  . Diabetes mellitus without complication (HCC)    lantus daily, type2  . Dizziness   . ESRD (end stage renal disease) on dialysis (HCC)    "[facility]; MWF" (10/22/2016)  . History of blood transfusion   . Hypertension   . Myocardial infarction (HCC)   . Pneumonia    last yr  . Stroke [facility])     Past Surgical History:  Procedure Laterality Date  . ARTERIOVENOUS GRAFT PLACEMENT    . ARTERIOVENOUS GRAFT PLACEMENT Left 10/22/2016   thigh  . AV FISTULA PLACEMENT  03/31/2012   Procedure: ARTERIOVENOUS (AV) FISTULA CREATION;  Surgeon: [name], MD;  Location: [facility] OR;  Service: Vascular;  Laterality: Right;  First stage Brachial vein transposition  . AV FISTULA PLACEMENT Right 08/25/2012   Procedure: INSERTION OF ARTERIOVENOUS (AV) GORE-TEX GRAFT ARM;  Surgeon: [name], MD;  Location: [facility] OR;  Service: Vascular;  Laterality: Right;  . AV FISTULA PLACEMENT Left 07/23/2016   Procedure: INSERTION OF ARTERIOVENOUS (AV) GORE-TEX GRAFT LEFT UPPER ARM;  Surgeon: [name], MD;  Location: [facility] OR;  Service: Vascular;  Laterality: Left;  . AV FISTULA PLACEMENT Left 10/22/2016   Procedure: INSERTION OF 4-44mm x 45cm  ARTERIOVENOUS (AV) GORE-TEX GRAFT THIGH-LEFT;  Surgeon: [name], MD;  Location[facility] OR;  Service: Vascular;  Laterality: Left;  . EYE SURGERY Bilateral    cataracts  . INSERTION OF DIALYSIS CATHETER Left 05/05/2012   Procedure: INSERTION OF DIALYSIS CATHETER;  Surgeon: Chuck Hint, MD;  Location: [facility] OR;  Service: Vascular;  Laterality: Left;  . INSERTION OF DIALYSIS CATHETER Right 10/01/2016   Procedure: INSERTION OF DIALYSIS CATHETER- RIGHT FEMORAL;   Surgeon: [name], MD;  Location: [facility] OR;  Service: Vascular;  Laterality: Right;  . IR FLUORO GUIDE CV LINE RIGHT  06/10/2016  . IR GENERIC HISTORICAL  06/07/2016   IR US GUIDE VASC ACCESS RIGHT 06/07/2016 Oley Balm, MD [facility]-INTERV RAD  . IR THROMBECTOMY AV FISTULA W/THROMBOLYSIS/PTA INC/SHUNT/IMG RIGHT Right 06/07/2016  . IR US GUIDE VASC ACCESS RIGHT  06/10/2016  . LIGATION ARTERIOVENOUS GORTEX GRAFT Left 08/04/2016   Procedure: LIGATION ARTERIOVENOUS GORTEX GRAFT;  Surgeon: Larina Earthly, MD;  Location: [facility] OR;  Service: Vascular;  Laterality: Left;  . LIGATION ARTERIOVENOUS GORTEX GRAFT Right 11/26/2016   Procedure: LIGATION OF RIGHT UPPER ARM ARTERIOVENOUS GORTEX GRAFT;  Surgeon: [name], MD;  Location: [facility] OR;  Service: Vascular;  Laterality: Right;  . LIGATION OF ARTERIOVENOUS  FISTULA Right 08/25/2012   Procedure: LIGATION OF ARTERIOVENOUS  FISTULA;  Surgeon: [name], MD;  Location[facility] OR;  Service: Vascular;  Laterality: Right;  Ligation of right brachial vein transposition  . REMOVAL OF A DIALYSIS CATHETER Right 05/05/2012   Procedure: REMOVAL OF A DIALYSIS CATHETER;  Surgeon: Chuck Hint, MD;  Location: [facility] OR;  Service: Vascular;  Laterality: Right;  . REMOVAL OF A DIALYSIS CATHETER Right 10/01/2016   Procedure: REMOVAL OF A DIALYSIS CATHETER-RIGHT UPPER CHEST;  Surgeon: [name], MD;  Location: [facility] OR;  Service: Vascular;  Laterality: Right;  . REMOVAL OF A DIALYSIS CATHETER Right 11/26/2016   Procedure: REMOVAL OF RIGHT  FEMORAL TUNNEL DIALYSIS CATHETER;  Surgeon: Fransisco Hertzhen, Brian L, MD;  [facility];  Service: Vascular;  Laterality: Right;  . TOE AMPUTATION    . UPPER EXTREMITY VENOGRAPHY Bilateral 07/18/2016   Procedure: Bilateral Upper Extremity Venography;  Surgeon: Fransisco Hertzhen, Brian L, MD;  [facility];  Service: Cardiovascular;  Laterality: Bilateral;    Family History  Problem Relation Age of Onset  . Diabetes Mother   . Hypertension Mother   .  Diabetes Father   . Hypertension Father    Social History:  reports that he has never smoked. He has never used smokeless tobacco. He reports that he does not drink alcohol or use drugs.  Allergies:  Allergies  Allergen Reactions  . Lisinopril Other (See Comments)    HYPERKALEMIA RENAL DYSFUNCTION PROGRESSION    Medications Prior to Admission  Medication Sig Dispense Refill  . aspirin EC 81 MG tablet Take 81 mg by mouth daily.    . bacitracin ophthalmic ointment Place 1 application into the left eye at bedtime. apply to eye    . clopidogrel (PLAVIX) 75 MG tablet Take 75 mg by mouth at bedtime.     . insulin glargine (LANTUS) 100 UNIT/ML injection Inject 5 Units into the skin at bedtime.     Marland Kitchen. latanoprost (XALATAN) 0.005 % ophthalmic solution Place 1 drop into the left eye at bedtime.    Marland Kitchen. loperamide (IMODIUM) 2 MG capsule Take 1 capsule (2 mg total) by mouth 4 (four) times daily as needed for diarrhea or loose stools. 12 capsule 0  . losartan (COZAAR) 100 MG tablet Take 100 mg by mouth daily.    . multivitamin (RENA-VIT) TABS tablet Take 1 tablet by mouth daily.    Marland Kitchen. oxyCODONE-acetaminophen (ROXICET) 5-325 MG tablet Take 1-2 tablets by mouth every 6 (six) hours as needed for moderate pain. 15 tablet 0  . Polyvinyl Alcohol-Povidone (REFRESH OP) Place 1 drop into both eyes 2 (two) times daily.    . valACYclovir (VALTREX) 1000 MG tablet Take 1,000 mg by mouth daily.     . Darbepoetin Alfa (ARANESP) 60 MCG/0.3ML SOSY injection Inject 0.3 mLs (60 mcg total) into the vein every Friday with hemodialysis. (Patient not taking: Reported on 12/19/2016) 4.2 mL   . ondansetron (ZOFRAN) 4 MG tablet Take 1 tablet (4 mg total) by mouth every 6 (six) hours. 12 tablet 0    Results for orders placed or performed during the hospital encounter of 01/01/17 (from the past 48 hour(s))  Glucose, capillary     Status: Abnormal   Collection Time: 01/01/17  1:01 PM  Result Value Ref Range   Glucose-Capillary  59 (L) 65 - 99 mg/dL  I-STAT 4, (NA,K, GLUC, HGB,HCT)     Status: Abnormal   Collection Time: 01/01/17  1:24 PM  Result Value Ref Range   Sodium 137 135 - 145 mmol/L   Potassium 3.6 3.5 - 5.1 mmol/L   Glucose, Bld 67 65 - 99 mg/dL   HCT 16.134.0 (L) 09.639.0 - 04.552.0 %   Hemoglobin 11.6 (L) 13.0 - 17.0 g/dL   No results found.  Review of Systems  All other systems reviewed and are negative.   Blood pressure (!) 195/61, pulse (P) 72, temperature (P) 97.6 F (36.4 C), temperature source (P) Oral, resp. rate (P) 20, height 5\' 7"  (1.702 m), weight 63.5 kg (140 lb), SpO2 (P) 100 %. Physical Exam  Constitutional: He is oriented to person, place, and time. He appears well-developed and well-nourished.  HENT:  Head: Normocephalic and atraumatic.  Neck: Normal range of motion.  Cardiovascular: Normal rate.   Respiratory: Effort normal.  Musculoskeletal:       Right hand: He exhibits tenderness and swelling.  Chronic right index finger ischemic changes with drainage and pain  Neurological: He is alert and oriented to person, place, and time.  Skin: Skin is warm.  Psychiatric: He has a normal mood and affect. His behavior is normal. Judgment and thought content normal.     Assessment/Plan Have discussed with the patient and his wife the nature of his current predicament and treatment options. We will proceed with revision amputation right index finger with primary closure as an outpatient today.  Marlowe Shores, MD 01/01/2017, 1:39 PM

## 2017-01-01 NOTE — Progress Notes (Signed)
Dr Malen Gauze informed of CBG of 59 new orders noted

## 2017-01-01 NOTE — Anesthesia Preprocedure Evaluation (Signed)
Anesthesia Evaluation  Patient identified by MRN, date of birth, ID band Patient awake    Reviewed: Allergy & Precautions, H&P , NPO status , Patient's Chart, lab work & pertinent test results  Airway Mallampati: II  TM Distance: >3 FB Neck ROM: full    Dental  (+) Edentulous Lower, Edentulous Upper   Pulmonary neg pulmonary ROS,    Pulmonary exam normal breath sounds clear to auscultation       Cardiovascular hypertension, Pt. on medications + Past MI  Normal cardiovascular exam Rhythm:Regular Rate:Normal     Neuro/Psych CVA, No Residual Symptoms negative psych ROS   GI/Hepatic GERD  ,  Endo/Other  diabetes, Type 2, Insulin Dependent  Renal/GU ESRF and DialysisRenal disease (MWF)K+ 3.6     Musculoskeletal  (+) Arthritis ,   Abdominal   Peds  Hematology  (+) Blood dyscrasia, anemia ,   Anesthesia Other Findings   Reproductive/Obstetrics                             Anesthesia Physical  Anesthesia Plan  ASA: IV  Anesthesia Plan: General   Post-op Pain Management:    Induction: Intravenous  PONV Risk Score and Plan: 2 and Ondansetron and Dexamethasone  Airway Management Planned: Simple Face Mask  Additional Equipment:   Intra-op Plan:   Post-operative Plan:   Informed Consent: I have reviewed the patients History and Physical, chart, labs and discussed the procedure including the risks, benefits and alternatives for the proposed anesthesia with the patient or authorized representative who has indicated his/her understanding and acceptance.     Plan Discussed with: CRNA, Anesthesiologist and Surgeon  Anesthesia Plan Comments:         Anesthesia Quick Evaluation

## 2017-01-02 ENCOUNTER — Encounter (HOSPITAL_COMMUNITY): Payer: Self-pay | Admitting: Orthopedic Surgery

## 2017-01-02 NOTE — Op Note (Signed)
NAME:  Charles Vasquez, Charles Vasquez                     ACCOUNT NO.:  MEDICAL RECORD NO.:  [id number]  LOCATION:                                 FACILITY:  PHYSICIAN:  Artist Pais. 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  PROCEDURE PERFORMED:  Right index finger middle phalangeal level amputation with volar advancement flap closure.  SURGEON:  Artist Pais. Mina Marble, M.D.  ASSISTANT:  None.  ANESTHESIA:  General.  COMPLICATIONS:  No complications.  DRAINS:  No drains.  SPECIMENS:  1 specimen sent.  DESCRIPTION OF PROCEDURE:  The patient was taken to the operating suite. After the induction of adequate general anesthetic, right upper extremity was prepped and draped in sterile fashion.  An Esmarch was used to exsanguinate the limb.  Tourniquet was inflated to 225 mmHg.  At this point in time, a fishmouth incision was made over the middle phalanx of the index finger on the right were chronic ischemic tip with chronic drainage was seen.  We elevated a dorsal and volar flap.  The volar flap being longer and dissected down dorsally to the extensor mechanism and the DIP joint capsule which was incised.  We then supinated the hand, identified the neurovascular bundles.  We transected them distally.  We removed the chronic ischemic part at the DIP joint. We then used a bone cutter to shorten the middle phalanx down to bleeding bone.  We performed bilateral neurectomies and then thoroughly irrigated and loosely closed with 4-0 nylon.  Xeroform, 4x4s, and a compression wrap was applied.  The patient tolerated this procedure well in stable fashion.    Artist Pais Mina Marble, M.D.    MAW/MEDQ  D:  01/01/2017  T:  01/01/2017  Job:   [id number]

## 2017-01-18 NOTE — Progress Notes (Signed)
    Postoperative Access Visit   History of Present Illness   Charles Vasquez is a 81 y.o. year old male who presents for postoperative follow-up for: ligation of right upper arm arteriovenous graft, removal R fem TDC (Date: 11/26/16).  Pt is using a L thigh AVG for HD.  The patient under went distal phalangeal amputation of R 2nd finger.  He is healing up this wound reportedly.  The patient is using L thigh AVG without difficulties reportedly.    Physical Examination   Vitals:   01/20/17 1336 01/20/17 1340  BP: (!) 190/77 (!) 187/79  Pulse: 78   Resp: 20   Temp: (!) 97.5 F (36.4 C)   TempSrc: Oral   SpO2: 100%   Weight: 138 lb 1.6 oz (62.6 kg)   Height: 5\' 7"  (1.702 m)     right arm Incision is healed, skin feels warm, hand grip not tested due to amputation, no thrill or bruit, R 2nd finger bandaged    Medical Decision Making   Charles Vasquez is a 81 y.o. year old male who presents s/p ligationof right upper arm arteriovenous graft, ESRD-HD using L thigh AVG   The patient's L thigh AVG is being used.  Reportedly R 2nd distal phalange amputation is healing.  F/U as needed  Thank you for allowing [place] to participate in this patient's care.   [name], MD, FACS [facility] Office: [phone] Pager: [phone]

## 2017-01-20 ENCOUNTER — Ambulatory Visit (INDEPENDENT_AMBULATORY_CARE_PROVIDER_SITE_OTHER): Payer: Medicare Other | Admitting: Vascular Surgery

## 2017-01-20 ENCOUNTER — Encounter: Payer: Self-pay | Admitting: Vascular Surgery

## 2017-01-20 VITALS — BP 187/79 | HR 78 | Temp 97.5°F | Resp 20 | Ht 67.0 in | Wt 138.1 lb

## 2017-01-20 DIAGNOSIS — N186 End stage renal disease: Secondary | ICD-10-CM

## 2017-01-20 DIAGNOSIS — Z992 Dependence on renal dialysis: Secondary | ICD-10-CM

## 2017-01-26 ENCOUNTER — Emergency Department (HOSPITAL_COMMUNITY): Payer: Medicare Other

## 2017-01-26 ENCOUNTER — Encounter (HOSPITAL_COMMUNITY): Payer: Self-pay | Admitting: Emergency Medicine

## 2017-01-26 ENCOUNTER — Other Ambulatory Visit: Payer: Self-pay

## 2017-01-26 ENCOUNTER — Emergency Department (HOSPITAL_COMMUNITY)
Admission: EM | Admit: 2017-01-26 | Discharge: 2017-01-26 | Disposition: A | Discharge status: Home / Self Care | Payer: Medicare Other | Attending: Emergency Medicine | Admitting: Emergency Medicine

## 2017-01-26 DIAGNOSIS — E1122 Type 2 diabetes mellitus with diabetic chronic kidney disease: Secondary | ICD-10-CM | POA: Insufficient documentation

## 2017-01-26 DIAGNOSIS — Z992 Dependence on renal dialysis: Secondary | ICD-10-CM | POA: Insufficient documentation

## 2017-01-26 DIAGNOSIS — Z7982 Long term (current) use of aspirin: Secondary | ICD-10-CM | POA: Diagnosis not present

## 2017-01-26 DIAGNOSIS — I120 Hypertensive chronic kidney disease with stage 5 chronic kidney disease or end stage renal disease: Secondary | ICD-10-CM | POA: Insufficient documentation

## 2017-01-26 DIAGNOSIS — R197 Diarrhea, unspecified: Secondary | ICD-10-CM | POA: Diagnosis not present

## 2017-01-26 DIAGNOSIS — R109 Unspecified abdominal pain: Secondary | ICD-10-CM | POA: Diagnosis not present

## 2017-01-26 DIAGNOSIS — N186 End stage renal disease: Secondary | ICD-10-CM | POA: Insufficient documentation

## 2017-01-26 DIAGNOSIS — Z79899 Other long term (current) drug therapy: Secondary | ICD-10-CM | POA: Diagnosis not present

## 2017-01-26 LAB — COMPREHENSIVE METABOLIC PANEL
ALT: 18 U/L (ref 17–63)
AST: 23 U/L (ref 15–41)
Albumin: 3 g/dL — ABNORMAL LOW (ref 3.5–5.0)
Alkaline Phosphatase: 68 U/L (ref 38–126)
Anion gap: 13 (ref 5–15)
BILIRUBIN TOTAL: 0.5 mg/dL (ref 0.3–1.2)
BUN: 55 mg/dL — ABNORMAL HIGH (ref 6–20)
CHLORIDE: 106 mmol/L (ref 101–111)
CO2: 19 mmol/L — ABNORMAL LOW (ref 22–32)
Calcium: 7.1 mg/dL — ABNORMAL LOW (ref 8.9–10.3)
Creatinine, Ser: 7.54 mg/dL — ABNORMAL HIGH (ref 0.61–1.24)
GFR, EST AFRICAN AMERICAN: 7 mL/min — AB (ref >60)
GFR, EST NON AFRICAN AMERICAN: 6 mL/min — AB (ref >60)
Glucose, Bld: 192 mg/dL — ABNORMAL HIGH (ref 65–99)
Potassium: 3.2 mmol/L — ABNORMAL LOW (ref 3.5–5.1)
Sodium: 138 mmol/L (ref 135–145)
TOTAL PROTEIN: 6.2 g/dL — AB (ref 6.5–8.1)

## 2017-01-26 LAB — CBC WITH DIFFERENTIAL/PLATELET
Basophils Absolute: 0 10*3/uL (ref 0.0–0.1)
Basophils Relative: 0 %
EOS PCT: 0 %
Eosinophils Absolute: 0 10*3/uL (ref 0.0–0.7)
HEMATOCRIT: 37.3 % — AB (ref 39.0–52.0)
Hemoglobin: 12.3 g/dL — ABNORMAL LOW (ref 13.0–17.0)
LYMPHS ABS: 0.8 10*3/uL (ref 0.7–4.0)
LYMPHS PCT: 8 %
MCH: 33 pg (ref 26.0–34.0)
MCHC: 33 g/dL (ref 30.0–36.0)
MCV: 100 fL (ref 78.0–100.0)
MONO ABS: 0.9 10*3/uL (ref 0.1–1.0)
Monocytes Relative: 8 %
Neutro Abs: 8.7 10*3/uL — ABNORMAL HIGH (ref 1.7–7.7)
Neutrophils Relative %: 83 %
PLATELETS: 115 10*3/uL — AB (ref 150–400)
RBC: 3.73 MIL/uL — AB (ref 4.22–5.81)
RDW: 15.9 % — AB (ref 11.5–15.5)
WBC: 10.5 10*3/uL (ref 4.0–10.5)

## 2017-01-26 LAB — LIPASE, BLOOD: LIPASE: 19 U/L (ref 11–51)

## 2017-01-26 NOTE — ED Notes (Signed)
Pt back from CT

## 2017-01-26 NOTE — ED Notes (Signed)
MD at bedside. 

## 2017-01-26 NOTE — Discharge Instructions (Signed)
Follow-up with Dr. Kendell Bane in 1-2 weeks.  Use Imodium as needed for diarrhea

## 2017-01-26 NOTE — ED Triage Notes (Signed)
Patient c/o left lower abd pain that started this morning. Patient reports intermittent diarrhea x1 month in which he has been seen for. Patient given medication per family but unsure of name. Family states "It slowed it down for about a week but it came right back." Unsure if he was diagnosed with C-diff but states he has been taking an antibiotic. Patient reports he vomited twice Thursday but none since. Denies any fevers.

## 2017-01-26 NOTE — ED Provider Notes (Signed)
[facility] EMERGENCY DEPARTMENT Provider Note   CSN: [id number] Arrival date & time: 01/26/17  1203     History   Chief Complaint Chief Complaint  Patient presents with  . Abdominal Pain    HPI Charles Vasquez is a 81 y.o. male.  Patient complains of diarrhea.  He has been having some diarrhea for last couple weeks off and on no fevers no chills no abdominal pain.  Patient had 3 bouts of diarrhea without blood today   The history is provided by the patient. No language interpreter was used.  Diarrhea   This is a new problem. The current episode started more than 1 week ago. The problem occurs 2 to 4 times per day. The problem has not changed since onset.The stool consistency is described as watery. There has been no fever. The fever has been present for less than 1 day. Pertinent negatives include no abdominal pain, no chills, no headaches and no cough. He has tried nothing for the symptoms.    Past Medical History:  Diagnosis Date  . Diabetes mellitus without complication (HCC)    lantus daily, type2  . Dizziness   . ESRD (end stage renal disease) on dialysis (HCC)    "[facility]; MWF" (10/22/2016)  . History of blood transfusion   . Hypertension   . Myocardial infarction (HCC)   . Pneumonia    last yr  . Stroke [facility])     Patient Active Problem List   Diagnosis Date Noted  . ESRD (end stage renal disease) on dialysis (HCC) 10/22/2016  . Venous hypertension status post AVG procedure 08/10/2016  . Anasarca 08/04/2016  . Facial swelling 08/04/2016  . Effusion, other site 08/04/2016  . 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 Acute respiratory failure (HCC) 08/04/2016  . Hyperkalemia 08/04/2016  . Aftercare following surgery of the circulatory system, NEC-Right  AVGG 09/25/2012  . Other complications due to renal dialysis device, implant, and graft 05/01/2012  . End stage renal disease (HCC) 03/17/2012  . FATIGUE, ACUTE 08/22/2008  . HEMATURIA UNSPECIFIED 07/11/2008  . Diabetes  (HCC) 01/19/2008  . Essential hypertension 01/19/2008  . MYOCARDIAL INFARCTION, HX OF 01/19/2008  . GERD 01/19/2008  . OSTEOARTHRITIS 01/19/2008  . CEREBROVASCULAR ACCIDENT, HX OF 01/19/2008    Past Surgical History:  Procedure Laterality Date  . ARTERIOVENOUS (AV) FISTULA CREATION Right 03/31/2012   Performed by [name], MD at [facility] OR  . ARTERIOVENOUS GRAFT PLACEMENT    . ARTERIOVENOUS GRAFT PLACEMENT Left 10/22/2016   thigh  . Bilateral Upper Extremity Venography Bilateral 07/18/2016   Performed by [name], MD at [facility] INVASIVE CV LAB  . EYE SURGERY Bilateral    cataracts  . INSERTION OF 4-33mm x 45cm  ARTERIOVENOUS (AV) GORE-TEX GRAFT THIGH-LEFT Left 10/22/2016   Performed by [name], MD at [facility] OR  . INSERTION OF ARTERIOVENOUS (AV) GORE-TEX GRAFT ARM Right 08/25/2012   Performed by [name], MD at [facility] OR  . INSERTION OF ARTERIOVENOUS (AV) GORE-TEX GRAFT LEFT UPPER ARM Left 07/23/2016   Performed by [name], MD at [facility] OR  . INSERTION OF DIALYSIS CATHETER Left 05/05/2012   Performed by Chuck Hint, MD at [facility] OR  . INSERTION OF DIALYSIS CATHETER- RIGHT FEMORAL Right 10/01/2016   Performed by [name], MD at [facility] OR  . IR FLUORO GUIDE CV LINE RIGHT  06/10/2016  . IR GENERIC HISTORICAL  06/07/2016   IR US GUIDE VASC ACCESS RIGHT 06/07/2016 Oley Balm,  MD [facility]-INTERV RAD  . IR THROMBECTOMY AV FISTULA W/THROMBOLYSIS/PTA INC/SHUNT/IMG RIGHT Right 06/07/2016  . IR US GUIDE VASC ACCESS RIGHT  06/10/2016  . LIGATION ARTERIOVENOUS GORTEX GRAFT Left 08/04/2016   Performed by Larina Earthly, MD at [facility] OR  . LIGATION OF ARTERIOVENOUS  FISTULA Right 08/25/2012   Performed by [name], MD at [facility] OR  . LIGATION OF RIGHT UPPER ARM ARTERIOVENOUS GORTEX GRAFT Right 11/26/2016   Performed by [name], MD at [facility] OR  . REMOVAL OF A DIALYSIS CATHETER Right 05/05/2012   Performed by Chuck Hint, MD at [facility] OR  . REMOVAL OF A DIALYSIS CATHETER-RIGHT UPPER CHEST Right  10/01/2016   Performed by [name], MD at [facility] OR  . REMOVAL OF RIGHT FEMORAL TUNNEL DIALYSIS CATHETER Right 11/26/2016   Performed by [name], MD at [facility] OR  . REVISION AMPUTATION RIGHT INDEX FINGER Right 01/01/2017   Performed by Dairl Ponder, MD at [facility] OR  . TOE AMPUTATION         Home Medications    Prior to Admission medications   Medication Sig Start Date End Date Taking? Authorizing Provider  aspirin EC 81 MG tablet Take 81 mg by mouth daily.    [provider]  bacitracin ophthalmic ointment Place 1 application into the left eye at bedtime. apply to eye    [provider]  clopidogrel (PLAVIX) 75 MG tablet Take 75 mg by mouth at bedtime.  11/20/16   [provider]  Darbepoetin Alfa (ARANESP) 60 MCG/0.3ML SOSY injection Inject 0.3 mLs (60 mcg total) into the vein every Friday with hemodialysis. Patient not taking: Reported on 12/19/2016 08/16/16   Rama, Maryruth Bun, MD  insulin glargine (LANTUS) 100 UNIT/ML injection Inject 5 Units into the skin at bedtime.     [provider]  latanoprost (XALATAN) 0.005 % ophthalmic solution Place 1 drop into the left eye at bedtime. 10/15/16   [provider]  loperamide (IMODIUM) 2 MG capsule Take 1 capsule (2 mg total) by mouth 4 (four) times daily as needed for diarrhea or loose stools. 12/24/16   Linwood Dibbles, MD  losartan (COZAAR) 100 MG tablet Take 100 mg by mouth daily.    [provider]  multivitamin (RENA-VIT) TABS tablet Take 1 tablet by mouth daily.    [provider]  ondansetron (ZOFRAN) 4 MG tablet Take 1 tablet (4 mg total) by mouth every 6 (six) hours. 12/24/16   Linwood Dibbles, MD  oxyCODONE-acetaminophen (ROXICET) 5-325 MG tablet Take 1-2 tablets by mouth every 6 (six) hours as needed for moderate pain. 11/26/16 11/26/17  [name], MD  oxyCODONE-acetaminophen (ROXICET) 5-325 MG tablet Take 1 tablet by mouth every 4 (four) hours as needed for severe pain. 01/01/17    Dairl Ponder, MD  Polyvinyl Alcohol-Povidone (REFRESH OP) Place 1 drop into both eyes 2 (two) times daily.    [provider]  valACYclovir (VALTREX) 1000 MG tablet Take 1,000 mg by mouth daily.     [provider]    Family History Family History  Problem Relation Age of Onset  . Diabetes Mother   . Hypertension Mother   . Diabetes Father   . Hypertension Father     Social History Social History   Tobacco Use  . Smoking status: Never Smoker  . Smokeless tobacco: Never Used  Substance Use Topics  . Alcohol use: No  . Drug use: No     Allergies  Lisinopril   Review of Systems Review of Systems  Constitutional: Negative for appetite change, chills and fatigue.  HENT: Negative for congestion, ear discharge and sinus pressure.   Eyes: Negative for discharge.  Respiratory: Negative for cough.   Cardiovascular: Negative for chest pain.  Gastrointestinal: Positive for diarrhea. Negative for abdominal pain.  Genitourinary: Negative for frequency and hematuria.  Musculoskeletal: Negative for back pain.  Skin: Negative for rash.  Neurological: Negative for seizures and headaches.  Psychiatric/Behavioral: Negative for hallucinations.     Physical Exam Updated Vital Signs BP (!) 170/62 (BP Location: Left Arm)   Pulse 77   Temp 97.7 F (36.5 C) (Oral)   Resp 16   Ht 5\' 7"  (1.702 m)   Wt 62.6 kg (138 lb)   SpO2 100%   BMI 21.61 kg/m   Physical Exam  Constitutional: He is oriented to person, place, and time. He appears well-developed.  HENT:  Head: Normocephalic.  Eyes: Conjunctivae and EOM are normal. No scleral icterus.  Neck: Neck supple. No thyromegaly present.  Cardiovascular: Normal rate and regular rhythm. Exam reveals no gallop and no friction rub.  No murmur heard. Pulmonary/Chest: No stridor. He has no wheezes. He has no rales. He exhibits no tenderness.  Abdominal: He exhibits no distension. There is no tenderness. There is no  rebound.  Musculoskeletal: Normal range of motion. He exhibits no edema.  Lymphadenopathy:    He has no cervical adenopathy.  Neurological: He is oriented to person, place, and time. He exhibits normal muscle tone. Coordination normal.  Skin: No rash noted. No erythema.  Psychiatric: He has a normal mood and affect. His behavior is normal.     ED Treatments / Results  Labs (all labs ordered are listed, but only abnormal results are displayed) Labs Reviewed  CBC WITH DIFFERENTIAL/PLATELET - Abnormal; Notable for the following components:      Result Value   RBC 3.73 (*)    Hemoglobin 12.3 (*)    HCT 37.3 (*)    RDW 15.9 (*)    Platelets 115 (*)    Neutro Abs 8.7 (*)    All other components within normal limits  COMPREHENSIVE METABOLIC PANEL - Abnormal; Notable for the following components:   Potassium 3.2 (*)    CO2 19 (*)    Glucose, Bld 192 (*)    BUN 55 (*)    Creatinine, Ser 7.54 (*)    Calcium 7.1 (*)    Total Protein 6.2 (*)    Albumin 3.0 (*)    GFR calc non Af Amer 6 (*)    GFR calc Af Amer 7 (*)    All other components within normal limits  LIPASE, BLOOD    EKG  EKG Interpretation None       Radiology Ct Abdomen Pelvis Wo Contrast  Result Date: 01/26/2017 CLINICAL DATA:  Left-sided abdominal pain for several hours EXAM: CT ABDOMEN AND PELVIS WITHOUT CONTRAST TECHNIQUE: Multidetector CT imaging of the abdomen and pelvis was performed following the standard protocol without IV contrast. COMPARISON:  12/23/2016 FINDINGS: Lower chest: No acute abnormality. Hepatobiliary: A few tiny dependent gallstones are noted within the gallbladder. The liver is within normal limits. Pancreas: Pancreas is somewhat atrophic but stable from the prior exam. Spleen: Normal in size without focal abnormality. Adrenals/Urinary Tract: The adrenal glands are unremarkable. Diffuse renal vascular calcifications are seen. No renal calculi or obstructive changes are noted. The bladder is  well distended. Stomach/Bowel: No obstructive or inflammatory changes are identified.  The appendix is not well visualized. There is again noted some wall thickening and caliber change in the left upper quadrant at the level of the splenic flexure. This is again suspicious for an underlying lesion and colonoscopy is recommended for further evaluation. Some mild wall thickening is noted in the distal aspect of the colon which may be related to underlying colitis given the clinical history. Vascular/Lymphatic: Aortic atherosclerosis. No enlarged abdominal or pelvic lymph nodes. Reproductive: Prostate is unremarkable. Other: No abdominal wall hernia or abnormality. No abdominopelvic ascites. Musculoskeletal: Degenerative changes of the lumbar spine are noted. IMPRESSION: Persistent wall thickening and narrowing in the region of the splenic flexure. Again, neoplasm cannot be excluded and further evaluation by means of colonoscopy is recommended. Question distal colitis. Tiny dependent gallstones without complicating factors. Chronic changes as described above. Electronically Signed   By: Alcide Clever M.D.   On: 01/26/2017 14:30    Procedures Procedures (including critical care time)  Medications Ordered in ED Medications - No data to display   Initial Impression / Assessment and Plan / ED Course  I have reviewed the triage vital signs and the nursing notes.  Pertinent labs & imaging results that were available during my care of the patient were reviewed by me and considered in my medical decision making (see chart for details).   Patient with chronic diarrhea.  CT scan showed questionable mass at splenic flexure.  Patient will be referred to GI for further workup and use Imodium for diarrhea    Final Clinical Impressions(s) / ED Diagnoses   Final diagnoses:  Diarrhea, unspecified type    ED Discharge Orders    None       Bethann Berkshire, MD 01/26/17 1445

## 2017-01-26 NOTE — ED Notes (Signed)
Pt to CT

## 2017-01-28 ENCOUNTER — Encounter: Payer: Self-pay | Admitting: Gastroenterology

## 2017-02-05 ENCOUNTER — Ambulatory Visit: Payer: Medicare Other | Admitting: Gastroenterology

## 2017-02-25 ENCOUNTER — Ambulatory Visit: Payer: Medicare Other | Admitting: Gastroenterology

## 2017-03-13 ENCOUNTER — Telehealth (INDEPENDENT_AMBULATORY_CARE_PROVIDER_SITE_OTHER): Payer: Self-pay | Admitting: Internal Medicine

## 2017-03-13 NOTE — Telephone Encounter (Signed)
We received a referral from Dr. Bartholomew Crews office on 03/10/17 for this patient to come see Charles Vasquez.  I was trying to get him scheduled today and saw that he already has an appointment with [facility] on 04/22/17.  I called Dr. Bartholomew Crews office to let them know that he is already scheduled with [facility], therefore I could not schedule him an appointment here also.

## 2017-03-18 ENCOUNTER — Encounter: Payer: Self-pay | Admitting: Gastroenterology

## 2017-03-18 ENCOUNTER — Ambulatory Visit: Payer: Medicare Other | Admitting: Gastroenterology

## 2017-03-18 ENCOUNTER — Other Ambulatory Visit: Payer: Self-pay

## 2017-03-18 ENCOUNTER — Telehealth: Payer: Self-pay

## 2017-03-18 VITALS — BP 102/59 | HR 73 | Temp 97.0°F | Ht 67.0 in | Wt 128.6 lb

## 2017-03-18 DIAGNOSIS — R109 Unspecified abdominal pain: Secondary | ICD-10-CM

## 2017-03-18 DIAGNOSIS — R197 Diarrhea, unspecified: Secondary | ICD-10-CM

## 2017-03-18 DIAGNOSIS — R634 Abnormal weight loss: Secondary | ICD-10-CM

## 2017-03-18 DIAGNOSIS — K219 Gastro-esophageal reflux disease without esophagitis: Secondary | ICD-10-CM

## 2017-03-18 DIAGNOSIS — R933 Abnormal findings on diagnostic imaging of other parts of digestive tract: Secondary | ICD-10-CM | POA: Diagnosis not present

## 2017-03-18 DIAGNOSIS — K921 Melena: Secondary | ICD-10-CM | POA: Diagnosis not present

## 2017-03-18 DIAGNOSIS — R1033 Periumbilical pain: Secondary | ICD-10-CM

## 2017-03-18 MED ORDER — POLYETHYLENE GLYCOL 3350 17 GM/SCOOP PO POWD: ORAL | 0 refills | Status: DC

## 2017-03-18 MED ORDER — PEG 3350-KCL-NA BICARB-NACL 420 G PO SOLR: 4000.0000 mL | ORAL | 0 refills | Status: DC

## 2017-03-18 NOTE — Telephone Encounter (Signed)
Opened in error

## 2017-03-18 NOTE — Progress Notes (Signed)
cc'ed to pcp °

## 2017-03-18 NOTE — Patient Instructions (Addendum)
1. Call back TODAY with complete list of your medications.  2. Colonoscopy and upper endoscopy as scheduled. See separate instructions.  3. Start Miralax one capful daily in 4 ounces of fluid. Hold if you have more than three stools daily.

## 2017-03-18 NOTE — Assessment & Plan Note (Signed)
Frequent reflux, unclear if he is on PPI.  To call back with complete medication list.  Given reported melena off and on, weight loss, consider upper endoscopy at time of colonoscopy.  I have discussed the risks, alternatives, benefits with regards to but not limited to the risk of reaction to medication, bleeding, infection, perforation and the patient is agreeable to proceed. Written consent to be obtained.

## 2017-03-18 NOTE — Progress Notes (Addendum)
Primary Care Physician:  Toma Deiters, MD  Primary Gastroenterologist:  Jonette Eva, MD  REVIEWED-NO ADDITIONAL RECOMMENDATIONS.  Chief Complaint  Patient presents with  . Diarrhea    does not happen daily but when he does it will last all day  . Abdominal Pain    Lower quadrant  . Nausea    w/ vomiting at times    HPI:  Charles Vasquez is a 82 y.o. male here for further evaluation of diarrhea, abdominal pain, abnormal colon on CT.  Patient is a difficult historian.  For several months he has been having issues with his bowel habits.  Reports about 10-15 pound weight loss associated with it.  Some days may have 3-4 stools daily.  Sometimes loose stools, sometimes [place] 1.  Sometimes has to strain. No blood in the stool.  His stools have been black off and on for the past month.  Denies Pepto-Bismol, iron.  Not taking any Imodium at this point.  Complains of intermittent periumbilical abdominal pain, not sure if it is related to food or associated with his bowel habits.  Intermittent vomiting, last time a couple of days ago.  Keeps heartburn all the time.  No dysphagia.  Has been to the ED a couple of times since October.  2 CT scans without contrast obtained.  Last one in November showed no evidence of obstruction.  Some wall thickening and change in caliber in the left upper quadrant at level of the splenic flexure again seen.  Seen on October CT as well.  Some mild wall thickening noted in the distal aspect of the colon may be related to underlying colitis?  No known prior colonoscopy.  Patient did not bring his medication list. Obtained list from [facility] that had only labetalol and losartan on it. Then he reports some came from [facility]. He confirmed insulin dosage. Denies blood thinners. He will call back today with medication list.     Current Outpatient Medications  Medication Sig Dispense Refill  . aspirin EC 81 MG tablet Take 81 mg by mouth daily.    . insulin glargine  (LANTUS) 100 UNIT/ML injection Inject 5 Units into the skin at bedtime.     Marland Kitchen labetalol (NORMODYNE) 300 MG tablet Take 300 mg by mouth once.    . latanoprost (XALATAN) 0.005 % ophthalmic solution Place 1 drop into the left eye at bedtime.    Marland Kitchen losartan (COZAAR) 100 MG tablet Take 50 mg by mouth daily.     . multivitamin (RENA-VIT) TABS tablet Take 1 tablet by mouth daily.     No current facility-administered medications for this visit.     Allergies as of 03/18/2017 - Review Complete 03/18/2017  Allergen Reaction Noted  . Lisinopril Other (See Comments)     Past Medical History:  Diagnosis Date  . Diabetes mellitus without complication (HCC)    lantus daily, type2  . Dizziness   . ESRD (end stage renal disease) on dialysis (HCC)    "[facility]; MWF" (10/22/2016)  . History of blood transfusion   . Hypertension   . Myocardial infarction (HCC)   . Pneumonia    last yr  . Stroke [facility])     Past Surgical History:  Procedure Laterality Date  . AMPUTATION Right 01/01/2017   Procedure: REVISION AMPUTATION RIGHT INDEX FINGER;  Surgeon: Dairl Ponder, MD;  [facility]: [facility] OR;  Service: Orthopedics;  Laterality: Right;  . ARTERIOVENOUS GRAFT PLACEMENT    . ARTERIOVENOUS GRAFT PLACEMENT Left 10/22/2016  thigh  . AV FISTULA PLACEMENT  03/31/2012   Procedure: ARTERIOVENOUS (AV) FISTULA CREATION;  Surgeon: [name], MD;  Location: [facility] OR;  Service: Vascular;  Laterality: Right;  First stage Brachial vein transposition  . AV FISTULA PLACEMENT Right 08/25/2012   Procedure: INSERTION OF ARTERIOVENOUS (AV) GORE-TEX GRAFT ARM;  Surgeon: [name], MD;  [facility] OR;  Service: Vascular;  Laterality: Right;  . AV FISTULA PLACEMENT Left 07/23/2016   Procedure: INSERTION OF ARTERIOVENOUS (AV) GORE-TEX GRAFT LEFT UPPER ARM;  Surgeon: [name], MD;  Location: [facility] OR;  Service: Vascular;  Laterality: Left;  . AV FISTULA PLACEMENT Left 10/22/2016   Procedure: INSERTION OF 4-33mm x 45cm   ARTERIOVENOUS (AV) GORE-TEX GRAFT THIGH-LEFT;  Surgeon: [name], MD;  Location: [facility] OR;  Service: Vascular;  Laterality: Left;  . EYE SURGERY Bilateral    cataracts  . INSERTION OF DIALYSIS CATHETER Left 05/05/2012   Procedure: INSERTION OF DIALYSIS CATHETER;  Surgeon: Chuck Hint, MD;  Location: [facility] OR;  Service: Vascular;  Laterality: Left;  . INSERTION OF DIALYSIS CATHETER Right 10/01/2016   Procedure: INSERTION OF DIALYSIS CATHETER- RIGHT FEMORAL;  Surgeon: [name], MD;  Location: [facility];  Service: Vascular;  Laterality: Right;  . IR FLUORO GUIDE CV LINE RIGHT  06/10/2016  . IR GENERIC HISTORICAL  06/07/2016   IR US GUIDE VASC ACCESS RIGHT 06/07/2016 Oley Balm, MD [facility]-INTERV RAD  . IR THROMBECTOMY AV FISTULA W/THROMBOLYSIS/PTA INC/SHUNT/IMG RIGHT Right 06/07/2016  . IR US GUIDE VASC ACCESS RIGHT  06/10/2016  . LIGATION ARTERIOVENOUS GORTEX GRAFT Left 08/04/2016   Procedure: LIGATION ARTERIOVENOUS GORTEX GRAFT;  Surgeon: Larina Earthly, MD;  Location: [facility] OR;  Service: Vascular;  Laterality: Left;  . LIGATION ARTERIOVENOUS GORTEX GRAFT Right 11/26/2016   Procedure: LIGATION OF RIGHT UPPER ARM ARTERIOVENOUS GORTEX GRAFT;  Surgeon: [name], MD;  Location: [facility] OR;  Service: Vascular;  Laterality: Right;  . LIGATION OF ARTERIOVENOUS  FISTULA Right 08/25/2012   Procedure: LIGATION OF ARTERIOVENOUS  FISTULA;  Surgeon: [name], MD;  Location: [facility] OR;  Service: Vascular;  Laterality: Right;  Ligation of right brachial vein transposition  . REMOVAL OF A DIALYSIS CATHETER Right 05/05/2012   Procedure: REMOVAL OF A DIALYSIS CATHETER;  Surgeon: Chuck Hint, MD;  Location: [facility] OR;  Service: Vascular;  Laterality: Right;  . REMOVAL OF A DIALYSIS CATHETER Right 10/01/2016   Procedure: REMOVAL OF A DIALYSIS CATHETER-RIGHT UPPER CHEST;  Surgeon: [name], MD;  Location: [facility] OR;  Service: Vascular;  Laterality: Right;  . REMOVAL OF A DIALYSIS CATHETER Right 11/26/2016   Procedure:  REMOVAL OF RIGHT FEMORAL TUNNEL DIALYSIS CATHETER;  Surgeon: [name], MD;  Location: [facility] OR;  Service: Vascular;  Laterality: Right;  . TOE AMPUTATION    . UPPER EXTREMITY VENOGRAPHY Bilateral 07/18/2016   Procedure: Bilateral Upper Extremity Venography;  Surgeon: [name], MD;  Location: [facility] INVASIVE CV LAB;  Service: Cardiovascular;  Laterality: Bilateral;    Family History  Problem Relation Age of Onset  . Diabetes Mother   . Hypertension Mother   . Diabetes Father   . Hypertension Father     Social History   Socioeconomic History  . Marital status: Widowed    Spouse name: Not on file  . Number of children: Not on file  . Years of education: Not on file  . Highest education level: Not on file  Social Needs  . Physicist, medical  strain: Not on file  . Food insecurity - worry: Not on file  . Food insecurity - inability: Not on file  . Transportation needs - medical: Not on file  . Transportation needs - non-medical: Not on file  Occupational History  . Not on file  Tobacco Use  . Smoking status: Never Smoker  . Smokeless tobacco: Never Used  Substance and Sexual Activity  . Alcohol use: No  . Drug use: No  . Sexual activity: Yes  Other Topics Concern  . Not on file  Social History Narrative  . Not on file      ROS:  General: Negative for anorexia,  fever, chills, fatigue, weakness. See hpi. Eyes: Negative for vision changes.  ENT: Negative for hoarseness, difficulty swallowing , nasal congestion. CV: Negative for chest pain, angina, palpitations, dyspnea on exertion, peripheral edema.  Respiratory: Negative for dyspnea at rest, dyspnea on exertion, cough, sputum, wheezing.  GI: See history of present illness. GU:  Negative for dysuria, hematuria, urinary incontinence, urinary frequency, nocturnal urination.  MS: Negative for joint pain, low back pain.  Derm: Negative for rash or itching.  Neuro: Negative for weakness, abnormal sensation, seizure,  frequent headaches, memory loss, confusion.  Psych: Negative for anxiety, depression, suicidal ideation, hallucinations.  Endo: see hpi Heme: Negative for bruising or bleeding. Allergy: Negative for rash or hives.    Physical Examination:  BP (!) 102/59   Pulse 73   Temp (!) 97 F (36.1 C) (Oral)   Ht 5\' 7"  (1.702 m)   Wt 128 lb 9.6 oz (58.3 kg)   BMI 20.14 kg/m    General: Well-nourished, well-developed in no acute distress. Difficult historian. Accompanied by girlfriend and sister.  Head: Normocephalic, atraumatic.   Eyes: Conjunctiva pink, no icterus. Mouth: Oropharyngeal mucosa moist and pink , no lesions erythema or exudate. Neck: Supple without thyromegaly, masses, or lymphadenopathy.  Lungs: Clear to auscultation bilaterally.  Heart: Regular rate and rhythm, no murmurs rubs or gallops.  Abdomen: Bowel sounds are normal, nontender, nondistended, no hepatosplenomegaly or masses, no abdominal bruits or    hernia , no rebound or guarding.   Rectal: not performed Extremities: No lower extremity edema. No clubbing or deformities.  Neuro: Alert and oriented x 4 , grossly normal neurologically.  Skin: Warm and dry, no rash or jaundice.   Psych: Alert and cooperative, normal mood and affect.  Labs: Lab Results  Component Value Date   LIPASE 19 01/26/2017   Lab Results  Component Value Date   CREATININE 7.54 (H) 01/26/2017   BUN 55 (H) 01/26/2017   NA 138 01/26/2017   K 3.2 (L) 01/26/2017   CL 106 01/26/2017   CO2 19 (L) 01/26/2017   Lab Results  Component Value Date   ALT 18 01/26/2017   AST 23 01/26/2017   ALKPHOS 68 01/26/2017   BILITOT 0.5 01/26/2017   Lab Results  Component Value Date   WBC 10.5 01/26/2017   HGB 12.3 (L) 01/26/2017   HCT 37.3 (L) 01/26/2017   MCV 100.0 01/26/2017   PLT 115 (L) 01/26/2017   No results found for: IRON, TIBC, FERRITIN   Imaging Studies: No results found.

## 2017-03-18 NOTE — Assessment & Plan Note (Addendum)
Abnormal colon on CT noted back in October and November 2018.  No evidence of obstruction.  Patient has intermittent loose stools and formed stools and may be getting some overflow diarrhea.  Will soften stools with MiraLAX but hold if develops more than 4 stools a day.  Associated weight loss, periumbilical abdominal pain.  Plan on a colonoscopy in the near future to evaluate symptoms as well as abnormality seen on CT.  I have discussed the risks, alternatives, benefits with regards to but not limited to the risk of reaction to medication, bleeding, infection, perforation and the patient is agreeable to proceed. Written consent to be obtained.

## 2017-03-20 ENCOUNTER — Telehealth: Payer: Self-pay

## 2017-03-20 ENCOUNTER — Telehealth: Payer: Self-pay | Admitting: Gastroenterology

## 2017-03-20 NOTE — Telephone Encounter (Signed)
Called and spoke to [employer] (pt's friend). Advised her pt should arrive at 2:00pm 03/25/17 for his TCS/EGD instead of 1:30pm ([facility] will be in a meeting).

## 2017-03-20 NOTE — Telephone Encounter (Signed)
Please call patient and update his medication list. They were supposed to call back on 03/28/17 after his appt and give me complete list of medication.   Please update and route back to me for review. Thanks.

## 2017-03-20 NOTE — Telephone Encounter (Signed)
I called and spoke to [facility] and she gave me pt's medication list.

## 2017-03-20 NOTE — Telephone Encounter (Signed)
See separate phone note for updated med list.

## 2017-03-21 NOTE — Telephone Encounter (Signed)
Addendum added to OV note. Thanks.

## 2017-03-21 NOTE — Progress Notes (Signed)
meds were updated, see epic med list. Only additional meds were TUMS, Refresh plus eye drops and NO xalatan.

## 2017-03-25 ENCOUNTER — Ambulatory Visit (HOSPITAL_COMMUNITY)
Admission: RE | Admit: 2017-03-25 | Discharge: 2017-03-25 | Disposition: A | Discharge status: Home / Self Care | Payer: Medicare Other | Source: Ambulatory Visit | Attending: Gastroenterology | Admitting: Gastroenterology

## 2017-03-25 ENCOUNTER — Encounter (HOSPITAL_COMMUNITY)
Admission: RE | Disposition: A | Discharge status: Home / Self Care | Payer: Self-pay | Source: Ambulatory Visit | Attending: Gastroenterology

## 2017-03-25 ENCOUNTER — Telehealth: Payer: Self-pay | Admitting: Gastroenterology

## 2017-03-25 ENCOUNTER — Other Ambulatory Visit: Payer: Self-pay

## 2017-03-25 ENCOUNTER — Encounter (HOSPITAL_COMMUNITY): Payer: Self-pay | Admitting: *Deleted

## 2017-03-25 DIAGNOSIS — E1122 Type 2 diabetes mellitus with diabetic chronic kidney disease: Secondary | ICD-10-CM | POA: Diagnosis not present

## 2017-03-25 DIAGNOSIS — Z992 Dependence on renal dialysis: Secondary | ICD-10-CM | POA: Insufficient documentation

## 2017-03-25 DIAGNOSIS — R194 Change in bowel habit: Secondary | ICD-10-CM | POA: Diagnosis not present

## 2017-03-25 DIAGNOSIS — Z7982 Long term (current) use of aspirin: Secondary | ICD-10-CM | POA: Insufficient documentation

## 2017-03-25 DIAGNOSIS — Z79899 Other long term (current) drug therapy: Secondary | ICD-10-CM | POA: Insufficient documentation

## 2017-03-25 DIAGNOSIS — Z89021 Acquired absence of right finger(s): Secondary | ICD-10-CM | POA: Insufficient documentation

## 2017-03-25 DIAGNOSIS — N186 End stage renal disease: Secondary | ICD-10-CM | POA: Diagnosis not present

## 2017-03-25 DIAGNOSIS — I252 Old myocardial infarction: Secondary | ICD-10-CM | POA: Diagnosis not present

## 2017-03-25 DIAGNOSIS — K624 Stenosis of anus and rectum: Secondary | ICD-10-CM

## 2017-03-25 DIAGNOSIS — Q438 Other specified congenital malformations of intestine: Secondary | ICD-10-CM | POA: Insufficient documentation

## 2017-03-25 DIAGNOSIS — Z794 Long term (current) use of insulin: Secondary | ICD-10-CM | POA: Diagnosis not present

## 2017-03-25 DIAGNOSIS — K222 Esophageal obstruction: Secondary | ICD-10-CM | POA: Insufficient documentation

## 2017-03-25 DIAGNOSIS — K2950 Unspecified chronic gastritis without bleeding: Secondary | ICD-10-CM | POA: Insufficient documentation

## 2017-03-25 DIAGNOSIS — K219 Gastro-esophageal reflux disease without esophagitis: Secondary | ICD-10-CM

## 2017-03-25 DIAGNOSIS — Z888 Allergy status to other drugs, medicaments and biological substances status: Secondary | ICD-10-CM | POA: Insufficient documentation

## 2017-03-25 DIAGNOSIS — R1010 Upper abdominal pain, unspecified: Secondary | ICD-10-CM | POA: Diagnosis not present

## 2017-03-25 DIAGNOSIS — R109 Unspecified abdominal pain: Secondary | ICD-10-CM

## 2017-03-25 DIAGNOSIS — R634 Abnormal weight loss: Secondary | ICD-10-CM | POA: Diagnosis not present

## 2017-03-25 DIAGNOSIS — I120 Hypertensive chronic kidney disease with stage 5 chronic kidney disease or end stage renal disease: Secondary | ICD-10-CM | POA: Diagnosis not present

## 2017-03-25 DIAGNOSIS — R933 Abnormal findings on diagnostic imaging of other parts of digestive tract: Secondary | ICD-10-CM

## 2017-03-25 DIAGNOSIS — K921 Melena: Secondary | ICD-10-CM

## 2017-03-25 DIAGNOSIS — K2970 Gastritis, unspecified, without bleeding: Secondary | ICD-10-CM | POA: Diagnosis not present

## 2017-03-25 DIAGNOSIS — R197 Diarrhea, unspecified: Secondary | ICD-10-CM

## 2017-03-25 DIAGNOSIS — Z8673 Personal history of transient ischemic attack (TIA), and cerebral infarction without residual deficits: Secondary | ICD-10-CM | POA: Diagnosis not present

## 2017-03-25 HISTORY — PX: COLONOSCOPY: SHX5424

## 2017-03-25 HISTORY — PX: ESOPHAGOGASTRODUODENOSCOPY: SHX5428

## 2017-03-25 LAB — GLUCOSE, CAPILLARY: Glucose-Capillary: 118 mg/dL — ABNORMAL HIGH (ref 65–99)

## 2017-03-25 SURGERY — COLONOSCOPY
Anesthesia: Moderate Sedation

## 2017-03-25 MED ORDER — SENNOSIDES-DOCUSATE SODIUM 8.6-50 MG PO TABS: 2.0000 | ORAL_TABLET | Freq: Every day | ORAL | 1 refills | Status: DC | PRN

## 2017-03-25 MED ORDER — DICYCLOMINE HCL 10 MG PO CAPS: ORAL_CAPSULE | ORAL | 11 refills | Status: AC

## 2017-03-25 MED ORDER — BIFERA 28 MG PO TABS: ORAL_TABLET | ORAL | 11 refills | Status: DC

## 2017-03-25 MED ORDER — SODIUM CHLORIDE 0.9 % IV SOLN
INTRAVENOUS | Status: DC
  Administered 2017-03-25: 15:00:00 via INTRAVENOUS

## 2017-03-25 MED ORDER — PANTOPRAZOLE SODIUM 40 MG PO TBEC: DELAYED_RELEASE_TABLET | ORAL | 11 refills | Status: DC

## 2017-03-25 MED ORDER — DOCUSATE SODIUM 100 MG PO CAPS: 100.0000 mg | ORAL_CAPSULE | Freq: Every day | ORAL | 11 refills | Status: DC

## 2017-03-25 MED ORDER — MIDAZOLAM HCL 5 MG/5ML IJ SOLN
INTRAMUSCULAR | Status: DC | PRN
  Administered 2017-03-25: 1 mg via INTRAVENOUS
  Administered 2017-03-25: 2 mg via INTRAVENOUS
  Administered 2017-03-25: 1 mg via INTRAVENOUS

## 2017-03-25 MED ORDER — FENTANYL CITRATE (PF) 100 MCG/2ML IJ SOLN
INTRAMUSCULAR | Status: AC
  Filled 2017-03-25: qty 2

## 2017-03-25 MED ORDER — MIDAZOLAM HCL 5 MG/5ML IJ SOLN
INTRAMUSCULAR | Status: AC
  Filled 2017-03-25: qty 10

## 2017-03-25 MED ORDER — FENTANYL CITRATE (PF) 100 MCG/2ML IJ SOLN
INTRAMUSCULAR | Status: DC | PRN
  Administered 2017-03-25: 50 ug via INTRAVENOUS
  Administered 2017-03-25: 25 ug via INTRAVENOUS

## 2017-03-25 MED ORDER — LIDOCAINE VISCOUS 2 % MT SOLN
OROMUCOSAL | Status: AC
  Filled 2017-03-25: qty 15

## 2017-03-25 MED ORDER — MEPERIDINE HCL 100 MG/ML IJ SOLN
INTRAMUSCULAR | Status: AC
  Filled 2017-03-25: qty 2

## 2017-03-25 NOTE — Discharge Instructions (Signed)
YOUR WEIGHT LOSS AND ABDOMINAL PAIN IS DUE TO GASTRITIS AND ANAL STENOSIS. THE ANAL STENOSIS MAKES IT DIFFICULT TO PASS STOOL AND CAN LEAD TO MID TO LOWER ABDOMINAL PAIN AND RECTAL PAIN.  YOU MAY SEE BLACK TARRY STOOLS DUE TO HAVING AVMs IN YOUR SMALL BOWEL. THEY WERE NOT BLEEDING TODAY. You have gastritis MOST LIKELY DUE TO ASPIRIN.  I biopsied your stomach.   TO REDUCE ABDOMINAL PAIN, TAKE DICYCLOMINE 30 MINUTES PRIOR TO BREAKFAST AND LUNCH. IT MAY CAUSE DROWSINESS, DRY EYES/MOUTH, BLURRY VISION, OR DIFFICULTY URINATING.  TO SOFTEN STOOL, ADD COLACE DAILY.  TO PREVENT A LOW BLOOD COUNT, START BIFERA TWICE DAILY.  TO TREAT GASTRITIS, START PROTONIX.  TAKE 30 MINUTES PRIOR TO YOUR MEALS TWICE DAILY.  YOUR BIOPSY RESULTS WILL BE AVAILABLE IN MY CHART AFTER JAN 19 AND MY OFFICE WILL CONTACT YOU IN 10-14 DAYS WITH YOUR RESULTS.   PLEASE CALL IN ONE MONTH IF SYMPTOMS ARE NOT IMPROVED.   SEE SURGERY TO STRETCH YOUR ANAL CANAL.   FOLLOW UP IN 3 MOS.      ENDOSCOPY Care After Read the instructions outlined below and refer to this sheet in the next week. These discharge instructions provide you with general information on caring for yourself after you leave the hospital. While your treatment has been planned according to the most current medical practices available, unavoidable complications occasionally occur. If you have any problems or questions after discharge, call DR. FIELDS, [phone].  ACTIVITY  You may resume your regular activity, but move at a slower pace for the next 24 hours.   Take frequent rest periods for the next 24 hours.   Walking will help get rid of the air and reduce the bloated feeling in your belly (abdomen).   No driving for 24 hours (because of the medicine (anesthesia) used during the test).   You may shower.   Do not sign any important legal documents or operate any machinery for 24 hours (because of the anesthesia used during the test).     NUTRITION  Drink plenty of fluids.   You may resume your normal diet as instructed by your doctor.   Begin with a light meal and progress to your normal diet. Heavy or fried foods are harder to digest and may make you feel sick to your stomach (nauseated).   Avoid alcoholic beverages for 24 hours or as instructed.    MEDICATIONS  You may resume your normal medications.   WHAT YOU CAN EXPECT TODAY  Some feelings of bloating in the abdomen.   Passage of more gas than usual.   Spotting of blood in your stool or on the toilet paper  .  IF YOU HAD POLYPS REMOVED DURING THE ENDOSCOPY:  Eat a soft diet IF YOU HAVE NAUSEA, BLOATING, ABDOMINAL PAIN, OR VOMITING.    FINDING OUT THE RESULTS OF YOUR TEST Not all test results are available during your visit. DR. Darrick Penna WILL CALL YOU WITHIN 14 DAYS OF YOUR PROCEDUE WITH YOUR RESULTS. Do not assume everything is normal if you have not heard from DR. FIELDS IN TWO WEEKS, CALL HER OFFICE AT [phone].  SEEK IMMEDIATE MEDICAL ATTENTION AND CALL THE OFFICE: [phone] IF:  You have more than a spotting of blood in your stool.   Your belly is swollen (abdominal distention).   You are nauseated or vomiting.   You have a temperature over 101F.   You have abdominal pain or discomfort that is severe or gets worse throughout the day.  Gastritis  Gastritis is an inflammation (the body's way of reacting to injury and/or infection) of the stomach. It is often caused by viral or bacterial (germ) infections. It can also be caused BY ALCOHOL, ASPIRIN, BC/GOODY POWDER'S, (IBUPROFEN) MOTRIN, OR ALEVE (NAPROXEN), chemicals (including alcohol), SPICY FOODS, and medications. This illness may be associated with generalized malaise (feeling tired, not well), UPPER ABDOMINAL STOMACH cramps, and fever. One common bacterial cause of gastritis is an organism known as H. Pylori. This can be treated with antibiotics.    Arteriovenous  Malformation An arteriovenous malformation (AVM) is a disorder that has been present since birth (congenital). It is characterized by a complex, tangled web of arteries and veins(RED SPOTS ON THE SURFACE OF YOUR INTESTINES). An AVM may occur in the STOMACH, COLON, OR SMALL BOWEL.  SYMPTOMS  The most common problems (symptoms) of AVM include:  Bleeding (hemorrhaging): RECTAL BLEEDING, BLACK TARRY STOOLS  ANEMIA: LOW BLOOD COUNT  TREATMENT  There are three general forms of treatment for AVM: **ABLATION WITH HEAT

## 2017-03-25 NOTE — H&P (Addendum)
Primary Care Physician:  Toma Deiters, MD Primary Gastroenterologist:  Dr. Darrick Penna  Pre-Procedure History & Physical: HPI:  Charles Vasquez is a 82 y.o. male here for Change in bowel habits/DYSPEPSIA/?DYSPHAGIA.  Past Medical History:  Diagnosis Date  . Diabetes mellitus without complication (HCC)    lantus daily, type2  . Dizziness   . ESRD (end stage renal disease) on dialysis (HCC)    "[facility]; MWF" (10/22/2016)  . History of blood transfusion   . Hypertension   . Myocardial infarction (HCC)   . Pneumonia    last yr  . Stroke [facility])     Past Surgical History:  Procedure Laterality Date  . AMPUTATION Right 01/01/2017   Procedure: REVISION AMPUTATION RIGHT INDEX FINGER;  Surgeon: Dairl Ponder, MD;  Location: [facility] OR;  Service: Orthopedics;  Laterality: Right;  . ARTERIOVENOUS GRAFT PLACEMENT    . ARTERIOVENOUS GRAFT PLACEMENT Left 10/22/2016   thigh  . AV FISTULA PLACEMENT  03/31/2012   Procedure: ARTERIOVENOUS (AV) FISTULA CREATION;  Surgeon: [name], MD;  Location: [facility] OR;  Service: Vascular;  Laterality: Right;  First stage Brachial vein transposition  . AV FISTULA PLACEMENT Right 08/25/2012   Procedure: INSERTION OF ARTERIOVENOUS (AV) GORE-TEX GRAFT ARM;  Surgeon: [name], MD;  [facility] OR;  Service: Vascular;  Laterality: Right;  . AV FISTULA PLACEMENT Left 07/23/2016   Procedure: INSERTION OF ARTERIOVENOUS (AV) GORE-TEX GRAFT LEFT UPPER ARM;  Surgeon: [name], MD;  Location: [facility] OR;  Service: Vascular;  Laterality: Left;  . AV FISTULA PLACEMENT Left 10/22/2016   Procedure: INSERTION OF 4-49mm x 45cm  ARTERIOVENOUS (AV) GORE-TEX GRAFT THIGH-LEFT;  Surgeon: [name], MD;  Location: [facility] OR;  Service: Vascular;  Laterality: Left;  . EYE SURGERY Bilateral    cataracts  . INSERTION OF DIALYSIS CATHETER Left 05/05/2012   Procedure: INSERTION OF DIALYSIS CATHETER;  Surgeon: Chuck Hint, MD;  Location: [facility] OR;  Service: Vascular;  Laterality: Left;  .  INSERTION OF DIALYSIS CATHETER Right 10/01/2016   Procedure: INSERTION OF DIALYSIS CATHETER- RIGHT FEMORAL;  Surgeon: [name], MD;  Location: [facility] OR;  Service: Vascular;  Laterality: Right;  . IR FLUORO GUIDE CV LINE RIGHT  06/10/2016  . IR GENERIC HISTORICAL  06/07/2016   IR US GUIDE VASC ACCESS RIGHT 06/07/2016 Oley Balm, MD [facility]-INTERV RAD  . IR THROMBECTOMY AV FISTULA W/THROMBOLYSIS/PTA INC/SHUNT/IMG RIGHT Right 06/07/2016  . IR US GUIDE VASC ACCESS RIGHT  06/10/2016  . LIGATION ARTERIOVENOUS GORTEX GRAFT Left 08/04/2016   Procedure: LIGATION ARTERIOVENOUS GORTEX GRAFT;  Surgeon: Larina Earthly, MD;  Location: [facility] OR;  Service: Vascular;  Laterality: Left;  . LIGATION ARTERIOVENOUS GORTEX GRAFT Right 11/26/2016   Procedure: LIGATION OF RIGHT UPPER ARM ARTERIOVENOUS GORTEX GRAFT;  Surgeon: [name], MD;  Location: [facility] OR;  Service: Vascular;  Laterality: Right;  . LIGATION OF ARTERIOVENOUS  FISTULA Right 08/25/2012   Procedure: LIGATION OF ARTERIOVENOUS  FISTULA;  Surgeon: [name], MD;  Location: [facility] OR;  Service: Vascular;  Laterality: Right;  Ligation of right brachial vein transposition  . REMOVAL OF A DIALYSIS CATHETER Right 05/05/2012   Procedure: REMOVAL OF A DIALYSIS CATHETER;  Surgeon: Chuck Hint, MD;  Location: [facility] OR;  Service: Vascular;  Laterality: Right;  . REMOVAL OF A DIALYSIS CATHETER Right 10/01/2016   Procedure: REMOVAL OF A DIALYSIS CATHETER-RIGHT UPPER CHEST;  Surgeon: [name], MD;  Location: [facility] OR;  Service: Vascular;  Laterality: Right;  .  REMOVAL OF A DIALYSIS CATHETER Right 11/26/2016   Procedure: REMOVAL OF RIGHT FEMORAL TUNNEL DIALYSIS CATHETER;  Surgeon: [name], MD;  Location: [facility] OR;  Service: Vascular;  Laterality: Right;  . TOE AMPUTATION    . UPPER EXTREMITY VENOGRAPHY Bilateral 07/18/2016   Procedure: Bilateral Upper Extremity Venography;  Surgeon: [name], MD;  Location: [facility] INVASIVE CV LAB;  Service: Cardiovascular;  Laterality:  Bilateral;    Prior to Admission medications   Medication Sig Start Date End Date Taking? Authorizing Provider  aspirin EC 81 MG tablet Take 81 mg by mouth daily.   Yes [provider]  calcium carbonate (TUMS - DOSED IN MG ELEMENTAL CALCIUM) 500 MG chewable tablet Chew 1 tablet by mouth daily as needed for indigestion or heartburn.   Yes [provider]  carboxymethylcellulose (REFRESH PLUS) 0.5 % SOLN 1 drop 3 (three) times daily as needed. AS DIRECTED   Yes [provider]  insulin glargine (LANTUS) 100 UNIT/ML injection Inject 5 Units into the skin at bedtime.    Yes [provider]  labetalol (NORMODYNE) 300 MG tablet Take 300 mg by mouth 2 (two) times daily.    Yes [provider]  losartan (COZAAR) 50 MG tablet Take 50 mg by mouth daily.   Yes [provider]  multivitamin (RENA-VIT) TABS tablet Take 1 tablet by mouth daily.   Yes [provider]  polyethylene glycol-electrolytes (TRILYTE) 420 g solution Take 4,000 mLs by mouth as directed. 03/18/17  Yes Geniene List L, MD  polyethylene glycol powder (GLYCOLAX/MIRALAX) powder Take one capful mixed in 4 ounces of liquid once daily to keep stools soft. Hold if you have more than three stools daily. Patient not taking: Reported on 03/18/2017 03/18/17   Tiffany Kocher, PA-C    Allergies as of 03/18/2017 - Review Complete 03/18/2017  Allergen Reaction Noted  . Lisinopril Other (See Comments)     Family History  Problem Relation Age of Onset  . Diabetes Mother   . Hypertension Mother   . Diabetes Father   . Hypertension Father     Social History   Socioeconomic History  . Marital status: Widowed    Spouse name: Not on file  . Number of children: Not on file  . Years of education: Not on file  . Highest education level: Not on file  Social Needs  . Financial resource strain: Not on file  . Food insecurity - worry: Not on file  . Food insecurity - inability: Not on  file  . Transportation needs - medical: Not on file  . Transportation needs - non-medical: Not on file  Occupational History  . Not on file  Tobacco Use  . Smoking status: Never Smoker  . Smokeless tobacco: Never Used  Substance and Sexual Activity  . Alcohol use: No  . Drug use: No  . Sexual activity: Yes  Other Topics Concern  . Not on file  Social History Narrative  . Not on file    Review of Systems: See HPI, otherwise negative ROS   Physical Exam: There were no vitals taken for this visit. General:   Alert,  pleasant and cooperative in NAD Head:  Normocephalic and atraumatic. Neck:  Supple; Lungs:  Clear throughout to auscultation.    Heart:  Regular rate and rhythm. Abdomen:  Soft, nontender and nondistended. Normal bowel sounds, without guarding, and without rebound.   Neurologic:  Alert and  oriented x4;  grossly normal neurologically.  Impression/Plan:  Change in bowel habits   PLAN:  1. TCS/EGD/POSSIBLE DILATION ESOPHAGEAL OR PYLORIC TODAY DISCUSSED PROCEDURE, BENEFITS, & RISKS: < 1% chance of medication reaction, bleeding, perforation, or rupture of spleen/liver.

## 2017-03-25 NOTE — Telephone Encounter (Signed)
SEE SURGERY: DR. Lovell Sheehan ONLY, Dx: ANAL STENOSIS, EVALUATE FOR DILATION OF THE ANAL CANAL.

## 2017-03-26 NOTE — Op Note (Signed)
[facility] Patient Name: Charles Vasquez Procedure Date: 03/25/2017 3:13 PM MRN: [id number] Date of Birth: 1935-12-28 Attending MD: Jonette Eva MD, MD CSN: [id number] Age: 82 Admit Type: Outpatient Procedure:                Colonoscopy, DIAGNOSTIC Indications:              Abnormal CT of the GI tract-SPLENIC FLEXURE COLONIC                            WALL THICKENED. Providers:                Jonette Eva MD, MD, Jannett Celestine, RN, Ina Homes,                            Technician Referring MD:             Lia Hopping MD, MD Medicines:                Fentanyl 75 micrograms IV, Midazolam 3 mg IV Complications:            No immediate complications. Estimated Blood Loss:     Estimated blood loss: none. Procedure:                Pre-Anesthesia Assessment:                           - Prior to the procedure, a History and Physical                            was performed, and patient medications and                            allergies were reviewed. The patient's tolerance of                            previous anesthesia was also reviewed. The risks                            and benefits of the procedure and the sedation                            options and risks were discussed with the patient.                            All questions were answered, and informed consent                            was obtained. Prior Anticoagulants: The patient has                            taken aspirin, last dose was day of procedure. ASA                            Grade Assessment: II - A patient with mild systemic  disease. After reviewing the risks and benefits,                            the patient was deemed in satisfactory condition to                            undergo the procedure.                           After obtaining informed consent, the colonoscope                            was passed under direct vision. Throughout the   procedure, the patient's blood pressure, pulse, and                            oxygen saturations were monitored continuously. The                            [id number] ([id number]) scope was introduced through                            the anus and advanced to the 7 cm into the ileum.                            The colonoscopy was somewhat difficult due to a                            tortuous colon. Successful completion of the                            procedure was aided by COLOWRAP. The patient                            tolerated the procedure well. The terminal ileum,                            ileocecal valve, appendiceal orifice, and rectum                            were photographed. The quality of the bowel                            preparation was excellent. Scope In: 4:40:32 PM Scope Out: 4:51:22 PM Scope Withdrawal Time: 0 hours 8 minutes 19 seconds  Total Procedure Duration: 0 hours 10 minutes 50 seconds  Findings:      The terminal ileum appeared normal.      The recto-sigmoid colon and sigmoid colon were moderately redundant.      The exam was otherwise without abnormality. NO LESION SEEN AT SPLENIC       FLEXURE. MUCOSA IS NORMAL.      A benign-appearing, intrinsic moderate stenosis measuring 1.1 cm (inner       diameter) was found at the anus and was traversed. Impression:               -  The examined portion of the ileum was normal.                           - Redundant LEFT colon.                           - The examination was otherwise normal.                           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 Moderate Sedation:      Moderate (conscious) sedation was administered by the endoscopy nurse       and supervised by the endoscopist. The following parameters were       monitored: oxygen saturation, heart rate, blood pressure, and response       to care. Total physician intraservice time was 50 minutes. Recommendation:           - Resume previous diet.                            - Continue present medications.                           - Return to my office in 3 months.                           TO REDUCE ABDOMINAL PAIN, TAKE DICYCLOMINE 30                            MINUTES PRIOR TO BREAKFAST AND LUNCH. IT MAY CAUSE                            DROWSINESS, DRY EYES/MOUTH, BLURRY VISION, OR                            DIFFICULTY URINATING.                           TO SOFTEN STOOL, ADD COLACE DAILY.                           TO PREVENT A LOW BLOOD COUNT, START BIFERA TWICE                            DAILY.                           TO TREAT GASTRITIS, START PROTONIX. TAKE 30 MINUTES                            PRIOR TO YOUR MEALS TWICE DAILY.                           CALL IN ONE MONTH IF SYMPTOMS ARE NOT IMPROVED.                           SEE SURGERY TO STRETCH  YOUR ANAL CANAL.                           - Patient has a contact number available for                            emergencies. The signs and symptoms of potential                            delayed complications were discussed with the                            patient. Return to normal activities tomorrow.                            Written discharge instructions were provided to the                            patient.                           - No repeat colonoscopy due to age. Procedure Code(s):        --- Professional ---                           [phone], Colonoscopy, flexible; diagnostic, including                            collection of specimen(s) by brushing or washing,                            when performed (separate procedure)                           99152, Moderate sedation services provided by the                            same physician or other qualified health care                            professional performing the diagnostic or                            therapeutic service that the sedation supports,                            requiring the presence of an independent  trained                            observer to assist in the monitoring of the                            patient's level of consciousness and physiological  status; initial 15 minutes of intraservice time,                            patient age 58 years or older                           [phone], Moderate sedation services; each additional                            15 minutes intraservice time                           [phone], Moderate sedation services; each additional                            15 minutes intraservice time Diagnosis Code(s):        --- Professional ---                           K62.4, Stenosis of anus and rectum                           R93.3, Abnormal findings on diagnostic imaging of                            other parts of digestive tract                           Q43.8, Other specified congenital malformations of                            intestine CPT copyright 2016 [employer]. All rights reserved. The codes documented in this report are preliminary and upon coder review may  be revised to meet current compliance requirements. Jonette Eva, MD Jonette Eva MD, MD 03/26/2017 12:47:09 PM This report has been signed electronically. Number of Addenda: 0

## 2017-03-26 NOTE — Op Note (Signed)
[facility] Patient Name: Charles Vasquez Procedure Date: 03/25/2017 4:55 PM MRN: [id number] Date of Birth: [birth year] Attending MD: Jonette Eva MD, MD CSN: [id number] Age: 82 Admit Type: Outpatient Procedure:                Upper GI endoscopy WITH ESOPHAGEAL DILATION/COLD                            FORCEPS BIOPSY Indications:              Upper abdominal pain, Weight loss Providers:                Jonette Eva MD, MD, Jannett Celestine, RN, Ina Homes,                            Technician Referring MD:             Lia Hopping MD, MD Medicines:                TCS + Midazolam 1 mg IV Complications:            No immediate complications. Estimated Blood Loss:     Estimated blood loss was minimal. Procedure:                Pre-Anesthesia Assessment:                           - Prior to the procedure, a History and Physical                            was performed, and patient medications and                            allergies were reviewed. The patient's tolerance of                            previous anesthesia was also reviewed. The risks                            and benefits of the procedure and the sedation                            options and risks were discussed with the patient.                            All questions were answered, and informed consent                            was obtained. Prior Anticoagulants: The patient has                            taken aspirin, last dose was day of procedure. ASA                            Grade Assessment: II - A patient with mild systemic  disease. After reviewing the risks and benefits,                            the patient was deemed in satisfactory condition to                            undergo the procedure. After obtaining informed                            consent, the endoscope was passed under direct                            vision. Throughout the procedure, the patient's              blood pressure, pulse, and oxygen saturations were                            monitored continuously. The [id number] ([id number])                            scope was introduced through the mouth, and                            advanced to the second part of duodenum. The upper                            GI endoscopy was accomplished without difficulty.                            The patient tolerated the procedure well. Scope In: 4:59:16 PM Scope Out: 5:15:13 PM Total Procedure Duration: 0 hours 15 minutes 57 seconds  Findings:      A widely patent Schatzki ring (acquired) was found at the       gastroesophageal junction. A guidewire was placed and the scope was       withdrawn. Dilation was performed with a Savary dilator with no       resistance at 16 mm and 17 mm. Estimated blood loss: none.      Diffuse moderate inflammation characterized by congestion (edema),       erosions and erythema was found in the entire examined stomach. Biopsies       were taken with a cold forceps for Helicobacter pylori testing.      A single large angioectasia without bleeding was found in the second       portion of the duodenum. Impression:               - Widely patent Schatzki ring. Dilated.                           - MODERATE Gastritis. Biopsied.                           - SINGLE DUODENAL AVM, NONBLEEDING Moderate Sedation:      Moderate (conscious) sedation was administered by the endoscopy nurse       and supervised by the endoscopist. The following parameters were  monitored: oxygen saturation, heart rate, blood pressure, and response       to care. Total physician intraservice time was 50 minutes. Recommendation:           - Await pathology results.                           - Resume previous diet.                           - Continue present medications.                           - Return to my office in 3 months.                           TO REDUCE ABDOMINAL PAIN, TAKE  DICYCLOMINE 30                            MINUTES PRIOR TO BREAKFAST AND LUNCH. IT MAY CAUSE                            DROWSINESS, DRY EYES/MOUTH, BLURRY VISION, OR                            DIFFICULTY URINATING.                           TO SOFTEN STOOL, ADD COLACE DAILY.                           TO PREVENT A LOW BLOOD COUNT, START BIFERA TWICE                            DAILY.                           TO TREAT GASTRITIS, START PROTONIX. TAKE 30 MINUTES                            PRIOR TO YOUR MEALS TWICE DAILY.                           PLEASE CALL IN ONE MONTH IF SYMPTOMS ARE NOT                            IMPROVED.                           SEE SURGERY TO STRETCH YOUR ANAL CANAL.                           - Patient has a contact number available for                            emergencies. The signs and symptoms of potential  delayed complications were discussed with the                            patient. Return to normal activities tomorrow.                            Written discharge instructions were provided to the                            patient. Procedure Code(s):        --- Professional ---                           [id number], Esophagogastroduodenoscopy, flexible,                            transoral; with insertion of guide wire followed by                            passage of dilator(s) through esophagus over guide                            wire                           43239, Esophagogastroduodenoscopy, flexible,                            transoral; with biopsy, single or multiple                           99152, Moderate sedation services provided by the                            same physician or other qualified health care                            professional performing the diagnostic or                            therapeutic service that the sedation supports,                            requiring the presence of an independent  trained                            observer to assist in the monitoring of the                            patient's level of consciousness and physiological                            status; initial 15 minutes of intraservice time,                            patient age 62  years or older                           [phone], Moderate sedation services; each additional                            15 minutes intraservice time                           99153, Moderate sedation services; each additional                            15 minutes intraservice time Diagnosis Code(s):        --- Professional ---                           K22.2, Esophageal obstruction                           K29.70, Gastritis, unspecified, without bleeding                           R10.10, Upper abdominal pain, unspecified                           R63.4, Abnormal weight loss CPT copyright 2016 American Medical Association. All rights reserved. The codes documented in this report are preliminary and upon coder review may  be revised to meet current compliance requirements. Jonette Eva, MD Jonette Eva MD, MD 03/26/2017 12:57:16 PM This report has been signed electronically. Number of Addenda: 0

## 2017-03-26 NOTE — Addendum Note (Signed)
Addended by: Tommie Sams on: 03/26/2017 07:27 AM   Modules accepted: Orders

## 2017-03-26 NOTE — Telephone Encounter (Signed)
Referral placed.

## 2017-03-27 ENCOUNTER — Telehealth: Payer: Self-pay

## 2017-03-27 ENCOUNTER — Encounter (HOSPITAL_COMMUNITY): Payer: Self-pay | Admitting: Gastroenterology

## 2017-03-27 ENCOUNTER — Telehealth: Payer: Self-pay | Admitting: Gastroenterology

## 2017-03-27 NOTE — Telephone Encounter (Signed)
Pt had colonoscopy done by [facility] on Tuesday and his insurance is denying his prescription (doesn't know the name). Are we doing a PA or can we change prescription to something else that his insurance will approve? Please advise and call [phone]

## 2017-03-27 NOTE — Telephone Encounter (Signed)
Busy

## 2017-03-27 NOTE — Telephone Encounter (Signed)
Tried to call a couple of times and line was busy.

## 2017-03-27 NOTE — Telephone Encounter (Signed)
See previous message

## 2017-03-27 NOTE — Telephone Encounter (Signed)
Busy x 2. 

## 2017-03-27 NOTE — Telephone Encounter (Signed)
PLEASE CALL PT. No iron supplement is covered. It is all over the counter.

## 2017-03-27 NOTE — Telephone Encounter (Signed)
L/M to call.

## 2017-03-27 NOTE — Telephone Encounter (Signed)
Dr. Darrick Penna,   [place] is not covered. Please advise!

## 2017-03-28 ENCOUNTER — Telehealth: Payer: Self-pay | Admitting: Gastroenterology

## 2017-03-28 NOTE — Telephone Encounter (Signed)
LMOM on mobile. Could not leave Vm at home. Mailed letter to pt that he can buy the Bifera or Ferrous sulfate otc and to call with questions.

## 2017-03-28 NOTE — Telephone Encounter (Signed)
Pt's friend, Collene Leyden called for the message ( her name is on his release of information). She is aware he can buy Bifera or Ferrous Sulfate OTC and I spelled it out for her.

## 2017-03-28 NOTE — Telephone Encounter (Signed)
PT HAVING DIARRHEA, BUT THIS IS WORSE.  CONSTANTLY GOING BACK AND FORTH TO THE BATHROOM.TAKING DICYCLOMINE. BEEN ON PANTOPRAZOLE SINCE TUES.HASN'T STARTED THE IRON PILLS. SISTER GOING TO PICKUP THE IRON PILLS. ONLY HAD ONE DOSE DICYCLOMINE TODAY.   TAKE SECOND DOSE TODAY. STOP COLACE AND PROTONIX. START IRON. CALL MON WITH AN UPDATE. IF HAVING TNTC WATERY STOOLS, WILL NEED TO GO TO THE ED.

## 2017-03-28 NOTE — Telephone Encounter (Addendum)
Please call pt. His stomach Bx shows ATROPHIC gastritis. ATROPHIC GASTRITIS MEANS THE ACID CELLS IN HIS STOMACH ARE NOT PRODUCING ENOUGH ACID. TAKE PROTONIX ONCE DAILY. FOLLOW UP APR 2019 E30 ATROPHIC GASTRITIS/DYSPHAGIA.

## 2017-03-31 ENCOUNTER — Encounter: Payer: Self-pay | Admitting: Gastroenterology

## 2017-03-31 NOTE — Telephone Encounter (Signed)
PATIENT SCHEDULED  °

## 2017-04-01 NOTE — Telephone Encounter (Signed)
PT returned call and said his stools are not watery now, but very loose and he goes about 3 times a day. He said he hurts really bad when he does go.  Please advise!

## 2017-04-01 NOTE — Telephone Encounter (Signed)
LMOM to call.

## 2017-04-01 NOTE — Telephone Encounter (Signed)
PLEASE CALL PT. HE SHOULD CONTINUE BENTYL. WE DO NOT WANT HIM TO HAVE FORMED STOOL BECAUSE HE WILL HAVE MORE RECTAL PAIN IF HE PASSES MORE FORMED STOOL. I PERSONALLY REVIEWED HIS CT FROM NOV 2018 WITH DR. Tyron Russell. HE NEEDS TO SEE SURGERY DR. Lovell Sheehan FEB 5 TO DISCUSS DILATING HIS ANAL CANAL.

## 2017-04-02 ENCOUNTER — Telehealth: Payer: Self-pay | Admitting: Gastroenterology

## 2017-04-02 NOTE — Telephone Encounter (Signed)
Pt is aware. He is also aware if he would like for [place] to speak with any family about his results, etc, that he can come by and sign a paper.

## 2017-04-02 NOTE — Telephone Encounter (Signed)
See previous call. I have spoken with pt.

## 2017-04-02 NOTE — Telephone Encounter (Signed)
PT's sister, Eymen Hargraves called to find out what is going on with patient. I told her I need to speak with the pt. If he signs here that we can speak to members of his family we can speak with her. She will let pt know to call after his dialysis today.

## 2017-04-02 NOTE — Telephone Encounter (Signed)
Pt was returning a call to DS. Please call him back at [phone]

## 2017-04-15 ENCOUNTER — Ambulatory Visit: Payer: Medicare Other | Admitting: General Surgery

## 2017-04-15 ENCOUNTER — Encounter: Payer: Self-pay | Admitting: General Surgery

## 2017-04-15 VITALS — BP 135/63 | HR 81 | Temp 96.6°F | Ht 67.0 in | Wt 130.0 lb

## 2017-04-15 DIAGNOSIS — K624 Stenosis of anus and rectum: Secondary | ICD-10-CM

## 2017-04-15 NOTE — Progress Notes (Signed)
Charles Vasquez; [id number]; Aug 27, 1935   HPI Patient is a 82 year old black male with multiple medical problems who was referred to my care by Dr. Darrick Penna for evaluation and treatment of anal stenosis.  Patient and family state that he has had incontinence of stool for some time now.  He now currently wears a diaper.  He goes multiple times during the day.  His stools are loose.  He denies any rectal pain.  He denies any blood per rectum.  He underwent a colonoscopy by Dr. Darrick Penna which revealed anal narrowing. Past Medical History:  Diagnosis Date  . Diabetes mellitus without complication (HCC)    lantus daily, type2  . Dizziness   . ESRD (end stage renal disease) on dialysis (HCC)    "[facility]; MWF" (10/22/2016)  . History of blood transfusion   . Hypertension   . Myocardial infarction (HCC)   . Pneumonia    last yr  . Stroke [facility])     Past Surgical History:  Procedure Laterality Date  . AMPUTATION Right 01/01/2017   Procedure: REVISION AMPUTATION RIGHT INDEX FINGER;  Surgeon: Dairl Ponder, MD;  Location: [facility] OR;  Service: Orthopedics;  Laterality: Right;  . ARTERIOVENOUS GRAFT PLACEMENT    . ARTERIOVENOUS GRAFT PLACEMENT Left 10/22/2016   thigh  . AV FISTULA PLACEMENT  03/31/2012   Procedure: ARTERIOVENOUS (AV) FISTULA CREATION;  Surgeon: [name], MD;  Location: [facility] OR;  Service: Vascular;  Laterality: Right;  First stage Brachial vein transposition  . AV FISTULA PLACEMENT Right 08/25/2012   Procedure: INSERTION OF ARTERIOVENOUS (AV) GORE-TEX GRAFT ARM;  Surgeon: [name], MD;  Location: [facility] OR;  Service: Vascular;  Laterality: Right;  . AV FISTULA PLACEMENT Left 07/23/2016   Procedure: INSERTION OF ARTERIOVENOUS (AV) GORE-TEX GRAFT LEFT UPPER ARM;  Surgeon: [name], MD;  Location: [facility] OR;  Service: Vascular;  Laterality: Left;  . AV FISTULA PLACEMENT Left 10/22/2016   Procedure: INSERTION OF 4-37mm x 45cm  ARTERIOVENOUS (AV) GORE-TEX GRAFT THIGH-LEFT;  Surgeon: [name], MD;  Location: [facility] OR;  Service: Vascular;  Laterality: Left;  . COLONOSCOPY N/A 03/25/2017   Procedure: COLONOSCOPY;  Surgeon: West Bali, MD;  Location: [facility] ENDO SUITE;  Service: Endoscopy;  Laterality: N/A;  2:30pm  . ESOPHAGOGASTRODUODENOSCOPY N/A 03/25/2017   Procedure: ESOPHAGOGASTRODUODENOSCOPY (EGD);  Surgeon: West Bali, MD;  Location: [facility] ENDO SUITE;  Service: Endoscopy;  Laterality: N/A;  . EYE SURGERY Bilateral    cataracts  . INSERTION OF DIALYSIS CATHETER Left 05/05/2012   Procedure: INSERTION OF DIALYSIS CATHETER;  Surgeon: Chuck Hint, MD;  Location: [facility] OR;  Service: Vascular;  Laterality: Left;  . INSERTION OF DIALYSIS CATHETER Right 10/01/2016   Procedure: INSERTION OF DIALYSIS CATHETER- RIGHT FEMORAL;  Surgeon: [name], MD;  Location: [facility] OR;  Service: Vascular;  Laterality: Right;  . IR FLUORO GUIDE CV LINE RIGHT  06/10/2016  . IR GENERIC HISTORICAL  06/07/2016   IR US GUIDE VASC ACCESS RIGHT 06/07/2016 Oley Balm, MD [facility]-INTERV RAD  . IR THROMBECTOMY AV FISTULA W/THROMBOLYSIS/PTA INC/SHUNT/IMG RIGHT Right 06/07/2016  . IR US GUIDE VASC ACCESS RIGHT  06/10/2016  . LIGATION ARTERIOVENOUS GORTEX GRAFT Left 08/04/2016   Procedure: LIGATION ARTERIOVENOUS GORTEX GRAFT;  Surgeon: Larina Earthly, MD;  Location: [facility] OR;  Service: Vascular;  Laterality: Left;  . LIGATION ARTERIOVENOUS GORTEX GRAFT Right 11/26/2016   Procedure: LIGATION OF RIGHT UPPER ARM ARTERIOVENOUS GORTEX GRAFT;  Surgeon: [name], MD;  Location: [facility]  OR;  Service: Vascular;  Laterality: Right;  . LIGATION OF ARTERIOVENOUS  FISTULA Right 08/25/2012   Procedure: LIGATION OF ARTERIOVENOUS  FISTULA;  Surgeon: [name], MD;  Location: [facility] OR;  Service: Vascular;  Laterality: Right;  Ligation of right brachial vein transposition  . REMOVAL OF A DIALYSIS CATHETER Right 05/05/2012   Procedure: REMOVAL OF A DIALYSIS CATHETER;  Surgeon: Chuck Hint, MD;  Location: [facility] OR;  Service: Vascular;   Laterality: Right;  . REMOVAL OF A DIALYSIS CATHETER Right 10/01/2016   Procedure: REMOVAL OF A DIALYSIS CATHETER-RIGHT UPPER CHEST;  Surgeon: [name], MD;  Location: [facility] OR;  Service: Vascular;  Laterality: Right;  . REMOVAL OF A DIALYSIS CATHETER Right 11/26/2016   Procedure: REMOVAL OF RIGHT FEMORAL TUNNEL DIALYSIS CATHETER;  Surgeon: [name], MD;  Location: [facility] OR;  Service: Vascular;  Laterality: Right;  . TOE AMPUTATION    . UPPER EXTREMITY VENOGRAPHY Bilateral 07/18/2016   Procedure: Bilateral Upper Extremity Venography;  Surgeon: [name], MD;  Location: [facility] INVASIVE CV LAB;  Service: Cardiovascular;  Laterality: Bilateral;    Family History  Problem Relation Age of Onset  . Diabetes Mother   . Hypertension Mother   . Diabetes Father   . Hypertension Father     Current Outpatient Medications on File Prior to Visit  Medication Sig Dispense Refill  . aspirin EC 81 MG tablet Take 81 mg by mouth daily.    . calcium carbonate (TUMS - DOSED IN MG ELEMENTAL CALCIUM) 500 MG chewable tablet Chew 1 tablet by mouth daily as needed for indigestion or heartburn.    . carboxymethylcellulose (REFRESH PLUS) 0.5 % SOLN 1 drop 3 (three) times daily as needed. AS DIRECTED    . dicyclomine (BENTYL) 10 MG capsule 1 PO 30 MINUTES PRIOR TO BREAKFAST AND LUNCH 62 capsule 11  . docusate sodium (COLACE) 100 MG capsule Take 1 capsule (100 mg total) by mouth daily. 30 capsule 11  . insulin glargine (LANTUS) 100 UNIT/ML injection Inject 5 Units into the skin at bedtime.     Marland Kitchen labetalol (NORMODYNE) 300 MG tablet Take 300 mg by mouth 2 (two) times daily.     Marland Kitchen losartan (COZAAR) 50 MG tablet Take 50 mg by mouth daily.    . multivitamin (RENA-VIT) TABS tablet Take 1 tablet by mouth daily.    . pantoprazole (PROTONIX) 40 MG tablet 1 PO 30 MINUTES PRIOR TO MEALS BID FOR 3 MOS THEN QD 60 tablet 11  . Polysacch Fe Cmp-Fe Heme Poly (BIFERA) 28 MG TABS 1 po bid 60 tablet 11   No current  facility-administered medications on file prior to visit.     Allergies  Allergen Reactions  . Lisinopril Other (See Comments)    HYPERKALEMIA RENAL DYSFUNCTION PROGRESSION    Social History   Substance and Sexual Activity  Alcohol Use No    Social History   Tobacco Use  Smoking Status Never Smoker  Smokeless Tobacco Never Used    Review of Systems  Constitutional: Negative.   HENT: Negative.   Eyes: Negative.   Respiratory: Negative.   Cardiovascular: Negative.   Gastrointestinal: Negative.   Genitourinary: Negative.   Musculoskeletal: Positive for back pain.  Skin:       dry  Neurological: Positive for tremors.  Endo/Heme/Allergies: Negative.   Psychiatric/Behavioral: Negative.     Objective   Vitals:   04/15/17 1351  BP: 135/63  Pulse: 81  Temp: (!) 96.6 F (35.9 C)  Physical Exam  Constitutional: He is oriented to person, place, and time and well-developed, well-nourished, and in no distress.  HENT:  Head: Normocephalic and atraumatic.  Cardiovascular: Normal rate, regular rhythm and normal heart sounds. Exam reveals no gallop and no friction rub.  No murmur heard. Pulmonary/Chest: Effort normal and breath sounds normal. No respiratory distress. He has no wheezes. He has no rales.  Abdominal: Soft. Bowel sounds are normal. He exhibits no distension. There is no tenderness. There is no rebound.  Genitourinary:  Genitourinary Comments: Digital rectal examination allowed 1 index finger to pass easily into his anal canal.  This was uncomfortable for him.  When I asked him to bear down, there was no change in the rectal tone.  No blood was noted.  No masses noted.  Neurological: He is alert and oriented to person, place, and time.  Skin: Skin is warm and dry.  Vitals reviewed.  Colonoscopy report reviewed.  Assessment  Anal rectal disorder, possible neurologic deficit to anal sphincter Plan   Given the lack of rectal tone, I have recommended that  the patient see a colorectal specialist as this is outside my realm of the expertise.  I have made arrangements for the patient to be seen by Dr. Maisie Fus of [facility].  Patient and family understand and agree.  Follow-up here as needed.

## 2017-04-15 NOTE — Patient Instructions (Signed)
Anal Stricture  Anal stricture, also called anal stenosis, is a narrowing of the opening between the buttocks (anus). This condition makes bowel movements difficult or painful. Anal stricture can range from mild to severe.  What are the causes?  This condition may be caused by:   Surgery involving the anal canal, such as hemorrhoidectomy.   Abscesses or infections.   Inflammatory bowel disease (IBD), such as Crohn disease and ulcerative colitis.   Injury to the anus.   A tear or crack in the skin around the anus (anal fissure).   Radiation therapy.   An STD (sexually transmitted disease).   Tuberculosis.   Overuse of medicines that help you have a bowel movement (laxatives).    What are the signs or symptoms?  Symptoms of this condition include:   Pain and pressure during bowel movements.   Bleeding during bowel movements.   Trouble getting stool out.   Constipation.   Discomfort in the anus.   Narrow stools.    How is this diagnosed?  This condition may be diagnosed based on:   Your medical history.   An exam of your anal canal. During the exam, your health care provider will feel the inside of your rectum.   A test called an anorectal manometry. During this test a small balloon attached to a flexible tube is used to check how your anus is working.    How is this treated?  Treatment for this condition depends on the cause and severity of the condition. For mild to moderate stricture, nonsurgical treatments are usually successful. Treatments for this condition may involve:   Diet changes. You may need to drink more fluid, eat more high-fiber foods, or take fiber supplements.   Stool softeners.   Techniques to stretch the opening of the anus (dilation techniques). These may be performed by a health care provider, or you may need to do them at home.   Surgery. This may be done in severe cases or if other treatments do not help.Surgerymay involve:  ? Sphincterotomy. This is a surgery on a muscle  in the anus.  ? Anoplasty. This is a surgery to reconstruct the anus.    Follow these instructions at home:  Diet     Make any diet changes that your health care provider recommends.   Eat foods that are high in fiber, such as fruits, vegetables, whole grains, and beans.   Drink enough fluid to keep your urine clear or pale yellow.  General instructions   Take over-the-counter and prescription medicines only as told by your health care provider.   Practice good hygiene to reduce your risk of infection.   Follow instructions from your health care provider about how and when to perform any dilation techniques.   Keep all follow-up visits as told by your health care provider. This is important.  Contact a health care provider if:   You have more pain or bleeding during bowel movements, even after treatment.   You have trouble having a bowel movement.   You are unable to have a bowel movement.  Get help right away if:   You have a fever and your symptoms suddenly get worse.   You have black stools.  This information is not intended to replace advice given to you by your health care provider. Make sure you discuss any questions you have with your health care provider.  Document Released: 06/22/2012 Document Revised: 10/23/2015 Document Reviewed: 08/29/2015  Elsevier Interactive Patient Education    2017 [employer]

## 2017-04-22 ENCOUNTER — Ambulatory Visit: Payer: Medicare Other | Admitting: Gastroenterology

## 2017-05-06 ENCOUNTER — Ambulatory Visit: Payer: Self-pay | Admitting: General Surgery

## 2017-05-06 NOTE — H&P (Signed)
History of Present Illness (Landon Bassford MD; 05/06/2017 11:37 AM) The patient is a 82 year old male who presents with anal pain. 82-year-old male who presents to the office for evaluation of worsening anal pain. He also is having fecal incontinence. The symptoms have been getting worse over the past few months. He recently underwent a colonoscopy which was normal except for some fissuring of the anal canal. He denies any surgical history within his anal canal and no anal trauma in the past.   Past Surgical History ([name], RMA; 05/06/2017 11:19 AM) Cataract Surgery Bilateral. Dialysis Shunt / Fistula Foot Surgery Left.  Diagnostic Studies History ([name], RMA; 05/06/2017 11:19 AM) Colonoscopy within last year  Allergies ([name], RMA; 05/06/2017 11:20 AM) No Known Drug Allergies [05/06/2017]: Allergies Reconciled  Medication History ([name], RMA; 05/06/2017 11:23 AM) Aspirin (81MG Tablet, Oral) Active. Calcium Carbonate (500MG Tablet, Oral) Active. Bentyl (10MG Capsule, Oral) Active. Colace (100MG Capsule, Oral) Active. Lantus (100UNIT/ML Solution, Subcutaneous) Active. Normodyne (300MG Tablet, Oral) Active. Losartan Potassium (50MG Tablet, Oral) Active. Multi-Vitamin (Oral) Active. Protonix (40MG Packet, Oral) Active. Medications Reconciled  Social History ([name], RMA; 05/06/2017 11:19 AM) Caffeine use Carbonated beverages. No drug use Tobacco use Never smoker.  Family History ([name], RMA; 05/06/2017 11:19 AM) Cancer Brother. Cerebrovascular Accident Family Members In General, Mother. Diabetes Mellitus Brother, Father, Mother. Heart Disease Father, Son. Hypertension Brother, Family Members In General, Father, Mother. Seizure disorder Mother.  Other Problems ([name], RMA; 05/06/2017 11:19 AM) Cerebrovascular Accident Chronic Renal Failure Syndrome Diabetes  Mellitus Gastroesophageal Reflux Disease High blood pressure Seizure Disorder Vascular Disease     Review of Systems ([name] RMA; 05/06/2017 11:19 AM) General Present- Fatigue and Weight Loss. Not Present- Appetite Loss, Chills, Fever, Night Sweats and Weight Gain. Skin Present- Dryness and Non-Healing Wounds. Not Present- Change in Wart/Mole, Hives, Jaundice, New Lesions, Rash and Ulcer. Respiratory Present- Snoring. Not Present- Bloody sputum, Chronic Cough, Difficulty Breathing and Wheezing. Breast Not Present- Breast Mass, Breast Pain, Nipple Discharge and Skin Changes. Cardiovascular Present- Leg Cramps and Swelling of Extremities. Not Present- Chest Pain, Difficulty Breathing Lying Down, Palpitations, Rapid Heart Rate and Shortness of Breath. Gastrointestinal Present- Abdominal Pain, Bloody Stool, Change in Bowel Habits, Chronic diarrhea, Constipation, Nausea, Rectal Pain and Vomiting. Not Present- Bloating, Difficulty Swallowing, Excessive gas, Gets full quickly at meals, Hemorrhoids and Indigestion. Male Genitourinary Not Present- Blood in Urine, Change in Urinary Stream, Frequency, Impotence, Nocturia, Painful Urination, Urgency and Urine Leakage. Musculoskeletal Present- Muscle Weakness. Not Present- Back Pain, Joint Pain, Joint Stiffness, Muscle Pain and Swelling of Extremities. Neurological Present- Trouble walking and Weakness. Not Present- Decreased Memory, Fainting, Headaches, Numbness, Seizures, Tingling and Tremor. Psychiatric Not Present- Anxiety, Bipolar, Change in Sleep Pattern, Depression, Fearful and Frequent crying. Endocrine Present- Cold Intolerance. Not Present- Excessive Hunger, Hair Changes, Heat Intolerance, Hot flashes and New Diabetes.  Vitals ([name] RMA; 05/06/2017 11:20 AM) 05/06/2017 11:19 AM Weight: 133.2 lb Height: 67in Body Surface Area: 1.7 m Body Mass Index: 20.86 kg/m  Temp.: 98.6F  Pulse: 78 (Regular)  BP:  144/74 (Sitting, Left Arm, Standard)      Physical Exam (Delvin Hedeen MD; 05/06/2017 11:33 AM)  General Mental Status-Alert. General Appearance-Not in acute distress. Build & Nutrition-Well nourished. Posture-Normal posture. Gait-Normal.  Head and Neck Head-normocephalic, atraumatic with no lesions or palpable masses. Trachea-midline.  Chest and Lung Exam Chest and lung exam reveals -on auscultation, normal breath sounds,   no adventitious sounds and normal vocal resonance.  Cardiovascular Cardiovascular examination reveals -normal heart sounds, regular rate and rhythm with no murmurs and no digital clubbing, cyanosis, edema, increased warmth or tenderness.  Abdomen Inspection Inspection of the abdomen reveals - No Hernias. Palpation/Percussion Palpation and Percussion of the abdomen reveal - Soft, Non Tender, No Rigidity (guarding), No hepatosplenomegaly and No Palpable abdominal masses.  Rectal Anorectal Exam External - normal external exam. Internal - increased sphincter tone.  Neurologic Neurologic evaluation reveals -alert and oriented x 3 with no impairment of recent or remote memory, normal attention span and ability to concentrate, normal sensation and normal coordination.  Musculoskeletal Normal Exam - Bilateral-Upper Extremity Strength Normal and Lower Extremity Strength Normal.    Assessment & Plan (Jaise Moser MD; 05/06/2017 11:42 AM)  ANAL PAIN (K62.89) Impression: 82 yo M with anal pain. I think this is most likely due to a fissure. I would like to perform an EUA and possible chemical sphincterotomy. Hopefully if we can get his anal pain better, he will be able to work on firming up his stool so that he can have more continence. He is aware that this will most likely make his incontinence worse temporarily. 

## 2017-05-06 NOTE — H&P (View-Only) (Signed)
History of Present Illness Charles Vasquez; 05/06/2017 11:37 AM) The patient is a 82 year old male who presents with anal pain. 82 year old male who presents to the office for evaluation of worsening anal pain. He also is having fecal incontinence. The symptoms have been getting worse over the past few months. He recently underwent a colonoscopy which was normal except for some fissuring of the anal canal. He denies any surgical history within his anal canal and no anal trauma in the past.   Past Surgical History (Charles Vasquez, Vasquez; 05/06/2017 11:19 AM) Cataract Surgery Bilateral. Dialysis Shunt / Fistula Foot Surgery Left.  Diagnostic Studies History (Charles Vasquez, Vasquez; 05/06/2017 11:19 AM) Colonoscopy within last year  Allergies (Charles Vasquez, Vasquez; 05/06/2017 11:20 AM) No Known Drug Allergies [05/06/2017]: Allergies Reconciled  Medication History (Charles Vasquez, Vasquez; 05/06/2017 11:23 AM) Aspirin (81MG  Tablet, Oral) Active. Calcium Carbonate (500MG  Tablet, Oral) Active. Bentyl (10MG  Capsule, Oral) Active. Colace (100MG  Capsule, Oral) Active. Lantus (100UNIT/ML Solution, Subcutaneous) Active. Normodyne (300MG  Tablet, Oral) Active. Losartan Potassium (50MG  Tablet, Oral) Active. Multi-Vitamin (Oral) Active. Protonix (40MG  Packet, Oral) Active. Medications Reconciled  Social History (Charles Vasquez, Vasquez; 05/06/2017 11:19 AM) Caffeine use Carbonated beverages. No drug use Tobacco use Never smoker.  Family History (Charles Vasquez, Vasquez; 05/06/2017 11:19 AM) Cancer Brother. Cerebrovascular Accident Family Members In General, Mother. Diabetes Mellitus Brother, Father, Mother. Heart Disease Father, Son. Hypertension Brother, Family Members In [place], Father, Mother. Seizure disorder Mother.  Other Problems (Charles Vasquez, Vasquez; 05/06/2017 11:19 AM) Cerebrovascular Accident Chronic Renal Failure Syndrome Diabetes  Mellitus Gastroesophageal Reflux Disease High blood pressure Seizure Disorder Vascular Disease     Review of Systems (Charles Vasquez; 05/06/2017 11:19 AM) General Present- Fatigue and Weight Loss. Not Present- Appetite Loss, Chills, Fever, Night Sweats and Weight Gain. Skin Present- Dryness and Non-Healing Wounds. Not Present- Change in Wart/Mole, Hives, Jaundice, New Lesions, Rash and Ulcer. Respiratory Present- Snoring. Not Present- Bloody sputum, Chronic Cough, Difficulty Breathing and Wheezing. Breast Not Present- Breast Mass, Breast Pain, Nipple Discharge and Skin Changes. Cardiovascular Present- Leg Cramps and Swelling of Extremities. Not Present- Chest Pain, Difficulty Breathing Lying Down, Palpitations, Rapid Heart Rate and Shortness of Breath. Gastrointestinal Present- Abdominal Pain, Bloody Stool, Change in Bowel Habits, Chronic diarrhea, Constipation, Nausea, Rectal Pain and Vomiting. Not Present- Bloating, Difficulty Swallowing, Excessive gas, Gets full quickly at meals, Hemorrhoids and Indigestion. Male Genitourinary Not Present- Blood in Urine, Change in Urinary Stream, Frequency, Impotence, Nocturia, Painful Urination, Urgency and Urine Leakage. Musculoskeletal Present- Muscle Weakness. Not Present- Back Pain, Joint Pain, Joint Stiffness, Muscle Pain and Swelling of Extremities. Neurological Present- Trouble walking and Weakness. Not Present- Decreased Memory, Fainting, Headaches, Numbness, Seizures, Tingling and Tremor. Psychiatric Not Present- Anxiety, Bipolar, Change in Sleep Pattern, Depression, Fearful and Frequent crying. Endocrine Present- Cold Intolerance. Not Present- Excessive Hunger, Hair Changes, Heat Intolerance, Hot flashes and New Diabetes.  Vitals (Charles Vasquez; 05/06/2017 11:20 AM) 05/06/2017 11:19 AM Weight: 133.2 lb Height: 67in Body Surface Area: 1.7 m Body Mass Index: 20.86 kg/m  Temp.: 98.73F  Pulse: 78 (Regular)  BP:  144/74 (Sitting, Left Arm, Standard)      Physical Exam Charles Vasquez; 05/06/2017 11:33 AM)  General Mental Status-Alert. General Appearance-Not in acute distress. Build & Nutrition-Well nourished. Posture-Normal posture. Gait-Normal.  Head and Neck Head-normocephalic, atraumatic with no lesions or palpable masses. Trachea-midline.  Chest and Lung Exam Chest and lung exam reveals -on auscultation, normal breath sounds,  no adventitious sounds and normal vocal resonance.  Cardiovascular Cardiovascular examination reveals -normal heart sounds, regular rate and rhythm with no murmurs and no digital clubbing, cyanosis, edema, increased warmth or tenderness.  Abdomen Inspection Inspection of the abdomen reveals - No Hernias. Palpation/Percussion Palpation and Percussion of the abdomen reveal - Soft, Non Tender, No Rigidity (guarding), No hepatosplenomegaly and No Palpable abdominal masses.  Rectal Anorectal Exam External - normal external exam. Internal - increased sphincter tone.  Neurologic Neurologic evaluation reveals -alert and oriented x 3 with no impairment of recent or remote memory, normal attention span and ability to concentrate, normal sensation and normal coordination.  Musculoskeletal Normal Exam - Bilateral-Upper Extremity Strength Normal and Lower Extremity Strength Normal.    Assessment & Plan Charles Vasquez; 05/06/2017 11:42 AM)  ANAL PAIN (K62.89) Impression: 82 yo M with anal pain. I think this is most likely due to a fissure. I would like to perform an EUA and possible chemical sphincterotomy. Hopefully if we can get his anal pain better, he will be able to work on firming up his stool so that he can have more continence. He is aware that this will most likely make his incontinence worse temporarily.

## 2017-05-09 ENCOUNTER — Encounter (HOSPITAL_BASED_OUTPATIENT_CLINIC_OR_DEPARTMENT_OTHER): Payer: Self-pay | Admitting: *Deleted

## 2017-05-13 ENCOUNTER — Other Ambulatory Visit: Payer: Self-pay

## 2017-05-13 ENCOUNTER — Encounter (HOSPITAL_BASED_OUTPATIENT_CLINIC_OR_DEPARTMENT_OTHER): Payer: Self-pay | Admitting: *Deleted

## 2017-05-13 NOTE — Progress Notes (Signed)
SPOKE W/ PT AND HIS GIRLFRIEND, Collene Leyden ,VIA PHONE FOR PRE-OP INTERVIEW.  ARRIVE AT 0830.  NEEDS ISTAT 8.  CURRENT EKG IN CHART AND Epic.  PER PT CURRENT AVF ACCESS USED IS LEFT THIGH. GOES TO [facility] IN [place], [place].

## 2017-05-15 ENCOUNTER — Encounter (HOSPITAL_BASED_OUTPATIENT_CLINIC_OR_DEPARTMENT_OTHER)
Admission: RE | Disposition: A | Discharge status: Home / Self Care | Payer: Self-pay | Source: Ambulatory Visit | Attending: General Surgery

## 2017-05-15 ENCOUNTER — Ambulatory Visit (HOSPITAL_BASED_OUTPATIENT_CLINIC_OR_DEPARTMENT_OTHER): Payer: Medicare Other | Admitting: Anesthesiology

## 2017-05-15 ENCOUNTER — Ambulatory Visit (HOSPITAL_BASED_OUTPATIENT_CLINIC_OR_DEPARTMENT_OTHER)
Admission: RE | Admit: 2017-05-15 | Discharge: 2017-05-15 | Disposition: A | Discharge status: Home / Self Care | Payer: Medicare Other | Source: Ambulatory Visit | Attending: General Surgery | Admitting: General Surgery

## 2017-05-15 ENCOUNTER — Encounter (HOSPITAL_BASED_OUTPATIENT_CLINIC_OR_DEPARTMENT_OTHER): Payer: Self-pay

## 2017-05-15 DIAGNOSIS — I10 Essential (primary) hypertension: Secondary | ICD-10-CM | POA: Diagnosis not present

## 2017-05-15 DIAGNOSIS — Z7982 Long term (current) use of aspirin: Secondary | ICD-10-CM | POA: Insufficient documentation

## 2017-05-15 DIAGNOSIS — Z8673 Personal history of transient ischemic attack (TIA), and cerebral infarction without residual deficits: Secondary | ICD-10-CM | POA: Insufficient documentation

## 2017-05-15 DIAGNOSIS — R159 Full incontinence of feces: Secondary | ICD-10-CM | POA: Insufficient documentation

## 2017-05-15 DIAGNOSIS — Z794 Long term (current) use of insulin: Secondary | ICD-10-CM | POA: Insufficient documentation

## 2017-05-15 DIAGNOSIS — E119 Type 2 diabetes mellitus without complications: Secondary | ICD-10-CM | POA: Insufficient documentation

## 2017-05-15 DIAGNOSIS — K219 Gastro-esophageal reflux disease without esophagitis: Secondary | ICD-10-CM | POA: Diagnosis not present

## 2017-05-15 DIAGNOSIS — Z79899 Other long term (current) drug therapy: Secondary | ICD-10-CM | POA: Insufficient documentation

## 2017-05-15 DIAGNOSIS — G40909 Epilepsy, unspecified, not intractable, without status epilepticus: Secondary | ICD-10-CM | POA: Insufficient documentation

## 2017-05-15 DIAGNOSIS — K6289 Other specified diseases of anus and rectum: Secondary | ICD-10-CM | POA: Insufficient documentation

## 2017-05-15 DIAGNOSIS — K602 Anal fissure, unspecified: Secondary | ICD-10-CM | POA: Diagnosis present

## 2017-05-15 HISTORY — PX: SPHINCTEROTOMY: SHX5279

## 2017-05-15 HISTORY — DX: Long term (current) use of insulin: Z79.4

## 2017-05-15 HISTORY — DX: Hyperlipidemia, unspecified: E78.5

## 2017-05-15 HISTORY — DX: Arteriovenous fistula, acquired: I77.0

## 2017-05-15 HISTORY — DX: Type 2 diabetes mellitus without complications: E11.9

## 2017-05-15 HISTORY — DX: Other specified diseases of anus and rectum: K62.89

## 2017-05-15 HISTORY — DX: Personal history of transient ischemic attack (TIA), and cerebral infarction without residual deficits: Z86.73

## 2017-05-15 LAB — POCT I-STAT, CHEM 8
BUN: 37 mg/dL — AB (ref 6–20)
CHLORIDE: 96 mmol/L — AB (ref 101–111)
CREATININE: 6.7 mg/dL — AB (ref 0.61–1.24)
Calcium, Ion: 1.02 mmol/L — ABNORMAL LOW (ref 1.15–1.40)
Glucose, Bld: 198 mg/dL — ABNORMAL HIGH (ref 65–99)
HEMATOCRIT: 36 % — AB (ref 39.0–52.0)
Hemoglobin: 12.2 g/dL — ABNORMAL LOW (ref 13.0–17.0)
Potassium: 4.5 mmol/L (ref 3.5–5.1)
SODIUM: 135 mmol/L (ref 135–145)
TCO2: 25 mmol/L (ref 22–32)

## 2017-05-15 LAB — GLUCOSE, CAPILLARY: Glucose-Capillary: 191 mg/dL — ABNORMAL HIGH (ref 65–99)

## 2017-05-15 SURGERY — EXAM UNDER ANESTHESIA
Anesthesia: Monitor Anesthesia Care | Site: Rectum

## 2017-05-15 MED ORDER — SODIUM CHLORIDE 0.9 % IV SOLN
INTRAVENOUS | Status: DC
  Administered 2017-05-15: 09:00:00 via INTRAVENOUS
  Filled 2017-05-15: qty 1000

## 2017-05-15 MED ORDER — LIDOCAINE 2% (20 MG/ML) 5 ML SYRINGE
INTRAMUSCULAR | Status: AC
  Filled 2017-05-15: qty 5

## 2017-05-15 MED ORDER — ONDANSETRON HCL 4 MG/2ML IJ SOLN
4.0000 mg | Freq: Four times a day (QID) | INTRAMUSCULAR | Status: DC | PRN
  Filled 2017-05-15: qty 2

## 2017-05-15 MED ORDER — PROPOFOL 10 MG/ML IV BOLUS
INTRAVENOUS | Status: DC | PRN
  Administered 2017-05-15: 20 mg via INTRAVENOUS

## 2017-05-15 MED ORDER — PROPOFOL 10 MG/ML IV BOLUS
INTRAVENOUS | Status: AC
  Filled 2017-05-15: qty 20

## 2017-05-15 MED ORDER — ACETAMINOPHEN 500 MG PO TABS
ORAL_TABLET | ORAL | Status: AC
  Filled 2017-05-15: qty 2

## 2017-05-15 MED ORDER — ACETAMINOPHEN 500 MG PO TABS
1000.0000 mg | ORAL_TABLET | ORAL | Status: AC
  Administered 2017-05-15: 1000 mg via ORAL
  Filled 2017-05-15: qty 2

## 2017-05-15 MED ORDER — PROPOFOL 500 MG/50ML IV EMUL
INTRAVENOUS | Status: DC | PRN
  Administered 2017-05-15: 25 ug/kg/min via INTRAVENOUS

## 2017-05-15 MED ORDER — TRAMADOL HCL 50 MG PO TABS: 50.0000 mg | ORAL_TABLET | Freq: Four times a day (QID) | ORAL | 0 refills | Status: DC | PRN

## 2017-05-15 MED ORDER — FENTANYL CITRATE (PF) 100 MCG/2ML IJ SOLN
INTRAMUSCULAR | Status: AC
  Filled 2017-05-15: qty 2

## 2017-05-15 MED ORDER — SODIUM CHLORIDE 0.9% FLUSH
3.0000 mL | Freq: Two times a day (BID) | INTRAVENOUS | Status: DC
  Filled 2017-05-15: qty 3

## 2017-05-15 MED ORDER — ACETAMINOPHEN 650 MG RE SUPP
650.0000 mg | RECTAL | Status: DC | PRN
  Filled 2017-05-15: qty 1

## 2017-05-15 MED ORDER — BUPIVACAINE-EPINEPHRINE 0.5% -1:200000 IJ SOLN
INTRAMUSCULAR | Status: DC | PRN
  Administered 2017-05-15: 30 mL

## 2017-05-15 MED ORDER — PROPOFOL 10 MG/ML IV BOLUS
INTRAVENOUS | Status: AC
  Filled 2017-05-15: qty 40

## 2017-05-15 MED ORDER — OXYCODONE HCL 5 MG/5ML PO SOLN
5.0000 mg | Freq: Once | ORAL | Status: DC | PRN
  Filled 2017-05-15: qty 5

## 2017-05-15 MED ORDER — ACETAMINOPHEN 325 MG PO TABS
650.0000 mg | ORAL_TABLET | ORAL | Status: DC | PRN
  Filled 2017-05-15: qty 2

## 2017-05-15 MED ORDER — ONABOTULINUMTOXINA 100 UNITS IJ SOLR
INTRAMUSCULAR | Status: DC | PRN
  Administered 2017-05-15: 100 [IU] via INTRAMUSCULAR

## 2017-05-15 MED ORDER — SODIUM CHLORIDE 0.9 % IV SOLN
250.0000 mL | INTRAVENOUS | Status: DC | PRN
  Filled 2017-05-15: qty 250

## 2017-05-15 MED ORDER — SODIUM CHLORIDE 0.9% FLUSH
3.0000 mL | INTRAVENOUS | Status: DC | PRN
  Filled 2017-05-15: qty 3

## 2017-05-15 MED ORDER — OXYCODONE HCL 5 MG PO TABS
5.0000 mg | ORAL_TABLET | Freq: Once | ORAL | Status: DC | PRN
  Filled 2017-05-15: qty 1

## 2017-05-15 MED ORDER — FENTANYL CITRATE (PF) 100 MCG/2ML IJ SOLN
25.0000 ug | INTRAMUSCULAR | Status: DC | PRN
  Filled 2017-05-15: qty 1

## 2017-05-15 SURGICAL SUPPLY — 55 items
BENZOIN TINCTURE PRP APPL 2/3 (GAUZE/BANDAGES/DRESSINGS) ×6 IMPLANT
BLADE EXTENDED COATED 6.5IN (ELECTRODE) IMPLANT
BLADE HEX COATED 2.75 (ELECTRODE) ×3 IMPLANT
BLADE SURG 10 STRL SS (BLADE) IMPLANT
BLADE SURG 15 STRL LF DISP TIS (BLADE) ×1 IMPLANT
BLADE SURG 15 STRL SS (BLADE) ×2
BRIEF STRETCH FOR OB PAD LRG (UNDERPADS AND DIAPERS) ×6 IMPLANT
CANISTER SUCT 3000ML PPV (MISCELLANEOUS) ×3 IMPLANT
COVER BACK TABLE 60X90IN (DRAPES) ×3 IMPLANT
COVER MAYO STAND STRL (DRAPES) ×3 IMPLANT
DECANTER SPIKE VIAL GLASS SM (MISCELLANEOUS) ×3 IMPLANT
DRAPE LAPAROTOMY 100X72 PEDS (DRAPES) ×3 IMPLANT
DRAPE UTILITY XL STRL (DRAPES) ×3 IMPLANT
ELECT REM PT RETURN 9FT ADLT (ELECTROSURGICAL) ×3
ELECTRODE REM PT RTRN 9FT ADLT (ELECTROSURGICAL) ×1 IMPLANT
GAUZE SPONGE 4X4 12PLY STRL (GAUZE/BANDAGES/DRESSINGS) ×3 IMPLANT
GAUZE SPONGE 4X4 16PLY XRAY LF (GAUZE/BANDAGES/DRESSINGS) IMPLANT
GLOVE BIO SURGEON STRL SZ 6.5 (GLOVE) ×2 IMPLANT
GLOVE BIO SURGEONS STRL SZ 6.5 (GLOVE) ×1
GLOVE BIOGEL PI IND STRL 7.0 (GLOVE) ×1 IMPLANT
GLOVE BIOGEL PI INDICATOR 7.0 (GLOVE) ×2
GLOVE INDICATOR 7.0 STRL GRN (GLOVE) ×3 IMPLANT
GOWN SPEC L3 XXLG W/TWL (GOWN DISPOSABLE) ×3 IMPLANT
GOWN STRL REUS W/TWL 2XL LVL3 (GOWN DISPOSABLE) ×3 IMPLANT
HYDROGEN PEROXIDE 16OZ (MISCELLANEOUS) ×3 IMPLANT
KIT TURNOVER CYSTO (KITS) ×3 IMPLANT
LOOP VESSEL MAXI BLUE (MISCELLANEOUS) IMPLANT
NDL SAFETY ECLIPSE 18X1.5 (NEEDLE) IMPLANT
NEEDLE HYPO 18GX1.5 SHARP (NEEDLE)
NEEDLE HYPO 22GX1.5 SAFETY (NEEDLE) ×3 IMPLANT
NS IRRIG 500ML POUR BTL (IV SOLUTION) ×3 IMPLANT
PACK BASIN DAY SURGERY FS (CUSTOM PROCEDURE TRAY) ×3 IMPLANT
PAD ABD 8X10 STRL (GAUZE/BANDAGES/DRESSINGS) ×3 IMPLANT
PAD ARMBOARD 7.5X6 YLW CONV (MISCELLANEOUS) IMPLANT
PENCIL BUTTON HOLSTER BLD 10FT (ELECTRODE) ×3 IMPLANT
SPONGE HEMORRHOID 8X3CM (HEMOSTASIS) IMPLANT
SPONGE SURGIFOAM ABS GEL 12-7 (HEMOSTASIS) IMPLANT
SUCTION FRAZIER HANDLE 10FR (MISCELLANEOUS)
SUCTION TUBE FRAZIER 10FR DISP (MISCELLANEOUS) IMPLANT
SUT CHROMIC 2 0 SH (SUTURE) IMPLANT
SUT CHROMIC 3 0 SH 27 (SUTURE) IMPLANT
SUT ETHIBOND 0 (SUTURE) IMPLANT
SUT VIC AB 2-0 SH 27 (SUTURE)
SUT VIC AB 2-0 SH 27XBRD (SUTURE) IMPLANT
SUT VIC AB 3-0 SH 18 (SUTURE) IMPLANT
SUT VIC AB 4-0 P-3 18XBRD (SUTURE) IMPLANT
SUT VIC AB 4-0 P3 18 (SUTURE)
SYR CONTROL 10ML LL (SYRINGE) ×3 IMPLANT
TOWEL OR 17X24 6PK STRL BLUE (TOWEL DISPOSABLE) ×3 IMPLANT
TRAY DSU PREP LF (CUSTOM PROCEDURE TRAY) ×3 IMPLANT
TUBE CONNECTING 12'X1/4 (SUCTIONS) ×1
TUBE CONNECTING 12X1/4 (SUCTIONS) ×2 IMPLANT
UNDERPAD 30X30 (UNDERPADS AND DIAPERS) ×3 IMPLANT
WATER STERILE IRR 500ML POUR (IV SOLUTION) ×3 IMPLANT
YANKAUER SUCT BULB TIP NO VENT (SUCTIONS) ×3 IMPLANT

## 2017-05-15 NOTE — Anesthesia Preprocedure Evaluation (Signed)
Anesthesia Evaluation  Patient identified by MRN, date of birth, ID band Patient awake    Reviewed: Allergy & Precautions, H&P , NPO status , Patient's Chart, lab work & pertinent test results  Airway Mallampati: II   Neck ROM: full    Dental   Pulmonary neg pulmonary ROS,    breath sounds clear to auscultation       Cardiovascular hypertension,  Rhythm:regular Rate:Normal     Neuro/Psych CVA    GI/Hepatic GERD  ,  Endo/Other  diabetes, Type 2  Renal/GU ESRF and DialysisRenal disease     Musculoskeletal  (+) Arthritis ,   Abdominal   Peds  Hematology   Anesthesia Other Findings   Reproductive/Obstetrics                             Anesthesia Physical Anesthesia Plan  ASA: III  Anesthesia Plan: MAC   Post-op Pain Management:    Induction: Intravenous  PONV Risk Score and Plan: 1 and Ondansetron, Propofol infusion and Treatment may vary due to age or medical condition  Airway Management Planned: Simple Face Mask  Additional Equipment:   Intra-op Plan:   Post-operative Plan:   Informed Consent: I have reviewed the patients History and Physical, chart, labs and discussed the procedure including the risks, benefits and alternatives for the proposed anesthesia with the patient or authorized representative who has indicated his/her understanding and acceptance.     Plan Discussed with: CRNA, Anesthesiologist and Surgeon  Anesthesia Plan Comments:         Anesthesia Quick Evaluation

## 2017-05-15 NOTE — Op Note (Signed)
05/15/2017  11:22 AM  PATIENT:  Charles Vasquez  82 y.o. male  Patient Care Team: Neale Burly, MD as PCP - General (Unknown Physician Specialty) Fran Lowes, MD as Consulting Physician (Nephrology) Danie Binder, MD as Consulting Physician (Gastroenterology)  PRE-OPERATIVE DIAGNOSIS:  ANAL PAIN  Vasquez-OPERATIVE DIAGNOSIS:  ANAL FISSURE  PROCEDURE:   ANAL EXAM UNDER ANESTHESIA CHEMICAL SPHINCTEROTOMY (BOTOX)   Surgeon(s): Leighton Ruff, MD  ASSISTANT: none   ANESTHESIA:   local and MAC  SPECIMEN:  No Specimen  DISPOSITION OF SPECIMEN:  N/A  COUNTS:  YES  PLAN OF CARE: Discharge to home after PACU  PATIENT DISPOSITION:  PACU - hemodynamically stable.  INDICATION: 82 year old male who presents to the office with chronic anal pain.  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  I recommended an exam under anesthesia as I was unable to get a good exam in the office.   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  MAC anesthesia was induced without difficulty. The patient was then positioned in prone jackknife position with buttocks gently taped apart.  The patient was then prepped and draped in usual sterile fashion.  SCDs were noted to be in place prior to the initiation of anesthesia. A surgical timeout was performed indicating the correct patient, procedure, positioning and need for preoperative antibiotics.  A rectal block was performed using Marcaine with epinephrine.    I began with a digital rectal exam.  The patient has significant sphincter hypertension and resting tone.  I gently dilated to 1 fingerbreadth.  There were no masses noted within the anal canal or distal rectum.  There was no fluctuance or fistulas noted.  I then placed a small Hill-Ferguson anoscope into the anal canal and  evaluated this completely.   Patient did have a large anal fissure at posterior midline.  I injected 100 units of Botox into the intersphincteric groove.  The patient tolerated this well.  He was awakened from anesthesia and sent to the postanesthesia care unit stable condition.  All counts were correct per operating room staff.

## 2017-05-15 NOTE — Transfer of Care (Signed)
Immediate Anesthesia Transfer of Care Note  Patient: Charles Vasquez  Procedure(s) Performed: ANAL EXAM UNDER ANESTHESIA (N/A Rectum) POSSIBLE SPHINCTEROTOMY (BOTOX) (N/A Rectum)  Patient Location: PACU  Anesthesia Type:MAC  Level of Consciousness: drowsy and patient cooperative  Airway & Oxygen Therapy: Patient Spontanous Breathing  Post-op Assessment: Report given to RN and Post -op Vital signs reviewed and stable  Post vital signs: Reviewed and stable  Last Vitals:  Vitals:   05/15/17 0820  BP: (!) 100/59  Pulse: 66  Resp: 16  Temp: 36.6 C  SpO2: 93%    Last Pain:  Vitals:   05/15/17 0820  TempSrc: Oral      Patients Stated Pain Goal: 8 (09/98/33 8250)  Complications: No apparent anesthesia complications

## 2017-05-15 NOTE — Interval H&P Note (Signed)
History and Physical Interval Note:  05/15/2017 10:55 AM  Charles Vasquez  has presented today for surgery, with the diagnosis of ANAL PAIN  The various methods of treatment have been discussed with the patient and family. After consideration of risks, benefits and other options for treatment, the patient has consented to  Procedure(s): ANAL EXAM UNDER ANESTHESIA (N/A) POSSIBLE SPHINCTEROTOMY (BOTOX) (N/A) as a surgical intervention .  The patient's history has been reviewed, patient examined, no change in status, stable for surgery.  I have reviewed the patient's chart and labs.  Questions were answered to the patient's satisfaction.     Vanita Panda, MD  Colorectal and General Surgery [facility]

## 2017-05-15 NOTE — Anesthesia Postprocedure Evaluation (Signed)
Anesthesia Post Note  Patient: Charles Vasquez  Procedure(s) Performed: ANAL EXAM UNDER ANESTHESIA (N/A Rectum) POSSIBLE SPHINCTEROTOMY (BOTOX) (N/A Rectum)     Patient location during evaluation: PACU Anesthesia Type: MAC Level of consciousness: awake and alert Pain management: pain level controlled Vital Signs Assessment: post-procedure vital signs reviewed and stable Respiratory status: spontaneous breathing, nonlabored ventilation, respiratory function stable and patient connected to nasal cannula oxygen Cardiovascular status: stable and blood pressure returned to baseline Postop Assessment: no apparent nausea or vomiting Anesthetic complications: no    Last Vitals:  Vitals:   05/15/17 1215 05/15/17 1230  BP: (!) 166/62 (!) 157/58  Pulse: 62 65  Resp: 15 (!) 21  Temp:    SpO2: 100% 97%    Last Pain:  Vitals:   05/15/17 0820  TempSrc: Oral                 Shlonda Dolloff S

## 2017-05-15 NOTE — Discharge Instructions (Addendum)
Beginning the day after surgery:  You may sit in a tub of warm water or sitz bath 2-3 times a day to relieve discomfort.  Eat a regular diet high in fiber.  Avoid foods that give you constipation or diarrhea.  Avoid foods that are difficult to digest, such as seeds, nuts, corn or popcorn.  Do not go any longer than 2 days without a bowel movement.  You may take a dose of Milk of Magnesia if you become constipated.    Drink 6-8 glasses of water daily.  Walking is encouraged.  Avoid strenuous activity and heavy lifting for one month after surgery.    Call the office if you have any questions or concerns.  Call immediately if you develop:   Excessive rectal bleeding (more than a cup or passing large clots)  Increased discomfort  Fever greater than 100 F  Difficulty urinating    Post Anesthesia Home Care Instructions  Activity: Get plenty of rest for the remainder of the day. A responsible individual must stay with you for 24 hours following the procedure.  For the next 24 hours, DO NOT: -Drive a car -Advertising copywriter -Drink alcoholic beverages -Take any medication unless instructed by your physician -Make any legal decisions or sign important papers.  Meals: Start with liquid foods such as gelatin or soup. Progress to regular foods as tolerated. Avoid greasy, spicy, heavy foods. If nausea and/or vomiting occur, drink only clear liquids until the nausea and/or vomiting subsides. Call your physician if vomiting continues.  Special Instructions/Symptoms: Your throat may feel dry or sore from the anesthesia or the breathing tube placed in your throat during surgery. If this causes discomfort, gargle with warm salt water. The discomfort should disappear within 24 hours.  If you had a scopolamine patch placed behind your ear for the management of post- operative nausea and/or vomiting:  1. The medication in the patch is effective for 72 hours, after which it should be removed.   Wrap patch in a tissue and discard in the trash. Wash hands thoroughly with soap and water. 2. You may remove the patch earlier than 72 hours if you experience unpleasant side effects which may include dry mouth, dizziness or visual disturbances. 3. Avoid touching the patch. Wash your hands with soap and water after contact with the patch.   F/u with Renal Dr.  Remember to show them the dry cracked skin on rt heel.

## 2017-05-16 ENCOUNTER — Encounter (HOSPITAL_BASED_OUTPATIENT_CLINIC_OR_DEPARTMENT_OTHER): Payer: Self-pay | Admitting: General Surgery

## 2017-05-20 ENCOUNTER — Ambulatory Visit: Payer: Medicare Other | Admitting: Gastroenterology

## 2017-05-20 ENCOUNTER — Encounter: Payer: Self-pay | Admitting: Gastroenterology

## 2017-05-20 VITALS — BP 131/64 | HR 67 | Temp 96.6°F | Ht 67.0 in | Wt 131.2 lb

## 2017-05-20 DIAGNOSIS — R634 Abnormal weight loss: Secondary | ICD-10-CM | POA: Diagnosis not present

## 2017-05-20 DIAGNOSIS — R195 Other fecal abnormalities: Secondary | ICD-10-CM | POA: Diagnosis not present

## 2017-05-20 NOTE — Progress Notes (Signed)
cc'ed to pcp °

## 2017-05-20 NOTE — Assessment & Plan Note (Signed)
Although weight is increased from Jan 2019, overall he has lost approximately 9 lbs in total since October of last year. EGD/colonoscopy on file. CT X 2 without contrast from late 2018. Low concern for chronic mesenteric ischemia as he has no postprandial abdominal pain. Reports good appetite. Trial of pancreatic enzymes. Resume PPI once daily. Close follow-up in 6-8 weeks.

## 2017-05-20 NOTE — Assessment & Plan Note (Signed)
82 year old male with several month history of frequent bowel habits, some loose and some soft, with colonoscopy unrevealing. No significant improvement with Bentyl BID. CT without contrast on file several months ago. Weight loss noted but denies poor appetite. He does endorse "oily" stools. Will continue Bentyl and trial Creon 72,000 units with meals and 36,000 units with snacks. Samples provided. He is to call with an update. Return for close follow-up in 6-8 weeks.

## 2017-05-20 NOTE — Progress Notes (Signed)
Primary Care Physician:  Toma Deiters, MD Primary GI: Dr. Darrick Penna   Chief Complaint  Patient presents with  . Diarrhea    HPI:   Charles Vasquez is a 82 y.o. male presenting today with a history of GERD and diarrhea. EGD and colonoscopy completed Jan 2019. Unrevealing findings on colonoscopy to explain diarrhea. Noted to have anal stricture and referred to General Surgery. Diarrhea has been present for 2-3 months. Review of weights in epic show that he weighed around 140 in Oct 2018. Today 131.   Recommended Bentyl. PPI daily recommended but not taking. Recommended eval with Gen Surg for anal stenosis. Saw Dr. Romie Levee with findings of posterior midline anal fissure with significant sphincter hypertension, underwent Botox injection. Sometimes watery stool, sometimes soft. Intermittent abdominal cramping. Averaging 3 BMs per day. Present for about 2-3 months. Appetite is good. Bentyl twice a day. Stool looks oily. Has a good appetite. Majority of bowel movements are postprandial.    Vitals - 1 value per visit 05/20/2017 05/15/2017 04/15/2017 03/25/2017 03/18/2017  Weight (lb) 131.2 135.4 130  128.6   Vitals - 1 value per visit 01/26/2017 01/20/2017 01/01/2017 12/26/2016  Weight (lb) 138 138.1 140 140    CTs on file from October and November 2018 without contrast. Wall thickening at splenic flexure, distal aspect of colon. Colonoscopy unrevealing.   Past Medical History:  Diagnosis Date  . Anal pain   . AVF (arteriovenous fistula) (HCC) 05-13-2017 per pt currently AVF access used is left thigh   hx multiple AVF surgery's (previously left upper arm, bilateral thigh) last surgery -- right upper arm creation 11-26-2016  . ESRD (end stage renal disease) on dialysis [facility])    [facility] in [place], [place] on MWF   . History of CVA (cerebrovascular accident)    05-13-2017  per pt stated "thats what they told yrs ago"  per pt no residual  . Hyperlipidemia   . Hypertension   . Type 2  diabetes mellitus treated with insulin [facility])     Past Surgical History:  Procedure Laterality Date  . AMPUTATION Right 01/01/2017   Procedure: REVISION AMPUTATION RIGHT INDEX FINGER;  Surgeon: Dairl Ponder, MD;  Location: [facility] OR;  Service: Orthopedics;  Laterality: Right;  . ARTERIOVENOUS GRAFT PLACEMENT    . ARTERIOVENOUS GRAFT PLACEMENT Left 10/22/2016   thigh  . AV FISTULA PLACEMENT  03/31/2012   Procedure: ARTERIOVENOUS (AV) FISTULA CREATION;  Surgeon: [name], MD;  Location: [facility] OR;  Service: Vascular;  Laterality: Right;  First stage Brachial vein transposition  . AV FISTULA PLACEMENT Right 08/25/2012   Procedure: INSERTION OF ARTERIOVENOUS (AV) GORE-TEX GRAFT ARM;  Surgeon: [name], MD;  Location: [facility] OR;  Service: Vascular;  Laterality: Right;  . AV FISTULA PLACEMENT Left 07/23/2016   Procedure: INSERTION OF ARTERIOVENOUS (AV) GORE-TEX GRAFT LEFT UPPER ARM;  Surgeon: [name], MD;  Location: [facility] OR;  Service: Vascular;  Laterality: Left;  . AV FISTULA PLACEMENT Left 10/22/2016   Procedure: INSERTION OF 4-87mm x 45cm  ARTERIOVENOUS (AV) GORE-TEX GRAFT THIGH-LEFT;  Surgeon: [name], MD;  Location: [facility] OR;  Service: Vascular;  Laterality: Left;  . CATARACT EXTRACTION W/ INTRAOCULAR LENS  IMPLANT, BILATERAL    . COLONOSCOPY N/A 03/25/2017   Dr. Darrick Penna: examined portion of ileum normal. Redundant left colon, stricture at anus   . ESOPHAGOGASTRODUODENOSCOPY N/A 03/25/2017   Dr. Darrick Penna: widely patent Schatzki ring at GE junction s/p dilation, atrophic gastritis, single  duodenal AVM, non-bleeding  . INSERTION OF DIALYSIS CATHETER Left 05/05/2012   Procedure: INSERTION OF DIALYSIS CATHETER;  Surgeon: Chuck Hint, MD;  Location: [facility] OR;  Service: Vascular;  Laterality: Left;  . INSERTION OF DIALYSIS CATHETER Right 10/01/2016   Procedure: INSERTION OF DIALYSIS CATHETER- RIGHT FEMORAL;  Surgeon: [name], MD;  Location: [facility] OR;  Service: Vascular;  Laterality: Right;    . IR FLUORO GUIDE CV LINE RIGHT  06/10/2016  . IR GENERIC HISTORICAL  06/07/2016   IR US GUIDE VASC ACCESS RIGHT 06/07/2016 Oley Balm, MD [facility]-INTERV RAD  . IR THROMBECTOMY AV FISTULA W/THROMBOLYSIS/PTA INC/SHUNT/IMG RIGHT Right 06/07/2016  . IR US GUIDE VASC ACCESS RIGHT  06/10/2016  . LIGATION ARTERIOVENOUS GORTEX GRAFT Left 08/04/2016   Procedure: LIGATION ARTERIOVENOUS GORTEX GRAFT;  Surgeon: Larina Earthly, MD;  Location: [facility] OR;  Service: Vascular;  Laterality: Left;  . LIGATION ARTERIOVENOUS GORTEX GRAFT Right 11/26/2016   Procedure: LIGATION OF RIGHT UPPER ARM ARTERIOVENOUS GORTEX GRAFT;  Surgeon: [name], MD;  Location: [facility] OR;  Service: Vascular;  Laterality: Right;  . LIGATION OF ARTERIOVENOUS  FISTULA Right 08/25/2012   Procedure: LIGATION OF ARTERIOVENOUS  FISTULA;  Surgeon: [name], MD;  Location: [facility] OR;  Service: Vascular;  Laterality: Right;  Ligation of right brachial vein transposition  . REMOVAL OF A DIALYSIS CATHETER Right 05/05/2012   Procedure: REMOVAL OF A DIALYSIS CATHETER;  Surgeon: Chuck Hint, MD;  Location: [facility] OR;  Service: Vascular;  Laterality: Right;  . REMOVAL OF A DIALYSIS CATHETER Right 10/01/2016   Procedure: REMOVAL OF A DIALYSIS CATHETER-RIGHT UPPER CHEST;  Surgeon: [name], MD;  Location: [facility] OR;  Service: Vascular;  Laterality: Right;  . REMOVAL OF A DIALYSIS CATHETER Right 11/26/2016   Procedure: REMOVAL OF RIGHT FEMORAL TUNNEL DIALYSIS CATHETER;  Surgeon: [name], MD;  Location: [facility] OR;  Service: Vascular;  Laterality: Right;  . SPHINCTEROTOMY N/A 05/15/2017   Procedure: POSSIBLE SPHINCTEROTOMY (BOTOX);  Surgeon: Romie Levee, MD;  Location: [facility];  Service: General;  Laterality: N/A;  . TOE AMPUTATION  02/2007   left foot first 3 toes  . UPPER EXTREMITY VENOGRAPHY Bilateral 07/18/2016   Procedure: Bilateral Upper Extremity Venography;  Surgeon: [name], MD;  Location: [facility] INVASIVE CV LAB;  Service:  Cardiovascular;  Laterality: Bilateral;    Current Outpatient Medications  Medication Sig Dispense Refill  . aspirin EC 81 MG tablet Take 81 mg by mouth daily.    . calcium carbonate (TUMS - DOSED IN MG ELEMENTAL CALCIUM) 500 MG chewable tablet Chew 1 tablet by mouth daily as needed for indigestion or heartburn.    . carboxymethylcellulose (REFRESH PLUS) 0.5 % SOLN 1 drop 3 (three) times daily as needed. AS DIRECTED    . dicyclomine (BENTYL) 10 MG capsule 1 PO 30 MINUTES PRIOR TO BREAKFAST AND LUNCH (Patient taking differently: Take 10 mg by mouth. 1 PO 30 MINUTES PRIOR TO BREAKFAST AND LUNCH) 62 capsule 11  . insulin glargine (LANTUS) 100 UNIT/ML injection Inject 5 Units into the skin at bedtime.     Marland Kitchen labetalol (NORMODYNE) 300 MG tablet Take 300 mg by mouth 2 (two) times daily.     Marland Kitchen losartan (COZAAR) 50 MG tablet Take 50 mg by mouth every evening.     . multivitamin (RENA-VIT) TABS tablet Take 1 tablet by mouth daily.    . traMADol (ULTRAM) 50 MG tablet Take 1 tablet (50 mg total) by mouth every  6 (six) hours as needed. 30 tablet 0  . pantoprazole (PROTONIX) 40 MG tablet 1 PO 30 MINUTES PRIOR TO MEALS BID FOR 3 MOS THEN QD (Patient not taking: Reported on 05/20/2017) 60 tablet 11  . Polysacch Fe Cmp-Fe Heme Poly (BIFERA) 28 MG TABS 1 po bid (Patient not taking: Reported on 05/20/2017) 60 tablet 11   No current facility-administered medications for this visit.     Allergies as of 05/20/2017 - Review Complete 05/20/2017  Allergen Reaction Noted  . Lisinopril Other (See Comments)     Family History  Problem Relation Age of Onset  . Diabetes Mother   . Hypertension Mother   . Diabetes Father   . Hypertension Father     Social History   Socioeconomic History  . Marital status: Widowed    Spouse name: None  . Number of children: None  . Years of education: None  . Highest education level: None  Social Needs  . Financial resource strain: None  . Food insecurity - worry: None  .  Food insecurity - inability: None  . Transportation needs - medical: None  . Transportation needs - non-medical: None  Occupational History  . None  Tobacco Use  . Smoking status: Never Smoker  . Smokeless tobacco: Never Used  Substance and Sexual Activity  . Alcohol use: No  . Drug use: No  . Sexual activity: Yes  Other Topics Concern  . None  Social History Narrative  . None    Review of Systems: Gen: see HPI  CV: Denies chest pain, palpitations, syncope, peripheral edema, and claudication. Resp: Denies dyspnea at rest, cough, wheezing, coughing up blood, and pleurisy. GI: see HPI  Derm: Denies rash, itching, dry skin Psych: Denies depression, anxiety, memory loss, confusion. No homicidal or suicidal ideation.  Heme: Denies bruising, bleeding, and enlarged lymph nodes.  Physical Exam: BP 131/64   Pulse 67   Temp (!) 96.6 F (35.9 C) (Oral)   Ht 5\' 7"  (1.702 m)   Wt 131 lb 3.2 oz (59.5 kg)   BMI 20.55 kg/m  General:   Alert and oriented. No distress noted. Pleasant and cooperative.  Head:  Normocephalic and atraumatic. Eyes:  Conjuctiva clear without scleral icterus. Mouth:  Oral mucosa pink and moist.  Abdomen:  +BS, soft, non-tender and non-distended. No rebound or guarding. No HSM or masses noted. Msk:  Kyphosis  Extremities:  Without edema. Neurologic:  Alert and  oriented x4 Psych:  Alert and cooperative. Normal mood and affect.

## 2017-05-20 NOTE — Patient Instructions (Signed)
Start back taking Protonix once each morning, 30 minutes before breakfast.  Continue Bentyl twice a day, 30 minutes before breakfast and lunch.   I would like to try something called pancreatic enzymes. I have given you Creon capsules. Take 2 capsules WHILE YOU ARE EATING breakfast, lunch and dinner. If you have a snack, take only 1 capsule. I have given you samples to try. Please let me know how this works for you.  We will see you in 6-8 weeks!  It was a pleasure to see you today. I strive to create trusting relationships with patients to provide genuine, compassionate, and quality care. I value your feedback. If you receive a survey regarding your visit,  I greatly appreciate you taking time to fill this out.   Gelene Mink, PhD, ANP-BC [facility]

## 2017-05-21 ENCOUNTER — Telehealth: Payer: Self-pay | Admitting: *Deleted

## 2017-05-21 NOTE — Telephone Encounter (Signed)
If diarrhea is worse, we need to get stool studies: Cdiff, stool culture, giardia

## 2017-05-21 NOTE — Telephone Encounter (Signed)
Sister [name] called in stating his diarrhea is getting worse today. He has had several accidents today in his clothes. She is upset as this is not getting better and wants to know what else can be done aside from the instructions from yesterday's OV? Please advise [name] thanks

## 2017-05-22 ENCOUNTER — Other Ambulatory Visit: Payer: Self-pay

## 2017-05-22 DIAGNOSIS — R197 Diarrhea, unspecified: Secondary | ICD-10-CM

## 2017-05-22 NOTE — Telephone Encounter (Signed)
Called and spoke to [employer], his friend, who is on his list to call. She is aware for pt to go to the lab and get containers to do the stool studies.

## 2017-05-22 NOTE — Telephone Encounter (Signed)
No phone number for Charles Vasquez. Called pt, many rings and no answer.

## 2017-05-22 NOTE — Telephone Encounter (Signed)
Had to enter one manual order and [name] is coming by the office to pick up for pt since he had another appt this afternoon.

## 2017-05-22 NOTE — Telephone Encounter (Signed)
Charles Vasquez called back and is aware pt will need to do stool studies. Her phone number is [phone].

## 2017-05-26 ENCOUNTER — Other Ambulatory Visit: Payer: Self-pay | Admitting: Gastroenterology

## 2017-05-26 ENCOUNTER — Telehealth: Payer: Self-pay | Admitting: Gastroenterology

## 2017-05-26 MED ORDER — VANCOMYCIN HCL 125 MG PO CAPS: 125.0000 mg | ORAL_CAPSULE | Freq: Four times a day (QID) | ORAL | 0 refills | Status: AC

## 2017-05-26 NOTE — Telephone Encounter (Signed)
PT's sister, Queen Blossom, was the one who took the specimens to the lab. I have informed her of the results and plan and will call when we know regimen.  I reviewed C-Diff precautions with her.

## 2017-05-26 NOTE — Progress Notes (Signed)
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 Stool culture also shows shiga toxin, no salmonella, shigella, or E Coli 0157 present. He is also on dialysis. First line treatment for CDI would be vancomycin, which may be an issue. Please let patient know I am reviewing with infectious disease. IN interim, needs to use dedicated toilet for himself, use bleach to sanitize all surfaces. Please send handout regarding CDiff. He will need antibiotics, but I am seeing what is the best course.

## 2017-05-26 NOTE — Telephone Encounter (Signed)
[name] RETURNED CALL, [phone]

## 2017-05-26 NOTE — Progress Notes (Signed)
I called and spoke to [facility], pt's friend. She is aware and aware of instructions and the cleaning. Also aware [name] will be contacting ID for further instructions.  She wants me to call pt's sister, Elmer Sow @ [phone] and discuss with her also.

## 2017-05-26 NOTE — Progress Notes (Signed)
T/C from [place] at [facility]. She just wanted to know if pt is aware of the C-Diff and I told her I have informed his friend, Music therapist. Her name is on paperwork that we can discuss with her. Pt had just returned from dialysis and was out of it per Delores.

## 2017-05-26 NOTE — Progress Notes (Signed)
With toxins negative and GDH detected, this is an indeterminate result and PCR was positive. We will treat empirically for Cdiff. Discussed with Dr. Drue Second. As he was having acute on chronic diarrhea, I would like to treat him. I reviewed stool culture with ID as well. Despite shiga toxin reported, no other concerning features isolated. Will treat with oral vancomycin QID for 10 days. Oral vanc is not systemic, so no dose adjustments needed with dialysis. However, please let dialysis know about this new medication for the next 10 days. If diarrhea is persistent, worsens, associated with weakness, fatigue, abdominal pain, etc, seek medical attention right away. I sent to pharmacy. Hold off on Bentyl for now until completes vancomycin course. Do not take imodium.

## 2017-05-26 NOTE — Progress Notes (Signed)
Called marie's number and no answer. Her name is not listed on the form anyway, so I called Charles Vasquez back and went over the instructions again. She is aware I will call about the meds when I know something.

## 2017-05-26 NOTE — Progress Notes (Signed)
Charles Vasquez and Charles Vasquez are aware of the medication and the plan. They know for pt not to take the Bentyl and the Imodium with the Vancomycin. I have called [facility] in [place] [phone] ) to inform them. I left Vm for a return call in reference to this pt.

## 2017-05-27 ENCOUNTER — Telehealth: Payer: Self-pay | Admitting: Gastroenterology

## 2017-05-27 LAB — C. DIFFICILE GDH AND TOXIN A/B
GDH ANTIGEN: DETECTED
MICRO NUMBER:: 90333299
SPECIMEN QUALITY:: ADEQUATE
TOXIN A AND B: NOT DETECTED

## 2017-05-27 LAB — STOOL CULTURE
MICRO NUMBER: 90333531
MICRO NUMBER: 90333532
MICRO NUMBER:: 90333533
SPECIMEN QUALITY: ADEQUATE
SPECIMEN QUALITY:: ADEQUATE
SPECIMEN QUALITY:: ADEQUATE

## 2017-05-27 LAB — CLOSTRIDIUM DIFFICILE TOXIN B, QUALITATIVE, REAL-TIME PCR: CDIFFPCR: DETECTED — AB

## 2017-05-27 LAB — E COLI O157 CULTURE
EC RESULT: NEGATIVE
MICRO NUMBER:: 90337293
SPECIMEN QUALITY:: ADEQUATE

## 2017-05-27 LAB — GIARDIA ANTIGEN
MICRO NUMBER: 90333136
RESULT:: NOT DETECTED
SPECIMEN QUALITY:: ADEQUATE

## 2017-05-27 NOTE — Telephone Encounter (Signed)
Noted  

## 2017-05-27 NOTE — Progress Notes (Signed)
I called [facility] @ [phone] and spoke to the nurse, Wilkie Aye, and informed her of the diagnosis, medication and plan. She is aware no dose adjustments are needed with dialysis.

## 2017-05-27 NOTE — Telephone Encounter (Signed)
That's interesting. They had called with worsening diarrhea from baseline when I saw him. There is a slight chance that he does not actually have an active infection and possibly a carrier. However, clinically he was having worsening stools and symptomatic. We are empirically treating.

## 2017-05-27 NOTE — Telephone Encounter (Signed)
I spoke to [facility] again, she was telling me pt has improved on the medication that was given to him when he came in. I told her he has C Diff, and told her it is very important that he gets the Vancomycin as we had discussed yesterday.   She said that she will go get it.

## 2017-05-27 NOTE — Telephone Encounter (Signed)
Please call patient at [phone]. Questions about his medication that was called in yesterday

## 2017-06-09 DIAGNOSIS — N186 End stage renal disease: Secondary | ICD-10-CM | POA: Diagnosis not present

## 2017-06-09 DIAGNOSIS — Z992 Dependence on renal dialysis: Secondary | ICD-10-CM | POA: Diagnosis not present

## 2017-06-11 ENCOUNTER — Telehealth: Payer: Self-pay

## 2017-06-11 DIAGNOSIS — Z992 Dependence on renal dialysis: Secondary | ICD-10-CM | POA: Diagnosis not present

## 2017-06-11 DIAGNOSIS — N186 End stage renal disease: Secondary | ICD-10-CM | POA: Diagnosis not present

## 2017-06-11 NOTE — Telephone Encounter (Signed)
I would rather hold off on labeling this as persistent Cdiff unless he has profuse, consistent watery stools. If he is having soft stool as he states, then I don't feel we are dealing with possible CDI. Previous results more likely carrier state but there is no way to know as he was symptomatic.  Let's do this:  Needs to take Creon 72000 units with each meal (three times a day) and 1 with snacks. He had noted in previous phone documentation that he was better with "the new medicine" we gave him, which was Creon.   Let's give it a few days and see how he does with this. Also start taking a probiotic if he is not already.

## 2017-06-11 NOTE — Telephone Encounter (Addendum)
Pt's sister, [name], called and said he completed the medication for the C Diff. He is still having episodes of diarrhea, sometimes 3-4 times a day. [name] said the diarrhea improved some while on the medication, but he still had some. It is getting a little worse now and sometimes just very soft stool and sometimes watery diarrhea.  He does not want to go anywhere because of this. [name]'s call back number is [phone].  Please advise!

## 2017-06-11 NOTE — Telephone Encounter (Signed)
Charles Vasquez, please see the previous note. I had added about the characteristics of the diarrhea. Pt is not taking the pancreatic enzymes as ordered. He is sometimes taking one twice a day and sometimes not any.

## 2017-06-11 NOTE — Telephone Encounter (Signed)
We need to characterize diarrhea. Is it soft or profusely watery or mixture? Did he notice any improvement on the vancomycin? Is he taking the pancreatic enzymes I had given?

## 2017-06-11 NOTE — Telephone Encounter (Signed)
Pt's sister and caregiver are aware. Caregiver will bring meds by tomorrow and go over all that he is to be taking.

## 2017-06-12 ENCOUNTER — Other Ambulatory Visit: Payer: Self-pay

## 2017-06-12 ENCOUNTER — Telehealth: Payer: Self-pay

## 2017-06-12 NOTE — Telephone Encounter (Signed)
Referral sent to [facility] via Epic.

## 2017-06-12 NOTE — Patient Outreach (Signed)
[facility]) Care Management  06/12/2017  Charles Vasquez Nov 23, 1935 [id number]   Telephone Screen  Referral Date: 06/12/17 Referral Source: MD officeTobi Bastos [facility]) Referral Reason: " confusion regarding current medications, concerned about compliance, DM" Insurance: [facility] Medicare   Outreach attempt # 1 to patient. No answer. RN CM left HIPAA compliant voicemail message along with contact info.     Plan: RN CM will make outreach attempt to patient within 3-4 business days.  RN CM will send unsuccessful outreach letter to patient.   [name], RN,BSN,CCM [facility] Care Management Telephonic Care Management Coordinator Direct Phone: [phone] Toll Free: [phone] Fax: [phone]

## 2017-06-12 NOTE — Telephone Encounter (Signed)
Caregiver seems confused regarding what patient should be taking.   Both Doris and I discussed with patient, caretaker, and sister: 1. Take Bentyl BID before breakfast and lunch. Call us if any constipation. 2. Do not take Creon right now. 3. Take Protonix once each morning.    [facility] clinical pool: please refer to [facility] Care Management. He has a lot of confusion regarding current medications, and I am concerned about compliance.

## 2017-06-12 NOTE — Telephone Encounter (Signed)
Pt came by the office with caregiver Collene Leyden and sister [name] to review his medications.

## 2017-06-13 ENCOUNTER — Other Ambulatory Visit: Payer: Self-pay

## 2017-06-13 DIAGNOSIS — Z992 Dependence on renal dialysis: Secondary | ICD-10-CM | POA: Diagnosis not present

## 2017-06-13 DIAGNOSIS — N186 End stage renal disease: Secondary | ICD-10-CM | POA: Diagnosis not present

## 2017-06-13 NOTE — Patient Outreach (Signed)
[facility]) Care Management  06/13/2017  Charles Vasquez 09/23/1935 [id number]   Telephone Screen  Referral Date: 06/12/17 Referral Source: MD officeTobi Bastos [facility]) Referral Reason: " confusion regarding current medications, concerned about compliance, DM" Insurance: [facility] Medicare   Incoming call from [employer] (ROI on file and listed as friend to patient) responding to voicemail message that was left. She voices that patient is currently at dialysis and goes M,W,F. She reports that he best time to reach patient is to try back on Tuesdays in the early afternoons. Advised that RN CM would attempt to outreach patient at that time.       Plan: RN CM will make outreach attempt to patient within three business days.  [name], RN,BSN,CCM [facility] Care Management Telephonic Care Management Coordinator Direct Phone: [phone] Toll Free: [phone] Fax: [phone]

## 2017-06-16 ENCOUNTER — Ambulatory Visit: Payer: Self-pay

## 2017-06-16 DIAGNOSIS — Z992 Dependence on renal dialysis: Secondary | ICD-10-CM | POA: Diagnosis not present

## 2017-06-16 DIAGNOSIS — N186 End stage renal disease: Secondary | ICD-10-CM | POA: Diagnosis not present

## 2017-06-17 ENCOUNTER — Other Ambulatory Visit: Payer: Self-pay

## 2017-06-17 NOTE — Patient Outreach (Signed)
[facility]) Care Management  06/17/2017  Charles Vasquez [birth year] [id number]   Telephone Screen  Referral Date:06/12/17 [facility] Referral Reason:" confusion regarding current medications, concerned about compliance, DM" Insurance:[employer] Medicare    Outreach attempt #2 to patient. Spoke with patient and screening completed.  Social: Patient resides in his home along with his "friend" Charles Vasquez. He voices that he is fairly independent and require minimal assistance with ADLs/IADLs. He uses van services to get back and forth to dialysis. He voices that his friend drives him to MD appts. He reports ne fall within the past year and no injury sustained.DME in the home include walker and cbg meter.  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 Patient goes to dialysis M,W,F. He stets he normally checks his blood sugar about once a day. Last A1C was 7.1(Oct 2018).   Medications: Per patient report he is taking about 10 meds. He denies any trouble affording meds. He voices that his friend Charles Vasquez helps his manage meds. Patient denies any issues managing meds initially. RN CM discussed referral reason with patient. He then voiced that may he does need some help. He voices that he is currently taking meds directly from pill bottle.    Appointments: He is followed by PCP(saw yesterday) and well as GI specialist Charles Vasquez).    Consent: [facility] services reviewed and discussed. Patient gave verbal consent for services. He voices that he does not feel like he needs RN and SW services at this time. Patient goes to dialysis on M,W,F and requests NOT to be called on those days.       Plan: RN CM will send [facility] referral for polypharmacy med review and possible med mgmt assistance.   Charles Fairy, RN,BSN,CCM [facility] Care Management Telephonic Care Management Coordinator Direct Phone: [phone] Toll Free: [phone] Fax: [phone]

## 2017-06-18 ENCOUNTER — Other Ambulatory Visit: Payer: Self-pay

## 2017-06-18 DIAGNOSIS — N186 End stage renal disease: Secondary | ICD-10-CM | POA: Diagnosis not present

## 2017-06-18 DIAGNOSIS — Z992 Dependence on renal dialysis: Secondary | ICD-10-CM | POA: Diagnosis not present

## 2017-06-18 NOTE — Patient Outreach (Signed)
[facility]) Care Management  06/18/2017  Charles Vasquez 07-26-1935 [id number]  82 year old male referred to [facility] Care Management by MD office. [name] services requested for medication management.  PMHx includes, but not limited to, hypertension, h/o MI and CVA, GERD, Diabetes Mellitus, osteoarthritis,anal stenosis, recent C. diff infection.  Unsuccessful outreach phone call #1 to Charles Vasquez.  HIPAA compliant voice message left asking for return call.    Plan: Outreach call #2 on Tuesday 4/16 when patient doesn't have dialysis.  This was day stated in a note as best day to reach him.  Berlin Hun, PharmD Clinical Pharmacist [facility] [phone]

## 2017-06-19 ENCOUNTER — Encounter: Payer: Self-pay | Admitting: Vascular Surgery

## 2017-06-19 ENCOUNTER — Telehealth: Payer: Self-pay | Admitting: Gastroenterology

## 2017-06-19 ENCOUNTER — Other Ambulatory Visit: Payer: Self-pay | Admitting: *Deleted

## 2017-06-19 ENCOUNTER — Encounter: Payer: Self-pay | Admitting: *Deleted

## 2017-06-19 ENCOUNTER — Ambulatory Visit: Payer: Medicare Other | Admitting: Vascular Surgery

## 2017-06-19 VITALS — BP 178/78 | HR 68 | Temp 97.2°F | Resp 20 | Ht 67.0 in | Wt 135.0 lb

## 2017-06-19 DIAGNOSIS — L97419 Non-pressure chronic ulcer of right heel and midfoot with unspecified severity: Secondary | ICD-10-CM | POA: Diagnosis not present

## 2017-06-19 DIAGNOSIS — I739 Peripheral vascular disease, unspecified: Secondary | ICD-10-CM

## 2017-06-19 NOTE — Telephone Encounter (Signed)
Spoke with pts sister. Pt's diarrhea started back yesterday. Pt had two appointments in [place] and had to turn around and go back home due to messing his clothing up. Stool is black with a bad odor. Pt hasn't taken any otc anti diarrhea medications. Pt's sister thinks pts friend is giving him the Bentyl. Pt's sister reports that as soon as he says he has to go to the bathroom, it starts coming out.

## 2017-06-19 NOTE — Telephone Encounter (Signed)
[phone]  PLEASE CALL PATIENT SISTER.  UP UNTIL YESTERDAY HE WAS OK, BUT NOW THE DIARRHEA IS BACK

## 2017-06-19 NOTE — Progress Notes (Signed)
History of Present Illness:  Patient is a 82 y.o. year old male who presents for evaluation of right foot ulcer.  He has been followed recently by [facility] health by a General surgeon Dr. Kriste Basque  for his right heel ulcer.  He was then referred to [place].  He denise calf pain with ambulation, but he does not ambulate much and uses a rolling walker.  There is not rest pain.  There is a  history of ulcerations on the left  Foot previous wounds requiring toe amputations.      He has been followed by our group in the past for HD access placements.  His last access was 10/22/2016 left thigh graft.  He has also had right UE ischemic changes for which his access was ligated to save his hand.    ABI's last performed 10/07/2016 for pre-op planning of femoral loop graft showed Biphasic LE flow with highly calcified vessels.     Atherosclerotic risk factors and other medical problems include DM, ESRD on HD via left femoral loop graft, MI, PAD with calcified vessels, HTN.  He takes takes a daily 81 mg aspirin.  He is not on anticoagulation.     Past Medical History:  Diagnosis Date  . Anal pain   . AVF (arteriovenous fistula) (HCC) 05-13-2017 per pt currently AVF access used is left thigh   hx multiple AVF surgery's (previously left upper arm, bilateral thigh) last surgery -- right upper arm creation 11-26-2016  . ESRD (end stage renal disease) on dialysis [facility])    [facility] in [place], [place] on MWF   . History of CVA (cerebrovascular accident)    05-13-2017  per pt stated "thats what they told yrs ago"  per pt no residual  . Hyperlipidemia   . Hypertension   . Myocardial infarction (HCC)   . Stroke (HCC)   . Type 2 diabetes mellitus treated with insulin [facility])     Past Surgical History:  Procedure Laterality Date  . AMPUTATION Right 01/01/2017   Procedure: REVISION AMPUTATION RIGHT INDEX FINGER;  Surgeon: Dairl Ponder, MD;  Location: [facility] OR;  Service: Orthopedics;  Laterality: Right;  .  ARTERIOVENOUS GRAFT PLACEMENT    . ARTERIOVENOUS GRAFT PLACEMENT Left 10/22/2016   thigh  . AV FISTULA PLACEMENT  03/31/2012   Procedure: ARTERIOVENOUS (AV) FISTULA CREATION;  Surgeon: [name], MD;  Location: [facility] OR;  Service: Vascular;  Laterality: Right;  First stage Brachial vein transposition  . AV FISTULA PLACEMENT Right 08/25/2012   Procedure: INSERTION OF ARTERIOVENOUS (AV) GORE-TEX GRAFT ARM;  Surgeon: [name], MD;  Location: [facility] OR;  Service: Vascular;  Laterality: Right;  . AV FISTULA PLACEMENT Left 07/23/2016   Procedure: INSERTION OF ARTERIOVENOUS (AV) GORE-TEX GRAFT LEFT UPPER ARM;  Surgeon: [name], MD;  Location: [facility] OR;  Service: Vascular;  Laterality: Left;  . AV FISTULA PLACEMENT Left 10/22/2016   Procedure: INSERTION OF 4-8mm x 45cm  ARTERIOVENOUS (AV) GORE-TEX GRAFT THIGH-LEFT;  Surgeon: [name], MD;  Location: [facility] OR;  Service: Vascular;  Laterality: Left;  . CATARACT EXTRACTION W/ INTRAOCULAR LENS  IMPLANT, BILATERAL    . COLONOSCOPY N/A 03/25/2017   Dr. Darrick Penna: examined portion of ileum normal. Redundant left colon, stricture at anus   . ESOPHAGOGASTRODUODENOSCOPY N/A 03/25/2017   Dr. Darrick Penna: widely patent Schatzki ring at GE junction s/p dilation, atrophic gastritis, single duodenal AVM, non-bleeding  . INSERTION OF DIALYSIS CATHETER Left 05/05/2012   Procedure:  INSERTION OF DIALYSIS CATHETER;  Surgeon: Chuck Hint, MD;  Location: [facility] OR;  Service: Vascular;  Laterality: Left;  . INSERTION OF DIALYSIS CATHETER Right 10/01/2016   Procedure: INSERTION OF DIALYSIS CATHETER- RIGHT FEMORAL;  Surgeon: [name], MD;  Location: [facility] OR;  Service: Vascular;  Laterality: Right;  . IR FLUORO GUIDE CV LINE RIGHT  06/10/2016  . IR GENERIC HISTORICAL  06/07/2016   IR US GUIDE VASC ACCESS RIGHT 06/07/2016 Oley Balm, MD [facility]-INTERV RAD  . IR THROMBECTOMY AV FISTULA W/THROMBOLYSIS/PTA INC/SHUNT/IMG RIGHT Right 06/07/2016  . IR US GUIDE VASC ACCESS RIGHT  06/10/2016   . LIGATION ARTERIOVENOUS GORTEX GRAFT Left 08/04/2016   Procedure: LIGATION ARTERIOVENOUS GORTEX GRAFT;  Surgeon: Larina Earthly, MD;  Location[facility] OR;  Service: Vascular;  Laterality: Left;  . LIGATION ARTERIOVENOUS GORTEX GRAFT Right 11/26/2016   Procedure: LIGATION OF RIGHT UPPER ARM ARTERIOVENOUS GORTEX GRAFT;  Surgeon: [name], MD;  Location: [facility] OR;  Service: Vascular;  Laterality: Right;  . LIGATION OF ARTERIOVENOUS  FISTULA Right 08/25/2012   Procedure: LIGATION OF ARTERIOVENOUS  FISTULA;  Surgeon: [name], MD;  Location: [facility] OR;  Service: Vascular;  Laterality: Right;  Ligation of right brachial vein transposition  . REMOVAL OF A DIALYSIS CATHETER Right 05/05/2012   Procedure: REMOVAL OF A DIALYSIS CATHETER;  Surgeon: Chuck Hint, MD;  Location: [facility] OR;  Service: Vascular;  Laterality: Right;  . REMOVAL OF A DIALYSIS CATHETER Right 10/01/2016   Procedure: REMOVAL OF A DIALYSIS CATHETER-RIGHT UPPER CHEST;  Surgeon: [name], MD;  Location: [facility] OR;  Service: Vascular;  Laterality: Right;  . REMOVAL OF A DIALYSIS CATHETER Right 11/26/2016   Procedure: REMOVAL OF RIGHT FEMORAL TUNNEL DIALYSIS CATHETER;  Surgeon: [name], MD;  Location: [facility] OR;  Service: Vascular;  Laterality: Right;  . SPHINCTEROTOMY N/A 05/15/2017   Procedure: POSSIBLE SPHINCTEROTOMY (BOTOX);  Surgeon: Romie Levee, MD;  Location: [facility];  Service: General;  Laterality: N/A;  . TOE AMPUTATION  02/2007   left foot first 3 toes  . UPPER EXTREMITY VENOGRAPHY Bilateral 07/18/2016   Procedure: Bilateral Upper Extremity Venography;  Surgeon: [name], MD;  Location: [facility] INVASIVE CV LAB;  Service: Cardiovascular;  Laterality: Bilateral;    ROS:   General:  No weight loss, Fever, chills  HEENT: No recent headaches, no nasal bleeding, no visual changes, no sore throat  Neurologic: No dizziness, blackouts, seizures. No recent symptoms of stroke or mini- stroke. No recent episodes of  slurred speech, or temporary blindness.  Cardiac: No recent episodes of chest pain/pressure, no shortness of breath at rest.  No shortness of breath with exertion.  Denies history of atrial fibrillation or irregular heartbeat  Vascular: No history of rest pain in feet.  No history of claudication.  No history of non-healing ulcer, No history of DVT   Pulmonary: No home oxygen, no productive cough, no hemoptysis,  No asthma or wheezing  Musculoskeletal:  [ ]  Arthritis, [ ]  Low back pain,  [ ]  Joint pain  Hematologic:No history of hypercoagulable state.  No history of easy bleeding.  No history of anemia  Gastrointestinal: No hematochezia or melena,  No gastroesophageal reflux, no trouble swallowing  Urinary: [ ]  chronic Kidney disease, [ ]  on HD - [ ]  MWF or [ ]  TTHS, [ ]  Burning with urination, [ ]  Frequent urination, [ ]  Difficulty urinating;   Skin: No rashes  Psychological: No history of anxiety,  No  history of depression  Social History Social History   Tobacco Use  . Smoking status: Never Smoker  . Smokeless tobacco: Never Used  Substance Use Topics  . Alcohol use: No  . Drug use: No    Family History Family History  Problem Relation Age of Onset  . Diabetes Mother   . Hypertension Mother   . AAA (abdominal aortic aneurysm) Mother   . Diabetes Father   . Hypertension Father     Allergies  Allergies  Allergen Reactions  . Lisinopril Other (See Comments)    HYPERKALEMIA RENAL DYSFUNCTION PROGRESSION     Current Outpatient Medications  Medication Sig Dispense Refill  . aspirin EC 81 MG tablet Take 81 mg by mouth daily.    . calcium carbonate (TUMS - DOSED IN MG ELEMENTAL CALCIUM) 500 MG chewable tablet Chew 1 tablet by mouth daily as needed for indigestion or heartburn.    . carboxymethylcellulose (REFRESH PLUS) 0.5 % SOLN 1 drop 3 (three) times daily as needed. AS DIRECTED    . dicyclomine (BENTYL) 10 MG capsule 1 PO 30 MINUTES PRIOR TO BREAKFAST AND LUNCH  (Patient not taking: Reported on 06/12/2017) 62 capsule 11  . insulin glargine (LANTUS) 100 UNIT/ML injection Inject 5 Units into the skin at bedtime.     Marland Kitchen labetalol (NORMODYNE) 300 MG tablet Take 300 mg by mouth 2 (two) times daily.     Marland Kitchen losartan (COZAAR) 50 MG tablet Take 50 mg by mouth every evening.     . multivitamin (RENA-VIT) TABS tablet Take 1 tablet by mouth daily.    . pantoprazole (PROTONIX) 40 MG tablet 1 PO 30 MINUTES PRIOR TO MEALS BID FOR 3 MOS THEN QD (Patient not taking: Reported on 05/20/2017) 60 tablet 11  . Polysacch Fe Cmp-Fe Heme Poly (BIFERA) 28 MG TABS 1 po bid (Patient not taking: Reported on 05/20/2017) 60 tablet 11  . sevelamer carbonate (RENVELA) 800 MG tablet Take 1,600 mg by mouth 3 (three) times daily with meals. Supposed to take one 800 mg with snacks and caregiver said he only does that sometimes    . traMADol (ULTRAM) 50 MG tablet Take 1 tablet (50 mg total) by mouth every 6 (six) hours as needed. (Patient not taking: Reported on 06/12/2017) 30 tablet 0   No current facility-administered medications for this visit.     Physical Examination  Vitals:   06/19/17 1416  BP: (!) 178/78  Pulse: 68  Resp: 20  Temp: (!) 97.2 F (36.2 C)  TempSrc: Oral  SpO2: 99%  Weight: 135 lb (61.2 kg)  Height: 5\' 7"  (1.702 m)    Body mass index is 21.14 kg/m.  General:  Alert and oriented, no acute distress HEENT: Normal, normocephalic Neck: No bruit or JVD Pulmonary: Clear to auscultation bilaterally Cardiac: Regular Rate and Rhythm without murmur Abdomen: Soft, non-tender, non-distended, palpable aortic pulse Skin: Right heel black eschar 6 x 8 cm.  No open wounds, no drainage Extremity Pulses:  brachial, femoral  Pulses  Doppler dorsalis pedis, weak posterior tibial bilaterally. Musculoskeletal: Missing finger tips right hand, waisting of inter thinnar muscles B hands, grip intact 4/5.  Left foot s/p amputation 1-3 toes well healed.  Neurologic: Upper and lower  extremity motor grossly intact and symmetric  DATA:  Today in room ABI demonstrated non compressible vessels performed by Dr. Randie Heinz   ASSESSMENT:  Dry gangrene right heel ulcer > 4 weeks PAD with calcified vessels unobtainable ABI's due to non compressible vessels.   PLAN:  He has several compounding factors leading to the development of a heel ulcer including DM, in mobility and peripheral neurothopy.  He has known calcified vessels, but on his last Studies a year ago he had bi phasic flow.  He has healed the left foot toe amputation well, but a heel ulcer could lead to infection and possible limb loss.  We will schedule him for aortoangiogram with right LE run off and possible intervention.      In the mean time he should elevate the heel when at rest so it is floating without pressure on it.  We also recommended painting the dry gangrene with betadine solution daily.  He has HD M-W-F and is followed by Dr. Kristian Covey in [place].     Mosetta Pigeon PA-C [facility]  The patient was seen in conjunction with Dr. Randie Heinz today   I have interviewed and examined patient with PA and agree with assessment and plan above.  Bedside ABI was noncompressible.  I counseled the family that he is at high risk for proximal limb loss given the heel ulcer but at this time it is not infected and likely secondary to pressure and we can attempt limb salvage in this situation.  He has previous ABIs that were noncompressible and biphasic and this is pretty similar to our exam today.  I can possibly feel very weak popliteal pulse on the right but he does have a strong femoral pulse.  On the left side he has a patent AV graft which may complicate angiogram.  Regardless we are going to proceed with right lower extremity angiogram from a left femoral approach on a nondialysis day in the near future.  Brandon C. Randie Heinz, MD [facility] Office:  [phone] Pager: [phone]

## 2017-06-19 NOTE — Telephone Encounter (Signed)
Spoke with pt's sister, she would like orders put in for [facility]. I called [facility] and they gave instructions on writing order on prescription pad and faxing it to [phone]. Orders have been faxed and pt's sister is aware.  Pt's sister also wants to know should pt start the Creon samples givencontinue the Bentyl.

## 2017-06-19 NOTE — Telephone Encounter (Signed)
Please have him complete stool sample for Cdiff GDH and toxin A/B and PCR (same that were ordered 05/23/17).

## 2017-06-20 DIAGNOSIS — Z992 Dependence on renal dialysis: Secondary | ICD-10-CM | POA: Diagnosis not present

## 2017-06-20 DIAGNOSIS — N186 End stage renal disease: Secondary | ICD-10-CM | POA: Diagnosis not present

## 2017-06-20 NOTE — Telephone Encounter (Signed)
Continue low dose Bentyl for now. Let's wait on stool studies before starting Creon.

## 2017-06-20 NOTE — Telephone Encounter (Signed)
Pt's sister notified

## 2017-06-23 DIAGNOSIS — Z992 Dependence on renal dialysis: Secondary | ICD-10-CM | POA: Diagnosis not present

## 2017-06-23 DIAGNOSIS — N186 End stage renal disease: Secondary | ICD-10-CM | POA: Diagnosis not present

## 2017-06-24 ENCOUNTER — Ambulatory Visit: Payer: Self-pay

## 2017-06-24 ENCOUNTER — Other Ambulatory Visit: Payer: Self-pay

## 2017-06-24 DIAGNOSIS — Z992 Dependence on renal dialysis: Secondary | ICD-10-CM | POA: Diagnosis not present

## 2017-06-24 DIAGNOSIS — N186 End stage renal disease: Secondary | ICD-10-CM | POA: Diagnosis not present

## 2017-06-24 NOTE — Patient Outreach (Signed)
[facility]) Care Management  06/24/2017  Charles Vasquez Jan 22, 1936 [id number]   82 year old male referred to [facility] Care Management by MD office. [facility] services requested for medication management.  PMHx includes, but not limited to, hypertension, h/o MI and CVA, GERD, Diabetes Mellitus, osteoarthritis,anal stenosis, recent C. diff infection.  Unsuccessful outreach phone call #2  to [name]. Answering machine did not pick up.   Plan: Will send unsuccesful patient outreach letter to [name].    Will make #3 phone call attempt in 3-4 business days.    Berlin Hun, PharmD Clinical Pharmacist [facility] [phone]

## 2017-06-25 ENCOUNTER — Ambulatory Visit (HOSPITAL_COMMUNITY)
Admission: RE | Disposition: A | Discharge status: Home / Self Care | Payer: Self-pay | Source: Ambulatory Visit | Attending: Vascular Surgery

## 2017-06-25 ENCOUNTER — Ambulatory Visit (HOSPITAL_COMMUNITY)
Admission: RE | Admit: 2017-06-25 | Discharge: 2017-06-25 | Disposition: A | Discharge status: Home / Self Care | Payer: Medicare Other | Source: Ambulatory Visit | Attending: Vascular Surgery | Admitting: Vascular Surgery

## 2017-06-25 ENCOUNTER — Telehealth: Payer: Self-pay | Admitting: *Deleted

## 2017-06-25 DIAGNOSIS — E1122 Type 2 diabetes mellitus with diabetic chronic kidney disease: Secondary | ICD-10-CM | POA: Diagnosis not present

## 2017-06-25 DIAGNOSIS — Z992 Dependence on renal dialysis: Secondary | ICD-10-CM | POA: Insufficient documentation

## 2017-06-25 DIAGNOSIS — Z7982 Long term (current) use of aspirin: Secondary | ICD-10-CM | POA: Diagnosis not present

## 2017-06-25 DIAGNOSIS — Z8249 Family history of ischemic heart disease and other diseases of the circulatory system: Secondary | ICD-10-CM | POA: Diagnosis not present

## 2017-06-25 DIAGNOSIS — I120 Hypertensive chronic kidney disease with stage 5 chronic kidney disease or end stage renal disease: Secondary | ICD-10-CM | POA: Diagnosis not present

## 2017-06-25 DIAGNOSIS — E785 Hyperlipidemia, unspecified: Secondary | ICD-10-CM | POA: Insufficient documentation

## 2017-06-25 DIAGNOSIS — Z833 Family history of diabetes mellitus: Secondary | ICD-10-CM | POA: Diagnosis not present

## 2017-06-25 DIAGNOSIS — Z89021 Acquired absence of right finger(s): Secondary | ICD-10-CM | POA: Insufficient documentation

## 2017-06-25 DIAGNOSIS — Z8673 Personal history of transient ischemic attack (TIA), and cerebral infarction without residual deficits: Secondary | ICD-10-CM | POA: Insufficient documentation

## 2017-06-25 DIAGNOSIS — Z9842 Cataract extraction status, left eye: Secondary | ICD-10-CM | POA: Insufficient documentation

## 2017-06-25 DIAGNOSIS — Z89422 Acquired absence of other left toe(s): Secondary | ICD-10-CM | POA: Diagnosis not present

## 2017-06-25 DIAGNOSIS — N186 End stage renal disease: Secondary | ICD-10-CM | POA: Insufficient documentation

## 2017-06-25 DIAGNOSIS — Z79899 Other long term (current) drug therapy: Secondary | ICD-10-CM | POA: Insufficient documentation

## 2017-06-25 DIAGNOSIS — Z888 Allergy status to other drugs, medicaments and biological substances status: Secondary | ICD-10-CM | POA: Diagnosis not present

## 2017-06-25 DIAGNOSIS — I255 Ischemic cardiomyopathy: Secondary | ICD-10-CM | POA: Insufficient documentation

## 2017-06-25 DIAGNOSIS — Z794 Long term (current) use of insulin: Secondary | ICD-10-CM | POA: Diagnosis not present

## 2017-06-25 DIAGNOSIS — Z9889 Other specified postprocedural states: Secondary | ICD-10-CM | POA: Insufficient documentation

## 2017-06-25 DIAGNOSIS — Z9841 Cataract extraction status, right eye: Secondary | ICD-10-CM | POA: Insufficient documentation

## 2017-06-25 DIAGNOSIS — I998 Other disorder of circulatory system: Secondary | ICD-10-CM | POA: Insufficient documentation

## 2017-06-25 HISTORY — PX: PERIPHERAL VASCULAR ATHERECTOMY: CATH118256

## 2017-06-25 HISTORY — PX: ABDOMINAL AORTOGRAM W/LOWER EXTREMITY: CATH118223

## 2017-06-25 LAB — POCT I-STAT, CHEM 8
BUN: 46 mg/dL — AB (ref 6–20)
Calcium, Ion: 0.9 mmol/L — ABNORMAL LOW (ref 1.15–1.40)
Chloride: 95 mmol/L — ABNORMAL LOW (ref 101–111)
Creatinine, Ser: 6.5 mg/dL — ABNORMAL HIGH (ref 0.61–1.24)
Glucose, Bld: 313 mg/dL — ABNORMAL HIGH (ref 65–99)
HEMATOCRIT: 38 % — AB (ref 39.0–52.0)
Hemoglobin: 12.9 g/dL — ABNORMAL LOW (ref 13.0–17.0)
Potassium: 4.8 mmol/L (ref 3.5–5.1)
SODIUM: 131 mmol/L — AB (ref 135–145)
TCO2: 30 mmol/L (ref 22–32)

## 2017-06-25 LAB — POCT ACTIVATED CLOTTING TIME: ACTIVATED CLOTTING TIME: 224 s

## 2017-06-25 SURGERY — ABDOMINAL AORTOGRAM W/LOWER EXTREMITY
Anesthesia: LOCAL

## 2017-06-25 MED ORDER — HEPARIN (PORCINE) IN NACL 2-0.9 UNIT/ML-% IJ SOLN
INTRAMUSCULAR | Status: AC | PRN
  Administered 2017-06-25: 1000 mL via INTRA_ARTERIAL

## 2017-06-25 MED ORDER — VERAPAMIL HCL 2.5 MG/ML IV SOLN
INTRAVENOUS | Status: AC
  Filled 2017-06-25: qty 2

## 2017-06-25 MED ORDER — ONDANSETRON HCL 4 MG/2ML IJ SOLN: 4.0000 mg | Freq: Four times a day (QID) | INTRAMUSCULAR | Status: DC | PRN

## 2017-06-25 MED ORDER — SODIUM CHLORIDE 0.9% FLUSH: 3.0000 mL | INTRAVENOUS | Status: DC | PRN

## 2017-06-25 MED ORDER — LABETALOL HCL 5 MG/ML IV SOLN
INTRAVENOUS | Status: DC | PRN
  Administered 2017-06-25: 10 mg via INTRAVENOUS

## 2017-06-25 MED ORDER — OXYCODONE HCL 5 MG PO TABS: 5.0000 mg | ORAL_TABLET | ORAL | Status: DC | PRN

## 2017-06-25 MED ORDER — SODIUM CHLORIDE 0.9 % IV SOLN: 250.0000 mL | INTRAVENOUS | Status: DC | PRN

## 2017-06-25 MED ORDER — LIDOCAINE HCL (PF) 1 % IJ SOLN
INTRAMUSCULAR | Status: DC | PRN
  Administered 2017-06-25: 15 mL

## 2017-06-25 MED ORDER — LABETALOL HCL 5 MG/ML IV SOLN: 10.0000 mg | INTRAVENOUS | Status: DC | PRN

## 2017-06-25 MED ORDER — MORPHINE SULFATE (PF) 10 MG/ML IV SOLN: 2.0000 mg | INTRAVENOUS | Status: DC | PRN

## 2017-06-25 MED ORDER — NITROGLYCERIN 1 MG/10 ML FOR IR/CATH LAB
INTRA_ARTERIAL | Status: AC
  Filled 2017-06-25: qty 10

## 2017-06-25 MED ORDER — ASPIRIN EC 325 MG PO TBEC
325.0000 mg | DELAYED_RELEASE_TABLET | Freq: Every day | ORAL | Status: DC
  Administered 2017-06-25: 325 mg via ORAL

## 2017-06-25 MED ORDER — IODIXANOL 320 MG/ML IV SOLN
INTRAVENOUS | Status: DC | PRN
  Administered 2017-06-25: 150 mL via INTRA_ARTERIAL

## 2017-06-25 MED ORDER — ACETAMINOPHEN 325 MG PO TABS: 650.0000 mg | ORAL_TABLET | ORAL | Status: DC | PRN

## 2017-06-25 MED ORDER — ASPIRIN EC 325 MG PO TBEC
DELAYED_RELEASE_TABLET | ORAL | Status: AC
  Administered 2017-06-25: 325 mg via ORAL
  Filled 2017-06-25: qty 1

## 2017-06-25 MED ORDER — HEPARIN (PORCINE) IN NACL 1000-0.9 UT/500ML-% IV SOLN
INTRAVENOUS | Status: AC
  Filled 2017-06-25: qty 1000

## 2017-06-25 MED ORDER — NITROGLYCERIN 1 MG/10 ML FOR IR/CATH LAB
INTRA_ARTERIAL | Status: DC | PRN
  Administered 2017-06-25 (×2): 200 ug via INTRA_ARTERIAL

## 2017-06-25 MED ORDER — HEPARIN SODIUM (PORCINE) 1000 UNIT/ML IJ SOLN
INTRAMUSCULAR | Status: DC | PRN
  Administered 2017-06-25: 6000 [IU] via INTRAVENOUS

## 2017-06-25 MED ORDER — HEPARIN SODIUM (PORCINE) 1000 UNIT/ML IJ SOLN
INTRAMUSCULAR | Status: AC
  Filled 2017-06-25: qty 1

## 2017-06-25 MED ORDER — HYDRALAZINE HCL 20 MG/ML IJ SOLN: 5.0000 mg | INTRAMUSCULAR | Status: DC | PRN

## 2017-06-25 MED ORDER — NITROGLYCERIN IN D5W 200-5 MCG/ML-% IV SOLN
INTRAVENOUS | Status: AC
  Filled 2017-06-25: qty 250

## 2017-06-25 MED ORDER — VIPERSLIDE LUBRICANT OPTIME
TOPICAL | Status: DC | PRN
  Administered 2017-06-25: 12:00:00 via SURGICAL_CAVITY

## 2017-06-25 MED ORDER — LIDOCAINE HCL (PF) 1 % IJ SOLN
INTRAMUSCULAR | Status: AC
  Filled 2017-06-25: qty 30

## 2017-06-25 MED ORDER — SODIUM CHLORIDE 0.9% FLUSH: 3.0000 mL | Freq: Two times a day (BID) | INTRAVENOUS | Status: DC

## 2017-06-25 MED ORDER — LABETALOL HCL 5 MG/ML IV SOLN
INTRAVENOUS | Status: AC
  Filled 2017-06-25: qty 4

## 2017-06-25 SURGICAL SUPPLY — 29 items
BAG SNAP BAND KOVER 36X36 (MISCELLANEOUS) ×3 IMPLANT
BALLN COYOTE ES OTW 1.5X20X142 (BALLOONS) ×3
BALLN COYOTE OTW 2X220X150 (BALLOONS) ×3
BALLN STERLING OTW 2X220X150 (BALLOONS) ×3
BALLOON COYOTE OTW 2X220X150 (BALLOONS) ×2 IMPLANT
BALLOON CYTE ES OTW 1.5X20X142 (BALLOONS) ×2 IMPLANT
BALLOON STERLING OTW 2X220X150 (BALLOONS) ×2 IMPLANT
CATH CXI SUPP ST 2.6FR 150CM (CATHETERS) ×3 IMPLANT
CATH OMNI FLUSH 5F 65CM (CATHETERS) ×3 IMPLANT
CATH QUICKCROSS SUPP .035X90CM (MICROCATHETER) ×3 IMPLANT
COVER DOME SNAP 22 D (MISCELLANEOUS) ×3 IMPLANT
CROWN STEALTH MICRO-30 1.25MM (CATHETERS) ×3 IMPLANT
DEVICE CLOSURE MYNXGRIP 6/7F (Vascular Products) ×3 IMPLANT
GLIDEWIRE ANGLED SS 035X260CM (WIRE) ×3 IMPLANT
KIT ENCORE 26 ADVANTAGE (KITS) ×3 IMPLANT
KIT MICROPUNCTURE NIT STIFF (SHEATH) ×3 IMPLANT
KIT PV (KITS) ×3 IMPLANT
LUBRICANT VIPERSLIDE CORONARY (MISCELLANEOUS) ×3 IMPLANT
SHEATH AVANTI 11CM 5FR (SHEATH) ×3 IMPLANT
SHEATH AVANTI 11CM 6FR (SHEATH) ×3 IMPLANT
SHEATH HIGHFLEX ANSEL 6FRX55 (SHEATH) ×3 IMPLANT
SHIELD RADPAD SCOOP 12X17 (MISCELLANEOUS) ×3 IMPLANT
SYR MEDRAD MARK V 150ML (SYRINGE) ×3 IMPLANT
TRANSDUCER W/STOPCOCK (MISCELLANEOUS) ×3 IMPLANT
TRAY PV CATH (CUSTOM PROCEDURE TRAY) ×3 IMPLANT
WIRE BENTSON .035X145CM (WIRE) ×3 IMPLANT
WIRE G V18X300CM (WIRE) ×3 IMPLANT
WIRE SPARTACORE .014X300CM (WIRE) ×3 IMPLANT
WIRE VIPER ADVANCE .017X335CM (WIRE) ×3 IMPLANT

## 2017-06-25 NOTE — Telephone Encounter (Signed)
-----   Message from Maeola Harman, MD sent at 06/25/2017  1:38 PM EDT ----- [name] [id number] May 20, 1935  06/25/2017 Pre-operative Diagnosis: Critical right lower extremity ischemia, end-stage renal disease  Surgeon:  Apolinar Junes C. Randie Heinz, MD  Procedure Performed: 1.  Ultrasound-guided cannulation left common femoral artery 2.  Aortogram bilateral lower extremity runoff 3.  CSI atherectomy with 1.25 micro of right posterior tibial artery 4.  Balloon angioplasty of right posterior tibial artery with 2 mm balloon 5.  Minx closure of left common femoral artery  F/u in 2-3 weeks for wound check. If he calls prior for worsening wound he will need right bka.

## 2017-06-25 NOTE — H&P (Signed)
   History and Physical Update  The patient was interviewed and re-examined.  The patient's previous History and Physical has been reviewed and is unchanged from recent office visit. Plan for right lower extremity angiogram from left femoral approach.  Jermarcus Mcfadyen C. Johneric Mcfadden, MD [facility] Office: [phone] Pager: [phone]  06/25/2017, 7:27 AM   

## 2017-06-25 NOTE — Progress Notes (Addendum)
Right groin dressing removed, slight oozing noted, level 0, Dr Randie Heinz was notified, pressure was held 10 minutes and redressed. Dr Randie Heinz to see pt/ Continue to sit pt up, no bleeding at groin site, continue to monitor and elevate.

## 2017-06-25 NOTE — H&P (View-Only) (Signed)
   History and Physical Update  The patient was interviewed and re-examined.  The patient's previous History and Physical has been reviewed and is unchanged from recent office visit. Plan for right lower extremity angiogram from left femoral approach.  Laporscha Linehan C. Randie Heinz, MD [facility] Office: [phone] Pager: [phone]  06/25/2017, 7:27 AM

## 2017-06-25 NOTE — Telephone Encounter (Signed)
Copied from [employer]

## 2017-06-25 NOTE — Discharge Instructions (Signed)

## 2017-06-25 NOTE — Op Note (Signed)
Patient name: Charles Vasquez MRN: [id number] DOB: 1935/04/02 Sex: male  06/25/2017 Pre-operative Diagnosis: Critical right lower extremity ischemia, end-stage renal disease Post-operative diagnosis:  Same Surgeon:  Luanna Salk. Randie Heinz, MD Procedure Performed: 1.  Ultrasound-guided cannulation left common femoral artery 2.  Aortogram bilateral lower extremity runoff 3.  CSI atherectomy with 1.25 micro of right posterior tibial artery 4.  Balloon angioplasty of right posterior tibial artery with 2 mm balloon 5.  Minx closure of left common femoral artery  Indications: 82 year old male with history end-stage renal disease and diabetes.  He now has a right heel ulceration is indicated for angina and possible intervention.  Findings: Aorta and iliac segments are free of flow-limiting disease.  Bilateral SFAs are calcified but he does not have any flow limitation.  On the left there is a patent AV fistula.  There is likely tibial occlusive disease on the left and they are heavily calcified but contrast is not timed to demonstrate this.  On the right there is a 50% anterior tibial artery stenosis and then runoff is dominant via the anterior tibial to the foot.  The peroneal is diminutive but also runs to the ankle.  Posterior tibial artery is occluded in its midsegment.  After intervention of the posterior tibial artery we still do not have good runoff in the vessel and this was an unsuccessful intervention.   Procedure:  The patient was identified in the holding area and taken to room 8.  The patient was then placed supine on the table and prepped and draped in the usual sterile fashion.  A time out was called.  Ultrasound was used to evaluate the left common femoral artery.  It was patent .  A digital ultrasound image was acquired.  A micropuncture needle was used to access the left common femoral artery under ultrasound guidance.  An 018 wire was advanced without resistance and a micropuncture sheath was  placed.  The 018 wire was removed and a benson wire was placed.  The micropuncture sheath was exchanged for a 5 french sheath.  An omniflush catheter was advanced over the wire to the level of L-1.  An abdominal angiogram was obtained followed by bilateral lower extremity runoff with the above findings.  We then elected to go up and over the bifurcation form angiogram from the right popliteal artery to demonstrate the tibial arteries which demonstrate occluded posterior tibial artery.  With this we placed a long 6 French sheath over a stiff wire and the patient was heparinized.  We then used V 18 wire to cross the occluded posterior tibial artery and confirmed intraluminal access at the foot.  This time we noted there was not very much runoff into the foot via the posterior tibial although we had hoped to be able to improve this.  We then performed CSI atherectomy of the entire posterior tibial artery followed by 2 mm balloon angioplasty.  With this we did not have any run off via the posterior tibial artery.  We then gave nitro 200 mcg still had no runoff.  We then went distally and tried to cross a very distal foot lesion and ballooned this with a short 2 mm balloon.  We still had a column of contrast which demonstrated no flow distally.  We appreciate an angiogram from distally in the foot which demonstrated that the posterior tibial was in fact patent however there was no runoff still.  We gave an additional dose of nitro 200 mcg but  still in the runoff.  At this time it became evident there would be no way to improve the flow via his posterior tibial artery in the dermatome of his heel.  He will be at very high risk for progressive wound of his right heel and will likely need an amputation in the near future.  He tolerated procedure without immediate comp occasion.   Contrast: 150cc  Cecil Vandyke C. Randie Heinz, MD [facility] Office: [phone] Pager: [phone]

## 2017-06-26 ENCOUNTER — Encounter (HOSPITAL_COMMUNITY): Payer: Self-pay | Admitting: Vascular Surgery

## 2017-06-26 MED FILL — Heparin Sod (Porcine)-NaCl IV Soln 1000 Unit/500ML-0.9%: INTRAVENOUS | Qty: 1000 | Status: AC

## 2017-06-27 ENCOUNTER — Ambulatory Visit: Payer: Self-pay

## 2017-06-27 DIAGNOSIS — N186 End stage renal disease: Secondary | ICD-10-CM | POA: Diagnosis not present

## 2017-06-27 DIAGNOSIS — Z992 Dependence on renal dialysis: Secondary | ICD-10-CM | POA: Diagnosis not present

## 2017-06-30 ENCOUNTER — Ambulatory Visit: Payer: Medicare Other | Admitting: Gastroenterology

## 2017-06-30 ENCOUNTER — Telehealth: Payer: Self-pay | Admitting: Vascular Surgery

## 2017-06-30 DIAGNOSIS — Z992 Dependence on renal dialysis: Secondary | ICD-10-CM | POA: Diagnosis not present

## 2017-06-30 DIAGNOSIS — N186 End stage renal disease: Secondary | ICD-10-CM | POA: Diagnosis not present

## 2017-06-30 NOTE — Telephone Encounter (Signed)
Sched appt 07/11/17 at 3:30. Lm on cell# to inform pt of appt.

## 2017-06-30 NOTE — Telephone Encounter (Signed)
-----   Message from Sharee Pimple, RN sent at 06/25/2017  2:03 PM EDT ----- Regarding: 2-3 weeks   ----- Message ----- From: Maeola Harman, MD Sent: 06/25/2017   1:38 PM To: [facility] [address]  Charles Vasquez [id number] 09-08-1935  06/25/2017 Pre-operative Diagnosis: Critical right lower extremity ischemia, end-stage renal disease  Surgeon:  Apolinar Junes C. Randie Heinz, MD  Procedure Performed: 1.  Ultrasound-guided cannulation left common femoral artery 2.  Aortogram bilateral lower extremity runoff 3.  CSI atherectomy with 1.25 micro of right posterior tibial artery 4.  Balloon angioplasty of right posterior tibial artery with 2 mm balloon 5.  Minx closure of left common femoral artery  F/u in 2-3 weeks for wound check. If he calls prior for worsening wound he will need right bka.

## 2017-07-01 ENCOUNTER — Other Ambulatory Visit: Payer: Self-pay

## 2017-07-01 NOTE — Patient Outreach (Signed)
[facility]) Care Management  07/01/2017  Charles Vasquez Oct 29, 1935 [id number]   81[facility] by MD [facility] services requested for medication management. PMHx includes, but not limited to, hypertension, h/o MI and CVA, GERD, Diabetes Mellitus, osteoarthritis,anal stenosis, recent C. diff infection.  Unsuccessful outreach phone call #3  to [name]. Answering machine did not pick up.   I have intentionally called on Tuesdays as an earlier note states that patient does not have dialysis on Tuesdays.    Plan:  Route case closure letter to PCP, Dr. Olena Leatherwood.    I will be happy to assist with future medication needs as they arise.   Berlin Hun, PharmD Clinical Pharmacist [facility] [phone]

## 2017-07-02 DIAGNOSIS — N186 End stage renal disease: Secondary | ICD-10-CM | POA: Diagnosis not present

## 2017-07-02 DIAGNOSIS — Z992 Dependence on renal dialysis: Secondary | ICD-10-CM | POA: Diagnosis not present

## 2017-07-04 DIAGNOSIS — N186 End stage renal disease: Secondary | ICD-10-CM | POA: Diagnosis not present

## 2017-07-04 DIAGNOSIS — Z992 Dependence on renal dialysis: Secondary | ICD-10-CM | POA: Diagnosis not present

## 2017-07-07 DIAGNOSIS — Z992 Dependence on renal dialysis: Secondary | ICD-10-CM | POA: Diagnosis not present

## 2017-07-07 DIAGNOSIS — N186 End stage renal disease: Secondary | ICD-10-CM | POA: Diagnosis not present

## 2017-07-08 DIAGNOSIS — Z992 Dependence on renal dialysis: Secondary | ICD-10-CM | POA: Diagnosis not present

## 2017-07-08 DIAGNOSIS — N186 End stage renal disease: Secondary | ICD-10-CM | POA: Diagnosis not present

## 2017-07-09 DIAGNOSIS — Z992 Dependence on renal dialysis: Secondary | ICD-10-CM | POA: Diagnosis not present

## 2017-07-09 DIAGNOSIS — N186 End stage renal disease: Secondary | ICD-10-CM | POA: Diagnosis not present

## 2017-07-10 DIAGNOSIS — S0003XA Contusion of scalp, initial encounter: Secondary | ICD-10-CM | POA: Diagnosis not present

## 2017-07-10 DIAGNOSIS — Z992 Dependence on renal dialysis: Secondary | ICD-10-CM | POA: Diagnosis not present

## 2017-07-10 DIAGNOSIS — S3991XA Unspecified injury of abdomen, initial encounter: Secondary | ICD-10-CM | POA: Diagnosis not present

## 2017-07-10 DIAGNOSIS — Z794 Long term (current) use of insulin: Secondary | ICD-10-CM | POA: Diagnosis not present

## 2017-07-10 DIAGNOSIS — S4992XA Unspecified injury of left shoulder and upper arm, initial encounter: Secondary | ICD-10-CM | POA: Diagnosis not present

## 2017-07-10 DIAGNOSIS — I120 Hypertensive chronic kidney disease with stage 5 chronic kidney disease or end stage renal disease: Secondary | ICD-10-CM | POA: Diagnosis not present

## 2017-07-10 DIAGNOSIS — S199XXA Unspecified injury of neck, initial encounter: Secondary | ICD-10-CM | POA: Diagnosis not present

## 2017-07-10 DIAGNOSIS — S300XXA Contusion of lower back and pelvis, initial encounter: Secondary | ICD-10-CM | POA: Diagnosis not present

## 2017-07-10 DIAGNOSIS — E1122 Type 2 diabetes mellitus with diabetic chronic kidney disease: Secondary | ICD-10-CM | POA: Diagnosis not present

## 2017-07-10 DIAGNOSIS — W1839XA Other fall on same level, initial encounter: Secondary | ICD-10-CM | POA: Diagnosis not present

## 2017-07-10 DIAGNOSIS — M6281 Muscle weakness (generalized): Secondary | ICD-10-CM | POA: Diagnosis not present

## 2017-07-10 DIAGNOSIS — M545 Low back pain: Secondary | ICD-10-CM | POA: Diagnosis not present

## 2017-07-10 DIAGNOSIS — M25512 Pain in left shoulder: Secondary | ICD-10-CM | POA: Diagnosis not present

## 2017-07-10 DIAGNOSIS — S46912A Strain of unspecified muscle, fascia and tendon at shoulder and upper arm level, left arm, initial encounter: Secondary | ICD-10-CM | POA: Diagnosis not present

## 2017-07-10 DIAGNOSIS — R51 Headache: Secondary | ICD-10-CM | POA: Diagnosis not present

## 2017-07-10 DIAGNOSIS — N186 End stage renal disease: Secondary | ICD-10-CM | POA: Diagnosis not present

## 2017-07-11 ENCOUNTER — Other Ambulatory Visit: Payer: Self-pay

## 2017-07-11 ENCOUNTER — Encounter (HOSPITAL_COMMUNITY): Payer: Self-pay | Admitting: Emergency Medicine

## 2017-07-11 ENCOUNTER — Inpatient Hospital Stay (HOSPITAL_COMMUNITY)
Admission: EM | Admit: 2017-07-11 | Discharge: 2017-07-17 | DRG: 239 | Disposition: A | Discharge status: Rehabilitation Facility | Payer: Medicare Other | Attending: Internal Medicine | Admitting: Internal Medicine

## 2017-07-11 ENCOUNTER — Encounter: Payer: Self-pay | Admitting: Vascular Surgery

## 2017-07-11 ENCOUNTER — Ambulatory Visit (INDEPENDENT_AMBULATORY_CARE_PROVIDER_SITE_OTHER): Payer: Medicare Other | Admitting: Vascular Surgery

## 2017-07-11 VITALS — BP 156/72 | HR 85 | Temp 99.8°F | Resp 20 | Ht 67.0 in | Wt 130.0 lb

## 2017-07-11 DIAGNOSIS — Z89021 Acquired absence of right finger(s): Secondary | ICD-10-CM

## 2017-07-11 DIAGNOSIS — N186 End stage renal disease: Secondary | ICD-10-CM | POA: Diagnosis not present

## 2017-07-11 DIAGNOSIS — R402413 Glasgow coma scale score 13-15, at hospital admission: Secondary | ICD-10-CM | POA: Diagnosis present

## 2017-07-11 DIAGNOSIS — Z8673 Personal history of transient ischemic attack (TIA), and cerebral infarction without residual deficits: Secondary | ICD-10-CM

## 2017-07-11 DIAGNOSIS — L97419 Non-pressure chronic ulcer of right heel and midfoot with unspecified severity: Secondary | ICD-10-CM

## 2017-07-11 DIAGNOSIS — I169 Hypertensive crisis, unspecified: Secondary | ICD-10-CM | POA: Diagnosis not present

## 2017-07-11 DIAGNOSIS — I10 Essential (primary) hypertension: Secondary | ICD-10-CM | POA: Diagnosis not present

## 2017-07-11 DIAGNOSIS — E1152 Type 2 diabetes mellitus with diabetic peripheral angiopathy with gangrene: Principal | ICD-10-CM | POA: Diagnosis present

## 2017-07-11 DIAGNOSIS — E162 Hypoglycemia, unspecified: Secondary | ICD-10-CM | POA: Diagnosis not present

## 2017-07-11 DIAGNOSIS — Z794 Long term (current) use of insulin: Secondary | ICD-10-CM | POA: Diagnosis not present

## 2017-07-11 DIAGNOSIS — L89151 Pressure ulcer of sacral region, stage 1: Secondary | ICD-10-CM | POA: Diagnosis present

## 2017-07-11 DIAGNOSIS — I120 Hypertensive chronic kidney disease with stage 5 chronic kidney disease or end stage renal disease: Secondary | ICD-10-CM | POA: Diagnosis present

## 2017-07-11 DIAGNOSIS — R7309 Other abnormal glucose: Secondary | ICD-10-CM | POA: Diagnosis not present

## 2017-07-11 DIAGNOSIS — Z89422 Acquired absence of other left toe(s): Secondary | ICD-10-CM | POA: Diagnosis not present

## 2017-07-11 DIAGNOSIS — D72829 Elevated white blood cell count, unspecified: Secondary | ICD-10-CM | POA: Diagnosis not present

## 2017-07-11 DIAGNOSIS — R7881 Bacteremia: Secondary | ICD-10-CM | POA: Diagnosis present

## 2017-07-11 DIAGNOSIS — S91301A Unspecified open wound, right foot, initial encounter: Secondary | ICD-10-CM | POA: Diagnosis not present

## 2017-07-11 DIAGNOSIS — I252 Old myocardial infarction: Secondary | ICD-10-CM

## 2017-07-11 DIAGNOSIS — R0989 Other specified symptoms and signs involving the circulatory and respiratory systems: Secondary | ICD-10-CM | POA: Diagnosis not present

## 2017-07-11 DIAGNOSIS — E1142 Type 2 diabetes mellitus with diabetic polyneuropathy: Secondary | ICD-10-CM | POA: Diagnosis not present

## 2017-07-11 DIAGNOSIS — I739 Peripheral vascular disease, unspecified: Secondary | ICD-10-CM

## 2017-07-11 DIAGNOSIS — L089 Local infection of the skin and subcutaneous tissue, unspecified: Secondary | ICD-10-CM | POA: Diagnosis not present

## 2017-07-11 DIAGNOSIS — S88111A Complete traumatic amputation at level between knee and ankle, right lower leg, initial encounter: Secondary | ICD-10-CM | POA: Diagnosis not present

## 2017-07-11 DIAGNOSIS — D631 Anemia in chronic kidney disease: Secondary | ICD-10-CM | POA: Diagnosis present

## 2017-07-11 DIAGNOSIS — Z79899 Other long term (current) drug therapy: Secondary | ICD-10-CM | POA: Diagnosis not present

## 2017-07-11 DIAGNOSIS — D638 Anemia in other chronic diseases classified elsewhere: Secondary | ICD-10-CM | POA: Diagnosis not present

## 2017-07-11 DIAGNOSIS — Z992 Dependence on renal dialysis: Secondary | ICD-10-CM

## 2017-07-11 DIAGNOSIS — G8929 Other chronic pain: Secondary | ICD-10-CM | POA: Diagnosis not present

## 2017-07-11 DIAGNOSIS — E11628 Type 2 diabetes mellitus with other skin complications: Secondary | ICD-10-CM | POA: Diagnosis not present

## 2017-07-11 DIAGNOSIS — D62 Acute posthemorrhagic anemia: Secondary | ICD-10-CM | POA: Diagnosis not present

## 2017-07-11 DIAGNOSIS — I2510 Atherosclerotic heart disease of native coronary artery without angina pectoris: Secondary | ICD-10-CM | POA: Diagnosis present

## 2017-07-11 DIAGNOSIS — E785 Hyperlipidemia, unspecified: Secondary | ICD-10-CM | POA: Diagnosis present

## 2017-07-11 DIAGNOSIS — L03115 Cellulitis of right lower limb: Secondary | ICD-10-CM

## 2017-07-11 DIAGNOSIS — E1129 Type 2 diabetes mellitus with other diabetic kidney complication: Secondary | ICD-10-CM | POA: Diagnosis present

## 2017-07-11 DIAGNOSIS — E1122 Type 2 diabetes mellitus with diabetic chronic kidney disease: Secondary | ICD-10-CM | POA: Diagnosis present

## 2017-07-11 DIAGNOSIS — M25512 Pain in left shoulder: Secondary | ICD-10-CM | POA: Diagnosis not present

## 2017-07-11 DIAGNOSIS — L8990 Pressure ulcer of unspecified site, unspecified stage: Secondary | ICD-10-CM

## 2017-07-11 DIAGNOSIS — Z888 Allergy status to other drugs, medicaments and biological substances status: Secondary | ICD-10-CM

## 2017-07-11 DIAGNOSIS — K219 Gastro-esophageal reflux disease without esophagitis: Secondary | ICD-10-CM | POA: Diagnosis present

## 2017-07-11 LAB — COMPREHENSIVE METABOLIC PANEL
ALK PHOS: 160 U/L — AB (ref 38–126)
ALT: 24 U/L (ref 17–63)
ANION GAP: 14 (ref 5–15)
AST: 26 U/L (ref 15–41)
Albumin: 2.3 g/dL — ABNORMAL LOW (ref 3.5–5.0)
BUN: 29 mg/dL — AB (ref 6–20)
CO2: 27 mmol/L (ref 22–32)
Calcium: 7.5 mg/dL — ABNORMAL LOW (ref 8.9–10.3)
Chloride: 92 mmol/L — ABNORMAL LOW (ref 101–111)
Creatinine, Ser: 5.43 mg/dL — ABNORMAL HIGH (ref 0.61–1.24)
GFR calc non Af Amer: 9 mL/min — ABNORMAL LOW (ref >60)
GFR, EST AFRICAN AMERICAN: 10 mL/min — AB (ref >60)
GLUCOSE: 387 mg/dL — AB (ref 65–99)
Potassium: 4.6 mmol/L (ref 3.5–5.1)
SODIUM: 133 mmol/L — AB (ref 135–145)
Total Bilirubin: 0.5 mg/dL (ref 0.3–1.2)
Total Protein: 7.1 g/dL (ref 6.5–8.1)

## 2017-07-11 LAB — CBC WITH DIFFERENTIAL/PLATELET
Basophils Absolute: 0 10*3/uL (ref 0.0–0.1)
Basophils Relative: 0 %
Eosinophils Absolute: 0.1 10*3/uL (ref 0.0–0.7)
Eosinophils Relative: 0 %
HCT: 33 % — ABNORMAL LOW (ref 39.0–52.0)
Hemoglobin: 10.1 g/dL — ABNORMAL LOW (ref 13.0–17.0)
LYMPHS ABS: 0.6 10*3/uL — AB (ref 0.7–4.0)
Lymphocytes Relative: 4 %
MCH: 26.4 pg (ref 26.0–34.0)
MCHC: 30.6 g/dL (ref 30.0–36.0)
MCV: 86.2 fL (ref 78.0–100.0)
MONO ABS: 1.3 10*3/uL — AB (ref 0.1–1.0)
MONOS PCT: 8 %
Neutro Abs: 13.6 10*3/uL — ABNORMAL HIGH (ref 1.7–7.7)
Neutrophils Relative %: 88 %
PLATELETS: 202 10*3/uL (ref 150–400)
RBC: 3.83 MIL/uL — ABNORMAL LOW (ref 4.22–5.81)
RDW: 16.5 % — AB (ref 11.5–15.5)
WBC: 15.6 10*3/uL — ABNORMAL HIGH (ref 4.0–10.5)

## 2017-07-11 LAB — I-STAT CG4 LACTIC ACID, ED: Lactic Acid, Venous: 1.24 mmol/L (ref 0.5–1.9)

## 2017-07-11 NOTE — ED Triage Notes (Addendum)
Pt sent from vascular office/ Pt has had infection secondary to failed revascularization to right foot for 5 weeks. Pt was told that he might have to have an amputation. Dressing was changed at MD office is noted to have serosangious drainage and foul smelling odor. The pt was sent here for admission, pain is worsening. Denies leg pain. Dialysis pt.

## 2017-07-11 NOTE — Progress Notes (Signed)
Patient ID: Charles Vasquez, male   DOB: [birth year], 82 y.o.   MRN: [id number]  Reason for Consult: Follow-up (2-3 wk f/u )   Referred by Toma Deiters, MD  Subjective:     HPI:  Charles Vasquez is a 82 y.o. male with right heel ulceration recently underwent right lower extremity angiogram attempted revascularization of his right posterior tibial artery that was unsuccessful.  He has since had progressive worsening of his heel wound and now has significant pain.  His temperature has been elevated to 99 but he has not had any frank fevers or chills at home.  He is not on any blood thinners.  Past Medical History:  Diagnosis Date  . Anal pain   . AVF (arteriovenous fistula) (HCC) 05-13-2017 per pt currently AVF access used is left thigh   hx multiple AVF surgery's (previously left upper arm, bilateral thigh) last surgery -- right upper arm creation 11-26-2016  . ESRD (end stage renal disease) on dialysis [facility])    [facility] in [place], [place] on MWF   . History of CVA (cerebrovascular accident)    05-13-2017  per pt stated "thats what they told yrs ago"  per pt no residual  . Hyperlipidemia   . Hypertension   . Myocardial infarction (HCC)   . Stroke (HCC)   . Type 2 diabetes mellitus treated with insulin (HCC)    Family History  Problem Relation Age of Onset  . Diabetes Mother   . Hypertension Mother   . AAA (abdominal aortic aneurysm) Mother   . Diabetes Father   . Hypertension Father    Past Surgical History:  Procedure Laterality Date  . ABDOMINAL AORTOGRAM W/LOWER EXTREMITY N/A 06/25/2017   Procedure: ABDOMINAL AORTOGRAM W/LOWER EXTREMITY;  Surgeon: Maeola Harman, MD;  Location: [facility] INVASIVE CV LAB;  Service: Cardiovascular;  Laterality: N/A;  . AMPUTATION Right 01/01/2017   Procedure: REVISION AMPUTATION RIGHT INDEX FINGER;  Surgeon: Dairl Ponder, MD;  Location: [facility] OR;  Service: Orthopedics;  Laterality: Right;  . ARTERIOVENOUS GRAFT PLACEMENT    .  ARTERIOVENOUS GRAFT PLACEMENT Left 10/22/2016   thigh  . AV FISTULA PLACEMENT  03/31/2012   Procedure: ARTERIOVENOUS (AV) FISTULA CREATION;  Surgeon: [name], MD;  Location: [facility] OR;  Service: Vascular;  Laterality: Right;  First stage Brachial vein transposition  . AV FISTULA PLACEMENT Right 08/25/2012   Procedure: INSERTION OF ARTERIOVENOUS (AV) GORE-TEX GRAFT ARM;  Surgeon: [name], MD;  Location: [facility] OR;  Service: Vascular;  Laterality: Right;  . AV FISTULA PLACEMENT Left 07/23/2016   Procedure: INSERTION OF ARTERIOVENOUS (AV) GORE-TEX GRAFT LEFT UPPER ARM;  Surgeon: [name], MD;  Location: [facility] OR;  Service: Vascular;  Laterality: Left;  . AV FISTULA PLACEMENT Left 10/22/2016   Procedure: INSERTION OF 4-33mm x 45cm  ARTERIOVENOUS (AV) GORE-TEX GRAFT THIGH-LEFT;  Surgeon: [name], MD;  Location: [facility] OR;  Service: Vascular;  Laterality: Left;  . CATARACT EXTRACTION W/ INTRAOCULAR LENS  IMPLANT, BILATERAL    . COLONOSCOPY N/A 03/25/2017   Dr. Darrick Penna: examined portion of ileum normal. Redundant left colon, stricture at anus   . ESOPHAGOGASTRODUODENOSCOPY N/A 03/25/2017   Dr. Darrick Penna: widely patent Schatzki ring at GE junction s/p dilation, atrophic gastritis, single duodenal AVM, non-bleeding  . INSERTION OF DIALYSIS CATHETER Left 05/05/2012   Procedure: INSERTION OF DIALYSIS CATHETER;  Surgeon: Chuck Hint, MD;  Location: [facility] OR;  Service: Vascular;  Laterality: Left;  . INSERTION OF  DIALYSIS CATHETER Right 10/01/2016   Procedure: INSERTION OF DIALYSIS CATHETER- RIGHT FEMORAL;  Surgeon: [name], MD;  Location: [facility] OR;  Service: Vascular;  Laterality: Right;  . IR FLUORO GUIDE CV LINE RIGHT  06/10/2016  . IR GENERIC HISTORICAL  06/07/2016   IR US GUIDE VASC ACCESS RIGHT 06/07/2016 Oley Balm, MD [facility]-INTERV RAD  . IR THROMBECTOMY AV FISTULA W/THROMBOLYSIS/PTA INC/SHUNT/IMG RIGHT Right 06/07/2016  . IR US GUIDE VASC ACCESS RIGHT  06/10/2016  . LIGATION ARTERIOVENOUS GORTEX  GRAFT Left 08/04/2016   Procedure: LIGATION ARTERIOVENOUS GORTEX GRAFT;  Surgeon: Larina Earthly, MD;  Location: [facility] OR;  Service: Vascular;  Laterality: Left;  . LIGATION ARTERIOVENOUS GORTEX GRAFT Right 11/26/2016   Procedure: LIGATION OF RIGHT UPPER ARM ARTERIOVENOUS GORTEX GRAFT;  Surgeon: [name], MD;  Location: [facility] OR;  Service: Vascular;  Laterality: Right;  . LIGATION OF ARTERIOVENOUS  FISTULA Right 08/25/2012   Procedure: LIGATION OF ARTERIOVENOUS  FISTULA;  Surgeon: [name], MD;  Location: [facility] OR;  Service: Vascular;  Laterality: Right;  Ligation of right brachial vein transposition  . PERIPHERAL VASCULAR ATHERECTOMY  06/25/2017   Procedure: PERIPHERAL VASCULAR ATHERECTOMY;  Surgeon: Maeola Harman, MD;  Location: [facility] INVASIVE CV LAB;  Service: Cardiovascular;;  Rt. PT  . REMOVAL OF A DIALYSIS CATHETER Right 05/05/2012   Procedure: REMOVAL OF A DIALYSIS CATHETER;  Surgeon: Chuck Hint, MD;  Location: [facility] OR;  Service: Vascular;  Laterality: Right;  . REMOVAL OF A DIALYSIS CATHETER Right 10/01/2016   Procedure: REMOVAL OF A DIALYSIS CATHETER-RIGHT UPPER CHEST;  Surgeon: [name], MD;  Location: [facility] OR;  Service: Vascular;  Laterality: Right;  . REMOVAL OF A DIALYSIS CATHETER Right 11/26/2016   Procedure: REMOVAL OF RIGHT FEMORAL TUNNEL DIALYSIS CATHETER;  Surgeon: [name], MD;  Location: [facility] OR;  Service: Vascular;  Laterality: Right;  . SPHINCTEROTOMY N/A 05/15/2017   Procedure: POSSIBLE SPHINCTEROTOMY (BOTOX);  Surgeon: Romie Levee, MD;  Location: [facility];  Service: General;  Laterality: N/A;  . TOE AMPUTATION  02/2007   left foot first 3 toes  . UPPER EXTREMITY VENOGRAPHY Bilateral 07/18/2016   Procedure: Bilateral Upper Extremity Venography;  Surgeon: [name], MD;  Location: [facility] INVASIVE CV LAB;  Service: Cardiovascular;  Laterality: Bilateral;    Short Social History:  Social History   Tobacco Use  . Smoking status: Never  Smoker  . Smokeless tobacco: Never Used  Substance Use Topics  . Alcohol use: No    Allergies  Allergen Reactions  . Lisinopril Other (See Comments)    HYPERKALEMIA RENAL DYSFUNCTION PROGRESSION    Current Outpatient Medications  Medication Sig Dispense Refill  . calcium carbonate (TUMS - DOSED IN MG ELEMENTAL CALCIUM) 500 MG chewable tablet Chew 1 tablet by mouth daily as needed for indigestion or heartburn.    . carboxymethylcellulose (REFRESH PLUS) 0.5 % SOLN Place 1 drop into both eyes 3 (three) times daily as needed (dry eyes). AS DIRECTED     . insulin glargine (LANTUS) 100 UNIT/ML injection Inject 5 Units into the skin at bedtime.     Marland Kitchen losartan (COZAAR) 50 MG tablet Take 50 mg by mouth every evening.     . multivitamin (RENA-VIT) TABS tablet Take 1 tablet by mouth daily.    . pantoprazole (PROTONIX) 40 MG tablet 1 PO 30 MINUTES PRIOR TO MEALS BID FOR 3 MOS THEN QD (Patient taking differently: Take 40 mg by mouth See admin instructions. 30 MINUTES  PRIOR TO MEALS BID FOR 3 MOS THEN QD) 60 tablet 11  . sevelamer carbonate (RENVELA) 800 MG tablet Take 1,600 mg by mouth 3 (three) times daily with meals. Supposed to take one 800 mg with snacks and caregiver said he only does that sometimes    . SSD 1 % cream Apply 1 application topically daily.    Marland Kitchen dicyclomine (BENTYL) 10 MG capsule 1 PO 30 MINUTES PRIOR TO BREAKFAST AND LUNCH (Patient not taking: Reported on 06/23/2017) 62 capsule 11  . Polysacch Fe Cmp-Fe Heme Poly (BIFERA) 28 MG TABS 1 po bid (Patient not taking: Reported on 05/20/2017) 60 tablet 11  . traMADol (ULTRAM) 50 MG tablet Take 1 tablet (50 mg total) by mouth every 6 (six) hours as needed. (Patient not taking: Reported on 06/23/2017) 30 tablet 0   No current facility-administered medications for this visit.     Review of Systems  Constitutional: Positive for fever.  HENT: HENT negative.  Respiratory: Respiratory negative.  Cardiovascular: Cardiovascular negative.  GI:  Gastrointestinal negative.  Musculoskeletal: Positive for leg pain.  Skin: Positive for wound.  Neurological: Neurological negative. Hematologic: Hematologic/lymphatic negative.        Objective:  Objective   Vitals:   07/11/17 1540  BP: (!) 156/72  Pulse: 85  Resp: 20  Temp: 99.8 F (37.7 C)  TempSrc: Oral  SpO2: 100%  Weight: 130 lb (59 kg)  Height: 5\' 7"  (1.702 m)   Body mass index is 20.36 kg/m.  Physical Exam  Constitutional: No distress.  HENT:  Head: Normocephalic.  Cardiovascular: Normal rate.  Pulmonary/Chest: Effort normal.  Abdominal: Soft.  Musculoskeletal:  Right foot with foul smelling heel ulceration  Neurological: He is alert.         Assessment/Plan:     82 year old male with recent history attempted revascularization right lower extremity.  He is end-stage renal disease on dialysis via left femoral graft.  I have offered him to admission to the hospital but it appears to descensus that she is direct admission will not be possible.  The options therefore are emergency department admission to the medical team for ongoing pain and concern for infection and need for antibiotics and dialysis and he will get below-knee amputation early next week with the other option being waiting till next week to get the amputation.  Family states patient cannot wait given the pain and they will go to the emergency department today.  We have called the charge nurse to give them a heads up.     Maeola Harman MD [facility]

## 2017-07-12 ENCOUNTER — Encounter (HOSPITAL_COMMUNITY): Payer: Self-pay

## 2017-07-12 ENCOUNTER — Other Ambulatory Visit: Payer: Self-pay

## 2017-07-12 DIAGNOSIS — D62 Acute posthemorrhagic anemia: Secondary | ICD-10-CM | POA: Diagnosis not present

## 2017-07-12 DIAGNOSIS — I2510 Atherosclerotic heart disease of native coronary artery without angina pectoris: Secondary | ICD-10-CM | POA: Diagnosis not present

## 2017-07-12 DIAGNOSIS — E11649 Type 2 diabetes mellitus with hypoglycemia without coma: Secondary | ICD-10-CM | POA: Diagnosis not present

## 2017-07-12 DIAGNOSIS — Z8673 Personal history of transient ischemic attack (TIA), and cerebral infarction without residual deficits: Secondary | ICD-10-CM | POA: Diagnosis not present

## 2017-07-12 DIAGNOSIS — I169 Hypertensive crisis, unspecified: Secondary | ICD-10-CM | POA: Diagnosis not present

## 2017-07-12 DIAGNOSIS — I96 Gangrene, not elsewhere classified: Secondary | ICD-10-CM | POA: Diagnosis not present

## 2017-07-12 DIAGNOSIS — S91301A Unspecified open wound, right foot, initial encounter: Secondary | ICD-10-CM

## 2017-07-12 DIAGNOSIS — N2581 Secondary hyperparathyroidism of renal origin: Secondary | ICD-10-CM | POA: Diagnosis not present

## 2017-07-12 DIAGNOSIS — E1152 Type 2 diabetes mellitus with diabetic peripheral angiopathy with gangrene: Secondary | ICD-10-CM | POA: Diagnosis not present

## 2017-07-12 DIAGNOSIS — K219 Gastro-esophageal reflux disease without esophagitis: Secondary | ICD-10-CM | POA: Diagnosis not present

## 2017-07-12 DIAGNOSIS — E1122 Type 2 diabetes mellitus with diabetic chronic kidney disease: Secondary | ICD-10-CM | POA: Diagnosis not present

## 2017-07-12 DIAGNOSIS — N186 End stage renal disease: Secondary | ICD-10-CM | POA: Diagnosis not present

## 2017-07-12 DIAGNOSIS — L089 Local infection of the skin and subcutaneous tissue, unspecified: Secondary | ICD-10-CM | POA: Diagnosis not present

## 2017-07-12 DIAGNOSIS — Z992 Dependence on renal dialysis: Secondary | ICD-10-CM | POA: Diagnosis not present

## 2017-07-12 DIAGNOSIS — Z961 Presence of intraocular lens: Secondary | ICD-10-CM | POA: Diagnosis not present

## 2017-07-12 DIAGNOSIS — Z89422 Acquired absence of other left toe(s): Secondary | ICD-10-CM | POA: Diagnosis not present

## 2017-07-12 DIAGNOSIS — E1151 Type 2 diabetes mellitus with diabetic peripheral angiopathy without gangrene: Secondary | ICD-10-CM | POA: Diagnosis not present

## 2017-07-12 DIAGNOSIS — Z89021 Acquired absence of right finger(s): Secondary | ICD-10-CM | POA: Diagnosis not present

## 2017-07-12 DIAGNOSIS — I10 Essential (primary) hypertension: Secondary | ICD-10-CM | POA: Diagnosis not present

## 2017-07-12 DIAGNOSIS — L89151 Pressure ulcer of sacral region, stage 1: Secondary | ICD-10-CM | POA: Diagnosis present

## 2017-07-12 DIAGNOSIS — E1142 Type 2 diabetes mellitus with diabetic polyneuropathy: Secondary | ICD-10-CM | POA: Diagnosis not present

## 2017-07-12 DIAGNOSIS — Z833 Family history of diabetes mellitus: Secondary | ICD-10-CM | POA: Diagnosis not present

## 2017-07-12 DIAGNOSIS — E11628 Type 2 diabetes mellitus with other skin complications: Secondary | ICD-10-CM | POA: Diagnosis not present

## 2017-07-12 DIAGNOSIS — Z4781 Encounter for orthopedic aftercare following surgical amputation: Secondary | ICD-10-CM | POA: Diagnosis not present

## 2017-07-12 DIAGNOSIS — E1121 Type 2 diabetes mellitus with diabetic nephropathy: Secondary | ICD-10-CM | POA: Diagnosis not present

## 2017-07-12 DIAGNOSIS — Z8249 Family history of ischemic heart disease and other diseases of the circulatory system: Secondary | ICD-10-CM | POA: Diagnosis not present

## 2017-07-12 DIAGNOSIS — S88111A Complete traumatic amputation at level between knee and ankle, right lower leg, initial encounter: Secondary | ICD-10-CM | POA: Diagnosis not present

## 2017-07-12 DIAGNOSIS — E1129 Type 2 diabetes mellitus with other diabetic kidney complication: Secondary | ICD-10-CM | POA: Diagnosis present

## 2017-07-12 DIAGNOSIS — R402413 Glasgow coma scale score 13-15, at hospital admission: Secondary | ICD-10-CM | POA: Diagnosis present

## 2017-07-12 DIAGNOSIS — Z79899 Other long term (current) drug therapy: Secondary | ICD-10-CM | POA: Diagnosis not present

## 2017-07-12 DIAGNOSIS — Z9842 Cataract extraction status, left eye: Secondary | ICD-10-CM | POA: Diagnosis not present

## 2017-07-12 DIAGNOSIS — I252 Old myocardial infarction: Secondary | ICD-10-CM | POA: Diagnosis not present

## 2017-07-12 DIAGNOSIS — Z888 Allergy status to other drugs, medicaments and biological substances status: Secondary | ICD-10-CM | POA: Diagnosis not present

## 2017-07-12 DIAGNOSIS — Z794 Long term (current) use of insulin: Secondary | ICD-10-CM | POA: Diagnosis not present

## 2017-07-12 DIAGNOSIS — I739 Peripheral vascular disease, unspecified: Secondary | ICD-10-CM | POA: Diagnosis present

## 2017-07-12 DIAGNOSIS — I998 Other disorder of circulatory system: Secondary | ICD-10-CM | POA: Diagnosis not present

## 2017-07-12 DIAGNOSIS — E785 Hyperlipidemia, unspecified: Secondary | ICD-10-CM | POA: Diagnosis not present

## 2017-07-12 DIAGNOSIS — I120 Hypertensive chronic kidney disease with stage 5 chronic kidney disease or end stage renal disease: Secondary | ICD-10-CM | POA: Diagnosis not present

## 2017-07-12 DIAGNOSIS — L03115 Cellulitis of right lower limb: Secondary | ICD-10-CM | POA: Diagnosis not present

## 2017-07-12 DIAGNOSIS — R7881 Bacteremia: Secondary | ICD-10-CM | POA: Diagnosis present

## 2017-07-12 DIAGNOSIS — Z89511 Acquired absence of right leg below knee: Secondary | ICD-10-CM | POA: Diagnosis not present

## 2017-07-12 DIAGNOSIS — D631 Anemia in chronic kidney disease: Secondary | ICD-10-CM | POA: Diagnosis not present

## 2017-07-12 DIAGNOSIS — Z9841 Cataract extraction status, right eye: Secondary | ICD-10-CM | POA: Diagnosis not present

## 2017-07-12 LAB — BLOOD CULTURE ID PANEL (REFLEXED)
ACINETOBACTER BAUMANNII: NOT DETECTED
CANDIDA ALBICANS: NOT DETECTED
CANDIDA GLABRATA: NOT DETECTED
CANDIDA KRUSEI: NOT DETECTED
CANDIDA PARAPSILOSIS: NOT DETECTED
Candida tropicalis: NOT DETECTED
Carbapenem resistance: NOT DETECTED
ENTEROBACTER CLOACAE COMPLEX: NOT DETECTED
ENTEROBACTERIACEAE SPECIES: DETECTED — AB
Enterococcus species: NOT DETECTED
Escherichia coli: NOT DETECTED
Haemophilus influenzae: NOT DETECTED
KLEBSIELLA OXYTOCA: NOT DETECTED
KLEBSIELLA PNEUMONIAE: NOT DETECTED
Listeria monocytogenes: NOT DETECTED
Neisseria meningitidis: NOT DETECTED
PSEUDOMONAS AERUGINOSA: NOT DETECTED
Proteus species: DETECTED — AB
Serratia marcescens: NOT DETECTED
Staphylococcus aureus (BCID): NOT DETECTED
Staphylococcus species: NOT DETECTED
Streptococcus agalactiae: NOT DETECTED
Streptococcus pneumoniae: NOT DETECTED
Streptococcus pyogenes: NOT DETECTED
Streptococcus species: DETECTED — AB

## 2017-07-12 LAB — PROTIME-INR
INR: 1.12
PROTHROMBIN TIME: 14.3 s (ref 11.4–15.2)

## 2017-07-12 LAB — GLUCOSE, CAPILLARY
GLUCOSE-CAPILLARY: 303 mg/dL — AB (ref 65–99)
GLUCOSE-CAPILLARY: 46 mg/dL — AB (ref 65–99)
GLUCOSE-CAPILLARY: 190 mg/dL — AB (ref 65–99)
Glucose-Capillary: 75 mg/dL (ref 65–99)
Glucose-Capillary: 127 mg/dL — ABNORMAL HIGH (ref 65–99)
Glucose-Capillary: 249 mg/dL — ABNORMAL HIGH (ref 65–99)

## 2017-07-12 LAB — APTT: APTT: 42 s — AB (ref 24–36)

## 2017-07-12 LAB — SEDIMENTATION RATE: Sed Rate: 110 mm/hr — ABNORMAL HIGH (ref 0–16)

## 2017-07-12 LAB — MRSA PCR SCREENING: MRSA BY PCR: NEGATIVE

## 2017-07-12 LAB — PROCALCITONIN: PROCALCITONIN: 21.59 ng/mL

## 2017-07-12 LAB — C-REACTIVE PROTEIN: CRP: 21.4 mg/dL — ABNORMAL HIGH (ref <1.0)

## 2017-07-12 LAB — LACTIC ACID, PLASMA: Lactic Acid, Venous: 1.3 mmol/L (ref 0.5–1.9)

## 2017-07-12 MED ORDER — TRAMADOL HCL 50 MG PO TABS
50.0000 mg | ORAL_TABLET | Freq: Four times a day (QID) | ORAL | Status: DC | PRN
  Administered 2017-07-14 – 2017-07-17 (×6): 50 mg via ORAL
  Filled 2017-07-12 (×6): qty 1

## 2017-07-12 MED ORDER — FENTANYL CITRATE (PF) 100 MCG/2ML IJ SOLN
50.0000 ug | Freq: Once | INTRAMUSCULAR | Status: AC
  Administered 2017-07-12: 50 ug via INTRAVENOUS
  Filled 2017-07-12: qty 2

## 2017-07-12 MED ORDER — ACETAMINOPHEN 650 MG RE SUPP: 650.0000 mg | Freq: Four times a day (QID) | RECTAL | Status: DC | PRN

## 2017-07-12 MED ORDER — RENA-VITE PO TABS
1.0000 | ORAL_TABLET | Freq: Every day | ORAL | Status: DC
  Administered 2017-07-12 – 2017-07-17 (×5): 1 via ORAL
  Filled 2017-07-12 (×5): qty 1

## 2017-07-12 MED ORDER — DEXTROSE 50 % IV SOLN
INTRAVENOUS | Status: AC
  Filled 2017-07-12: qty 50

## 2017-07-12 MED ORDER — POLYSACCHARIDE IRON COMPLEX 150 MG PO CAPS
150.0000 mg | ORAL_CAPSULE | Freq: Two times a day (BID) | ORAL | Status: DC
  Administered 2017-07-12 – 2017-07-17 (×10): 150 mg via ORAL
  Filled 2017-07-12 (×10): qty 1

## 2017-07-12 MED ORDER — HEPARIN SODIUM (PORCINE) 5000 UNIT/ML IJ SOLN
5000.0000 [IU] | Freq: Three times a day (TID) | INTRAMUSCULAR | Status: DC
  Administered 2017-07-12 – 2017-07-14 (×9): 5000 [IU] via SUBCUTANEOUS
  Filled 2017-07-12 (×7): qty 1

## 2017-07-12 MED ORDER — VANCOMYCIN HCL 500 MG IV SOLR: 500.0000 mg | INTRAVENOUS | Status: DC

## 2017-07-12 MED ORDER — INSULIN ASPART 100 UNIT/ML ~~LOC~~ SOLN
0.0000 [IU] | Freq: Three times a day (TID) | SUBCUTANEOUS | Status: DC
  Administered 2017-07-12: 2 [IU] via SUBCUTANEOUS
  Administered 2017-07-12: 7 [IU] via SUBCUTANEOUS
  Administered 2017-07-13: 5 [IU] via SUBCUTANEOUS
  Administered 2017-07-13: 7 [IU] via SUBCUTANEOUS
  Administered 2017-07-13 – 2017-07-14 (×2): 3 [IU] via SUBCUTANEOUS
  Administered 2017-07-15: 5 [IU] via SUBCUTANEOUS
  Administered 2017-07-16: 3 [IU] via SUBCUTANEOUS

## 2017-07-12 MED ORDER — DICYCLOMINE HCL 10 MG PO CAPS
10.0000 mg | ORAL_CAPSULE | Freq: Two times a day (BID) | ORAL | Status: DC
  Administered 2017-07-12 – 2017-07-17 (×8): 10 mg via ORAL
  Filled 2017-07-12 (×12): qty 1

## 2017-07-12 MED ORDER — SEVELAMER CARBONATE 800 MG PO TABS
1600.0000 mg | ORAL_TABLET | Freq: Three times a day (TID) | ORAL | Status: DC
  Administered 2017-07-12 – 2017-07-17 (×14): 1600 mg via ORAL
  Filled 2017-07-12 (×14): qty 2

## 2017-07-12 MED ORDER — PIPERACILLIN-TAZOBACTAM 3.375 G IVPB
3.3750 g | Freq: Two times a day (BID) | INTRAVENOUS | Status: DC
  Administered 2017-07-12 – 2017-07-16 (×7): 3.375 g via INTRAVENOUS
  Filled 2017-07-12 (×9): qty 50

## 2017-07-12 MED ORDER — SILVER SULFADIAZINE 1 % EX CREA
1.0000 "application " | TOPICAL_CREAM | Freq: Every day | CUTANEOUS | Status: DC
  Administered 2017-07-12 – 2017-07-14 (×3): 1 via TOPICAL
  Filled 2017-07-12: qty 85

## 2017-07-12 MED ORDER — INSULIN GLARGINE 100 UNIT/ML ~~LOC~~ SOLN: 5.0000 [IU] | Freq: Every day | SUBCUTANEOUS | Status: DC

## 2017-07-12 MED ORDER — SENNOSIDES-DOCUSATE SODIUM 8.6-50 MG PO TABS: 1.0000 | ORAL_TABLET | Freq: Every evening | ORAL | Status: DC | PRN

## 2017-07-12 MED ORDER — PANTOPRAZOLE SODIUM 40 MG PO TBEC
40.0000 mg | DELAYED_RELEASE_TABLET | Freq: Two times a day (BID) | ORAL | Status: DC
  Administered 2017-07-12 – 2017-07-17 (×9): 40 mg via ORAL
  Filled 2017-07-12 (×9): qty 1

## 2017-07-12 MED ORDER — PIPERACILLIN-TAZOBACTAM 3.375 G IVPB 30 MIN
3.3750 g | Freq: Once | INTRAVENOUS | Status: AC
  Administered 2017-07-12: 3.375 g via INTRAVENOUS
  Filled 2017-07-12: qty 50

## 2017-07-12 MED ORDER — ZOLPIDEM TARTRATE 5 MG PO TABS: 5.0000 mg | ORAL_TABLET | Freq: Every evening | ORAL | Status: DC | PRN

## 2017-07-12 MED ORDER — ONDANSETRON HCL 4 MG/2ML IJ SOLN: 4.0000 mg | Freq: Four times a day (QID) | INTRAMUSCULAR | Status: DC | PRN

## 2017-07-12 MED ORDER — CALCIUM CARBONATE ANTACID 500 MG PO CHEW: 1.0000 | CHEWABLE_TABLET | Freq: Every day | ORAL | Status: DC | PRN

## 2017-07-12 MED ORDER — HYDRALAZINE HCL 20 MG/ML IJ SOLN
5.0000 mg | INTRAMUSCULAR | Status: DC | PRN
  Administered 2017-07-12 – 2017-07-13 (×3): 5 mg via INTRAVENOUS
  Filled 2017-07-12 (×3): qty 1

## 2017-07-12 MED ORDER — LOSARTAN POTASSIUM 50 MG PO TABS
50.0000 mg | ORAL_TABLET | Freq: Every evening | ORAL | Status: DC
  Administered 2017-07-12 – 2017-07-14 (×3): 50 mg via ORAL
  Filled 2017-07-12 (×4): qty 1

## 2017-07-12 MED ORDER — ONDANSETRON HCL 4 MG PO TABS: 4.0000 mg | ORAL_TABLET | Freq: Four times a day (QID) | ORAL | Status: DC | PRN

## 2017-07-12 MED ORDER — VANCOMYCIN HCL 10 G IV SOLR
1250.0000 mg | Freq: Once | INTRAVENOUS | Status: AC
  Administered 2017-07-12: 1250 mg via INTRAVENOUS
  Filled 2017-07-12: qty 1250

## 2017-07-12 MED ORDER — ACETAMINOPHEN 325 MG PO TABS
650.0000 mg | ORAL_TABLET | Freq: Four times a day (QID) | ORAL | Status: DC | PRN
  Administered 2017-07-12: 650 mg via ORAL
  Filled 2017-07-12: qty 2

## 2017-07-12 MED ORDER — POLYVINYL ALCOHOL 1.4 % OP SOLN: 1.0000 [drp] | Freq: Three times a day (TID) | OPHTHALMIC | Status: DC | PRN

## 2017-07-12 MED ORDER — LOPERAMIDE HCL 2 MG PO CAPS
2.0000 mg | ORAL_CAPSULE | Freq: Three times a day (TID) | ORAL | Status: DC | PRN
Start: 2017-07-12 — End: 2017-07-17
  Administered 2017-07-12: 2 mg via ORAL
  Filled 2017-07-12: qty 1

## 2017-07-12 NOTE — Progress Notes (Signed)
Patient arrived on the unit from the ER on a stretcher, placed on  Tele ccmd notified, assessment completed see flow sheet, patient oriented to room and staff bed in lowest position call bell within reach will continue to monitor.

## 2017-07-12 NOTE — Progress Notes (Signed)
Pharmacy Antibiotic Note  Charles Vasquez is a 82 y.o. male sent to the ED on 07/11/2017 by vascular surgery for plan for BKA after progressive worsening of heel wound.  Pharmacy has been consulted for vancomycin dosing.  Plan: Vancomycin 1250mg  x1 then 500mg  IV every HD.  Goal pre-HD level 15-25 mcg/mL.  Height: 5\' 7"  (170.2 cm) Weight: 130 lb (59 kg) IBW/kg (Calculated) : 66.1  Temp (24hrs), Avg:99.3 F (37.4 C), Min:98.7 F (37.1 C), Max:99.8 F (37.7 C)  Recent Labs  Lab 07/11/17 1754 07/11/17 1813  WBC 15.6*  --   CREATININE 5.43*  --   LATICACIDVEN  --  1.24    Estimated Creatinine Clearance: 9.2 mL/min (A) (by C-G formula based on SCr of 5.43 mg/dL (H)).    Allergies  Allergen Reactions  . Lisinopril Other (See Comments)    HYPERKALEMIA RENAL DYSFUNCTION PROGRESSION     Thank you for allowing pharmacy to be a part of this patient's care.  Vernard Gambles, PharmD, BCPS  07/12/2017 1:09 AM

## 2017-07-12 NOTE — H&P (Signed)
History and Physical    Charles Vasquez IWL:[id number] DOB: [birth year] DOA: 07/11/2017  Referring MD/NP/PA:   PCP: Neale Burly, MD   Patient coming from:  The patient is coming from home.  At baseline, pt is independent for most of ADL.    Chief Complaint: right foot wound infection  HPI: Charles Vasquez is a 82 y.o. male with medical history significant of hypertension, hyperlipidemia, diabetes mellitus, stroke, GERD, ESRD-HD (MWF), CAD, PVD, who presents with right foot wound infection.  Pt recently underwent right lower extremity angiogram with attempted revascularization of his right posterior tibial artery that was unsuccessful 5 weeks ago. Pt has a right foot heel wound infection, with serosangious drainage and foul smelling odor. Pt was seen by [facility], Dr. Donzetta Matters today. Dressing was changed at office. Pt was told that he might have to have an amputation. Pt was sent to ED for admission for antibiotics and pain control.  Patient states that he has some moderate of pain, but no fever or chills.  He denies chest pain, shortness of breath, cough, nausea, vomiting, diarrhea or abdominal pain no symptoms of UTI or unilateral weakness.  Patient states that he had a dialysis on Friday.  ED Course: pt was found to have WBC 15.6, lactic acid of 1.24, potassium 4.6, bicarbonate 27, creatinine 5.43, BUN 29, temperature 99.8, no tachycardia, no tachypnea, oxygen saturation 98% on room air.  Patient is admitted to telemetry bed as inpatient.  Review of Systems:   General: no fevers, chills, has fatigue HEENT: no blurry vision, hearing changes or sore throat Respiratory: no dyspnea, coughing, wheezing CV: no chest pain, no palpitations GI: no nausea, vomiting, abdominal pain, diarrhea, constipation GU: no dysuria, burning on urination, increased urinary frequency, hematuria  Ext: no leg edema Neuro: no unilateral weakness, numbness, or tingling, no vision change or hearing loss Skin: has right foot  heel wound infection. MSK: No muscle spasm, no deformity, no limitation of range of movement in spin Heme: No easy bruising.  Travel history: No recent long distant travel.  Allergy:  Allergies  Allergen Reactions  . Lisinopril Other (See Comments)    HYPERKALEMIA RENAL DYSFUNCTION PROGRESSION    Past Medical History:  Diagnosis Date  . Anal pain   . AVF (arteriovenous fistula) ([place]) 05-13-2017 per pt currently AVF access used is left thigh   hx multiple AVF surgery's (previously left upper arm, bilateral thigh) last surgery -- right upper arm creation 11-26-2016  . ESRD (end stage renal disease) on dialysis [facility])    [facility] in [place], [place] on MWF   . History of CVA (cerebrovascular accident)    05-13-2017  per pt stated "thats what they told yrs ago"  per pt no residual  . Hyperlipidemia   . Hypertension   . Myocardial infarction ([place])   . Stroke ([place])   . Type 2 diabetes mellitus treated with insulin [facility])     Past Surgical History:  Procedure Laterality Date  . ABDOMINAL AORTOGRAM W/LOWER EXTREMITY N/A 06/25/2017   Procedure: ABDOMINAL AORTOGRAM W/LOWER EXTREMITY;  Surgeon: Waynetta Sandy, MD;  Location: [place] [facility];  Service: Cardiovascular;  Laterality: N/A;  . AMPUTATION Right 01/01/2017   Procedure: REVISION AMPUTATION RIGHT INDEX FINGER;  Surgeon: Charlotte Crumb, MD;  Location: [place];  Service: Orthopedics;  Laterality: Right;  . ARTERIOVENOUS GRAFT PLACEMENT    . ARTERIOVENOUS GRAFT PLACEMENT Left 10/22/2016   thigh  . AV FISTULA PLACEMENT  03/31/2012   Procedure: ARTERIOVENOUS (  AV) FISTULA CREATION;  Surgeon: Conrad Greasewood, MD;  Location: [place];  Service: Vascular;  Laterality: Right;  First stage Brachial vein transposition  . AV FISTULA PLACEMENT Right 08/25/2012   Procedure: INSERTION OF ARTERIOVENOUS (AV) GORE-TEX GRAFT ARM;  Surgeon: Conrad Lambert, MD;  Location: [place];  Service: Vascular;  Laterality: Right;  . AV FISTULA PLACEMENT  Left 07/23/2016   Procedure: INSERTION OF ARTERIOVENOUS (AV) GORE-TEX GRAFT LEFT UPPER ARM;  Surgeon: Conrad Linden, MD;  Location: [place];  Service: Vascular;  Laterality: Left;  . AV FISTULA PLACEMENT Left 10/22/2016   Procedure: INSERTION OF 4-46m x 45cm  ARTERIOVENOUS (AV) GORE-TEX GRAFT THIGH-LEFT;  Surgeon: CConrad Iola MD;  [place]  Service: Vascular;  Laterality: Left;  . CATARACT EXTRACTION W/ INTRAOCULAR LENS  IMPLANT, BILATERAL    . COLONOSCOPY N/A 03/25/2017   Dr. FOneida Alar examined portion of ileum normal. Redundant left colon, stricture at anus   . ESOPHAGOGASTRODUODENOSCOPY N/A 03/25/2017   Dr. FOneida Alar widely patent Schatzki ring at GE junction s/p dilation, atrophic gastritis, single duodenal AVM, non-bleeding  . INSERTION OF DIALYSIS CATHETER Left 05/05/2012   Procedure: INSERTION OF DIALYSIS CATHETER;  Surgeon: CAngelia Mould MD;  [place]  Service: Vascular;  Laterality: Left;  . INSERTION OF DIALYSIS CATHETER Right 10/01/2016   Procedure: INSERTION OF DIALYSIS CATHETER- RIGHT FEMORAL;  Surgeon: CConrad Villisca MD;  [place] [facility]  Service: Vascular;  Laterality: Right;  . IR FLUORO GUIDE CV LINE RIGHT  06/10/2016  . IR GENERIC HISTORICAL  06/07/2016   IR [place] ACCESS RIGHT 06/07/2016 DArne Cleveland MD [facility]-INTERV RAD  . IR THROMBECTOMY AV FISTULA W/THROMBOLYSIS/PTA INC/SHUNT/IMG RIGHT Right 06/07/2016  . IR [place] VASC ACCESS RIGHT  06/10/2016  . LIGATION ARTERIOVENOUS GORTEX GRAFT Left 08/04/2016   Procedure: LIGATION ARTERIOVENOUS GORTEX GRAFT;  Surgeon: ERosetta Posner MD;  [place]  Service: Vascular;  Laterality: Left;  . LIGATION ARTERIOVENOUS GORTEX GRAFT Right 11/26/2016   Procedure: LIGATION OF RIGHT UPPER ARM ARTERIOVENOUS GORTEX GRAFT;  Surgeon: CConrad [place] MD;  [place]  Service: Vascular;  Laterality: Right;  . LIGATION OF ARTERIOVENOUS  FISTULA Right 08/25/2012   Procedure: LIGATION OF ARTERIOVENOUS  FISTULA;  Surgeon: BConrad Conway MD;  [place]  Service: Vascular;  Laterality: Right;  Ligation of right brachial vein transposition  . PERIPHERAL VASCULAR ATHERECTOMY  06/25/2017   Procedure: PERIPHERAL VASCULAR ATHERECTOMY;  Surgeon: CWaynetta Sandy MD;  [facility];  Service: Cardiovascular;;  Rt. PT  . REMOVAL OF A DIALYSIS CATHETER Right 05/05/2012   Procedure: REMOVAL OF A DIALYSIS CATHETER;  Surgeon: CAngelia Mould MD;  [place]  Service: Vascular;  Laterality: Right;  . REMOVAL OF A DIALYSIS CATHETER Right 10/01/2016   Procedure: REMOVAL OF A DIALYSIS CATHETER-RIGHT UPPER CHEST;  Surgeon: CConrad Jewell MD;  [place]  Service: Vascular;  Laterality: Right;  . REMOVAL OF A DIALYSIS CATHETER Right 11/26/2016   Procedure: REMOVAL OF RIGHT FEMORAL TUNNEL DIALYSIS CATHETER;  Surgeon: CConrad [place] Ridge MD;  [place]  Service: Vascular;  Laterality: Right;  . SPHINCTEROTOMY N/A 05/15/2017   Procedure: POSSIBLE SPHINCTEROTOMY (BOTOX);  Surgeon: TLeighton Ruff MD;  [facility]  Service: General;  Laterality: N/A;  . TOE AMPUTATION  02/2007   left foot first 3 toes  . UPPER EXTREMITY VENOGRAPHY Bilateral 07/18/2016   Procedure: Bilateral Upper Extremity Venography;  Surgeon: Conrad Red Butte, MD;  Location: [place] [facility];  Service: Cardiovascular;  Laterality: Bilateral;    Social History:  reports that he has never smoked. He has never used smokeless tobacco. He reports that he does not drink alcohol or use drugs.  Family History:  Family History  Problem Relation Age of Onset  . Diabetes Mother   . Hypertension Mother   . AAA (abdominal aortic aneurysm) Mother   . Diabetes Father   . Hypertension Father      Prior to Admission medications   Medication Sig Start Date End Date Taking? Authorizing Provider  calcium carbonate (TUMS - DOSED IN MG ELEMENTAL CALCIUM) 500 MG chewable tablet Chew 1 tablet by mouth daily as needed  for indigestion or heartburn.   Yes [provider]  carboxymethylcellulose (REFRESH PLUS) 0.5 % SOLN Place 1 drop into both eyes 3 (three) times daily as needed (dry eyes). AS DIRECTED    Yes [provider]  dicyclomine (BENTYL) 10 MG capsule 1 PO 30 MINUTES PRIOR TO BREAKFAST AND LUNCH Patient taking differently: Take 10 mg by mouth 2 (two) times daily.  03/25/17  Yes Fields, Sandi L, MD  insulin glargine (LANTUS) 100 UNIT/ML injection Inject 5 Units into the skin at bedtime.    Yes [provider]  losartan (COZAAR) 50 MG tablet Take 50 mg by mouth every evening.    Yes [provider]  multivitamin (RENA-VIT) TABS tablet Take 1 tablet by mouth daily.   Yes [provider]  pantoprazole (PROTONIX) 40 MG tablet 1 PO 30 MINUTES PRIOR TO MEALS BID FOR 3 MOS THEN QD Patient taking differently: Take 40 mg by mouth 2 (two) times daily.  03/25/17  Yes Fields, Marga Melnick, MD  Polysacch Fe Cmp-Fe Heme Poly (BIFERA) 28 MG TABS 1 po bid Patient taking differently: Take 28 mg by mouth 2 (two) times daily. 1 po bid 03/25/17  Yes Fields, Sandi L, MD  sevelamer carbonate (RENVELA) 800 MG tablet Take 1,600 mg by mouth 3 (three) times daily with meals. Supposed to take one 800 mg with snacks and caregiver said he only does that sometimes   Yes [provider]  SSD 1 % cream Apply 1 application topically daily. 05/22/17  Yes [provider]  traMADol (ULTRAM) 50 MG tablet Take 1 tablet (50 mg total) by mouth every 6 (six) hours as needed. 04/12/00  Yes Leighton Ruff, MD    Physical Exam: Vitals:   07/11/17 1745 07/11/17 1940 07/11/17 2111 07/11/17 2244  BP: (!) 169/51 (!) 124/54 (!) 154/65 138/69  Pulse: 81 74 83 82  Resp: '18 18 16 16  ' Temp: 98.7 F (37.1 C) 99.7 F (37.6 C) 99.1 F (37.3 C)   TempSrc: Oral Oral Oral   SpO2: 100% 100% 100% 98%  Weight: 59 kg (130 lb)     Height: '5\' 7"'  (1.702 m)      General: Not in acute distress HEENT:        Eyes: PERRL, EOMI, no scleral icterus.       ENT: No discharge from the ears and nose, no pharynx injection, no tonsillar enlargement.        Neck: No JVD, no bruit, no mass felt. Heme: No neck lymph node enlargement. Cardiac: S1/S2, RRR, No murmurs, No gallops or rubs. Respiratory: No rales, wheezing, rhonchi or rubs. GI: Soft, nondistended, nontender, no rebound pain, no organomegaly, BS present. GU: No hematuria Ext: No pitting leg edema bilaterally. Faint DP/PT  in left leg and pulse are difficult to be detected in right leg. First three toes missing in Left foot. S/p of AVF in right arm. Musculoskeletal: No joint deformities, No joint redness or warmth, no limitation of ROM in spin. Skin: No rashes.  Neuro: Alert, oriented X3, cranial nerves II-XII grossly intact, moves all extremities normally.  Psych: Patient is not psychotic, no suicidal or hemocidal ideation.  Labs on Admission: I have personally reviewed following labs and imaging studies  CBC: Recent Labs  Lab 07/11/17 1754  WBC 15.6*  NEUTROABS 13.6*  HGB 10.1*  HCT 33.0*  MCV 86.2  PLT 956   Basic Metabolic Panel: Recent Labs  Lab 07/11/17 1754  NA 133*  K 4.6  CL 92*  CO2 27  GLUCOSE 387*  BUN 29*  CREATININE 5.43*  CALCIUM 7.5*   GFR: Estimated Creatinine Clearance: 9.2 mL/min (A) (by C-G formula based on SCr of 5.43 mg/dL (H)). Liver Function Tests: Recent Labs  Lab 07/11/17 1754  AST 26  ALT 24  ALKPHOS 160*  BILITOT 0.5  PROT 7.1  ALBUMIN 2.3*   No results for input(s): LIPASE, AMYLASE in the last 168 hours. No results for input(s): AMMONIA in the last 168 hours. Coagulation Profile: No results for input(s): INR, PROTIME in the last 168 hours. Cardiac Enzymes: No results for input(s): CKTOTAL, CKMB, CKMBINDEX, TROPONINI in the last 168 hours. BNP (last 3 results) No results for input(s): PROBNP in the last 8760 hours. HbA1C: No results for input(s): HGBA1C in the last 72 hours. CBG: No  results for input(s): GLUCAP in the last 168 hours. Lipid Profile: No results for input(s): CHOL, HDL, LDLCALC, TRIG, CHOLHDL, LDLDIRECT in the last 72 hours. Thyroid Function Tests: No results for input(s): TSH, T4TOTAL, FREET4, T3FREE, THYROIDAB in the last 72 hours. Anemia Panel: No results for input(s): VITAMINB12, FOLATE, FERRITIN, TIBC, IRON, RETICCTPCT in the last 72 hours. Urine analysis:    Component Value Date/Time   COLORURINE YELLOW 12/23/2016 2200   APPEARANCEUR CLEAR 12/23/2016 2200   LABSPEC 1.008 12/23/2016 2200   PHURINE 7.0 12/23/2016 2200   GLUCOSEU >=500 (A) 12/23/2016 2200   HGBUR NEGATIVE 12/23/2016 2200   BILIRUBINUR NEGATIVE 12/23/2016 2200   KETONESUR NEGATIVE 12/23/2016 2200   PROTEINUR 100 (A) 12/23/2016 2200   NITRITE NEGATIVE 12/23/2016 2200   LEUKOCYTESUR NEGATIVE 12/23/2016 2200   Sepsis Labs: '@LABRCNTIP' (procalcitonin:4,lacticidven:4) )No results found for this or any previous visit (from the past 240 hour(s)).   Radiological Exams on Admission: No results found.   EKG:  Not done in ED, will get one.   Assessment/Plan Principal Problem:   Diabetic foot infection ([place]) Active Problems:   Essential hypertension   GERD   ESRD (end stage renal disease) on dialysis ([facility])   CAD (coronary artery disease)   Type II diabetes mellitus with renal manifestations ([facility])   Wound of right foot   Diabetic foot wound infection: Patient has a leukocytosis, but no fever, does not meet criteria for sepsis.  Lactic acid is normal.  Hemodynamically stable. - will admit to tele bed as inpt. Per EDP, Dr. Donzetta Matters is planning on a below the knee amputation next week.  - Empiric antimicrobial treatment with vancomycin  - PRN Zofran for nausea, tramadol and for pain - Blood cultures x 2  - ESR and CRP - wound care consult - will get Procalcitonin and trend lactic acid levels per sepsis protocol.  HTN:  -Continue home medications: Losartan -IV hydralazine  prn  GERD: -  Protonix  Hx of CAD (coronary artery disease): no CP. Pt is not taking medication for CAD except for blood pressure medications losartan  -Observe now.   Type II diabetes mellitus with ERSD manifestations: Last A1c 7.1, fairly controled. Patient is taking  lantus at home. CBG 387. -will continue home dose lantus 5 U daily -SSI  ESRD (end stage renal disease) on dialysis (MWF): potassium 4.6, bicarbonate 27, creatinine 5.43, BUN 29.  Patient has been compliant to dialysis. -Please call renal for dialysis  DVT ppx: SQ Heparin     Code Status: Full code Family Communication:  Yes, patient's brother and friend   at bed side Disposition Plan:  Anticipate discharge back to previous home environment Consults called:  none Admission status: inpatient/tele     Date of Service 07/12/2017    Ivor Costa [facility] Pager [phone]  If 7PM-7AM, please contact night-coverage [email or web address] Password [email or web address] 07/12/2017, 2:53 AM

## 2017-07-12 NOTE — ED Provider Notes (Signed)
[facility] EMERGENCY DEPARTMENT Provider Note   CSN: [id number] Arrival date & time: 07/11/17  1741     History   Chief Complaint Chief Complaint  Patient presents with  . foot infection    right    HPI Charles Vasquez is a 82 y.o. male.  The history is provided by the patient.  Leg Pain   This is a new problem. The current episode started more than 1 week ago. The problem occurs daily. The problem has been gradually worsening. The pain is present in the right foot. The quality of the pain is described as aching. The pain is moderate. He has tried nothing for the symptoms.  Patient with extensive medical conditions including end-stage renal disease, peripheral vascular disease, previous stroke and MI presents with right foot and leg pain for over a month.  It is worsening.  No recent traumas.  He reports foul-smelling wound on her right foot. Seen by vascular surgery and sent for admission  Past Medical History:  Diagnosis Date  . Anal pain   . AVF (arteriovenous fistula) (HCC) 05-13-2017 per pt currently AVF access used is left thigh   hx multiple AVF surgery's (previously left upper arm, bilateral thigh) last surgery -- right upper arm creation 11-26-2016  . ESRD (end stage renal disease) on dialysis [facility])    [facility] in [place], [place] on MWF   . History of CVA (cerebrovascular accident)    05-13-2017  per pt stated "thats what they told yrs ago"  per pt no residual  . Hyperlipidemia   . Hypertension   . Myocardial infarction (HCC)   . Stroke (HCC)   . Type 2 diabetes mellitus treated with insulin [facility])     Patient Active Problem List   Diagnosis Date Noted  . Diabetic foot infection (HCC) 07/12/2017  . CAD (coronary artery disease) 07/12/2017  . Type II diabetes mellitus with renal manifestations (HCC) 07/12/2017  . Loose stools 05/20/2017  . Abdominal pain   . Loss of weight 03/18/2017  . Abnormal CT scan, colon 03/18/2017  . Melena  03/18/2017  . Periumbilical abdominal pain 03/18/2017  . ESRD (end stage renal disease) on dialysis (HCC) 10/22/2016  . Venous hypertension status post AVG procedure 08/10/2016  . Anasarca 08/04/2016  . Facial swelling 08/04/2016  . Effusion, other site 08/04/2016  . 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 Acute respiratory failure (HCC) 08/04/2016  . Hyperkalemia 08/04/2016  . Aftercare following surgery of the circulatory system, NEC-Right  AVGG 09/25/2012  . Other complications due to renal dialysis device, implant, and graft 05/01/2012  . End stage renal disease (HCC) 03/17/2012  . FATIGUE, ACUTE 08/22/2008  . HEMATURIA UNSPECIFIED 07/11/2008  . Diabetes (HCC) 01/19/2008  . Essential hypertension 01/19/2008  . MYOCARDIAL INFARCTION, HX OF 01/19/2008  . GERD 01/19/2008  . OSTEOARTHRITIS 01/19/2008  . CEREBROVASCULAR ACCIDENT, HX OF 01/19/2008    Past Surgical History:  Procedure Laterality Date  . ABDOMINAL AORTOGRAM W/LOWER EXTREMITY N/A 06/25/2017   Procedure: ABDOMINAL AORTOGRAM W/LOWER EXTREMITY;  Surgeon: Maeola Harman, MD;  Location: [facility] INVASIVE CV LAB;  Service: Cardiovascular;  Laterality: N/A;  . AMPUTATION Right 01/01/2017   Procedure: REVISION AMPUTATION RIGHT INDEX FINGER;  Surgeon: Dairl Ponder, MD;  Location: [facility] OR;  Service: Orthopedics;  Laterality: Right;  . ARTERIOVENOUS GRAFT PLACEMENT    . ARTERIOVENOUS GRAFT PLACEMENT Left 10/22/2016   thigh  . AV FISTULA PLACEMENT  03/31/2012   Procedure: ARTERIOVENOUS (AV) FISTULA CREATION;  Surgeon: Arlys John  Rolena Infante, MD;  Location: [facility] OR;  Service: Vascular;  Laterality: Right;  First stage Brachial vein transposition  . AV FISTULA PLACEMENT Right 08/25/2012   Procedure: INSERTION OF ARTERIOVENOUS (AV) GORE-TEX GRAFT ARM;  Surgeon: [name], MD;  Location: [facility] OR;  Service: Vascular;  Laterality: Right;  . AV FISTULA PLACEMENT Left 07/23/2016   Procedure: INSERTION OF ARTERIOVENOUS (AV) GORE-TEX GRAFT LEFT UPPER ARM;   Surgeon: [name], MD;  Location: [facility] OR;  Service: Vascular;  Laterality: Left;  . AV FISTULA PLACEMENT Left 10/22/2016   Procedure: INSERTION OF 4-69mm x 45cm  ARTERIOVENOUS (AV) GORE-TEX GRAFT THIGH-LEFT;  Surgeon: [name], MD;  Location: [facility] OR;  Service: Vascular;  Laterality: Left;  . CATARACT EXTRACTION W/ INTRAOCULAR LENS  IMPLANT, BILATERAL    . COLONOSCOPY N/A 03/25/2017   Dr. Darrick Penna: examined portion of ileum normal. Redundant left colon, stricture at anus   . ESOPHAGOGASTRODUODENOSCOPY N/A 03/25/2017   Dr. Darrick Penna: widely patent Schatzki ring at GE junction s/p dilation, atrophic gastritis, single duodenal AVM, non-bleeding  . INSERTION OF DIALYSIS CATHETER Left 05/05/2012   Procedure: INSERTION OF DIALYSIS CATHETER;  Surgeon: Chuck Hint, MD;  Location: [facility] OR;  Service: Vascular;  Laterality: Left;  . INSERTION OF DIALYSIS CATHETER Right 10/01/2016   Procedure: INSERTION OF DIALYSIS CATHETER- RIGHT FEMORAL;  Surgeon: [name], MD;  Location: [facility] OR;  Service: Vascular;  Laterality: Right;  . IR FLUORO GUIDE CV LINE RIGHT  06/10/2016  . IR GENERIC HISTORICAL  06/07/2016   IR US GUIDE VASC ACCESS RIGHT 06/07/2016 Oley Balm, MD [facility]-INTERV RAD  . IR THROMBECTOMY AV FISTULA W/THROMBOLYSIS/PTA INC/SHUNT/IMG RIGHT Right 06/07/2016  . IR US GUIDE VASC ACCESS RIGHT  06/10/2016  . LIGATION ARTERIOVENOUS GORTEX GRAFT Left 08/04/2016   Procedure: LIGATION ARTERIOVENOUS GORTEX GRAFT;  Surgeon: Larina Earthly, MD;  Location: [facility] OR;  Service: Vascular;  Laterality: Left;  . LIGATION ARTERIOVENOUS GORTEX GRAFT Right 11/26/2016   Procedure: LIGATION OF RIGHT UPPER ARM ARTERIOVENOUS GORTEX GRAFT;  Surgeon: [name], MD;  Location: [facility] OR;  Service: Vascular;  Laterality: Right;  . LIGATION OF ARTERIOVENOUS  FISTULA Right 08/25/2012   Procedure: LIGATION OF ARTERIOVENOUS  FISTULA;  Surgeon: [name], MD;  Location: [facility] OR;  Service: Vascular;  Laterality: Right;  Ligation of right  brachial vein transposition  . PERIPHERAL VASCULAR ATHERECTOMY  06/25/2017   Procedure: PERIPHERAL VASCULAR ATHERECTOMY;  Surgeon: Maeola Harman, MD;  Location: [facility] INVASIVE CV LAB;  Service: Cardiovascular;;  Rt. PT  . REMOVAL OF A DIALYSIS CATHETER Right 05/05/2012   Procedure: REMOVAL OF A DIALYSIS CATHETER;  Surgeon: Chuck Hint, MD;  Location: [facility] OR;  Service: Vascular;  Laterality: Right;  . REMOVAL OF A DIALYSIS CATHETER Right 10/01/2016   Procedure: REMOVAL OF A DIALYSIS CATHETER-RIGHT UPPER CHEST;  Surgeon: [name], MD;  Location: [facility] OR;  Service: Vascular;  Laterality: Right;  . REMOVAL OF A DIALYSIS CATHETER Right 11/26/2016   Procedure: REMOVAL OF RIGHT FEMORAL TUNNEL DIALYSIS CATHETER;  Surgeon: [name], MD;  Location: [facility] OR;  Service: Vascular;  Laterality: Right;  . SPHINCTEROTOMY N/A 05/15/2017   Procedure: POSSIBLE SPHINCTEROTOMY (BOTOX);  Surgeon: Romie Levee, MD;  Location: [facility];  Service: General;  Laterality: N/A;  . TOE AMPUTATION  02/2007   left foot first 3 toes  . UPPER EXTREMITY VENOGRAPHY Bilateral 07/18/2016   Procedure: Bilateral Upper Extremity Venography;  Surgeon: [name], MD;  Location: [facility] INVASIVE CV LAB;  Service: Cardiovascular;  Laterality: Bilateral;        Home Medications    Prior to Admission medications   Medication Sig Start Date End Date Taking? Authorizing Provider  calcium carbonate (TUMS - DOSED IN MG ELEMENTAL CALCIUM) 500 MG chewable tablet Chew 1 tablet by mouth daily as needed for indigestion or heartburn.   Yes [provider]  carboxymethylcellulose (REFRESH PLUS) 0.5 % SOLN Place 1 drop into both eyes 3 (three) times daily as needed (dry eyes). AS DIRECTED    Yes [provider]  dicyclomine (BENTYL) 10 MG capsule 1 PO 30 MINUTES PRIOR TO BREAKFAST AND LUNCH Patient taking differently: Take 10 mg by mouth 2 (two) times daily.  03/25/17  Yes Fields, Sandi L, MD    insulin glargine (LANTUS) 100 UNIT/ML injection Inject 5 Units into the skin at bedtime.    Yes [provider]  losartan (COZAAR) 50 MG tablet Take 50 mg by mouth every evening.    Yes [provider]  multivitamin (RENA-VIT) TABS tablet Take 1 tablet by mouth daily.   Yes [provider]  pantoprazole (PROTONIX) 40 MG tablet 1 PO 30 MINUTES PRIOR TO MEALS BID FOR 3 MOS THEN QD Patient taking differently: Take 40 mg by mouth 2 (two) times daily.  03/25/17  Yes Fields, Darleene Cleaver, MD  Polysacch Fe Cmp-Fe Heme Poly (BIFERA) 28 MG TABS 1 po bid Patient taking differently: Take 28 mg by mouth 2 (two) times daily. 1 po bid 03/25/17  Yes Fields, Sandi L, MD  sevelamer carbonate (RENVELA) 800 MG tablet Take 1,600 mg by mouth 3 (three) times daily with meals. Supposed to take one 800 mg with snacks and caregiver said he only does that sometimes   Yes [provider]  SSD 1 % cream Apply 1 application topically daily. 05/22/17  Yes [provider]  traMADol (ULTRAM) 50 MG tablet Take 1 tablet (50 mg total) by mouth every 6 (six) hours as needed. 05/15/17  Yes Romie Levee, MD    Family History Family History  Problem Relation Age of Onset  . Diabetes Mother   . Hypertension Mother   . AAA (abdominal aortic aneurysm) Mother   . Diabetes Father   . Hypertension Father     Social History Social History   Tobacco Use  . Smoking status: Never Smoker  . Smokeless tobacco: Never Used  Substance Use Topics  . Alcohol use: No  . Drug use: No     Allergies   Lisinopril   Review of Systems Review of Systems  Constitutional: Negative for fever.  Cardiovascular: Negative for chest pain.  Gastrointestinal: Negative for abdominal pain.  Skin: Positive for wound.  Neurological: Negative for headaches.  All other systems reviewed and are negative.    Physical Exam Updated Vital Signs BP 138/69 (BP Location: Right Arm)   Pulse 82   Temp 99.1 F  (37.3 C) (Oral)   Resp 16   Ht 1.702 m (5\' 7" )   Wt 59 kg (130 lb)   SpO2 98%   BMI 20.36 kg/m   Physical Exam CONSTITUTIONAL: Chronically ill-appearing HEAD: Normocephalic/atraumatic EYES: EOMI ENMT: Mucous membranes moist NECK: supple no meningeal signs SPINE/BACK:entire spine nontender CV: S1/S2 noted LUNGS: Lungs are clear to auscultation bilaterally, no apparent distress ABDOMEN: soft, nontender NEURO: Pt is awake/alert/appropriate, moves all extremitiesx4.   EXTREMITIES: Foul-smelling wound to right heel.  Erythema noted to right lower tibial surface.  No crepitus.  See photo SKIN: warm, color normal PSYCH: no abnormalities of mood noted, alert and oriented to situation    Patient gave verbal permission to utilize photo for medical documentation only The image was not stored on any personal device  ED Treatments / Results  Labs (all labs ordered are listed, but only abnormal results are displayed) Labs Reviewed  COMPREHENSIVE METABOLIC PANEL - Abnormal; Notable for the following components:      Result Value   Sodium 133 (*)    Chloride 92 (*)    Glucose, Bld 387 (*)    BUN 29 (*)    Creatinine, Ser 5.43 (*)    Calcium 7.5 (*)    Albumin 2.3 (*)    Alkaline Phosphatase 160 (*)    GFR calc non Af Amer 9 (*)    GFR calc Af Amer 10 (*)    All other components within normal limits  CBC WITH DIFFERENTIAL/PLATELET - Abnormal; Notable for the following components:   WBC 15.6 (*)    RBC 3.83 (*)    Hemoglobin 10.1 (*)    HCT 33.0 (*)    RDW 16.5 (*)    Neutro Abs 13.6 (*)    Lymphs Abs 0.6 (*)    Monocytes Absolute 1.3 (*)    All other components within normal limits  I-STAT CG4 LACTIC ACID, ED    EKG None  Radiology No results found.  Procedures Procedures   Medications Ordered in ED Medications  fentaNYL (SUBLIMAZE) injection 50 mcg (has no administration in time range)  vancomycin (VANCOCIN) 1,250 mg in sodium chloride 0.9 % 250 mL IVPB (has no  administration in time range)  vancomycin (VANCOCIN) 500 mg in sodium chloride 0.9 % 100 mL IVPB (has no administration in time range)     Initial Impression / Assessment and Plan / ED Course  I have reviewed the triage vital signs and the nursing notes.  Pertinent labsresults that were available during my care of the patient were reviewed by me and considered in my medical decision making (see chart for details).     Per records, patient saw Dr. Pascal Lux with vascular surgery on May 3.  He was sent for admission, pain control, antibiotics.  They are planning on a below the knee amputation next week.  Discussed with Dr. Clyde Lundborg for admission  Final Clinical Impressions(s) / ED Diagnoses   Final diagnoses:  Wound of right foot  Cellulitis of right foot  ESRD (end stage renal disease) [facility])    ED Discharge Orders    None       Zadie Rhine, MD 07/12/17 0157

## 2017-07-12 NOTE — Progress Notes (Signed)
3 old male with a history of end-stage renal disease, dialysis Monday Wednesday Friday, admitted for right BKA early next week.  Follow blood culture.  Continue vancomycin.  Dr. Randie Heinz on consult.

## 2017-07-12 NOTE — Progress Notes (Signed)
PHARMACY - PHYSICIAN COMMUNICATION CRITICAL VALUE ALERT - BLOOD CULTURE IDENTIFICATION (BCID)  Charles Vasquez is an 82 y.o. male who presented to [facility] on 07/11/2017 with a chief complaint of diabetic wound infection pending amputation  Name of physician (or Provider) Contacted: Dr Ashley Royalty  Current antibiotics: Zosyn, Vanc  Changes to prescribed antibiotics recommended:  DC Vanc Continue zosyn - possible anaerobe as well  Results for orders placed or performed during the hospital encounter of 07/11/17  Blood Culture ID Panel (Reflexed) (Collected: 07/12/2017  3:47 AM)  Result Value Ref Range   Enterococcus species NOT DETECTED NOT DETECTED   Listeria monocytogenes NOT DETECTED NOT DETECTED   Staphylococcus species NOT DETECTED NOT DETECTED   Staphylococcus aureus NOT DETECTED NOT DETECTED   Streptococcus species DETECTED (A) NOT DETECTED   Streptococcus agalactiae NOT DETECTED NOT DETECTED   Streptococcus pneumoniae NOT DETECTED NOT DETECTED   Streptococcus pyogenes NOT DETECTED NOT DETECTED   Acinetobacter baumannii NOT DETECTED NOT DETECTED   Enterobacteriaceae species DETECTED (A) NOT DETECTED   Enterobacter cloacae complex NOT DETECTED NOT DETECTED   Escherichia coli NOT DETECTED NOT DETECTED   Klebsiella oxytoca NOT DETECTED NOT DETECTED   Klebsiella pneumoniae NOT DETECTED NOT DETECTED   Proteus species DETECTED (A) NOT DETECTED   Serratia marcescens NOT DETECTED NOT DETECTED   Carbapenem resistance NOT DETECTED NOT DETECTED   Haemophilus influenzae NOT DETECTED NOT DETECTED   Neisseria meningitidis NOT DETECTED NOT DETECTED   Pseudomonas aeruginosa NOT DETECTED NOT DETECTED   Candida albicans NOT DETECTED NOT DETECTED   Candida glabrata NOT DETECTED NOT DETECTED   Candida krusei NOT DETECTED NOT DETECTED   Candida parapsilosis NOT DETECTED NOT DETECTED   Candida tropicalis NOT DETECTED NOT DETECTED   Isaac Bliss, PharmD, BCPS, BCCCP Clinical  Pharmacist Clinical phone for 07/12/2017 from 1430 - 2300: [phone] If after 2300, please call main pharmacy at: [phone] 07/12/2017 4:18 PM

## 2017-07-12 NOTE — Progress Notes (Signed)
Hydralazine 5mg  PRN given as ordered

## 2017-07-12 NOTE — ED Notes (Signed)
EMERGENCY DEPARTMENT  US GUIDANCE EXAM Emergency Ultrasound:  US Guidance for Needle Guidance  INDICATIONS: Difficult vascular access Linear probe used in real-time to visualize location of needle entry through skin.   PERFORMED BY: Myself IMAGES ARCHIVED?: No LIMITATIONS: None VIEWS USED: Transverse INTERPRETATION: Needle visualized within vein   Tolerated well w/no complications.   Arlys John RN  3:35 AM 07/12/17

## 2017-07-12 NOTE — Progress Notes (Addendum)
CRITICAL VALUE ALERT  Critical Value:  22, second reading 46  Date & Time Notied:  07/12/17 1215  Provider Notified: Dr. Jerolyn Center, 1220  Orders Received/Actions taken: MD made aware.  Pt eating lunch and had 15 g CHO (orange juice), CBG 75 at 1222.  Pt was aymptommatic at time of hypoglycemic event.  CBG rechecked after pt finished eating meal and was 127.

## 2017-07-13 DIAGNOSIS — L8990 Pressure ulcer of unspecified site, unspecified stage: Secondary | ICD-10-CM

## 2017-07-13 LAB — GLUCOSE, CAPILLARY
GLUCOSE-CAPILLARY: 241 mg/dL — AB (ref 65–99)
GLUCOSE-CAPILLARY: 260 mg/dL — AB (ref 65–99)
GLUCOSE-CAPILLARY: 323 mg/dL — AB (ref 65–99)
Glucose-Capillary: 311 mg/dL — ABNORMAL HIGH (ref 65–99)

## 2017-07-13 LAB — CBC WITH DIFFERENTIAL/PLATELET
BASOS ABS: 0 10*3/uL (ref 0.0–0.1)
BASOS PCT: 0 %
Eosinophils Absolute: 0.1 10*3/uL (ref 0.0–0.7)
Eosinophils Relative: 1 %
HEMATOCRIT: 31.4 % — AB (ref 39.0–52.0)
HEMOGLOBIN: 9.7 g/dL — AB (ref 13.0–17.0)
LYMPHS PCT: 4 %
Lymphs Abs: 0.8 10*3/uL (ref 0.7–4.0)
MCH: 26.6 pg (ref 26.0–34.0)
MCHC: 30.9 g/dL (ref 30.0–36.0)
MCV: 86 fL (ref 78.0–100.0)
MONO ABS: 0.9 10*3/uL (ref 0.1–1.0)
Monocytes Relative: 5 %
NEUTROS ABS: 16.7 10*3/uL — AB (ref 1.7–7.7)
NEUTROS PCT: 90 %
Platelets: 222 10*3/uL (ref 150–400)
RBC: 3.65 MIL/uL — ABNORMAL LOW (ref 4.22–5.81)
RDW: 16.3 % — ABNORMAL HIGH (ref 11.5–15.5)
WBC: 18.5 10*3/uL — ABNORMAL HIGH (ref 4.0–10.5)

## 2017-07-13 LAB — BASIC METABOLIC PANEL
ANION GAP: 15 (ref 5–15)
BUN: 50 mg/dL — ABNORMAL HIGH (ref 6–20)
CALCIUM: 7.5 mg/dL — AB (ref 8.9–10.3)
CO2: 25 mmol/L (ref 22–32)
Chloride: 94 mmol/L — ABNORMAL LOW (ref 101–111)
Creatinine, Ser: 8.23 mg/dL — ABNORMAL HIGH (ref 0.61–1.24)
GFR calc non Af Amer: 5 mL/min — ABNORMAL LOW (ref >60)
GFR, EST AFRICAN AMERICAN: 6 mL/min — AB (ref >60)
GLUCOSE: 248 mg/dL — AB (ref 65–99)
Potassium: 5 mmol/L (ref 3.5–5.1)
Sodium: 134 mmol/L — ABNORMAL LOW (ref 135–145)

## 2017-07-13 MED ORDER — INSULIN GLARGINE 100 UNIT/ML ~~LOC~~ SOLN
5.0000 [IU] | Freq: Every day | SUBCUTANEOUS | Status: DC
  Administered 2017-07-13: 5 [IU] via SUBCUTANEOUS
  Filled 2017-07-13: qty 0.05

## 2017-07-13 MED ORDER — INSULIN ASPART 100 UNIT/ML ~~LOC~~ SOLN
5.0000 [IU] | Freq: Once | SUBCUTANEOUS | Status: AC
  Administered 2017-07-13: 5 [IU] via SUBCUTANEOUS

## 2017-07-13 NOTE — Progress Notes (Signed)
PROGRESS NOTE    Charles Vasquez  ZOX:[id number] DOB: 11/08/1935 DOA: 07/11/2017 PCP: Toma Deiters, MD Brief Narrative: 82 y.o. male with medical history significant of hypertension, hyperlipidemia, diabetes mellitus, stroke, GERD, ESRD-HD (MWF), CAD, PVD, who presents with right foot wound infection.  Pt recently underwent right lower extremity angiogram with attempted revascularization of his right posterior tibial artery that was unsuccessful 5 weeks ago. Pt has a right foot heel wound infection, with serosangious drainage and foul smelling odor. Pt was seen by [facility], Dr. Randie Heinz today. Dressing was changed at office. Pt was told that he might have to have an amputation. Pt was sent to ED for admission for antibiotics and pain control.  Patient states that he has some moderate of pain, but no fever or chills.  He denies chest pain, shortness of breath, cough, nausea, vomiting, diarrhea or abdominal pain no symptoms of UTI or unilateral weakness.  Patient states that he had a dialysis on Friday.  ED Course: pt was found to have WBC 15.6, lactic acid of 1.24, potassium 4.6, bicarbonate 27, creatinine 5.43, BUN 29, temperature 99.8, no tachycardia, no tachypnea, oxygen saturation 98% on room air.  Patient is admitted to telemetry bed as inpatient.    Assessment & Plan:   Principal Problem:   Diabetic foot infection (HCC) Active Problems:   Essential hypertension   GERD   ESRD (end stage renal disease) on dialysis (HCC)   CAD (coronary artery disease)   Type II diabetes mellitus with renal manifestations (HCC)   Wound of right foot   Pressure injury of skin  1] diabetic foot wound infection-patient to have amputation early next week.  Comycin was stopped Zosyn being continued.  Blood cultures grew Streptococcus, Enterobacteriaceae, and Proteus species.  2] type 2 diabetes patient had episodes of hypoglycemia yesterday from decreased eating and decreased appetite.  Long-acting insulin was on  hold.  However his appetite is picked up today sugars climbing up again will restart insulin long-acting at a lower dose and adjust the dose as needed.  3] hypertension stable on losartan.  4] ESRD dialysis Monday Wednesday Friday.   DVT prophylaxis: Subcu heparin Code Status: Full code Family Communication no family family available today at bedside. Disposition Plan: TBD Consultants:  Dr. Pascal Lux from vascular surgery, and nephrology. Procedures: None antimicrobials: Zosyn  Subjective: He reports that he is hungry and he wants to eat.  Objective: He is much more awake and alert compared to yesterday.  His appetite is getting better. Vitals:   07/12/17 1931 07/12/17 2255 07/12/17 2329 07/13/17 0436  BP: (!) 174/67 (!) 181/71 (!) 157/61 (!) 142/59  Pulse: 99 75 85 83  Resp: 12 19 (!) 23 17  Temp: (!) 97.1 F (36.2 C)   98.8 F (37.1 C)  TempSrc: Oral   Oral  SpO2: 98% 100% 100% 100%  Weight:    60.1 kg (132 lb 7.9 oz)  Height:        Intake/Output Summary (Last 24 hours) at 07/13/2017 0936 Last data filed at 07/13/2017 0847 Gross per 24 hour  Intake 770 ml  Output -  Net 770 ml   Filed Weights   07/11/17 1745 07/12/17 0445 07/13/17 0436  Weight: 59 kg (130 lb) 60 kg (132 lb 4.4 oz) 60.1 kg (132 lb 7.9 oz)    Examination:  General exam: Appears calm and comfortable , more awake and alert today. Respiratory system: Clear to auscultation. Respiratory effort normal. Cardiovascular system: S1 & S2 heard, RRR.  No JVD, murmurs, rubs, gallops or clicks. No pedal edema. Gastrointestinal system: Abdomen is nondistended, soft and nontender. No organomegaly or masses felt. Normal bowel sounds heard. Central nervous system: Alert and oriented. No focal neurological deficits. Extremities: Right foot dressings on. Skin: No rashes, lesions or ulcers Psychiatry: Judgement and insight appear normal. Mood & affect appropriate.     Data Reviewed: I have personally reviewed following  labs and imaging studies  CBC: Recent Labs  Lab 07/11/17 1754 07/13/17 0248  WBC 15.6* 18.5*  NEUTROABS 13.6* 16.7*  HGB 10.1* 9.7*  HCT 33.0* 31.4*  MCV 86.2 86.0  PLT 202 222   Basic Metabolic Panel: Recent Labs  Lab 07/11/17 1754 07/13/17 0248  NA 133* 134*  K 4.6 5.0  CL 92* 94*  CO2 27 25  GLUCOSE 387* 248*  BUN 29* 50*  CREATININE 5.43* 8.23*  CALCIUM 7.5* 7.5*   GFR: Estimated Creatinine Clearance: 6.2 mL/min (A) (by C-G formula based on SCr of 8.23 mg/dL (H)). Liver Function Tests: Recent Labs  Lab 07/11/17 1754  AST 26  ALT 24  ALKPHOS 160*  BILITOT 0.5  PROT 7.1  ALBUMIN 2.3*   No results for input(s): LIPASE, AMYLASE in the last 168 hours. No results for input(s): AMMONIA in the last 168 hours. Coagulation Profile: Recent Labs  Lab 07/12/17 0213  INR 1.12   Cardiac Enzymes: No results for input(s): CKTOTAL, CKMB, CKMBINDEX, TROPONINI in the last 168 hours. BNP (last 3 results) No results for input(s): PROBNP in the last 8760 hours. HbA1C: No results for input(s): HGBA1C in the last 72 hours. CBG: Recent Labs  Lab 07/12/17 1222 07/12/17 1254 07/12/17 1612 07/12/17 2154 07/13/17 0623  GLUCAP 75 127* 190* 249* 241*   Lipid Profile: No results for input(s): CHOL, HDL, LDLCALC, TRIG, CHOLHDL, LDLDIRECT in the last 72 hours. Thyroid Function Tests: No results for input(s): TSH, T4TOTAL, FREET4, T3FREE, THYROIDAB in the last 72 hours. Anemia Panel: No results for input(s): VITAMINB12, FOLATE, FERRITIN, TIBC, IRON, RETICCTPCT in the last 72 hours. Sepsis Labs: Recent Labs  Lab 07/11/17 1813 07/12/17 0213 07/12/17 0215  PROCALCITON  --  21.59  --   LATICACIDVEN 1.24  --  1.3    Recent Results (from the past 240 hour(s))  Culture, blood (routine x 2)     Status: None (Preliminary result)   Collection Time: 07/12/17  3:47 AM  Result Value Ref Range Status   Specimen Description BLOOD LEFT ANTECUBITAL  Final   Special Requests    Final    BOTTLES DRAWN AEROBIC AND ANAEROBIC Blood Culture adequate volume   Culture  Setup Time   Final    GRAM POSITIVE RODS ANAEROBIC BOTTLE ONLY CRITICAL RESULT CALLED TO, READ BACK BY AND VERIFIED WITH: M MACCIA,PHARMD AT 1614 07/12/17 BY L BENFIELD GRAM NEGATIVE RODS GRAM POSITIVE COCCI IN CHAINS IN BOTH AEROBIC AND ANAEROBIC BOTTLES Performed at [facility], [address] [place], [place] [address]    Culture   Final    Romie Minus POSITIVE RODS Romie Minus POSITIVE COCCI GRAM NEGATIVE RODS    Report Status PENDING  Incomplete  Blood Culture ID Panel (Reflexed)     Status: Abnormal   Collection Time: 07/12/17  3:47 AM  Result Value Ref Range Status   Enterococcus species NOT DETECTED NOT DETECTED Final   Listeria monocytogenes NOT DETECTED NOT DETECTED Final   Staphylococcus species NOT DETECTED NOT DETECTED Final   Staphylococcus aureus NOT DETECTED NOT DETECTED Final   Streptococcus  species DETECTED (A) NOT DETECTED Final    Comment: Not Enterococcus species, Streptococcus agalactiae, Streptococcus pyogenes, or Streptococcus pneumoniae. CRITICAL RESULT CALLED TO, READ BACK BY AND VERIFIED WITH: M MACCIA,PHARMD AT 1614 07/12/17 BY L BENFIELD    Streptococcus agalactiae NOT DETECTED NOT DETECTED Final   Streptococcus pneumoniae NOT DETECTED NOT DETECTED Final   Streptococcus pyogenes NOT DETECTED NOT DETECTED Final   Acinetobacter baumannii NOT DETECTED NOT DETECTED Final   Enterobacteriaceae species DETECTED (A) NOT DETECTED Final    Comment: Enterobacteriaceae represent a large family of gram-negative bacteria, not a single organism. CRITICAL RESULT CALLED TO, READ BACK BY AND VERIFIED WITH: M MACCIA,PHARMD AT 1614 07/12/17 BY L BENFIELD    Enterobacter cloacae complex NOT DETECTED NOT DETECTED Final   Escherichia coli NOT DETECTED NOT DETECTED Final   Klebsiella oxytoca NOT DETECTED NOT DETECTED Final   Klebsiella pneumoniae NOT DETECTED NOT DETECTED Final   Proteus species  DETECTED (A) NOT DETECTED Final    Comment: CRITICAL RESULT CALLED TO, READ BACK BY AND VERIFIED WITH: M MACCIA,PHARMD AT 1614 07/12/17 BY L BENFIELD    Serratia marcescens NOT DETECTED NOT DETECTED Final   Carbapenem resistance NOT DETECTED NOT DETECTED Final   Haemophilus influenzae NOT DETECTED NOT DETECTED Final   Neisseria meningitidis NOT DETECTED NOT DETECTED Final   Pseudomonas aeruginosa NOT DETECTED NOT DETECTED Final   Candida albicans NOT DETECTED NOT DETECTED Final   Candida glabrata NOT DETECTED NOT DETECTED Final   Candida krusei NOT DETECTED NOT DETECTED Final   Candida parapsilosis NOT DETECTED NOT DETECTED Final   Candida tropicalis NOT DETECTED NOT DETECTED Final    Comment: Performed at [facility], [address], [place], [place] [address]  MRSA PCR Screening     Status: None   Collection Time: 07/12/17  5:26 AM  Result Value Ref Range Status   MRSA by PCR NEGATIVE NEGATIVE Final    Comment:        The GeneXpert MRSA Assay (FDA approved for NASAL specimens only), is one component of a comprehensive MRSA colonization surveillance program. It is not intended to diagnose MRSA infection nor to guide or monitor treatment for MRSA infections. Performed at [facility], [address] [place], [place] [address]          Radiology Studies: No results found.      Scheduled Meds: . dicyclomine  10 mg Oral BID WC  . heparin  5,000 Units Subcutaneous Q8H  . insulin aspart  0-9 Units Subcutaneous TID WC  . iron polysaccharides  150 mg Oral BID  . losartan  50 mg Oral QPM  . multivitamin  1 tablet Oral Daily  . pantoprazole  40 mg Oral BID WC  . sevelamer carbonate  1,600 mg Oral TID WC  . silver sulfADIAZINE  1 application Topical Daily   Continuous Infusions: . piperacillin-tazobactam (ZOSYN)  IV Stopped (07/13/17 0228)     LOS: 1 day     [name], MD [facility]  If 7PM-7AM, please contact  night-coverage [email or web address] Password [facility] 07/13/2017, 9:36 AM

## 2017-07-13 NOTE — Consult Note (Addendum)
[facility] Consult Note     Date: 07/13/2017                  Patient Name:  Charles Vasquez  MRN: [id number]  DOB: Feb 01, 1936  Age / Sex: 82 y.o., male         PCP: Neale Burly, MD                 Service Requesting Consult: [facility] Dr. Rodena Piety                 Reason for Consult: ESRD on HD            Chief Complaint: R foot infection HPI: 82 yo male with ESRD on MWF HD, HTN, HLD, DM, stroke, GERD, CAD, PVD who presented with a R foot infection.  He recently had an angiogram with unsuccessful revascularization of R posterior tibial artery 5 weeks ago and now has worsening R foot infection with purulent malodorous drainage. He is a dialysis patient at [facility] on a MWF schedule, no missed sessions. Denies chest pain, shortness of breath, leg swelling.  Past Medical History:  Diagnosis Date  . Anal pain   . AVF (arteriovenous fistula) ([place]) 05-13-2017 per pt currently AVF access used is left thigh   hx multiple AVF surgery's (previously left upper arm, bilateral thigh) last surgery -- right upper arm creation 11-26-2016  . ESRD (end stage renal disease) on dialysis [facility])    [facility] in [place], [place] on MWF   . History of CVA (cerebrovascular accident)    05-13-2017  per pt stated "thats what they told yrs ago"  per pt no residual  . Hyperlipidemia   . Hypertension   . Myocardial infarction ([place])   . Stroke ([place])   . Type 2 diabetes mellitus treated with insulin [facility])     Past Surgical History:  Procedure Laterality Date  . ABDOMINAL AORTOGRAM W/LOWER EXTREMITY N/A 06/25/2017   Procedure: ABDOMINAL AORTOGRAM W/LOWER EXTREMITY;  Surgeon: Waynetta Sandy, MD;  Location: [place] [facility];  Service: Cardiovascular;  Laterality: N/A;  . AMPUTATION Right 01/01/2017   Procedure: REVISION AMPUTATION RIGHT INDEX FINGER;  Surgeon: Charlotte Crumb, MD;  Location: [place];  Service: Orthopedics;  Laterality: Right;  . ARTERIOVENOUS  GRAFT PLACEMENT    . ARTERIOVENOUS GRAFT PLACEMENT Left 10/22/2016   thigh  . AV FISTULA PLACEMENT  03/31/2012   Procedure: ARTERIOVENOUS (AV) FISTULA CREATION;  Surgeon: Conrad Mitchell, MD;  Location: [place];  Service: Vascular;  Laterality: Right;  First stage Brachial vein transposition  . AV FISTULA PLACEMENT Right 08/25/2012   Procedure: INSERTION OF ARTERIOVENOUS (AV) GORE-TEX GRAFT ARM;  Surgeon: Conrad Bloomer, MD;  Location: [place];  Service: Vascular;  Laterality: Right;  . AV FISTULA PLACEMENT Left 07/23/2016   Procedure: INSERTION OF ARTERIOVENOUS (AV) GORE-TEX GRAFT LEFT UPPER ARM;  Surgeon: Conrad [place], MD;  Location: [place];  Service: Vascular;  Laterality: Left;  . AV FISTULA PLACEMENT Left 10/22/2016   Procedure: INSERTION OF 4-60m x 45cm  ARTERIOVENOUS (AV) GORE-TEX GRAFT THIGH-LEFT;  Surgeon: CConrad [place] MD;  Location: [place]  Service: Vascular;  Laterality: Left;  . CATARACT EXTRACTION W/ INTRAOCULAR LENS  IMPLANT, BILATERAL    . COLONOSCOPY N/A 03/25/2017   Dr. FOneida Alar examined portion of ileum normal. Redundant left colon, stricture at anus   . ESOPHAGOGASTRODUODENOSCOPY N/A 03/25/2017   Dr. FOneida Alar widely patent Schatzki ring at GE junction  s/p dilation, atrophic gastritis, single duodenal AVM, non-bleeding  . INSERTION OF DIALYSIS CATHETER Left 05/05/2012   Procedure: INSERTION OF DIALYSIS CATHETER;  Surgeon: Angelia Mould, MD;  Location: [place];  Service: Vascular;  Laterality: Left;  . INSERTION OF DIALYSIS CATHETER Right 10/01/2016   Procedure: INSERTION OF DIALYSIS CATHETER- RIGHT FEMORAL;  Surgeon: Conrad [place], MD;  Location: [place];  Service: Vascular;  Laterality: Right;  . IR FLUORO GUIDE CV LINE RIGHT  06/10/2016  . IR GENERIC HISTORICAL  06/07/2016   IR US GUIDE VASC ACCESS RIGHT 06/07/2016 Arne Cleveland, MD [facility]INTER[facility]  . IR THROMBECTOMY AV FISTULA W/THROMBOLYSIS/PTA INC/SHUNT/IMG RIGHT Right 06/07/2016  . IR US GUIDE VASC ACCESS RIGHT  06/10/2016  . LIGATION  ARTERIOVENOUS GORTEX GRAFT Left 08/04/2016   Procedure: LIGATION ARTERIOVENOUS GORTEX GRAFT;  Surgeon: Rosetta Posner, MD;  Location: [place];  Service: Vascular;  Laterality: Left;  . LIGATION ARTERIOVENOUS GORTEX GRAFT Right 11/26/2016   Procedure: LIGATION OF RIGHT UPPER ARM ARTERIOVENOUS GORTEX GRAFT;  Surgeon: Conrad Relampago, MD;  Location: [place];  Service: Vascular;  Laterality: Right;  . LIGATION OF ARTERIOVENOUS  FISTULA Right 08/25/2012   Procedure: LIGATION OF ARTERIOVENOUS  FISTULA;  Surgeon: Conrad [place], MD;  Location: [place];  Service: Vascular;  Laterality: Right;  Ligation of right brachial vein transposition  . PERIPHERAL VASCULAR ATHERECTOMY  06/25/2017   Procedure: PERIPHERAL VASCULAR ATHERECTOMY;  Surgeon: Waynetta Sandy, MD;  Location: [place] [facility];  Service: Cardiovascular;;  Rt. PT  . REMOVAL OF A DIALYSIS CATHETER Right 05/05/2012   Procedure: REMOVAL OF A DIALYSIS CATHETER;  Surgeon: Angelia Mould, MD;  Location: [place];  Service: Vascular;  Laterality: Right;  . REMOVAL OF A DIALYSIS CATHETER Right 10/01/2016   Procedure: REMOVAL OF A DIALYSIS CATHETER-RIGHT UPPER CHEST;  Surgeon: Conrad [place], MD;  Location: [place];  Service: Vascular;  Laterality: Right;  . REMOVAL OF A DIALYSIS CATHETER Right 11/26/2016   Procedure: REMOVAL OF RIGHT FEMORAL TUNNEL DIALYSIS CATHETER;  Surgeon: Conrad Northwood, MD;  Location: [place];  Service: Vascular;  Laterality: Right;  . SPHINCTEROTOMY N/A 05/15/2017   Procedure: POSSIBLE SPHINCTEROTOMY (BOTOX);  Surgeon: Leighton Ruff, MD;  Location: [facility];  Service: General;  Laterality: N/A;  . TOE AMPUTATION  02/2007   left foot first 3 toes  . UPPER EXTREMITY VENOGRAPHY Bilateral 07/18/2016   Procedure: Bilateral Upper Extremity Venography;  Surgeon: Conrad , MD;  Location: [place] [facility];  Service: Cardiovascular;  Laterality: Bilateral;    Family History  Problem Relation Age of Onset  . Diabetes  Mother   . Hypertension Mother   . AAA (abdominal aortic aneurysm) Mother   . Diabetes Father   . Hypertension Father    Social History:  reports that he has never smoked. He has never used smokeless tobacco. He reports that he does not drink alcohol or use drugs.  Allergies:  Allergies  Allergen Reactions  . Lisinopril Other (See Comments)    HYPERKALEMIA RENAL DYSFUNCTION PROGRESSION    Medications Prior to Admission  Medication Sig Dispense Refill  . calcium carbonate (TUMS - DOSED IN MG ELEMENTAL CALCIUM) 500 MG chewable tablet Chew 1 tablet by mouth daily as needed for indigestion or heartburn.    . carboxymethylcellulose (REFRESH PLUS) 0.5 % SOLN Place 1 drop into both eyes 3 (three) times daily as needed (dry eyes). AS DIRECTED     . dicyclomine (BENTYL) 10 MG  capsule 1 PO 30 MINUTES PRIOR TO BREAKFAST AND LUNCH (Patient taking differently: Take 10 mg by mouth 2 (two) times daily. ) 62 capsule 11  . insulin glargine (LANTUS) 100 UNIT/ML injection Inject 5 Units into the skin at bedtime.     Marland Kitchen losartan (COZAAR) 50 MG tablet Take 50 mg by mouth every evening.     . multivitamin (RENA-VIT) TABS tablet Take 1 tablet by mouth daily.    . pantoprazole (PROTONIX) 40 MG tablet 1 PO 30 MINUTES PRIOR TO MEALS BID FOR 3 MOS THEN QD (Patient taking differently: Take 40 mg by mouth 2 (two) times daily. ) 60 tablet 11  . Polysacch Fe Cmp-Fe Heme Poly (BIFERA) 28 MG TABS 1 po bid (Patient taking differently: Take 28 mg by mouth 2 (two) times daily. 1 po bid) 60 tablet 11  . sevelamer carbonate (RENVELA) 800 MG tablet Take 1,600 mg by mouth 3 (three) times daily with meals. Supposed to take one 800 mg with snacks and caregiver said he only does that sometimes    . SSD 1 % cream Apply 1 application topically daily.    . traMADol (ULTRAM) 50 MG tablet Take 1 tablet (50 mg total) by mouth every 6 (six) hours as needed. 30 tablet 0    Results for orders placed or performed during the hospital  encounter of 07/11/17 (from the past 48 hour(s))  Comprehensive metabolic panel     Status: Abnormal   Collection Time: 07/11/17  5:54 PM  Result Value Ref Range   Sodium 133 (L) 135 - 145 mmol/L   Potassium 4.6 3.5 - 5.1 mmol/L   Chloride 92 (L) 101 - 111 mmol/L   CO2 27 22 - 32 mmol/L   Glucose, Bld 387 (H) 65 - 99 mg/dL   BUN 29 (H) 6 - 20 mg/dL   Creatinine, Ser 5.43 (H) 0.61 - 1.24 mg/dL   Calcium 7.5 (L) 8.9 - 10.3 mg/dL   Total Protein 7.1 6.5 - 8.1 g/dL   Albumin 2.3 (L) 3.5 - 5.0 g/dL   AST 26 15 - 41 U/L   ALT 24 17 - 63 U/L   Alkaline Phosphatase 160 (H) 38 - 126 U/L   Total Bilirubin 0.5 0.3 - 1.2 mg/dL   GFR calc non Af Amer 9 (L) >60 mL/min   GFR calc Af Amer 10 (L) >60 mL/min    Comment: (NOTE) The eGFR has been calculated using the CKD EPI equation. This calculation has not been validated in all clinical situations. eGFR's persistently <60 mL/min signify possible Chronic Kidney Disease.    Anion gap 14 5 - 15    Comment: Performed at [facility] [address], [place], [place] [address]  CBC with Differential     Status: Abnormal   Collection Time: 07/11/17  5:54 PM  Result Value Ref Range   WBC 15.6 (H) 4.0 - 10.5 K/uL   RBC 3.83 (L) 4.22 - 5.81 MIL/uL   Hemoglobin 10.1 (L) 13.0 - 17.0 g/dL   HCT 33.0 (L) 39.0 - 52.0 %   MCV 86.2 78.0 - 100.0 fL   MCH 26.4 26.0 - 34.0 pg   MCHC 30.6 30.0 - 36.0 g/dL   RDW 16.5 (H) 11.5 - 15.5 %   Platelets 202 150 - 400 K/uL   Neutrophils Relative % 88 %   Neutro Abs 13.6 (H) 1.7 - 7.7 K/uL   Lymphocytes Relative 4 %   Lymphs Abs 0.6 (L) 0.7 - 4.0  K/uL   Monocytes Relative 8 %   Monocytes Absolute 1.3 (H) 0.1 - 1.0 K/uL   Eosinophils Relative 0 %   Eosinophils Absolute 0.1 0.0 - 0.7 K/uL   Basophils Relative 0 %   Basophils Absolute 0.0 0.0 - 0.1 K/uL    Comment: Performed at [facility] [address] [place], [place] [address]  I-Stat CG4 Lactic Acid, ED     Status: None   Collection Time:  07/11/17  6:13 PM  Result Value Ref Range   Lactic Acid, Venous 1.24 0.5 - 1.9 mmol/L  Sedimentation rate     Status: Abnormal   Collection Time: 07/12/17  2:13 AM  Result Value Ref Range   Sed Rate 110 (H) 0 - 16 mm/hr    Comment: Performed at [facility], [place] [address], [place], [place] [address]  C-reactive protein     Status: Abnormal   Collection Time: 07/12/17  2:13 AM  Result Value Ref Range   CRP 21.4 (H) <1.0 mg/dL    Comment: Performed at [facility] [address] [place], [place] [address]  Procalcitonin     Status: None   Collection Time: 07/12/17  2:13 AM  Result Value Ref Range   Procalcitonin 21.59 ng/mL    Comment:        Interpretation: PCT >= 10 ng/mL: Important systemic inflammatory response, almost exclusively due to severe bacterial sepsis or septic shock. (NOTE)       Sepsis PCT Algorithm           Lower Respiratory Tract                                      Infection PCT Algorithm    ----------------------------     ----------------------------         PCT < 0.25 ng/mL                PCT < 0.10 ng/mL         Strongly encourage             Strongly discourage   discontinuation of antibiotics    initiation of antibiotics    ----------------------------     -----------------------------       PCT 0.25 - 0.50 ng/mL            PCT 0.10 - 0.25 ng/mL               OR       >80% decrease in PCT            Discourage initiation of                                            antibiotics      Encourage discontinuation           of antibiotics    ----------------------------     -----------------------------         PCT >= 0.50 ng/mL              PCT 0.26 - 0.50 ng/mL                AND       <80% decrease in PCT  Encourage initiation of                                             antibiotics       Encourage continuation           of antibiotics    ----------------------------     -----------------------------        PCT >= 0.50  ng/mL                  PCT > 0.50 ng/mL               AND         increase in PCT                  Strongly encourage                                      initiation of antibiotics    Strongly encourage escalation           of antibiotics                                     -----------------------------                                           PCT <= 0.25 ng/mL                                                 OR                                        > 80% decrease in PCT                                     Discontinue / Do not initiate                                             antibiotics Performed at [facility], [address], [place], [place] [address]   Protime-INR     Status: None   Collection Time: 07/12/17  2:13 AM  Result Value Ref Range   Prothrombin Time 14.3 11.4 - 15.2 seconds   INR 1.12     Comment: Performed at [facility] [address], [place], [place] [address]  APTT     Status: Abnormal   Collection Time: 07/12/17  2:13 AM  Result Value Ref Range   aPTT 42 (H) 24 - 36 seconds    Comment:        IF BASELINE aPTT IS ELEVATED, SUGGEST PATIENT RISK ASSESSMENT BE USED TO DETERMINE APPROPRIATE ANTICOAGULANT THERAPY. Performed at  [facility], [place] [address], [place], [place] [address]   Lactic acid, plasma     Status: None   Collection Time: 07/12/17  2:15 AM  Result Value Ref Range   Lactic Acid, Venous 1.3 0.5 - 1.9 mmol/L    Comment: Performed at [facility] [address] [place], [place] [address]  Culture, blood (routine x 2)     Status: None (Preliminary result)   Collection Time: 07/12/17  3:47 AM  Result Value Ref Range   Specimen Description BLOOD LEFT ANTECUBITAL    Special Requests      BOTTLES DRAWN AEROBIC AND ANAEROBIC Blood Culture adequate volume   Culture  Setup Time      GRAM POSITIVE RODS ANAEROBIC BOTTLE ONLY CRITICAL RESULT CALLED TO, READ BACK BY AND VERIFIED WITH: M MACCIA,PHARMD AT 1601 07/12/17 BY L  BENFIELD GRAM NEGATIVE RODS GRAM POSITIVE COCCI IN CHAINS IN BOTH AEROBIC AND ANAEROBIC BOTTLES Performed at [facility], [place] [address] [place], [place] [address]    Culture      GRAM POSITIVE RODS GRAM POSITIVE COCCI GRAM NEGATIVE RODS    Report Status PENDING   Blood Culture ID Panel (Reflexed)     Status: Abnormal   Collection Time: 07/12/17  3:47 AM  Result Value Ref Range   Enterococcus species NOT DETECTED NOT DETECTED   Listeria monocytogenes NOT DETECTED NOT DETECTED   Staphylococcus species NOT DETECTED NOT DETECTED   Staphylococcus aureus NOT DETECTED NOT DETECTED   Streptococcus species DETECTED (A) NOT DETECTED    Comment: Not Enterococcus species, Streptococcus agalactiae, Streptococcus pyogenes, or Streptococcus pneumoniae. CRITICAL RESULT CALLED TO, READ BACK BY AND VERIFIED WITH: M MACCIA,PHARMD AT 1614 07/12/17 BY L BENFIELD    Streptococcus agalactiae NOT DETECTED NOT DETECTED   Streptococcus pneumoniae NOT DETECTED NOT DETECTED   Streptococcus pyogenes NOT DETECTED NOT DETECTED   Acinetobacter baumannii NOT DETECTED NOT DETECTED   Enterobacteriaceae species DETECTED (A) NOT DETECTED    Comment: Enterobacteriaceae represent a large family of gram-negative bacteria, not a single organism. CRITICAL RESULT CALLED TO, READ BACK BY AND VERIFIED WITH: M MACCIA,PHARMD AT 1614 07/12/17 BY L BENFIELD    Enterobacter cloacae complex NOT DETECTED NOT DETECTED   Escherichia coli NOT DETECTED NOT DETECTED   Klebsiella oxytoca NOT DETECTED NOT DETECTED   Klebsiella pneumoniae NOT DETECTED NOT DETECTED   Proteus species DETECTED (A) NOT DETECTED    Comment: CRITICAL RESULT CALLED TO, READ BACK BY AND VERIFIED WITH: M MACCIA,PHARMD AT 1614 07/12/17 BY L BENFIELD    Serratia marcescens NOT DETECTED NOT DETECTED   Carbapenem resistance NOT DETECTED NOT DETECTED   Haemophilus influenzae NOT DETECTED NOT DETECTED   Neisseria meningitidis NOT DETECTED NOT DETECTED    Pseudomonas aeruginosa NOT DETECTED NOT DETECTED   Candida albicans NOT DETECTED NOT DETECTED   Candida glabrata NOT DETECTED NOT DETECTED   Candida krusei NOT DETECTED NOT DETECTED   Candida parapsilosis NOT DETECTED NOT DETECTED   Candida tropicalis NOT DETECTED NOT DETECTED    Comment: Performed at [facility], [place] [address], [place], [place] [address]  MRSA PCR Screening     Status: None   Collection Time: 07/12/17  5:26 AM  Result Value Ref Range   MRSA by PCR NEGATIVE NEGATIVE    Comment:        The GeneXpert MRSA Assay (FDA approved for NASAL specimens only), is one component of a comprehensive MRSA colonization surveillance program. It  is not intended to diagnose MRSA infection nor to guide or monitor treatment for MRSA infections. Performed at [facility], [place] [address], [place], [place] [address]   Glucose, capillary     Status: Abnormal   Collection Time: 07/12/17  5:34 AM  Result Value Ref Range   Glucose-Capillary 303 (H) 65 - 99 mg/dL  Culture, blood (routine x 2)     Status: None (Preliminary result)   Collection Time: 07/12/17  7:58 AM  Result Value Ref Range   Specimen Description BLOOD LEFT HAND    Special Requests      BOTTLES DRAWN AEROBIC ONLY Blood Culture results may not be optimal due to an inadequate volume of blood received in culture bottles   Culture      NO GROWTH 1 DAY Performed at [facility] [address], [place], [place] [address]    Report Status PENDING   Glucose, capillary     Status: Abnormal   Collection Time: 07/12/17 12:05 PM  Result Value Ref Range   Glucose-Capillary 42 (LL) 65 - 99 mg/dL   Comment 1 Notify RN    Comment 2 Document in Chart   Glucose, capillary     Status: Abnormal   Collection Time: 07/12/17 12:08 PM  Result Value Ref Range   Glucose-Capillary 46 (L) 65 - 99 mg/dL   Comment 1 Notify RN    Comment 2 Document in Chart   Glucose, capillary     Status: None   Collection Time: 07/12/17  12:22 PM  Result Value Ref Range   Glucose-Capillary 75 65 - 99 mg/dL  Glucose, capillary     Status: Abnormal   Collection Time: 07/12/17 12:54 PM  Result Value Ref Range   Glucose-Capillary 127 (H) 65 - 99 mg/dL  Glucose, capillary     Status: Abnormal   Collection Time: 07/12/17  4:12 PM  Result Value Ref Range   Glucose-Capillary 190 (H) 65 - 99 mg/dL   Comment 1 Notify RN    Comment 2 Document in Chart   Glucose, capillary     Status: Abnormal   Collection Time: 07/12/17  9:54 PM  Result Value Ref Range   Glucose-Capillary 249 (H) 65 - 99 mg/dL  CBC with Differential/Platelet     Status: Abnormal   Collection Time: 07/13/17  2:48 AM  Result Value Ref Range   WBC 18.5 (H) 4.0 - 10.5 K/uL   RBC 3.65 (L) 4.22 - 5.81 MIL/uL   Hemoglobin 9.7 (L) 13.0 - 17.0 g/dL   HCT 31.4 (L) 39.0 - 52.0 %   MCV 86.0 78.0 - 100.0 fL   MCH 26.6 26.0 - 34.0 pg   MCHC 30.9 30.0 - 36.0 g/dL   RDW 16.3 (H) 11.5 - 15.5 %   Platelets 222 150 - 400 K/uL   Neutrophils Relative % 90 %   Neutro Abs 16.7 (H) 1.7 - 7.7 K/uL   Lymphocytes Relative 4 %   Lymphs Abs 0.8 0.7 - 4.0 K/uL   Monocytes Relative 5 %   Monocytes Absolute 0.9 0.1 - 1.0 K/uL   Eosinophils Relative 1 %   Eosinophils Absolute 0.1 0.0 - 0.7 K/uL   Basophils Relative 0 %   Basophils Absolute 0.0 0.0 - 0.1 K/uL    Comment: Performed at [facility], [address] [place], [place] [address]  Basic metabolic panel     Status: Abnormal   Collection Time: 07/13/17  2:48 AM  Result  Value Ref Range   Sodium 134 (L) 135 - 145 mmol/L   Potassium 5.0 3.5 - 5.1 mmol/L   Chloride 94 (L) 101 - 111 mmol/L   CO2 25 22 - 32 mmol/L   Glucose, Bld 248 (H) 65 - 99 mg/dL   BUN 50 (H) 6 - 20 mg/dL   Creatinine, Ser 8.23 (H) 0.61 - 1.24 mg/dL    Comment: DELTA CHECK NOTED   Calcium 7.5 (L) 8.9 - 10.3 mg/dL   GFR calc non Af Amer 5 (L) >60 mL/min   GFR calc Af Amer 6 (L) >60 mL/min    Comment: (NOTE) The eGFR has been calculated using the  CKD EPI equation. This calculation has not been validated in all clinical situations. eGFR's persistently <60 mL/min signify possible Chronic Kidney Disease.    Anion gap 15 5 - 15    Comment: Performed at [facility] [address] [place], [place] [address]  Glucose, capillary     Status: Abnormal   Collection Time: 07/13/17  6:23 AM  Result Value Ref Range   Glucose-Capillary 241 (H) 65 - 99 mg/dL  Glucose, capillary     Status: Abnormal   Collection Time: 07/13/17 11:49 AM  Result Value Ref Range   Glucose-Capillary 260 (H) 65 - 99 mg/dL   Comment 1 Notify RN    Comment 2 Document in Chart    No results found.  Review of Systems  Constitutional: Negative for chills and fever.  Respiratory: Negative for cough, shortness of breath and wheezing.   Cardiovascular: Negative for chest pain, orthopnea and leg swelling.  Gastrointestinal: Negative for abdominal pain, constipation, diarrhea, nausea and vomiting.  Genitourinary: Negative for dysuria, frequency, hematuria and urgency.  Skin:       R foot wound with pus and foul odor    Blood pressure (!) 142/59, pulse 83, temperature 98.8 F (37.1 C), temperature source Oral, resp. rate 17, height '5\' 7"'  (1.702 m), weight 132 lb 7.9 oz (60.1 kg), SpO2 100 %. Physical Exam  Constitutional: He appears well-developed and well-nourished. No distress.  HENT:  Head: Normocephalic and atraumatic.  Eyes: Conjunctivae are normal.  Neck: Normal range of motion. Neck supple. No JVD present.  Cardiovascular:  No murmur heard. S1 and S2 with some irregular beats. No murmur or gallops  Respiratory: Effort normal and breath sounds normal. No respiratory distress. He has no wheezes. He has no rales.  GI: Soft. Bowel sounds are normal. He exhibits no distension. There is no tenderness. There is no rebound and no guarding.  Musculoskeletal: He exhibits no edema.  R foot wound at heel with packing. Notable for purulent drainage and very  malodorous.  Neurological: He is alert. He exhibits normal muscle tone.     Assessment/Plan 82 yo male with ESRD on MWF HD, HTN, HLD, DM, stroke, GERD, CAD, PVD who presented with a R foot infection.  He recently had an angiogram with unsuccessful revascularization of R posterior tibial artery 5 weeks ago and now has worsening R foot infection with purulent malodorous drainage. He is a dialysis patient at [facility] on a MWF schedule. 1. ESRD on MWF HD. HD early tomorrow on Monday 07/14/17 based around surgery schedule. Will touch base with [facility] regarding timing. Continue home renvela. 2. Diabetic foot wound. On vanc/ zosyn per primary. Has polymicrobial bacteremia.  Planned amputation early next week. 3. HTN. Continue losartan 4. Anemia. Hgb: 9.7 compared to baseline 10-11. Continue home iron supplementation.  5.  Diabetes per primary  Bufford Lope, DO PGY-2, [facility] 07/13/2017 11:53 AM   Pt seen, examined, agree w assess/plan as above with additions as indicated.  Kelly Splinter MD [employer] pager [phone]    cell [phone] 07/13/2017, 2:14 PM

## 2017-07-14 ENCOUNTER — Encounter (HOSPITAL_COMMUNITY): Payer: Self-pay

## 2017-07-14 LAB — GLUCOSE, CAPILLARY
GLUCOSE-CAPILLARY: 42 mg/dL — AB (ref 65–99)
Glucose-Capillary: 87 mg/dL (ref 65–99)
Glucose-Capillary: 115 mg/dL — ABNORMAL HIGH (ref 65–99)

## 2017-07-14 MED ORDER — SODIUM CHLORIDE 0.9 % IV SOLN: 100.0000 mL | INTRAVENOUS | Status: DC | PRN

## 2017-07-14 MED ORDER — TRAMADOL HCL 50 MG PO TABS
ORAL_TABLET | ORAL | Status: AC
  Filled 2017-07-14: qty 1

## 2017-07-14 MED ORDER — INSULIN GLARGINE 100 UNIT/ML ~~LOC~~ SOLN
3.0000 [IU] | Freq: Every day | SUBCUTANEOUS | Status: DC
  Administered 2017-07-14 – 2017-07-15 (×2): 3 [IU] via SUBCUTANEOUS
  Filled 2017-07-14 (×2): qty 0.03

## 2017-07-14 MED ORDER — PENTAFLUOROPROP-TETRAFLUOROETH EX AERO: 1.0000 "application " | INHALATION_SPRAY | CUTANEOUS | Status: DC | PRN

## 2017-07-14 MED ORDER — HEPARIN SODIUM (PORCINE) 1000 UNIT/ML DIALYSIS: 2500.0000 [IU] | INTRAMUSCULAR | Status: DC | PRN

## 2017-07-14 MED ORDER — LIDOCAINE-PRILOCAINE 2.5-2.5 % EX CREA: 1.0000 "application " | TOPICAL_CREAM | CUTANEOUS | Status: DC | PRN

## 2017-07-14 NOTE — Consult Note (Signed)
   [facility] CM Inpatient Consult   07/14/2017  Charles Vasquez 1936/01/27 [id number]    Patient screened for [facility] Care Management services.   Chart reviewed. Noted BKA scheduled for tomorrow.  Spoke with inpatient RNCM to make aware writer following for potential [facility] Care Management needs. Specifically for medication management and medication review. [facility] Pharmacist was unable to reach patient previously.   Will continue to follow for disposition needs and plans.   [name], MSN-Ed, RN,BSN [facility] Liaison [phone]

## 2017-07-14 NOTE — Progress Notes (Addendum)
  Progress Note    07/14/2017 10:58 AM  Subjective:  Seen during dialysis.  Patient is complaining of pain in R heel   Vitals:   07/14/17 0930 07/14/17 1000  BP: 119/62 119/60  Pulse: 80 86  Resp: (!) 24 (!) 22  Temp:    SpO2:     Physical Exam: Lungs:  Non labored on 02 by  Extremities:  Foul odor R heel, gangrenous ulceration Abdomen:  Soft Neurologic: A&O  CBC    Component Value Date/Time   WBC 18.5 (H) 07/13/2017 0248   RBC 3.65 (L) 07/13/2017 0248   HGB 9.7 (L) 07/13/2017 0248   HGB 11.8 (L) 11/29/2011 0614   HCT 31.4 (L) 07/13/2017 0248   HCT 34.9 (L) 11/29/2011 0614   PLT 222 07/13/2017 0248   PLT 97 (L) 11/29/2011 0614   MCV 86.0 07/13/2017 0248   MCV 92 11/29/2011 0614   MCH 26.6 07/13/2017 0248   MCHC 30.9 07/13/2017 0248   RDW 16.3 (H) 07/13/2017 0248   RDW 13.8 11/29/2011 0614   LYMPHSABS 0.8 07/13/2017 0248   LYMPHSABS 0.9 (L) 11/29/2011 0614   MONOABS 0.9 07/13/2017 0248   MONOABS 0.3 11/29/2011 0614   EOSABS 0.1 07/13/2017 0248   EOSABS 0.0 11/29/2011 0614   BASOSABS 0.0 07/13/2017 0248   BASOSABS 0.0 11/29/2011 0614    BMET    Component Value Date/Time   NA 134 (L) 07/13/2017 0248   NA 138 10/15/2011 1339   K 5.0 07/13/2017 0248   K 4.5 11/28/2011 1340   CL 94 (L) 07/13/2017 0248   CL 104 10/15/2011 1339   CO2 25 07/13/2017 0248   CO2 24 10/15/2011 1339   GLUCOSE 248 (H) 07/13/2017 0248   GLUCOSE 121 (H) 10/15/2011 1339   BUN 50 (H) 07/13/2017 0248   BUN 47 (H) 10/15/2011 1339   CREATININE 8.23 (H) 07/13/2017 0248   CREATININE 3.88 (H) 10/15/2011 1339   CALCIUM 7.5 (L) 07/13/2017 0248   CALCIUM 8.1 (L) 10/15/2011 1339   GFRNONAA 5 (L) 07/13/2017 0248   GFRNONAA 14 (L) 10/15/2011 1339   GFRAA 6 (L) 07/13/2017 0248   GFRAA 17 (L) 10/15/2011 1339    INR    Component Value Date/Time   INR 1.12 07/12/2017 0213     Intake/Output Summary (Last 24 hours) at 07/14/2017 1058 Last data filed at 07/13/2017 1700 Gross per 24 hour    Intake 480 ml  Output -  Net 480 ml     Assessment/Plan:  82 y.o. male with non healing wound R heel with PAD not amendable by surgical or percutaneous revascularization   Plan is for R BKA tomorrow by Dr. Randie Heinz Patient is aware and is consenting to proceed Pain medication as needed NPO past midnight   [name], PA-C [facility] [phone] 07/14/2017 10:58 AM  I have independently interviewed and examined patient and agree with PA assessment and plan above.   Brandon C. Randie Heinz, MD [facility] [place] Office: [phone] Pager: [phone]

## 2017-07-14 NOTE — Consult Note (Addendum)
WOC consult requested prior to vascular team involvement.  Refer to their progress note form today at 11:00. They plan to perform BKA tomorrow for heel wound.  Please refer to Vascular service for further questions. Please re-consult if further assistance is needed.  Thank-you,  Cammie Mcgee MSN, RN, CWOCN, [place], CNS [phone]

## 2017-07-14 NOTE — Progress Notes (Signed)
Patient returned from HD asleep, in no acute distress.  Vital signs and CBG taken on arrival, CBG 115.  Lunch tray ordered.  Will continue to monitor.

## 2017-07-14 NOTE — Progress Notes (Signed)
Inpatient Diabetes Program Recommendations  AACE/ADA: New Consensus Statement on Inpatient Glycemic Control (2019)  Target Ranges:  Prepandial:   less than 140 mg/dL      Peak postprandial:   less than 180 mg/dL (1-2 hours)      Critically ill patients:  140 - 180 mg/dL   Results for Charles Vasquez, Charles Vasquez (MRN [id number]) as of 07/14/2017 09:57  Ref. Range 07/13/2017 06:23 07/13/2017 11:49 07/13/2017 16:30 07/13/2017 21:30 07/14/2017 06:14  Glucose-Capillary Latest Ref Range: 65 - 99 mg/dL 979 (H) 892 (H) 119 (H) 311 (H) 87   Review of Glycemic Control  Diabetes history: DM2 Outpatient Diabetes medications: Lantus 5 units QHS Current orders for Inpatient glycemic control: Lantus 5 units QHS, Novolog 0-9 units TID with meals  Inpatient Diabetes Program Recommendations: Insulin - Basal: Noted Lantus started last night and fasting glucose 87 mg/dl today.   Insulin - Meal Coverage: If post prandial glucose consistently greater than 180 mg/dl, please consider ordering Novolog 2 units TID with meals for meal coverage if patient eats at least 50% of meals. A1C: Please consider ordering an A1C to evaluate glycemic control over the past 2-3 months.  Thanks, Orlando Penner, RN, MSN, CDE Diabetes Coordinator Inpatient Diabetes Program [phone] (Team Pager from 8am to 5pm)

## 2017-07-14 NOTE — Progress Notes (Signed)
  [facility] Progress Note    Assessment/ Plan:   82 yo male with ESRD on MWF HD, HTN, HLD, DM, stroke, GERD, CAD, PVD who presented with a R foot infection.  He recently had an angiogram with unsuccessful revascularization of R posterior tibial artery 5 weeks ago and now has worsening R foot infection with purulent malodorous drainage. He is a dialysis patient at [facility] on a MWF schedule. 1. ESRD on MWF HD. Receiving HD today. Continue home renvela. 2. Diabetic foot wound. On vanc/ zosyn per primary. Has polymicrobial bacteremia.  Planned amputation early this week. 3. HTN. Continue losartan 4. Anemia. Hgb: 9.7 compared to baseline 10-11. Continue home iron supplementation.  5. Diabetes per primary  Subjective:   Feels well this morning. R foot continues to be painful and have a foul odor. No CP, SOB.   Objective:   BP (!) 142/57 (BP Location: Left Arm)   Pulse 76   Temp 98.1 F (36.7 C) (Oral)   Resp 15   Ht 5\' 7"  (1.702 m)   Wt 143 lb 11.8 oz (65.2 kg)   SpO2 100%   BMI 22.51 kg/m   Intake/Output Summary (Last 24 hours) at 07/14/2017 0742 Last data filed at 07/13/2017 1700 Gross per 24 hour  Intake 720 ml  Output -  Net 720 ml   Weight change: 11 lb 3.9 oz (5.1 kg)  Physical Exam: Gen: laying in bed, in NAD CVS: regular rate and rhythm, normal S1 and S2, no murmurs Resp:CTAB, normal effort on nasal cannula Abd: soft, nontender, nondistended Ext: R foot heel with skin darkening, packing in place, malodorous. No dressing in place  Imaging: No results found.  Labs: BMET Recent Labs  Lab 07/11/17 1754 07/13/17 0248  NA 133* 134*  K 4.6 5.0  CL 92* 94*  CO2 27 25  GLUCOSE 387* 248*  BUN 29* 50*  CREATININE 5.43* 8.23*  CALCIUM 7.5* 7.5*   CBC Recent Labs  Lab 07/11/17 1754 07/13/17 0248  WBC 15.6* 18.5*  NEUTROABS 13.6* 16.7*  HGB 10.1* 9.7*  HCT 33.0* 31.4*  MCV 86.2 86.0  PLT 202 222    Medications:    . dicyclomine  10 mg  Oral BID WC  . heparin  5,000 Units Subcutaneous Q8H  . insulin aspart  0-9 Units Subcutaneous TID WC  . insulin glargine  5 Units Subcutaneous QHS  . iron polysaccharides  150 mg Oral BID  . losartan  50 mg Oral QPM  . multivitamin  1 tablet Oral Daily  . pantoprazole  40 mg Oral BID WC  . sevelamer carbonate  1,600 mg Oral TID WC  . silver sulfADIAZINE  1 application Topical Daily      Leland Her, DO PGY-2, [facility] 07/14/2017 7:42 AM

## 2017-07-14 NOTE — Progress Notes (Signed)
PROGRESS NOTE    LILBURN STRAW  ZOX:[id number] DOB: 1935-12-05 DOA: 07/11/2017 PCP: Toma Deiters, MD  Brief Narrative:82 y.o.malewith medical history significant ofhypertension, hyperlipidemia, diabetes mellitus, stroke, GERD, ESRD-HD (MWF), CAD,PVD,who presents with right foot wound infection.  Pt recently underwentright lower extremity angiogram withattempted revascularization of his right posterior tibial artery that was unsuccessful 5 weeks ago. Pt has a right foot heel woundinfection, with serosangious drainage and foul smelling odor.Pt was seen by [facility], Dr. Randie Heinz today.Dressing was changed at office.Pt was told that he might have to have an amputation. Pt was sent to ED for admission forantibiotics and pain control. Patient states that he has some moderate of pain, but no fever or chills. He denies chest pain, shortness of breath, cough, nausea, vomiting, diarrhea or abdominal pain no symptoms of UTI or unilateral weakness. Patient states that he had a dialysis on Friday.  ED Course:pt was found to haveWBC 15.6, lactic acid of 1.24, potassium 4.6, bicarbonate 27, creatinine 5.43, BUN 29, temperature 99.8, no tachycardia, no tachypnea, oxygen saturation 98% on room air. Patient is admitted to telemetry bed as inpatient.    Assessment & Plan:   Principal Problem:   Diabetic foot infection (HCC) Active Problems:   Essential hypertension   GERD   ESRD (end stage renal disease) on dialysis (HCC)   CAD (coronary artery disease)   Type II diabetes mellitus with renal manifestations (HCC)   Wound of right foot   Pressure injury of skin  1] diabetic foot wound infection/polimicrobial bacteremia-for R BKA tomorrow. 2] type 2 diabetes patient had episodes of hypoglycemia yesterday from decreased eating and decreased appetite.give half dose tomorrow.  3] hypertension stable on losartan.  4] ESRD dialysis Monday Wednesday Friday.      DVT  prophylaxisheparin Code Status: full Family Communication: none Disposition Plan:tbd  Consultants: vascular  Proceduresnone Antimicrobials:  zosyn Subjective:co right foot pain in dialysis  Objective: Vitals:   07/14/17 0830 07/14/17 0900 07/14/17 0930 07/14/17 1000  BP: 117/60 (!) 117/54 119/62 119/60  Pulse: 81 81 80 86  Resp: 20 20 (!) 24 (!) 22  Temp:      TempSrc:      SpO2:      Weight:      Height:        Intake/Output Summary (Last 24 hours) at 07/14/2017 1129 Last data filed at 07/13/2017 1700 Gross per 24 hour  Intake 480 ml  Output -  Net 480 ml   Filed Weights   07/13/17 0436 07/14/17 0507 07/14/17 0704  Weight: 60.1 kg (132 lb 7.9 oz) 65.2 kg (143 lb 11.8 oz) 61.4 kg (135 lb 5.8 oz)    Examination:  General exam: Appears calm and comfortable  Respiratory system: Clear to auscultation. Respiratory effort normal. Cardiovascular system: S1 & S2 heard, RRR. No JVD, murmurs, rubs, gallops or clicks. No pedal edema. Gastrointestinal system: Abdomen is nondistended, soft and nontender. No organomegaly or masses felt. Normal bowel sounds heard. Central nervous system: Alert and oriented. No focal neurological deficits. Extremities: right foot covered with dressings. Skin: No rashes, lesions or ulcers Psychiatry: Judgement and insight appear normal. Mood & affect appropriate.     Data Reviewed: I have personally reviewed following labs and imaging studies  CBC: Recent Labs  Lab 07/11/17 1754 07/13/17 0248  WBC 15.6* 18.5*  NEUTROABS 13.6* 16.7*  HGB 10.1* 9.7*  HCT 33.0* 31.4*  MCV 86.2 86.0  PLT 202 222   Basic Metabolic Panel: Recent Labs  Lab 07/11/17  1754 07/13/17 0248  NA 133* 134*  K 4.6 5.0  CL 92* 94*  CO2 27 25  GLUCOSE 387* 248*  BUN 29* 50*  CREATININE 5.43* 8.23*  CALCIUM 7.5* 7.5*   GFR: Estimated Creatinine Clearance: 6.3 mL/min (A) (by C-G formula based on SCr of 8.23 mg/dL (H)). Liver Function Tests: Recent Labs  Lab  07/11/17 1754  AST 26  ALT 24  ALKPHOS 160*  BILITOT 0.5  PROT 7.1  ALBUMIN 2.3*   No results for input(s): LIPASE, AMYLASE in the last 168 hours. No results for input(s): AMMONIA in the last 168 hours. Coagulation Profile: Recent Labs  Lab 07/12/17 0213  INR 1.12   Cardiac Enzymes: No results for input(s): CKTOTAL, CKMB, CKMBINDEX, TROPONINI in the last 168 hours. BNP (last 3 results) No results for input(s): PROBNP in the last 8760 hours. HbA1C: No results for input(s): HGBA1C in the last 72 hours. CBG: Recent Labs  Lab 07/13/17 0623 07/13/17 1149 07/13/17 1630 07/13/17 2130 07/14/17 0614  GLUCAP 241* 260* 323* 311* 87   Lipid Profile: No results for input(s): CHOL, HDL, LDLCALC, TRIG, CHOLHDL, LDLDIRECT in the last 72 hours. Thyroid Function Tests: No results for input(s): TSH, T4TOTAL, FREET4, T3FREE, THYROIDAB in the last 72 hours. Anemia Panel: No results for input(s): VITAMINB12, FOLATE, FERRITIN, TIBC, IRON, RETICCTPCT in the last 72 hours. Sepsis Labs: Recent Labs  Lab 07/11/17 1813 07/12/17 0213 07/12/17 0215  PROCALCITON  --  21.59  --   LATICACIDVEN 1.24  --  1.3    Recent Results (from the past 240 hour(s))  Culture, blood (routine x 2)     Status: Abnormal (Preliminary result)   Collection Time: 07/12/17  3:47 AM  Result Value Ref Range Status   Specimen Description BLOOD LEFT ANTECUBITAL  Final   Special Requests   Final    BOTTLES DRAWN AEROBIC AND ANAEROBIC Blood Culture adequate volume   Culture  Setup Time   Final    GRAM POSITIVE RODS ANAEROBIC BOTTLE ONLY CRITICAL RESULT CALLED TO, READ BACK BY AND VERIFIED WITH: M MACCIA,PHARMD AT 1614 07/12/17 BY L BENFIELD GRAM NEGATIVE RODS GRAM POSITIVE COCCI IN CHAINS IN BOTH AEROBIC AND ANAEROBIC BOTTLES    Culture (A)  Final    PROTEUS SPECIES GRAM POSITIVE COCCI GRAM POSITIVE RODS CULTURE REINCUBATED FOR BETTER GROWTH Performed at [facility], [address] [place], [place]  [address]    Report Status PENDING  Incomplete   Organism ID, Bacteria PROTEUS SPECIES  Final      Susceptibility   Proteus species - MIC*    AMPICILLIN >=32 RESISTANT Resistant     CEFAZOLIN >=64 RESISTANT Resistant     CEFEPIME <=1 SENSITIVE Sensitive     CEFTAZIDIME <=1 SENSITIVE Sensitive     CEFTRIAXONE <=1 SENSITIVE Sensitive     CIPROFLOXACIN <=0.25 SENSITIVE Sensitive     GENTAMICIN <=1 SENSITIVE Sensitive     IMIPENEM 8 INTERMEDIATE Intermediate     TRIMETH/SULFA <=20 SENSITIVE Sensitive     AMPICILLIN/SULBACTAM <=2 SENSITIVE Sensitive     PIP/TAZO <=4 SENSITIVE Sensitive     * PROTEUS SPECIES  Blood Culture ID Panel (Reflexed)     Status: Abnormal   Collection Time: 07/12/17  3:47 AM  Result Value Ref Range Status   Enterococcus species NOT DETECTED NOT DETECTED Final   Listeria monocytogenes NOT DETECTED NOT DETECTED Final   Staphylococcus species NOT DETECTED NOT DETECTED Final   Staphylococcus aureus NOT DETECTED NOT DETECTED Final  Streptococcus species DETECTED (A) NOT DETECTED Final    Comment: Not Enterococcus species, Streptococcus agalactiae, Streptococcus pyogenes, or Streptococcus pneumoniae. CRITICAL RESULT CALLED TO, READ BACK BY AND VERIFIED WITH: M MACCIA,PHARMD AT 1614 07/12/17 BY L BENFIELD    Streptococcus agalactiae NOT DETECTED NOT DETECTED Final   Streptococcus pneumoniae NOT DETECTED NOT DETECTED Final   Streptococcus pyogenes NOT DETECTED NOT DETECTED Final   Acinetobacter baumannii NOT DETECTED NOT DETECTED Final   Enterobacteriaceae species DETECTED (A) NOT DETECTED Final    Comment: Enterobacteriaceae represent a large family of gram-negative bacteria, not a single organism. CRITICAL RESULT CALLED TO, READ BACK BY AND VERIFIED WITH: M MACCIA,PHARMD AT 1614 07/12/17 BY L BENFIELD    Enterobacter cloacae complex NOT DETECTED NOT DETECTED Final   Escherichia coli NOT DETECTED NOT DETECTED Final   Klebsiella oxytoca NOT DETECTED NOT DETECTED Final    Klebsiella pneumoniae NOT DETECTED NOT DETECTED Final   Proteus species DETECTED (A) NOT DETECTED Final    Comment: CRITICAL RESULT CALLED TO, READ BACK BY AND VERIFIED WITH: M MACCIA,PHARMD AT 1614 07/12/17 BY L BENFIELD    Serratia marcescens NOT DETECTED NOT DETECTED Final   Carbapenem resistance NOT DETECTED NOT DETECTED Final   Haemophilus influenzae NOT DETECTED NOT DETECTED Final   Neisseria meningitidis NOT DETECTED NOT DETECTED Final   Pseudomonas aeruginosa NOT DETECTED NOT DETECTED Final   Candida albicans NOT DETECTED NOT DETECTED Final   Candida glabrata NOT DETECTED NOT DETECTED Final   Candida krusei NOT DETECTED NOT DETECTED Final   Candida parapsilosis NOT DETECTED NOT DETECTED Final   Candida tropicalis NOT DETECTED NOT DETECTED Final    Comment: Performed at [facility], [address], [place], [place] [address]  MRSA PCR Screening     Status: None   Collection Time: 07/12/17  5:26 AM  Result Value Ref Range Status   MRSA by PCR NEGATIVE NEGATIVE Final    Comment:        The GeneXpert MRSA Assay (FDA approved for NASAL specimens only), is one component of a comprehensive MRSA colonization surveillance program. It is not intended to diagnose MRSA infection nor to guide or monitor treatment for MRSA infections. Performed at [facility], [address] [place], [place] [address]   Culture, blood (routine x 2)     Status: None (Preliminary result)   Collection Time: 07/12/17  7:58 AM  Result Value Ref Range Status   Specimen Description BLOOD LEFT HAND  Final   Special Requests   Final    BOTTLES DRAWN AEROBIC ONLY Blood Culture results may not be optimal due to an inadequate volume of blood received in culture bottles   Culture   Final    NO GROWTH 2 DAYS Performed at [facility], [address], [place], [place] [address]    Report Status PENDING  Incomplete         Radiology Studies: No results found.      Scheduled  Meds: . dicyclomine  10 mg Oral BID WC  . heparin  5,000 Units Subcutaneous Q8H  . insulin aspart  0-9 Units Subcutaneous TID WC  . insulin glargine  5 Units Subcutaneous QHS  . iron polysaccharides  150 mg Oral BID  . losartan  50 mg Oral QPM  . multivitamin  1 tablet Oral Daily  . pantoprazole  40 mg Oral BID WC  . sevelamer carbonate  1,600 mg Oral TID WC  . silver sulfADIAZINE  1  application Topical Daily  . traMADol       Continuous Infusions: . sodium chloride    . piperacillin-tazobactam (ZOSYN)  IV Stopped (07/14/17 0201)     LOS: 2 days      [name], MD If 7PM-7AM, please contact night-coverage [email or web address] Password [email or web address] 07/14/2017, 11:29 AM

## 2017-07-15 ENCOUNTER — Encounter (HOSPITAL_COMMUNITY)
Admission: EM | Disposition: A | Discharge status: Rehabilitation Facility | Payer: Self-pay | Source: Home / Self Care | Attending: Internal Medicine

## 2017-07-15 ENCOUNTER — Inpatient Hospital Stay (HOSPITAL_COMMUNITY): Payer: Medicare Other | Admitting: Anesthesiology

## 2017-07-15 ENCOUNTER — Encounter (HOSPITAL_COMMUNITY): Payer: Self-pay | Admitting: Anesthesiology

## 2017-07-15 ENCOUNTER — Telehealth: Payer: Self-pay | Admitting: Vascular Surgery

## 2017-07-15 HISTORY — PX: AMPUTATION: SHX166

## 2017-07-15 LAB — CBC
HCT: 30.1 % — ABNORMAL LOW (ref 39.0–52.0)
HEMOGLOBIN: 9 g/dL — AB (ref 13.0–17.0)
MCH: 25.9 pg — AB (ref 26.0–34.0)
MCHC: 29.9 g/dL — AB (ref 30.0–36.0)
MCV: 86.7 fL (ref 78.0–100.0)
Platelets: 236 10*3/uL (ref 150–400)
RBC: 3.47 MIL/uL — ABNORMAL LOW (ref 4.22–5.81)
RDW: 16.5 % — AB (ref 11.5–15.5)
WBC: 16.4 10*3/uL — ABNORMAL HIGH (ref 4.0–10.5)

## 2017-07-15 LAB — GLUCOSE, CAPILLARY
GLUCOSE-CAPILLARY: 255 mg/dL — AB (ref 65–99)
GLUCOSE-CAPILLARY: 222 mg/dL — AB (ref 65–99)
Glucose-Capillary: 267 mg/dL — ABNORMAL HIGH (ref 65–99)

## 2017-07-15 LAB — HEMOGLOBIN AND HEMATOCRIT, BLOOD
HCT: 28.9 % — ABNORMAL LOW (ref 39.0–52.0)
Hemoglobin: 8.9 g/dL — ABNORMAL LOW (ref 13.0–17.0)

## 2017-07-15 LAB — BASIC METABOLIC PANEL
ANION GAP: 13 (ref 5–15)
BUN: 33 mg/dL — ABNORMAL HIGH (ref 6–20)
CALCIUM: 7.3 mg/dL — AB (ref 8.9–10.3)
CO2: 26 mmol/L (ref 22–32)
CREATININE: 6.69 mg/dL — AB (ref 0.61–1.24)
Chloride: 93 mmol/L — ABNORMAL LOW (ref 101–111)
GFR, EST AFRICAN AMERICAN: 8 mL/min — AB (ref >60)
GFR, EST NON AFRICAN AMERICAN: 7 mL/min — AB (ref >60)
GLUCOSE: 291 mg/dL — AB (ref 65–99)
Potassium: 4.7 mmol/L (ref 3.5–5.1)
Sodium: 132 mmol/L — ABNORMAL LOW (ref 135–145)

## 2017-07-15 LAB — SURGICAL PCR SCREEN
MRSA, PCR: NEGATIVE
Staphylococcus aureus: NEGATIVE

## 2017-07-15 LAB — POCT I-STAT 4, (NA,K, GLUC, HGB,HCT)
Glucose, Bld: 267 mg/dL — ABNORMAL HIGH (ref 65–99)
HCT: 23 % — ABNORMAL LOW (ref 39.0–52.0)
Hemoglobin: 7.8 g/dL — ABNORMAL LOW (ref 13.0–17.0)
Potassium: 4.3 mmol/L (ref 3.5–5.1)
Sodium: 135 mmol/L (ref 135–145)

## 2017-07-15 LAB — PREPARE RBC (CROSSMATCH)

## 2017-07-15 SURGERY — AMPUTATION BELOW KNEE
Anesthesia: General | Site: Leg Lower | Laterality: Right

## 2017-07-15 MED ORDER — SODIUM CHLORIDE 0.9 % IV SOLN
INTRAVENOUS | Status: DC
  Administered 2017-07-15 (×2): via INTRAVENOUS

## 2017-07-15 MED ORDER — SODIUM CHLORIDE 0.9 % IV SOLN: Freq: Once | INTRAVENOUS | Status: DC

## 2017-07-15 MED ORDER — MORPHINE SULFATE (PF) 4 MG/ML IV SOLN
4.0000 mg | INTRAVENOUS | Status: DC | PRN
  Administered 2017-07-15: 4 mg via INTRAVENOUS
  Filled 2017-07-15: qty 1

## 2017-07-15 MED ORDER — PHENYLEPHRINE 40 MCG/ML (10ML) SYRINGE FOR IV PUSH (FOR BLOOD PRESSURE SUPPORT)
PREFILLED_SYRINGE | INTRAVENOUS | Status: AC
  Filled 2017-07-15: qty 10

## 2017-07-15 MED ORDER — ALBUMIN HUMAN 5 % IV SOLN
12.5000 g | Freq: Once | INTRAVENOUS | Status: AC
  Administered 2017-07-15: 12.5 g via INTRAVENOUS

## 2017-07-15 MED ORDER — ONDANSETRON HCL 4 MG/2ML IJ SOLN
INTRAMUSCULAR | Status: AC
  Filled 2017-07-15: qty 2

## 2017-07-15 MED ORDER — ALBUMIN HUMAN 5 % IV SOLN
INTRAVENOUS | Status: AC
  Filled 2017-07-15: qty 250

## 2017-07-15 MED ORDER — GUAIFENESIN-DM 100-10 MG/5ML PO SYRP: 15.0000 mL | ORAL_SOLUTION | ORAL | Status: DC | PRN

## 2017-07-15 MED ORDER — METOPROLOL TARTRATE 5 MG/5ML IV SOLN
2.0000 mg | INTRAVENOUS | Status: DC | PRN
  Administered 2017-07-17: 5 mg via INTRAVENOUS
  Filled 2017-07-15: qty 5

## 2017-07-15 MED ORDER — ALBUMIN HUMAN 5 % IV SOLN
INTRAVENOUS | Status: DC | PRN
  Administered 2017-07-15: 09:00:00 via INTRAVENOUS

## 2017-07-15 MED ORDER — PROPOFOL 10 MG/ML IV BOLUS
INTRAVENOUS | Status: AC
  Filled 2017-07-15: qty 20

## 2017-07-15 MED ORDER — MEPERIDINE HCL 50 MG/ML IJ SOLN: 6.2500 mg | INTRAMUSCULAR | Status: DC | PRN

## 2017-07-15 MED ORDER — FENTANYL CITRATE (PF) 100 MCG/2ML IJ SOLN
INTRAMUSCULAR | Status: DC | PRN
  Administered 2017-07-15 (×3): 50 ug via INTRAVENOUS

## 2017-07-15 MED ORDER — LABETALOL HCL 5 MG/ML IV SOLN: 10.0000 mg | INTRAVENOUS | Status: DC | PRN

## 2017-07-15 MED ORDER — PROPOFOL 10 MG/ML IV BOLUS
INTRAVENOUS | Status: DC | PRN
  Administered 2017-07-15: 200 mg via INTRAVENOUS

## 2017-07-15 MED ORDER — PHENYLEPHRINE HCL 10 MG/ML IJ SOLN
INTRAVENOUS | Status: DC | PRN
  Administered 2017-07-15: 40 ug/min via INTRAVENOUS

## 2017-07-15 MED ORDER — ONDANSETRON HCL 4 MG/2ML IJ SOLN: 4.0000 mg | Freq: Once | INTRAMUSCULAR | Status: DC | PRN

## 2017-07-15 MED ORDER — HYDROMORPHONE HCL 2 MG/ML IJ SOLN: 0.3000 mg | INTRAMUSCULAR | Status: DC | PRN

## 2017-07-15 MED ORDER — ONDANSETRON HCL 4 MG/2ML IJ SOLN
INTRAMUSCULAR | Status: DC | PRN
  Administered 2017-07-15: 4 mg via INTRAVENOUS

## 2017-07-15 MED ORDER — FENTANYL CITRATE (PF) 250 MCG/5ML IJ SOLN
INTRAMUSCULAR | Status: AC
  Filled 2017-07-15: qty 5

## 2017-07-15 MED ORDER — LIDOCAINE HCL (CARDIAC) PF 100 MG/5ML IV SOSY
PREFILLED_SYRINGE | INTRAVENOUS | Status: DC | PRN
  Administered 2017-07-15: 100 mg via INTRAVENOUS

## 2017-07-15 MED ORDER — PHENOL 1.4 % MT LIQD: 1.0000 | OROMUCOSAL | Status: DC | PRN

## 2017-07-15 MED ORDER — HEPARIN SODIUM (PORCINE) 5000 UNIT/ML IJ SOLN
5000.0000 [IU] | Freq: Three times a day (TID) | INTRAMUSCULAR | Status: DC
  Administered 2017-07-15 – 2017-07-17 (×6): 5000 [IU] via SUBCUTANEOUS
  Filled 2017-07-15 (×6): qty 1

## 2017-07-15 MED ORDER — PHENYLEPHRINE HCL 10 MG/ML IJ SOLN
INTRAMUSCULAR | Status: DC | PRN
  Administered 2017-07-15: 120 ug via INTRAVENOUS
  Administered 2017-07-15: 80 ug via INTRAVENOUS
  Administered 2017-07-15: 120 ug via INTRAVENOUS
  Administered 2017-07-15: 80 ug via INTRAVENOUS

## 2017-07-15 MED ORDER — 0.9 % SODIUM CHLORIDE (POUR BTL) OPTIME
TOPICAL | Status: DC | PRN
  Administered 2017-07-15: 1000 mL

## 2017-07-15 MED ORDER — LIDOCAINE 2% (20 MG/ML) 5 ML SYRINGE
INTRAMUSCULAR | Status: AC
  Filled 2017-07-15: qty 5

## 2017-07-15 MED ORDER — ALUM & MAG HYDROXIDE-SIMETH 200-200-20 MG/5ML PO SUSP: 15.0000 mL | ORAL | Status: DC | PRN

## 2017-07-15 MED ORDER — SODIUM CHLORIDE 0.9 % IV BOLUS
250.0000 mL | Freq: Once | INTRAVENOUS | Status: AC
Start: 2017-07-15 — End: 2017-07-15
  Administered 2017-07-15: 250 mL via INTRAVENOUS

## 2017-07-15 SURGICAL SUPPLY — 57 items
BANDAGE ACE 4X5 VEL STRL LF (GAUZE/BANDAGES/DRESSINGS) ×1 IMPLANT
BANDAGE ACE 6X5 VEL STRL LF (GAUZE/BANDAGES/DRESSINGS) ×1 IMPLANT
BANDAGE ELASTIC 4 VELCRO ST LF (GAUZE/BANDAGES/DRESSINGS) ×2 IMPLANT
BANDAGE ESMARK 6X9 LF (GAUZE/BANDAGES/DRESSINGS) IMPLANT
BLADE SAW SAG 73X25 THK (BLADE) ×1
BLADE SAW SGTL 73X25 THK (BLADE) ×2 IMPLANT
BNDG COHESIVE 6X5 TAN STRL LF (GAUZE/BANDAGES/DRESSINGS) ×3 IMPLANT
BNDG ESMARK 6X9 LF (GAUZE/BANDAGES/DRESSINGS) ×3
BNDG GAUZE ELAST 4 BULKY (GAUZE/BANDAGES/DRESSINGS) ×4 IMPLANT
CANISTER SUCT 3000ML PPV (MISCELLANEOUS) ×3 IMPLANT
CLIP VESOCCLUDE MED 6/CT (CLIP) IMPLANT
COVER BACK TABLE 60X90IN (DRAPES) IMPLANT
COVER SURGICAL LIGHT HANDLE (MISCELLANEOUS) ×3 IMPLANT
CUFF TOURNIQUET SINGLE 24IN (TOURNIQUET CUFF) ×2 IMPLANT
DRAIN CHANNEL 19F RND (DRAIN) IMPLANT
DRAPE HALF SHEET 40X57 (DRAPES) ×5 IMPLANT
DRAPE ORTHO SPLIT 77X108 STRL (DRAPES) ×4
DRAPE SURG ORHT 6 SPLT 77X108 (DRAPES) ×2 IMPLANT
DRSG ADAPTIC 3X8 NADH LF (GAUZE/BANDAGES/DRESSINGS) ×3 IMPLANT
ELECT REM PT RETURN 9FT ADLT (ELECTROSURGICAL) ×3
ELECTRODE REM PT RTRN 9FT ADLT (ELECTROSURGICAL) ×1 IMPLANT
EVACUATOR SILICONE 100CC (DRAIN) IMPLANT
GAUZE SPONGE 4X4 12PLY STRL (GAUZE/BANDAGES/DRESSINGS) ×2 IMPLANT
GAUZE SPONGE 4X4 12PLY STRL LF (GAUZE/BANDAGES/DRESSINGS) ×2 IMPLANT
GLOVE BIO SURGEON STRL SZ7 (GLOVE) ×3 IMPLANT
GLOVE BIO SURGEON STRL SZ7.5 (GLOVE) ×2 IMPLANT
GLOVE BIOGEL PI IND STRL 6.5 (GLOVE) IMPLANT
GLOVE BIOGEL PI IND STRL 7.0 (GLOVE) IMPLANT
GLOVE BIOGEL PI IND STRL 7.5 (GLOVE) ×1 IMPLANT
GLOVE BIOGEL PI INDICATOR 6.5 (GLOVE) ×2
GLOVE BIOGEL PI INDICATOR 7.0 (GLOVE) ×4
GLOVE BIOGEL PI INDICATOR 7.5 (GLOVE) ×2
GOWN STRL REUS W/ TWL LRG LVL3 (GOWN DISPOSABLE) ×3 IMPLANT
GOWN STRL REUS W/TWL LRG LVL3 (GOWN DISPOSABLE) ×6
KIT BASIN OR (CUSTOM PROCEDURE TRAY) ×3 IMPLANT
KIT TURNOVER KIT B (KITS) ×3 IMPLANT
NS IRRIG 1000ML POUR BTL (IV SOLUTION) ×3 IMPLANT
PACK GENERAL/GYN (CUSTOM PROCEDURE TRAY) ×3 IMPLANT
PAD ARMBOARD 7.5X6 YLW CONV (MISCELLANEOUS) ×6 IMPLANT
SPONGE LAP 18X18 X RAY DECT (DISPOSABLE) ×4 IMPLANT
STAPLER VISISTAT 35W (STAPLE) ×3 IMPLANT
STOCKINETTE IMPERVIOUS LG (DRAPES) ×3 IMPLANT
SUT BONE WAX W31G (SUTURE) ×2 IMPLANT
SUT ETHILON 3 0 PS 1 (SUTURE) IMPLANT
SUT SILK 0 TIES 10X30 (SUTURE) ×2 IMPLANT
SUT SILK 2 0 (SUTURE) ×2
SUT SILK 2 0 SH (SUTURE) ×2 IMPLANT
SUT SILK 2 0SH CR/8 30 (SUTURE) ×2 IMPLANT
SUT SILK 2-0 18XBRD TIE 12 (SUTURE) ×1 IMPLANT
SUT SILK 3 0 (SUTURE) ×2
SUT SILK 3-0 18XBRD TIE 12 (SUTURE) ×1 IMPLANT
SUT VIC AB 2-0 CT1 18 (SUTURE) ×6 IMPLANT
SUT VIC AB 3-0 SH 18 (SUTURE) ×3 IMPLANT
TAPE UMBILICAL COTTON 1/8X30 (MISCELLANEOUS) ×3 IMPLANT
TOWEL GREEN STERILE (TOWEL DISPOSABLE) ×4 IMPLANT
UNDERPAD 30X30 (UNDERPADS AND DIAPERS) ×3 IMPLANT
WATER STERILE IRR 1000ML POUR (IV SOLUTION) ×3 IMPLANT

## 2017-07-15 NOTE — Transfer of Care (Signed)
Immediate Anesthesia Transfer of Care Note  Patient: Charles Vasquez  Procedure(s) Performed: AMPUTATION BELOW KNEE RIGHT (Right Leg Lower)  Patient Location: PACU  Anesthesia Type:General  Level of Consciousness: awake, oriented and patient cooperative  Airway & Oxygen Therapy: Patient Spontanous Breathing and Patient connected to nasal cannula oxygen  Post-op Assessment: Report given to RN and Post -op Vital signs reviewed and unstable, Anesthesiologist notified  Post vital signs: Reviewed  Last Vitals:  Vitals Value Taken Time  BP 77/20 07/15/2017 10:15 AM  Temp 36.8 C 07/15/2017 10:07 AM  Pulse    Resp 9 07/15/2017 10:15 AM  SpO2 100 % 07/15/2017 10:07 AM  Vitals shown include unvalidated device data.  Last Pain:  Vitals:   07/15/17 1009  TempSrc:   PainSc: Asleep         Complications: No apparent anesthesia complications

## 2017-07-15 NOTE — Anesthesia Preprocedure Evaluation (Addendum)
Anesthesia Evaluation  Patient identified by MRN, date of birth, ID band Patient awake    Reviewed: Allergy & Precautions, NPO status , Patient's Chart, lab work & pertinent test results  Airway Mallampati: I  TM Distance: >3 FB Neck ROM: Full    Dental  (+) Edentulous Upper, Edentulous Lower, Dental Advisory Given   Pulmonary    Pulmonary exam normal        Cardiovascular hypertension, Pt. on medications + CAD and + Past MI  Normal cardiovascular exam Rhythm:Regular Rate:Normal     Neuro/Psych CVA    GI/Hepatic GERD  Medicated and Controlled,  Endo/Other  diabetes, Type 2, Insulin Dependent  Renal/GU ESRF and DialysisRenal diseaseLast HD 5/6     Musculoskeletal   Abdominal   Peds  Hematology   Anesthesia Other Findings   Reproductive/Obstetrics                            Anesthesia Physical Anesthesia Plan  ASA: III  Anesthesia Plan: General   Post-op Pain Management:    Induction: Intravenous  PONV Risk Score and Plan: 2  Airway Management Planned: LMA  Additional Equipment:   Intra-op Plan:   Post-operative Plan: Extubation in OR  Informed Consent: I have reviewed the patients History and Physical, chart, labs and discussed the procedure including the risks, benefits and alternatives for the proposed anesthesia with the patient or authorized representative who has indicated his/her understanding and acceptance.     Plan Discussed with: CRNA and Surgeon  Anesthesia Plan Comments:         Anesthesia Quick Evaluation

## 2017-07-15 NOTE — Progress Notes (Signed)
PROGRESS NOTE    Charles Vasquez  TZG:[id number] DOB: 08/27/1935 DOA: 07/11/2017 PCP: Toma Deiters, MD  Brief Narrative:82 y.o.malewith medical history significant ofhypertension, hyperlipidemia, diabetes mellitus, stroke, GERD, ESRD-HD (MWF), CAD,PVD,who presents with right foot wound infection.  Pt recently underwentright lower extremity angiogram withattempted revascularization of his right posterior tibial artery that was unsuccessful 5 weeks ago. Pt has a right foot heel woundinfection, with serosangious drainage and foul smelling odor.Pt was seen by [facility], Dr. Randie Heinz today.Dressing was changed at office.Pt was told that he might have to have an amputation. Pt was sent to ED for admission forantibiotics and pain control. Patient states that he has some moderate of pain, but no fever or chills. He denies chest pain, shortness of breath, cough, nausea, vomiting, diarrhea or abdominal pain no symptoms of UTI or unilateral weakness. Patient states that he had a dialysis on Friday.  ED Course:pt was found to haveWBC 15.6, lactic acid of 1.24, potassium 4.6, bicarbonate 27, creatinine 5.43, BUN 29, temperature 99.8, no tachycardia, no tachypnea, oxygen saturation 98% on room air. Patient is admitted to telemetry bed as inpatient.  5/7-waiting for surgery     Assessment & Plan:   Principal Problem:   Diabetic foot infection (HCC) Active Problems:   Essential hypertension   GERD   ESRD (end stage renal disease) on dialysis (HCC)   CAD (coronary artery disease)   Type II diabetes mellitus with renal manifestations (HCC)   Wound of right foot   Pressure injury of skin 1]diabetic foot wound infection/polimicrobial bacteremia-S/P R BKA 2]type 2 diabetes-CONTINUE insulin.  3]hypertension blood pressure soft hold Cozaar. 4]ESRD dialysis Monday Wednesday Friday.   DVT prophylaxis:heparin Code Status full Family Communication: none Disposition  Plan:.tbd   Consultants:  vascular  Procedures R bka Antimicrobials zosyn Subjective:no new co seen early morning   Objective: Vitals:   07/15/17 1152 07/15/17 1153 07/15/17 1157 07/15/17 1212  BP:  (!) 102/47 (!) 97/49 (!) 104/51  Pulse: 81 81 83 82  Resp: 17 20 17 18   Temp:      TempSrc:      SpO2: 100% 100% 100% 100%  Weight:      Height:        Intake/Output Summary (Last 24 hours) at 07/15/2017 1228 Last data filed at 07/15/2017 0948 Gross per 24 hour  Intake 750 ml  Output 500 ml  Net 250 ml   Filed Weights   07/14/17 0507 07/14/17 0704 07/14/17 1135  Weight: 65.2 kg (143 lb 11.8 oz) 61.4 kg (135 lb 5.8 oz) 58.6 kg (129 lb 3 oz)    Examination:  General exam: Appears calm and comfortable  Respiratory system: Clear to auscultation. Respiratory effort normal. Cardiovascular system: S1 & S2 heard, RRR. No JVD, murmurs, rubs, gallops or clicks. No pedal edema. Gastrointestinal system: Abdomen is nondistended, soft and nontender. No organomegaly or masses felt. Normal bowel sounds heard. Central nervous system: Alert and oriented. No focal neurological deficits. Extremities: Symmetric 5 x 5 power. Skin: No rashes, lesions or ulcers Psychiatry: Judgement and insight appear normal. Mood & affect appropriate.     Data Reviewed: I have personally reviewed following labs and imaging studies  CBC: Recent Labs  Lab 07/11/17 1754 07/13/17 0248 07/15/17 0418 07/15/17 1036  WBC 15.6* 18.5* 16.4*  --   NEUTROABS 13.6* 16.7*  --   --   HGB 10.1* 9.7* 9.0* 7.8*  HCT 33.0* 31.4* 30.1* 23.0*  MCV 86.2 86.0 86.7  --   PLT 202 222  236  --    Basic Metabolic Panel: Recent Labs  Lab 07/11/17 1754 07/13/17 0248 07/15/17 0418 07/15/17 1036  NA 133* 134* 132* 135  K 4.6 5.0 4.7 4.3  CL 92* 94* 93*  --   CO2 27 25 26   --   GLUCOSE 387* 248* 291* 267*  BUN 29* 50* 33*  --   CREATININE 5.43* 8.23* 6.69*  --   CALCIUM 7.5* 7.5* 7.3*  --    GFR: Estimated  Creatinine Clearance: 7.4 mL/min (A) (by C-G formula based on SCr of 6.69 mg/dL (H)). Liver Function Tests: Recent Labs  Lab 07/11/17 1754  AST 26  ALT 24  ALKPHOS 160*  BILITOT 0.5  PROT 7.1  ALBUMIN 2.3*   No results for input(s): LIPASE, AMYLASE in the last 168 hours. No results for input(s): AMMONIA in the last 168 hours. Coagulation Profile: Recent Labs  Lab 07/12/17 0213  INR 1.12   Cardiac Enzymes: No results for input(s): CKTOTAL, CKMB, CKMBINDEX, TROPONINI in the last 168 hours. BNP (last 3 results) No results for input(s): PROBNP in the last 8760 hours. HbA1C: No results for input(s): HGBA1C in the last 72 hours. CBG: Recent Labs  Lab 07/13/17 2130 07/14/17 0614 07/14/17 1223 07/15/17 0750 07/15/17 1009  GLUCAP 311* 87 115* 267* 255*   Lipid Profile: No results for input(s): CHOL, HDL, LDLCALC, TRIG, CHOLHDL, LDLDIRECT in the last 72 hours. Thyroid Function Tests: No results for input(s): TSH, T4TOTAL, FREET4, T3FREE, THYROIDAB in the last 72 hours. Anemia Panel: No results for input(s): VITAMINB12, FOLATE, FERRITIN, TIBC, IRON, RETICCTPCT in the last 72 hours. Sepsis Labs: Recent Labs  Lab 07/11/17 1813 07/12/17 0213 07/12/17 0215  PROCALCITON  --  21.59  --   LATICACIDVEN 1.24  --  1.3    Recent Results (from the past 240 hour(s))  Culture, blood (routine x 2)     Status: Abnormal (Preliminary result)   Collection Time: 07/12/17  3:47 AM  Result Value Ref Range Status   Specimen Description BLOOD LEFT ANTECUBITAL  Final   Special Requests   Final    BOTTLES DRAWN AEROBIC AND ANAEROBIC Blood Culture adequate volume   Culture  Setup Time   Final    GRAM POSITIVE RODS ANAEROBIC BOTTLE ONLY CRITICAL RESULT CALLED TO, READ BACK BY AND VERIFIED WITH: M MACCIA,PHARMD AT 1614 07/12/17 BY L BENFIELD GRAM NEGATIVE RODS GRAM POSITIVE COCCI IN CHAINS IN BOTH AEROBIC AND ANAEROBIC BOTTLES    Culture (A)  Final    PROTEUS SPECIES STREPTOCOCCUS  PARASANGUINIS GRAM POSITIVE RODS CULTURE REINCUBATED FOR BETTER GROWTH Performed at [facility], [address], [place], [place] [address]    Report Status PENDING  Incomplete   Organism ID, Bacteria PROTEUS SPECIES  Final   Organism ID, Bacteria STREPTOCOCCUS PARASANGUINIS  Final      Susceptibility   Streptococcus parasanguinis - MIC*    PENICILLIN 0.12 SENSITIVE Sensitive     CEFTRIAXONE 0.25 SENSITIVE Sensitive     ERYTHROMYCIN <=0.12 SENSITIVE Sensitive     LEVOFLOXACIN 1 SENSITIVE Sensitive     VANCOMYCIN 0.5 SENSITIVE Sensitive     * STREPTOCOCCUS PARASANGUINIS   Proteus species - MIC*    AMPICILLIN >=32 RESISTANT Resistant     CEFAZOLIN >=64 RESISTANT Resistant     CEFEPIME <=1 SENSITIVE Sensitive     CEFTAZIDIME <=1 SENSITIVE Sensitive     CEFTRIAXONE <=1 SENSITIVE Sensitive     CIPROFLOXACIN <=0.25 SENSITIVE Sensitive     GENTAMICIN <=1  SENSITIVE Sensitive     IMIPENEM 8 INTERMEDIATE Intermediate     TRIMETH/SULFA <=20 SENSITIVE Sensitive     AMPICILLIN/SULBACTAM <=2 SENSITIVE Sensitive     PIP/TAZO <=4 SENSITIVE Sensitive     * PROTEUS SPECIES  Blood Culture ID Panel (Reflexed)     Status: Abnormal   Collection Time: 07/12/17  3:47 AM  Result Value Ref Range Status   Enterococcus species NOT DETECTED NOT DETECTED Final   Listeria monocytogenes NOT DETECTED NOT DETECTED Final   Staphylococcus species NOT DETECTED NOT DETECTED Final   Staphylococcus aureus NOT DETECTED NOT DETECTED Final   Streptococcus species DETECTED (A) NOT DETECTED Final    Comment: Not Enterococcus species, Streptococcus agalactiae, Streptococcus pyogenes, or Streptococcus pneumoniae. CRITICAL RESULT CALLED TO, READ BACK BY AND VERIFIED WITH: M MACCIA,PHARMD AT 1614 07/12/17 BY L BENFIELD    Streptococcus agalactiae NOT DETECTED NOT DETECTED Final   Streptococcus pneumoniae NOT DETECTED NOT DETECTED Final   Streptococcus pyogenes NOT DETECTED NOT DETECTED Final   Acinetobacter  baumannii NOT DETECTED NOT DETECTED Final   Enterobacteriaceae species DETECTED (A) NOT DETECTED Final    Comment: Enterobacteriaceae represent a large family of gram-negative bacteria, not a single organism. CRITICAL RESULT CALLED TO, READ BACK BY AND VERIFIED WITH: M MACCIA,PHARMD AT 1614 07/12/17 BY L BENFIELD    Enterobacter cloacae complex NOT DETECTED NOT DETECTED Final   Escherichia coli NOT DETECTED NOT DETECTED Final   Klebsiella oxytoca NOT DETECTED NOT DETECTED Final   Klebsiella pneumoniae NOT DETECTED NOT DETECTED Final   Proteus species DETECTED (A) NOT DETECTED Final    Comment: CRITICAL RESULT CALLED TO, READ BACK BY AND VERIFIED WITH: M MACCIA,PHARMD AT 1614 07/12/17 BY L BENFIELD    Serratia marcescens NOT DETECTED NOT DETECTED Final   Carbapenem resistance NOT DETECTED NOT DETECTED Final   Haemophilus influenzae NOT DETECTED NOT DETECTED Final   Neisseria meningitidis NOT DETECTED NOT DETECTED Final   Pseudomonas aeruginosa NOT DETECTED NOT DETECTED Final   Candida albicans NOT DETECTED NOT DETECTED Final   Candida glabrata NOT DETECTED NOT DETECTED Final   Candida krusei NOT DETECTED NOT DETECTED Final   Candida parapsilosis NOT DETECTED NOT DETECTED Final   Candida tropicalis NOT DETECTED NOT DETECTED Final    Comment: Performed at [facility], [address], [place], [place] [address]  MRSA PCR Screening     Status: None   Collection Time: 07/12/17  5:26 AM  Result Value Ref Range Status   MRSA by PCR NEGATIVE NEGATIVE Final    Comment:        The GeneXpert MRSA Assay (FDA approved for NASAL specimens only), is one component of a comprehensive MRSA colonization surveillance program. It is not intended to diagnose MRSA infection nor to guide or monitor treatment for MRSA infections. Performed at [facility], [address], [place], [place] [address]   Culture, blood (routine x 2)     Status: None (Preliminary result)   Collection Time:  07/12/17  7:58 AM  Result Value Ref Range Status   Specimen Description BLOOD LEFT HAND  Final   Special Requests   Final    BOTTLES DRAWN AEROBIC ONLY Blood Culture results may not be optimal due to an inadequate volume of blood received in culture bottles   Culture   Final    NO GROWTH 2 DAYS Performed at [facility], [address], [place], [place] [address]    Report Status PENDING  Incomplete  Surgical pcr screen     Status: None   Collection Time: 07/15/17 12:16 AM  Result Value Ref Range Status   MRSA, PCR NEGATIVE NEGATIVE Final   Staphylococcus aureus NEGATIVE NEGATIVE Final    Comment: (NOTE) The Xpert SA Assay (FDA approved for NASAL specimens in patients 44 years of age and older), is one component of a comprehensive surveillance program. It is not intended to diagnose infection nor to guide or monitor treatment. Performed at [facility], [address] [place], [place] [address]          Radiology Studies: No results found.      Scheduled Meds: . [MAR Hold] dicyclomine  10 mg Oral BID WC  . [MAR Hold] heparin  5,000 Units Subcutaneous Q8H  . [MAR Hold] insulin aspart  0-9 Units Subcutaneous TID WC  . [MAR Hold] insulin glargine  3 Units Subcutaneous QHS  . [MAR Hold] iron polysaccharides  150 mg Oral BID  . [MAR Hold] losartan  50 mg Oral QPM  . [MAR Hold] multivitamin  1 tablet Oral Daily  . [MAR Hold] pantoprazole  40 mg Oral BID WC  . [MAR Hold] sevelamer carbonate  1,600 mg Oral TID WC  . [MAR Hold] silver sulfADIAZINE  1 application Topical Daily   Continuous Infusions: . sodium chloride 10 mL/hr at 07/15/17 0755  . sodium chloride    . sodium chloride    . albumin human    . [MAR Hold] piperacillin-tazobactam (ZOSYN)  IV 3.375 g (07/14/17 2138)     LOS: 3 days     [name], MD [facility]  If 7PM-7AM, please contact night-coverage [email or web address] Password [facility] 07/15/2017, 12:28 PM

## 2017-07-15 NOTE — Progress Notes (Signed)
Inpatient Diabetes Program Recommendations  AACE/ADA: New Consensus Statement on Inpatient Glycemic Control (2019)  Target Ranges:  Prepandial:   less than 140 mg/dL      Peak postprandial:   less than 180 mg/dL (1-2 hours)      Critically ill patients:  140 - 180 mg/dL  Results for Charles Vasquez, Charles Vasquez (MRN [id number]) as of 07/15/2017 10:19  Ref. Range 07/14/2017 06:14 07/14/2017 12:23 07/15/2017 07:50 07/15/2017 10:09  Glucose-Capillary Latest Ref Range: 65 - 99 mg/dL 87 315 (H) 400 (H) 867 (H)   Results for Charles Vasquez, Charles Vasquez (MRN [id number]) as of 07/14/2017 09:57  Ref. Range 07/13/2017 06:23 07/13/2017 11:49 07/13/2017 16:30 07/13/2017 21:30 07/14/2017 06:14  Glucose-Capillary Latest Ref Range: 65 - 99 mg/dL 712 (H) 458 (H) 099 (H) 311 (H) 87   Review of Glycemic Control  Diabetes history: DM2 Outpatient Diabetes medications: Lantus 5 units QHS Current orders for Inpatient glycemic control: Lantus 3 units QHS, Novolog 0-9 units TID with meals  Inpatient Diabetes Program Recommendations: Insulin - Basal: Please consider increasing Lantus to 4 units QHS. Insulin-Correction: Please consider ordering Novolog 0-5 units QHS for bedtime correction. Insulin - Meal Coverage: Once diet is started, if post prandial glucose consistently greater than 180 mg/dl, please consider ordering Novolog 2 units TID with meals for meal coverage if patient eats at least 50% of meals. A1C: Please consider ordering an A1C to evaluate glycemic control over the past 2-3 months.  Thanks, Orlando Penner, RN, MSN, CDE Diabetes Coordinator Inpatient Diabetes Program [phone] (Team Pager from 8am to 5pm)

## 2017-07-15 NOTE — Progress Notes (Signed)
Dr Imogene Burn notified of hypotension, made aware of Dr Deirdre Priest order to tranfuse 1 unit PRBC, Dr Imogene Burn wants to add additional unit of blood for a total of 2

## 2017-07-15 NOTE — Op Note (Signed)
OPERATIVE NOTE   PROCEDURE: right below-the-knee amputation  PRE-OPERATIVE DIAGNOSIS: right foot gangrene  POST-OPERATIVE DIAGNOSIS: same as above  SURGEON: [name], MD  ASSISTANT(S): [name], PAC   ANESTHESIA: general  ESTIMATED BLOOD LOSS: 500 cc  FINDING(S): 1.  Inadequate control of collateral bleeding despite tourniquet 2.  Viable muscle in right calf  SPECIMEN(S):  right below-the-knee amputation  INDICATIONS:   Charles Vasquez is a 82 y.o. male who presents with right foot gangrene.  The patient is scheduled for a right below-the-knee amputation.  I discussed in depth with the patient the risks, benefits, and alternatives to this procedure.  The patient is aware that the risk of this operation included but are not limited to:  bleeding, infection, myocardial infarction, stroke, death, failure to heal amputation wound, and possible need for more proximal amputation.  The patient is aware of the risks and agrees proceed forward with the procedure.   DESCRIPTION:  After full informed written consent was obtained from the patient, the patient was brought back to the operating room, and placed supine upon the operating table.  Prior to induction, the patient received IV antibiotics.  The patient was then prepped and draped in the standard fashion for a below-the-knee amputation.  I placed a non-sterile tourniquet on the thigh prior to the procedure.  After obtaining adequate anesthesia, the patient was prepped and draped in the standard fashion for a below-the-knee amputation.  I marked out the anterior incision 10 cm distal to the tibial tuberosity and then the marked out a posterior flap that was one third of the circumference   of the calf in length.   I then exsanguinated the leg with a Esmarch bandage and then inflated the tourniquet to 250 mm Hg.   I made the incisions for these flaps, and then dissected through the subcutaneous tissue, fascia, and muscle  anteriorly with electrocautery.  I also similarly developed a thick posterior flap of muscle with electrocautery.  I elevated  the periosteal tissue superiorly so that the tibia was about 3 cm shorter than the anterior skin flap.  I then transected the tibia with a power saw and then took a wedge off the tibia anteriorly with the power saw.  Then I smoothed out the rough edges with a rasp.  In a similar fashion, I cut back the fibula about two centimeters higher than the level of the tibia with a bone cutter.  I then finished releasing the posterior muscle flap with electrocautery.    At this point, the specimen was passed off the field as the below-the-knee amputation.  At this point, I clamped all visibly bleeding arteries and veins using a combination of suture ligation with Vicryl and electrocautery.  The tourniquet was then deflated at this point.  Bleeding continued to be controlled with electrocautery and suture ligature.  The stump was washed off with sterile normal saline and no further active bleeding was noted.    I reapproximated the anterior and posterior fascia  with interrupted stitches of 2-0 Vicryl.  This was completed along the entire length of anterior and posterior fascia until there were no more loose space in the fascial line.  The skin was then reapproximated with staples.  The stump was washed off and dried.    The incision was dressed with [employer] and  then fluffs were applied.  Kerlix was wrapped around the leg and then gently an ACE wrap was applied.     COMPLICATIONS: none  CONDITION: stable   [name], MD, [facility] [place] Office: [phone] Pager: [phone]  07/15/2017, 10:10 AM

## 2017-07-15 NOTE — Telephone Encounter (Signed)
Sched appt 08/13/17 at 3:00. Lm on cell# to inform pt of appt.

## 2017-07-15 NOTE — Telephone Encounter (Signed)
-----   Message from Sharee Pimple, RN sent at 07/15/2017  1:56 PM EDT ----- Regarding: 4 weeks for staple removal, at least 4   ----- Message ----- From: [name], MD Sent: 07/15/2017  10:11 AM To: [facility] Charge [address]  DARROL BABIN [id number] [birth year]  PROCEDURE: right below-the-knee amputation  Asst: [name], [facility]   Follow-up: 4 weeks for staple removal

## 2017-07-15 NOTE — Interval H&P Note (Signed)
   History and Physical Update  The patient was interviewed and re-examined.  The patient's previous History and Physical has been reviewed and is unchanged from Dr. Darcella Cheshire consult  except for: interval angiogram.  This angiogram demonstrated inability to recannulate arterial flow down to foot.  Based on angiographic findings and wound in right foot, Dr. Randie Heinz recommended: Right below-knee amputation.   I discussed in depth the nature of the right below-the-knee amputation with the patient and family, including risks, benefits, and alternatives.    The patient and family are aware that the risks of right below-the-knee amputation include but are not limited to: bleeding, infection, myocardial infarction, stroke, death, failure to heal amputation wound, and possible need for more proximal amputation.    The patient and family are aware that there is nearly a 30-40% conversion to above-knee amputation   The patient and family are aware of the risks and agrees proceed forward with the procedure.   [name], MD, FACS [facility] [place] Office: [phone] Pager: [phone]  07/15/2017, 7:25 AM

## 2017-07-15 NOTE — Anesthesia Postprocedure Evaluation (Signed)
Anesthesia Post Note  Patient: Charles Vasquez  Procedure(s) Performed: AMPUTATION BELOW KNEE RIGHT (Right Leg Lower)     Patient location during evaluation: PACU Anesthesia Type: General Level of consciousness: awake and alert Pain management: pain level controlled Vital Signs Assessment: post-procedure vital signs reviewed and stable Respiratory status: spontaneous breathing, nonlabored ventilation, respiratory function stable and patient connected to nasal cannula oxygen Cardiovascular status: blood pressure returned to baseline and stable Postop Assessment: no apparent nausea or vomiting Anesthetic complications: no    Last Vitals:  Vitals:   07/15/17 1157 07/15/17 1212  BP: (!) 97/49 (!) 104/51  Pulse: 83 82  Resp: 17 18  Temp:    SpO2: 100% 100%    Last Pain:  Vitals:   07/15/17 1215  TempSrc:   PainSc: Asleep                 Leeza Heiner DAVID

## 2017-07-15 NOTE — Progress Notes (Signed)
Patient arrived to PACU with BP consistently 80's/40's. Most recent BP 82/35 Left wrist. IStat Drawn by CRNA at bedside, Hgb 7.8 and Hct 23. Dr Michelle Piper made aware. Ordered 250 albumin and 1 unit PRBC. Will administer and continue to monitor.

## 2017-07-15 NOTE — Progress Notes (Signed)
  [facility] Progress Note    Assessment/ Plan:   82 yo male with ESRD on MWF HD, HTN, HLD, DM, stroke, GERD, CAD, PVD who presented with a R foot infection.  He recently had an angiogram with unsuccessful revascularization of R posterior tibial artery 5 weeks ago and had worsening R foot infection with purulent malodorous drainage, now s/p R BKA.  1. ESRD on MWF HD. Received HD yesterday, next HD tomorrow. Continue home renvela. 2. Diabetic foot wound. On vanc/zosyn per primary. Has polymicrobial bacteremia.  s/p R BKA today per [facility], had blood loss hypotension. . 3. HTN. Holding losartan due to hypotension  4. Anemia.. Hgb: 9.0 > 7.8 compared to baseline 10-11 from acute surgical blood loss. Continue home iron supplementation. Receiving 2U RBC per [facility] 5. Diabetes per primary  Subjective:   Feels somewhat tired after surgery but states otherwise doing well. No CP or SOB.   Objective:   BP (!) 124/55 (BP Location: Left Arm)   Pulse 77   Temp 98 F (36.7 C) (Oral)   Resp 17   Ht 5\' 7"  (1.702 m)   Wt 129 lb 3 oz (58.6 kg)   SpO2 96%   BMI 20.23 kg/m   Intake/Output Summary (Last 24 hours) at 07/15/2017 1433 Last data filed at 07/15/2017 1339 Gross per 24 hour  Intake 1910 ml  Output 500 ml  Net 1410 ml   Weight change: -8 lb 6 oz (-3.8 kg)  Physical Exam: Gen: laying in bed, in NAD, drowsy but easily arousable CVS: regular rate and rhythm, normal S1 and S2, no murmurs Resp:CTAB, normal effort on nasal cannula Abd: soft, nontender, nondistended Ext: R LE s/p BKA with dressing c/d/i  Imaging: No results found.  Labs: BMET Recent Labs  Lab 07/11/17 1754 07/13/17 0248 07/15/17 0418 07/15/17 1036  NA 133* 134* 132* 135  K 4.6 5.0 4.7 4.3  CL 92* 94* 93*  --   CO2 27 25 26   --   GLUCOSE 387* 248* 291* 267*  BUN 29* 50* 33*  --   CREATININE 5.43* 8.23* 6.69*  --   CALCIUM 7.5* 7.5* 7.3*  --    CBC Recent Labs  Lab 07/11/17 1754 07/13/17 0248  07/15/17 0418 07/15/17 1036  WBC 15.6* 18.5* 16.4*  --   NEUTROABS 13.6* 16.7*  --   --   HGB 10.1* 9.7* 9.0* 7.8*  HCT 33.0* 31.4* 30.1* 23.0*  MCV 86.2 86.0 86.7  --   PLT 202 222 236  --     Medications:    . dicyclomine  10 mg Oral BID WC  . heparin  5,000 Units Subcutaneous Q8H  . insulin aspart  0-9 Units Subcutaneous TID WC  . insulin glargine  3 Units Subcutaneous QHS  . iron polysaccharides  150 mg Oral BID  . multivitamin  1 tablet Oral Daily  . pantoprazole  40 mg Oral BID WC  . sevelamer carbonate  1,600 mg Oral TID WC      Leland Her, DO PGY-2, [facility] 07/15/2017 2:33 PM

## 2017-07-15 NOTE — Progress Notes (Signed)
   Daily Progress Note  CBC Latest Ref Rng & Units 07/15/2017 07/15/2017 07/13/2017  WBC 4.0 - 10.5 K/uL - 16.4(H) 18.5(H)  Hemoglobin 13.0 - 17.0 g/dL 7.8(L) 9.0(L) 9.7(L)  Hematocrit 39.0 - 52.0 % 23.0(L) 30.1(L) 31.4(L)  Platelets 150 - 400 K/uL - 236 222   - pt hypotensive so will give 2 u pRBC to replace blood loss from R BKA   [name], MD, FACS [facility] [place] Office: [phone] Pager: [phone]  07/15/2017, 11:24 AM

## 2017-07-15 NOTE — Anesthesia Procedure Notes (Signed)
Procedure Name: LMA Insertion Date/Time: 07/15/2017 8:19 AM Performed by: Lovie Chol, CRNA Pre-anesthesia Checklist: Patient identified, Emergency Drugs available, Suction available and Patient being monitored Patient Re-evaluated:Patient Re-evaluated prior to induction Oxygen Delivery Method: Circle System Utilized Preoxygenation: Pre-oxygenation with 100% oxygen Induction Type: IV induction Ventilation: Mask ventilation without difficulty LMA: LMA inserted LMA Size: 4.0 Number of attempts: 1 Airway Equipment and Method: Bite block Placement Confirmation: positive ETCO2 and breath sounds checked- equal and bilateral Tube secured with: Tape Dental Injury: Teeth and Oropharynx as per pre-operative assessment

## 2017-07-16 ENCOUNTER — Inpatient Hospital Stay (HOSPITAL_COMMUNITY): Payer: Medicare Other

## 2017-07-16 ENCOUNTER — Encounter (HOSPITAL_COMMUNITY): Payer: Self-pay | Admitting: Vascular Surgery

## 2017-07-16 DIAGNOSIS — E1121 Type 2 diabetes mellitus with diabetic nephropathy: Secondary | ICD-10-CM

## 2017-07-16 DIAGNOSIS — N186 End stage renal disease: Secondary | ICD-10-CM

## 2017-07-16 DIAGNOSIS — S88111A Complete traumatic amputation at level between knee and ankle, right lower leg, initial encounter: Secondary | ICD-10-CM

## 2017-07-16 DIAGNOSIS — Z992 Dependence on renal dialysis: Secondary | ICD-10-CM

## 2017-07-16 DIAGNOSIS — S91301A Unspecified open wound, right foot, initial encounter: Secondary | ICD-10-CM

## 2017-07-16 DIAGNOSIS — E11628 Type 2 diabetes mellitus with other skin complications: Secondary | ICD-10-CM

## 2017-07-16 DIAGNOSIS — L089 Local infection of the skin and subcutaneous tissue, unspecified: Secondary | ICD-10-CM

## 2017-07-16 LAB — BPAM RBC
Blood Product Expiration Date: 201906012359
Blood Product Expiration Date: 201906012359
ISSUE DATE / TIME: 201905071242
ISSUE DATE / TIME: 201905071242
UNIT TYPE AND RH: 5100
Unit Type and Rh: 5100

## 2017-07-16 LAB — TYPE AND SCREEN
ABO/RH(D): O POS
Antibody Screen: NEGATIVE
UNIT DIVISION: 0
Unit division: 0

## 2017-07-16 LAB — GLUCOSE, CAPILLARY
GLUCOSE-CAPILLARY: 221 mg/dL — AB (ref 65–99)
GLUCOSE-CAPILLARY: 199 mg/dL — AB (ref 65–99)
GLUCOSE-CAPILLARY: 285 mg/dL — AB (ref 65–99)
GLUCOSE-CAPILLARY: 329 mg/dL — AB (ref 65–99)
GLUCOSE-CAPILLARY: 229 mg/dL — AB (ref 65–99)
Glucose-Capillary: 94 mg/dL (ref 65–99)
Glucose-Capillary: 177 mg/dL — ABNORMAL HIGH (ref 65–99)

## 2017-07-16 LAB — BASIC METABOLIC PANEL
ANION GAP: 13 (ref 5–15)
BUN: 47 mg/dL — ABNORMAL HIGH (ref 6–20)
CALCIUM: 6.9 mg/dL — AB (ref 8.9–10.3)
CHLORIDE: 98 mmol/L — AB (ref 101–111)
CO2: 22 mmol/L (ref 22–32)
Creatinine, Ser: 8.31 mg/dL — ABNORMAL HIGH (ref 0.61–1.24)
GFR calc Af Amer: 6 mL/min — ABNORMAL LOW (ref >60)
GFR calc non Af Amer: 5 mL/min — ABNORMAL LOW (ref >60)
GLUCOSE: 226 mg/dL — AB (ref 65–99)
Potassium: 5.1 mmol/L (ref 3.5–5.1)
Sodium: 133 mmol/L — ABNORMAL LOW (ref 135–145)

## 2017-07-16 LAB — CBC
HEMATOCRIT: 29.8 % — AB (ref 39.0–52.0)
HEMOGLOBIN: 9.4 g/dL — AB (ref 13.0–17.0)
MCH: 27.1 pg (ref 26.0–34.0)
MCHC: 31.5 g/dL (ref 30.0–36.0)
MCV: 85.9 fL (ref 78.0–100.0)
Platelets: 184 10*3/uL (ref 150–400)
RBC: 3.47 MIL/uL — ABNORMAL LOW (ref 4.22–5.81)
RDW: 15.6 % — ABNORMAL HIGH (ref 11.5–15.5)
WBC: 12.6 10*3/uL — ABNORMAL HIGH (ref 4.0–10.5)

## 2017-07-16 MED ORDER — INSULIN GLARGINE 100 UNIT/ML ~~LOC~~ SOLN
8.0000 [IU] | Freq: Every day | SUBCUTANEOUS | Status: DC
  Administered 2017-07-16: 8 [IU] via SUBCUTANEOUS
  Filled 2017-07-16 (×2): qty 0.08

## 2017-07-16 MED ORDER — SODIUM CHLORIDE 0.9 % IV SOLN
2.0000 g | INTRAVENOUS | Status: DC
  Administered 2017-07-16 – 2017-07-17 (×2): 2 g via INTRAVENOUS
  Filled 2017-07-16 (×2): qty 20

## 2017-07-16 NOTE — Progress Notes (Signed)
OT Cancellation Note  Patient Details Name: Charles Vasquez MRN: [id number] DOB: 1935-10-26   Cancelled Treatment:    Reason Eval/Treat Not Completed: Patient at procedure or test/ unavailable(HD)  [name] 07/16/2017, 8:32 AM  Sherryl Manges OTR/L [phone]

## 2017-07-16 NOTE — Progress Notes (Signed)
  [facility] Progress Note    Assessment/ Plan:   82 yo male with ESRD on MWF HD, HTN, HLD, DM, stroke, GERD, CAD, PVD who presented with a R foot infection.  He recently had an angiogram with unsuccessful revascularization of R posterior tibial artery 5 weeks ago and had worsening R foot infection with purulent malodorous drainage, now s/p R BKA.   1. ESRD on MWF HD. HD today. Continue home renvela. K 5.1 2. 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 On vanc/zosyn per primary for polymicrobial bacteremia.   3. HTN. Holding losartan due to soft BPs  4. Anemia. Hgb: 9.0 > 7.8 from surgical blood loss now s/p 2 U RBC > 9.4 compared to baseline 10-11. Continue home iron supplementation.  5. Diabetes per primary  Subjective:   States that feels well and that pain is well controlled. No SOB.   Objective:   BP (!) 132/52 (BP Location: Left Arm)   Pulse 72   Temp 98.4 F (36.9 C) (Oral)   Resp 18   Ht 5\' 7"  (1.702 m)   Wt 126 lb 8.7 oz (57.4 kg)   SpO2 97%   BMI 19.82 kg/m   Intake/Output Summary (Last 24 hours) at 07/16/2017 0754 Last data filed at 07/15/2017 1339 Gross per 24 hour  Intake 1910 ml  Output 500 ml  Net 1410 ml   Weight change: -8 lb 13.1 oz (-4 kg)  Physical Exam: Gen: laying in bed, in NAD, awake and alert CVS: regular rate and rhythm, normal S1 and S2, no murmurs Resp:CTAB, normal effort on room air Abd: soft, nontender, nondistended Ext: R LE s/p BKA with dressing c/d/i, nontender to palpation  Imaging: No results found.  Labs: BMET Recent Labs  Lab 07/11/17 1754 07/13/17 0248 07/15/17 0418 07/15/17 1036 07/16/17 0351  NA 133* 134* 132* 135 133*  K 4.6 5.0 4.7 4.3 5.1  CL 92* 94* 93*  --  98*  CO2 27 25 26   --  22  GLUCOSE 387* 248* 291* 267* 226*  BUN 29* 50* 33*  --  47*  CREATININE 5.43* 8.23* 6.69*  --  8.31*  CALCIUM 7.5* 7.5* 7.3*  --  6.9*   CBC Recent Labs  Lab 07/11/17 1754 07/13/17 0248 07/15/17 0418 07/15/17 1036  07/15/17 1620 07/16/17 0351  WBC 15.6* 18.5* 16.4*  --   --  12.6*  NEUTROABS 13.6* 16.7*  --   --   --   --   HGB 10.1* 9.7* 9.0* 7.8* 8.9* 9.4*  HCT 33.0* 31.4* 30.1* 23.0* 28.9* 29.8*  MCV 86.2 86.0 86.7  --   --  85.9  PLT 202 222 236  --   --  184    Medications:    . dicyclomine  10 mg Oral BID WC  . heparin  5,000 Units Subcutaneous Q8H  . insulin aspart  0-9 Units Subcutaneous TID WC  . insulin glargine  3 Units Subcutaneous QHS  . iron polysaccharides  150 mg Oral BID  . multivitamin  1 tablet Oral Daily  . pantoprazole  40 mg Oral BID WC  . sevelamer carbonate  1,600 mg Oral TID WC      Leland Her, DO PGY-2, [facility] 07/16/2017 7:54 AM

## 2017-07-16 NOTE — Progress Notes (Addendum)
PROGRESS NOTE    [name]  SWF:[id number] DOB: February 08, 1936 DOA: 07/11/2017 PCP: Toma Deiters, MD  Brief Narrative::82 y.o.malewith medical history significant ofhypertension, hyperlipidemia, diabetes mellitus, stroke, GERD, ESRD-HD (MWF), CAD,PVD,who presents with right foot wound infection.  Pt recently underwentright lower extremity angiogram withattempted revascularization of his right posterior tibial artery that was unsuccessful 5 weeks ago. Pt has a right foot heel woundinfection, with serosangious drainage and foul smelling odor.Pt was seen by [facility], Dr. Randie Heinz today.Dressing was changed at office.Pt was told that he might have to have an amputation. Pt was sent to ED for admission forantibiotics and pain control. Patient states that he has some moderate of pain, but no fever or chills. He denies chest pain, shortness of breath, cough, nausea, vomiting, diarrhea or abdominal pain no symptoms of UTI or unilateral weakness. Patient states that he had a dialysis on Friday.  ED Course:pt was found to haveWBC 15.6, lactic acid of 1.24, potassium 4.6, bicarbonate 27, creatinine 5.43, BUN 29, temperature 99.8, no tachycardia, no tachypnea, oxygen saturation 98% on room air. Patient is admitted to telemetry bed as inpatient.     Assessment & Plan:   Principal Problem:   Diabetic foot infection (HCC) Active Problems:   Essential hypertension   GERD   ESRD (end stage renal disease) on dialysis (HCC)   CAD (coronary artery disease)   Type II diabetes mellitus with renal manifestations (HCC)   Wound of right foot   Pressure injury of skin  1]diabetic foot wound infection/polimicrobial bacteremia-S/P R BKA.further work up per vascular. 2]type 2 diabetes-increase lantus. 3]hypertension blood pressure soft hold Cozaar. 4]ESRD dialysis Monday Wednesday Friday. 5]post op anemia s/p 2 units prbc. DVT prophylaxis:heparin Code Status:full Family  Communication:none Disposition Plan:tbd Consultants:  vascular Procedures:r bka Antimicrobials:  zosyn Subjective:seen at hd.diastolic bp low.   Objective: Vitals:   07/16/17 0830 07/16/17 0900 07/16/17 0930 07/16/17 1000  BP: (!) 112/24 (!) 115/22 (!) 110/24 (!) 133/22  Pulse: 72 73 72 72  Resp: 14 14 16 15   Temp:      TempSrc:      SpO2:      Weight:      Height:        Intake/Output Summary (Last 24 hours) at 07/16/2017 1009 Last data filed at 07/15/2017 1339 Gross per 24 hour  Intake 1160 ml  Output -  Net 1160 ml   Filed Weights   07/14/17 1135 07/16/17 0307 07/16/17 0728  Weight: 58.6 kg (129 lb 3 oz) 57.4 kg (126 lb 8.7 oz) 57.4 kg (126 lb 8.7 oz)    Examination:  General exam: Appears calm and comfortable ,sleepy Respiratory system: Clear to auscultation. Respiratory effort normal. Cardiovascular system: S1 & S2 heard, RRR. No JVD, murmurs, rubs, gallops or clicks. No pedal edema. Gastrointestinal system: Abdomen is nondistended, soft and nontender. No organomegaly or masses felt. Normal bowel sounds heard. Central nervous system: Alert and oriented. No focal neurological deficits. Extremities: stump covered with dressings. Skin: No rashes, lesions or ulcers Psychiatry: Judgement and insight appear normal. Mood & affect appropriate.     Data Reviewed: I have personally reviewed following labs and imaging studies  CBC: Recent Labs  Lab 07/11/17 1754 07/13/17 0248 07/15/17 0418 07/15/17 1036 07/15/17 1620 07/16/17 0351  WBC 15.6* 18.5* 16.4*  --   --  12.6*  NEUTROABS 13.6* 16.7*  --   --   --   --   HGB 10.1* 9.7* 9.0* 7.8* 8.9* 9.4*  HCT 33.0* 31.4*  30.1* 23.0* 28.9* 29.8*  MCV 86.2 86.0 86.7  --   --  85.9  PLT 202 222 236  --   --  184   Basic Metabolic Panel: Recent Labs  Lab 07/11/17 1754 07/13/17 0248 07/15/17 0418 07/15/17 1036 07/16/17 0351  NA 133* 134* 132* 135 133*  K 4.6 5.0 4.7 4.3 5.1  CL 92* 94* 93*  --  98*  CO2 27 25 26    --  22  GLUCOSE 387* 248* 291* 267* 226*  BUN 29* 50* 33*  --  47*  CREATININE 5.43* 8.23* 6.69*  --  8.31*  CALCIUM 7.5* 7.5* 7.3*  --  6.9*   GFR: Estimated Creatinine Clearance: 5.9 mL/min (A) (by C-G formula based on SCr of 8.31 mg/dL (H)). Liver Function Tests: Recent Labs  Lab 07/11/17 1754  AST 26  ALT 24  ALKPHOS 160*  BILITOT 0.5  PROT 7.1  ALBUMIN 2.3*   No results for input(s): LIPASE, AMYLASE in the last 168 hours. No results for input(s): AMMONIA in the last 168 hours. Coagulation Profile: Recent Labs  Lab 07/12/17 0213  INR 1.12   Cardiac Enzymes: No results for input(s): CKTOTAL, CKMB, CKMBINDEX, TROPONINI in the last 168 hours. BNP (last 3 results) No results for input(s): PROBNP in the last 8760 hours. HbA1C: No results for input(s): HGBA1C in the last 72 hours. CBG: Recent Labs  Lab 07/15/17 0750 07/15/17 1009 07/15/17 1640 07/15/17 2117 07/16/17 0627  GLUCAP 267* 255* 329* 222* 229*   Lipid Profile: No results for input(s): CHOL, HDL, LDLCALC, TRIG, CHOLHDL, LDLDIRECT in the last 72 hours. Thyroid Function Tests: No results for input(s): TSH, T4TOTAL, FREET4, T3FREE, THYROIDAB in the last 72 hours. Anemia Panel: No results for input(s): VITAMINB12, FOLATE, FERRITIN, TIBC, IRON, RETICCTPCT in the last 72 hours. Sepsis Labs: Recent Labs  Lab 07/11/17 1813 07/12/17 0213 07/12/17 0215  PROCALCITON  --  21.59  --   LATICACIDVEN 1.24  --  1.3    Recent Results (from the past 240 hour(s))  Culture, blood (routine x 2)     Status: Abnormal (Preliminary result)   Collection Time: 07/12/17  3:47 AM  Result Value Ref Range Status   Specimen Description BLOOD LEFT ANTECUBITAL  Final   Special Requests   Final    BOTTLES DRAWN AEROBIC AND ANAEROBIC Blood Culture adequate volume   Culture  Setup Time   Final    GRAM POSITIVE RODS ANAEROBIC BOTTLE ONLY CRITICAL RESULT CALLED TO, READ BACK BY AND VERIFIED WITH: M MACCIA,PHARMD AT 1614 07/12/17 BY  L BENFIELD GRAM NEGATIVE RODS GRAM POSITIVE COCCI IN CHAINS IN BOTH AEROBIC AND ANAEROBIC BOTTLES    Culture (A)  Final    PROTEUS SPECIES STREPTOCOCCUS PARASANGUINIS GRAM POSITIVE RODS CULTURE REINCUBATED FOR BETTER GROWTH Performed at [facility], [address], [place], [place] [address]    Report Status PENDING  Incomplete   Organism ID, Bacteria PROTEUS SPECIES  Final   Organism ID, Bacteria STREPTOCOCCUS PARASANGUINIS  Final      Susceptibility   Streptococcus parasanguinis - MIC*    PENICILLIN 0.12 SENSITIVE Sensitive     CEFTRIAXONE 0.25 SENSITIVE Sensitive     ERYTHROMYCIN <=0.12 SENSITIVE Sensitive     LEVOFLOXACIN 1 SENSITIVE Sensitive     VANCOMYCIN 0.5 SENSITIVE Sensitive     * STREPTOCOCCUS PARASANGUINIS   Proteus species - MIC*    AMPICILLIN >=32 RESISTANT Resistant     CEFAZOLIN >=64 RESISTANT Resistant  CEFEPIME <=1 SENSITIVE Sensitive     CEFTAZIDIME <=1 SENSITIVE Sensitive     CEFTRIAXONE <=1 SENSITIVE Sensitive     CIPROFLOXACIN <=0.25 SENSITIVE Sensitive     GENTAMICIN <=1 SENSITIVE Sensitive     IMIPENEM 8 INTERMEDIATE Intermediate     TRIMETH/SULFA <=20 SENSITIVE Sensitive     AMPICILLIN/SULBACTAM <=2 SENSITIVE Sensitive     PIP/TAZO <=4 SENSITIVE Sensitive     * PROTEUS SPECIES  Blood Culture ID Panel (Reflexed)     Status: Abnormal   Collection Time: 07/12/17  3:47 AM  Result Value Ref Range Status   Enterococcus species NOT DETECTED NOT DETECTED Final   Listeria monocytogenes NOT DETECTED NOT DETECTED Final   Staphylococcus species NOT DETECTED NOT DETECTED Final   Staphylococcus aureus NOT DETECTED NOT DETECTED Final   Streptococcus species DETECTED (A) NOT DETECTED Final    Comment: Not Enterococcus species, Streptococcus agalactiae, Streptococcus pyogenes, or Streptococcus pneumoniae. CRITICAL RESULT CALLED TO, READ BACK BY AND VERIFIED WITH: M MACCIA,PHARMD AT 1614 07/12/17 BY L BENFIELD    Streptococcus agalactiae NOT DETECTED  NOT DETECTED Final   Streptococcus pneumoniae NOT DETECTED NOT DETECTED Final   Streptococcus pyogenes NOT DETECTED NOT DETECTED Final   Acinetobacter baumannii NOT DETECTED NOT DETECTED Final   Enterobacteriaceae species DETECTED (A) NOT DETECTED Final    Comment: Enterobacteriaceae represent a large family of gram-negative bacteria, not a single organism. CRITICAL RESULT CALLED TO, READ BACK BY AND VERIFIED WITH: M MACCIA,PHARMD AT 1614 07/12/17 BY L BENFIELD    Enterobacter cloacae complex NOT DETECTED NOT DETECTED Final   Escherichia coli NOT DETECTED NOT DETECTED Final   Klebsiella oxytoca NOT DETECTED NOT DETECTED Final   Klebsiella pneumoniae NOT DETECTED NOT DETECTED Final   Proteus species DETECTED (A) NOT DETECTED Final    Comment: CRITICAL RESULT CALLED TO, READ BACK BY AND VERIFIED WITH: M MACCIA,PHARMD AT 1614 07/12/17 BY L BENFIELD    Serratia marcescens NOT DETECTED NOT DETECTED Final   Carbapenem resistance NOT DETECTED NOT DETECTED Final   Haemophilus influenzae NOT DETECTED NOT DETECTED Final   Neisseria meningitidis NOT DETECTED NOT DETECTED Final   Pseudomonas aeruginosa NOT DETECTED NOT DETECTED Final   Candida albicans NOT DETECTED NOT DETECTED Final   Candida glabrata NOT DETECTED NOT DETECTED Final   Candida krusei NOT DETECTED NOT DETECTED Final   Candida parapsilosis NOT DETECTED NOT DETECTED Final   Candida tropicalis NOT DETECTED NOT DETECTED Final    Comment: Performed at [facility], [address], [place], [place] [address]  MRSA PCR Screening     Status: None   Collection Time: 07/12/17  5:26 AM  Result Value Ref Range Status   MRSA by PCR NEGATIVE NEGATIVE Final    Comment:        The GeneXpert MRSA Assay (FDA approved for NASAL specimens only), is one component of a comprehensive MRSA colonization surveillance program. It is not intended to diagnose MRSA infection nor to guide or monitor treatment for MRSA infections. Performed at [facility], [address], [place], [place] [address]   Culture, blood (routine x 2)     Status: None (Preliminary result)   Collection Time: 07/12/17  7:58 AM  Result Value Ref Range Status   Specimen Description BLOOD LEFT HAND  Final   Special Requests   Final    BOTTLES DRAWN AEROBIC ONLY Blood Culture results may not be optimal due to an inadequate volume of blood received in culture bottles  Culture   Final    NO GROWTH 4 DAYS Performed at [facility], [address], [place], [place] [address]    Report Status PENDING  Incomplete  Surgical pcr screen     Status: None   Collection Time: 07/15/17 12:16 AM  Result Value Ref Range Status   MRSA, PCR NEGATIVE NEGATIVE Final   Staphylococcus aureus NEGATIVE NEGATIVE Final    Comment: (NOTE) The Xpert SA Assay (FDA approved for NASAL specimens in patients 67 years of age and older), is one component of a comprehensive surveillance program. It is not intended to diagnose infection nor to guide or monitor treatment. Performed at [facility], [address], [place], [place] [address]          Radiology Studies: No results found.      Scheduled Meds: . dicyclomine  10 mg Oral BID WC  . heparin  5,000 Units Subcutaneous Q8H  . insulin aspart  0-9 Units Subcutaneous TID WC  . insulin glargine  3 Units Subcutaneous QHS  . iron polysaccharides  150 mg Oral BID  . multivitamin  1 tablet Oral Daily  . pantoprazole  40 mg Oral BID WC  . sevelamer carbonate  1,600 mg Oral TID WC   Continuous Infusions: . sodium chloride 10 mL/hr at 07/15/17 0755  . sodium chloride    . piperacillin-tazobactam (ZOSYN)  IV Stopped (07/16/17 0130)     LOS: 4 days     [name], MD [facility]  If 7PM-7AM, please contact night-coverage [email or web address] Password [email or web address] 07/16/2017, 10:09 AM

## 2017-07-16 NOTE — Progress Notes (Signed)
Left lower extremity arterial duplex completed. Preliminary results. There is mild plaque noted throughout with no evidence of a significant stenosis. [employer], RVS 07/16/2017 6:15 PM

## 2017-07-16 NOTE — Consult Note (Signed)
Physical Medicine and Rehabilitation Consult Reason for Consult: Decreased functional mobility Referring Physician: [facility]   HPI: Charles Vasquez is a 82 y.o. right-handed male with history of hypertension, end-stage renal disease with hemodialysis Monday Wednesday Friday at [facility], diabetes mellitus.  Per chart review patient lives alone.  He has a girlfriend that checks on him daily and a sister in the area.  Question assistance on discharge.  One level home.  Presented 07/12/2017 with chronic right wound infection and progressive ischemic changes.  Patient with recent right lower extremity angiogram with attempted revascularization of right posterior tibial artery that was unsuccessful 5 weeks ago.  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.  Hospital course pain management.  Hemodialysis ongoing as per renal services.  Subcutaneous heparin for DVT prophylaxis.  Acute on chronic anemia 9.4.  Physical and occupational therapy evaluations pending.  MD has requested physical medicine rehab consult.  Patient is asking why his right lower extremity is so stiff.  Reminded him that he had surgery yesterday.  Received tramadol this morning for pain.  Just got taken off of dialysis.  Has a femoral AV graft on the left side  Review of Systems  Constitutional: Negative for chills and fever.  HENT: Negative for hearing loss.   Respiratory: Negative for cough.        Occasional shortness of breath with exertion  Cardiovascular: Positive for leg swelling. Negative for chest pain and palpitations.  Gastrointestinal: Positive for constipation. Negative for nausea and vomiting.  Genitourinary: Negative for hematuria.  Musculoskeletal: Positive for joint pain and myalgias.  Skin: Negative for rash.  All other systems reviewed and are negative.  Past Medical History:  Diagnosis Date  . Anal pain   . AVF (arteriovenous fistula)  (HCC) 05-13-2017 per pt currently AVF access used is left thigh   hx multiple AVF surgery's (previously left upper arm, bilateral thigh) last surgery -- right upper arm creation 11-26-2016  . ESRD (end stage renal disease) on dialysis [facility])    [facility] in [place], [place] on MWF   . History of CVA (cerebrovascular accident)    05-13-2017  per pt stated "thats what they told yrs ago"  per pt no residual  . Hyperlipidemia   . Hypertension   . Myocardial infarction (HCC)   . Stroke (HCC)   . Type 2 diabetes mellitus treated with insulin [facility])    Past Surgical History:  Procedure Laterality Date  . ABDOMINAL AORTOGRAM W/LOWER EXTREMITY N/A 06/25/2017   Procedure: ABDOMINAL AORTOGRAM W/LOWER EXTREMITY;  Surgeon: Maeola Harman, MD;  Location: [facility] INVASIVE CV LAB;  Service: Cardiovascular;  Laterality: N/A;  . AMPUTATION Right 01/01/2017   Procedure: REVISION AMPUTATION RIGHT INDEX FINGER;  Surgeon: Dairl Ponder, MD;  Location: [facility] OR;  Service: Orthopedics;  Laterality: Right;  . ARTERIOVENOUS GRAFT PLACEMENT    . ARTERIOVENOUS GRAFT PLACEMENT Left 10/22/2016   thigh  . AV FISTULA PLACEMENT  03/31/2012   Procedure: ARTERIOVENOUS (AV) FISTULA CREATION;  Surgeon: [name], MD;  [facility];  Service: Vascular;  Laterality: Right;  First stage Brachial vein transposition  . AV FISTULA PLACEMENT Right 08/25/2012   Procedure: INSERTION OF ARTERIOVENOUS (AV) GORE-TEX GRAFT ARM;  Surgeon: [name], MD;  Location: [facility] OR;  Service: Vascular;  Laterality: Right;  . AV FISTULA PLACEMENT Left 07/23/2016   Procedure: INSERTION OF ARTERIOVENOUS (AV) GORE-TEX GRAFT LEFT  UPPER ARM;  Surgeon: [name], MD;  Location: [facility] OR;  Service: Vascular;  Laterality: Left;  . AV FISTULA PLACEMENT Left 10/22/2016   Procedure: INSERTION OF 4-35mm x 45cm  ARTERIOVENOUS (AV) GORE-TEX GRAFT THIGH-LEFT;  Surgeon: [name], MD;  Location: [facility] OR;  Service: Vascular;  Laterality: Left;  .  CATARACT EXTRACTION W/ INTRAOCULAR LENS  IMPLANT, BILATERAL    . COLONOSCOPY N/A 03/25/2017   Dr. Darrick Penna: examined portion of ileum normal. Redundant left colon, stricture at anus   . ESOPHAGOGASTRODUODENOSCOPY N/A 03/25/2017   Dr. Darrick Penna: widely patent Schatzki ring at GE junction s/p dilation, atrophic gastritis, single duodenal AVM, non-bleeding  . INSERTION OF DIALYSIS CATHETER Left 05/05/2012   Procedure: INSERTION OF DIALYSIS CATHETER;  Surgeon: Chuck Hint, MD;  Location: [facility] OR;  Service: Vascular;  Laterality: Left;  . INSERTION OF DIALYSIS CATHETER Right 10/01/2016   Procedure: INSERTION OF DIALYSIS CATHETER- RIGHT FEMORAL;  Surgeon: [name], MD;  Location: [facility] OR;  Service: Vascular;  Laterality: Right;  . IR FLUORO GUIDE CV LINE RIGHT  06/10/2016  . IR GENERIC HISTORICAL  06/07/2016   IR US GUIDE VASC ACCESS RIGHT 06/07/2016 Oley Balm, MD [facility]-INTERV RAD  . IR THROMBECTOMY AV FISTULA W/THROMBOLYSIS/PTA INC/SHUNT/IMG RIGHT Right 06/07/2016  . IR US GUIDE VASC ACCESS RIGHT  06/10/2016  . LIGATION ARTERIOVENOUS GORTEX GRAFT Left 08/04/2016   Procedure: LIGATION ARTERIOVENOUS GORTEX GRAFT;  Surgeon: Larina Earthly, MD;  Location: [facility] OR;  Service: Vascular;  Laterality: Left;  . LIGATION ARTERIOVENOUS GORTEX GRAFT Right 11/26/2016   Procedure: LIGATION OF RIGHT UPPER ARM ARTERIOVENOUS GORTEX GRAFT;  Surgeon: [name], MD;  Location: [facility] OR;  Service: Vascular;  Laterality: Right;  . LIGATION OF ARTERIOVENOUS  FISTULA Right 08/25/2012   Procedure: LIGATION OF ARTERIOVENOUS  FISTULA;  Surgeon: [name], MD;  Location: [facility] OR;  Service: Vascular;  Laterality: Right;  Ligation of right brachial vein transposition  . PERIPHERAL VASCULAR ATHERECTOMY  06/25/2017   Procedure: PERIPHERAL VASCULAR ATHERECTOMY;  Surgeon: Maeola Harman, MD;  Location: [facility] INVASIVE CV LAB;  Service: Cardiovascular;;  Rt. PT  . REMOVAL OF A DIALYSIS CATHETER Right 05/05/2012   Procedure: REMOVAL OF  A DIALYSIS CATHETER;  Surgeon: Chuck Hint, MD;  Location: [facility] OR;  Service: Vascular;  Laterality: Right;  . REMOVAL OF A DIALYSIS CATHETER Right 10/01/2016   Procedure: REMOVAL OF A DIALYSIS CATHETER-RIGHT UPPER CHEST;  Surgeon: [name], MD;  Location: [facility] OR;  Service: Vascular;  Laterality: Right;  . REMOVAL OF A DIALYSIS CATHETER Right 11/26/2016   Procedure: REMOVAL OF RIGHT FEMORAL TUNNEL DIALYSIS CATHETER;  Surgeon: [name], MD;  Location: [facility] OR;  Service: Vascular;  Laterality: Right;  . SPHINCTEROTOMY N/A 05/15/2017   Procedure: POSSIBLE SPHINCTEROTOMY (BOTOX);  Surgeon: Romie Levee, MD;  Location: [facility];  Service: General;  Laterality: N/A;  . TOE AMPUTATION  02/2007   left foot first 3 toes  . UPPER EXTREMITY VENOGRAPHY Bilateral 07/18/2016   Procedure: Bilateral Upper Extremity Venography;  Surgeon: [name], MD;  Location: [facility] INVASIVE CV LAB;  Service: Cardiovascular;  Laterality: Bilateral;   Family History  Problem Relation Age of Onset  . Diabetes Mother   . Hypertension Mother   . AAA (abdominal aortic aneurysm) Mother   . Diabetes Father   . Hypertension Father    Social History:  reports that he has never smoked. He has never used smokeless tobacco. He reports  that he does not drink alcohol or use drugs. Allergies:  Allergies  Allergen Reactions  . Lisinopril Other (See Comments)    HYPERKALEMIA RENAL DYSFUNCTION PROGRESSION   Medications Prior to Admission  Medication Sig Dispense Refill  . calcium carbonate (TUMS - DOSED IN MG ELEMENTAL CALCIUM) 500 MG chewable tablet Chew 1 tablet by mouth daily as needed for indigestion or heartburn.    . carboxymethylcellulose (REFRESH PLUS) 0.5 % SOLN Place 1 drop into both eyes 3 (three) times daily as needed (dry eyes). AS DIRECTED     . dicyclomine (BENTYL) 10 MG capsule 1 PO 30 MINUTES PRIOR TO BREAKFAST AND LUNCH (Patient taking differently: Take 10 mg by mouth 2 (two) times  daily. ) 62 capsule 11  . insulin glargine (LANTUS) 100 UNIT/ML injection Inject 5 Units into the skin at bedtime.     Marland Kitchen losartan (COZAAR) 50 MG tablet Take 50 mg by mouth every evening.     . multivitamin (RENA-VIT) TABS tablet Take 1 tablet by mouth daily.    . pantoprazole (PROTONIX) 40 MG tablet 1 PO 30 MINUTES PRIOR TO MEALS BID FOR 3 MOS THEN QD (Patient taking differently: Take 40 mg by mouth 2 (two) times daily. ) 60 tablet 11  . Polysacch Fe Cmp-Fe Heme Poly (BIFERA) 28 MG TABS 1 po bid (Patient taking differently: Take 28 mg by mouth 2 (two) times daily. 1 po bid) 60 tablet 11  . sevelamer carbonate (RENVELA) 800 MG tablet Take 1,600 mg by mouth 3 (three) times daily with meals. Supposed to take one 800 mg with snacks and caregiver said he only does that sometimes    . SSD 1 % cream Apply 1 application topically daily.    . traMADol (ULTRAM) 50 MG tablet Take 1 tablet (50 mg total) by mouth every 6 (six) hours as needed. 30 tablet 0    Home: Home Living Family/patient expects to be discharged to:: Private residence Living Arrangements: Alone  Functional History:   Functional Status:  Mobility:          ADL:    Cognition: Cognition Orientation Level: Oriented X4    Blood pressure (!) 132/52, pulse 72, temperature 98.4 F (36.9 C), temperature source Oral, resp. rate 18, height 5\' 7"  (1.702 m), weight 57.4 kg (126 lb 8.7 oz), SpO2 97 %. Physical Exam  Vitals reviewed. Constitutional: He is oriented to person, place, and time.  HENT:  Head: Normocephalic.  Eyes: EOM are normal.  Neck: Normal range of motion. Neck supple. No thyromegaly present.  Cardiovascular: Normal rate and regular rhythm.  Respiratory: Effort normal and breath sounds normal. No respiratory distress.  GI: Soft. Bowel sounds are normal. He exhibits no distension.  Neurological: He is alert and oriented to person, place, and time.  Skin:  BKA site is dressed and appropriately tender  Upper  extremities have evidence of atrophy in the hand intrinsics.  Left foot with intrinsic atrophy.  Sensation is intact in the upper extremities but reduced below the knee in the left lower extremity.  Right lower extremity not tested does have Ace wrap. Motor strength is 5/5 bilateral deltoid bicep tricep 4/5 bilateral grip 4/5 bilateral hip flexors patient does have active right knee extension and flexion not tested against resistance secondary to recent surgery, left lower extremity 4/5 in the knee extensors and 3- ankle dorsiflexor plantar flexor  Results for orders placed or performed during the hospital encounter of 07/11/17 (from the past 24 hour(s))  Glucose, capillary  Status: Abnormal   Collection Time: 07/15/17  7:50 AM  Result Value Ref Range   Glucose-Capillary 267 (H) 65 - 99 mg/dL   Comment 1 Notify RN    Comment 2 Document in Chart   Glucose, capillary     Status: Abnormal   Collection Time: 07/15/17 10:09 AM  Result Value Ref Range   Glucose-Capillary 255 (H) 65 - 99 mg/dL   Comment 1 Notify RN    Comment 2 Document in Chart   I-STAT 4, (NA,K, GLUC, HGB,HCT)     Status: Abnormal   Collection Time: 07/15/17 10:36 AM  Result Value Ref Range   Sodium 135 135 - 145 mmol/L   Potassium 4.3 3.5 - 5.1 mmol/L   Glucose, Bld 267 (H) 65 - 99 mg/dL   HCT 40.9 (L) 81.1 - 91.4 %   Hemoglobin 7.8 (L) 13.0 - 17.0 g/dL  Type and screen [facility]     Status: None (Preliminary result)   Collection Time: 07/15/17 11:34 AM  Result Value Ref Range   ABO/RH(D) O POS    Antibody Screen NEG    Sample Expiration 07/18/2017    Unit Number [id number]    Blood Component Type RED CELLS,LR    Unit division 00    Status of Unit ISSUED    Transfusion Status OK TO TRANSFUSE    Crossmatch Result      Compatible Performed at [facility], [address], [place], [place] [address]    Unit Number [id number]    Blood Component Type RED CELLS,LR    Unit division 00     Status of Unit ISSUED    Transfusion Status OK TO TRANSFUSE    Crossmatch Result Compatible   Prepare RBC     Status: None   Collection Time: 07/15/17 11:34 AM  Result Value Ref Range   Order Confirmation      ORDER PROCESSED BY BLOOD BANK Performed at [facility], [address], [place], [place] [address]   Prepare RBC     Status: None   Collection Time: 07/15/17 11:38 AM  Result Value Ref Range   Order Confirmation      ORDER PROCESSED BY BLOOD BANK Performed at [facility], [address] [place], [place] [address]   Hemoglobin and hematocrit, blood     Status: Abnormal   Collection Time: 07/15/17  4:20 PM  Result Value Ref Range   Hemoglobin 8.9 (L) 13.0 - 17.0 g/dL   HCT 40.3 (L) 47.4 - 25.9 %  Glucose, capillary     Status: Abnormal   Collection Time: 07/15/17  4:40 PM  Result Value Ref Range   Glucose-Capillary 329 (H) 65 - 99 mg/dL   Comment 1 Notify RN    Comment 2 Document in Chart   Glucose, capillary     Status: Abnormal   Collection Time: 07/15/17  9:17 PM  Result Value Ref Range   Glucose-Capillary 222 (H) 65 - 99 mg/dL  Basic metabolic panel     Status: Abnormal   Collection Time: 07/16/17  3:51 AM  Result Value Ref Range   Sodium 133 (L) 135 - 145 mmol/L   Potassium 5.1 3.5 - 5.1 mmol/L   Chloride 98 (L) 101 - 111 mmol/L   CO2 22 22 - 32 mmol/L   Glucose, Bld 226 (H) 65 - 99 mg/dL   BUN 47 (H) 6 - 20 mg/dL   Creatinine, Ser 5.63 (H) 0.61 -  1.24 mg/dL   Calcium 6.9 (L) 8.9 - 10.3 mg/dL   GFR calc non Af Amer 5 (L) >60 mL/min   GFR calc Af Amer 6 (L) >60 mL/min   Anion gap 13 5 - 15  CBC     Status: Abnormal   Collection Time: 07/16/17  3:51 AM  Result Value Ref Range   WBC 12.6 (H) 4.0 - 10.5 K/uL   RBC 3.47 (L) 4.22 - 5.81 MIL/uL   Hemoglobin 9.4 (L) 13.0 - 17.0 g/dL   HCT 30.8 (L) 65.7 - 84.6 %   MCV 85.9 78.0 - 100.0 fL   MCH 27.1 26.0 - 34.0 pg   MCHC 31.5 30.0 - 36.0 g/dL   RDW 96.2 (H) 95.2 - 84.1 %   Platelets 184 150 -  400 K/uL   No results found.   Assessment/Plan: Diagnosis: Right BKA nontraumatic patient with peripheral vascular disease 1. Does the need for close, 24 hr/day medical supervision in concert with the patient's rehab needs make it unreasonable for this patient to be served in a less intensive setting? Yes 2. Co-Morbidities requiring supervision/potential complications: Diabetes with peripheral neuropathy, acute blood loss anemia, end-stage renal disease with hemodialysis 3. Due to bladder management, bowel management, safety, skin/wound care, disease management, medication administration, pain management and patient education, does the patient require 24 hr/day rehab nursing? Yes 4. Does the patient require coordinated care of a physician, rehab nurse, PT (1-2 hrs/day, 5 days/week) and OT (1-2 hrs/day, 5 days/week) to address physical and functional deficits in the context of the above medical diagnosis(es)? Yes Addressing deficits in the following areas: balance, endurance, locomotion, strength, transferring, bowel/bladder control, bathing, dressing, grooming, toileting, cognition and psychosocial support 5. Can the patient actively participate in an intensive therapy program of at least 3 hrs of therapy per day at least 5 days per week? Should be able to in 1 to 2 days 6. The potential for patient to make measurable gains while on inpatient rehab is good 7. Anticipated functional outcomes upon discharge from inpatient rehab are modified independent and supervision  with PT, modified independent and supervision with OT, n/a with SLP. 8. Estimated rehab length of stay to reach the above functional goals is: 7 to 10 days 9. Anticipated D/C setting: Home 10. Anticipated post D/C treatments: HH therapy 11. Overall Rehab/Functional Prognosis: good  RECOMMENDATIONS: This patient's condition is appropriate for continued rehabilitative care in the following setting: CIR Patient has agreed to  participate in recommended program. Yes Note that insurance prior authorization may be required for reimbursement for recommended care.  Comment:   Erick Colace M.D. [facility] FAAPM&R (Sports Med, Neuromuscular Med) Diplomate Am Board of Electrodiagnostic Med  Charlton Amor, PA-C 07/16/2017

## 2017-07-16 NOTE — Progress Notes (Signed)
PT Cancellation Note  Patient Details Name: Charles Vasquez MRN: [id number] DOB: [birth year]   Cancelled Treatment:    Reason Eval/Treat Not Completed: Patient at procedure or test/unavailable. Pt in HD.   [name] 07/16/2017, 8:27 AM  Skip Mayer PT [phone]

## 2017-07-16 NOTE — Progress Notes (Signed)
Left ABI completed. Unable to obtain due to non compressible arteries throughout. Doppler waveforms obtained throughout. [employer], RVS 07/16/2017 6:13 PM

## 2017-07-16 NOTE — Progress Notes (Signed)
PHARMACIST - PHYSICIAN COMMUNICATION  CONCERNING:  Zosyn   RECOMMENDATION: Consider narrowing antibiotic to Ceftriaxone to complete course?  DESCRIPTION: S/p BKA with Proteus / Strep in blood cultures -> sensitive to Ceftriaxone  Thank you Okey Regal, PharmD [phone]

## 2017-07-16 NOTE — Progress Notes (Signed)
PT Cancellation Note  Patient Details Name: Charles Vasquez MRN: [id number] DOB: [birth year]   Cancelled Treatment:    Reason Eval/Treat Not Completed: Patient at procedure or test/unavailable. Pt still in HD.   [name] 07/16/2017, 12:12 PM [employer] PT [phone]

## 2017-07-16 NOTE — Progress Notes (Signed)
Inpatient Rehabilitation-Admissions Coordinator   [facility] attempted to speak with patient about potential inpatient rehabilitation. At this time, pt was too lethargic after HD to have appropriate conversation about CIR. AC will follow up with pt once he is more alert. AC awaiting therapy evaluations for recommendations.   Nanine Means, OTR/L  Rehab Admissions Coordinator  [phone] 07/16/2017 2:14 PM

## 2017-07-16 NOTE — Progress Notes (Addendum)
  Progress Note    07/16/2017 7:14 AM 1 Day Post-Op  Subjective:  No complaints this morning.  Pain is better controlled.   Vitals:   07/16/17 0012 07/16/17 0307  BP: (!) 131/51 (!) 132/52  Pulse: 70 72  Resp: 20 18  Temp:  98.4 F (36.9 C)  SpO2: 98% 97%    Physical Exam: Incisions:  Dressing left in place R BKA stump; no breakthrough bleeding   CBC    Component Value Date/Time   WBC 12.6 (H) 07/16/2017 0351   RBC 3.47 (L) 07/16/2017 0351   HGB 9.4 (L) 07/16/2017 0351   HGB 11.8 (L) 11/29/2011 0614   HCT 29.8 (L) 07/16/2017 0351   HCT 34.9 (L) 11/29/2011 0614   PLT 184 07/16/2017 0351   PLT 97 (L) 11/29/2011 0614   MCV 85.9 07/16/2017 0351   MCV 92 11/29/2011 0614   MCH 27.1 07/16/2017 0351   MCHC 31.5 07/16/2017 0351   RDW 15.6 (H) 07/16/2017 0351   RDW 13.8 11/29/2011 0614   LYMPHSABS 0.8 07/13/2017 0248   LYMPHSABS 0.9 (L) 11/29/2011 0614   MONOABS 0.9 07/13/2017 0248   MONOABS 0.3 11/29/2011 0614   EOSABS 0.1 07/13/2017 0248   EOSABS 0.0 11/29/2011 0614   BASOSABS 0.0 07/13/2017 0248   BASOSABS 0.0 11/29/2011 0614    BMET    Component Value Date/Time   NA 133 (L) 07/16/2017 0351   NA 138 10/15/2011 1339   K 5.1 07/16/2017 0351   K 4.5 11/28/2011 1340   CL 98 (L) 07/16/2017 0351   CL 104 10/15/2011 1339   CO2 22 07/16/2017 0351   CO2 24 10/15/2011 1339   GLUCOSE 226 (H) 07/16/2017 0351   GLUCOSE 121 (H) 10/15/2011 1339   BUN 47 (H) 07/16/2017 0351   BUN 47 (H) 10/15/2011 1339   CREATININE 8.31 (H) 07/16/2017 0351   CREATININE 3.88 (H) 10/15/2011 1339   CALCIUM 6.9 (L) 07/16/2017 0351   CALCIUM 8.1 (L) 10/15/2011 1339   GFRNONAA 5 (L) 07/16/2017 0351   GFRNONAA 14 (L) 10/15/2011 1339   GFRAA 6 (L) 07/16/2017 0351   GFRAA 17 (L) 10/15/2011 1339    INR    Component Value Date/Time   INR 1.12 07/12/2017 0213     Intake/Output Summary (Last 24 hours) at 07/16/2017 0714 Last data filed at 07/15/2017 1339 Gross per 24 hour  Intake 1910 ml    Output 500 ml  Net 1410 ml     Assessment/Plan:  82 y.o. male is s/p right below knee amputation  1 Day Post-Op  -Hgb 9.4 this morning after 2 units RBCs -Dressing changes starting tomorrow - HD today per Nephrology - D/c planning per primary team   [name], PA-C [facility] [phone] 07/16/2017 7:14 AM  Addendum  I have independently interviewed and examined the patient, and I agree with the physician assistant's findings.  L plantar surface of MT has some ischemic skin suspicious for early calciphylaxis.  Some progression in gangrene in R hand.    - LLE ABI and arterial duplex  [name], MD, FACS [facility] [place] Office: [phone] Pager: [phone]  07/16/2017, 7:46 AM

## 2017-07-17 ENCOUNTER — Other Ambulatory Visit: Payer: Self-pay

## 2017-07-17 ENCOUNTER — Inpatient Hospital Stay (HOSPITAL_COMMUNITY)
Admission: RE | Admit: 2017-07-17 | Discharge: 2017-07-31 | DRG: 559 | Disposition: A | Discharge status: Skilled Nursing Facility | Payer: Medicare Other | Source: Intra-hospital | Attending: Physical Medicine & Rehabilitation | Admitting: Physical Medicine & Rehabilitation

## 2017-07-17 ENCOUNTER — Encounter (HOSPITAL_COMMUNITY): Payer: Self-pay | Admitting: *Deleted

## 2017-07-17 DIAGNOSIS — I169 Hypertensive crisis, unspecified: Secondary | ICD-10-CM

## 2017-07-17 DIAGNOSIS — Z961 Presence of intraocular lens: Secondary | ICD-10-CM | POA: Diagnosis present

## 2017-07-17 DIAGNOSIS — I252 Old myocardial infarction: Secondary | ICD-10-CM

## 2017-07-17 DIAGNOSIS — I10 Essential (primary) hypertension: Secondary | ICD-10-CM

## 2017-07-17 DIAGNOSIS — D631 Anemia in chronic kidney disease: Secondary | ICD-10-CM | POA: Diagnosis present

## 2017-07-17 DIAGNOSIS — M25512 Pain in left shoulder: Secondary | ICD-10-CM | POA: Diagnosis not present

## 2017-07-17 DIAGNOSIS — Z7401 Bed confinement status: Secondary | ICD-10-CM | POA: Diagnosis not present

## 2017-07-17 DIAGNOSIS — Z833 Family history of diabetes mellitus: Secondary | ICD-10-CM | POA: Diagnosis not present

## 2017-07-17 DIAGNOSIS — E11649 Type 2 diabetes mellitus with hypoglycemia without coma: Secondary | ICD-10-CM | POA: Diagnosis not present

## 2017-07-17 DIAGNOSIS — N2581 Secondary hyperparathyroidism of renal origin: Secondary | ICD-10-CM | POA: Diagnosis present

## 2017-07-17 DIAGNOSIS — Z9842 Cataract extraction status, left eye: Secondary | ICD-10-CM

## 2017-07-17 DIAGNOSIS — Z4781 Encounter for orthopedic aftercare following surgical amputation: Principal | ICD-10-CM

## 2017-07-17 DIAGNOSIS — E785 Hyperlipidemia, unspecified: Secondary | ICD-10-CM | POA: Diagnosis not present

## 2017-07-17 DIAGNOSIS — E1151 Type 2 diabetes mellitus with diabetic peripheral angiopathy without gangrene: Secondary | ICD-10-CM | POA: Diagnosis present

## 2017-07-17 DIAGNOSIS — K5900 Constipation, unspecified: Secondary | ICD-10-CM | POA: Diagnosis present

## 2017-07-17 DIAGNOSIS — D649 Anemia, unspecified: Secondary | ICD-10-CM | POA: Diagnosis not present

## 2017-07-17 DIAGNOSIS — S88111A Complete traumatic amputation at level between knee and ankle, right lower leg, initial encounter: Secondary | ICD-10-CM | POA: Diagnosis not present

## 2017-07-17 DIAGNOSIS — G8929 Other chronic pain: Secondary | ICD-10-CM | POA: Diagnosis not present

## 2017-07-17 DIAGNOSIS — M6281 Muscle weakness (generalized): Secondary | ICD-10-CM | POA: Diagnosis not present

## 2017-07-17 DIAGNOSIS — I120 Hypertensive chronic kidney disease with stage 5 chronic kidney disease or end stage renal disease: Secondary | ICD-10-CM | POA: Diagnosis not present

## 2017-07-17 DIAGNOSIS — Z9841 Cataract extraction status, right eye: Secondary | ICD-10-CM | POA: Diagnosis not present

## 2017-07-17 DIAGNOSIS — Z794 Long term (current) use of insulin: Secondary | ICD-10-CM

## 2017-07-17 DIAGNOSIS — D62 Acute posthemorrhagic anemia: Secondary | ICD-10-CM

## 2017-07-17 DIAGNOSIS — E1142 Type 2 diabetes mellitus with diabetic polyneuropathy: Secondary | ICD-10-CM | POA: Diagnosis present

## 2017-07-17 DIAGNOSIS — D638 Anemia in other chronic diseases classified elsewhere: Secondary | ICD-10-CM

## 2017-07-17 DIAGNOSIS — Z8249 Family history of ischemic heart disease and other diseases of the circulatory system: Secondary | ICD-10-CM | POA: Diagnosis not present

## 2017-07-17 DIAGNOSIS — W19XXXA Unspecified fall, initial encounter: Secondary | ICD-10-CM

## 2017-07-17 DIAGNOSIS — R7309 Other abnormal glucose: Secondary | ICD-10-CM

## 2017-07-17 DIAGNOSIS — Z888 Allergy status to other drugs, medicaments and biological substances status: Secondary | ICD-10-CM | POA: Diagnosis not present

## 2017-07-17 DIAGNOSIS — N186 End stage renal disease: Secondary | ICD-10-CM | POA: Diagnosis not present

## 2017-07-17 DIAGNOSIS — I2510 Atherosclerotic heart disease of native coronary artery without angina pectoris: Secondary | ICD-10-CM | POA: Diagnosis present

## 2017-07-17 DIAGNOSIS — Z8673 Personal history of transient ischemic attack (TIA), and cerebral infarction without residual deficits: Secondary | ICD-10-CM | POA: Diagnosis not present

## 2017-07-17 DIAGNOSIS — D72829 Elevated white blood cell count, unspecified: Secondary | ICD-10-CM | POA: Diagnosis not present

## 2017-07-17 DIAGNOSIS — R2689 Other abnormalities of gait and mobility: Secondary | ICD-10-CM | POA: Diagnosis not present

## 2017-07-17 DIAGNOSIS — S4992XA Unspecified injury of left shoulder and upper arm, initial encounter: Secondary | ICD-10-CM | POA: Diagnosis not present

## 2017-07-17 DIAGNOSIS — M2550 Pain in unspecified joint: Secondary | ICD-10-CM | POA: Diagnosis not present

## 2017-07-17 DIAGNOSIS — R7881 Bacteremia: Secondary | ICD-10-CM

## 2017-07-17 DIAGNOSIS — E162 Hypoglycemia, unspecified: Secondary | ICD-10-CM | POA: Diagnosis not present

## 2017-07-17 DIAGNOSIS — R0989 Other specified symptoms and signs involving the circulatory and respiratory systems: Secondary | ICD-10-CM | POA: Diagnosis not present

## 2017-07-17 DIAGNOSIS — I959 Hypotension, unspecified: Secondary | ICD-10-CM | POA: Diagnosis not present

## 2017-07-17 DIAGNOSIS — Z992 Dependence on renal dialysis: Secondary | ICD-10-CM

## 2017-07-17 DIAGNOSIS — E1122 Type 2 diabetes mellitus with diabetic chronic kidney disease: Secondary | ICD-10-CM | POA: Diagnosis not present

## 2017-07-17 DIAGNOSIS — Z89511 Acquired absence of right leg below knee: Secondary | ICD-10-CM

## 2017-07-17 DIAGNOSIS — K219 Gastro-esophageal reflux disease without esophagitis: Secondary | ICD-10-CM | POA: Diagnosis not present

## 2017-07-17 DIAGNOSIS — G546 Phantom limb syndrome with pain: Secondary | ICD-10-CM | POA: Diagnosis present

## 2017-07-17 DIAGNOSIS — I739 Peripheral vascular disease, unspecified: Secondary | ICD-10-CM | POA: Diagnosis not present

## 2017-07-17 LAB — CBC WITH DIFFERENTIAL/PLATELET
Basophils Absolute: 0 10*3/uL (ref 0.0–0.1)
Basophils Relative: 0 %
EOS ABS: 0.2 10*3/uL (ref 0.0–0.7)
EOS PCT: 2 %
HCT: 28.4 % — ABNORMAL LOW (ref 39.0–52.0)
Hemoglobin: 8.9 g/dL — ABNORMAL LOW (ref 13.0–17.0)
LYMPHS ABS: 0.9 10*3/uL (ref 0.7–4.0)
LYMPHS PCT: 8 %
MCH: 27.4 pg (ref 26.0–34.0)
MCHC: 31.3 g/dL (ref 30.0–36.0)
MCV: 87.4 fL (ref 78.0–100.0)
MONO ABS: 1.2 10*3/uL — AB (ref 0.1–1.0)
Monocytes Relative: 10 %
Neutro Abs: 9 10*3/uL — ABNORMAL HIGH (ref 1.7–7.7)
Neutrophils Relative %: 80 %
PLATELETS: 202 10*3/uL (ref 150–400)
RBC: 3.25 MIL/uL — ABNORMAL LOW (ref 4.22–5.81)
RDW: 15.5 % (ref 11.5–15.5)
WBC: 11.2 10*3/uL — AB (ref 4.0–10.5)

## 2017-07-17 LAB — RENAL FUNCTION PANEL
Albumin: 1.9 g/dL — ABNORMAL LOW (ref 3.5–5.0)
Anion gap: 10 (ref 5–15)
BUN: 24 mg/dL — AB (ref 6–20)
CHLORIDE: 96 mmol/L — AB (ref 101–111)
CO2: 27 mmol/L (ref 22–32)
Calcium: 6.9 mg/dL — ABNORMAL LOW (ref 8.9–10.3)
Creatinine, Ser: 5.44 mg/dL — ABNORMAL HIGH (ref 0.61–1.24)
GFR calc Af Amer: 10 mL/min — ABNORMAL LOW (ref >60)
GFR, EST NON AFRICAN AMERICAN: 9 mL/min — AB (ref >60)
Glucose, Bld: 111 mg/dL — ABNORMAL HIGH (ref 65–99)
POTASSIUM: 4.3 mmol/L (ref 3.5–5.1)
Phosphorus: 3.9 mg/dL (ref 2.5–4.6)
Sodium: 133 mmol/L — ABNORMAL LOW (ref 135–145)

## 2017-07-17 LAB — BASIC METABOLIC PANEL
Anion gap: 9 (ref 5–15)
BUN: 24 mg/dL — AB (ref 6–20)
CO2: 28 mmol/L (ref 22–32)
CREATININE: 5.35 mg/dL — AB (ref 0.61–1.24)
Calcium: 7 mg/dL — ABNORMAL LOW (ref 8.9–10.3)
Chloride: 97 mmol/L — ABNORMAL LOW (ref 101–111)
GFR calc Af Amer: 11 mL/min — ABNORMAL LOW (ref >60)
GFR calc non Af Amer: 9 mL/min — ABNORMAL LOW (ref >60)
GLUCOSE: 120 mg/dL — AB (ref 65–99)
POTASSIUM: 4.1 mmol/L (ref 3.5–5.1)
SODIUM: 134 mmol/L — AB (ref 135–145)

## 2017-07-17 LAB — CULTURE, BLOOD (ROUTINE X 2)
CULTURE: NO GROWTH
SPECIAL REQUESTS: ADEQUATE

## 2017-07-17 LAB — CBC
HEMATOCRIT: 28.1 % — AB (ref 39.0–52.0)
Hemoglobin: 8.7 g/dL — ABNORMAL LOW (ref 13.0–17.0)
MCH: 27 pg (ref 26.0–34.0)
MCHC: 31 g/dL (ref 30.0–36.0)
MCV: 87.3 fL (ref 78.0–100.0)
PLATELETS: 201 10*3/uL (ref 150–400)
RBC: 3.22 MIL/uL — ABNORMAL LOW (ref 4.22–5.81)
RDW: 15.4 % (ref 11.5–15.5)
WBC: 12.8 10*3/uL — AB (ref 4.0–10.5)

## 2017-07-17 LAB — CREATININE, SERUM
CREATININE: 6.85 mg/dL — AB (ref 0.61–1.24)
GFR, EST AFRICAN AMERICAN: 8 mL/min — AB (ref >60)
GFR, EST NON AFRICAN AMERICAN: 7 mL/min — AB (ref >60)

## 2017-07-17 LAB — GLUCOSE, CAPILLARY
GLUCOSE-CAPILLARY: 196 mg/dL — AB (ref 65–99)
GLUCOSE-CAPILLARY: 136 mg/dL — AB (ref 65–99)
Glucose-Capillary: 68 mg/dL (ref 65–99)
Glucose-Capillary: 138 mg/dL — ABNORMAL HIGH (ref 65–99)

## 2017-07-17 MED ORDER — SODIUM CHLORIDE 0.9 % IV SOLN: 100.0000 mL | INTRAVENOUS | Status: DC | PRN

## 2017-07-17 MED ORDER — ONDANSETRON HCL 4 MG/2ML IJ SOLN
4.0000 mg | Freq: Four times a day (QID) | INTRAMUSCULAR | Status: DC | PRN
  Administered 2017-07-20: 4 mg via INTRAVENOUS
  Filled 2017-07-17: qty 2

## 2017-07-17 MED ORDER — DICYCLOMINE HCL 10 MG PO CAPS
10.0000 mg | ORAL_CAPSULE | Freq: Two times a day (BID) | ORAL | Status: DC
  Administered 2017-07-18 – 2017-07-31 (×27): 10 mg via ORAL
  Filled 2017-07-17 (×30): qty 1

## 2017-07-17 MED ORDER — LIDOCAINE-PRILOCAINE 2.5-2.5 % EX CREA: 1.0000 "application " | TOPICAL_CREAM | CUTANEOUS | Status: DC | PRN

## 2017-07-17 MED ORDER — LIDOCAINE HCL (PF) 1 % IJ SOLN: 5.0000 mL | INTRAMUSCULAR | Status: DC | PRN

## 2017-07-17 MED ORDER — PENTAFLUOROPROP-TETRAFLUOROETH EX AERO: 1.0000 "application " | INHALATION_SPRAY | CUTANEOUS | Status: DC | PRN

## 2017-07-17 MED ORDER — ALTEPLASE 2 MG IJ SOLR: 2.0000 mg | Freq: Once | INTRAMUSCULAR | Status: DC | PRN

## 2017-07-17 MED ORDER — HEPARIN SODIUM (PORCINE) 5000 UNIT/ML IJ SOLN
5000.0000 [IU] | Freq: Three times a day (TID) | INTRAMUSCULAR | Status: DC
  Administered 2017-07-17 – 2017-07-31 (×36): 5000 [IU] via SUBCUTANEOUS
  Filled 2017-07-17 (×36): qty 1

## 2017-07-17 MED ORDER — SORBITOL 70 % SOLN
30.0000 mL | Freq: Every day | Status: DC | PRN
  Administered 2017-07-30: 30 mL via ORAL
  Filled 2017-07-17: qty 30

## 2017-07-17 MED ORDER — TRAMADOL HCL 50 MG PO TABS
50.0000 mg | ORAL_TABLET | Freq: Four times a day (QID) | ORAL | Status: DC | PRN
  Administered 2017-07-18 – 2017-07-29 (×16): 50 mg via ORAL
  Filled 2017-07-17 (×15): qty 1

## 2017-07-17 MED ORDER — INSULIN GLARGINE 100 UNIT/ML ~~LOC~~ SOLN
8.0000 [IU] | Freq: Every day | SUBCUTANEOUS | Status: DC
  Administered 2017-07-17 – 2017-07-19 (×3): 8 [IU] via SUBCUTANEOUS
  Filled 2017-07-17 (×3): qty 0.08

## 2017-07-17 MED ORDER — ACETAMINOPHEN 325 MG PO TABS
650.0000 mg | ORAL_TABLET | Freq: Four times a day (QID) | ORAL | Status: DC | PRN
  Administered 2017-07-18 – 2017-07-22 (×2): 650 mg via ORAL
  Filled 2017-07-17 (×3): qty 2

## 2017-07-17 MED ORDER — POLYVINYL ALCOHOL 1.4 % OP SOLN
1.0000 [drp] | Freq: Three times a day (TID) | OPHTHALMIC | Status: DC | PRN
  Filled 2017-07-17: qty 15

## 2017-07-17 MED ORDER — SEVELAMER CARBONATE 800 MG PO TABS: 800.0000 mg | ORAL_TABLET | ORAL | Status: DC | PRN

## 2017-07-17 MED ORDER — PHENOL 1.4 % MT LIQD
1.0000 | OROMUCOSAL | Status: DC | PRN
Start: 2017-07-17 — End: 2017-07-31

## 2017-07-17 MED ORDER — LOPERAMIDE HCL 2 MG PO CAPS: 2.0000 mg | ORAL_CAPSULE | Freq: Three times a day (TID) | ORAL | Status: DC | PRN

## 2017-07-17 MED ORDER — HEPARIN SODIUM (PORCINE) 1000 UNIT/ML DIALYSIS: 1000.0000 [IU] | INTRAMUSCULAR | Status: DC | PRN

## 2017-07-17 MED ORDER — SENNOSIDES-DOCUSATE SODIUM 8.6-50 MG PO TABS: 1.0000 | ORAL_TABLET | Freq: Every evening | ORAL | Status: DC | PRN

## 2017-07-17 MED ORDER — INSULIN ASPART 100 UNIT/ML ~~LOC~~ SOLN
0.0000 [IU] | Freq: Three times a day (TID) | SUBCUTANEOUS | Status: DC
  Administered 2017-07-18 – 2017-07-19 (×3): 1 [IU] via SUBCUTANEOUS
  Administered 2017-07-19: 2 [IU] via SUBCUTANEOUS
  Administered 2017-07-20: 5 [IU] via SUBCUTANEOUS
  Administered 2017-07-21 – 2017-07-23 (×3): 2 [IU] via SUBCUTANEOUS
  Administered 2017-07-23: 3 [IU] via SUBCUTANEOUS
  Administered 2017-07-24 – 2017-07-26 (×4): 2 [IU] via SUBCUTANEOUS
  Administered 2017-07-26: 1 [IU] via SUBCUTANEOUS
  Administered 2017-07-26 – 2017-07-27 (×2): 2 [IU] via SUBCUTANEOUS
  Administered 2017-07-27 – 2017-07-28 (×2): 1 [IU] via SUBCUTANEOUS
  Administered 2017-07-28 – 2017-07-30 (×4): 2 [IU] via SUBCUTANEOUS
  Administered 2017-07-30: 3 [IU] via SUBCUTANEOUS
  Administered 2017-07-30: 1 [IU] via SUBCUTANEOUS
  Administered 2017-07-31: 3 [IU] via SUBCUTANEOUS

## 2017-07-17 MED ORDER — ONDANSETRON HCL 4 MG PO TABS
4.0000 mg | ORAL_TABLET | Freq: Four times a day (QID) | ORAL | Status: DC | PRN
  Administered 2017-07-27: 4 mg via ORAL
  Filled 2017-07-17: qty 1

## 2017-07-17 MED ORDER — PANTOPRAZOLE SODIUM 40 MG PO TBEC
40.0000 mg | DELAYED_RELEASE_TABLET | Freq: Two times a day (BID) | ORAL | Status: DC
  Administered 2017-07-18 – 2017-07-31 (×26): 40 mg via ORAL
  Filled 2017-07-17 (×26): qty 1

## 2017-07-17 MED ORDER — HEPARIN SODIUM (PORCINE) 5000 UNIT/ML IJ SOLN: 5000.0000 [IU] | Freq: Three times a day (TID) | INTRAMUSCULAR | Status: DC

## 2017-07-17 MED ORDER — RENA-VITE PO TABS
1.0000 | ORAL_TABLET | Freq: Every day | ORAL | Status: DC
  Administered 2017-07-18 – 2017-07-31 (×14): 1 via ORAL
  Filled 2017-07-17 (×14): qty 1

## 2017-07-17 MED ORDER — ACETAMINOPHEN 650 MG RE SUPP: 650.0000 mg | Freq: Four times a day (QID) | RECTAL | Status: DC | PRN

## 2017-07-17 MED ORDER — CEFTRIAXONE SODIUM 2 G IJ SOLR
2.0000 g | INTRAMUSCULAR | Status: AC
  Administered 2017-07-18 – 2017-07-22 (×5): 2 g via INTRAVENOUS
  Filled 2017-07-17 (×6): qty 20

## 2017-07-17 MED ORDER — SEVELAMER CARBONATE 800 MG PO TABS
1600.0000 mg | ORAL_TABLET | Freq: Three times a day (TID) | ORAL | Status: DC
  Administered 2017-07-18 – 2017-07-31 (×39): 1600 mg via ORAL
  Filled 2017-07-17 (×39): qty 2

## 2017-07-17 MED ORDER — ALUM & MAG HYDROXIDE-SIMETH 200-200-20 MG/5ML PO SUSP
15.0000 mL | ORAL | Status: DC | PRN
  Administered 2017-07-27: 30 mL via ORAL
  Filled 2017-07-17: qty 30

## 2017-07-17 MED ORDER — POLYSACCHARIDE IRON COMPLEX 150 MG PO CAPS
150.0000 mg | ORAL_CAPSULE | Freq: Two times a day (BID) | ORAL | Status: DC
  Administered 2017-07-17 – 2017-07-31 (×28): 150 mg via ORAL
  Filled 2017-07-17 (×28): qty 1

## 2017-07-17 MED ORDER — CALCIUM CARBONATE ANTACID 500 MG PO CHEW: 1.0000 | CHEWABLE_TABLET | Freq: Every day | ORAL | Status: DC | PRN

## 2017-07-17 NOTE — IPOC Note (Signed)
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 (HCC)   Leukocytosis   Acute blood loss anemia   Anemia of chronic disease   ESRD on dialysis (HCC)   Type 2 diabetes mellitus with peripheral neuropathy (HCC)   Benign essential HTN     Functional Problem List: Nursing Endurance, Motor, Pain, Safety, Skin Integrity  PT Balance, Endurance, Safety, Motor  OT Balance, Cognition, Endurance, Motor, Pain, Vision  SLP    TR         Basic ADL's: OT Bathing, Dressing, Toileting, Grooming     Advanced  ADL's: OT       Transfers: PT Bed Mobility, Bed to Chair, Car, [employer], Civil Service fast streamer, Research scientist (life sciences): PT Ambulation, Psychologist, prison and probation services, Stairs     Additional Impairments: OT None  SLP        TR      Anticipated Outcomes Item Anticipated Outcome  Self Feeding independent  Swallowing      Basic self-care  supervision  Toileting  supervision   Bathroom Transfers supervision  Bowel/Bladder  Continent to bowel and bladder with min. assist.  Transfers  Supervision with LRAD  Locomotion  Supervision at w/c level  Communication     Cognition     Pain  Less than 3,on 1 to 10 scale.  Safety/Judgment  Free from falls during his time in rehab.   Therapy Plan: PT Intensity: Minimum of 1-2 x/day ,45 to 90 minutes PT Frequency: 5 out of 7 days PT Duration Estimated Length of Stay: 14-16 days OT Intensity: Minimum of 1-2 x/day, 45 to 90 minutes OT Frequency: 5 out of 7 days OT Duration/Estimated Length of Stay: 12 - 14 days      Team Interventions: Nursing Interventions Patient/Family Education, Pain Management, Skin Care/Wound Management, Disease Management/Prevention  PT interventions Ambulation/gait training, Warden/ranger, Cognitive  remediation/compensation, Community reintegration, Discharge planning, Disease management/prevention, DME/adaptive equipment instruction, Functional mobility training, Pain management, Patient/family education, Psychosocial support, Splinting/orthotics, Therapeutic Activities, Therapeutic Exercise, UE/LE Strength taining/ROM, UE/LE Coordination activities, Visual/perceptual remediation/compensation, Wheelchair propulsion/positioning  OT Interventions Warden/ranger, Cognitive remediation/compensation, Discharge planning, DME/adaptive equipment instruction, Functional mobility training, Patient/family education, Psychosocial support, Splinting/orthotics, Therapeutic Exercise, Self Care/advanced ADL retraining, Visual/perceptual remediation/compensation, Therapeutic Activities, UE/LE Strength taining/ROM, UE/LE Coordination activities  SLP Interventions    TR Interventions    SW/CM Interventions Discharge Planning, Psychosocial Support, Patient/Family Education   Barriers to Discharge MD  Medical stability, IV antibiotics, Hemodialysis and Weight bearing restrictions  Nursing      PT Decreased caregiver support, Lack of/limited family support, Weight bearing restrictions    OT Decreased caregiver support pt does have family that will be with him part of the time, but need to ensure family understands pt will need 24/7 S  SLP      SW Decreased caregiver support, Home environment access/layout Does not have 24 hr care and will not fit into the bathroom   Team Discharge Planning: Destination: PT-Home ,OT- Home , SLP-  Projected Follow-up: PT-Home health PT, 24 hour supervision/assistance, OT-  Home health OT, SLP-  Projected Equipment Needs: PT-Wheelchair (measurements), Wheelchair cushion (measurements), OT- 3 in 1 bedside comode, Tub/shower bench, SLP-  Equipment Details: PT-pt already owns RW and 4WW, OT-  Patient/family involved in discharge planning: PT- Patient,  OT-Patient,  SLP-   MD ELOS:  12-16 days. Medical Rehab Prognosis:  Good Assessment: 82 year old right-handed male with history of hypertension, CAD/MI end-stage renal disease with hemodialysis Monday Wednesdays and Fridays at [facility] in [facility], diabetes mellitus.  Presented 07/12/2017 with chronic right wound infection progressive ischemic changes.  Patient with recent right lower extremity angiogram with attempted revascularization of right posterior vertebral artery was unsuccessful 5 weeks ago.  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.  Hospital course pain management.  Hemodialysis ongoing as per renal services.  Acute on chronic anemia received 2 units packed red blood cells and monitored.  Blood cultures with Proteus/strep currently maintained on Rocephin.  Follow-up of left lower extremity arterial duplex completed showing mild plaque noted throughout no evidence of significant stenosis. Patient with resulting funcitonal deficits with mobility, transfers, and self-care.  Will set goals Supervision with PT/OT.  See Team Conference Notes for weekly updates to the plan of care

## 2017-07-17 NOTE — Progress Notes (Signed)
Physical Medicine and Rehabilitation Consult Reason for Consult: Decreased functional mobility Referring Physician: Triad   HPI: Charles Vasquez is a 82 y.o. right-handed male with history of hypertension, end-stage renal disease with hemodialysis Monday Wednesday Friday at [facility] dialysis [facility], diabetes mellitus.  Per chart review patient lives alone.  He has a girlfriend that checks on him daily and a sister in the area.  Question assistance on discharge.  One level home.  Presented 07/12/2017 with chronic right wound infection and progressive ischemic changes.  Patient with recent right lower extremity angiogram with attempted revascularization of right posterior tibial artery that was unsuccessful 5 weeks ago.  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.  Hospital course pain management.  Hemodialysis ongoing as per renal services.  Subcutaneous heparin for DVT prophylaxis.  Acute on chronic anemia 9.4.  Physical and occupational therapy evaluations pending.  MD has requested physical medicine rehab consult.  Patient is asking why his right lower extremity is so stiff.  Reminded him that he had surgery yesterday.  Received tramadol this morning for pain.  Just got taken off of dialysis.  Has a femoral AV graft on the left side  Review of Systems  Constitutional: Negative for chills and fever.  HENT: Negative for hearing loss.   Respiratory: Negative for cough.        Occasional shortness of breath with exertion  Cardiovascular: Positive for leg swelling. Negative for chest pain and palpitations.  Gastrointestinal: Positive for constipation. Negative for nausea and vomiting.  Genitourinary: Negative for hematuria.  Musculoskeletal: Positive for joint pain and myalgias.  Skin: Negative for rash.  All other systems reviewed and are negative.      Past Medical History:  Diagnosis Date  . Anal pain   . AVF (arteriovenous  fistula) (HCC) 05-13-2017 per pt currently AVF access used is left thigh   hx multiple AVF surgery's (previously left upper arm, bilateral thigh) last surgery -- right upper arm creation 11-26-2016  . ESRD (end stage renal disease) on dialysis [facility])    [facility] in [place], [place] on MWF   . History of CVA (cerebrovascular accident)    05-13-2017  per pt stated "thats what they told yrs ago"  per pt no residual  . Hyperlipidemia   . Hypertension   . Myocardial infarction (HCC)   . Stroke (HCC)   . Type 2 diabetes mellitus treated with insulin [facility])         Past Surgical History:  Procedure Laterality Date  . ABDOMINAL AORTOGRAM W/LOWER EXTREMITY N/A 06/25/2017   Procedure: ABDOMINAL AORTOGRAM W/LOWER EXTREMITY;  Surgeon: Maeola Harman, MD;  Location: [facility] INVASIVE CV LAB;  Service: Cardiovascular;  Laterality: N/A;  . AMPUTATION Right 01/01/2017   Procedure: REVISION AMPUTATION RIGHT INDEX FINGER;  Surgeon: Dairl Ponder, MD;  Location: [facility] OR;  Service: Orthopedics;  Laterality: Right;  . ARTERIOVENOUS GRAFT PLACEMENT    . ARTERIOVENOUS GRAFT PLACEMENT Left 10/22/2016   thigh  . AV FISTULA PLACEMENT  03/31/2012   Procedure: ARTERIOVENOUS (AV) FISTULA CREATION;  Surgeon: [name], MD;  Location: [facility] OR;  Service: Vascular;  Laterality: Right;  First stage Brachial vein transposition  . AV FISTULA PLACEMENT Right 08/25/2012   Procedure: INSERTION OF ARTERIOVENOUS (AV) GORE-TEX GRAFT ARM;  Surgeon: [name], MD;  Location: [facility] OR;  Service: Vascular;  Laterality: Right;  . AV FISTULA PLACEMENT Left 07/23/2016   Procedure: INSERTION  OF ARTERIOVENOUS (AV) GORE-TEX GRAFT LEFT UPPER ARM;  Surgeon: [name], MD;  Location: [facility] OR;  Service: Vascular;  Laterality: Left;  . AV FISTULA PLACEMENT Left 10/22/2016   Procedure: INSERTION OF 4-58mm x 45cm  ARTERIOVENOUS (AV) GORE-TEX GRAFT THIGH-LEFT;  Surgeon: [name], MD;  Location: [facility] OR;  Service:  Vascular;  Laterality: Left;  . CATARACT EXTRACTION W/ INTRAOCULAR LENS  IMPLANT, BILATERAL    . COLONOSCOPY N/A 03/25/2017   Dr. Darrick Penna: examined portion of ileum normal. Redundant left colon, stricture at anus   . ESOPHAGOGASTRODUODENOSCOPY N/A 03/25/2017   Dr. Darrick Penna: widely patent Schatzki ring at GE junction s/p dilation, atrophic gastritis, single duodenal AVM, non-bleeding  . INSERTION OF DIALYSIS CATHETER Left 05/05/2012   Procedure: INSERTION OF DIALYSIS CATHETER;  Surgeon: Chuck Hint, MD;  Location: [facility] OR;  Service: Vascular;  Laterality: Left;  . INSERTION OF DIALYSIS CATHETER Right 10/01/2016   Procedure: INSERTION OF DIALYSIS CATHETER- RIGHT FEMORAL;  Surgeon: [name], MD;  Location: [facility] OR;  Service: Vascular;  Laterality: Right;  . IR FLUORO GUIDE CV LINE RIGHT  06/10/2016  . IR GENERIC HISTORICAL  06/07/2016   IR US GUIDE VASC ACCESS RIGHT 06/07/2016 Oley Balm, MD [facility]-INTERV RAD  . IR THROMBECTOMY AV FISTULA W/THROMBOLYSIS/PTA INC/SHUNT/IMG RIGHT Right 06/07/2016  . IR US GUIDE VASC ACCESS RIGHT  06/10/2016  . LIGATION ARTERIOVENOUS GORTEX GRAFT Left 08/04/2016   Procedure: LIGATION ARTERIOVENOUS GORTEX GRAFT;  Surgeon: Larina Earthly, MD;  Location: [facility] OR;  Service: Vascular;  Laterality: Left;  . LIGATION ARTERIOVENOUS GORTEX GRAFT Right 11/26/2016   Procedure: LIGATION OF RIGHT UPPER ARM ARTERIOVENOUS GORTEX GRAFT;  Surgeon: [name], MD;  Location: [facility] OR;  Service: Vascular;  Laterality: Right;  . LIGATION OF ARTERIOVENOUS  FISTULA Right 08/25/2012   Procedure: LIGATION OF ARTERIOVENOUS  FISTULA;  Surgeon: [name], MD;  Location: [facility] OR;  Service: Vascular;  Laterality: Right;  Ligation of right brachial vein transposition  . PERIPHERAL VASCULAR ATHERECTOMY  06/25/2017   Procedure: PERIPHERAL VASCULAR ATHERECTOMY;  Surgeon: Maeola Harman, MD;  Location: [facility] INVASIVE CV LAB;  Service: Cardiovascular;;  Rt. PT  . REMOVAL OF A DIALYSIS  CATHETER Right 05/05/2012   Procedure: REMOVAL OF A DIALYSIS CATHETER;  Surgeon: Chuck Hint, MD;  Location: [facility] OR;  Service: Vascular;  Laterality: Right;  . REMOVAL OF A DIALYSIS CATHETER Right 10/01/2016   Procedure: REMOVAL OF A DIALYSIS CATHETER-RIGHT UPPER CHEST;  Surgeon: [name], MD;  Location: [facility] OR;  Service: Vascular;  Laterality: Right;  . REMOVAL OF A DIALYSIS CATHETER Right 11/26/2016   Procedure: REMOVAL OF RIGHT FEMORAL TUNNEL DIALYSIS CATHETER;  Surgeon: [name], MD;  Location: [facility] OR;  Service: Vascular;  Laterality: Right;  . SPHINCTEROTOMY N/A 05/15/2017   Procedure: POSSIBLE SPHINCTEROTOMY (BOTOX);  Surgeon: Romie Levee, MD;  Location: [facility];  Service: General;  Laterality: N/A;  . TOE AMPUTATION  02/2007   left foot first 3 toes  . UPPER EXTREMITY VENOGRAPHY Bilateral 07/18/2016   Procedure: Bilateral Upper Extremity Venography;  Surgeon: [name], MD;  Location: [facility] INVASIVE CV LAB;  Service: Cardiovascular;  Laterality: Bilateral;        Family History  Problem Relation Age of Onset  . Diabetes Mother   . Hypertension Mother   . AAA (abdominal aortic aneurysm) Mother   . Diabetes Father   . Hypertension Father    Social History:  reports that he  has never smoked. He has never used smokeless tobacco. He reports that he does not drink alcohol or use drugs. Allergies:       Allergies  Allergen Reactions  . Lisinopril Other (See Comments)    HYPERKALEMIA RENAL DYSFUNCTION PROGRESSION         Medications Prior to Admission  Medication Sig Dispense Refill  . calcium carbonate (TUMS - DOSED IN MG ELEMENTAL CALCIUM) 500 MG chewable tablet Chew 1 tablet by mouth daily as needed for indigestion or heartburn.    . carboxymethylcellulose (REFRESH PLUS) 0.5 % SOLN Place 1 drop into both eyes 3 (three) times daily as needed (dry eyes). AS DIRECTED     . dicyclomine (BENTYL) 10 MG capsule 1 PO 30 MINUTES  PRIOR TO BREAKFAST AND LUNCH (Patient taking differently: Take 10 mg by mouth 2 (two) times daily. ) 62 capsule 11  . insulin glargine (LANTUS) 100 UNIT/ML injection Inject 5 Units into the skin at bedtime.     Marland Kitchen losartan (COZAAR) 50 MG tablet Take 50 mg by mouth every evening.     . multivitamin (RENA-VIT) TABS tablet Take 1 tablet by mouth daily.    . pantoprazole (PROTONIX) 40 MG tablet 1 PO 30 MINUTES PRIOR TO MEALS BID FOR 3 MOS THEN QD (Patient taking differently: Take 40 mg by mouth 2 (two) times daily. ) 60 tablet 11  . Polysacch Fe Cmp-Fe Heme Poly (BIFERA) 28 MG TABS 1 po bid (Patient taking differently: Take 28 mg by mouth 2 (two) times daily. 1 po bid) 60 tablet 11  . sevelamer carbonate (RENVELA) 800 MG tablet Take 1,600 mg by mouth 3 (three) times daily with meals. Supposed to take one 800 mg with snacks and caregiver said he only does that sometimes    . SSD 1 % cream Apply 1 application topically daily.    . traMADol (ULTRAM) 50 MG tablet Take 1 tablet (50 mg total) by mouth every 6 (six) hours as needed. 30 tablet 0    Home: Home Living Family/patient expects to be discharged to:: Private residence Living Arrangements: Alone  Functional History: Functional Status:  Mobility:  ADL:  Cognition: Cognition Orientation Level: Oriented X4  Blood pressure (!) 132/52, pulse 72, temperature 98.4 F (36.9 C), temperature source Oral, resp. rate 18, height 5\' 7"  (1.702 m), weight 57.4 kg (126 lb 8.7 oz), SpO2 97 %. Physical Exam  Vitals reviewed. Constitutional: He is oriented to person, place, and time.  HENT:  Head: Normocephalic.  Eyes: EOM are normal.  Neck: Normal range of motion. Neck supple. No thyromegaly present.  Cardiovascular: Normal rate and regular rhythm.  Respiratory: Effort normal and breath sounds normal. No respiratory distress.  GI: Soft. Bowel sounds are normal. He exhibits no distension.  Neurological: He is alert and oriented to person,  place, and time.  Skin:  BKA site is dressed and appropriately tender  Upper extremities have evidence of atrophy in the hand intrinsics.  Left foot with intrinsic atrophy.  Sensation is intact in the upper extremities but reduced below the knee in the left lower extremity.  Right lower extremity not tested does have Ace wrap. Motor strength is 5/5 bilateral deltoid bicep tricep 4/5 bilateral grip 4/5 bilateral hip flexors patient does have active right knee extension and flexion not tested against resistance secondary to recent surgery, left lower extremity 4/5 in the knee extensors and 3- ankle dorsiflexor plantar flexor  LabResultsLast24Hours        Results for orders placed or  performed during the hospital encounter of 07/11/17 (from the past 24 hour(s))  Glucose, capillary     Status: Abnormal   Collection Time: 07/15/17  7:50 AM  Result Value Ref Range   Glucose-Capillary 267 (H) 65 - 99 mg/dL   Comment 1 Notify RN    Comment 2 Document in Chart   Glucose, capillary     Status: Abnormal   Collection Time: 07/15/17 10:09 AM  Result Value Ref Range   Glucose-Capillary 255 (H) 65 - 99 mg/dL   Comment 1 Notify RN    Comment 2 Document in Chart   I-STAT 4, (NA,K, GLUC, HGB,HCT)     Status: Abnormal   Collection Time: 07/15/17 10:36 AM  Result Value Ref Range   Sodium 135 135 - 145 mmol/L   Potassium 4.3 3.5 - 5.1 mmol/L   Glucose, Bld 267 (H) 65 - 99 mg/dL   HCT 16.1 (L) 09.6 - 04.5 %   Hemoglobin 7.8 (L) 13.0 - 17.0 g/dL  Type and screen [facility]     Status: None (Preliminary result)   Collection Time: 07/15/17 11:34 AM  Result Value Ref Range   ABO/RH(D) O POS    Antibody Screen NEG    Sample Expiration 07/18/2017    Unit Number [id number]    Blood Component Type RED CELLS,LR    Unit division 00    Status of Unit ISSUED    Transfusion Status OK TO TRANSFUSE    Crossmatch Result      Compatible Performed  at [facility], [address] [place], [place] [address]    Unit Number [id number]    Blood Component Type RED CELLS,LR    Unit division 00    Status of Unit ISSUED    Transfusion Status OK TO TRANSFUSE    Crossmatch Result Compatible   Prepare RBC     Status: None   Collection Time: 07/15/17 11:34 AM  Result Value Ref Range   Order Confirmation      ORDER PROCESSED BY BLOOD BANK Performed at [facility], [address], [place], [place] [address]   Prepare RBC     Status: None   Collection Time: 07/15/17 11:38 AM  Result Value Ref Range   Order Confirmation      ORDER PROCESSED BY BLOOD BANK Performed at [facility], [address] [place], [place] [address]   Hemoglobin and hematocrit, blood     Status: Abnormal   Collection Time: 07/15/17  4:20 PM  Result Value Ref Range   Hemoglobin 8.9 (L) 13.0 - 17.0 g/dL   HCT 02.7 (L) 25.3 - 66.4 %  Glucose, capillary     Status: Abnormal   Collection Time: 07/15/17  4:40 PM  Result Value Ref Range   Glucose-Capillary 329 (H) 65 - 99 mg/dL   Comment 1 Notify RN    Comment 2 Document in Chart   Glucose, capillary     Status: Abnormal   Collection Time: 07/15/17  9:17 PM  Result Value Ref Range   Glucose-Capillary 222 (H) 65 - 99 mg/dL  Basic metabolic panel     Status: Abnormal   Collection Time: 07/16/17  3:51 AM  Result Value Ref Range   Sodium 133 (L) 135 - 145 mmol/L   Potassium 5.1 3.5 - 5.1 mmol/L   Chloride 98 (L) 101 - 111 mmol/L   CO2 22 22 - 32 mmol/L   Glucose, Bld 226 (H) 65 -  99 mg/dL   BUN 47 (H) 6 - 20 mg/dL   Creatinine, Ser 7.98 (H) 0.61 - 1.24 mg/dL   Calcium 6.9 (L) 8.9 - 10.3 mg/dL   GFR calc non Af Amer 5 (L) >60 mL/min   GFR calc Af Amer 6 (L) >60 mL/min   Anion gap 13 5 - 15  CBC     Status: Abnormal   Collection Time: 07/16/17  3:51 AM  Result Value Ref Range   WBC 12.6 (H) 4.0 - 10.5 K/uL   RBC 3.47 (L) 4.22 - 5.81  MIL/uL   Hemoglobin 9.4 (L) 13.0 - 17.0 g/dL   HCT 92.1 (L) 19.4 - 17.4 %   MCV 85.9 78.0 - 100.0 fL   MCH 27.1 26.0 - 34.0 pg   MCHC 31.5 30.0 - 36.0 g/dL   RDW 08.1 (H) 44.8 - 18.5 %   Platelets 184 150 - 400 K/uL     ImagingResults(Last48hours)  No results found.     Assessment/Plan: Diagnosis: Right BKA nontraumatic patient with peripheral vascular disease 1. Does the need for close, 24 hr/day medical supervision in concert with the patient's rehab needs make it unreasonable for this patient to be served in a less intensive setting? Yes 2. Co-Morbidities requiring supervision/potential complications: Diabetes with peripheral neuropathy, acute blood loss anemia, end-stage renal disease with hemodialysis 3. Due to bladder management, bowel management, safety, skin/wound care, disease management, medication administration, pain management and patient education, does the patient require 24 hr/day rehab nursing? Yes 4. Does the patient require coordinated care of a physician, rehab nurse, PT (1-2 hrs/day, 5 days/week) and OT (1-2 hrs/day, 5 days/week) to address physical and functional deficits in the context of the above medical diagnosis(es)? Yes Addressing deficits in the following areas: balance, endurance, locomotion, strength, transferring, bowel/bladder control, bathing, dressing, grooming, toileting, cognition and psychosocial support 5. Can the patient actively participate in an intensive therapy program of at least 3 hrs of therapy per day at least 5 days per week? Should be able to in 1 to 2 days 6. The potential for patient to make measurable gains while on inpatient rehab is good 7. Anticipated functional outcomes upon discharge from inpatient rehab are modified independent and supervision  with PT, modified independent and supervision with OT, n/a with SLP. 8. Estimated rehab length of stay to reach the above functional goals is: 7 to 10 days 9. Anticipated D/C  setting: Home 10. Anticipated post D/C treatments: HH therapy 11. Overall Rehab/Functional Prognosis: good  RECOMMENDATIONS: This patient's condition is appropriate for continued rehabilitative care in the following setting: CIR Patient has agreed to participate in recommended program. Yes Note that insurance prior authorization may be required for reimbursement for recommended care.  Comment:   Erick Colace M.D. [facility] FAAPM&R (Sports Med, Neuromuscular Med) Diplomate Am Board of Electrodiagnostic Med  Charlton Amor, PA-C 07/16/2017

## 2017-07-17 NOTE — Progress Notes (Signed)
PROGRESS NOTE    Charles Vasquez  IHK:[id number] DOB: Mar 31, 1935 DOA: 07/11/2017 PCP: Toma Deiters, MD   Brief Narrative:82 y.o.malewith medical history significant ofhypertension, hyperlipidemia, diabetes mellitus, stroke, GERD, ESRD-HD (MWF), CAD,PVD,who presents with right foot wound infection.  Pt recently underwentright lower extremity angiogram withattempted revascularization of his right posterior tibial artery that was unsuccessful 5 weeks ago. Pt has a right foot heel woundinfection, with serosangious drainage and foul smelling odor.Pt was seen by [facility], Dr. Randie Heinz today.Dressing was changed at office.Pt was told that he might have to have an amputation. Pt was sent to ED for admission forantibiotics and pain control. Patient states that he has some moderate of pain, but no fever or chills. He denies chest pain, shortness of breath, cough, nausea, vomiting, diarrhea or abdominal pain no symptoms of UTI or unilateral weakness. Patient states that he had a dialysis on Friday.  ED Course:pt was found to haveWBC 15.6, lactic acid of 1.24, potassium 4.6, bicarbonate 27, creatinine 5.43, BUN 29, temperature 99.8, no tachycardia, no tachypnea, oxygen saturation 98% on room air. Patient is admitted to telemetry bed as inpatient.   Assessment & Plan:   Principal Problem:   Diabetic foot infection (HCC) Active Problems:   Essential hypertension   GERD   ESRD (end stage renal disease) on dialysis (HCC)   CAD (coronary artery disease)   Type II diabetes mellitus with renal manifestations (HCC)   Wound of right foot   Pressure injury of skin ]diabetic foot wound infection/polimicrobial bacteremia-S/P R BKA.further work up per vascular.CIR consult in place. 2]type 2 diabetes-increase lantus. 3]hypertensionblood pressure soft hold Cozaar. 4]ESRD dialysis Monday Wednesday Friday. 5]post op anemia s/p 2 units prbc.    DVT prophylaxis: heparin Code Status full Family  Communication none Disposition Plan:  tbd Consultants: none  Procedures:r bka Antimicrobialsrocephin  Subjective:   Objective: Vitals:   07/16/17 1949 07/17/17 0103 07/17/17 0400 07/17/17 0818  BP: (!) 155/60 (!) 156/62 (!) 168/72 (!) 181/76  Pulse: 77 80 60 79  Resp: 18 (!) 27 (!) 22 20  Temp: 98.5 F (36.9 C)  98.8 F (37.1 C) 98.5 F (36.9 C)  TempSrc: Oral  Oral Oral  SpO2: 93% 94% 96% 97%  Weight:   55.4 kg (122 lb 2.2 oz)   Height:        Intake/Output Summary (Last 24 hours) at 07/17/2017 1142 Last data filed at 07/17/2017 0821 Gross per 24 hour  Intake 240 ml  Output -  Net 240 ml   Filed Weights   07/16/17 0728 07/16/17 1137 07/17/17 0400  Weight: 57.4 kg (126 lb 8.7 oz) 57.4 kg (126 lb 8.7 oz) 55.4 kg (122 lb 2.2 oz)    Examination:  General exam: Appears calm and comfortable  Respiratory system: Clear to auscultation. Respiratory effort normal. Cardiovascular system: S1 & S2 heard, RRR. No JVD, murmurs, rubs, gallops or clicks. No pedal edema. Gastrointestinal system: Abdomen is nondistended, soft and nontender. No organomegaly or masses felt. Normal bowel sounds heard. Central nervous system: Alert and oriented. No focal neurological deficits. Extremities: Symmetric 5 x 5 power. Skin: No rashes, lesions or ulcers Psychiatry: Judgement and insight appear normal. Mood & affect appropriate.     Data Reviewed: I have personally reviewed following labs and imaging studies  CBC: Recent Labs  Lab 07/11/17 1754 07/13/17 0248 07/15/17 0418 07/15/17 1036 07/15/17 1620 07/16/17 0351 07/17/17 0409  WBC 15.6* 18.5* 16.4*  --   --  12.6* 11.2*  NEUTROABS 13.6* 16.7*  --   --   --   --  9.0*  HGB 10.1* 9.7* 9.0* 7.8* 8.9* 9.4* 8.9*  HCT 33.0* 31.4* 30.1* 23.0* 28.9* 29.8* 28.4*  MCV 86.2 86.0 86.7  --   --  85.9 87.4  PLT 202 222 236  --   --  184 202   Basic Metabolic Panel: Recent Labs  Lab 07/11/17 1754 07/13/17 0248 07/15/17 0418 07/15/17 1036  07/16/17 0351 07/17/17 0409  NA 133* 134* 132* 135 133* 134*  K 4.6 5.0 4.7 4.3 5.1 4.1  CL 92* 94* 93*  --  98* 97*  CO2 27 25 26   --  22 28  GLUCOSE 387* 248* 291* 267* 226* 120*  BUN 29* 50* 33*  --  47* 24*  CREATININE 5.43* 8.23* 6.69*  --  8.31* 5.35*  CALCIUM 7.5* 7.5* 7.3*  --  6.9* 7.0*   GFR: Estimated Creatinine Clearance: 8.8 mL/min (A) (by C-G formula based on SCr of 5.35 mg/dL (H)). Liver Function Tests: Recent Labs  Lab 07/11/17 1754  AST 26  ALT 24  ALKPHOS 160*  BILITOT 0.5  PROT 7.1  ALBUMIN 2.3*   No results for input(s): LIPASE, AMYLASE in the last 168 hours. No results for input(s): AMMONIA in the last 168 hours. Coagulation Profile: Recent Labs  Lab 07/12/17 0213  INR 1.12   Cardiac Enzymes: No results for input(s): CKTOTAL, CKMB, CKMBINDEX, TROPONINI in the last 168 hours. BNP (last 3 results) No results for input(s): PROBNP in the last 8760 hours. HbA1C: No results for input(s): HGBA1C in the last 72 hours. CBG: Recent Labs  Lab 07/15/17 2117 07/16/17 0627 07/16/17 1648 07/16/17 2133 07/17/17 0600  GLUCAP 222* 229* 94 177* 68   Lipid Profile: No results for input(s): CHOL, HDL, LDLCALC, TRIG, CHOLHDL, LDLDIRECT in the last 72 hours. Thyroid Function Tests: No results for input(s): TSH, T4TOTAL, FREET4, T3FREE, THYROIDAB in the last 72 hours. Anemia Panel: No results for input(s): VITAMINB12, FOLATE, FERRITIN, TIBC, IRON, RETICCTPCT in the last 72 hours. Sepsis Labs: Recent Labs  Lab 07/11/17 1813 07/12/17 0213 07/12/17 0215  PROCALCITON  --  21.59  --   LATICACIDVEN 1.24  --  1.3    Recent Results (from the past 240 hour(s))  Culture, blood (routine x 2)     Status: Abnormal (Preliminary result)   Collection Time: 07/12/17  3:47 AM  Result Value Ref Range Status   Specimen Description BLOOD LEFT ANTECUBITAL  Final   Special Requests   Final    BOTTLES DRAWN AEROBIC AND ANAEROBIC Blood Culture adequate volume   Culture   Setup Time   Final    GRAM POSITIVE RODS ANAEROBIC BOTTLE ONLY CRITICAL RESULT CALLED TO, READ BACK BY AND VERIFIED WITH: M MACCIA,PHARMD AT 1614 07/12/17 BY L BENFIELD GRAM NEGATIVE RODS GRAM POSITIVE COCCI IN CHAINS IN BOTH AEROBIC AND ANAEROBIC BOTTLES    Culture (A)  Final    PROTEUS SPECIES STREPTOCOCCUS PARASANGUINIS GRAM POSITIVE RODS CULTURE REINCUBATED FOR BETTER GROWTH Performed at [facility], [address], [place], [place] [address]    Report Status PENDING  Incomplete   Organism ID, Bacteria PROTEUS SPECIES  Final   Organism ID, Bacteria STREPTOCOCCUS PARASANGUINIS  Final      Susceptibility   Streptococcus parasanguinis - MIC*    PENICILLIN 0.12 SENSITIVE Sensitive     CEFTRIAXONE 0.25 SENSITIVE Sensitive     ERYTHROMYCIN <=0.12 SENSITIVE Sensitive     LEVOFLOXACIN 1 SENSITIVE Sensitive     VANCOMYCIN 0.5 SENSITIVE Sensitive     *  STREPTOCOCCUS PARASANGUINIS   Proteus species - MIC*    AMPICILLIN >=32 RESISTANT Resistant     CEFAZOLIN >=64 RESISTANT Resistant     CEFEPIME <=1 SENSITIVE Sensitive     CEFTAZIDIME <=1 SENSITIVE Sensitive     CEFTRIAXONE <=1 SENSITIVE Sensitive     CIPROFLOXACIN <=0.25 SENSITIVE Sensitive     GENTAMICIN <=1 SENSITIVE Sensitive     IMIPENEM 8 INTERMEDIATE Intermediate     TRIMETH/SULFA <=20 SENSITIVE Sensitive     AMPICILLIN/SULBACTAM <=2 SENSITIVE Sensitive     PIP/TAZO <=4 SENSITIVE Sensitive     * PROTEUS SPECIES  Blood Culture ID Panel (Reflexed)     Status: Abnormal   Collection Time: 07/12/17  3:47 AM  Result Value Ref Range Status   Enterococcus species NOT DETECTED NOT DETECTED Final   Listeria monocytogenes NOT DETECTED NOT DETECTED Final   Staphylococcus species NOT DETECTED NOT DETECTED Final   Staphylococcus aureus NOT DETECTED NOT DETECTED Final   Streptococcus species DETECTED (A) NOT DETECTED Final    Comment: Not Enterococcus species, Streptococcus agalactiae, Streptococcus pyogenes, or Streptococcus  pneumoniae. CRITICAL RESULT CALLED TO, READ BACK BY AND VERIFIED WITH: M MACCIA,PHARMD AT 1614 07/12/17 BY L BENFIELD    Streptococcus agalactiae NOT DETECTED NOT DETECTED Final   Streptococcus pneumoniae NOT DETECTED NOT DETECTED Final   Streptococcus pyogenes NOT DETECTED NOT DETECTED Final   Acinetobacter baumannii NOT DETECTED NOT DETECTED Final   Enterobacteriaceae species DETECTED (A) NOT DETECTED Final    Comment: Enterobacteriaceae represent a large family of gram-negative bacteria, not a single organism. CRITICAL RESULT CALLED TO, READ BACK BY AND VERIFIED WITH: M MACCIA,PHARMD AT 1614 07/12/17 BY L BENFIELD    Enterobacter cloacae complex NOT DETECTED NOT DETECTED Final   Escherichia coli NOT DETECTED NOT DETECTED Final   Klebsiella oxytoca NOT DETECTED NOT DETECTED Final   Klebsiella pneumoniae NOT DETECTED NOT DETECTED Final   Proteus species DETECTED (A) NOT DETECTED Final    Comment: CRITICAL RESULT CALLED TO, READ BACK BY AND VERIFIED WITH: M MACCIA,PHARMD AT 1614 07/12/17 BY L BENFIELD    Serratia marcescens NOT DETECTED NOT DETECTED Final   Carbapenem resistance NOT DETECTED NOT DETECTED Final   Haemophilus influenzae NOT DETECTED NOT DETECTED Final   Neisseria meningitidis NOT DETECTED NOT DETECTED Final   Pseudomonas aeruginosa NOT DETECTED NOT DETECTED Final   Candida albicans NOT DETECTED NOT DETECTED Final   Candida glabrata NOT DETECTED NOT DETECTED Final   Candida krusei NOT DETECTED NOT DETECTED Final   Candida parapsilosis NOT DETECTED NOT DETECTED Final   Candida tropicalis NOT DETECTED NOT DETECTED Final    Comment: Performed at [facility], [address], [place], [place] [address]  MRSA PCR Screening     Status: None   Collection Time: 07/12/17  5:26 AM  Result Value Ref Range Status   MRSA by PCR NEGATIVE NEGATIVE Final    Comment:        The GeneXpert MRSA Assay (FDA approved for NASAL specimens only), is one component of a comprehensive MRSA  colonization surveillance program. It is not intended to diagnose MRSA infection nor to guide or monitor treatment for MRSA infections. Performed at [facility], [address], [place], [place] [address]   Culture, blood (routine x 2)     Status: None (Preliminary result)   Collection Time: 07/12/17  7:58 AM  Result Value Ref Range Status   Specimen Description BLOOD LEFT HAND  Final   Special Requests  Final    BOTTLES DRAWN AEROBIC ONLY Blood Culture results may not be optimal due to an inadequate volume of blood received in culture bottles   Culture   Final    NO GROWTH 4 DAYS Performed at [facility], [address], [place], [place] [address]    Report Status PENDING  Incomplete  Surgical pcr screen     Status: None   Collection Time: 07/15/17 12:16 AM  Result Value Ref Range Status   MRSA, PCR NEGATIVE NEGATIVE Final   Staphylococcus aureus NEGATIVE NEGATIVE Final    Comment: (NOTE) The Xpert SA Assay (FDA approved for NASAL specimens in patients 37 years of age and older), is one component of a comprehensive surveillance program. It is not intended to diagnose infection nor to guide or monitor treatment. Performed at [facility], [address], [place], [place] [address]          Radiology Studies: No results found.      Scheduled Meds: . dicyclomine  10 mg Oral BID WC  . heparin  5,000 Units Subcutaneous Q8H  . insulin aspart  0-9 Units Subcutaneous TID WC  . insulin glargine  8 Units Subcutaneous QHS  . iron polysaccharides  150 mg Oral BID  . multivitamin  1 tablet Oral Daily  . pantoprazole  40 mg Oral BID WC  . sevelamer carbonate  1,600 mg Oral TID WC   Continuous Infusions: . sodium chloride 10 mL/hr at 07/15/17 0755  . sodium chloride    . cefTRIAXone (ROCEPHIN)  IV Stopped (07/16/17 1830)     LOS: 5 days     [name], MD [facility] If 7PM-7AM, please contact  night-coverage [email or web address] Password [email or web address] 07/17/2017, 11:42 AM

## 2017-07-17 NOTE — Progress Notes (Signed)
   Daily Progress Note   Assessment/Planning:   POD #2 s/p R BKA   R BKA stump looks viable  PT/OT/PMR evaluation pending  Ok to tsfr to SNF from my viewpoint  LLE arterial exam did not demonstrate significant stenoses in L leg.  ABI was [facility].  Will watch L foot for now  Follow in the office in 4 weeks for staple removal   Subjective  - 2 Days Post-Op   Pain controlled   Objective   Vitals:   07/16/17 1657 07/16/17 1949 07/17/17 0103 07/17/17 0400  BP:  (!) 155/60 (!) 156/62 (!) 168/72  Pulse:  77 80 60  Resp: 15 18 (!) 27 (!) 22  Temp:  98.5 F (36.9 C)  98.8 F (37.1 C)  TempSrc:  Oral  Oral  SpO2: 97% 93% 94% 96%  Weight:    122 lb 2.2 oz (55.4 kg)  Height:         Intake/Output Summary (Last 24 hours) at 07/17/2017 0714 Last data filed at 07/16/2017 1513 Gross per 24 hour  Intake 240 ml  Output 0 ml  Net 240 ml   R BKA: inc c/d/i, staples in place, no active bleeding  Laboratory   CBC CBC Latest Ref Rng & Units 07/17/2017 07/16/2017 07/15/2017  WBC 4.0 - 10.5 K/uL 11.2(H) 12.6(H) -  Hemoglobin 13.0 - 17.0 g/dL 7.6(H) 6.0(V) 8.9(L)  Hematocrit 39.0 - 52.0 % 28.4(L) 29.8(L) 28.9(L)  Platelets 150 - 400 K/uL 202 184 -    BMET    Component Value Date/Time   NA 134 (L) 07/17/2017 0409   NA 138 10/15/2011 1339   K 4.1 07/17/2017 0409   K 4.5 11/28/2011 1340   CL 97 (L) 07/17/2017 0409   CL 104 10/15/2011 1339   CO2 28 07/17/2017 0409   CO2 24 10/15/2011 1339   GLUCOSE 120 (H) 07/17/2017 0409   GLUCOSE 121 (H) 10/15/2011 1339   BUN 24 (H) 07/17/2017 0409   BUN 47 (H) 10/15/2011 1339   CREATININE 5.35 (H) 07/17/2017 0409   CREATININE 3.88 (H) 10/15/2011 1339   CALCIUM 7.0 (L) 07/17/2017 0409   CALCIUM 8.1 (L) 10/15/2011 1339   GFRNONAA 9 (L) 07/17/2017 0409   GFRNONAA 14 (L) 10/15/2011 1339   GFRAA 11 (L) 07/17/2017 0409   GFRAA 17 (L) 10/15/2011 1339     [name], MD, FACS [facility] Office:  [phone] Pager: [phone]  07/17/2017, 7:14 AM

## 2017-07-17 NOTE — Progress Notes (Signed)
Inpatient Rehabilitation-Admissions Coordinator   Met with patient and family along with his friend Pensions consultant) on speakerphone to discuss DC disposition required for CIR. Answered questions about rehab venues. Decision made to admit pt to inpatient rehab today. Insurance has given approval. Dr Rodena Piety was contacted for DC. RN case Freight forwarder and SW will be made aware of CIR admit. Bed available and Pt will be admitted today. Call with questions.   Jhonnie Garner, OTR/L  Rehab Admissions Coordinator  [phone] 07/17/2017 5:44 PM

## 2017-07-17 NOTE — PMR Pre-admission (Addendum)
PMR Admission Coordinator Pre-Admission Assessment  Patient: Charles Vasquez is an 82 y.o., male MRN: [id number] DOB: 11-15-1935 Height: 5\' 7"  (170.2 cm) Weight: 55.4 kg (122 lb 2.2 oz)              Insurance Information HMO: YES  PPO:      PCP:      IPA:      80/20:      OTHER: AARP Medicare Complete  PRIMARY: UHC Medicare      Policy#: [id number]      Subscriber: Patient CM Name: Charles Vasquez      Phone#: [phone]     Fax#: [phone] Pre-Cert#: [id number]    Cert for 7 days f/u with [name]  Employer:  Benefits:  Phone #: [phone]     Name: Online  Eff. Date: 06/09/17     Deduct: $0      Out of Pocket Max: $4,400      Life Max: NA CIR: $345/day for days 1-5; $0/day for 6+      SNF: $0/day for days 1-20, $160/day for days 21-48, $0/day for 49-100 (SNF day limit: 100 days).  Outpatient: with medical necessity     Co-Pay: $40/visit Home Health: 100%     Co-Pay: none DME: 80%     Co-Pay: 20% Providers: In network  SECONDARY: None        Medicaid Application Date:       Case Manager:  Disability Application Date:       Case Worker:   Emergency Contact Information Contact Information    Name Relation Home Work Mobile   Morrow Friend [phone]  [phone]   [name], (sister) Sister   [phone]     Current Medical History  Patient Admitting Diagnosis: Right BKA nontraumatic patient with peripheral vascular disease History of Present Illness: Charles Vasquez is a 82 year old right-handed male with history of hypertension, CAD/MI end-stage renal disease with hemodialysis Monday Wednesdays and Fridays at [facility] dialysis center in [facility], diabetes mellitus.  Patient lives alone.  He has a girlfriend that checks on him daily and a sister in the area.  One level home.  Presented 07/12/2017 with chronic right wound infection progressive ischemic changes.  Patient with recent right lower extremity angiogram with attempted revascularization of right posterior  vertebral artery was unsuccessful 5 weeks ago.  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.  Hospital course pain management.  Hemodialysis ongoing as per renal services.  Subcutaneous heparin for DVT prophylaxis.  Acute on chronic anemia 8.9 and received 2 units packed red blood cells and monitored.  Blood cultures with Proteus/strep currently maintained on Rocephin.  Follow-up of left lower extremity arterial duplex completed showing mild plaque noted throughout no evidence of significant stenosis.  Physical and occupational therapy evaluations completed with recommendations of physical medicine rehab consult.  Patient is to be admitted for a comprehensive rehab program       Past Medical History  Past Medical History:  Diagnosis Date  . Anal pain   . AVF (arteriovenous fistula) (HCC) 05-13-2017 per pt currently AVF access used is left thigh   hx multiple AVF surgery's (previously left upper arm, bilateral thigh) last surgery -- right upper arm creation 11-26-2016  . ESRD (end stage renal disease) on dialysis [facility])    [facility] in [place], [place] on MWF   . History of CVA (cerebrovascular accident)    05-13-2017  per pt stated "  thats what they told yrs ago"  per pt no residual  . Hyperlipidemia   . Hypertension   . Myocardial infarction (HCC)   . Stroke (HCC)   . Type 2 diabetes mellitus treated with insulin (HCC)     Family History  family history includes AAA (abdominal aortic aneurysm) in his mother; Diabetes in his father and mother; Hypertension in his father and mother.  Prior Rehab/Hospitalizations:  Has the patient had major surgery during 100 days prior to admission? Yes  Current Medications   Current Facility-Administered Medications:  .  0.9 %  sodium chloride infusion, , Intravenous, Continuous, [name], PA-C, Last Rate: 10 mL/hr at 07/15/17 0755 .  0.9 %  sodium chloride infusion, , Intravenous, Once, [name],  MD .  acetaminophen (TYLENOL) tablet 650 mg, 650 mg, Oral, Q6H PRN, 650 mg at 07/12/17 0836 **OR** acetaminophen (TYLENOL) suppository 650 mg, 650 mg, Rectal, Q6H PRN, [name], [name], PA-C .  alum & mag hydroxide-simeth (MAALOX/MYLANTA) 200-200-20 MG/5ML suspension 15-30 mL, 15-30 mL, Oral, Q2H PRN, [name], PA-C .  calcium carbonate (TUMS - dosed in mg elemental calcium) chewable tablet 200 mg of elemental calcium, 1 tablet, Oral, Daily PRN, [name], PA-C .  cefTRIAXone (ROCEPHIN) 2 g in sodium chloride 0.9 % 100 mL IVPB, 2 g, Intravenous, Q24H, [name], MD, Last Rate: 200 mL/hr at 07/17/17 1627, 2 g at 07/17/17 1627 .  dicyclomine (BENTYL) capsule 10 mg, 10 mg, Oral, BID WC, [name], [name], PA-C, 10 mg at 07/17/17 1243 .  guaiFENesin-dextromethorphan (ROBITUSSIN DM) 100-10 MG/5ML syrup 15 mL, 15 mL, Oral, Q4H PRN, [name], PA-C .  heparin injection 5,000 Units, 5,000 Units, Subcutaneous, Q8H, [name], PA-C, 5,000 Units at 07/17/17 1630 .  hydrALAZINE (APRESOLINE) injection 5 mg, 5 mg, Intravenous, Q2H PRN, [name], PA-C, 5 mg at 07/13/17 1906 .  insulin aspart (novoLOG) injection 0-9 Units, 0-9 Units, Subcutaneous, TID WC, [name], PA-C, 3 Units at 07/16/17 [phone] .  insulin glargine (LANTUS) injection 8 Units, 8 Units, Subcutaneous, QHS, [name], MD, 8 Units at 07/16/17 2139 .  iron polysaccharides (NIFEREX) capsule 150 mg, 150 mg, Oral, BID, [name], PA-C, 150 mg at 07/17/17 1031 .  labetalol (NORMODYNE,TRANDATE) injection 10 mg, 10 mg, Intravenous, Q10 min PRN, [name], PA-C .  loperamide (IMODIUM) capsule 2 mg, 2 mg, Oral, Q8H PRN, [name], PA-C, 2 mg at 07/12/17 1328 .  metoprolol tartrate (LOPRESSOR) injection 2-5 mg, 2-5 mg, Intravenous, Q2H PRN, [name], PA-C, 5 mg at 07/17/17 0432 .  morphine 4 MG/ML injection 4 mg, 4 mg, Intravenous, Q2H PRN, [name], PA-C, 4 mg at  07/15/17 1952 .  multivitamin (RENA-VIT) tablet 1 tablet, 1 tablet, Oral, Daily, [name], PA-C, 1 tablet at 07/17/17 1031 .  ondansetron (ZOFRAN) tablet 4 mg, 4 mg, Oral, Q6H PRN **OR** ondansetron (ZOFRAN) injection 4 mg, 4 mg, Intravenous, Q6H PRN, [name], [name], PA-C .  pantoprazole (PROTONIX) EC tablet 40 mg, 40 mg, Oral, BID WC, [name], [name], PA-C, 40 mg at 07/17/17 1630 .  phenol (CHLORASEPTIC) mouth spray 1 spray, 1 spray, Mouth/Throat, PRN, [name], [name], PA-C .  polyvinyl alcohol (LIQUIFILM TEARS) 1.4 % ophthalmic solution 1 drop, 1 drop, Both Eyes, TID PRN, [name], [name], PA-C .  senna-docusate (Senokot-S) tablet 1 tablet, 1 tablet, Oral, QHS PRN, [name], PA-C .  sevelamer carbonate (RENVELA) tablet 1,600 mg, 1,600 mg, Oral, TID WC, [name], [name], PA-C, 1,600 mg at 07/17/17 1630 .  traMADol (ULTRAM) tablet 50 mg, 50 mg, Oral, Q6H PRN, [name], PA-C, 50 mg at 07/17/17 1031  Patients Current Diet:  Diet Order           Diet renal/carb modified with fluid restriction Diet-HS Snack? Nothing; Fluid restriction: 1200 mL Fluid; Room service appropriate? Yes; Fluid consistency: Thin  Diet effective now          Precautions / Restrictions Precautions Precautions: Fall Restrictions Weight Bearing Restrictions: Yes RLE Weight Bearing: Non weight bearing   Has the patient had 2 or more falls or a fall with injury in the past year?Yes, pt has had a fall 2 weeks ago per his report and sustained bruises to his back. Pt has reportedly fell 2 times this year.   Prior Activity Level Limited Community (1-2x/wk): (gets out for HD only, using bus for transportation )  Journalist, newspaper / Equipment Home Assistive Devices/Equipment: Environmental consultant (specify type)(front wheel) Home Equipment: Dan Humphreys - 2 wheels, [place] - single point. Tub bench  Prior Device Use: Indicate devices/aids used by the patient prior to current illness, exacerbation or injury? Walker  and has a single point cane  Prior Functional Level Prior Function Level of Independence: Independent with assistive device(s) Comments: Uses walker/tub bench. Girlfriend does grocery shopping.  Self Care: Did the patient need help bathing, dressing, using the toilet or eating?  Needed some help; per pt he was Mod I for dressing, toileting and feeding but had assistance from his friend for bathing 3x/week (pt unable to accurately clarify level of assistance during bathing process)  Indoor Mobility: Did the patient need assistance with walking from room to room (with or without device)? Independent  Stairs: Did the patient need assistance with internal or external stairs (with or without device)? Independent  Functional Cognition: Did the patient need help planning regular tasks such as shopping or remembering to take medications? Needed some help  Current Functional Level Cognition  Overall Cognitive Status: No family/caregiver present to determine baseline cognitive functioning Orientation Level: Oriented X4 General Comments: Poor carry over from session this AM    Extremity Assessment (includes Sensation/Coordination)  Upper Extremity Assessment: Generalized weakness, LUE deficits/detail, RUE deficits/detail RUE Deficits / Details: grasp 4/5, prior amputation of index finger and necrotic RUE Sensation: history of peripheral neuropathy LUE Deficits / Details: Pt limited to 30 degreed FF at shoulder, and 45 degrees shoulder abduction (limited by pain) elbow, wrist, digits WFL LUE: Unable to fully assess due to pain LUE Sensation: history of peripheral neuropathy LUE Coordination: decreased gross motor  Lower Extremity Assessment: Defer to PT evaluation RLE Deficits / Details: BKA, Able to perform straight leg raise, knee AROM 5-75 degrees    ADLs  Overall ADL's : Needs assistance/impaired Eating/Feeding: Modified independent, Sitting Eating/Feeding Details (indicate cue type and  reason): able to use RUE for self-feeding Grooming: Wash/dry face, Set up, Sitting, Oral care Grooming Details (indicate cue type and reason): in recliner, provided with items, and Pt able to complete Upper Body Bathing: Moderate assistance, Sitting Lower Body Bathing: Moderate assistance, Sitting/lateral leans Upper Body Dressing : Moderate assistance, Sitting Lower Body Dressing: Maximal assistance, Sitting/lateral leans Lower Body Dressing Details (indicate cue type and reason): Pt unable to reach LLE to don sock Toilet Transfer: Moderate assistance, Requires drop arm Toilet Transfer Details (indicate cue type and reason): wil benefit from drop arm BSC Toileting- Clothing Manipulation and Hygiene: Maximal assistance, Bed level Toileting - Clothing Manipulation Details (indicate cue type and reason): for rear peri care  Functional mobility during ADLs: Moderate assistance(lateral scoot transfer) General ADL Comments: Pt with very limited use of LUE during functional ADL tasks    Mobility  Overal bed mobility: Needs Assistance Bed Mobility: Sit to Supine Supine to sit: Min assist Sit to supine: Min guard General bed mobility comments: Assist for safety and lines    Transfers  Overall transfer level: Needs assistance Equipment used: None Transfer via Lift Equipment: Stedy Transfers: Lateral/Scoot Transfers Sit to Stand: Max assist, From elevated surface  Lateral/Scoot Transfers: Max Engineer, petroleum transfer comment: Assist to scoot laterally from chair to bed. Pt needed verbal/tactile cues for technique. Used bed pad to facilitate scooting hips    Ambulation / Gait / Stairs / Wheelchair Mobility  Ambulation/Gait General Gait Details: Unable    Posture / Balance Dynamic Sitting Balance Sitting balance - Comments: able to sit forward and back in recliner with use of chair arms Balance Overall balance assessment: Needs assistance Sitting-balance support: No upper extremity  supported, Feet supported Sitting balance-Leahy Scale: Fair Sitting balance - Comments: able to sit forward and back in recliner with use of chair arms Standing balance-Leahy Scale: Zero    Special needs/care consideration BiPAP/CPAP: No CPM: No Continuous Drip IV:  Dialysis: HD from Fem AV graft on LLE       Days: Mon/Wed/Fri Life Vest: No Oxygen: No Special Bed: No Trach Size: No Wound Vac (area): No      Location: NA Skin: Surgical incision on RLE for BKA  ; stage 1 sacrum                          Bowel mgmt:07/17/17 Incontinent  Bladder mgmt:Incontinent  Diabetic mgmt: Yes     Previous Home Environment Living Arrangements: Alone Available Help at Discharge: Friend(s), Available PRN/intermittently Type of Home: House Home Layout: One level Home Access: Level entry Bathroom Shower/Tub: Engineer, manufacturing systems: Handicapped height Home Care Services: No  Discharge Living Setting Plans for Discharge Living Setting: Patient's home Type of Home at Discharge: House Discharge Home Layout: One level Discharge Home Access: Level entry Discharge Bathroom Shower/Tub: Tub/shower unit Discharge Bathroom Toilet: Handicapped height Discharge Bathroom Accessibility: Yes(Accessible only with RW, not wc accessible due to width ) How Accessible: Accessible via walker Does the patient have any problems obtaining your medications?: Yes (Describe)(girlfriend obtains due to transportation issues)  Social/Family/Support Systems Patient Roles: Other (Comment)(brother and friend) Contact Information: (sister vs girlfriend) Anticipated Caregiver: (sister vs girlfriend) Anticipated Caregiver's Contact Information: sister is [name]: [phone]; Salvadore Dom is Delores: [phone] Ability/Limitations of Caregiver: girlfriend is 25 yo with need for pt to be Independent assist level Caregiver Availability: Other (Comment)(sister works Systems developer; Psychiatric nurse from [employer]) Discharge Plan Discussed  with Primary Caregiver: Yes Is Caregiver In Agreement with Plan?: Yes Does Caregiver/Family have Issues with Lodging/Transportation while Pt is in Rehab?: No Girlfriend Chiropodist) is unable to assist at this time; Sister Special educational needs teacher) and brother willing to provide intermittent supervision and assistance needed  Goals/Additional Needs Patient/Family Goal for Rehab: PT/OT Supervison to Mod I  Expected length of stay: 7-10 days Cultural Considerations: NA Dietary Needs: Renal/Card Modified with fluid restrctions: 1200 mL fluid; Fluids: thin consistency  Equipment Needs: TBD Special Service Needs: HD on MWF from femoral AV graft on LLE Pt/Family Agrees to Admission and willing to participate: Yes Program Orientation Provided & Reviewed with Pt/Caregiver Including Roles  & Responsibilities: Yes(pt, sister, and girlfriend ) Additional Information Needs: (Gf/sister inerested in [facility] services  and what that entails. ) Information Needs to be Provided By: SW prior to DC   Barriers to Discharge: Decreased caregiver support, Home environment access/layout, Lack of/limited family support, Hemodialysis  Barriers to Discharge Comments: social support (gf is 70 yo) and sister lives approx 30 mins away and works 5-9pm as a Comptroller   Decrease burden of Care through IP rehab admission: N/A   Possible need for SNF placement upon discharge:not anticipated, discussed with family (gf, sister, and brother) that insurance is unlikely to admit pt to SNF after CIR stay; all indicated verbal understanding.    Patient Condition: This patient's condition remains as documented in the consult dated 07/16/17, in which the Rehabilitation Physician determined and documented that the patient's condition is appropriate for intensive rehabilitative care in an inpatient rehabilitation facility. Will admit to inpatient rehab today.  Preadmission Screen Completed By:  Nanine Means, 07/17/2017 5:30  PM ______________________________________________________________________   Discussed status with Dr. Wynn Banker on 07/17/17 at 5:26pm and received telephone approval for admission today.  Admission Coordinator:  Nanine Means, time 5:26pm/Date 07/17/17

## 2017-07-17 NOTE — Progress Notes (Signed)
  [facility] Progress Note    Assessment/ Plan:   82 yo male with ESRD on MWF HD, HTN, HLD, DM, stroke, GERD, CAD, PVD who presented with a R foot infection now s/p R BKA. 1. ESRD on MWF HD. HD yesterday, next tomorrow. Continue home renvela. K 4.1 2. 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 Narrowed to ceftriaxone per primary. 3. HTN. BPs improved, restart losartan  4. Anemia. s/p 2 U RBC post op. Hgb stable at 8.9 compared to baseline 10-11. Continue home iron supplementation.  5. Diabetes per primary  Subjective:   Feels well today. Pain is under good control. No complaints   Objective:   BP (!) 168/72 (BP Location: Left Arm)   Pulse 60   Temp 98.8 F (37.1 C) (Oral)   Resp (!) 22   Ht 5\' 7"  (1.702 m)   Wt 122 lb 2.2 oz (55.4 kg)   SpO2 96%   BMI 19.13 kg/m   Intake/Output Summary (Last 24 hours) at 07/17/2017 0730 Last data filed at 07/16/2017 1513 Gross per 24 hour  Intake 240 ml  Output 0 ml  Net 240 ml   Weight change: 0 lb (0 kg)  Physical Exam: Gen: laying in bed, in NAD, awake and alert CVS: regular rate and rhythm, normal S1 and S2, no murmurs Resp:CTAB, normal effort on room air Abd: soft, nontender, nondistended Ext: R LE s/p BKA with dressing c/d/i, nontender to palpation  Imaging: No results found.  Labs: BMET Recent Labs  Lab 07/11/17 1754 07/13/17 0248 07/15/17 0418 07/15/17 1036 07/16/17 0351 07/17/17 0409  NA 133* 134* 132* 135 133* 134*  K 4.6 5.0 4.7 4.3 5.1 4.1  CL 92* 94* 93*  --  98* 97*  CO2 27 25 26   --  22 28  GLUCOSE 387* 248* 291* 267* 226* 120*  BUN 29* 50* 33*  --  47* 24*  CREATININE 5.43* 8.23* 6.69*  --  8.31* 5.35*  CALCIUM 7.5* 7.5* 7.3*  --  6.9* 7.0*   CBC Recent Labs  Lab 07/11/17 1754 07/13/17 0248 07/15/17 0418 07/15/17 1036 07/15/17 1620 07/16/17 0351 07/17/17 0409  WBC 15.6* 18.5* 16.4*  --   --  12.6* 11.2*  NEUTROABS 13.6* 16.7*  --   --   --   --  9.0*  HGB 10.1* 9.7* 9.0* 7.8* 8.9*  9.4* 8.9*  HCT 33.0* 31.4* 30.1* 23.0* 28.9* 29.8* 28.4*  MCV 86.2 86.0 86.7  --   --  85.9 87.4  PLT 202 222 236  --   --  184 202    Medications:    . dicyclomine  10 mg Oral BID WC  . heparin  5,000 Units Subcutaneous Q8H  . insulin aspart  0-9 Units Subcutaneous TID WC  . insulin glargine  8 Units Subcutaneous QHS  . iron polysaccharides  150 mg Oral BID  . multivitamin  1 tablet Oral Daily  . pantoprazole  40 mg Oral BID WC  . sevelamer carbonate  1,600 mg Oral TID WC      Leland Her, DO PGY-2, [facility] 07/17/2017 7:30 AM

## 2017-07-17 NOTE — Progress Notes (Signed)
Received pt. As a new admission,pt. Was oriented to the unit routine.

## 2017-07-17 NOTE — Progress Notes (Signed)
I contacted Charles Vasquez, in hemodialysis of plan to d/c to CIR today for planning hemodialysis timing for tomorrow. [phone]

## 2017-07-17 NOTE — Progress Notes (Deleted)
Kirsteins, Victorino Sparrow, MD      Wynn Banker Victorino Sparrow, MD  Physician  Physical Medicine and Rehabilitation      Consult Note  Signed     Date of Service:  07/16/2017  5:59 AM         Related encounter: ED to Hosp-Admission (Current) from 07/11/2017 in [facility] 4E CV SURGICAL PROGRESSIVE CARE             Signed          Expand All Collapse All            Expand widget buttonCollapse widget button                   customization button                                                                                                                                                                                  untitled image              Physical Medicine and Rehabilitation Consult  Reason for Consult: Decreased functional mobility  Referring Physician: [facility]        HPI: Charles Vasquez is a 82 y.o. right-handed male with history of hypertension, end-stage renal disease with hemodialysis Monday Wednesday Friday at [facility], diabetes mellitus.  Per chart review patient lives alone.  He has a girlfriend that checks on him daily and a sister in the area.  Question assistance on discharge.  One level home.  Presented 07/12/2017 with chronic right wound infection and progressive ischemic changes.  Patient with recent right lower extremity angiogram with attempted revascularization of right posterior tibial artery that was unsuccessful 5 weeks ago.  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.  Hospital course pain management.  Hemodialysis ongoing as per renal services.  Subcutaneous heparin for DVT prophylaxis.  Acute on chronic anemia 9.4.  Physical and occupational therapy evaluations pending.  MD has  requested physical medicine rehab consult.     Patient is asking why his right lower extremity is so stiff.  Reminded him that he had surgery yesterday.  Received tramadol this morning for pain.  Just got taken off of dialysis.  Has a femoral AV graft on the left side     Review of Systems   Constitutional: Negative for chills and fever.   HENT: Negative for hearing loss.    Respiratory: Negative for cough.         Occasional shortness of breath with exertion   Cardiovascular: Positive for leg swelling. Negative for chest pain and palpitations.  Gastrointestinal: Positive for constipation. Negative for nausea and vomiting.   Genitourinary: Negative for hematuria.   Musculoskeletal: Positive for joint pain and myalgias.   Skin: Negative for rash.   All other systems reviewed and are negative.          Past Medical History:    Diagnosis   Date    .   Anal pain        .   AVF (arteriovenous fistula) (HCC)   05-13-2017 per pt currently AVF access used is left thigh        hx multiple AVF surgery's (previously left upper arm, bilateral thigh) last surgery -- right upper arm creation 11-26-2016    .   ESRD (end stage renal disease) on dialysis [facility])            [facility] in [place], [place] on MWF     .   History of CVA (cerebrovascular accident)            05-13-2017  per pt stated "thats what they told yrs ago"  per pt no residual    .   Hyperlipidemia        .   Hypertension        .   Myocardial infarction (HCC)        .   Stroke (HCC)        .   Type 2 diabetes mellitus treated with insulin [facility])                 Past Surgical History:    Procedure   Laterality   Date    .   ABDOMINAL AORTOGRAM W/LOWER EXTREMITY   N/A   06/25/2017        Procedure: ABDOMINAL AORTOGRAM W/LOWER EXTREMITY;  Surgeon: Maeola Harman, MD;  Location: [facility] INVASIVE CV LAB;  Service:  Cardiovascular;  Laterality: N/A;    .   AMPUTATION   Right   01/01/2017        Procedure: REVISION AMPUTATION RIGHT INDEX FINGER;  Surgeon: Dairl Ponder, MD;  Location: [facility] OR;  Service: Orthopedics;  Laterality: Right;    .   ARTERIOVENOUS GRAFT PLACEMENT            .   ARTERIOVENOUS GRAFT PLACEMENT   Left   10/22/2016        thigh    .   AV FISTULA PLACEMENT       03/31/2012        Procedure: ARTERIOVENOUS (AV) FISTULA CREATION;  Surgeon: [name], MD;  Location: [facility] OR;  Service: Vascular;  Laterality: Right;  First stage Brachial vein transposition    .   AV FISTULA PLACEMENT   Right   08/25/2012        Procedure: INSERTION OF ARTERIOVENOUS (AV) GORE-TEX GRAFT ARM;  Surgeon: [name], MD;  Location: [facility] OR;  Service: Vascular;  Laterality: Right;    .   AV FISTULA PLACEMENT   Left   07/23/2016        Procedure: INSERTION OF ARTERIOVENOUS (AV) GORE-TEX GRAFT LEFT UPPER ARM;  Surgeon: [name], MD;  Location: [facility] OR;  Service: Vascular;  Laterality: Left;    .   AV FISTULA PLACEMENT   Left   10/22/2016        Procedure: INSERTION OF 4-45mm x 45cm  ARTERIOVENOUS (AV) GORE-TEX GRAFT THIGH-LEFT;  Surgeon: [name], MD;  Location: [facility] OR;  Service: Vascular;  Laterality:  Left;    .   CATARACT EXTRACTION W/ INTRAOCULAR LENS  IMPLANT, BILATERAL            .   COLONOSCOPY   N/A   03/25/2017        Dr. Darrick Penna: examined portion of ileum normal. Redundant left colon, stricture at anus     .   ESOPHAGOGASTRODUODENOSCOPY   N/A   03/25/2017        Dr. Darrick Penna: widely patent Schatzki ring at GE junction s/p dilation, atrophic gastritis, single duodenal AVM, non-bleeding    .   INSERTION OF DIALYSIS CATHETER   Left   05/05/2012        Procedure: INSERTION OF DIALYSIS CATHETER;  Surgeon: Chuck Hint, MD;  Location: [facility] OR;  Service: Vascular;  Laterality: Left;    .    INSERTION OF DIALYSIS CATHETER   Right   10/01/2016        Procedure: INSERTION OF DIALYSIS CATHETER- RIGHT FEMORAL;  Surgeon: [name], MD;  Location: [facility] OR;  Service: Vascular;  Laterality: Right;    .   IR FLUORO GUIDE CV LINE RIGHT       06/10/2016    .   IR GENERIC HISTORICAL       06/07/2016        IR US GUIDE VASC ACCESS RIGHT 06/07/2016 Oley Balm, MD [facility]-INTERV RAD    .   IR THROMBECTOMY AV FISTULA W/THROMBOLYSIS/PTA INC/SHUNT/IMG RIGHT   Right   06/07/2016    .   IR US GUIDE VASC ACCESS RIGHT       06/10/2016    .   LIGATION ARTERIOVENOUS GORTEX GRAFT   Left   08/04/2016        Procedure: LIGATION ARTERIOVENOUS GORTEX GRAFT;  Surgeon: Larina Earthly, MD;  Location: [facility] OR;  Service: Vascular;  Laterality: Left;    .   LIGATION ARTERIOVENOUS GORTEX GRAFT   Right   11/26/2016        Procedure: LIGATION OF RIGHT UPPER ARM ARTERIOVENOUS GORTEX GRAFT;  Surgeon: [name], MD;  Location: [facility] OR;  Service: Vascular;  Laterality: Right;    .   LIGATION OF ARTERIOVENOUS  FISTULA   Right   08/25/2012        Procedure: LIGATION OF ARTERIOVENOUS  FISTULA;  Surgeon: [name], MD;  Location: [facility] OR;  Service: Vascular;  Laterality: Right;  Ligation of right brachial vein transposition    .   PERIPHERAL VASCULAR ATHERECTOMY       06/25/2017        Procedure: PERIPHERAL VASCULAR ATHERECTOMY;  Surgeon: Maeola Harman, MD;  Location: [facility] INVASIVE CV LAB;  Service: Cardiovascular;;  Rt. PT    .   REMOVAL OF A DIALYSIS CATHETER   Right   05/05/2012        Procedure: REMOVAL OF A DIALYSIS CATHETER;  Surgeon: Chuck Hint, MD;  Location: [facility] OR;  Service: Vascular;  Laterality: Right;    .   REMOVAL OF A DIALYSIS CATHETER   Right   10/01/2016        Procedure: REMOVAL OF A DIALYSIS CATHETER-RIGHT UPPER CHEST;  Surgeon: [name], MD;  Location: [facility] OR;  Service: Vascular;   Laterality: Right;    .   REMOVAL OF A DIALYSIS CATHETER   Right   11/26/2016        Procedure: REMOVAL OF RIGHT FEMORAL TUNNEL DIALYSIS CATHETER;  Surgeon: [name], MD;  Location: [facility] OR;  Service: Vascular;  Laterality: Right;    .   SPHINCTEROTOMY   N/A   05/15/2017        Procedure: POSSIBLE SPHINCTEROTOMY (BOTOX);  Surgeon: Romie Levee, MD;  Location: [facility];  Service: General;  Laterality: N/A;    .   TOE AMPUTATION       02/2007        left foot first 3 toes    .   UPPER EXTREMITY VENOGRAPHY   Bilateral   07/18/2016        Procedure: Bilateral Upper Extremity Venography;  Surgeon: [name], MD;  Location: [facility] INVASIVE CV LAB;  Service: Cardiovascular;  Laterality: Bilateral;             Family History    Problem   Relation   Age of Onset    .   Diabetes   Mother        .   Hypertension   Mother        .   AAA (abdominal aortic aneurysm)   Mother        .   Diabetes   Father        .   Hypertension   Father           Social History:  reports that he has never smoked. He has never used smokeless tobacco. He reports that he does not drink alcohol or use drugs.  Allergies:         Allergies    Allergen   Reactions    .   Lisinopril   Other (See Comments)            HYPERKALEMIA  RENAL DYSFUNCTION PROGRESSION              Medications Prior to Admission    Medication   Sig   Dispense   Refill    .   calcium carbonate (TUMS - DOSED IN MG ELEMENTAL CALCIUM) 500 MG chewable tablet   Chew 1 tablet by mouth daily as needed for indigestion or heartburn.            .   carboxymethylcellulose (REFRESH PLUS) 0.5 % SOLN   Place 1 drop into both eyes 3 (three) times daily as needed (dry eyes). AS DIRECTED             .   dicyclomine (BENTYL) 10 MG capsule   1 PO 30 MINUTES PRIOR TO BREAKFAST AND LUNCH (Patient  taking differently: Take 10 mg by mouth 2 (two) times daily. )   62 capsule   11    .   insulin glargine (LANTUS) 100 UNIT/ML injection   Inject 5 Units into the skin at bedtime.             Marland Kitchen   losartan (COZAAR) 50 MG tablet   Take 50 mg by mouth every evening.             .   multivitamin (RENA-VIT) TABS tablet   Take 1 tablet by mouth daily.            .   pantoprazole (PROTONIX) 40 MG tablet   1 PO 30 MINUTES PRIOR TO MEALS BID FOR 3 MOS THEN QD (Patient taking differently: Take 40 mg by mouth 2 (two) times daily. )   60 tablet   11    .   Polysacch Fe Cmp-Fe Heme Poly (BIFERA) 28 MG TABS  1 po bid (Patient taking differently: Take 28 mg by mouth 2 (two) times daily. 1 po bid)   60 tablet   11    .   sevelamer carbonate (RENVELA) 800 MG tablet   Take 1,600 mg by mouth 3 (three) times daily with meals. Supposed to take one 800 mg with snacks and caregiver said he only does that sometimes            .   SSD 1 % cream   Apply 1 application topically daily.            .   traMADol (ULTRAM) 50 MG tablet   Take 1 tablet (50 mg total) by mouth every 6 (six) hours as needed.   30 tablet   0          Home:  Home Living  Family/patient expects to be discharged to:: Private residence  Living Arrangements: Alone   Functional History:    Functional Status:   Mobility:             ADL:       Cognition:  Cognition  Orientation Level: Oriented X4       Blood pressure (!) 132/52, pulse 72, temperature 98.4 F (36.9 C), temperature source Oral, resp. rate 18, height 5\' 7"  (1.702 m), weight 57.4 kg (126 lb 8.7 oz), SpO2 97 %.  Physical Exam   Vitals reviewed.  Constitutional: He is oriented to person, place, and time.   HENT:   Head: Normocephalic.   Eyes: EOM are normal.   Neck: Normal range of motion. Neck supple. No thyromegaly present.   Cardiovascular: Normal rate and  regular rhythm.   Respiratory: Effort normal and breath sounds normal. No respiratory distress.   GI: Soft. Bowel sounds are normal. He exhibits no distension.  Neurological: He is alert and oriented to person, place, and time.   Skin:  BKA site is dressed and appropriately tender   Upper extremities have evidence of atrophy in the hand intrinsics.  Left foot with intrinsic atrophy.  Sensation is intact in the upper extremities but reduced below the knee in the left lower extremity.  Right lower extremity not tested does have Ace wrap.  Motor strength is 5/5 bilateral deltoid bicep tricep 4/5 bilateral grip  4/5 bilateral hip flexors patient does have active right knee extension and flexion not tested against resistance secondary to recent surgery, left lower extremity 4/5 in the knee extensors and 3- ankle dorsiflexor plantar flexor     Lab Results Last 24 Hours  Imaging Results (Last 48 hours)             Assessment/Plan:  Diagnosis: Right BKA nontraumatic patient with peripheral vascular disease  1.Does the need for close, 24 hr/day medical supervision in concert with the patient's rehab needs make it unreasonable for this patient to be served in a less intensive setting? Yes   2.Co-Morbidities requiring supervision/potential complications: Diabetes with peripheral neuropathy, acute blood loss anemia, end-stage renal disease with hemodialysis   3.Due to bladder management, bowel management, safety, skin/wound care, disease management, medication administration, pain management and patient education, does the patient require 24 hr/day rehab nursing? Yes   4.Does the patient require coordinated care of a physician, rehab nurse, PT (1-2 hrs/day, 5 days/week) and OT (1-2 hrs/day, 5 days/week) to address physical and functional deficits in the context of the above medical diagnosis(es)? Yes Addressing deficits in the following areas: balance, endurance, locomotion, strength, transferring, bowel/bladder control, bathing, dressing, grooming, toileting, cognition and psychosocial support   5.Can the patient actively participate in an intensive therapy program of at least 3 hrs of therapy per day at least 5 days per week? Should be  able to in 1 to 2 days   6.The potential for patient to make measurable gains while on inpatient rehab is good   7.Anticipated functional outcomes upon discharge from inpatient rehab are modified independent and supervision  with PT, modified independent and supervision with OT, n/a with SLP.   8.Estimated rehab length of stay to reach the above functional goals is: 7 to 10 days   9.Anticipated D/C setting: Home   10.Anticipated post D/C treatments: HH therapy   11.Overall Rehab/Functional Prognosis: good      RECOMMENDATIONS:  This patient's condition is appropriate for continued rehabilitative care in the following setting: CIR  Patient has agreed to participate in recommended program. Yes  Note that insurance prior authorization may be required for reimbursement for recommended care.     Comment:      Erick Colace M.D.  [facility]  FAAPM&R (Sports Med, Neuromuscular Med)  Diplomate Am Board of Electrodiagnostic Med     Lynnae Prude  07/16/2017                Revision History                                        Routing History

## 2017-07-17 NOTE — Progress Notes (Signed)
Physical Therapy Treatment Patient Details Name: Charles Vasquez MRN: [id number] DOB: Aug 04, 1935 Today's Date: 07/17/2017    History of Present Illness Pt adm with chronic rt foot wound and underwent rt BKA on 07/15/17. PMH - ESRD on HD, DM, CVA, HTN, PVD, CAD    PT Comments    Pt seen for second session to assist with transfer back to bed and for RLE ex's. Pt with poor carryover with transfer technique from AM. Continues to be motivated to work toward more independence.    Follow Up Recommendations  CIR     Equipment Recommendations  Wheelchair (measurements PT);Wheelchair cushion (measurements PT)    Recommendations for Other Services       Precautions / Restrictions Precautions Precautions: Fall    Mobility  Bed Mobility Overal bed mobility: Needs Assistance Bed Mobility: Sit to Supine       Sit to supine: Min guard   General bed mobility comments: Assist for safety and lines  Transfers Overall transfer level: Needs assistance Equipment used: None Transfers: Lateral/Scoot Transfers          Lateral/Scoot Transfers: Max assist General transfer comment: Assist to scoot laterally from chair to bed. Pt needed verbal/tactile cues for technique. Used bed pad to facilitate scooting hips  Ambulation/Gait             General Gait Details: Unable   Stairs             Wheelchair Mobility    Modified Rankin (Stroke Patients Only)       Balance Overall balance assessment: Needs assistance Sitting-balance support: No upper extremity supported;Feet supported Sitting balance-Leahy Scale: Fair                                      Cognition Arousal/Alertness: Awake/alert Behavior During Therapy: WFL for tasks assessed/performed Overall Cognitive Status: No family/caregiver present to determine baseline cognitive functioning Area of Impairment: Orientation;Memory;Problem solving                 Orientation Level: Time   Memory:  Decreased short-term memory       Problem Solving: Slow processing;Requires verbal cues;Requires tactile cues General Comments: Poor carry over from session this AM      Exercises Amputee Exercises Quad Sets: Right;5 reps;Supine Straight Leg Raises: AROM;Right;5 reps;Supine    General Comments        Pertinent Vitals/Pain Pain Assessment: 0-10 Pain Score: 8  Pain Location: rt residual liimb Pain Descriptors / Indicators: Sore;Aching Pain Intervention(s): Monitored during session;Repositioned    Home Living                      Prior Function            PT Goals (current goals can now be found in the care plan section) Progress towards PT goals: Progressing toward goals    Frequency    Min 3X/week      PT Plan Current plan remains appropriate    Co-evaluation              AM-PAC PT "6 Clicks" Daily Activity  Outcome Measure  Difficulty turning over in bed (including adjusting bedclothes, sheets and blankets)?: A Little Difficulty moving from lying on back to sitting on the side of the bed? : Unable Difficulty sitting down on and standing up from a chair with arms (e.g., wheelchair, bedside commode, etc,.)?:  Unable Help needed moving to and from a bed to chair (including a wheelchair)?: Total Help needed walking in hospital room?: Total Help needed climbing 3-5 steps with a railing? : Total 6 Click Score: 8    End of Session Equipment Utilized During Treatment: Gait belt Activity Tolerance: Patient tolerated treatment well Patient left: with call bell/phone within reach;in bed;with bed alarm set Nurse Communication: Mobility status PT Visit Diagnosis: Other abnormalities of gait and mobility (R26.89);Muscle weakness (generalized) (M62.81);Pain Pain - Right/Left: Right Pain - part of body: Leg     Time: 0258-5277 PT Time Calculation (min) (ACUTE ONLY): 12 min  Charges:  $Therapeutic Activity: 8-22 mins                    G Codes:        [facility] PT [phone]    Angelina Ok [facility] 07/17/2017, 4:40 PM

## 2017-07-17 NOTE — Evaluation (Signed)
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 PMH - ESRD on HD, DM, CVA, HTN, PVD, CAD   Clinical Impression   PTA Pt mod I with RW and tub bench for mobility and ADL. Pt is currently max A for LB ADL, min A to set up for UB ADL. Pt with limited ROM in LUE shoulder, and generalized weakness. Unsure of baseline cognition (Pt with varied reports on prior abilities during session and disoriented to time). Pt is very pleasant, and very motivated to return home to girlfriend as independent as possible. He will benefit from skilled OT in the acute setting as well as afterwards at the CIR level to maximize safety and independence in ADL and functional transfers. Next session to focus on lateral scoots to and from chair (simulating wc level transfers) and toileting.     Follow Up Recommendations  CIR;Supervision/Assistance - 24 hour    Equipment Recommendations  Other (comment)(defer to next venue)    Recommendations for Other Services       Precautions / Restrictions Precautions Precautions: Fall Restrictions Weight Bearing Restrictions: Yes RLE Weight Bearing: Non weight bearing      Mobility Bed Mobility Overal bed mobility: Needs Assistance Bed Mobility: Supine to Sit     Supine to sit: Min assist     General bed mobility comments: Pt sitting OOB in recliner when OT entered  Transfers Overall transfer level: Needs assistance Equipment used: Ambulation equipment used Transfers: Sit to/from Stand;Lateral/Scoot Transfers Sit to Stand: Max assist;From elevated surface        Lateral/Scoot Transfers: Mod assist General transfer comment: NT this session, just performed transfer with PT    Balance Overall balance assessment: Needs assistance Sitting-balance support: No upper extremity supported;Feet  supported Sitting balance-Leahy Scale: Fair Sitting balance - Comments: able to sit forward and back in recliner with use of chair arms     Standing balance-Leahy Scale: Zero                             ADL either performed or assessed with clinical judgement   ADL Overall ADL's : Needs assistance/impaired Eating/Feeding: Modified independent;Sitting Eating/Feeding Details (indicate cue type and reason): able to use RUE for self-feeding Grooming: Wash/dry face;Set up;Sitting;Oral care Grooming Details (indicate cue type and reason): in recliner, provided with items, and Pt able to complete Upper Body Bathing: Moderate assistance;Sitting   Lower Body Bathing: Moderate assistance;Sitting/lateral leans   Upper Body Dressing : Moderate assistance;Sitting   Lower Body Dressing: Maximal assistance;Sitting/lateral leans Lower Body Dressing Details (indicate cue type and reason): Pt unable to reach LLE to don sock Toilet Transfer: Moderate assistance;Requires drop arm Toilet Transfer Details (indicate cue type and reason): wil benefit from drop arm BSC Toileting- Clothing Manipulation and Hygiene: Maximal assistance;Bed level Toileting - Clothing Manipulation Details (indicate cue type and reason): for rear peri care     Functional mobility during ADLs: Moderate assistance(lateral scoot transfer) General ADL Comments: Pt with very limited use of LUE during functional ADL tasks     Vision Patient Visual Report: No change from baseline       Perception     Praxis      Pertinent Vitals/Pain Pain Assessment: 0-10 Pain Score: 8  Pain Location: rt residual liimb Pain Descriptors / Indicators:  Sore;Aching;Burning Pain Intervention(s): Monitored during session;Patient requesting pain meds-RN notified;Limited activity within patient's tolerance     Hand Dominance Right   Extremity/Trunk Assessment Upper Extremity Assessment Upper Extremity Assessment: Generalized  weakness;LUE deficits/detail;RUE deficits/detail RUE Deficits / Details: grasp 4/5, prior amputation of index finger and necrotic RUE Sensation: history of peripheral neuropathy LUE Deficits / Details: Pt limited to 30 degreed FF at shoulder, and 45 degrees shoulder abduction (limited by pain) elbow, wrist, digits WFL LUE: Unable to fully assess due to pain LUE Sensation: history of peripheral neuropathy LUE Coordination: decreased gross motor   Lower Extremity Assessment Lower Extremity Assessment: Defer to PT evaluation RLE Deficits / Details: BKA, Able to perform straight leg raise, knee AROM 5-75 degrees       Communication Communication Communication: No difficulties   Cognition Arousal/Alertness: Awake/alert Behavior During Therapy: WFL for tasks assessed/performed Overall Cognitive Status: No family/caregiver present to determine baseline cognitive functioning Area of Impairment: Orientation                 Orientation Level: Time             General Comments: Pt inconsistent on reports of prior level- when trying to decifer if shoulder has decresed ROM, Pt reporting various levels of ability PTA   General Comments  Educated Pt on de-sensitzation of RLE to assist with pain    Exercises     Shoulder Instructions      Home Living Family/patient expects to be discharged to:: Private residence Living Arrangements: Alone Available Help at Discharge: Friend(s);Available PRN/intermittently Type of Home: House Home Access: Level entry     Home Layout: One level     Bathroom Shower/Tub: Chief Strategy Officer: Handicapped height     Home Equipment: Environmental consultant - 2 wheels;Cane - single point;Tub bench          Prior Functioning/Environment Level of Independence: Independent with assistive device(s)        Comments: Uses walker/tub bench. Girlfriend does grocery shopping.        OT Problem List: Decreased range of motion;Decreased activity  tolerance;Impaired balance (sitting and/or standing);Decreased cognition;Decreased safety awareness;Decreased knowledge of use of DME or AE;Impaired UE functional use;Pain      OT Treatment/Interventions: Self-care/ADL training;Energy conservation;DME and/or AE instruction;Manual therapy;Therapeutic activities;Cognitive remediation/compensation;Patient/family education;Balance training    OT Goals(Current goals can be found in the care plan section) Acute Rehab OT Goals Patient Stated Goal: return home OT Goal Formulation: With patient Time For Goal Achievement: 07/31/17 Potential to Achieve Goals: Good ADL Goals Pt Will Perform Grooming: with modified independence;sitting Pt Will Perform Lower Body Bathing: with supervision;with adaptive equipment;sitting/lateral leans Pt Will Perform Lower Body Dressing: with min assist;sitting/lateral leans Pt Will Transfer to Toilet: with supervision;squat pivot transfer;bedside commode Pt Will Perform Toileting - Clothing Manipulation and hygiene: with supervision;sitting/lateral leans Additional ADL Goal #1: Pt will perform bed mobility at supervision level prior to engaging in ADL activity  OT Frequency: Min 3X/week   Barriers to D/C:            Co-evaluation              AM-PAC PT "6 Clicks" Daily Activity     Outcome Measure Help from another person eating meals?: A Little Help from another person taking care of personal grooming?: A Little Help from another person toileting, which includes using toliet, bedpan, or urinal?: A Lot Help from another person bathing (including washing, rinsing, drying)?: A Lot Help from another person  to put on and taking off regular upper body clothing?: A Little Help from another person to put on and taking off regular lower body clothing?: A Lot 6 Click Score: 15   End of Session Nurse Communication: Mobility status;Patient requests pain meds  Activity Tolerance: Patient limited by pain Patient  left: in chair;with call bell/phone within reach;with family/visitor present;with nursing/sitter in room  OT Visit Diagnosis: Other abnormalities of gait and mobility (R26.89);Muscle weakness (generalized) (M62.81);Pain Pain - Right/Left: Right Pain - part of body: Leg                Time: 1011-1030 OT Time Calculation (min): 19 min Charges:  OT General Charges $OT Visit: 1 Visit OT Evaluation $OT Eval Moderate Complexity: 1 Mod G-Codes:     Sherryl Manges OTR/L [phone]  Charles Vasquez 07/17/2017, 10:51 AM

## 2017-07-17 NOTE — Progress Notes (Deleted)
   Kirsteins, Andrew E, MD      Kirsteins, Andrew E, MD  Physician  Physical Medicine and Rehabilitation      Consult Note  Signed     Date of Service:  07/16/2017  5:59 AM         Related encounter: ED to Hosp-Admission (Current) from 07/11/2017 in [facility] SURGICAL PRO[facility]             Signed          Expand All Collapse All            Expand widget buttonCollapse widget button                   customization button                                                                                                                                                                                  untitled image              Physical Medicine and Rehabilitation Consult  Reason for Consult: Decreased functional mobility  Referring Physician: [facility]        HPI: Joffrey E Mudgett is a 81 y.o. right-handed male with history of hypertension, end-stage renal disease with hemodialysis Monday Wednesday Friday at [facility] [name], diabetes mellitus.  Per chart review patient lives alone.  He has a girlfriend that checks on him daily and a sister in the area.  Question assistance on discharge.  One level home.  Presented 07/12/2017 with chronic right wound infection and progressive ischemic changes.  Patient with recent right lower extremity angiogram with attempted revascularization of right posterior tibial artery that was unsuccessful 5 weeks ago.  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 Chen.  Hospital course pain management.  Hemodialysis ongoing as per renal services.  Subcutaneous heparin for DVT prophylaxis.  Acute on chronic anemia 9.4.  Physical and occupational therapy evaluations pending.  MD has  requested physical medicine rehab consult.     Patient is asking why his right lower extremity is so stiff.  Reminded him that he had surgery yesterday.  Received tramadol this morning for pain.  Just got taken off of dialysis.  Has a femoral AV graft on the left side     Review of Systems   Constitutional: Negative for chills and fever.   HENT: Negative for hearing loss.    Respiratory: Negative for cough.         Occasional shortness of breath with exertion   Cardiovascular: Positive for leg swelling. Negative for chest pain and palpitations.     Gastrointestinal: Positive for constipation. Negative for nausea and vomiting.   Genitourinary: Negative for hematuria.   Musculoskeletal: Positive for joint pain and myalgias.   Skin: Negative for rash.   All other systems reviewed and are negative.          Past Medical History:    Diagnosis   Date    .   Anal pain        .   AVF (arteriovenous fistula) (HCC)   05-13-2017 per pt currently AVF access used is left thigh        hx multiple AVF surgery's (previously left upper arm, bilateral thigh) last surgery -- right upper arm creation 11-26-2016    .   ESRD (end stage renal disease) on dialysis (HCC)            [facility] in [name],  on MWF     .   History of CVA (cerebrovascular accident)            05-13-2017  per pt stated "thats what they told yrs ago"  per pt no residual    .   Hyperlipidemia        .   Hypertension        .   Myocardial infarction (HCC)        .   Stroke (HCC)        .   Type 2 diabetes mellitus treated with insulin (HCC)                 Past Surgical History:    Procedure   Laterality   Date    .   ABDOMINAL AORTOGRAM W/LOWER EXTREMITY   N/A   06/25/2017        Procedure: ABDOMINAL AORTOGRAM W/LOWER EXTREMITY;  Surgeon: Cain, Brandon Christopher, MD;  [facility]: [facility];  Service:  Cardiovascular;  Laterality: N/A;    .   AMPUTATION   Right   01/01/2017        Procedure: REVISION AMPUTATION RIGHT INDEX FINGER;  Surgeon: Weingold, [name], MD;  Location: [facility];  Service: Orthopedics;  Laterality: Right;    .   ARTERIOVENOUS GRAFT PLACEMENT            .   ARTERIOVENOUS GRAFT PLACEMENT   Left   10/22/2016        thigh    .   AV FISTULA PLACEMENT       03/31/2012        Procedure: ARTERIOVENOUS (AV) FISTULA CREATION;  Surgeon: Brian L Chen, MD;  Location: [facility];  Service: Vascular;  Laterality: Right;  First stage Brachial vein transposition    .   AV FISTULA PLACEMENT   Right   08/25/2012        Procedure: INSERTION OF ARTERIOVENOUS (AV) GORE-TEX GRAFT ARM;  Surgeon: Brian L Chen, MD;  Location: [facility];  Service: Vascular;  Laterality: Right;    .   AV FISTULA PLACEMENT   Left   07/23/2016        Procedure: INSERTION OF ARTERIOVENOUS (AV) GORE-TEX GRAFT LEFT UPPER ARM;  Surgeon: Chen, Brian L, MD;  Location: [facility];  Service: Vascular;  Laterality: Left;    .   AV FISTULA PLACEMENT   Left   10/22/2016        Procedure: INSERTION OF 4-7mm x 45cm  ARTERIOVENOUS (AV) GORE-TEX GRAFT THIGH-LEFT;  Surgeon: Chen, Brian L, MD;  Location: [facility];  Service: Vascular;  Laterality:   Left;    .   CATARACT EXTRACTION W/ INTRAOCULAR LENS  IMPLANT, BILATERAL            .   COLONOSCOPY   N/A   03/25/2017        Dr. Fields: examined portion of ileum normal. Redundant left colon, stricture at anus     .   ESOPHAGOGASTRODUODENOSCOPY   N/A   03/25/2017        Dr. Fields: widely patent Schatzki ring at GE junction s/p dilation, atrophic gastritis, single duodenal AVM, non-bleeding    .   INSERTION OF DIALYSIS CATHETER   Left   05/05/2012        Procedure: INSERTION OF DIALYSIS CATHETER;  Surgeon: Christopher S Dickson, MD;  [facility]: [facility];  Service: Vascular;  Laterality: Left;    .    INSERTION OF DIALYSIS CATHETER   Right   10/01/2016        Procedure: INSERTION OF DIALYSIS CATHETER- RIGHT FEMORAL;  Surgeon: Chen, Brian L, MD;  [facility]: [facility];  Service: Vascular;  Laterality: Right;    .   IR FLUORO GUIDE CV LINE RIGHT       06/10/2016    .   IR GENERIC HISTORICAL       06/07/2016        IR US GUIDE VASC ACCESS RIGHT 06/07/2016 Daniel Hassell, MD [facility]    .   IR THROMBECTOMY AV FISTULA W/THROMBOLYSIS/PTA INC/SHUNT/IMG RIGHT   Right   06/07/2016    .   IR US GUIDE VASC ACCESS RIGHT       06/10/2016    .   LIGATION ARTERIOVENOUS GORTEX GRAFT   Left   08/04/2016        Procedure: LIGATION ARTERIOVENOUS GORTEX GRAFT;  Surgeon: Early, Todd F, MD;  [facility]: [facility];  Service: Vascular;  Laterality: Left;    .   LIGATION ARTERIOVENOUS GORTEX GRAFT   Right   11/26/2016        Procedure: LIGATION OF RIGHT UPPER ARM ARTERIOVENOUS GORTEX GRAFT;  Surgeon: Chen, Brian L, MD;  [facility]: [facility];  Service: Vascular;  Laterality: Right;    .   LIGATION OF ARTERIOVENOUS  FISTULA   Right   08/25/2012        Procedure: LIGATION OF ARTERIOVENOUS  FISTULA;  Surgeon: Brian L Chen, MD;  [facility];  Service: Vascular;  Laterality: Right;  Ligation of right brachial vein transposition    .   PERIPHERAL VASCULAR ATHERECTOMY       06/25/2017        Procedure: PERIPHERAL VASCULAR ATHERECTOMY;  Surgeon: Cain, Brandon Christopher, MD;  [facility];  Service: Cardiovascular;;  Rt. PT    .   REMOVAL OF A DIALYSIS CATHETER   Right   05/05/2012        Procedure: REMOVAL OF A DIALYSIS CATHETER;  Surgeon: Christopher S Dickson, MD;  [facility];  Service: Vascular;  Laterality: Right;    .   REMOVAL OF A DIALYSIS CATHETER   Right   10/01/2016        Procedure: REMOVAL OF A DIALYSIS CATHETER-RIGHT UPPER CHEST;  Surgeon: Chen, Brian L, MD;  [facility];  Service: Vascular;   Laterality: Right;    .   REMOVAL OF A DIALYSIS CATHETER   Right   11/26/2016        Procedure: REMOVAL OF RIGHT FEMORAL TUNNEL DIALYSIS CATHETER;  Surgeon: Chen, Brian L, MD;    [facility];  Service: Vascular;  Laterality: Right;    .   SPHINCTEROTOMY   N/A   05/15/2017        Procedure: POSSIBLE SPHINCTEROTOMY (BOTOX);  Surgeon: Thomas, Alicia, MD;  Location: [facility];  Service: General;  Laterality: N/A;    .   TOE AMPUTATION       02/2007        left foot first 3 toes    .   UPPER EXTREMITY VENOGRAPHY   Bilateral   07/18/2016        Procedure: Bilateral Upper Extremity Venography;  Surgeon: Chen, Brian L, MD;  [facility];  Service: Cardiovascular;  Laterality: Bilateral;             Family History    Problem   Relation   Age of Onset    .   Diabetes   Mother        .   Hypertension   Mother        .   AAA (abdominal aortic aneurysm)   Mother        .   Diabetes   Father        .   Hypertension   Father           Social History:  reports that he has never smoked. He has never used smokeless tobacco. He reports that he does not drink alcohol or use drugs.  Allergies:         Allergies    Allergen   Reactions    .   Lisinopril   Other (See Comments)            HYPERKALEMIA  RENAL DYSFUNCTION PROGRESSION              Medications Prior to Admission    Medication   Sig   Dispense   Refill    .   calcium carbonate (TUMS - DOSED IN MG ELEMENTAL CALCIUM) 500 MG chewable tablet   Chew 1 tablet by mouth daily as needed for indigestion or heartburn.            .   carboxymethylcellulose (REFRESH PLUS) 0.5 % SOLN   Place 1 drop into both eyes 3 (three) times daily as needed (dry eyes). AS DIRECTED             .   dicyclomine (BENTYL) 10 MG capsule   1 PO 30 MINUTES PRIOR TO BREAKFAST AND LUNCH (Patient  taking differently: Take 10 mg by mouth 2 (two) times daily. )   62 capsule   11    .   insulin glargine (LANTUS) 100 UNIT/ML injection   Inject 5 Units into the skin at bedtime.             .   losartan (COZAAR) 50 MG tablet   Take 50 mg by mouth every evening.             .   multivitamin (RENA-VIT) TABS tablet   Take 1 tablet by mouth daily.            .   pantoprazole (PROTONIX) 40 MG tablet   1 PO 30 MINUTES PRIOR TO MEALS BID FOR 3 MOS THEN QD (Patient taking differently: Take 40 mg by mouth 2 (two) times daily. )   60 tablet   11    .   Polysacch Fe Cmp-Fe Heme Poly (BIFERA) 28 MG TABS     1 po bid (Patient taking differently: Take 28 mg by mouth 2 (two) times daily. 1 po bid)   60 tablet   11    .   sevelamer carbonate (RENVELA) 800 MG tablet   Take 1,600 mg by mouth 3 (three) times daily with meals. Supposed to take one 800 mg with snacks and caregiver said he only does that sometimes            .   SSD 1 % cream   Apply 1 application topically daily.            .   traMADol (ULTRAM) 50 MG tablet   Take 1 tablet (50 mg total) by mouth every 6 (six) hours as needed.   30 tablet   0          Home:  Home Living  Family/patient expects to be discharged to:: Private residence  Living Arrangements: Alone   Functional History:    Functional Status:   Mobility:             ADL:       Cognition:  Cognition  Orientation Level: Oriented X4       Blood pressure (!) 132/52, pulse 72, temperature 98.4 F (36.9 C), temperature source Oral, resp. rate 18, height 5' 7" (1.702 m), weight 57.4 kg (126 lb 8.7 oz), SpO2 97 %.  Physical Exam   Vitals reviewed.  Constitutional: He is oriented to person, place, and time.   HENT:   Head: Normocephalic.   Eyes: EOM are normal.   Neck: Normal range of motion. Neck supple. No thyromegaly present.   Cardiovascular: Normal rate and  regular rhythm.   Respiratory: Effort normal and breath sounds normal. No respiratory distress.   GI: Soft. Bowel sounds are normal. He exhibits no distension.  Neurological: He is alert and oriented to person, place, and time.   Skin:  BKA site is dressed and appropriately tender   Upper extremities have evidence of atrophy in the hand intrinsics.  Left foot with intrinsic atrophy.  Sensation is intact in the upper extremities but reduced below the knee in the left lower extremity.  Right lower extremity not tested does have Ace wrap.  Motor strength is 5/5 bilateral deltoid bicep tricep 4/5 bilateral grip  4/5 bilateral hip flexors patient does have active right knee extension and flexion not tested against resistance secondary to recent surgery, left lower extremity 4/5 in the knee extensors and 3- ankle dorsiflexor plantar flexor     Lab Results Last 24 Hours                                                                                                                                                                                                                                                                                                                                                                                                                                                                                                                                                                                                                                                                                                                                                                                                                                                                                                                                                                                                                                       Imaging Results (Last 48 hours)             Assessment/Plan:  Diagnosis: Right BKA nontraumatic patient with peripheral vascular disease  1.Does the need for close, 24 hr/day medical supervision in concert with the patient's rehab needs make it unreasonable for this patient to be served in a less intensive setting? Yes   2.Co-Morbidities requiring supervision/potential complications: Diabetes with peripheral neuropathy, acute blood loss anemia, end-stage renal disease with hemodialysis   3.Due to bladder management, bowel management, safety, skin/wound care, disease management, medication administration, pain management and patient education, does the patient require 24 hr/day rehab nursing? Yes   4.Does the patient require coordinated care of a physician, rehab nurse, PT (1-2 hrs/day, 5 days/week) and OT (1-2 hrs/day, 5 days/week) to address physical and functional deficits in the context of the above medical diagnosis(es)? Yes Addressing deficits in the following areas: balance, endurance, locomotion, strength, transferring, bowel/bladder control, bathing, dressing, grooming, toileting, cognition and psychosocial support   5.Can the patient actively participate in an intensive therapy program of at least 3 hrs of therapy per day at least 5 days per week? Should be  able to in 1 to 2 days   6.The potential for patient to make measurable gains while on inpatient rehab is good   7.Anticipated functional outcomes upon discharge from inpatient rehab are modified independent and supervision  with PT, modified independent and supervision with OT, n/a with SLP.   8.Estimated rehab length of stay to reach the above functional goals is: 7 to 10 days   9.Anticipated D/C setting: Home   10.Anticipated post D/C treatments: HH therapy   11.Overall Rehab/Functional Prognosis: good      RECOMMENDATIONS:  This patient's condition is appropriate for continued rehabilitative care in the following setting: CIR  Patient has agreed to participate in recommended program. Yes  Note that insurance prior authorization may be required for reimbursement for recommended care.     Comment:      Andrew E. Kirsteins M.D.  [facility]  FAAPM&R (Sports Med, Neuromuscular Med)  Diplomate Am Board of Electrodiagnostic Med     Daniel J Angiulli, PA-C  07/16/2017                Revision History                                        Routing History   

## 2017-07-17 NOTE — Evaluation (Signed)
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 PMH - ESRD on HD, DM, CVA, HTN, PVD, CAD  Clinical Impression  Pt presents to PT with decr in mobility s/p rt BKA due to decr balance, decr strength, and pain associated with it. Pt also with LUE weakness (hx of CVA) that impacts mobility. Pt motivated to return to independence and believe he could benefit from CIR. Expect he could reach supervision level but will need assist at home.    Follow Up Recommendations CIR    Equipment Recommendations  Wheelchair (measurements PT);Wheelchair cushion (measurements PT)    Recommendations for Other Services       Precautions / Restrictions Precautions Precautions: Fall      Mobility  Bed Mobility Overal bed mobility: Needs Assistance Bed Mobility: Supine to Sit     Supine to sit: Min assist     General bed mobility comments: Assist to elevate trunk into sitting and bring hips to EOB  Transfers Overall transfer level: Needs assistance Equipment used: Ambulation equipment used Transfers: Sit to/from Stand;Lateral/Scoot Transfers Sit to Stand: Max assist;From elevated surface        Lateral/Scoot Transfers: Mod assist General transfer comment: Assist to bring hips and trunk up when standing with stedy. Pt with posterior and lt lean and unable to get seat flaps in place. Returned to sitting EOB and then performed lateral scoot transfer to chair toward his rt.  Ambulation/Gait             General Gait Details: Unable  Stairs            Wheelchair Mobility    Modified Rankin (Stroke Patients Only)       Balance Overall balance assessment: Needs assistance Sitting-balance support: No upper extremity supported;Feet supported Sitting balance-Leahy Scale: Fair       Standing balance-Leahy Scale:  Zero                               Pertinent Vitals/Pain Pain Assessment: 0-10 Pain Score: 8  Pain Location: rt residual liimb Pain Descriptors / Indicators: Sore;Aching    Home Living Family/patient expects to be discharged to:: Private residence Living Arrangements: Alone Available Help at Discharge: Friend(s);Available PRN/intermittently Type of Home: House Home Access: Level entry     Home Layout: One level Home Equipment: Walker - 2 wheels;Cane - single point      Prior Function Level of Independence: Independent with assistive device(s)         Comments: Uses walker. Girlfriend does grocery shopping.     Hand Dominance   Dominant Hand: Right    Extremity/Trunk Assessment   Upper Extremity Assessment Upper Extremity Assessment: Defer to OT evaluation    Lower Extremity Assessment Lower Extremity Assessment: Generalized weakness;RLE deficits/detail RLE Deficits / Details: BKA, Able to perform straight leg raise, knee AROM 5-75 degrees       Communication   Communication: No difficulties  Cognition Arousal/Alertness: Awake/alert Behavior During Therapy: WFL for tasks assessed/performed Overall Cognitive Status: Within Functional Limits for tasks assessed                                        General Comments  Exercises     Assessment/Plan    PT Assessment Patient needs continued PT services  PT Problem List Decreased strength;Decreased range of motion;Decreased activity tolerance;Decreased balance;Decreased mobility;Decreased knowledge of use of DME;Pain       PT Treatment Interventions DME instruction;Functional mobility training;Gait training;Therapeutic activities;Therapeutic exercise;Balance training;Patient/family education;Wheelchair mobility training    PT Goals (Current goals can be found in the Care Plan section)  Acute Rehab PT Goals Patient Stated Goal: return home PT Goal Formulation: With  patient Time For Goal Achievement: 07/31/17 Potential to Achieve Goals: Good    Frequency Min 3X/week   Barriers to discharge Decreased caregiver support Lives alone and only intermittent support    Co-evaluation               AM-PAC PT "6 Clicks" Daily Activity  Outcome Measure Difficulty turning over in bed (including adjusting bedclothes, sheets and blankets)?: A Little Difficulty moving from lying on back to sitting on the side of the bed? : Unable Difficulty sitting down on and standing up from a chair with arms (e.g., wheelchair, bedside commode, etc,.)?: Unable Help needed moving to and from a bed to chair (including a wheelchair)?: A Lot Help needed walking in hospital room?: Total Help needed climbing 3-5 steps with a railing? : Total 6 Click Score: 9    End of Session Equipment Utilized During Treatment: Gait belt Activity Tolerance: Patient tolerated treatment well Patient left: in chair;with call bell/phone within reach;with chair alarm set Nurse Communication: Mobility status PT Visit Diagnosis: Other abnormalities of gait and mobility (R26.89);Muscle weakness (generalized) (M62.81);Pain Pain - Right/Left: Right Pain - part of body: Leg    Time: 3291-9166 PT Time Calculation (min) (ACUTE ONLY): 29 min   Charges:   PT Evaluation $PT Eval Moderate Complexity: 1 Mod PT Treatments $Therapeutic Activity: 8-22 mins   PT G Codes:        [facility] PT [phone]   Angelina Ok [facility] 07/17/2017, 9:55 AM

## 2017-07-17 NOTE — Care Management Important Message (Signed)
Important Message  Patient Details  Name: Charles Vasquez MRN: [id number] Date of Birth: June 16, 1935   Medicare Important Message Given:  Yes    Maisa Bedingfield P Kemberly Taves 07/17/2017, 1:35 PM

## 2017-07-17 NOTE — H&P (Signed)
Physical Medicine and Rehabilitation Admission H&P         Chief Complaint  Patient presents with  . foot infection      right  : HPI: Charles Vasquez is a 82 year old right-handed male with history of hypertension, CAD/MI end-stage renal disease with hemodialysis Monday Wednesdays and Fridays at [facility] dialysis center in [place], diabetes mellitus.  Patient lives alone.  He has a girlfriend that checks on him daily and a sister in the area.  One level home.  Resented 07/12/2017 with chronic right wound infection progressive ischemic changes.  Patient with recent right lower extremity angiogram with attempted revascularization of right posterior vertebral artery was unsuccessful 5 weeks ago.  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 Bridgett Larsson.  Hospital course pain management.  Hemodialysis ongoing as per renal services.  Subcutaneous heparin for DVT prophylaxis.  Acute on chronic anemia 8.9 and received 2 units packed red blood cells and monitored.  Blood cultures with Proteus/strep currently maintained on Rocephin.  Follow-up of left lower extremity arterial duplex completed showing mild plaque noted throughout no evidence of significant stenosis.  Physical and occupational therapy evaluations completed with recommendations of physical medicine rehab consult.  Patient was admitted for a comprehensive rehab program   Review of Systems  Constitutional: Negative for chills and fever.  HENT: Negative for hearing loss.   Eyes: Negative for double vision.  Respiratory:       Occasional shortness of breath with exertion  Cardiovascular: Positive for palpitations and leg swelling. Negative for chest pain.  Gastrointestinal: Positive for constipation. Negative for nausea and vomiting.  Genitourinary: Negative for dysuria and hematuria.  Musculoskeletal: Positive for myalgias.  Skin: Negative for rash.  Neurological: Negative for seizures.  All other systems  reviewed and are negative.       Past Medical History:  Diagnosis Date  . Anal pain    . AVF (arteriovenous fistula) ([place]) 05-13-2017 per pt currently AVF access used is left thigh    hx multiple AVF surgery's (previously left upper arm, bilateral thigh) last surgery -- right upper arm creation 11-26-2016  . ESRD (end stage renal disease) on dialysis [facility])      [place] in [place], [place] on MWF   . History of CVA (cerebrovascular accident)      05-13-2017  per pt stated "thats what they told yrs ago"  per pt no residual  . Hyperlipidemia    . Hypertension    . Myocardial infarction ([place])    . Stroke ([place])    . Type 2 diabetes mellitus treated with insulin [facility])      Past Surgical History:  Procedure Laterality Date  . ABDOMINAL AORTOGRAM W/LOWER EXTREMITY N/A 06/25/2017    Procedure: ABDOMINAL AORTOGRAM W/LOWER EXTREMITY;  Surgeon: Waynetta Sandy, MD;  Location: [place] CV LAB;  Service: Cardiovascular;  Laterality: N/A;  . AMPUTATION Right 01/01/2017    Procedure: REVISION AMPUTATION RIGHT INDEX FINGER;  Surgeon: Charlotte Crumb, MD;  Location: [place];  Service: Orthopedics;  Laterality: Right;  . AMPUTATION Right 07/15/2017    Procedure: AMPUTATION BELOW KNEE RIGHT;  Surgeon: Conrad Glidden, MD;  Location: [place];  Service: Vascular;  Laterality: Right;  . ARTERIOVENOUS GRAFT PLACEMENT      . ARTERIOVENOUS GRAFT PLACEMENT Left 10/22/2016    thigh  . AV FISTULA PLACEMENT   03/31/2012    Procedure: ARTERIOVENOUS (AV) FISTULA CREATION;  Surgeon: Conrad Coldstream, MD;  Location: [facility];  Service: Vascular;  Laterality: Right;  First stage Brachial vein transposition  . AV FISTULA PLACEMENT Right 08/25/2012    Procedure: INSERTION OF ARTERIOVENOUS (AV) GORE-TEX GRAFT ARM;  Surgeon: Conrad Avon, MD;  Location: [place];  Service: Vascular;  Laterality: Right;  . AV FISTULA PLACEMENT Left 07/23/2016    Procedure: INSERTION OF ARTERIOVENOUS (AV) GORE-TEX GRAFT LEFT UPPER ARM;   Surgeon: Conrad Marcus, MD;  Location: [place];  Service: Vascular;  Laterality: Left;  . AV FISTULA PLACEMENT Left 10/22/2016    Procedure: INSERTION OF 4-73m x 45cm  ARTERIOVENOUS (AV) GORE-TEX GRAFT THIGH-LEFT;  Surgeon: CConrad Sidney MD;  Location: [place]  Service: Vascular;  Laterality: Left;  . CATARACT EXTRACTION W/ INTRAOCULAR LENS  IMPLANT, BILATERAL      . COLONOSCOPY N/A 03/25/2017    Dr. FOneida Alar examined portion of ileum normal. Redundant left colon, stricture at anus   . ESOPHAGOGASTRODUODENOSCOPY N/A 03/25/2017    Dr. FOneida Alar widely patent Schatzki ring at GE junction s/p dilation, atrophic gastritis, single duodenal AVM, non-bleeding  . INSERTION OF DIALYSIS CATHETER Left 05/05/2012    Procedure: INSERTION OF DIALYSIS CATHETER;  Surgeon: CAngelia Mould MD;  Location: [facility]  Service: Vascular;  Laterality: Left;  . INSERTION OF DIALYSIS CATHETER Right 10/01/2016    Procedure: INSERTION OF DIALYSIS CATHETER- RIGHT FEMORAL;  Surgeon: CConrad [place] MD;  [place]  Service: Vascular;  Laterality: Right;  . IR FLUORO GUIDE CV LINE RIGHT   06/10/2016  . IR GENERIC HISTORICAL   06/07/2016    IR [place] ACCESS RIGHT 06/07/2016 DArne Cleveland MD [facility]-INTERV RAD  . IR THROMBECTOMY AV FISTULA W/THROMBOLYSIS/PTA INC/SHUNT/IMG RIGHT Right 06/07/2016  . IR [place] VASC ACCESS RIGHT   06/10/2016  . LIGATION ARTERIOVENOUS GORTEX GRAFT Left 08/04/2016    Procedure: LIGATION ARTERIOVENOUS GORTEX GRAFT;  Surgeon: ERosetta Posner MD;  [place] [facility]  Service: Vascular;  Laterality: Left;  . LIGATION ARTERIOVENOUS GORTEX GRAFT Right 11/26/2016    Procedure: LIGATION OF RIGHT UPPER ARM ARTERIOVENOUS GORTEX GRAFT;  Surgeon: CConrad Elverson MD;  [place]  Service: Vascular;  Laterality: Right;  . LIGATION OF ARTERIOVENOUS  FISTULA Right 08/25/2012    Procedure: LIGATION OF ARTERIOVENOUS  FISTULA;  Surgeon: BConrad Hunt MD;  [place] [facility]  Service: Vascular;  Laterality: Right;   Ligation of right brachial vein transposition  . PERIPHERAL VASCULAR ATHERECTOMY   06/25/2017    Procedure: PERIPHERAL VASCULAR ATHERECTOMY;  Surgeon: CWaynetta Sandy MD;  [place];  Service: Cardiovascular;;  Rt. PT  . REMOVAL OF A DIALYSIS CATHETER Right 05/05/2012    Procedure: REMOVAL OF A DIALYSIS CATHETER;  Surgeon: CAngelia Mould MD;  [place]  Service: Vascular;  Laterality: Right;  . REMOVAL OF A DIALYSIS CATHETER Right 10/01/2016    Procedure: REMOVAL OF A DIALYSIS CATHETER-RIGHT UPPER CHEST;  Surgeon: CConrad Munford MD;  Location: [facility]  Service: Vascular;  Laterality: Right;  . REMOVAL OF A DIALYSIS CATHETER Right 11/26/2016    Procedure: REMOVAL OF RIGHT FEMORAL TUNNEL DIALYSIS CATHETER;  Surgeon: CConrad Broaddus MD;  [place]  Service: Vascular;  Laterality: Right;  . SPHINCTEROTOMY N/A 05/15/2017    Procedure: POSSIBLE SPHINCTEROTOMY (BOTOX);  Surgeon: TLeighton Ruff MD;  [facility]  Service: General;  Laterality: N/A;  . TOE AMPUTATION   02/2007    left foot first 3 toes  .  UPPER EXTREMITY VENOGRAPHY Bilateral 07/18/2016    Procedure: Bilateral Upper Extremity Venography;  Surgeon: Conrad Hardin, MD;  Location: [place] [facility];  Service: Cardiovascular;  Laterality: Bilateral;         Family History  Problem Relation Age of Onset  . Diabetes Mother    . Hypertension Mother    . AAA (abdominal aortic aneurysm) Mother    . Diabetes Father    . Hypertension Father      Social History:  reports that he has never smoked. He has never used smokeless tobacco. He reports that he does not drink alcohol or use drugs. Allergies:  Allergies  Allergen Reactions  . Lisinopril Other (See Comments)      HYPERKALEMIA RENAL DYSFUNCTION PROGRESSION          Medications Prior to Admission  Medication Sig Dispense Refill  . calcium carbonate (TUMS - DOSED IN MG ELEMENTAL CALCIUM) 500 MG chewable tablet Chew  1 tablet by mouth daily as needed for indigestion or heartburn.      . carboxymethylcellulose (REFRESH PLUS) 0.5 % SOLN Place 1 drop into both eyes 3 (three) times daily as needed (dry eyes). AS DIRECTED       . dicyclomine (BENTYL) 10 MG capsule 1 PO 30 MINUTES PRIOR TO BREAKFAST AND LUNCH (Patient taking differently: Take 10 mg by mouth 2 (two) times daily. ) 62 capsule 11  . insulin glargine (LANTUS) 100 UNIT/ML injection Inject 5 Units into the skin at bedtime.       Marland Kitchen losartan (COZAAR) 50 MG tablet Take 50 mg by mouth every evening.       . multivitamin (RENA-VIT) TABS tablet Take 1 tablet by mouth daily.      . pantoprazole (PROTONIX) 40 MG tablet 1 PO 30 MINUTES PRIOR TO MEALS BID FOR 3 MOS THEN QD (Patient taking differently: Take 40 mg by mouth 2 (two) times daily. ) 60 tablet 11  . Polysacch Fe Cmp-Fe Heme Poly (BIFERA) 28 MG TABS 1 po bid (Patient taking differently: Take 28 mg by mouth 2 (two) times daily. 1 po bid) 60 tablet 11  . sevelamer carbonate (RENVELA) 800 MG tablet Take 1,600 mg by mouth 3 (three) times daily with meals. Supposed to take one 800 mg with snacks and caregiver said he only does that sometimes      . SSD 1 % cream Apply 1 application topically daily.      . traMADol (ULTRAM) 50 MG tablet Take 1 tablet (50 mg total) by mouth every 6 (six) hours as needed. 30 tablet 0      Drug Regimen Review Drug regimen was reviewed and remains appropriate with no significant issues identified   Home: Home Living Family/patient expects to be discharged to:: Private residence Living Arrangements: Alone   Functional History:   Functional Status:  Mobility:   ADL:   Cognition: Cognition Orientation Level: Oriented X4   Physical Exam: Blood pressure (!) 168/72, pulse 60, temperature 98.8 F (37.1 C), temperature source Oral, resp. rate (!) 22, height _0  (1.702 m), weight 55.4 kg (122 lb 2.2 oz), SpO2 96 %. Physical Exam  Vitals reviewed. Constitutional: He is  oriented to person, place, and time. He appears well-developed.  HENT:  Head: Normocephalic.  Eyes: EOM are normal.  Neck: Normal range of motion. Neck supple. No thyromegaly present.  Cardiovascular: Normal rate and regular rhythm.  Respiratory: Effort normal and breath sounds normal. No respiratory distress.  GI: Soft. Bowel sounds are  normal. He exhibits no distension.  Neurological: He is alert and oriented to person, place, and time.  Skin warm and dry with dressing in place to BKA site.  Appropriately tender  Motor 5/5 R bi delt , tridcept 4/5 R grip 5/5 Left Bi Tri 4/5 Grip 3- L  Deltoid (pain) Sensation absent Left LE below knee Right BKA CDI, mild edema Left foot has digits 1 4, 5 amputated MSK reduced ROM RLeft shoulder Ext rotation and abd Lab Results Last 48 Hours        Results for orders placed or performed during the hospital encounter of 07/11/17 (from the past 48 hour(s))  Glucose, capillary     Status: Abnormal    Collection Time: 07/15/17  7:50 AM  Result Value Ref Range    Glucose-Capillary 267 (H) 65 - 99 mg/dL    Comment 1 Notify RN      Comment 2 Document in Chart    Glucose, capillary     Status: Abnormal    Collection Time: 07/15/17 10:09 AM  Result Value Ref Range    Glucose-Capillary 255 (H) 65 - 99 mg/dL    Comment 1 Notify RN      Comment 2 Document in Chart    I-STAT 4, (NA,K, GLUC, HGB,HCT)     Status: Abnormal    Collection Time: 07/15/17 10:36 AM  Result Value Ref Range    Sodium 135 135 - 145 mmol/L    Potassium 4.3 3.5 - 5.1 mmol/L    Glucose, Bld 267 (H) 65 - 99 mg/dL    HCT 23.0 (L) 39.0 - 52.0 %    Hemoglobin 7.8 (L) 13.0 - 17.0 g/dL  Type and screen [place]     Status: None    Collection Time: 07/15/17 11:34 AM  Result Value Ref Range    ABO/RH(D) O POS      Antibody Screen NEG      Sample Expiration 07/18/2017      Unit Number [id number]      Blood Component Type RED CELLS,LR      Unit division 00       Status of Unit ISSUED,FINAL      Transfusion Status OK TO TRANSFUSE      Crossmatch Result          Compatible Performed at [facility], [address] [place], [place] [address]      Unit Number [id number]      Blood Component Type RED CELLS,LR      Unit division 00      Status of Unit ISSUED,FINAL      Transfusion Status OK TO TRANSFUSE      Crossmatch Result Compatible    Prepare RBC     Status: None    Collection Time: 07/15/17 11:34 AM  Result Value Ref Range    Order Confirmation          ORDER PROCESSED BY BLOOD BANK Performed at [facility], [place] [address], [place], [place] [address]    Prepare RBC     Status: None    Collection Time: 07/15/17 11:38 AM  Result Value Ref Range    Order Confirmation          ORDER PROCESSED BY BLOOD BANK Performed at [facility], [place] [address], [place], [place] [address]    Hemoglobin and hematocrit, blood     Status: Abnormal    Collection Time: 07/15/17  4:20 PM  Result Value Ref Range    Hemoglobin 8.9 (L) 13.0 - 17.0 g/dL    HCT 28.9 (L) 39.0 - 52.0 %      Comment: Performed at [facility] [address] [place], [place] [address]  Glucose, capillary     Status: Abnormal    Collection Time: 07/15/17  4:40 PM  Result Value Ref Range    Glucose-Capillary 329 (H) 65 - 99 mg/dL    Comment 1 Notify RN      Comment 2 Document in Chart    Glucose, capillary     Status: Abnormal    Collection Time: 07/15/17  9:17 PM  Result Value Ref Range    Glucose-Capillary 222 (H) 65 - 99 mg/dL  Basic metabolic panel     Status: Abnormal    Collection Time: 07/16/17  3:51 AM  Result Value Ref Range    Sodium 133 (L) 135 - 145 mmol/L    Potassium 5.1 3.5 - 5.1 mmol/L    Chloride 98 (L) 101 - 111 mmol/L    CO2 22 22 - 32 mmol/L    Glucose, Bld 226 (H) 65 - 99 mg/dL    BUN 47 (H) 6 - 20 mg/dL    Creatinine, Ser 8.31 (H) 0.61 - 1.24 mg/dL    Calcium 6.9 (L) 8.9 - 10.3 mg/dL    GFR calc non Af Amer 5 (L) >60  mL/min    GFR calc Af Amer 6 (L) >60 mL/min      Comment: (NOTE) The eGFR has been calculated using the CKD EPI equation. This calculation has not been validated in all clinical situations. eGFR's persistently <60 mL/min signify possible Chronic Kidney Disease.      Anion gap 13 5 - 15      Comment: Performed at [facility] [address], [place], [place] [address]  CBC     Status: Abnormal    Collection Time: 07/16/17  3:51 AM  Result Value Ref Range    WBC 12.6 (H) 4.0 - 10.5 K/uL    RBC 3.47 (L) 4.22 - 5.81 MIL/uL    Hemoglobin 9.4 (L) 13.0 - 17.0 g/dL    HCT 29.8 (L) 39.0 - 52.0 %    MCV 85.9 78.0 - 100.0 fL    MCH 27.1 26.0 - 34.0 pg    MCHC 31.5 30.0 - 36.0 g/dL    RDW 15.6 (H) 11.5 - 15.5 %    Platelets 184 150 - 400 K/uL      Comment: Performed at [facility], [place] [address] [place], [place] [address]  Glucose, capillary     Status: Abnormal    Collection Time: 07/16/17  6:27 AM  Result Value Ref Range    Glucose-Capillary 229 (H) 65 - 99 mg/dL  Glucose, capillary     Status: None    Collection Time: 07/16/17  4:48 PM  Result Value Ref Range    Glucose-Capillary 94 65 - 99 mg/dL    Comment 1 Notify RN      Comment 2 Document in Chart    Glucose, capillary     Status: Abnormal    Collection Time: 07/16/17  9:33 PM  Result Value Ref Range    Glucose-Capillary 177 (H) 65 - 99 mg/dL  Basic metabolic panel     Status: Abnormal    Collection Time: 07/17/17  4:09 AM  Result Value Ref Range    Sodium 134 (L) 135 - 145 mmol/L  Potassium 4.1 3.5 - 5.1 mmol/L    Chloride 97 (L) 101 - 111 mmol/L    CO2 28 22 - 32 mmol/L    Glucose, Bld 120 (H) 65 - 99 mg/dL    BUN 24 (H) 6 - 20 mg/dL    Creatinine, Ser 5.35 (H) 0.61 - 1.24 mg/dL      Comment: DELTA CHECK NOTED    Calcium 7.0 (L) 8.9 - 10.3 mg/dL    GFR calc non Af Amer 9 (L) >60 mL/min    GFR calc Af Amer 11 (L) >60 mL/min      Comment: (NOTE) The eGFR has been calculated using the CKD EPI  equation. This calculation has not been validated in all clinical situations. eGFR's persistently <60 mL/min signify possible Chronic Kidney Disease.      Anion gap 9 5 - 15      Comment: Performed at [facility] [address], [place], [place] [address]  CBC with Differential/Platelet     Status: Abnormal    Collection Time: 07/17/17  4:09 AM  Result Value Ref Range    WBC 11.2 (H) 4.0 - 10.5 K/uL    RBC 3.25 (L) 4.22 - 5.81 MIL/uL    Hemoglobin 8.9 (L) 13.0 - 17.0 g/dL    HCT 28.4 (L) 39.0 - 52.0 %    MCV 87.4 78.0 - 100.0 fL    MCH 27.4 26.0 - 34.0 pg    MCHC 31.3 30.0 - 36.0 g/dL    RDW 15.5 11.5 - 15.5 %    Platelets 202 150 - 400 K/uL    Neutrophils Relative % 80 %    Neutro Abs 9.0 (H) 1.7 - 7.7 K/uL    Lymphocytes Relative 8 %    Lymphs Abs 0.9 0.7 - 4.0 K/uL    Monocytes Relative 10 %    Monocytes Absolute 1.2 (H) 0.1 - 1.0 K/uL    Eosinophils Relative 2 %    Eosinophils Absolute 0.2 0.0 - 0.7 K/uL    Basophils Relative 0 %    Basophils Absolute 0.0 0.0 - 0.1 K/uL      Comment: Performed at [facility] [address], [place], [place] [address]  Glucose, capillary     Status: None    Collection Time: 07/17/17  6:00 AM  Result Value Ref Range    Glucose-Capillary 68 65 - 99 mg/dL      Imaging Results (Last 48 hours)  No results found.           Medical Problem List and Plan: 1.  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  DVT Prophylaxis/Anticoagulation: Subcutaneous heparin.  Monitor for any bleeding episodes 3. Pain Management: Ultram as needed 4. Mood: Provide emotional support 5. Neuropsych: This patient is capable of making decisions on his own behalf. 6. Skin/Wound Care: Routine skin checks 7. Fluids/Electrolytes/Nutrition: Routine in and outs with follow-up chemistries 8.  Acute on chronic anemia.  Follow-up CBC.  Continue Niferex 9.  End-stage renal disease with hemodialysis.  Follow-up renal services 10.   Diabetes mellitus with peripheral neuropathy.  Hemoglobin A1c 7.1.  Lantus insulin 8 units daily.  Check blood sugars before meals and at bedtime.  Diabetic teaching 11.  ID.  Blood cultures Proteus/strep.  Continue course of Rocephin 12.  Hypertension.  Currently holding losartan due to hypotension.  Monitor with increased mobility 13.  Constipation.  Laxative assistance   Post Admission Physician Evaluation: 1. Functional deficits secondary  to Right BKA. 2. Patient admitted to receive collaborative, interdisciplinary care between the physiatrist, rehab nursing staff, and therapy team. 3. Patient's level of medical complexity and substantial therapy needs in context of that medical necessity cannot be provided at a lesser intensity of care. 4. Patient has experienced substantial functional loss from his/her baseline. Upon functional assessment at the time of the preadmission screening, patient was post op day one.  Upon most recent functional evaluation, patient was Max A transfers, Mod/Max ADLs.  Judging by the patient's diagnosis, physical exam, and functional history, the patient has potential for functional progress which will result in measurable gains while on inpatient rehab.  These gains will be of substantial and practical use upon discharge in facilitating mobility and self-care at the household level. 5. Physiatrist will provide 24 hour management of medical needs as well as oversight of the therapy plan/treatment and provide guidance as appropriate regarding the interaction of the two. 6. 24 hour rehab nursing will assist in the management of  bowel management, safety, skin/wound care, disease management, medication administration, pain management and patient education  and help integrate therapy concepts, techniques,education, etc. 7. PT will assess and treat for:   pre gait, gait training, endurance , safety, equipment, neuromuscular re education.  Goals are: supervision. 8. OT will  assess and treat for  . ADLs, Cognitive perceptual skills, Neuromuscular re education, safety, endurance, equipment Goals are: supervision.  9. SLP will assess and treat for  .  Goals are: N/A. 10. Case Management and Social Worker will assess and treat for psychological issues and discharge planning. 11. Team conference will be held weekly to assess progress toward goals and to determine barriers to discharge. 12.  Patient will receive at least 3 hours of therapy per day at least 5 days per week. 13. ELOS and Prognosis: 7-10d good      "I have personally performed a face to face diagnostic evaluation of this patient.  Additionally, I have reviewed and concur with the physician assistant's documentation above."  Charlett Blake M.D. [facility] FAAPM&R (Sports Med, Neuromuscular Med) Diplomate Am Board of Electrodiagnostic Med  Cathlyn Parsons, PA-C 07/17/2017

## 2017-07-17 NOTE — Progress Notes (Signed)
Orthopedic Tech Progress Note Patient Details:  Charles Vasquez 17-Aug-1935 [id number]  Patient ID: Charles Vasquez, male   DOB: [birth year], 82 y.o.   MRN: [id number]   Charles Vasquez 07/17/2017, 11:05 AMCalled [employer] for right BKA stump sock.

## 2017-07-17 NOTE — Progress Notes (Signed)
Charles Vasquez, OT  Rehab Admission Coordinator  Physical Medicine and Rehabilitation  PMR Pre-admission  Addendum  Date of Service:  07/17/2017 3:04 PM       Related encounter: ED to Hosp-Admission (Current) from 07/11/2017 in [facility] 4E CV SURGICAL PROGRESSIVE CARE           Show:Clear all [x] Manual[x] Template[x] Copied  Added by: [x] Charles Vasquez, OT   [] Hover for details   PMR Admission Coordinator Pre-Admission Assessment  Patient: Charles Vasquez is an 82 y.o., male MRN: [id number] DOB: [birth year] Height: 5\' 7"  (170.2 cm) Weight: 55.4 kg (122 lb 2.2 oz)                                                                                                                                                  Insurance Information HMO: YES  PPO:      PCP:      IPA:      80/20:      OTHER: AARP Medicare Complete  PRIMARY: UHC Medicare      Policy#: [id number]      Subscriber: Patient CM Name: Charles Vasquez      Phone#: [phone]     Fax#: [phone] Pre-Cert#: [id number]    Cert for 7 days f/u with Charles Vasquez  Employer:  Benefits:  Phone #: [phone]     Name: Online  Eff. Date: 06/09/17     Deduct: $0      Out of Pocket Max: $4,400      Life Max: NA CIR: $345/day for days 1-5; $0/day for 6+      SNF: $0/day for days 1-20, $160/day for days 21-48, $0/day for 49-100 (SNF day limit: 100 days).  Outpatient: with medical necessity     Co-Pay: $40/visit Home Health: 100%     Co-Pay: none DME: 80%     Co-Pay: 20% Providers: In network  SECONDARY: None        Medicaid Application Date:       Case Manager:  Disability Application Date:       Case Worker:   Emergency Contact Information         Contact Information    Name Relation Home Work Mobile   Charles Vasquez Friend [phone]  [phone]   Charles Vasquez, (sister) Sister   [phone]     Current Medical History  Patient Admitting Diagnosis: Right BKA nontraumatic patient with peripheral vascular disease History  of Present Illness: Charles Vasquez is a 82 year old right-handed male with history of hypertension, CAD/MI end-stage renal disease with hemodialysis Monday Wednesdays and Fridays at [facility] dialysis center in [facility] [place], diabetes mellitus. Patient lives alone. He has a girlfriend that checks on him daily and a sister in the area. One level home. Presented 07/12/2017 with chronic right wound infection progressive ischemic changes. Patient with recent right lower extremity angiogram with attempted revascularization  of right posterior vertebral artery was unsuccessful 5 weeks ago. 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 Imogene Vasquez.Hospital course pain management. Hemodialysis ongoing as per renal services. Subcutaneous heparin for DVT prophylaxis. Acute on chronic anemia 8.9 and received 2 units packed red blood cells and monitored. Blood cultures with Proteus/strep currently maintained on Rocephin. Follow-up of left lower extremity arterial duplex completed showing mild plaque noted throughout no evidence of significant stenosis. Physical and occupational therapy evaluations completed with recommendations of physical medicine rehab consult. Patient is to be admitted for a comprehensive rehab program   Past Medical History      Past Medical History:  Diagnosis Date  . Anal pain   . AVF (arteriovenous fistula) (HCC) 05-13-2017 per pt currently AVF access used is left thigh   hx multiple AVF surgery's (previously left upper arm, bilateral thigh) last surgery -- right upper arm creation 11-26-2016  . ESRD (end stage renal disease) on dialysis [facility])    [facility] in [place], [place] on MWF   . History of CVA (cerebrovascular accident)    05-13-2017  per pt stated "thats what they told yrs ago"  per pt no residual  . Hyperlipidemia   . Hypertension   . Myocardial infarction (HCC)   . Stroke (HCC)   . Type 2 diabetes mellitus treated with  insulin (HCC)     Family History  family history includes AAA (abdominal aortic aneurysm) in his mother; Diabetes in his father and mother; Hypertension in his father and mother.  Prior Rehab/Hospitalizations:  Has the patient had major surgery during 100 days prior to admission? Yes  Current Medications   Current Facility-Administered Medications:  .  0.9 %  sodium chloride infusion, , Intravenous, Continuous, Charles Rutter, PA-C, Last Rate: 10 mL/hr at 07/15/17 0755 .  0.9 %  sodium chloride infusion, , Intravenous, Once, Charles Hertz, MD .  acetaminophen (TYLENOL) tablet 650 mg, 650 mg, Oral, Q6H PRN, 650 mg at 07/12/17 0836 **OR** acetaminophen (TYLENOL) suppository 650 mg, 650 mg, Rectal, Q6H PRN, Vasquez, [name], PA-C .  alum & mag hydroxide-simeth (MAALOX/MYLANTA) 200-200-20 MG/5ML suspension 15-30 mL, 15-30 mL, Oral, Q2H PRN, Charles Rutter, PA-C .  calcium carbonate (TUMS - dosed in mg elemental calcium) chewable tablet 200 mg of elemental calcium, 1 tablet, Oral, Daily PRN, Charles Rutter, PA-C .  cefTRIAXone (ROCEPHIN) 2 g in sodium chloride 0.9 % 100 mL IVPB, 2 g, Intravenous, Q24H, Charles Ren, MD, Last Rate: 200 mL/hr at 07/17/17 1627, 2 g at 07/17/17 1627 .  dicyclomine (BENTYL) capsule 10 mg, 10 mg, Oral, BID WC, Vasquez, [name], PA-C, 10 mg at 07/17/17 1243 .  guaiFENesin-dextromethorphan (ROBITUSSIN DM) 100-10 MG/5ML syrup 15 mL, 15 mL, Oral, Q4H PRN, Charles Rutter, PA-C .  heparin injection 5,000 Units, 5,000 Units, Subcutaneous, Q8H, Charles Rutter, PA-C, 5,000 Units at 07/17/17 1630 .  hydrALAZINE (APRESOLINE) injection 5 mg, 5 mg, Intravenous, Q2H PRN, Charles Rutter, PA-C, 5 mg at 07/13/17 1906 .  insulin aspart (novoLOG) injection 0-9 Units, 0-9 Units, Subcutaneous, TID WC, Charles Rutter, PA-C, 3 Units at 07/16/17 [phone] .  insulin glargine (LANTUS) injection 8 Units, 8 Units, Subcutaneous, QHS, Charles Ren, MD, 8 Units at  07/16/17 2139 .  iron polysaccharides (NIFEREX) capsule 150 mg, 150 mg, Oral, BID, Charles Rutter, PA-C, 150 mg at 07/17/17 1031 .  labetalol (NORMODYNE,TRANDATE) injection 10 mg, 10 mg, Intravenous, Q10 min PRN, Vasquez, [name], PA-C .  loperamide (IMODIUM) capsule  2 mg, 2 mg, Oral, Q8H PRN, Charles Rutter, PA-C, 2 mg at 07/12/17 1328 .  metoprolol tartrate (LOPRESSOR) injection 2-5 mg, 2-5 mg, Intravenous, Q2H PRN, Charles Rutter, PA-C, 5 mg at 07/17/17 0432 .  morphine 4 MG/ML injection 4 mg, 4 mg, Intravenous, Q2H PRN, Charles Rutter, PA-C, 4 mg at 07/15/17 1952 .  multivitamin (RENA-VIT) tablet 1 tablet, 1 tablet, Oral, Daily, Charles Rutter, PA-C, 1 tablet at 07/17/17 1031 .  ondansetron (ZOFRAN) tablet 4 mg, 4 mg, Oral, Q6H PRN **OR** ondansetron (ZOFRAN) injection 4 mg, 4 mg, Intravenous, Q6H PRN, Vasquez, [name], PA-C .  pantoprazole (PROTONIX) EC tablet 40 mg, 40 mg, Oral, BID WC, Vasquez, [name], PA-C, 40 mg at 07/17/17 1630 .  phenol (CHLORASEPTIC) mouth spray 1 spray, 1 spray, Mouth/Throat, PRN, Vasquez, [name], PA-C .  polyvinyl alcohol (LIQUIFILM TEARS) 1.4 % ophthalmic solution 1 drop, 1 drop, Both Eyes, TID PRN, Vasquez, [name], PA-C .  senna-docusate (Senokot-S) tablet 1 tablet, 1 tablet, Oral, QHS PRN, Charles Rutter, PA-C .  sevelamer carbonate (RENVELA) tablet 1,600 mg, 1,600 mg, Oral, TID WC, Vasquez, [name], PA-C, 1,600 mg at 07/17/17 1630 .  traMADol (ULTRAM) tablet 50 mg, 50 mg, Oral, Q6H PRN, Charles Rutter, PA-C, 50 mg at 07/17/17 1031  Patients Current Diet:       Diet Order           Diet renal/carb modified with fluid restriction Diet-HS Snack? Nothing; Fluid restriction: 1200 mL Fluid; Room service appropriate? Yes; Fluid consistency: Thin  Diet effective now          Precautions / Restrictions Precautions Precautions: Fall Restrictions Weight Bearing Restrictions: Yes RLE Weight Bearing: Non weight bearing   Has the patient  had 2 or more falls or a fall with injury in the past year?Yes, pt has had a fall 2 weeks ago per his report and sustained bruises to his back. Pt has reportedly fell 2 times this year.   Prior Activity Level Limited Community (1-2x/wk): (gets out for HD only, using bus for transportation )  Journalist, newspaper / Equipment Home Assistive Devices/Equipment: Environmental consultant (specify type)(front wheel) Home Equipment: Dan Humphreys - 2 wheels, [place] - single point. Tub bench  Prior Device Use: Indicate devices/aids used by the patient prior to current illness, exacerbation or injury? Walker and has a single point cane  Prior Functional Level Prior Function Level of Independence: Independent with assistive device(s) Comments: Uses walker/tub bench. Girlfriend does grocery shopping.  Self Care: Did the patient need help bathing, dressing, using the toilet or eating?  Needed some help; per pt he was Mod I for dressing, toileting and feeding but had assistance from his friend for bathing 3x/week (pt unable to accurately clarify level of assistance during bathing process)  Indoor Mobility: Did the patient need assistance with walking from room to room (with or without device)? Independent  Stairs: Did the patient need assistance with internal or external stairs (with or without device)? Independent  Functional Cognition: Did the patient need help planning regular tasks such as shopping or remembering to take medications? Needed some help  Current Functional Level Cognition  Overall Cognitive Status: No family/caregiver present to determine baseline cognitive functioning Orientation Level: Oriented X4 General Comments: Poor carry over from session this AM    Extremity Assessment (includes Sensation/Coordination)  Upper Extremity Assessment: Generalized weakness, LUE deficits/detail, RUE deficits/detail RUE Deficits / Details: grasp 4/5, prior amputation of index finger and necrotic RUE  Sensation: history of peripheral neuropathy LUE Deficits / Details:  Pt limited to 30 degreed FF at shoulder, and 45 degrees shoulder abduction (limited by pain) elbow, wrist, digits WFL LUE: Unable to fully assess due to pain LUE Sensation: history of peripheral neuropathy LUE Coordination: decreased gross motor  Lower Extremity Assessment: Defer to PT evaluation RLE Deficits / Details: BKA, Able to perform straight leg raise, knee AROM 5-75 degrees    ADLs  Overall ADL's : Needs assistance/impaired Eating/Feeding: Modified independent, Sitting Eating/Feeding Details (indicate cue type and reason): able to use RUE for self-feeding Grooming: Wash/dry face, Set up, Sitting, Oral care Grooming Details (indicate cue type and reason): in recliner, provided with items, and Pt able to complete Upper Body Bathing: Moderate assistance, Sitting Lower Body Bathing: Moderate assistance, Sitting/lateral leans Upper Body Dressing : Moderate assistance, Sitting Lower Body Dressing: Maximal assistance, Sitting/lateral leans Lower Body Dressing Details (indicate cue type and reason): Pt unable to reach LLE to don sock Toilet Transfer: Moderate assistance, Requires drop arm Toilet Transfer Details (indicate cue type and reason): wil benefit from drop arm BSC Toileting- Clothing Manipulation and Hygiene: Maximal assistance, Bed level Toileting - Clothing Manipulation Details (indicate cue type and reason): for rear peri care Functional mobility during ADLs: Moderate assistance(lateral scoot transfer) General ADL Comments: Pt with very limited use of LUE during functional ADL tasks    Mobility  Overal bed mobility: Needs Assistance Bed Mobility: Sit to Supine Supine to sit: Min assist Sit to supine: Min guard General bed mobility comments: Assist for safety and lines    Transfers  Overall transfer level: Needs assistance Equipment used: None Transfer via Lift Equipment: Stedy Transfers:  Lateral/Scoot Transfers Sit to Stand: Max assist, From elevated surface  Lateral/Scoot Transfers: Max Engineer, petroleum transfer comment: Assist to scoot laterally from chair to bed. Pt needed verbal/tactile cues for technique. Used bed pad to facilitate scooting hips    Ambulation / Gait / Stairs / Wheelchair Mobility  Ambulation/Gait General Gait Details: Unable    Posture / Balance Dynamic Sitting Balance Sitting balance - Comments: able to sit forward and back in recliner with use of chair arms Balance Overall balance assessment: Needs assistance Sitting-balance support: No upper extremity supported, Feet supported Sitting balance-Leahy Scale: Fair Sitting balance - Comments: able to sit forward and back in recliner with use of chair arms Standing balance-Leahy Scale: Zero    Special needs/care consideration BiPAP/CPAP: No CPM: No Continuous Drip IV:  Dialysis: HD from Fem AV graft on LLE       Days: Mon/Wed/Fri Life Vest: No Oxygen: No Special Bed: No Trach Size: No Wound Vac (area): No      Location: NA Skin: Surgical incision on RLE for BKA  ; stage 1 sacrum                          Bowel mgmt:07/17/17 Incontinent  Bladder mgmt:Incontinent  Diabetic mgmt: Yes     Previous Home Environment Living Arrangements: Alone Available Help at Discharge: Friend(s), Available PRN/intermittently Type of Home: House Home Layout: One level Home Access: Level entry Bathroom Shower/Tub: Engineer, manufacturing systems: Handicapped height Home Care Services: No  Discharge Living Setting Plans for Discharge Living Setting: Patient's home Type of Home at Discharge: House Discharge Home Layout: One level Discharge Home Access: Level entry Discharge Bathroom Shower/Tub: Tub/shower unit Discharge Bathroom Toilet: Handicapped height Discharge Bathroom Accessibility: Yes(Accessible only with RW, not wc accessible due to width ) How Accessible: Accessible via walker Does the  patient have  any problems obtaining your medications?: Yes (Describe)(girlfriend obtains due to transportation issues)  Social/Family/Support Systems Patient Roles: Other (Comment)(brother and friend) Contact Information: (sister vs girlfriend) Anticipated Caregiver: (sister vs girlfriend) Anticipated Caregiver's Contact Information: sister is Charles Vasquez: [phone]; Salvadore Dom is Delores: [phone] Ability/Limitations of Caregiver: girlfriend is 3 yo with need for pt to be Independent assist level Caregiver Availability: Other (Comment)(sister works Systems developer; Psychiatric nurse from [employer]) Discharge Plan Discussed with Primary Caregiver: Yes Is Caregiver In Agreement with Plan?: Yes Does Caregiver/Family have Issues with Lodging/Transportation while Pt is in Rehab?: No Girlfriend Chiropodist) is unable to assist at this time; Sister Special educational needs teacher) and brother willing to provide intermittent supervision and assistance needed  Goals/Additional Needs Patient/Family Goal for Rehab: PT/OT Supervison to Mod I  Expected length of stay: 7-10 days Cultural Considerations: NA Dietary Needs: Renal/Card Modified with fluid restrctions: 1200 mL fluid; Fluids: thin consistency  Equipment Needs: TBD Special Service Needs: HD on MWF from femoral AV graft on LLE Pt/Family Agrees to Admission and willing to participate: Yes Program Orientation Provided & Reviewed with Pt/Caregiver Including Roles  & Responsibilities: Yes(pt, sister, and girlfriend ) Additional Information Needs: (Gf/sister inerested in [facility] services and what that entails. ) Information Needs to be Provided By: SW prior to DC   Barriers to Discharge: Decreased caregiver support, Home environment access/layout, Lack of/limited family support, Hemodialysis  Barriers to Discharge Comments: social support (gf is 37 yo) and sister lives approx 30 mins away and works 5-9pm as a Comptroller   Decrease burden of Care through IP rehab admission: N/A   Possible  need for SNF placement upon discharge:not anticipated, discussed with family (gf, sister, and brother) that insurance is unlikely to admit pt to SNF after CIR stay; all indicated verbal understanding.    Patient Condition: This patient's condition remains as documented in the consult dated 07/16/17, in which the Rehabilitation Physician determined and documented that the patient's condition is appropriate for intensive rehabilitative care in an inpatient rehabilitation facility. Will admit to inpatient rehab today.  Preadmission Screen Completed By:  Charles Vasquez, 07/17/2017 5:30 PM ______________________________________________________________________   Discussed status with Dr. Wynn Banker on 07/17/17 at 5:26pm and received telephone approval for admission today.  Admission Coordinator:  Charles Vasquez, time 5:26pm/Date 07/17/17         Cosigned by: Erick Colace, MD at 07/17/2017 5:40 PM  Revision History

## 2017-07-18 ENCOUNTER — Other Ambulatory Visit: Payer: Self-pay

## 2017-07-18 ENCOUNTER — Inpatient Hospital Stay (HOSPITAL_COMMUNITY): Payer: Medicare Other | Admitting: Occupational Therapy

## 2017-07-18 ENCOUNTER — Inpatient Hospital Stay (HOSPITAL_COMMUNITY): Payer: Medicare Other | Admitting: Physical Therapy

## 2017-07-18 ENCOUNTER — Inpatient Hospital Stay (HOSPITAL_COMMUNITY): Payer: Medicare Other

## 2017-07-18 DIAGNOSIS — D638 Anemia in other chronic diseases classified elsewhere: Secondary | ICD-10-CM

## 2017-07-18 DIAGNOSIS — N186 End stage renal disease: Secondary | ICD-10-CM

## 2017-07-18 DIAGNOSIS — I10 Essential (primary) hypertension: Secondary | ICD-10-CM

## 2017-07-18 DIAGNOSIS — D72829 Elevated white blood cell count, unspecified: Secondary | ICD-10-CM

## 2017-07-18 DIAGNOSIS — D62 Acute posthemorrhagic anemia: Secondary | ICD-10-CM

## 2017-07-18 DIAGNOSIS — Z992 Dependence on renal dialysis: Secondary | ICD-10-CM

## 2017-07-18 DIAGNOSIS — E1142 Type 2 diabetes mellitus with diabetic polyneuropathy: Secondary | ICD-10-CM

## 2017-07-18 LAB — RENAL FUNCTION PANEL
Albumin: 2 g/dL — ABNORMAL LOW (ref 3.5–5.0)
Anion gap: 14 (ref 5–15)
BUN: 21 mg/dL — AB (ref 6–20)
CHLORIDE: 98 mmol/L — AB (ref 101–111)
CO2: 26 mmol/L (ref 22–32)
CREATININE: 5.11 mg/dL — AB (ref 0.61–1.24)
Calcium: 7.6 mg/dL — ABNORMAL LOW (ref 8.9–10.3)
GFR, EST AFRICAN AMERICAN: 11 mL/min — AB (ref >60)
GFR, EST NON AFRICAN AMERICAN: 10 mL/min — AB (ref >60)
Glucose, Bld: 195 mg/dL — ABNORMAL HIGH (ref 65–99)
PHOSPHORUS: 4.5 mg/dL (ref 2.5–4.6)
POTASSIUM: 3.8 mmol/L (ref 3.5–5.1)
Sodium: 138 mmol/L (ref 135–145)

## 2017-07-18 LAB — GLUCOSE, CAPILLARY
GLUCOSE-CAPILLARY: 144 mg/dL — AB (ref 65–99)
GLUCOSE-CAPILLARY: 148 mg/dL — AB (ref 65–99)
Glucose-Capillary: 118 mg/dL — ABNORMAL HIGH (ref 65–99)
Glucose-Capillary: 207 mg/dL — ABNORMAL HIGH (ref 65–99)

## 2017-07-18 LAB — HEPATITIS B SURFACE ANTIGEN: HEP B S AG: NEGATIVE

## 2017-07-18 NOTE — Progress Notes (Signed)
Social Work  Social Work Assessment and Plan  Patient Details  Name: Charles Vasquez MRN: [id number] Date of Birth: February 23, 1936  Today's Date: 07/18/2017  Problem List:  Patient Active Problem List   Diagnosis Date Noted  . 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 Pressure injury of skin 07/13/2017  . Diabetic foot infection (HCC) 07/12/2017  . CAD (coronary artery disease) 07/12/2017  . Type II diabetes mellitus with renal manifestations (HCC) 07/12/2017  . Wound of right foot   . Loose stools 05/20/2017  . Abdominal pain   . Loss of weight 03/18/2017  . Abnormal CT scan, colon 03/18/2017  . Melena 03/18/2017  . Periumbilical abdominal pain 03/18/2017  . ESRD (end stage renal disease) on dialysis (HCC) 10/22/2016  . Venous hypertension status post AVG procedure 08/10/2016  . Anasarca 08/04/2016  . Facial swelling 08/04/2016  . Effusion, other site 08/04/2016  . 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 Acute respiratory failure (HCC) 08/04/2016  . Hyperkalemia 08/04/2016  . Aftercare following surgery of the circulatory system, NEC-Right  AVGG 09/25/2012  . Other complications due to renal dialysis device, implant, and graft 05/01/2012  . End stage renal disease (HCC) 03/17/2012  . FATIGUE, ACUTE 08/22/2008  . HEMATURIA UNSPECIFIED 07/11/2008  . Diabetes (HCC) 01/19/2008  . Essential hypertension 01/19/2008  . MYOCARDIAL INFARCTION, HX OF 01/19/2008  . GERD 01/19/2008  . OSTEOARTHRITIS 01/19/2008  . CEREBROVASCULAR ACCIDENT, HX OF 01/19/2008   Past Medical History:  Past Medical History:  Diagnosis Date  . Anal pain   . AVF (arteriovenous fistula) (HCC) 05-13-2017 per pt currently AVF access used is left thigh   hx multiple AVF surgery's (previously left upper arm, bilateral thigh) last surgery -- right upper arm creation 11-26-2016  . ESRD (end stage renal disease) on dialysis [facility])    [facility] in [place], [place] on MWF   . History  of CVA (cerebrovascular accident)    05-13-2017  per pt stated "thats what they told yrs ago"  per pt no residual  . Hyperlipidemia   . Hypertension   . Myocardial infarction (HCC)   . Stroke (HCC)   . Type 2 diabetes mellitus treated with insulin [facility])    Past Surgical History:  Past Surgical History:  Procedure Laterality Date  . ABDOMINAL AORTOGRAM W/LOWER EXTREMITY N/A 06/25/2017   Procedure: ABDOMINAL AORTOGRAM W/LOWER EXTREMITY;  Surgeon: Maeola Harman, MD;  Location: [facility] INVASIVE CV LAB;  Service: Cardiovascular;  Laterality: N/A;  . AMPUTATION Right 01/01/2017   Procedure: REVISION AMPUTATION RIGHT INDEX FINGER;  Surgeon: Dairl Ponder, MD;  Location: [facility] OR;  Service: Orthopedics;  Laterality: Right;  . AMPUTATION Right 07/15/2017   Procedure: AMPUTATION BELOW KNEE RIGHT;  Surgeon: [name], MD;  Location: [facility] OR;  Service: Vascular;  Laterality: Right;  . ARTERIOVENOUS GRAFT PLACEMENT    . ARTERIOVENOUS GRAFT PLACEMENT Left 10/22/2016   thigh  . AV FISTULA PLACEMENT  03/31/2012   Procedure: ARTERIOVENOUS (AV) FISTULA CREATION;  Surgeon: [name], MD;  Location: [facility] OR;  Service: Vascular;  Laterality: Right;  First stage Brachial vein transposition  . AV FISTULA PLACEMENT Right 08/25/2012   Procedure: INSERTION OF ARTERIOVENOUS (AV) GORE-TEX GRAFT ARM;  Surgeon: [name], MD;  Location: [facility] OR;  Service: Vascular;  Laterality: Right;  . AV FISTULA PLACEMENT Left 07/23/2016   Procedure: INSERTION OF ARTERIOVENOUS (AV) GORE-TEX GRAFT LEFT UPPER ARM;  Surgeon: [name], MD;  Location: [facility] OR;  Service: Vascular;  Laterality: Left;  . AV FISTULA PLACEMENT Left 10/22/2016   Procedure: INSERTION OF 4-17mm x 45cm  ARTERIOVENOUS (AV) GORE-TEX GRAFT THIGH-LEFT;  Surgeon: [name], MD;  Location: [facility] OR;  Service: Vascular;  Laterality: Left;  . CATARACT EXTRACTION W/ INTRAOCULAR LENS  IMPLANT, BILATERAL    . COLONOSCOPY N/A 03/25/2017   Dr. Darrick Penna: examined  portion of ileum normal. Redundant left colon, stricture at anus   . ESOPHAGOGASTRODUODENOSCOPY N/A 03/25/2017   Dr. Darrick Penna: widely patent Schatzki ring at GE junction s/p dilation, atrophic gastritis, single duodenal AVM, non-bleeding  . INSERTION OF DIALYSIS CATHETER Left 05/05/2012   Procedure: INSERTION OF DIALYSIS CATHETER;  Surgeon: Chuck Hint, MD;  Location: [facility] OR;  Service: Vascular;  Laterality: Left;  . INSERTION OF DIALYSIS CATHETER Right 10/01/2016   Procedure: INSERTION OF DIALYSIS CATHETER- RIGHT FEMORAL;  Surgeon: [name], MD;  Location: [facility] OR;  Service: Vascular;  Laterality: Right;  . IR FLUORO GUIDE CV LINE RIGHT  06/10/2016  . IR GENERIC HISTORICAL  06/07/2016   IR US GUIDE VASC ACCESS RIGHT 06/07/2016 Oley Balm, MD [facility]-INTERV RAD  . IR THROMBECTOMY AV FISTULA W/THROMBOLYSIS/PTA INC/SHUNT/IMG RIGHT Right 06/07/2016  . IR US GUIDE VASC ACCESS RIGHT  06/10/2016  . LIGATION ARTERIOVENOUS GORTEX GRAFT Left 08/04/2016   Procedure: LIGATION ARTERIOVENOUS GORTEX GRAFT;  Surgeon: Larina Earthly, MD;  Location: [facility] OR;  Service: Vascular;  Laterality: Left;  . LIGATION ARTERIOVENOUS GORTEX GRAFT Right 11/26/2016   Procedure: LIGATION OF RIGHT UPPER ARM ARTERIOVENOUS GORTEX GRAFT;  Surgeon: [name], MD;  Location: [facility] OR;  Service: Vascular;  Laterality: Right;  . LIGATION OF ARTERIOVENOUS  FISTULA Right 08/25/2012   Procedure: LIGATION OF ARTERIOVENOUS  FISTULA;  Surgeon: [name], MD;  Location: [facility] OR;  Service: Vascular;  Laterality: Right;  Ligation of right brachial vein transposition  . PERIPHERAL VASCULAR ATHERECTOMY  06/25/2017   Procedure: PERIPHERAL VASCULAR ATHERECTOMY;  Surgeon: Maeola Harman, MD;  Location: [facility] INVASIVE CV LAB;  Service: Cardiovascular;;  Rt. PT  . REMOVAL OF A DIALYSIS CATHETER Right 05/05/2012   Procedure: REMOVAL OF A DIALYSIS CATHETER;  Surgeon: Chuck Hint, MD;  Location: [facility] OR;  Service: Vascular;  Laterality: Right;   . REMOVAL OF A DIALYSIS CATHETER Right 10/01/2016   Procedure: REMOVAL OF A DIALYSIS CATHETER-RIGHT UPPER CHEST;  Surgeon: [name], MD;  Location: [facility] OR;  Service: Vascular;  Laterality: Right;  . REMOVAL OF A DIALYSIS CATHETER Right 11/26/2016   Procedure: REMOVAL OF RIGHT FEMORAL TUNNEL DIALYSIS CATHETER;  Surgeon: [name], MD;  Location: [facility] OR;  Service: Vascular;  Laterality: Right;  . SPHINCTEROTOMY N/A 05/15/2017   Procedure: POSSIBLE SPHINCTEROTOMY (BOTOX);  Surgeon: Romie Levee, MD;  Location: [facility];  Service: General;  Laterality: N/A;  . TOE AMPUTATION  02/2007   left foot first 3 toes  . UPPER EXTREMITY VENOGRAPHY Bilateral 07/18/2016   Procedure: Bilateral Upper Extremity Venography;  Surgeon: [name], MD;  Location: [facility] INVASIVE CV LAB;  Service: Cardiovascular;  Laterality: Bilateral;   Social History:  reports that he has never smoked. He has never used smokeless tobacco. He reports that he does not drink alcohol or use drugs.  Family / Support Systems Marital Status: Widow/Widower Patient Roles: Partner, Other (Comment)(Sibling & Friend) Spouse/Significant Other: [name]-girlfriend [phone] Other Supports: [name]-sister [phone]-cell 30 minutes away Anticipated Caregiver: Self Ability/Limitations of Caregiver: [name] is 80 yo and not able to assist  can check on him, sister is a Comptroller and works 5-9pm and lives 30 mintues from pt Caregiver Availability: Other (Comment)(WIll need to come up with a plan if needs care from rehab) Family Dynamics: Pt has a sister and brother and girlfriend who are willing to come by and check on him ,but can not provide 24 hr care. He has managed on his own prior to this surgery and is hopign he will again. He has friends at the HD center  Social History Preferred language: English Religion: Baptist Cultural Background: No issues Education: [employer] Read: Yes Write: Yes Employment  Status: Retired Fish farm manager Issues: No issues Guardian/Conservator: none-according to MD pt is capable of making his own decisions while here   Abuse/Neglect Abuse/Neglect Assessment Can Be Completed: Yes Physical Abuse: Denies Verbal Abuse: Denies Sexual Abuse: Denies Exploitation of patient/patient's resources: Denies Self-Neglect: Denies  Emotional Status Pt's affect, behavior adn adjustment status: Pt is motivated to recover and regain his independence, he will need too, since he will be home alone with others coming in to check on him. He was managing prior to admission and feels he can learn how to be mod/i again.  Recent Psychosocial Issues: other health issues-ESRD Pyschiatric History: No history pt is able to verbalize his concerns and feelings he feels he needs to move forward and learn the new way of doing things. He seems to be coping appropriately with his amputation Substance Abuse History: No issues  Patient / Family Perceptions, Expectations & Goals Pt/Family understanding of illness & functional limitations: Pt can explain his amputation and feels he will feel better now, without that wound to deal with. He was in pain and now it is not that bad. He and his sister have spoken with the MD and feel they are aware of the plan and treatment moving forward Premorbid pt/family roles/activities: Brother, boyfriend, retiree, friend, etc Anticipated changes in roles/activities/participation: resume Pt/family expectations/goals: Pt states: " I need to be able to take care of myself before I go home."  Sister states: " I hope he does well he is one who may surprise you with his progress."  [employer]: Other (Comment)([name]) Premorbid Home Care/DME Agencies: Other (Comment)(RW, tub bench, cane) Transportation available at discharge: Alverda Skeans to HD and girlfriend gets meds for him Resource referrals recommended: Support group  (specify)  Discharge Planning Living Arrangements: Alone Support Systems: Spouse/significant other, Other relatives, Friends/neighbors Type of Residence: Private residence Insurance Resources: Media planner (specify)(UHC-Medicare) Financial Resources: Restaurant manager, fast food Screen Referred: No Living Expenses: Rent Money Management: Patient Does the patient have any problems obtaining your medications?: No Home Management: Self, sister to assist now Patient/Family Preliminary Plans: Return home with siblings and girlfriend coming by to assist with home management and transportation, but pt will need to be mod/i to be able to go home safely. Will await team's evaluation and goals for here and work on a safe discharge plan for pt. Sw Barriers to Discharge: Decreased caregiver support, Home environment access/layout Sw Barriers to Discharge Comments: Does not have 24 hr care and will not fit into the bathroom Social Work Anticipated Follow Up Needs: HH/OP, SNF, Support Group  Clinical Impression Pleasant gentleman who is motivated to do what he needs to do to get back home. He is aware he will need to be mod/i to be able to go home safely. He has siblings and a girlfriend who will make sure he has what he needs but can not  provide care to him. Will await team evaluations and work on a safe plan.  Lucy Chris 07/18/2017, 10:51 AM

## 2017-07-18 NOTE — Progress Notes (Signed)
I contacted [facility], to clarify that patient is now on inpt rehab and Hemodialysis to be scheduled after therapy. They are aware. [phone]

## 2017-07-18 NOTE — Evaluation (Signed)
Occupational Therapy Assessment and Plan  Patient Details  Name: Charles Vasquez MRN: [id number] Date of Birth: [birth year]  OT Diagnosis: acute pain, cognitive deficits, disturbance of vision and muscle weakness (generalized) Rehab Potential: Rehab Potential (ACUTE ONLY): Good ELOS: 12 - 14 days   Today's Date: 07/18/2017 OT Individual Time: 0263-7858 OT Individual Time Calculation (min): 60 min     Problem List:  Patient Active Problem List   Diagnosis Date Noted  . Leukocytosis   . Acute blood loss anemia   . Anemia of chronic disease   . ESRD on dialysis ([place])   . Type 2 diabetes mellitus with peripheral neuropathy (HCC)   . Benign essential HTN   . Amputation of right lower extremity below knee upon examination ([place]) 07/17/2017  . Pressure injury of skin 07/13/2017  . Diabetic foot infection ([place]) 07/12/2017  . CAD (coronary artery disease) 07/12/2017  . Type II diabetes mellitus with renal manifestations ([place]) 07/12/2017  . Wound of right foot   . Loose stools 05/20/2017  . Abdominal pain   . Loss of weight 03/18/2017  . Abnormal CT scan, colon 03/18/2017  . Melena 03/18/2017  . Periumbilical abdominal pain 03/18/2017  . ESRD (end stage renal disease) on dialysis ([place]) 10/22/2016  . Venous hypertension status post AVG procedure 08/10/2016  . Anasarca 08/04/2016  . Facial swelling 08/04/2016  . Effusion, other site 08/04/2016  . 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 Acute respiratory failure ([place]) 08/04/2016  . Hyperkalemia 08/04/2016  . Aftercare following surgery of the circulatory system, NEC-Right  AVGG 09/25/2012  . Other complications due to renal dialysis device, implant, and graft 05/01/2012  . End stage renal disease ([place]) 03/17/2012  . FATIGUE, ACUTE 08/22/2008  . HEMATURIA UNSPECIFIED 07/11/2008  . Diabetes ([place]) 01/19/2008  . Essential hypertension 01/19/2008  . MYOCARDIAL INFARCTION, HX OF 01/19/2008  . GERD 01/19/2008  . OSTEOARTHRITIS 01/19/2008  .  CEREBROVASCULAR ACCIDENT, HX OF 01/19/2008    Past Medical History:  Past Medical History:  Diagnosis Date  . Anal pain   . AVF (arteriovenous fistula) ([place]) 05-13-2017 per pt currently AVF access used is left thigh   hx multiple AVF surgery's (previously left upper arm, bilateral thigh) last surgery -- right upper arm creation 11-26-2016  . ESRD (end stage renal disease) on dialysis [facility])    [place] in [place], [place] on MWF   . History of CVA (cerebrovascular accident)    05-13-2017  per pt stated "thats what they told yrs ago"  per pt no residual  . Hyperlipidemia   . Hypertension   . Myocardial infarction ([place])   . Stroke ([place])   . Type 2 diabetes mellitus treated with insulin [facility])    Past Surgical History:  Past Surgical History:  Procedure Laterality Date  . ABDOMINAL AORTOGRAM W/LOWER EXTREMITY N/A 06/25/2017   Procedure: ABDOMINAL AORTOGRAM W/LOWER EXTREMITY;  Surgeon: Waynetta Sandy, MD;  Location: [place] CV LAB;  Service: Cardiovascular;  Laterality: N/A;  . AMPUTATION Right 01/01/2017   Procedure: REVISION AMPUTATION RIGHT INDEX FINGER;  Surgeon: Charlotte Crumb, MD;  Location: [place];  Service: Orthopedics;  Laterality: Right;  . AMPUTATION Right 07/15/2017   Procedure: AMPUTATION BELOW KNEE RIGHT;  Surgeon: Conrad Steuben, MD;  Location: [place];  Service: Vascular;  Laterality: Right;  . ARTERIOVENOUS GRAFT PLACEMENT    . ARTERIOVENOUS GRAFT PLACEMENT Left 10/22/2016   thigh  . AV FISTULA PLACEMENT  03/31/2012   Procedure: ARTERIOVENOUS (AV) FISTULA CREATION;  Surgeon: Jannette Fogo  Bridgett Larsson, MD;  Location[facility] OR;  Service: Vascular;  Laterality: Right;  First stage Brachial vein transposition  . AV FISTULA PLACEMENT Right 08/25/2012   Procedure: INSERTION OF ARTERIOVENOUS (AV) GORE-TEX GRAFT ARM;  Surgeon: Conrad [place], MD;  Location: [place];  Service: Vascular;  Laterality: Right;  . AV FISTULA PLACEMENT Left 07/23/2016   Procedure: INSERTION OF ARTERIOVENOUS  (AV) GORE-TEX GRAFT LEFT UPPER ARM;  Surgeon: Conrad Niles, MD;  Location: [place];  Service: Vascular;  Laterality: Left;  . AV FISTULA PLACEMENT Left 10/22/2016   Procedure: INSERTION OF 4-51m x 45cm  ARTERIOVENOUS (AV) GORE-TEX GRAFT THIGH-LEFT;  Surgeon: CConrad [place] Ridge MD;  [place]  Service: Vascular;  Laterality: Left;  . CATARACT EXTRACTION W/ INTRAOCULAR LENS  IMPLANT, BILATERAL    . COLONOSCOPY N/A 03/25/2017   Dr. FOneida Alar examined portion of ileum normal. Redundant left colon, stricture at anus   . ESOPHAGOGASTRODUODENOSCOPY N/A 03/25/2017   Dr. FOneida Alar widely patent Schatzki ring at GE junction s/p dilation, atrophic gastritis, single duodenal AVM, non-bleeding  . INSERTION OF DIALYSIS CATHETER Left 05/05/2012   Procedure: INSERTION OF DIALYSIS CATHETER;  Surgeon: CAngelia Mould MD;  [place]  Service: Vascular;  Laterality: Left;  . INSERTION OF DIALYSIS CATHETER Right 10/01/2016   Procedure: INSERTION OF DIALYSIS CATHETER- RIGHT FEMORAL;  Surgeon: CConrad [place]crest MD;  [place]  Service: Vascular;  Laterality: Right;  . IR FLUORO GUIDE CV LINE RIGHT  06/10/2016  . IR GENERIC HISTORICAL  06/07/2016   IR [place] ACCESS RIGHT 06/07/2016 DArne Cleveland MD [facility]-INTERV RAD  . IR THROMBECTOMY AV FISTULA W/THROMBOLYSIS/PTA INC/SHUNT/IMG RIGHT Right 06/07/2016  . IR [place] VASC ACCESS RIGHT  06/10/2016  . LIGATION ARTERIOVENOUS GORTEX GRAFT Left 08/04/2016   Procedure: LIGATION ARTERIOVENOUS GORTEX GRAFT;  Surgeon: ERosetta Posner MD;  [place]  Service: Vascular;  Laterality: Left;  . LIGATION ARTERIOVENOUS GORTEX GRAFT Right 11/26/2016   Procedure: LIGATION OF RIGHT UPPER ARM ARTERIOVENOUS GORTEX GRAFT;  Surgeon: CConrad Octa MD;  [place]  Service: Vascular;  Laterality: Right;  . LIGATION OF ARTERIOVENOUS  FISTULA Right 08/25/2012   Procedure: LIGATION OF ARTERIOVENOUS  FISTULA;  Surgeon: BConrad Concordia MD;  [place]  Service: Vascular;   Laterality: Right;  Ligation of right brachial vein transposition  . PERIPHERAL VASCULAR ATHERECTOMY  06/25/2017   Procedure: PERIPHERAL VASCULAR ATHERECTOMY;  Surgeon: CWaynetta Sandy MD;  [place];  Service: Cardiovascular;;  Rt. PT  . REMOVAL OF A DIALYSIS CATHETER Right 05/05/2012   Procedure: REMOVAL OF A DIALYSIS CATHETER;  Surgeon: CAngelia Mould MD;  [place]  Service: Vascular;  Laterality: Right;  . REMOVAL OF A DIALYSIS CATHETER Right 10/01/2016   Procedure: REMOVAL OF A DIALYSIS CATHETER-RIGHT UPPER CHEST;  Surgeon: CConrad Freer MD;  [place]  Service: Vascular;  Laterality: Right;  . REMOVAL OF A DIALYSIS CATHETER Right 11/26/2016   Procedure: REMOVAL OF RIGHT FEMORAL TUNNEL DIALYSIS CATHETER;  Surgeon: CConrad [place] MD;  [place]  Service: Vascular;  Laterality: Right;  . SPHINCTEROTOMY N/A 05/15/2017   Procedure: POSSIBLE SPHINCTEROTOMY (BOTOX);  Surgeon: TLeighton Ruff MD;  [facility]  Service: General;  Laterality: N/A;  . TOE AMPUTATION  02/2007   left foot first 3 toes  . UPPER EXTREMITY VENOGRAPHY Bilateral 07/18/2016   Procedure: Bilateral Upper Extremity Venography;  Surgeon: CConrad  MD;  Location: [place] CV LAB;  Service: Cardiovascular;  Laterality: Bilateral;    Assessment & Plan Clinical Impression:   Khaliel Morey is a 82 year old right-handed male with history of hypertension, CAD/MI end-stage renal disease with hemodialysis Monday Wednesdays and Fridays at [facility] in [facility], diabetes mellitus.  Patient lives alone.  He has a girlfriend that checks on him daily and a sister in the area.  One level home.  Resented 07/12/2017 with chronic right wound infection progressive ischemic changes.  Patient with recent right lower extremity angiogram with attempted revascularization of right posterior vertebral artery was unsuccessful 5 weeks ago.  Limb was  not felt to be salvageable and underwent right BKA 07/15/2017 per Dr. Bridgett Larsson.  Hospital course pain management.  Hemodialysis ongoing as per renal services.  Subcutaneous heparin for DVT prophylaxis.  Acute on chronic anemia 8.9 and received 2 units packed red blood cells and monitored.  Blood cultures with Proteus/strep currently maintained on Rocephin.  Follow-up of left lower extremity arterial duplex completed showing mild plaque noted throughout no evidence of significant stenosis.  Physical and occupational therapy evaluations completed with recommendations of physical medicine rehab consult.  Patient was admitted for a comprehensive rehab program  Patient transferred to CIR on 07/17/2017 .    Patient currently requires max with basic self-care skills secondary to muscle weakness, decreased cardiorespiratoy endurance, field cut, decreased awareness, decreased problem solving, decreased memory and delayed processing and decreased sitting balance, decreased standing balance and decreased balance strategies.  Prior to hospitalization, patient could complete ADL with modified independent .  Patient will benefit from skilled intervention to increase independence with basic self-care skills prior to discharge home with care partner.  Anticipate patient will require 24 hour supervision and follow up home health.  OT - End of Session Activity Tolerance: Tolerates 10 - 20 min activity with multiple rests Endurance Deficit: Yes Endurance Deficit Description: needs frequent rest breaks OT Assessment Rehab Potential (ACUTE ONLY): Good OT Barriers to Discharge: Decreased caregiver support OT Barriers to Discharge Comments: pt does have family that will be with him part of the time, but need to ensure family understands pt will need 24/7 S OT Patient demonstrates impairments in the following area(s): Balance;Cognition;Endurance;Motor;Pain;Vision OT Basic ADL's Functional Problem(s):  Bathing;Dressing;Toileting;Grooming OT Transfers Functional Problem(s): Toilet;Tub/Shower OT Additional Impairment(s): None OT Plan OT Intensity: Minimum of 1-2 x/day, 45 to 90 minutes OT Frequency: 5 out of 7 days OT Duration/Estimated Length of Stay: 12 - 14 days OT Treatment/Interventions: Balance/vestibular training;Cognitive remediation/compensation;Discharge planning;DME/adaptive equipment instruction;Functional mobility training;Patient/family education;Psychosocial support;Splinting/orthotics;Therapeutic Exercise;Self Care/advanced ADL retraining;Visual/perceptual remediation/compensation;Therapeutic Activities;UE/LE Strength taining/ROM;UE/LE Coordination activities OT Self Feeding Anticipated Outcome(s): independent OT Basic Self-Care Anticipated Outcome(s): supervision OT Toileting Anticipated Outcome(s): supervision OT Bathroom Transfers Anticipated Outcome(s): supervision OT Recommendation Patient destination: Home Follow Up Recommendations: Home health OT Equipment Recommended: 3 in 1 bedside comode;Tub/shower bench   Skilled Therapeutic Intervention Pt seen for initial evaluation and ADL training. Educated pt on purpose of OT and OT POC.  Pt was having difficulty recalling when things had happened to him or what exactly happened. For ex, pt has very restricted L sh ROM and he thought his arm was working very well up until 2 weeks ago. He does not recall a fall or other event that would have caused this problem.  He was having orientation difficulties, but was able to follow basic commands and attend well in therapy.  Pt needs max A with transfers and mod-max to rise to stand  due to weak UEs and weak LLE.  Pt participated well but due to his cognition and vision difficulties recommended to pt that he have 24/7 S at home. Pt did not object and felt that was possible.  Pt resting in w/c with chair alarm on and all needs met.  OT Evaluation Precautions/Restrictions   Precautions Precautions: Fall Restrictions Weight Bearing Restrictions: Yes RLE Weight Bearing: Non weight bearing   Pain  7/10 R limb, RN provided medication Home Living/Prior Functioning Home Living Family/patient expects to be discharged to:: Private residence Living Arrangements: Alone Available Help at Discharge: Friend(s), Available PRN/intermittently Type of Home: House Home Access: Stairs to enter(2 low steps with no rail) Home Layout: One level Bathroom Shower/Tub: Tub/shower unit, Curtain  Lives With: Alone Prior Function Level of Independence: Independent with gait, Independent with transfers, Independent with basic ADLs, Independent with homemaking with ambulation Driving: No Vocation: Retired Leisure: Hobbies-yes (Comment) Comments: enjoys listening to [employer] ADL ADL ADL Comments: refer to functional navigator Vision Baseline Vision/History: (pt reports recent L eye sx where his eye was partially stitched closed) Patient Visual Report: No change from baseline Vision Assessment?: Vision impaired- to be further tested in functional context;Yes Eye Alignment: Within Functional Limits Ocular Range of Motion: (pt unable to keep focus on object for testing) Alignment/Gaze Preference: Within Defined Limits Tracking/Visual Pursuits: Unable to hold eye position out of midline;Requires cues, head turns, or add eye shifts to track;Decreased smoothness of horizontal tracking;Decreased smoothness of vertical tracking Visual Fields: Left visual field deficit(L eye partially stitched closed) Perception  Perception: Within Functional Limits Praxis Praxis: Intact Cognition Overall Cognitive Status: No family/caregiver present to determine baseline cognitive functioning Arousal/Alertness: Awake/alert Orientation Level: Person;Situation;Other(comment)(pt stated [facility] even after being corrected) Year: Other (Comment)(1995 1st answer, then 1999) Month:  July Day of Week: Correct(pt stated Saturday, then said oh I mean Friday) Memory: Impaired Memory Impairment: Decreased recall of new information Immediate Memory Recall: Sock;Blue;Bed Memory Recall: Sock;Blue;Bed Memory Recall Sock: With Cue Memory Recall Blue: With Cue Memory Recall Bed: With Cue Attention: Sustained Sustained Attention: Appears intact Problem Solving: Impaired Safety/Judgment: Impaired Sensation Sensation Light Touch: Appears Intact Stereognosis: Appears Intact Hot/Cold: Appears Intact Proprioception: Appears Intact Coordination Gross Motor Movements are Fluid and Coordinated: No Fine Motor Movements are Fluid and Coordinated: No(movement patterns slow) Motor  Motor Motor: Within Functional Limits Motor - Skilled Clinical Observations: generalized motor weakness Mobility    refer to functional navigator Trunk/Postural Assessment  Cervical Assessment Cervical Assessment: Within Functional Limits Thoracic Assessment Thoracic Assessment: Within Functional Limits Lumbar Assessment Lumbar Assessment: Exceptions to WFL(posterior pelvic tilt) Postural Control Postural Control: Within Functional Limits  Balance Balance Balance Assessed: Yes Static Sitting Balance Static Sitting - Balance Support: No upper extremity supported;Feet supported Static Sitting - Level of Assistance: 5: Stand by assistance Dynamic Sitting Balance Dynamic Sitting - Balance Support: Feet supported;No upper extremity supported Dynamic Sitting - Level of Assistance: 4: Min Insurance risk surveyor Standing - Balance Support: Bilateral upper extremity supported Static Standing - Level of Assistance: 3: Mod assist Extremity/Trunk Assessment RUE Assessment RUE Assessment: Exceptions to WFL(shoulder flexion AROM 120 degrees, 4-/5 distal strength) LUE Assessment LUE Assessment: Exceptions to WFL(distal strength 4-/5) LUE AROM (degrees) Left Shoulder Flexion: 60  Degrees(pt tends to guard with PROM )   See Function Navigator for Current Functional Status.   Refer to Care Plan for Long Term Goals  Recommendations for other services: None    Discharge Criteria: Patient will be  discharged from OT if patient refuses treatment 3 consecutive times without medical reason, if treatment goals not met, if there is a change in medical status, if patient makes no progress towards goals or if patient is discharged from hospital.  The above assessment, treatment plan, treatment alternatives and goals were discussed and mutually agreed upon: by patient  [place] 07/18/2017, 1:20 PM

## 2017-07-18 NOTE — Care Management Note (Signed)
Inpatient Rehabilitation Center Individual Statement of Services  Patient Name:  Charles Vasquez  Date:  07/18/2017  Welcome to the Inpatient Rehabilitation Center.  Our goal is to provide you with an individualized program based on your diagnosis and situation, designed to meet your specific needs.  With this comprehensive rehabilitation program, you will be expected to participate in at least 3 hours of rehabilitation therapies Monday-Friday, with modified therapy programming on the weekends.  Your rehabilitation program will include the following services:  Physical Therapy (PT), Occupational Therapy (OT), 24 hour per day rehabilitation nursing, Therapeutic Recreaction (TR), Case Management (Social Worker), Rehabilitation Medicine, Nutrition Services and Pharmacy Services  Weekly team conferences will be held on Wednesday to discuss your progress.  Your Social Worker will talk with you frequently to get your input and to update you on team discussions.  Team conferences with you and your family in attendance may also be held.  Expected length of stay: 12-16 days Overall anticipated outcome: supervision with cueing  Depending on your progress and recovery, your program may change. Your Social Worker will coordinate services and will keep you informed of any changes. Your Social Worker's name and contact numbers are listed  below.  The following services may also be recommended but are not provided by the Inpatient Rehabilitation Center:   Driving Evaluations  Home Health Rehabiltiation Services  Outpatient Rehabilitation Services    Arrangements will be made to provide these services after discharge if needed.  Arrangements include referral to agencies that provide these services.  Your insurance has been verified to be:  [employer]-Medicare Your primary doctor is:  Lia Hopping  Pertinent information will be shared with your doctor and your insurance company.  Social Worker:  Dossie Der,  SW [phone] or [phone]  Information discussed with and copy given to patient by: Lucy Chris, 07/18/2017, 10:54 AM

## 2017-07-18 NOTE — Progress Notes (Signed)
[place] PHYSICAL MEDICINE & REHABILITATION     PROGRESS NOTE  Subjective/Complaints:  Pt seen lying in bed this AM.  He states he slept well overnight.  He is ready to begin therapies.    ROS: Denies CP, SOB, N/V/D.  Objective: Vital Signs: Blood pressure (!) 165/68, pulse 73, temperature 98.1 F (36.7 C), temperature source Oral, resp. rate 18, SpO2 98 %. No results found. Recent Labs    07/17/17 0409 07/17/17 1937  WBC 11.2* 12.8*  HGB 8.9* 8.7*  HCT 28.4* 28.1*  PLT 202 201   Recent Labs    07/17/17 0409 07/17/17 1937  NA 134* 133*  K 4.1 4.3  CL 97* 96*  GLUCOSE 120* 111*  BUN 24* 24*  CREATININE 5.35* 6.85*  5.44*  CALCIUM 7.0* 6.9*   CBG (last 3)  Recent Labs    07/17/17 2123 07/18/17 0807 07/18/17 1200  GLUCAP 136* 118* 144*    Wt Readings from Last 3 Encounters:  07/17/17 55.4 kg (122 lb 2.2 oz)  07/11/17 59 kg (130 lb)  06/25/17 59 kg (130 lb)    Physical Exam:  BP (!) 165/68 (BP Location: Left Arm)   Pulse 73   Temp 98.1 F (36.7 C) (Oral)   Resp 18   SpO2 98%  Constitutional: He appearswell-developed. Frail. HENT: Normocephalic. Atraumatic. Eyes:EOMare normal. No discharge. Cardiovascular:Normal rateand regular rhythm. No JVD. Respiratory:Effort normaland breath sounds normal.  HR:CBULA sounds are normal. He exhibitsno distension.  MSK: Right BKA edema and tenderness.  RUE: amputation of 2nd digit at PIP LLE: amputation digits  Neurological: He isalertand oriented x2, confused ?Visual disturbance. Motor: B/l UE, LLE, Right hip flexion 4+/5 proximal to distal Sensation absent Left LE below knee Skin: BKA with staples c/d/i  Assessment/Plan: 1. Functional deficits secondary to right BKA which require 3+ hours per day of interdisciplinary therapy in a comprehensive inpatient rehab setting. Physiatrist is providing close team supervision and 24 hour management of active medical problems listed below. Physiatrist and  rehab team continue to assess barriers to discharge/monitor patient progress toward functional and medical goals.  Function:  Bathing Bathing position      Bathing parts      Bathing assist        Upper Body Dressing/Undressing Upper body dressing                    Upper body assist        Lower Body Dressing/Undressing Lower body dressing                                  Lower body assist        Toileting Toileting          Toileting assist     Transfers Chair/bed transfer   Chair/bed transfer method: Squat pivot Chair/bed transfer assist level: Maximal assist (Pt 25 - 49%/lift and lower) Chair/bed transfer assistive device: Armrests     Locomotion Ambulation Ambulation activity did not occur: Safety/medical concerns         Wheelchair   Type: Manual Max wheelchair distance: 58' Assist Level: Touching or steadying assistance (Pt > 75%)  Cognition Comprehension Comprehension assist level: Understands basic 90% of the time/cues < 10% of the time  Expression Expression assist level: Expresses basic 90% of the time/requires cueing < 10% of the time.  Social Interaction Social Interaction assist level: Interacts appropriately 75 - 89% of  the time - Needs redirection for appropriate language or to initiate interaction.  Problem Solving Problem solving assist level: Solves basic 75 - 89% of the time/requires cueing 10 - 24% of the time  Memory Memory assist level: Recognizes or recalls 75 - 89% of the time/requires cueing 10 - 24% of the time    Medical Problem List and Plan: 1.Decreased functional mobilitysecondary to right BKA 07/15/2017  Begin CIR  Will order shrinker  Notes reviewed, labs reviewed 2. DVT Prophylaxis/Anticoagulation: Subcutaneous heparin. Monitor for any bleeding episodes 3. Pain Management:Ultram as needed 4. Mood:Provide emotional support 5. Neuropsych: This patientis?fully capable of making decisions on  hisown behalf. 6. Skin/Wound Care:Routine skin checks 7. Fluids/Electrolytes/Nutrition:Routine in and outs  8.Acute on chronic anemia. Continue Niferex  Hb 8.7 on 5/9  Cont to monitor 9.End-stage renal disease with hemodialysis. Follow-up renal services 10.Diabetes mellitus with peripheral neuropathy. Hemoglobin A1c 7.1.   Lantus insulin 8 units daily.   Check blood sugars before meals and at bedtime.   Diabetic teaching  Monitor with increased mobility 11.ID. Blood cultures Proteus/strep.   Continue course of Rocephin 12.Hypertension. Currently holding losartan due to hypotension.   Monitor with increased mobility 13.Constipation. Laxative assistance 14. Leukocytosis  WBCs 12.8 on 5/9  Afrebile  Cont to monitor  LOS (Days) 1 A FACE TO FACE EVALUATION WAS PERFORMED  Ankit Karis Juba 07/18/2017 12:12 PM

## 2017-07-18 NOTE — Progress Notes (Signed)
Patient information reviewed and entered into eRehab system by Emlyn Maves, RN, CRRN, PPS Coordinator.  Information including medical coding and functional independence measure will be reviewed and updated through discharge.     Per nursing patient was given "Data Collection Information Summary for Patients in Inpatient Rehabilitation Facilities with attached "Privacy Act Statement-Health Care Records" upon admission.  

## 2017-07-18 NOTE — Progress Notes (Signed)
Physical Therapy Assessment and Plan  Patient Details  Name: Charles Vasquez MRN: [id number] Date of Birth: 1935-11-26  PT Diagnosis: Abnormality of gait, Cognitive deficits, Difficulty walking, Impaired cognition and Muscle weakness Rehab Potential: Good ELOS: 14-16 days   Today's Date: 07/18/2017 PT Individual Time: 1100-1200 PT Individual Time Calculation (min): 60 min    Problem List:  Patient Active Problem List   Diagnosis Date Noted  . Amputation of right lower extremity below knee upon examination ([place]) 07/17/2017  . Pressure injury of skin 07/13/2017  . Diabetic foot infection ([place]) 07/12/2017  . CAD (coronary artery disease) 07/12/2017  . Type II diabetes mellitus with renal manifestations ([place]) 07/12/2017  . Wound of right foot   . Loose stools 05/20/2017  . Abdominal pain   . Loss of weight 03/18/2017  . Abnormal CT scan, colon 03/18/2017  . Melena 03/18/2017  . Periumbilical abdominal pain 03/18/2017  . ESRD (end stage renal disease) on dialysis ([place]) 10/22/2016  . Venous hypertension status post AVG procedure 08/10/2016  . Anasarca 08/04/2016  . Facial swelling 08/04/2016  . Effusion, other site 08/04/2016  . 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 Acute respiratory failure ([place]) 08/04/2016  . Hyperkalemia 08/04/2016  . Aftercare following surgery of the circulatory system, NEC-Right  AVGG 09/25/2012  . Other complications due to renal dialysis device, implant, and graft 05/01/2012  . End stage renal disease ([place]) 03/17/2012  . FATIGUE, ACUTE 08/22/2008  . HEMATURIA UNSPECIFIED 07/11/2008  . Diabetes ([place]) 01/19/2008  . Essential hypertension 01/19/2008  . MYOCARDIAL INFARCTION, HX OF 01/19/2008  . GERD 01/19/2008  . OSTEOARTHRITIS 01/19/2008  . CEREBROVASCULAR ACCIDENT, HX OF 01/19/2008    Past Medical History:  Past Medical History:  Diagnosis Date  . Anal pain   . AVF (arteriovenous fistula) ([place]) 05-13-2017 per pt currently AVF access used is left thigh   hx  multiple AVF surgery's (previously left upper arm, bilateral thigh) last surgery -- right upper arm creation 11-26-2016  . ESRD (end stage renal disease) on dialysis [facility])    [facility] in [place], [place] on MWF   . History of CVA (cerebrovascular accident)    05-13-2017  per pt stated "thats what they told yrs ago"  per pt no residual  . Hyperlipidemia   . Hypertension   . Myocardial infarction ([place])   . Stroke ([place])   . Type 2 diabetes mellitus treated with insulin [facility])    Past Surgical History:  Past Surgical History:  Procedure Laterality Date  . ABDOMINAL AORTOGRAM W/LOWER EXTREMITY N/A 06/25/2017   Procedure: ABDOMINAL AORTOGRAM W/LOWER EXTREMITY;  Surgeon: Waynetta Sandy, MD;  Location: [place] CV LAB;  Service: Cardiovascular;  Laterality: N/A;  . AMPUTATION Right 01/01/2017   Procedure: REVISION AMPUTATION RIGHT INDEX FINGER;  Surgeon: Charlotte Crumb, MD;  Location: [place];  Service: Orthopedics;  Laterality: Right;  . AMPUTATION Right 07/15/2017   Procedure: AMPUTATION BELOW KNEE RIGHT;  Surgeon: Conrad Entiat, MD;  Location: [place];  Service: Vascular;  Laterality: Right;  . ARTERIOVENOUS GRAFT PLACEMENT    . ARTERIOVENOUS GRAFT PLACEMENT Left 10/22/2016   thigh  . AV FISTULA PLACEMENT  03/31/2012   Procedure: ARTERIOVENOUS (AV) FISTULA CREATION;  Surgeon: Conrad Sarepta, MD;  Location: [place];  Service: Vascular;  Laterality: Right;  First stage Brachial vein transposition  . AV FISTULA PLACEMENT Right 08/25/2012   Procedure: INSERTION OF ARTERIOVENOUS (AV) GORE-TEX GRAFT ARM;  Surgeon: Conrad Weston, MD;  Location: [place];  Service: Vascular;  Laterality: Right;  . AV FISTULA PLACEMENT Left 07/23/2016   Procedure: INSERTION OF ARTERIOVENOUS (AV) GORE-TEX GRAFT LEFT UPPER ARM;  Surgeon: Conrad Prescott, MD;  Location: [place];  Service: Vascular;  Laterality: Left;  . AV FISTULA PLACEMENT Left 10/22/2016   Procedure: INSERTION OF 4-35m x 45cm  ARTERIOVENOUS (AV)  GORE-TEX GRAFT THIGH-LEFT;  Surgeon: CConrad Fonda MD;  [place]  Service: Vascular;  Laterality: Left;  . CATARACT EXTRACTION W/ INTRAOCULAR LENS  IMPLANT, BILATERAL    . COLONOSCOPY N/A 03/25/2017   Dr. FOneida Alar examined portion of ileum normal. Redundant left colon, stricture at anus   . ESOPHAGOGASTRODUODENOSCOPY N/A 03/25/2017   Dr. FOneida Alar widely patent Schatzki ring at GE junction s/p dilation, atrophic gastritis, single duodenal AVM, non-bleeding  . INSERTION OF DIALYSIS CATHETER Left 05/05/2012   Procedure: INSERTION OF DIALYSIS CATHETER;  Surgeon: CAngelia Mould MD;  [place]  Service: Vascular;  Laterality: Left;  . INSERTION OF DIALYSIS CATHETER Right 10/01/2016   Procedure: INSERTION OF DIALYSIS CATHETER- RIGHT FEMORAL;  Surgeon: CConrad Piney MD;  [place]  Service: Vascular;  Laterality: Right;  . IR FLUORO GUIDE CV LINE RIGHT  06/10/2016  . IR GENERIC HISTORICAL  06/07/2016   IR [place] ACC[place] RIGHT 06/07/2016 DArne Cleveland MD [facility]-INTERV RAD  . IR THROMBECTOMY AV FISTULA W/THROMBOLYSIS/PTA INC/SHUNT/IMG RIGHT Right 06/07/2016  . IR [place] VASC ACCESS RIGHT  06/10/2016  . LIGATION ARTERIOVENOUS GORTEX GRAFT Left 08/04/2016   Procedure: LIGATION ARTERIOVENOUS GORTEX GRAFT;  Surgeon: ERosetta Posner MD;  [place]  Service: Vascular;  Laterality: Left;  . LIGATION ARTERIOVENOUS GORTEX GRAFT Right 11/26/2016   Procedure: LIGATION OF RIGHT UPPER ARM ARTERIOVENOUS GORTEX GRAFT;  Surgeon: CConrad [place] MD;  [place]  Service: Vascular;  Laterality: Right;  . LIGATION OF ARTERIOVENOUS  FISTULA Right 08/25/2012   Procedure: LIGATION OF ARTERIOVENOUS  FISTULA;  Surgeon: BConrad Cicero MD;  [place]  Service: Vascular;  Laterality: Right;  Ligation of right brachial vein transposition  . PERIPHERAL VASCULAR ATHERECTOMY  06/25/2017   Procedure: PERIPHERAL VASCULAR ATHERECTOMY;  Surgeon: CWaynetta Sandy MD;  [place];  Service: Cardiovascular;;  Rt. PT  . REMOVAL OF A DIALYSIS CATHETER Right 05/05/2012   Procedure: REMOVAL OF A DIALYSIS CATHETER;  Surgeon: CAngelia Mould MD;  [place]  Service: Vascular;  Laterality: Right;  . REMOVAL OF A DIALYSIS CATHETER Right 10/01/2016   Procedure: REMOVAL OF A DIALYSIS CATHETER-RIGHT UPPER CHEST;  Surgeon: CConrad [place] MD;  [place]  Service: Vascular;  Laterality: Right;  . REMOVAL OF A DIALYSIS CATHETER Right 11/26/2016   Procedure: REMOVAL OF RIGHT FEMORAL TUNNEL DIALYSIS CATHETER;  Surgeon: CConrad Duncannon MD;  [place]  Service: Vascular;  Laterality: Right;  . SPHINCTEROTOMY N/A 05/15/2017   Procedure: POSSIBLE SPHINCTEROTOMY (BOTOX);  Surgeon: TLeighton Ruff MD;  [facility]  Service: General;  Laterality: N/A;  . TOE AMPUTATION  02/2007   left foot first 3 toes  . UPPER EXTREMITY VENOGRAPHY Bilateral 07/18/2016   Procedure: Bilateral Upper Extremity Venography;  Surgeon: CConrad  MD;  [place];  Service: Cardiovascular;  Laterality: Bilateral;    Assessment & Plan Clinical Impression: RZed Wanningeris a 82year old right-handed male with history of hypertension, CAD/MI end-stage renal disease [facility]  diabetes mellitus. Patient lives alone. He has a girlfriend that checks on him daily and a sister in the area. One level home. Resented 07/12/2017 with chronic right wound infection progressive ischemic changes. Patient with recent right lower extremity angiogram with attempted revascularization of right posterior vertebral artery was unsuccessful 5 weeks ago. 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 Bridgett Larsson.Hospital course pain management. Hemodialysis ongoing as per renal services. Subcutaneous heparin for DVT prophylaxis. Acute on chronic anemia 8.9 and  received 2 units packed red blood cells and monitored. Blood cultures with Proteus/strep currently maintained on Rocephin. Follow-up of left lower extremity arterial duplex completed showing mild plaque noted throughout no evidence of significant stenosis. Physical and occupational therapy evaluations completed with recommendations of physical medicine rehab consult. Patient was admitted for a comprehensive rehab program Patient transferred to CIR on 07/17/2017 .   Patient currently requires max with mobility secondary to muscle weakness, decreased cardiorespiratoy endurance, decreased problem solving, decreased safety awareness and decreased memory and decreased sitting balance and decreased standing balance.  Prior to hospitalization, patient was modified independent  with mobility and lived with Alone in a House home.  Home access is  Level entry.  Patient will benefit from skilled PT intervention to maximize safe functional mobility, minimize fall risk and decrease caregiver burden for planned discharge home with 24 hour assist.  Anticipate patient will benefit from follow up [facility] at discharge.  PT - End of Session Activity Tolerance: Tolerates 10 - 20 min activity with multiple rests Endurance Deficit: Yes Endurance Deficit Description: needs frequent rest breaks PT Assessment Rehab Potential (ACUTE/IP ONLY): Good PT Barriers to Discharge: Decreased caregiver support;Lack of/limited family support;Weight bearing restrictions PT Plan PT Intensity: Minimum of 1-2 x/day ,45 to 90 minutes PT Frequency: 5 out of 7 days PT Duration Estimated Length of Stay: 14-16 days PT Treatment/Interventions: Ambulation/gait training;Balance/vestibular training;Cognitive remediation/compensation;Community reintegration;Discharge planning;Disease management/prevention;DME/adaptive equipment instruction;Functional mobility training;Pain management;Patient/family education;Psychosocial  support;Splinting/orthotics;Therapeutic Activities;Therapeutic Exercise;UE/LE Strength taining/ROM;UE/LE Coordination activities;Visual/perceptual remediation/compensation;Wheelchair propulsion/positioning PT Recommendation Recommendations for Other Services: Speech consult Follow Up Recommendations: Home health PT;24 hour supervision/assistance Patient destination: Home Equipment Recommended: Wheelchair (measurements);Wheelchair cushion (measurements) Equipment Details: pt already owns RW and [phone]  Skilled Therapeutic Intervention Evaluation completed (see details above and below) with education on PT POC and goals and individual treatment initiated with focus functional transfers, w/c mobility, orientation to rehab unit and schedule, education on R residual limb positioning and phantom limb pain. No complaints of pain. Sit to stand with maxA in // bars with L knee blocked for safety, pt tolerates standing about 1 min before onset of fatigue with modA for standing balance and manual cues for upright posture. Squat pivot transfer w/c to/from mat table with maxA with max verbal and manual cues for safe transfer technique. Pt displays cognitive deficits and struggles with following more than one-step verbal commands as well as needing increased time to follow cues. Per pt report and pt chart he has history of CVA but no documented date or specifics. Sit to/from supine with minA for trunk control. Manual w/c propulsion x 75 ft with minA for steering. Pt left seated in w/c in room with chair alarm in place, needs in reach.  PT Evaluation Precautions/Restrictions Precautions Precautions: Fall Restrictions Weight Bearing Restrictions: Yes RLE Weight Bearing: Non weight bearing Home Living/Prior Functioning Home Living Living Arrangements: Alone Available Help at Discharge: Friend(s);Available PRN/intermittently Type of Home: House Home Access: Level entry Home Layout: One level  Lives  With:  Alone Prior Function Level of Independence: Independent with gait;Independent with transfers Driving: No Vocation: Retired Leisure: Hobbies-yes (Comment) Comments: enjoys listening to [employer] Vision/Perception  Vision - Assessment Vision Assessment: Vision impaired - to be further tested in functional context(per pt report cannot see L periphery) Perception Perception: Within Functional Limits Praxis Praxis: Intact  Cognition Overall Cognitive Status: No family/caregiver present to determine baseline cognitive functioning Arousal/Alertness: Awake/alert Orientation Level: Oriented to person;Oriented to situation;Oriented to place Attention: Sustained Sustained Attention: Appears intact Memory: Impaired Memory Impairment: Decreased recall of new information Problem Solving: Impaired Safety/Judgment: Impaired Sensation Sensation Light Touch: Appears Intact Proprioception: Appears Intact Coordination Gross Motor Movements are Fluid and Coordinated: No Motor  Motor Motor: Within Functional Limits  Trunk/Postural Assessment  Cervical Assessment Cervical Assessment: Within Functional Limits Thoracic Assessment Thoracic Assessment: Within Functional Limits Lumbar Assessment Lumbar Assessment: Exceptions to WFL(posterior pelvic tilt) Postural Control Postural Control: Within Functional Limits  Balance Balance Balance Assessed: Yes Static Sitting Balance Static Sitting - Balance Support: No upper extremity supported;Feet supported Static Sitting - Level of Assistance: 5: Stand by assistance Dynamic Sitting Balance Dynamic Sitting - Balance Support: Feet supported;No upper extremity supported Dynamic Sitting - Level of Assistance: 4: Min Insurance risk surveyor Standing - Balance Support: Bilateral upper extremity supported Static Standing - Level of Assistance: 3: Mod assist Extremity Assessment   RLE Assessment RLE Assessment: Exceptions to [facility] RLE  Strength Right Hip Flexion: 4/5 Right Knee Flexion: 4/5 Right Knee Extension: 4/5 LLE Assessment LLE Assessment: Exceptions to WFL(transmet amp of first 3 toes LLE) LLE Strength Left Hip Flexion: 4/5 Left Knee Flexion: 3-/5 Left Knee Extension: 3+/5 Left Ankle Dorsiflexion: 3+/5   See Function Navigator for Current Functional Status.   Refer to Care Plan for Long Term Goals  Recommendations for other services: Other: Speech Therapy  Discharge Criteria: Patient will be discharged from PT if patient refuses treatment 3 consecutive times without medical reason, if treatment goals not met, if there is a change in medical status, if patient makes no progress towards goals or if patient is discharged from hospital.  The above assessment, treatment plan, treatment alternatives and goals were discussed and mutually agreed upon: by patient  Excell Seltzer, PT, DPT  07/18/2017, 12:15 PM

## 2017-07-18 NOTE — Progress Notes (Signed)
  [facility] Progress Note    Assessment/ Plan:   82 yo male with ESRD on MWF HD, HTN, HLD, DM, stroke, GERD, CAD, PVD who presented with a R foot infection now s/p R BK and doing well.  1. ESRD on MWF HD. HD today. Continue home renvela. 2. 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 Narrowed to ceftriaxone per primary. 3. HTN. BPs improved, would restart losartan  4. Anemia. Hgb stable. Continue home iron supplementation.  5. Diabetes per primary  Subjective:   Doing well and feels good overall. No complaints today.   Objective:   BP (!) 165/68 (BP Location: Left Arm)   Pulse 73   Temp 98.1 F (36.7 C) (Oral)   Resp 18   SpO2 98%   Intake/Output Summary (Last 24 hours) at 07/18/2017 0729 Last data filed at 07/17/2017 2200 Gross per 24 hour  Intake 120 ml  Output 125 ml  Net -5 ml   Weight change:   Physical Exam: Gen: laying in bed, in NAD, awake and alert CVS: regular rate and rhythm, normal S1 and S2, no murmurs Resp:CTAB, normal effort on room air Abd: soft, nontender, nondistended Ext: R LE s/p BKA incision clean with good healing, nontender to palpation   Imaging: No results found.  Labs: BMET Recent Labs  Lab 07/11/17 1754 07/13/17 0248 07/15/17 0418 07/15/17 1036 07/16/17 0351 07/17/17 0409 07/17/17 1937  NA 133* 134* 132* 135 133* 134* 133*  K 4.6 5.0 4.7 4.3 5.1 4.1 4.3  CL 92* 94* 93*  --  98* 97* 96*  CO2 27 25 26   --  22 28 27   GLUCOSE 387* 248* 291* 267* 226* 120* 111*  BUN 29* 50* 33*  --  47* 24* 24*  CREATININE 5.43* 8.23* 6.69*  --  8.31* 5.35* 6.85*  5.44*  CALCIUM 7.5* 7.5* 7.3*  --  6.9* 7.0* 6.9*  PHOS  --   --   --   --   --   --  3.9   CBC Recent Labs  Lab 07/11/17 1754 07/13/17 0248 07/15/17 0418  07/15/17 1620 07/16/17 0351 07/17/17 0409 07/17/17 1937  WBC 15.6* 18.5* 16.4*  --   --  12.6* 11.2* 12.8*  NEUTROABS 13.6* 16.7*  --   --   --   --  9.0*  --   HGB 10.1* 9.7* 9.0*   < > 8.9* 9.4* 8.9* 8.7*  HCT  33.0* 31.4* 30.1*   < > 28.9* 29.8* 28.4* 28.1*  MCV 86.2 86.0 86.7  --   --  85.9 87.4 87.3  PLT 202 222 236  --   --  184 202 201   < > = values in this interval not displayed.    Medications:    . dicyclomine  10 mg Oral BID WC  . heparin  5,000 Units Subcutaneous Q8H  . insulin aspart  0-9 Units Subcutaneous TID WC  . insulin glargine  8 Units Subcutaneous QHS  . iron polysaccharides  150 mg Oral BID  . multivitamin  1 tablet Oral Daily  . pantoprazole  40 mg Oral BID WC  . sevelamer carbonate  1,600 mg Oral TID WC      Leland Her, DO PGY-2, [facility] 07/18/2017 7:29 AM

## 2017-07-18 NOTE — Progress Notes (Signed)
45 min missed d/t pt at dialysis. OT will f/u.

## 2017-07-18 NOTE — Plan of Care (Signed)
LBM 07/16/17

## 2017-07-18 NOTE — Progress Notes (Signed)
Orthopedic Tech Progress Note Patient Details:  Charles Vasquez [birth year] [id number]  Patient ID: [name], male   DOB: August 16, 1935, 82 y.o.   MRN: [id number]   [name] [employer] for stump shrinnker x2 07/18/2017, 12:51 PM

## 2017-07-18 NOTE — Progress Notes (Signed)
Occupational Therapy Session Note  Patient Details  Name: Charles Vasquez MRN: [id number] Date of Birth: 01-01-1936  Today's Date: 07/18/2017 OT Individual Time: 1000-1030 OT Individual Time Calculation (min): 30 min    Skilled Therapeutic Interventions/Progress Updates:    1:1 Focus on propelling w/c for coordination and cardiopulmonary endurance. Pt with no recall of having a previous CVA however pt with limited left UE shoulder ROM and coordination. Pt able to functionally reach about 40 degrees and able to grasp light resistant closepin and place on second row bar. Pt with no recall of why his left eye lid in sewn shut. Pt reports is July 2099 and we are at [facility]. Perform 2 sit to stands at high low table with mod A with extra time. Left sitting up in the w/c  Therapy Documentation Precautions:  Precautions Precautions: (P) Fall Restrictions Weight Bearing Restrictions: (P) Yes RLE Weight Bearing: (P) Non weight bearing Pain: No c/o in session  See Function Navigator for Current Functional Status.   Therapy/Group: Individual Therapy  Roney Mans [facility] 07/18/2017, 11:43 AM

## 2017-07-19 ENCOUNTER — Inpatient Hospital Stay (HOSPITAL_COMMUNITY): Payer: Medicare Other | Admitting: Occupational Therapy

## 2017-07-19 ENCOUNTER — Inpatient Hospital Stay (HOSPITAL_COMMUNITY): Payer: Medicare Other | Admitting: Physical Therapy

## 2017-07-19 DIAGNOSIS — I739 Peripheral vascular disease, unspecified: Secondary | ICD-10-CM

## 2017-07-19 DIAGNOSIS — M25512 Pain in left shoulder: Secondary | ICD-10-CM

## 2017-07-19 LAB — GLUCOSE, CAPILLARY
Glucose-Capillary: 117 mg/dL — ABNORMAL HIGH (ref 65–99)
Glucose-Capillary: 147 mg/dL — ABNORMAL HIGH (ref 65–99)
Glucose-Capillary: 159 mg/dL — ABNORMAL HIGH (ref 65–99)
Glucose-Capillary: 177 mg/dL — ABNORMAL HIGH (ref 65–99)

## 2017-07-19 MED ORDER — MUSCLE RUB 10-15 % EX CREA
TOPICAL_CREAM | Freq: Two times a day (BID) | CUTANEOUS | Status: DC
  Administered 2017-07-19: 1 via TOPICAL
  Administered 2017-07-19 – 2017-07-24 (×9): via TOPICAL
  Filled 2017-07-19: qty 85

## 2017-07-19 NOTE — Progress Notes (Signed)
  [facility] Progress Note    Assessment/ Plan:   82 yo male with ESRD on MWF HD, HTN, HLD, DM, stroke, GERD, CAD, PVD who presented with a R foot infection now s/p R BK and doing well.  1. ESRD on MWF HD. Received HD yesterday, next Monday. Continue home renvela. 2. 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 Narrowed to ceftriaxone per primary. 3. HTN. BPs improved, would restart losartan  4. Anemia. Hgb stable. Continue home iron supplementation.  5. Diabetes per primary  Subjective:   States feels well, feels has been doing better with rehab. No complaints.   Objective:   BP (!) 163/62 (BP Location: Left Arm)   Pulse 73   Temp 98.1 F (36.7 C) (Oral)   Resp 16   Wt 123 lb 14.4 oz (56.2 kg)   SpO2 95%   BMI 19.41 kg/m   Intake/Output Summary (Last 24 hours) at 07/19/2017 0837 Last data filed at 07/18/2017 2200 Gross per 24 hour  Intake 480 ml  Output 50 ml  Net 430 ml   Weight change:   Physical Exam: Gen: sitting up in side of bed, in NAD, awake and alert CVS: regular rate and rhythm, normal S1 and S2, no murmurs Resp:CTAB, normal effort on room air Abd: soft, nontender, nondistended Ext: R LE s/p BKA   Imaging: No results found.  Labs: BMET Recent Labs  Lab 07/13/17 0248 07/15/17 0418 07/15/17 1036 07/16/17 0351 07/17/17 0409 07/17/17 1937 07/18/17 2141  NA 134* 132* 135 133* 134* 133* 138  K 5.0 4.7 4.3 5.1 4.1 4.3 3.8  CL 94* 93*  --  98* 97* 96* 98*  CO2 25 26  --  22 28 27 26   GLUCOSE 248* 291* 267* 226* 120* 111* 195*  BUN 50* 33*  --  47* 24* 24* 21*  CREATININE 8.23* 6.69*  --  8.31* 5.35* 6.85*  5.44* 5.11*  CALCIUM 7.5* 7.3*  --  6.9* 7.0* 6.9* 7.6*  PHOS  --   --   --   --   --  3.9 4.5   CBC Recent Labs  Lab 07/13/17 0248 07/15/17 0418  07/15/17 1620 07/16/17 0351 07/17/17 0409 07/17/17 1937  WBC 18.5* 16.4*  --   --  12.6* 11.2* 12.8*  NEUTROABS 16.7*  --   --   --   --  9.0*  --   HGB 9.7* 9.0*   < > 8.9* 9.4*  8.9* 8.7*  HCT 31.4* 30.1*   < > 28.9* 29.8* 28.4* 28.1*  MCV 86.0 86.7  --   --  85.9 87.4 87.3  PLT 222 236  --   --  184 202 201   < > = values in this interval not displayed.    Medications:    . dicyclomine  10 mg Oral BID WC  . heparin  5,000 Units Subcutaneous Q8H  . insulin aspart  0-9 Units Subcutaneous TID WC  . insulin glargine  8 Units Subcutaneous QHS  . iron polysaccharides  150 mg Oral BID  . multivitamin  1 tablet Oral Daily  . pantoprazole  40 mg Oral BID WC  . sevelamer carbonate  1,600 mg Oral TID WC      Leland Her, DO PGY-2, [facility] 07/19/2017 8:37 AM

## 2017-07-19 NOTE — Progress Notes (Signed)
Physical Therapy Session Note  Patient Details  Name: Charles Vasquez MRN: [id number] Date of Birth: [birth year]  Today's Date: 07/19/2017 PT Individual Time: 1022-1132 PT Individual Time Calculation (min): 70 min   Short Term Goals: Week 1:  PT Short Term Goal 1 (Week 1): Pt will complete squat pivot transfer with modA consistently PT Short Term Goal 2 (Week 1): Pt will propel manual w/c x 100 ft with SBA PT Short Term Goal 3 (Week 1): Pt will perform sit to stand with modA consistently  Skilled Therapeutic Interventions/Progress Updates:  Pt received in w/c & agreeable to tx. Pt reports pain in RLE & LUE - RN aware & administered medication. Pt with little ability to push or reach with LUE 2/2 pain (reports pain began ~1 month ago). Pt reports need to use restroom and completes stand pivot w/c<>elevated toilet with grab bars, arm rests, max assist and max cuing for hand placement, sequencing and technique. Pt with incontinent BM in brief & continent BM & void on toilet. Pt attempted peri hygiene with encouragement and education to lean to complete task. Pt requires one person to assist with sit>stand and 2nd person to pull brief & pants over hips. Pt returns to w/c in same manner as noted prior. Pt performed hand hygiene at sink from w/c level with cuing and assist to use soap. Pt propels w/c room>gym with BUE & supervision. Pt completes quad sets & long arc quads with max multimodal cuing for sequencing to achieve full ROM knee extension (pt unable to achieve 0 degrees knee extension).  Therapist re-wrapped RLE to reduce areas of extra ace wrap on end of residual limb.  Focused on w/c<>mat table transfers with therapist providing demonstration and assistance with w/c set up. Placed co-ban on legrest buttons to provide tactile feedback to allow pt to move them out of the way. Pt with impaired vision and sensation in BUE hands and has great difficulty swinging leg rests out of the way and pushing button  to move armrest - at this point it's anticipated pt will need assistance with these tasks 2/2 impairments. Pt completes w/c<>mat table via squat pivot with mod assist.  Pt propels w/c between cones to simulate small spaces in home environment, initially requiring max cuing for obstacle avoidance but fading to min with pt taking his time during task. At end of session pt left sitting in w/c in room with chair alarm & quick release belt donned, all needs within reach.  Therapy Documentation Precautions:  Precautions Precautions: Fall Restrictions Weight Bearing Restrictions: Yes RLE Weight Bearing: Non weight bearing   See Function Navigator for Current Functional Status.   Therapy/Group: Individual Therapy  Sandi Mariscal 07/19/2017, 11:38 AM

## 2017-07-19 NOTE — Progress Notes (Signed)
Occupational Therapy Session Note  Patient Details  Name: Charles Vasquez MRN: [id number] Date of Birth: 15-May-1935  Today's Date: 07/19/2017 OT Individual Time: 9201-0071 and 2197-5883 OT Individual Time Calculation (min): 56 min and 55 min  Short Term Goals: Week 1:  OT Short Term Goal 1 (Week 1): Pt will be able to transfer on and off a toilet with mod A. OT Short Term Goal 2 (Week 1): Pt will be able to consistently sit to stand at sink with min A. OT Short Term Goal 3 (Week 1): Pt will be able to don shirt with mod A or less. OT Short Term Goal 4 (Week 1): Pt will be able to don pants over feet with min A. OT Short Term Goal 5 (Week 1): Pt will bathe with min A.  Skilled Therapeutic Interventions/Progress Updates:    Pt greeted supine in bed with RN present. Agreeable to B/D. Tx focus on balance, sit<stands, Lt NMR, and functional transfers during self care completion EOB. Mod A sit<stand with DME as needed. Pt required assist for pericare/elevating pants over hips while standing. He also required assist to reach Lt foot. Scoot transfer completed to w/c with Min-Mod A and then he completed grooming/oral care tasks w/c level at sink. He mistook the lotion bottle for toothpaste and required cuing to recognize. Afterwards we practiced squat-scoot TTB transfers, which he completed with Min-Mod A and safety cuing. For remainder of session, worked on [employer] shoulder exercises with thermotherapy to decrease pain and increase functional ROM. RN made aware of pts Lt shoulder pain. At end of tx pt was left with all needs, safety belt fastened, and chair alarm set.       2nd Session 1:1 tx (55 min) Pt greeted in w/c. ADL needs met and ready to go. Worked on [employer] in dayroom with use of MHP for AAROM exercises. Pt requiring HOH for carryover of exercise techniques (due to deficits with vision, cognition or proprioception). Very limited with shoulder ER. Focused on gentle dynamic movements  within pain free region to increase ROM. Due to Lt scapular winging, OT provided manual guidance at scapula to improve scapulothoracic rhythm. Also completed shoulder isometrics with pt pressing against wall for scapular stabilization. For remainder of session, worked on pt standing with unilateral support on elevated table while engaged in modified towel folding activity. Pt requiring demonstration and HOH for understanding and carryover of novel task. Mod A sit<stand x2. At end of session pt was returned to room and left with chair alarm set, safety belt fastened, and all needs.   Per pt, Lt UE pain pre-tx 9/10, post tx 5/10  Therapy Documentation Precautions:  Precautions Precautions: Fall Restrictions Weight Bearing Restrictions: Yes RLE Weight Bearing: Non weight bearing Pain: Lt shoulder, reportedly decreased after AAROM exercises. RN made aware   ADL: ADL ADL Comments: refer to functional navigator     See Function Navigator for Current Functional Status.   Therapy/Group: Individual Therapy  Decklan Mau A Krist Rosenboom 07/19/2017, 12:42 PM

## 2017-07-19 NOTE — Progress Notes (Addendum)
[name]     PROGRESS NOTE  Subjective/Complaints:  Pt up with OT. Complains of left shoulder pain which has been going on for a month. Stump feels ok  ROS: Patient denies fever, rash, sore throat, blurred vision, nausea, vomiting, diarrhea, cough, shortness of breath or chest pain, joint or back pain, headache, or mood change.    Objective: Vital Signs: Blood pressure (!) 163/62, pulse 73, temperature 98.1 F (36.7 C), temperature source Oral, resp. rate 16, weight 56.2 kg (123 lb 14.4 oz), SpO2 95 %. No results found. Recent Labs    07/17/17 0409 07/17/17 1937  WBC 11.2* 12.8*  HGB 8.9* 8.7*  HCT 28.4* 28.1*  PLT 202 201   Recent Labs    07/17/17 1937 07/18/17 2141  NA 133* 138  K 4.3 3.8  CL 96* 98*  GLUCOSE 111* 195*  BUN 24* 21*  CREATININE 6.85*  5.44* 5.11*  CALCIUM 6.9* 7.6*   CBG (last 3)  Recent Labs    07/18/17 1900 07/18/17 2220 07/19/17 0641  GLUCAP 148* 207* 117*    Wt Readings from Last 3 Encounters:  07/18/17 56.2 kg (123 lb 14.4 oz)  07/17/17 55.4 kg (122 lb 2.2 oz)  07/11/17 59 kg (130 lb)    Physical Exam:  BP (!) 163/62 (BP Location: Left Arm)   Pulse 73   Temp 98.1 F (36.7 C) (Oral)   Resp 16   Wt 56.2 kg (123 lb 14.4 oz)   SpO2 95%   BMI 19.41 kg/m  Constitutional: No distress . Vital signs reviewed. HEENT: EOMI, oral membranes moist Neck: supple Cardiovascular: RRR without murmur. No JVD    Respiratory: CTA Bilaterally without wheezes or rales. Normal effort    GI: BS +, non-tender, non-distended  MSK: Right BKA edema and tenderness. Left shoulder ttp palpation and with basic ER/IR.  RUE: amputation of 2nd digit at PIP LLE: amputation digits  Neurological: He isalertand oriented x2, confused ?Visual disturbance. Motor: B/l UE, LLE, Right hip flexion 4+/5 proximal to distal Sensation absent Left LE below knee Skin: BKA with staples c/d/i  Assessment/Plan: 1. Functional deficits  secondary to right BKA which require 3+ hours per day of interdisciplinary therapy in a comprehensive inpatient rehab setting. Physiatrist is providing close team supervision and 24 hour management of active medical problems listed below. Physiatrist and rehab team continue to assess barriers to discharge/monitor patient progress toward functional and medical goals.  Function:  Bathing Bathing position   Position: Wheelchair/chair at sink  Bathing parts Body parts bathed by patient: Chest, Abdomen, Right arm, Left arm Body parts bathed by helper: Right upper leg, Left upper leg, Left lower leg, Back  Bathing assist        Upper Body Dressing/Undressing Upper body dressing   What is the patient wearing?: Pull over shirt/dress       Pull over shirt/dress - Perfomed by helper: Thread/unthread right sleeve, Thread/unthread left sleeve, Put head through opening, Pull shirt over trunk        Upper body assist        Lower Body Dressing/Undressing Lower body dressing   What is the patient wearing?: Pants, Non-skid slipper socks       Pants- Performed by helper: Thread/unthread right pants leg, Thread/unthread left pants leg, Pull pants up/down   Non-skid slipper socks- Performed by helper: Don/doff left sock                  Lower body  assist Assist for lower body dressing: 2 Helpers      Toileting Toileting          Toileting assist     Transfers Chair/bed transfer   Chair/bed transfer method: Squat pivot Chair/bed transfer assist level: Maximal assist (Pt 25 - 49%/lift and lower) Chair/bed transfer assistive device: Armrests     Locomotion Ambulation Ambulation activity did not occur: Safety/medical concerns         Wheelchair   Type: Manual Max wheelchair distance: 51' Assist Level: Touching or steadying assistance (Pt > 75%)  Cognition Comprehension Comprehension assist level: Understands basic 90% of the time/cues < 10% of the time  Expression  Expression assist level: Expresses basic 90% of the time/requires cueing < 10% of the time.  Social Interaction Social Interaction assist level: Interacts appropriately 90% of the time - Needs monitoring or encouragement for participation or interaction.  Problem Solving Problem solving assist level: Solves basic 75 - 89% of the time/requires cueing 10 - 24% of the time  Memory Memory assist level: Recognizes or recalls 50 - 74% of the time/requires cueing 25 - 49% of the time    Medical Problem List and Plan: 1.Decreased functional mobilitysecondary to right BKA 07/15/2017  Continue therapies  shrinker    2. DVT Prophylaxis/Anticoagulation: Subcutaneous heparin. Monitor for any bleeding episodes 3. Pain Management:Ultram as needed  -may have left RTC tendonitis/bursitis   -muscle rub, kpad for left shoulder   -ROM with OT 4. Mood:Provide emotional support 5. Neuropsych: This patientis?fully capable of making decisions on hisown behalf. 6. Skin/Wound Care:Routine skin checks 7. Fluids/Electrolytes/Nutrition:Routine in and outs  8.Acute on chronic anemia. Continue Niferex  Hb 8.7 on 5/9  Cont to monitor 9.End-stage renal disease with hemodialysis. Follow-up renal services 10.Diabetes mellitus with peripheral neuropathy. Hemoglobin A1c 7.1.   Lantus insulin 8 units daily.   Might benefit from  a.m. Lantus as sugars higher in the p.m. 11.ID. Blood cultures Proteus/strep.   Continue course of Rocephin 12.Hypertension. Currently holding losartan due to hypotension.   Monitor with increased mobility 13.Constipation. Laxative assistance 14. Leukocytosis  WBCs 12.8 on 5/9  Afrebile  Cont to monitor  LOS (Days) 2 A FACE TO FACE EVALUATION WAS PERFORMED  Charles Vasquez 07/19/2017 8:19 AM

## 2017-07-20 ENCOUNTER — Inpatient Hospital Stay (HOSPITAL_COMMUNITY): Payer: Medicare Other | Admitting: Occupational Therapy

## 2017-07-20 LAB — GLUCOSE, CAPILLARY
GLUCOSE-CAPILLARY: 41 mg/dL — AB (ref 65–99)
GLUCOSE-CAPILLARY: 111 mg/dL — AB (ref 65–99)
GLUCOSE-CAPILLARY: 271 mg/dL — AB (ref 65–99)
Glucose-Capillary: 41 mg/dL — CL (ref 65–99)
Glucose-Capillary: 79 mg/dL (ref 65–99)
Glucose-Capillary: 185 mg/dL — ABNORMAL HIGH (ref 65–99)

## 2017-07-20 LAB — IRON AND TIBC
Iron: 56 ug/dL (ref 45–182)
Saturation Ratios: 49 % — ABNORMAL HIGH (ref 17.9–39.5)
TIBC: 115 ug/dL — AB (ref 250–450)
UIBC: 59 ug/dL

## 2017-07-20 MED ORDER — DARBEPOETIN ALFA 100 MCG/0.5ML IJ SOSY
100.0000 ug | PREFILLED_SYRINGE | INTRAMUSCULAR | Status: DC
  Filled 2017-07-20: qty 0.5

## 2017-07-20 MED ORDER — INSULIN GLARGINE 100 UNIT/ML ~~LOC~~ SOLN
4.0000 [IU] | Freq: Every day | SUBCUTANEOUS | Status: DC
  Administered 2017-07-20 – 2017-07-21 (×2): 4 [IU] via SUBCUTANEOUS
  Filled 2017-07-20 (×2): qty 0.04

## 2017-07-20 MED ORDER — GLUCOSE 40 % PO GEL
ORAL | Status: AC
  Filled 2017-07-20: qty 1

## 2017-07-20 MED ORDER — INSULIN GLARGINE 100 UNIT/ML ~~LOC~~ SOLN
6.0000 [IU] | SUBCUTANEOUS | Status: DC
  Administered 2017-07-21 – 2017-07-23 (×2): 6 [IU] via SUBCUTANEOUS
  Filled 2017-07-20 (×3): qty 0.06

## 2017-07-20 NOTE — Progress Notes (Signed)
Hypoglycemic Event  CBG: 41  Treatment: 1 tube instant glucose  Symptoms: None  Follow-up CBG: Time:0731 CBG Result:79 Possible Reasons for Event: Inadequate meal intake  Comments/MD notified:yes    Mclane Arora, [employer]

## 2017-07-20 NOTE — Progress Notes (Signed)
[facility] ROUNDING NOTE   Subjective:   Interval History: no complaints this morning appears to be doing well  Brief history 82 yo male with ESRD on MWF HD, HTN, HLD, DM, stroke, GERD, CAD, PVD who presented with a R foot infection now s/p R BK and doing well    Objective:  Vital signs in last 24 hours:  Temp:  [97.7 F (36.5 C)-98.2 F (36.8 C)] 98.2 F (36.8 C) (05/12 0503) Pulse Rate:  [73-76] 74 (05/12 0503) Resp:  [18] 18 (05/12 0503) BP: (159-179)/(57-84) 159/57 (05/12 0503) SpO2:  [98 %-100 %] 100 % (05/12 0503)  Weight change:  Filed Weights   07/18/17 1815  Weight: 123 lb 14.4 oz (56.2 kg)    Intake/Output: I/O last 3 completed shifts: In: 540 [P.O.:540] Out: 50 [Urine:50]   Intake/Output this shift:  No intake/output data recorded.  CVS- RRR  JVP not elevated  RS- CTA  Lungs clear  ABD- BS present soft non-distended EXT- R LE s/p BKA   Digit amputations      Basic Metabolic Panel: Recent Labs  Lab 07/15/17 0418 07/15/17 1036 07/16/17 0351 07/17/17 0409 07/17/17 1937 07/18/17 2141  NA 132* 135 133* 134* 133* 138  K 4.7 4.3 5.1 4.1 4.3 3.8  CL 93*  --  98* 97* 96* 98*  CO2 26  --  22 28 27 26   GLUCOSE 291* 267* 226* 120* 111* 195*  BUN 33*  --  47* 24* 24* 21*  CREATININE 6.69*  --  8.31* 5.35* 6.85*  5.44* 5.11*  CALCIUM 7.3*  --  6.9* 7.0* 6.9* 7.6*  PHOS  --   --   --   --  3.9 4.5    Liver Function Tests: Recent Labs  Lab 07/17/17 1937 07/18/17 2141  ALBUMIN 1.9* 2.0*   No results for input(s): LIPASE, AMYLASE in the last 168 hours. No results for input(s): AMMONIA in the last 168 hours.  CBC: Recent Labs  Lab 07/15/17 0418 07/15/17 1036 07/15/17 1620 07/16/17 0351 07/17/17 0409 07/17/17 1937  WBC 16.4*  --   --  12.6* 11.2* 12.8*  NEUTROABS  --   --   --   --  9.0*  --   HGB 9.0* 7.8* 8.9* 9.4* 8.9* 8.7*  HCT 30.1* 23.0* 28.9* 29.8* 28.4* 28.1*  MCV 86.7  --   --  85.9 87.4 87.3  PLT 236  --   --   184 202 201    Cardiac Enzymes: No results for input(s): CKTOTAL, CKMB, CKMBINDEX, TROPONINI in the last 168 hours.  BNP: Invalid input(s): POCBNP  CBG: Recent Labs  Lab 07/19/17 1638 07/19/17 2056 07/20/17 0649 07/20/17 0707 07/20/17 0731  GLUCAP 159* 177* 41* 41* 79    Microbiology: Results for orders placed or performed during the hospital encounter of 07/11/17  Culture, blood (routine x 2)     Status: Abnormal   Collection Time: 07/12/17  3:47 AM  Result Value Ref Range Status   Specimen Description BLOOD LEFT ANTECUBITAL  Final   Special Requests   Final    BOTTLES DRAWN AEROBIC AND ANAEROBIC Blood Culture adequate volume   Culture  Setup Time   Final    GRAM POSITIVE RODS ANAEROBIC BOTTLE ONLY CRITICAL RESULT CALLED TO, READ BACK BY AND VERIFIED WITH: M MACCIA,PHARMD AT 1614 07/12/17 BY L BENFIELD GRAM NEGATIVE RODS GRAM POSITIVE COCCI IN CHAINS IN BOTH AEROBIC AND ANAEROBIC BOTTLES Performed at [facility], [address],  [place], [place] [address]    Culture (A)  Final    PROTEUS SPECIES STREPTOCOCCUS PARASANGUINIS CLOSTRIDIUM SPECIES    Report Status 07/17/2017 FINAL  Final   Organism ID, Bacteria PROTEUS SPECIES  Final   Organism ID, Bacteria STREPTOCOCCUS PARASANGUINIS  Final      Susceptibility   Streptococcus parasanguinis - MIC*    PENICILLIN 0.12 SENSITIVE Sensitive     CEFTRIAXONE 0.25 SENSITIVE Sensitive     ERYTHROMYCIN <=0.12 SENSITIVE Sensitive     LEVOFLOXACIN 1 SENSITIVE Sensitive     VANCOMYCIN 0.5 SENSITIVE Sensitive     * STREPTOCOCCUS PARASANGUINIS   Proteus species - MIC*    AMPICILLIN >=32 RESISTANT Resistant     CEFAZOLIN >=64 RESISTANT Resistant     CEFEPIME <=1 SENSITIVE Sensitive     CEFTAZIDIME <=1 SENSITIVE Sensitive     CEFTRIAXONE <=1 SENSITIVE Sensitive     CIPROFLOXACIN <=0.25 SENSITIVE Sensitive     GENTAMICIN <=1 SENSITIVE Sensitive     IMIPENEM 8 INTERMEDIATE Intermediate     TRIMETH/SULFA <=20 SENSITIVE  Sensitive     AMPICILLIN/SULBACTAM <=2 SENSITIVE Sensitive     PIP/TAZO <=4 SENSITIVE Sensitive     * PROTEUS SPECIES  Blood Culture ID Panel (Reflexed)     Status: Abnormal   Collection Time: 07/12/17  3:47 AM  Result Value Ref Range Status   Enterococcus species NOT DETECTED NOT DETECTED Final   Listeria monocytogenes NOT DETECTED NOT DETECTED Final   Staphylococcus species NOT DETECTED NOT DETECTED Final   Staphylococcus aureus NOT DETECTED NOT DETECTED Final   Streptococcus species DETECTED (A) NOT DETECTED Final    Comment: Not Enterococcus species, Streptococcus agalactiae, Streptococcus pyogenes, or Streptococcus pneumoniae. CRITICAL RESULT CALLED TO, READ BACK BY AND VERIFIED WITH: M MACCIA,PHARMD AT 1614 07/12/17 BY L BENFIELD    Streptococcus agalactiae NOT DETECTED NOT DETECTED Final   Streptococcus pneumoniae NOT DETECTED NOT DETECTED Final   Streptococcus pyogenes NOT DETECTED NOT DETECTED Final   Acinetobacter baumannii NOT DETECTED NOT DETECTED Final   Enterobacteriaceae species DETECTED (A) NOT DETECTED Final    Comment: Enterobacteriaceae represent a large family of gram-negative bacteria, not a single organism. CRITICAL RESULT CALLED TO, READ BACK BY AND VERIFIED WITH: M MACCIA,PHARMD AT 1614 07/12/17 BY L BENFIELD    Enterobacter cloacae complex NOT DETECTED NOT DETECTED Final   Escherichia coli NOT DETECTED NOT DETECTED Final   Klebsiella oxytoca NOT DETECTED NOT DETECTED Final   Klebsiella pneumoniae NOT DETECTED NOT DETECTED Final   Proteus species DETECTED (A) NOT DETECTED Final    Comment: CRITICAL RESULT CALLED TO, READ BACK BY AND VERIFIED WITH: M MACCIA,PHARMD AT 1614 07/12/17 BY L BENFIELD    Serratia marcescens NOT DETECTED NOT DETECTED Final   Carbapenem resistance NOT DETECTED NOT DETECTED Final   Haemophilus influenzae NOT DETECTED NOT DETECTED Final   Neisseria meningitidis NOT DETECTED NOT DETECTED Final   Pseudomonas aeruginosa NOT DETECTED NOT  DETECTED Final   Candida albicans NOT DETECTED NOT DETECTED Final   Candida glabrata NOT DETECTED NOT DETECTED Final   Candida krusei NOT DETECTED NOT DETECTED Final   Candida parapsilosis NOT DETECTED NOT DETECTED Final   Candida tropicalis NOT DETECTED NOT DETECTED Final    Comment: Performed at [facility], [address], [place], [place] [address]  MRSA PCR Screening     Status: None   Collection Time: 07/12/17  5:26 AM  Result Value Ref Range Status   MRSA by PCR NEGATIVE NEGATIVE Final  Comment:        The GeneXpert MRSA Assay (FDA approved for NASAL specimens only), is one component of a comprehensive MRSA colonization surveillance program. It is not intended to diagnose MRSA infection nor to guide or monitor treatment for MRSA infections. Performed at [facility], [address], [place], [place] [address]   Culture, blood (routine x 2)     Status: None   Collection Time: 07/12/17  7:58 AM  Result Value Ref Range Status   Specimen Description BLOOD LEFT HAND  Final   Special Requests   Final    BOTTLES DRAWN AEROBIC ONLY Blood Culture results may not be optimal due to an inadequate volume of blood received in culture bottles   Culture   Final    NO GROWTH 5 DAYS Performed at [facility], [address] [place], [place] [address]    Report Status 07/17/2017 FINAL  Final  Surgical pcr screen     Status: None   Collection Time: 07/15/17 12:16 AM  Result Value Ref Range Status   MRSA, PCR NEGATIVE NEGATIVE Final   Staphylococcus aureus NEGATIVE NEGATIVE Final    Comment: (NOTE) The Xpert SA Assay (FDA approved for NASAL specimens in patients 54 years of age and older), is one component of a comprehensive surveillance program. It is not intended to diagnose infection nor to guide or monitor treatment. Performed at [facility], [address] [place], [place] [address]     Coagulation Studies: No results for input(s): LABPROT, INR  in the last 72 hours.  Urinalysis: No results for input(s): COLORURINE, LABSPEC, PHURINE, GLUCOSEU, HGBUR, BILIRUBINUR, KETONESUR, PROTEINUR, UROBILINOGEN, NITRITE, LEUKOCYTESUR in the last 72 hours.  Invalid input(s): APPERANCEUR    Imaging: No results found.   Medications:   . cefTRIAXone (ROCEPHIN)  IV 2 g (07/19/17 1712)   . dextrose      . dicyclomine  10 mg Oral BID WC  . heparin  5,000 Units Subcutaneous Q8H  . insulin aspart  0-9 Units Subcutaneous TID WC  . insulin glargine  4 Units Subcutaneous Daily  . insulin glargine  6 Units Subcutaneous Q M,W,F-2000  . iron polysaccharides  150 mg Oral BID  . multivitamin  1 tablet Oral Daily  . MUSCLE RUB   Topical BID  . pantoprazole  40 mg Oral BID WC  . sevelamer carbonate  1,600 mg Oral TID WC   acetaminophen **OR** acetaminophen, alum & mag hydroxide-simeth, calcium carbonate, loperamide, ondansetron **OR** ondansetron (ZOFRAN) IV, phenol, polyvinyl alcohol, senna-docusate, sevelamer carbonate, sorbitol, traMADol  Assessment/ Plan:  1.ESRD on MWF HD.  2.Diabetic foot wound s/p R BKA on 07/15/17. Narrowed to ceftriaxone per primary. Would  consider changing to oral antibiotics  3. HTN / volume stable  Continue to monitor EDW on dialysis  4.Anemia. Hgb stable. Continue home iron supplementation.  Start Darbepoietin  5. Diabetes per primary 6. Bones  Continue renvela   Phos controlled       LOS: 3 Earlie Schank W @TODAY @10 :35 AM

## 2017-07-20 NOTE — Progress Notes (Signed)
Occupational Therapy Session Note  Patient Details  Name: Charles Vasquez MRN: [id number] Date of Birth: May 06, 1935  Today's Date: 07/20/2017 OT Individual Time: 1027-2536 and 1002-1057 OT Individual Time Calculation (min): 46 min and 55 min  Short Term Goals: Week 1:  OT Short Term Goal 1 (Week 1): Pt will be able to transfer on and off a toilet with mod A. OT Short Term Goal 2 (Week 1): Pt will be able to consistently sit to stand at sink with min A. OT Short Term Goal 3 (Week 1): Pt will be able to don shirt with mod A or less. OT Short Term Goal 4 (Week 1): Pt will be able to don pants over feet with min A. OT Short Term Goal 5 (Week 1): Pt will bathe with min A.  Skilled Therapeutic Interventions/Progress Updates:    Pt seen for makeup therapy time and amenable to tx. Greeted on bedpan post BM. He rolled Rt>Lt with steady assist for hygiene and applying clean brief. Pt donning pants while supine with instruction on bridging and completed 3/3 components himself with extra time! Afterwards OT retrieved drop arm commode for room to decrease level of assist for toilet transfers. Pt completed lateral scoot transfers to and from commode with Mod A, fading to Min A. He was able to lower clothing utilizing lateral leans however required assist for elevating pants afterwards (for toileting simulation). RN staff made aware of transfer status change and safety plan updated. He then completed scoot transfer to w/c with Min A. Reported increased feelings of nausea. With pt consent, provided him with orange/ginger essential oil blend via inhalation to decrease nausea. RN in to provide medication at end of session, aware of nausea c/o. Pt was left in her care with safety belt fastened, chair alarm set, and all needs.   2nd Session 1:1 tx (55 min) Pt greeted seated in w/c. Reported that nausea was much better. "Both of those things you girls gave me helped." Tx focus on functional transfers, sit<stands, Lt  NMR, and general strengthening during bathing and dressing tasks. Once residual limb and Lt hand IV were covered, lateral scoot<TTB completed with Min-Mod A using grab bars. Pt bathed while seated utilizing lateral leans for perihygiene with instruction and steady assist for balance. He dressed w/c level sit<stand at sink, incorporating L UE to thread pants. Due to increased unsteadiness without bilateral UE support, OT assisted with elevating LB garments over hips while pt focused on maintaining upright posture. After completing oral care/grooming tasks w/c level with supervision (for meeting visual demands), he opted to remain in w/c. Pt left in w/c with all needs within reach, chair alarm set, and safety belt fastened.    Therapy Documentation Precautions:  Precautions Precautions: Fall Restrictions Weight Bearing Restrictions: Yes RLE Weight Bearing: Non weight bearing Vital Signs: Therapy Vitals Temp: 98.2 F (36.8 C) Temp Source: Oral Pulse Rate: 74 Resp: 18 BP: (!) 159/57 Patient Position (if appropriate): Lying Oxygen Therapy SpO2: 100 % O2 Device: Room Air Pain: In Lt arm. "A whole lot better than what it was yesterday."    ADL: ADL ADL Comments: refer to functional navigator :    See Function Navigator for Current Functional Status.   Therapy/Group: Individual Therapy  Deari Vasquez A Charles Vasquez 07/20/2017, 8:50 AM

## 2017-07-20 NOTE — Progress Notes (Signed)
Hypoglycemic Event  CBG: 41  Treatment: 15 GM carbohydrate snack  Symptoms: none Follow-up CBG: Time:0705 CBG Result:41  Possible Reasons for Event: Inadequate meal intake  Comments/MD notified:yes    Aashish Hamm, [employer]

## 2017-07-20 NOTE — Progress Notes (Signed)
[place] PHYSICAL MEDICINE & REHABILITATION     PROGRESS NOTE  Subjective/Complaints:  Patient sitting in bed.  Nurse informed me that CBG was 41 this morning.  Patient was asleep and did not realize his sugar was that low.  Describes some mild phantom limb pain in his right leg last night.  Left shoulder better with heat and muscle rub  ROS: Patient denies fever, rash, sore throat, blurred vision, nausea, vomiting, diarrhea, cough, shortness of breath or chest pain, joint or back pain, headache, or mood change.    Objective: Vital Signs: Blood pressure (!) 159/57, pulse 74, temperature 98.2 F (36.8 C), temperature source Oral, resp. rate 18, weight 56.2 kg (123 lb 14.4 oz), SpO2 100 %. No results found. Recent Labs    07/17/17 1937  WBC 12.8*  HGB 8.7*  HCT 28.1*  PLT 201   Recent Labs    07/17/17 1937 07/18/17 2141  NA 133* 138  K 4.3 3.8  CL 96* 98*  GLUCOSE 111* 195*  BUN 24* 21*  CREATININE 6.85*  5.44* 5.11*  CALCIUM 6.9* 7.6*   CBG (last 3)  Recent Labs    07/20/17 0649 07/20/17 0707 07/20/17 0731  GLUCAP 41* 41* 79    Wt Readings from Last 3 Encounters:  07/18/17 56.2 kg (123 lb 14.4 oz)  07/17/17 55.4 kg (122 lb 2.2 oz)  07/11/17 59 kg (130 lb)    Physical Exam:  BP (!) 159/57 (BP Location: Left Arm)   Pulse 74   Temp 98.2 F (36.8 C) (Oral)   Resp 18   Wt 56.2 kg (123 lb 14.4 oz)   SpO2 100%   BMI 19.41 kg/m  Constitutional: No distress . Vital signs reviewed. HEENT: EOMI, oral membranes moist Neck: supple Cardiovascular: RRR without murmur. No JVD    Respiratory: CTA Bilaterally without wheezes or rales. Normal effort    GI: BS +, non-tender, non-distended  MSK: Right BKA edema and tenderness. Left shoulder ttp palpation and with basic ER/IR.  RUE: amputation of 2nd digit at PIP LLE: amputation digits  Neurological: He isalertand oriented x3 Motor: B/l UE, LLE, Right hip flexion 4+/5 proximal to distal Sensation absent Left LE  below knee Skin: BKA with staples c/d/i, minimal drainage  Assessment/Plan: 1. Functional deficits secondary to right BKA which require 3+ hours per day of interdisciplinary therapy in a comprehensive inpatient rehab setting. Physiatrist is providing close team supervision and 24 hour management of active medical problems listed below. Physiatrist and rehab team continue to assess barriers to discharge/monitor patient progress toward functional and medical goals.  Function:  Bathing Bathing position   Position: Sitting EOB  Bathing parts Body parts bathed by patient: Chest, Abdomen, Right arm, Left arm, Front perineal area, Right upper leg, Left upper leg Body parts bathed by helper: Buttocks, Left lower leg, Back  Bathing assist        Upper Body Dressing/Undressing Upper body dressing   What is the patient wearing?: Pull over shirt/dress     Pull over shirt/dress - Perfomed by patient: Thread/unthread right sleeve, Thread/unthread left sleeve, Put head through opening, Pull shirt over trunk Pull over shirt/dress - Perfomed by helper: Thread/unthread right sleeve, Thread/unthread left sleeve, Put head through opening, Pull shirt over trunk        Upper body assist Assist Level: Supervision or verbal cues      Lower Body Dressing/Undressing Lower body dressing   What is the patient wearing?: Pants, Non-skid slipper socks  Pants- Performed by patient: Thread/unthread right pants leg, Thread/unthread left pants leg Pants- Performed by helper: Pull pants up/down   Non-skid slipper socks- Performed by helper: Don/doff left sock                  Lower body assist Assist for lower body dressing: (Mod A)      Toileting Toileting Toileting activity did not occur: No continent bowel/bladder event Toileting steps completed by patient: Performs perineal hygiene Toileting steps completed by helper: Adjust clothing prior to toileting, Adjust clothing after  toileting Toileting Assistive Devices: Grab bar or rail  Toileting assist     Transfers Chair/bed transfer   Chair/bed transfer method: Squat pivot Chair/bed transfer assist level: Moderate assist (Pt 50 - 74%/lift or lower) Chair/bed transfer assistive device: Armrests     Locomotion Ambulation Ambulation activity did not occur: Safety/medical concerns         Wheelchair   Type: Manual Max wheelchair distance: 125 ft  Assist Level: Supervision or verbal cues  Cognition Comprehension Comprehension assist level: Understands basic 90% of the time/cues < 10% of the time  Expression Expression assist level: Expresses basic 90% of the time/requires cueing < 10% of the time.  Social Interaction Social Interaction assist level: Interacts appropriately 90% of the time - Needs monitoring or encouragement for participation or interaction.  Problem Solving Problem solving assist level: Solves basic 75 - 89% of the time/requires cueing 10 - 24% of the time  Memory Memory assist level: Recognizes or recalls 50 - 74% of the time/requires cueing 25 - 49% of the time    Medical Problem List and Plan: 1.Decreased functional mobilitysecondary to right BKA 07/15/2017  Continue therapies  shrinker    2. DVT Prophylaxis/Anticoagulation: Subcutaneous heparin. Monitor for any bleeding episodes 3. Pain Management:Ultram as needed  -may have left RTC tendonitis/bursitis   -muscle rub, kpad for left shoulder effective   -ROM with OT  -Mild right lower limb phantom pain   -Observe for now 4. Mood:Provide emotional support 5. Neuropsych: This patientis?fully capable of making decisions on hisown behalf. 6. Skin/Wound Care:Routine skin checks 7. Fluids/Electrolytes/Nutrition:Routine in and outs  8.Acute on chronic anemia. Continue Niferex  Hb 8.7 on 5/9  Cont to monitor 9.End-stage renal disease with hemodialysis. Follow-up renal services 10.Diabetes mellitus with peripheral  neuropathy. Hemoglobin A1c 7.1.       Unclear why sugar dropped this morning.  He did eat dinner last night.  Sugar still running higher in the afternoon and evening hours.  -Adjust evening Lantus to 6 units and add a.m. Lantus 4 units today. 11.ID. Blood cultures Proteus/strep.   Continue course of Rocephin 12.Hypertension. Currently holding losartan due to hypotension.   Fair control at present 13.Constipation. Laxative assistance 14. Leukocytosis  WBCs 12.8 on 5/9  Afrebile  Cont to monitor  LOS (Days) 3 A FACE TO FACE EVALUATION WAS PERFORMED  Ranelle Oyster 07/20/2017 7:44 AM

## 2017-07-21 ENCOUNTER — Inpatient Hospital Stay (HOSPITAL_COMMUNITY): Payer: Medicare Other | Admitting: Physical Therapy

## 2017-07-21 ENCOUNTER — Inpatient Hospital Stay (HOSPITAL_COMMUNITY): Payer: Medicare Other

## 2017-07-21 DIAGNOSIS — E162 Hypoglycemia, unspecified: Secondary | ICD-10-CM

## 2017-07-21 LAB — RENAL FUNCTION PANEL
ALBUMIN: 1.9 g/dL — AB (ref 3.5–5.0)
ANION GAP: 14 (ref 5–15)
BUN: 56 mg/dL — AB (ref 6–20)
CALCIUM: 7.5 mg/dL — AB (ref 8.9–10.3)
CO2: 24 mmol/L (ref 22–32)
Chloride: 93 mmol/L — ABNORMAL LOW (ref 101–111)
Creatinine, Ser: 8.92 mg/dL — ABNORMAL HIGH (ref 0.61–1.24)
GFR calc Af Amer: 6 mL/min — ABNORMAL LOW (ref >60)
GFR calc non Af Amer: 5 mL/min — ABNORMAL LOW (ref >60)
GLUCOSE: 146 mg/dL — AB (ref 65–99)
Phosphorus: 4.7 mg/dL — ABNORMAL HIGH (ref 2.5–4.6)
Potassium: 4.2 mmol/L (ref 3.5–5.1)
Sodium: 131 mmol/L — ABNORMAL LOW (ref 135–145)

## 2017-07-21 LAB — GLUCOSE, CAPILLARY
GLUCOSE-CAPILLARY: 95 mg/dL (ref 65–99)
GLUCOSE-CAPILLARY: 144 mg/dL — AB (ref 65–99)
GLUCOSE-CAPILLARY: 56 mg/dL — AB (ref 65–99)
GLUCOSE-CAPILLARY: 168 mg/dL — AB (ref 65–99)
Glucose-Capillary: 57 mg/dL — ABNORMAL LOW (ref 65–99)
Glucose-Capillary: 53 mg/dL — ABNORMAL LOW (ref 65–99)
Glucose-Capillary: 146 mg/dL — ABNORMAL HIGH (ref 65–99)

## 2017-07-21 LAB — CBC
HCT: 26.2 % — ABNORMAL LOW (ref 39.0–52.0)
Hemoglobin: 7.9 g/dL — ABNORMAL LOW (ref 13.0–17.0)
MCH: 26.4 pg (ref 26.0–34.0)
MCHC: 30.2 g/dL (ref 30.0–36.0)
MCV: 87.6 fL (ref 78.0–100.0)
PLATELETS: 259 10*3/uL (ref 150–400)
RBC: 2.99 MIL/uL — ABNORMAL LOW (ref 4.22–5.81)
RDW: 15.4 % (ref 11.5–15.5)
WBC: 10 10*3/uL (ref 4.0–10.5)

## 2017-07-21 MED ORDER — LOSARTAN POTASSIUM 25 MG PO TABS
25.0000 mg | ORAL_TABLET | Freq: Every day | ORAL | Status: DC
  Administered 2017-07-21 – 2017-07-23 (×3): 25 mg via ORAL
  Filled 2017-07-21 (×3): qty 1

## 2017-07-21 MED ORDER — DOXERCALCIFEROL 4 MCG/2ML IV SOLN
INTRAVENOUS | Status: AC
  Filled 2017-07-21: qty 2

## 2017-07-21 MED ORDER — GLUCOSE 40 % PO GEL
ORAL | Status: AC
Start: 2017-07-21 — End: 2017-07-21
  Filled 2017-07-21: qty 1

## 2017-07-21 MED ORDER — DOXERCALCIFEROL 4 MCG/2ML IV SOLN
2.0000 ug | INTRAVENOUS | Status: DC
  Administered 2017-07-21 – 2017-07-30 (×4): 2 ug via INTRAVENOUS
  Filled 2017-07-21 (×4): qty 2

## 2017-07-21 MED ORDER — DARBEPOETIN ALFA 200 MCG/0.4ML IJ SOSY
200.0000 ug | PREFILLED_SYRINGE | INTRAMUSCULAR | Status: DC
  Administered 2017-07-21 – 2017-07-29 (×2): 200 ug via INTRAVENOUS
  Filled 2017-07-21: qty 0.4

## 2017-07-21 MED ORDER — DARBEPOETIN ALFA 200 MCG/0.4ML IJ SOSY
PREFILLED_SYRINGE | INTRAMUSCULAR | Status: AC
  Filled 2017-07-21: qty 0.4

## 2017-07-21 MED ORDER — DEXTROSE 50 % IV SOLN
INTRAVENOUS | Status: AC
  Administered 2017-07-21: 50 mL
  Filled 2017-07-21: qty 50

## 2017-07-21 MED ORDER — TRAMADOL HCL 50 MG PO TABS
ORAL_TABLET | ORAL | Status: AC
  Filled 2017-07-21: qty 1

## 2017-07-21 NOTE — Progress Notes (Signed)
Occupational Therapy Session Note  Patient Details  Name: Charles Vasquez MRN: [id number] Date of Birth: 1935-10-25  Today's Date: 07/21/2017 OT Individual Time: 1000-1030 OT Individual Time Calculation (min): 30 min    Short Term Goals: Week 1:  OT Short Term Goal 1 (Week 1): Pt will be able to transfer on and off a toilet with mod A. OT Short Term Goal 2 (Week 1): Pt will be able to consistently sit to stand at sink with min A. OT Short Term Goal 3 (Week 1): Pt will be able to don shirt with mod A or less. OT Short Term Goal 4 (Week 1): Pt will be able to don pants over feet with min A. OT Short Term Goal 5 (Week 1): Pt will bathe with min A.  Skilled Therapeutic Interventions/Progress Updates:    Pt received in w/c agreeable to therapy with c/o pain as described below. Pt propelled w/c 75 ft with vc provided for encouragement and obstacle navigation and 1 rest break d/t fatigue. Demo provided and manual facilitation performed for proper B UE form during propulsion, esp when crossing doorways or uneven surfaces. Pt completed lateral scoot to couch with min A and poor safety awareness. Vc/demo provided re technique and safety to reduce fall risk. Pt performed lateral scoot back to w/c with mod A and heavy tactile/vc. Pt returned to room and OT wrapped residual limb. Pt left with QRB donned, chair alarm set and all needs met.   Therapy Documentation Precautions:  Precautions Precautions: Fall Restrictions Weight Bearing Restrictions: Yes RLE Weight Bearing: Non weight bearing  Pain: Pain Assessment Pain Scale: 0-10 Pain Score: 4  Pain Type: Acute pain Pain Location: Arm Pain Orientation: Left Pain Descriptors / Indicators: Stabbing Pain Onset: On-going Pain Intervention(s): Therapeutic touch ADL: ADL ADL Comments: refer to functional navigator  See Function Navigator for Current Functional Status.   Therapy/Group: Individual Therapy  Curtis Sites 07/21/2017, 10:31 AM

## 2017-07-21 NOTE — Progress Notes (Signed)
Patient received Dextrose 50% and glucose rechecked and was 144. MD notified.

## 2017-07-21 NOTE — Progress Notes (Signed)
Physical Therapy Session Note  Patient Details  Name: Charles Vasquez MRN: [id number] Date of Birth: 1935/09/06  Today's Date: 07/21/2017 PT Individual Time: 5830-9407 and 1100-1200 PT Individual Time Calculation (min): 55 min and 60 min (total 115 min)   Short Term Goals: Week 1:  PT Short Term Goal 1 (Week 1): Pt will complete squat pivot transfer with modA consistently PT Short Term Goal 2 (Week 1): Pt will propel manual w/c x 100 ft with SBA PT Short Term Goal 3 (Week 1): Pt will perform sit to stand with modA consistently  Skilled Therapeutic Interventions/Progress Updates: Tx 1: Pt received seated in bed, denies pain, reports blood sugar low overnight but feels ok currently. NT verifies blood sugar low but has been provided with intervention to address. Supine>sit with S, HOB elevated and bedrails. Squat pivot transfer minA to L into w/c; assist for boosting hips and limited use of LUE during transfer. Seated in w/c at sink pt performs hygiene with setupA, increased time. W/c propulsion x75' with BUE and S; limited by L shoulder pain. Sit <>stand in parallel bars x3 trials with modA faded to min guard by final trial, some reliance of back of LLE on chair for assistance to boost to standing, requires cues for anterior weight shift over L foot in standing, hip extension. Standing tolerance of approx 1 min per trial until fatigued. Squat pivot to mat towards R side with min guard, min cues for technique, hand placement. Sit >supine with S. Supine ROM to B hip flexors x2 min; educated pt in purpose of lying flat for stretching as therapist suspects pt will not tolerate prone d/t shoulder pain. Bridging on bolster 2x10 reps with mod cues for technique. LUE PROM shoulder abduction for pec major with light soft tissue massage and mild distraction, grade I mobilizations at glenohumeral joint for pain relief. Shoulder abduction ROM limited to 80 degrees before painful. Shoulder ER limited to neutral with  guarding noted. Returned to siting with S. Squat pivot to L into w/c with min guard. Remained seated in w/c at end of session, quick release belt and chair alarm intact, k-pad to L shoulder, all needs in reach.   Tx 2: Pt received seated in w/c, denies pain and agreeable to treatment. Transported to gym totalA for energy conservation as pt reports being very tired from several morning therapy sessions d/t dialysis. RLE leg rest adjusted. RLE residual limb dressing removed d/t ace wrap falling off and insufficient pressure gradient to facilitate edema reduction. RN arrived to re-dress. Squat pivot transfer minA to/from mat table. Supine R hip flexor stretch off edge of mat x2 min with overpressure to facilitate stretch. Semi-reclined situps 2x15 reps. Supine<>prone minA however difficulty tolerating position >5 min d/t shoulder pain. Returned to w/c as above. Transported to room totalA; remained seated in w/c with quick release belt and chair alarm intact, all needs in reach.     Therapy Documentation Precautions:  Precautions Precautions: Fall Restrictions Weight Bearing Restrictions: Yes RLE Weight Bearing: Non weight bearing      See Function Navigator for Current Functional Status.   Therapy/Group: Individual Therapy  Vista Lawman 07/21/2017, 8:59 AM

## 2017-07-21 NOTE — Progress Notes (Signed)
[place] PHYSICAL MEDICINE & REHABILITATION     PROGRESS NOTE  Subjective/Complaints:  Pt seen sitting up in bed, watching TV and eating breakfast.  He states he slept well overnight and had a good weekend.  Informed by nursing regarding hypoglycemia this AM.  No stump shrinker in place.   ROS: Denies CP, SOB, N/V/D  Objective: Vital Signs: Blood pressure (!) 158/78, pulse 72, temperature (!) 97.5 F (36.4 C), temperature source Oral, resp. rate 16, weight 56.2 kg (123 lb 14.4 oz), SpO2 100 %. No results found. No results for input(s): WBC, HGB, HCT, PLT in the last 72 hours. Recent Labs    07/18/17 2141  NA 138  K 3.8  CL 98*  GLUCOSE 195*  BUN 21*  CREATININE 5.11*  CALCIUM 7.6*   CBG (last 3)  Recent Labs    07/21/17 0648 07/21/17 0710 07/21/17 0727  GLUCAP 56* 53* 144*    Wt Readings from Last 3 Encounters:  07/18/17 56.2 kg (123 lb 14.4 oz)  07/17/17 55.4 kg (122 lb 2.2 oz)  07/11/17 59 kg (130 lb)    Physical Exam:  BP (!) 158/78   Pulse 72   Temp (!) 97.5 F (36.4 C) (Oral)   Resp 16   Wt 56.2 kg (123 lb 14.4 oz)   SpO2 100%   BMI 19.41 kg/m  Constitutional: No distress . Vital signs reviewed. HENT: Normocephalic.  Atraumatic. Eyes: EOMI. No discharge. Cardiovascular: RRR. No JVD. Respiratory: CTA Bilaterally. Normal effort. GI: BS +. Non-distended. MSK: Right BKA edema and tenderness.  RUE: amputation of 2nd digit at PIP LLE: amputation digits  Neurological: He isalertand oriented Motor: B/l UE, LLE, Right hip flexion 4+/5 proximal to distal Skin: BKA with dressing c/d/i  Assessment/Plan: 1. Functional deficits secondary to right BKA which require 3+ hours per day of interdisciplinary therapy in a comprehensive inpatient rehab setting. Physiatrist is providing close team supervision and 24 hour management of active medical problems listed below. Physiatrist and rehab team continue to assess barriers to discharge/monitor patient progress  toward functional and medical goals.  Function:  Bathing Bathing position   Position: Shower  Bathing parts Body parts bathed by patient: Chest, Abdomen, Right arm, Left arm, Front perineal area, Right upper leg, Left upper leg, Buttocks, Left lower leg Body parts bathed by helper: Back  Bathing assist Assist Level: Touching or steadying assistance(Pt > 75%)      Upper Body Dressing/Undressing Upper body dressing   What is the patient wearing?: Pull over shirt/dress     Pull over shirt/dress - Perfomed by patient: Thread/unthread right sleeve, Thread/unthread left sleeve, Put head through opening, Pull shirt over trunk Pull over shirt/dress - Perfomed by helper: Thread/unthread right sleeve, Thread/unthread left sleeve, Put head through opening, Pull shirt over trunk        Upper body assist Assist Level: Supervision or verbal cues      Lower Body Dressing/Undressing Lower body dressing   What is the patient wearing?: Pants, Non-skid slipper socks     Pants- Performed by patient: Thread/unthread right pants leg, Thread/unthread left pants leg Pants- Performed by helper: Pull pants up/down   Non-skid slipper socks- Performed by helper: Don/doff left sock                  Lower body assist Assist for lower body dressing: (Mod A)      Toileting Toileting Toileting activity did not occur: No continent bowel/bladder event Toileting steps completed by patient: Performs perineal  hygiene Toileting steps completed by helper: Performs perineal hygiene, Adjust clothing after toileting Toileting Assistive Devices: Grab bar or rail  Toileting assist     Transfers Chair/bed transfer   Chair/bed transfer method: Squat pivot Chair/bed transfer assist level: Touching or steadying assistance (Pt > 75%) Chair/bed transfer assistive device: Armrests     Locomotion Ambulation Ambulation activity did not occur: Safety/medical concerns         Wheelchair   Type: Manual Max  wheelchair distance: 75 Assist Level: Supervision or verbal cues  Cognition Comprehension Comprehension assist level: Understands basic 90% of the time/cues < 10% of the time  Expression Expression assist level: Expresses basic 90% of the time/requires cueing < 10% of the time.  Social Interaction Social Interaction assist level: Interacts appropriately 90% of the time - Needs monitoring or encouragement for participation or interaction.  Problem Solving Problem solving assist level: Solves basic 75 - 89% of the time/requires cueing 10 - 24% of the time  Memory Memory assist level: Recognizes or recalls 50 - 74% of the time/requires cueing 25 - 49% of the time    Medical Problem List and Plan: 1.Decreased functional mobilitysecondary to right BKA 07/15/2017  Continue therapies  Shrinker ordered, ?pending 2. DVT Prophylaxis/Anticoagulation: Subcutaneous heparin. Monitor for any bleeding episodes 3. Pain Management:Ultram as needed  -may have left RTC tendonitis/bursitis   -muscle rub, kpad for left shoulder effective   -ROM with OT  -Mild right lower limb phantom pain   -Observe for now 4. Mood:Provide emotional support 5. Neuropsych: This patientis?fully capable of making decisions on hisown behalf. 6. Skin/Wound Care:Routine skin checks 7. Fluids/Electrolytes/Nutrition:Routine in and outs  8.Acute on chronic anemia. Continue Niferex  Hb 8.7 on 5/9  Cont to monitor 9.End-stage renal disease with hemodialysis. Follow-up renal services 10.Diabetes mellitus with peripheral neuropathy. Hemoglobin A1c 7.1.   Lantus 6 units with HD  Lantus 4 units daily d/ced on 5/14  Extremely labile with hypoglycemia 11.ID. Blood cultures Proteus/strep.   Continue course of Rocephin through 5/16 12.Hypertension.   Losartan 25 started on 5/13 13.Constipation. Laxative assistance 14. Leukocytosis  WBCs 12.8 on 5/9  Afrebile  Cont to monitor  LOS (Days) 4 A FACE TO  FACE EVALUATION WAS PERFORMED  Ankit Karis Juba 07/21/2017 9:20 AM

## 2017-07-21 NOTE — Progress Notes (Addendum)
Hypoglycemic Event  CBG: 57  Treatment: 1 tube instant glucose  Symptoms: None  Follow-up CBG: Time:0710  Result:53  Possible Reasons for Event: unknown  Patient received dextrose 50% and was at 144.  Comments/MD notified:0739    Kensie Susman

## 2017-07-21 NOTE — Progress Notes (Signed)
Physical Therapy Note  Patient Details  Name: Charles Vasquez MRN: [id number] Date of Birth: [birth year] Today's Date: 07/21/2017    Time: [phone] 30 minutes  1:1 Pt c/o Lt UE pain, requests seated therex so he can have hot pack applied to Lt shoulder.  Pt performs seated bilat LE and core strengthening therex as well as education on stretching and positioning for residual limb. Pt left in chair with needs at hand, quick release belt donned.   [name] 07/21/2017, 10:38 AM

## 2017-07-21 NOTE — Discharge Instructions (Signed)
Inpatient Rehab Discharge Instructions  Charles Vasquez Discharge date and time: No discharge date for patient encounter.   Activities/Precautions/ Functional Status: Activity: activity as tolerated Diet: diabetic diet Wound Care: keep wound clean and dry Functional status:  ___ No restrictions     ___ Walk up steps independently ___ 24/7 supervision/assistance   ___ Walk up steps with assistance ___ Intermittent supervision/assistance  ___ Bathe/dress independently ___ Walk with walker     _x__ Bathe/dress with assistance ___ Walk Independently    ___ Shower independently ___ Walk with assistance    ___ Shower with assistance ___ No alcohol     ___ Return to work/school ________  Special Instructions: Continue hemodialysis as directed   My questions have been answered and I understand these instructions. I will adhere to these goals and the provided educational materials after my discharge from the hospital.  Patient/Caregiver Signature _______________________________ Date __________  Clinician Signature _______________________________________ Date __________  Please bring this form and your medication list with you to all your follow-up doctor's appointments.

## 2017-07-21 NOTE — Progress Notes (Addendum)
[facility] Progress Note   Subjective:  No c/o  Objective Vitals:   07/20/17 2047 07/20/17 2100 07/21/17 0030 07/21/17 0230  BP: (!) 190/83 (!) 168/84 (!) 160/74 (!) 158/78  Pulse: 74 82 74 72  Resp: 16 16    Temp: (!) 97.5 F (36.4 C)     TempSrc: Oral     SpO2: 100%     Weight:       Physical Exam Alert no distress No jvd Chest cta bilat RRR no mrg abd soft ntnd scaphoid Ext 1+edema L leg, sp R bka NF, ox 3  Additional Objective Labs: Basic Metabolic Panel: Recent Labs  Lab 07/17/17 0409 07/17/17 1937 07/18/17 2141  NA 134* 133* 138  K 4.1 4.3 3.8  CL 97* 96* 98*  CO2 28 27 26   GLUCOSE 120* 111* 195*  BUN 24* 24* 21*  CREATININE 5.35* 6.85*  5.44* 5.11*  CALCIUM 7.0* 6.9* 7.6*  PHOS  --  3.9 4.5   Liver Function Tests: Recent Labs  Lab 07/17/17 1937 07/18/17 2141  ALBUMIN 1.9* 2.0*   No results for input(s): LIPASE, AMYLASE in the last 168 hours. CBC: Recent Labs  Lab 07/15/17 0418  07/16/17 0351 07/17/17 0409 07/17/17 1937  WBC 16.4*  --  12.6* 11.2* 12.8*  NEUTROABS  --   --   --  9.0*  --   HGB 9.0*   < > 9.4* 8.9* 8.7*  HCT 30.1*   < > 29.8* 28.4* 28.1*  MCV 86.7  --  85.9 87.4 87.3  PLT 236  --  184 202 201   < > = values in this interval not displayed.   Blood Culture    Component Value Date/Time   SDES BLOOD LEFT HAND 07/12/2017 0758   SPECREQUEST  07/12/2017 0758    BOTTLES DRAWN AEROBIC ONLY Blood Culture results may not be optimal due to an inadequate volume of blood received in culture bottles   CULT  07/12/2017 0758    NO GROWTH 5 DAYS Performed at [facility], [address] [place], [place] [address]    REPTSTATUS 07/17/2017 FINAL 07/12/2017 0758    Cardiac Enzymes: No results for input(s): CKTOTAL, CKMB, CKMBINDEX, TROPONINI in the last 168 hours. CBG: Recent Labs  Lab 07/21/17 0623 07/21/17 0648 07/21/17 0710 07/21/17 0727 07/21/17 1200  GLUCAP 57* 56* 53* 144* 168*   Iron Studies:   Recent Labs    07/20/17 1106  IRON 56  TIBC 115*   @lablastinr3 @ Studies/Results: No results found. Medications: . cefTRIAXone (ROCEPHIN)  IV Stopped (07/20/17 1741)   HD orders: [name] MWF 3 hr 15 min 2.0 K/2.5 Ca  350/600  -Heparin pump 2000 unit IV bolus 500 unit hourly -Epogen 7200 unit IV TIW -Hectorol 2 mcg IV TIW       Impression: 1  Diabetic foot wound S/P R BKA 07/15/17 per [name] 2  Proteus/ strep bacteremia - on rocephin IV through 5/16 3  ESRD -MWF [name] 4  Anemia - HGB 8.7. Was to start Aranesp today-increased dose to 200 mcg IV q week. Cont oral Fe.  5  Secondary hyperparathyroidism - Last Ca 7.6 C Ca 9.2 Phos 4.5. Continue binders.  6  HTN/volume - Appears hypertensive, resumed Losartan.  7  Nutrition - Albumin 2.0. Renal/Carb mod diet. Add prostat, renal vit.  8  DM-per primary  Plan - HD today   Dene Gentry. Brown NP-C 07/21/2017, 12:16 PM  [facility] [employer] [phone]  Pt seen, examined and agree w A/P as above.  Charles Moselle MD [facility] [employer] pager [phone]   07/21/2017, 1:35 PM

## 2017-07-22 ENCOUNTER — Inpatient Hospital Stay (HOSPITAL_COMMUNITY): Payer: Medicare Other

## 2017-07-22 ENCOUNTER — Inpatient Hospital Stay (HOSPITAL_COMMUNITY): Payer: Medicare Other | Admitting: Occupational Therapy

## 2017-07-22 ENCOUNTER — Inpatient Hospital Stay (HOSPITAL_COMMUNITY): Payer: Medicare Other | Admitting: Physical Therapy

## 2017-07-22 LAB — GLUCOSE, CAPILLARY
GLUCOSE-CAPILLARY: 161 mg/dL — AB (ref 65–99)
Glucose-Capillary: 92 mg/dL (ref 65–99)
Glucose-Capillary: 117 mg/dL — ABNORMAL HIGH (ref 65–99)
Glucose-Capillary: 166 mg/dL — ABNORMAL HIGH (ref 65–99)

## 2017-07-22 NOTE — Progress Notes (Signed)
Social Work Patient ID: Charles Vasquez, male   DOB: July 04, 1935, 82 y.o.   MRN: [id number]  Met with sister to discuss her concerns with brother and encouraged her to come in for therapies tomorrow to see how he is doing in therapies. She reports his friend is 4 yo and can not provide assist to him at discharge. She is concerned due to he will not have 24 hr care at discharge. She voiced he is stubborn and tends to do what he wants to do. She will be here today @ 1:00 pm for PT session.

## 2017-07-22 NOTE — Progress Notes (Signed)
[place] [facility]     PROGRESS NOTE  Subjective/Complaints:  Pt seen lying in bed this AM.  He states he slept well overnight.    ROS: Denies CP, SOB, N/V/D  Objective: Vital Signs: Blood pressure (!) 158/72, pulse 71, temperature 98.4 F (36.9 C), temperature source Oral, resp. rate 18, weight 57.6 kg (126 lb 15.8 oz), SpO2 99 %. No results found. Recent Labs    07/21/17 1449  WBC 10.0  HGB 7.9*  HCT 26.2*  PLT 259   Recent Labs    07/21/17 1449  NA 131*  K 4.2  CL 93*  GLUCOSE 146*  BUN 56*  CREATININE 8.92*  CALCIUM 7.5*   CBG (last 3)  Recent Labs    07/21/17 1200 07/21/17 2122 07/22/17 0613  GLUCAP 168* 146* 92    Wt Readings from Last 3 Encounters:  07/22/17 57.6 kg (126 lb 15.8 oz)  07/17/17 55.4 kg (122 lb 2.2 oz)  07/11/17 59 kg (130 lb)    Physical Exam:  BP (!) 158/72 (BP Location: Left Arm)   Pulse 71   Temp 98.4 F (36.9 C) (Oral)   Resp 18   Wt 57.6 kg (126 lb 15.8 oz)   SpO2 99%   BMI 19.89 kg/m  Constitutional: No distress . Vital signs reviewed. HENT: Normocephalic.  Atraumatic. Eyes: EOMI. No discharge. Cardiovascular: RRR. No JVD. Respiratory: CTA Bilaterally. Normal effort. GI: BS +. Non-distended. MSK: Right BKA edema and tenderness.  RUE: amputation of 2nd digit at PIP LLE: amputation digits  Neurological: He isalertand oriented x2, confused. Motor: B/l UE, LLE, Right hip flexion 4+/5 proximal to distal Skin: BKA with dressing c/d/i  Assessment/Plan: 1. Functional deficits secondary to right BKA which require 3+ hours per day of interdisciplinary therapy in a comprehensive inpatient rehab setting. Physiatrist is providing close team supervision and 24 hour management of active medical problems listed below. Physiatrist and rehab team continue to assess barriers to discharge/monitor patient progress toward functional and medical goals.  Function:  Bathing Bathing position   Position:  Shower  Bathing parts Body parts bathed by patient: Chest, Abdomen, Right arm, Left arm, Front perineal area, Right upper leg, Left upper leg, Buttocks, Left lower leg Body parts bathed by helper: Back  Bathing assist Assist Level: Touching or steadying assistance(Pt > 75%)      Upper Body Dressing/Undressing Upper body dressing   What is the patient wearing?: Pull over shirt/dress     Pull over shirt/dress - Perfomed by patient: Thread/unthread right sleeve, Thread/unthread left sleeve, Put head through opening, Pull shirt over trunk Pull over shirt/dress - Perfomed by helper: Thread/unthread right sleeve, Thread/unthread left sleeve, Put head through opening, Pull shirt over trunk        Upper body assist Assist Level: Supervision or verbal cues      Lower Body Dressing/Undressing Lower body dressing   What is the patient wearing?: Pants     Pants- Performed by patient: Thread/unthread left pants leg, Thread/unthread right pants leg Pants- Performed by helper: Pull pants up/down   Non-skid slipper socks- Performed by helper: Don/doff left sock                  Lower body assist Assist for lower body dressing: Touching or steadying assistance (Pt > 75%)      Toileting Toileting   Toileting steps completed by patient: Performs perineal hygiene Toileting steps completed by helper: Adjust clothing prior to toileting, Adjust clothing after toileting  Toileting Assistive Devices: Estate agent Assist level: Touching or steadying assistance (Pt.75%)   Transfers Chair/bed transfer   Chair/bed transfer method: Lateral scoot Chair/bed transfer assist level: Touching or steadying assistance (Pt > 75%) Chair/bed transfer assistive device: Armrests     Locomotion Ambulation Ambulation activity did not occur: Safety/medical concerns         Wheelchair   Type: Manual Max wheelchair distance: 67' Assist Level: Supervision or verbal cues   Cognition Comprehension Comprehension assist level: Understands basic 90% of the time/cues < 10% of the time  Expression Expression assist level: Expresses basic 90% of the time/requires cueing < 10% of the time.  Social Interaction Social Interaction assist level: Interacts appropriately 90% of the time - Needs monitoring or encouragement for participation or interaction.  Problem Solving Problem solving assist level: Solves basic 75 - 89% of the time/requires cueing 10 - 24% of the time  Memory Memory assist level: Recognizes or recalls 50 - 74% of the time/requires cueing 25 - 49% of the time    Medical Problem List and Plan: 1.Decreased functional mobilitysecondary to right BKA 07/15/2017  Continue therapies  Shrinker ordered 2. DVT Prophylaxis/Anticoagulation: Subcutaneous heparin. Monitor for any bleeding episodes 3. Pain Management:Ultram as needed  -may have left RTC tendonitis/bursitis   -muscle rub, kpad for left shoulder effective   -ROM with OT  -Mild right lower limb phantom pain   -Observe for now 4. Mood:Provide emotional support 5. Neuropsych: This patientis?fully capable of making decisions on hisown behalf. 6. Skin/Wound Care:Routine skin checks 7. Fluids/Electrolytes/Nutrition:Routine in and outs  8.Acute on chronic anemia. Continue Niferex  Hb 7.9 on 5/13  Cont to monitor 9.End-stage renal disease with hemodialysis. Follow-up renal services 10.Diabetes mellitus with peripheral neuropathy. Hemoglobin A1c 7.1.   Lantus 6 units with HD  Lantus 4 units daily d/ced on 5/14  Remains labile, monitor for trend 11.ID. Blood cultures Proteus/strep.   Continue course of Rocephin through 5/16 12.Hypertension.   Losartan 25 started on 5/13  Remains elevated, consider further increase tomorrow if necessary 13.Constipation. Laxative assistance 14. Leukocytosis: Resolved  WBCs 10.0 on 5/13  Afrebile  Cont to monitor  LOS (Days) 5 A FACE TO  FACE EVALUATION WAS PERFORMED  Zhi Geier Karis Juba 07/22/2017 9:27 AM

## 2017-07-22 NOTE — Progress Notes (Signed)
Occupational Therapy Session Note  Patient Details  Name: Charles Vasquez MRN: [id number] Date of Birth: 15-Aug-1935  Today's Date: 07/22/2017 OT Individual Time: 0900-0930 OT Individual Time Calculation (min): 30 min    Short Term Goals: Week 1:  OT Short Term Goal 1 (Week 1): Pt will be able to transfer on and off a toilet with mod A. OT Short Term Goal 2 (Week 1): Pt will be able to consistently sit to stand at sink with min A. OT Short Term Goal 3 (Week 1): Pt will be able to don shirt with mod A or less. OT Short Term Goal 4 (Week 1): Pt will be able to don pants over feet with min A. OT Short Term Goal 5 (Week 1): Pt will bathe with min A. Week 2:     Skilled Therapeutic Interventions/Progress Updates:    1:1 Focus on transfer training including squat pivots/ scoots and stand pivots. Trial of sit to stands with RW with platform to help decr pain in left shoulder. Pt tender in bicep tendon insertion. Kinseotape rotator cuff for support and pain. Pt required mod A to stand and mod to max A to maintain balance in standing and max A to pivot over to the mat.      Therapy Documentation Precautions:  Precautions Precautions: Fall Restrictions Weight Bearing Restrictions: Yes RLE Weight Bearing: Non weight bearing Pain: Pain Assessment Pain Scale: Faces Faces Pain Scale: Hurts a little bit Pain Type: Surgical pain Pain Location: Leg Pain Orientation: Right Pain Descriptors / Indicators: Discomfort Pain Onset: Gradual Pain Intervention(s): Emotional support;Repositioned ADL: ADL ADL Comments: refer to functional navigator  See Function Navigator for Current Functional Status.   Therapy/Group: Individual Therapy  Roney Mans [facility] 07/22/2017, 3:27 PM

## 2017-07-22 NOTE — Progress Notes (Signed)
Physical Therapy Session Note  Patient Details  Name: Charles Vasquez MRN: [id number] Date of Birth: [birth year]  Today's Date: 07/22/2017 PT Individual Time: 0805-0900 and 1300-1400 PT Individual Time Calculation (min): 55 min and 60 min (total 115 min)   Short Term Goals: Week 1:  PT Short Term Goal 1 (Week 1): Pt will complete squat pivot transfer with modA consistently PT Short Term Goal 2 (Week 1): Pt will propel manual w/c x 100 ft with SBA PT Short Term Goal 3 (Week 1): Pt will perform sit to stand with modA consistently  Skilled Therapeutic Interventions/Progress Updates: Tx 1: Pt received seated in bed eating breakfast; requests a few minutes to finish eating prior to participation. Upon return pt agreeable to treatment. Supine>sit with S. Transfer bed<>drop arm BSC with modA to boost hips over large gap. Performs lateral leans to doff/don pants and brief, performs hygiene with setupA. Unable to pull pants over hips after toileting; transferred back to bed and performed totalA with lateral leans in bed. W/c propulsion x100' with BUE for strengthening and endurance. Ongoing c/o L shoulder pain. Therapist performed gentle PROM shoulder abduction, flexion, internal/external rotation and soft tissue massage to pec major. Note moderate swelling at superior aspect of shoulder, very tender to palpation as is biceps tendon. Remained seated in w/c with chair alarm and quick release belt intact, k-pad applied to L shoulder, all needs in reach.   Tx 2: Pt received seated in w/c, c/o ongoing L shoulder pain rated 6/10 and agreeable to treatment. Transported to gym totalA for energy conservation. Squat pivot transfer to mat min guard. Sit <>stand with platform RW x2 trials; adjusted height however pt self-selects to use LUE on regular handle as he reports it "feels more right". Platform removed and pt performed 2x5 reps sit <>stand from mat table with RW and modA overall; heavy reliance of back of LLE on  table and cues for hip/trunk extension into full standing. RN alerted to scab falling off residual R second finger; RN applied bandaid and states she will report to pt's primary RN regarding color/swelling. PROM to LUE attempted glenohumeral isolated joint mobility however difficulty stabilizing scapula d/t tenderness/pain. End range PROM to shoulder abduction, internal/external rotation 2x1 min each. Pt consented to e-stim to L shoulder after explaining benefits/risks, performed x10 min TENS for pain management over superior aspect of shoulder/AC joint. Pt's sister and brother arrived at end of session, reviewed current functional status and limitations d/t strength/endurance and L shoulder pain. Remained seated in w/c with quick release belt and chair alarm intact and all needs in reach.       Therapy Documentation Precautions:  Precautions Precautions: Fall Restrictions Weight Bearing Restrictions: Yes RLE Weight Bearing: Non weight bearing  See Function Navigator for Current Functional Status.   Therapy/Group: Individual Therapy  Vista Lawman 07/22/2017, 9:19 AM

## 2017-07-22 NOTE — Progress Notes (Signed)
  [facility] Progress Note   Subjective:  No c/o  Objective Vitals:   07/21/17 1800 07/21/17 1823 07/21/17 2123 07/22/17 0515  BP: (!) 159/77 (!) 150/78 (!) 149/67 (!) 158/72  Pulse: 87 80 86 71  Resp:  18 18 18   Temp:  98 F (36.7 C) 98.7 F (37.1 C) 98.4 F (36.9 C)  TempSrc:  Oral Oral Oral  SpO2:  100% 96% 99%  Weight:  57.6 kg (126 lb 15.8 oz)  57.6 kg (126 lb 15.8 oz)   Physical Exam Alert no distress No jvd Chest cta bilat RRR no mrg abd soft ntnd scaphoid Ext 1+edema L leg, sp R bka NF, ox 3  Additional Objective Labs: Basic Metabolic Panel: Recent Labs  Lab 07/17/17 1937 07/18/17 2141 07/21/17 1449  NA 133* 138 131*  K 4.3 3.8 4.2  CL 96* 98* 93*  CO2 27 26 24   GLUCOSE 111* 195* 146*  BUN 24* 21* 56*  CREATININE 6.85*  5.44* 5.11* 8.92*  CALCIUM 6.9* 7.6* 7.5*  PHOS 3.9 4.5 4.7*   Liver Function Tests: Recent Labs  Lab 07/17/17 1937 07/18/17 2141 07/21/17 1449  ALBUMIN 1.9* 2.0* 1.9*   No results for input(s): LIPASE, AMYLASE in the last 168 hours. CBC: Recent Labs  Lab 07/16/17 0351 07/17/17 0409 07/17/17 1937 07/21/17 1449  WBC 12.6* 11.2* 12.8* 10.0  NEUTROABS  --  9.0*  --   --   HGB 9.4* 8.9* 8.7* 7.9*  HCT 29.8* 28.4* 28.1* 26.2*  MCV 85.9 87.4 87.3 87.6  PLT 184 202 201 259   Blood Culture    Component Value Date/Time   SDES BLOOD LEFT HAND 07/12/2017 0758   SPECREQUEST  07/12/2017 0758    BOTTLES DRAWN AEROBIC ONLY Blood Culture results may not be optimal due to an inadequate volume of blood received in culture bottles   CULT  07/12/2017 0758    NO GROWTH 5 DAYS Performed at [facility], [address], [place], [place] [address]    REPTSTATUS 07/17/2017 FINAL 07/12/2017 0758    Cardiac Enzymes: No results for input(s): CKTOTAL, CKMB, CKMBINDEX, TROPONINI in the last 168 hours. CBG: Recent Labs  Lab 07/21/17 0710 07/21/17 0727 07/21/17 1200 07/21/17 2122 07/22/17 0613  GLUCAP 53* 144*  168* 146* 92   Iron Studies:  Recent Labs    07/20/17 1106  IRON 56  TIBC 115*   @lablastinr3 @ Studies/Results: No results found. Medications: . cefTRIAXone (ROCEPHIN)  IV Stopped (07/21/17 2331)   HD orders: [name] MWF 3hr 15 min   2/2.5 bath  350/600  Hep 2000 then 500 u/hr -Epogen 7200 unit IV TIW -Hectorol 2 mcg IV TIW       Impression: 1  Diabetic foot wound S/P R BKA 07/15/17 per [name] 2  Proteus/ strep bacteremia - on rocephin IV through 5/16 3  ESRD -MWF [name] 4  Anemia - HGB 8.7. Getting darbe 200 mcg IV q week, last dose 5/13 here. Cont oral Fe.  5  Secondary hyperparathyroidism - Last Ca 7.6 C Ca 9.2 Phos 4.5. Continue binders.  6  HTN/volume - resumed Losartan. no vol excess on exam 7  Nutrition - Albumin 2.0. Renal/Carb mod diet. Add prostat, renal vit.  8  DM-per primary  Plan - HD Wed   Vinson Moselle MD [facility] pager [phone]   07/22/2017, 10:40 AM

## 2017-07-22 NOTE — Progress Notes (Signed)
Social Work Patient ID: Charles Vasquez, male   DOB: 05/06/1935, 82 y.o.   MRN: [id number]  Met with sister and pt she did not make it to his PT session. He is really complaining about his left shoulder, he is having much pain and can not really move it. Dan-PA aware and plans to do an x-ray and MD aware also. He has a heating pad on it now. Will await x-ray. Both report him falling multiple times at home before coming into the hospital.

## 2017-07-22 NOTE — Progress Notes (Signed)
Occupational Therapy Session Note  Patient Details  Name: Charles Vasquez MRN: [id number] Date of Birth: 12-13-1935  Today's Date: 07/22/2017 OT Individual Time: 1000-1100 OT Individual Time Calculation (min): 60 min    Short Term Goals: Week 1:  OT Short Term Goal 1 (Week 1): Pt will be able to transfer on and off a toilet with mod A. OT Short Term Goal 2 (Week 1): Pt will be able to consistently sit to stand at sink with min A. OT Short Term Goal 3 (Week 1): Pt will be able to don shirt with mod A or less. OT Short Term Goal 4 (Week 1): Pt will be able to don pants over feet with min A. OT Short Term Goal 5 (Week 1): Pt will bathe with min A.  Skilled Therapeutic Interventions/Progress Updates:    ADL session using the shower for bathing.  Mod assist for squat pivot transfer to the tub bench from the wheelchair.  He completed bathing in sitting with min assist and lateral leans to wash part of peri area.  Had him use the grab bars for sit to stand and standing as well in order for therapist to assist with cleaning peri area thoroughly.  Transferred out to the wheelchair for dressing at the sink with mod assist.  He needed max assist for all LB bathing sit to stand and supervision for donning pullover shirt.  Decreased ability to thread the correct LE through his pants legs.  Also he could not reach the LLE for donning sock or shoe.  Finished session with pt in the wheelchair with call button and phone in reach.  Safety belt and chair alarm in place.    Therapy Documentation Precautions:  Precautions Precautions: Fall Restrictions Weight Bearing Restrictions: Yes RLE Weight Bearing: Non weight bearing  Pain: Pain Assessment Pain Scale: Faces Faces Pain Scale: Hurts a little bit Pain Type: Surgical pain Pain Location: Leg Pain Orientation: Right Pain Descriptors / Indicators: Discomfort Pain Onset: Gradual Pain Intervention(s): Emotional support;Repositioned ADL: See Function  Navigator for Current Functional Status.   Therapy/Group: Individual Therapy  Tison Leibold OTR/L 07/22/2017, 12:39 PM

## 2017-07-23 ENCOUNTER — Inpatient Hospital Stay (HOSPITAL_COMMUNITY): Payer: Medicare Other | Admitting: Occupational Therapy

## 2017-07-23 ENCOUNTER — Inpatient Hospital Stay (HOSPITAL_COMMUNITY): Payer: Medicare Other | Admitting: Physical Therapy

## 2017-07-23 ENCOUNTER — Encounter (HOSPITAL_COMMUNITY): Payer: Medicare Other | Admitting: Psychology

## 2017-07-23 DIAGNOSIS — R7309 Other abnormal glucose: Secondary | ICD-10-CM

## 2017-07-23 DIAGNOSIS — I169 Hypertensive crisis, unspecified: Secondary | ICD-10-CM

## 2017-07-23 LAB — CBC
HEMATOCRIT: 25.3 % — AB (ref 39.0–52.0)
HEMOGLOBIN: 7.7 g/dL — AB (ref 13.0–17.0)
MCH: 27 pg (ref 26.0–34.0)
MCHC: 30.4 g/dL (ref 30.0–36.0)
MCV: 88.8 fL (ref 78.0–100.0)
Platelets: 227 10*3/uL (ref 150–400)
RBC: 2.85 MIL/uL — AB (ref 4.22–5.81)
RDW: 14.9 % (ref 11.5–15.5)
WBC: 9.1 10*3/uL (ref 4.0–10.5)

## 2017-07-23 LAB — GLUCOSE, CAPILLARY
Glucose-Capillary: 206 mg/dL — ABNORMAL HIGH (ref 65–99)
Glucose-Capillary: 186 mg/dL — ABNORMAL HIGH (ref 65–99)
Glucose-Capillary: 190 mg/dL — ABNORMAL HIGH (ref 65–99)

## 2017-07-23 LAB — RENAL FUNCTION PANEL
Albumin: 1.9 g/dL — ABNORMAL LOW (ref 3.5–5.0)
Anion gap: 13 (ref 5–15)
BUN: 52 mg/dL — ABNORMAL HIGH (ref 6–20)
CHLORIDE: 94 mmol/L — AB (ref 101–111)
CO2: 24 mmol/L (ref 22–32)
Calcium: 7.7 mg/dL — ABNORMAL LOW (ref 8.9–10.3)
Creatinine, Ser: 7.15 mg/dL — ABNORMAL HIGH (ref 0.61–1.24)
GFR calc non Af Amer: 6 mL/min — ABNORMAL LOW (ref >60)
GFR, EST AFRICAN AMERICAN: 7 mL/min — AB (ref >60)
Glucose, Bld: 140 mg/dL — ABNORMAL HIGH (ref 65–99)
POTASSIUM: 4.4 mmol/L (ref 3.5–5.1)
Phosphorus: 4.1 mg/dL (ref 2.5–4.6)
Sodium: 131 mmol/L — ABNORMAL LOW (ref 135–145)

## 2017-07-23 MED ORDER — SODIUM CHLORIDE 0.9 % IV SOLN: 100.0000 mL | INTRAVENOUS | Status: DC | PRN

## 2017-07-23 MED ORDER — PENTAFLUOROPROP-TETRAFLUOROETH EX AERO: 1.0000 "application " | INHALATION_SPRAY | CUTANEOUS | Status: DC | PRN

## 2017-07-23 MED ORDER — LOSARTAN POTASSIUM 50 MG PO TABS
50.0000 mg | ORAL_TABLET | Freq: Every day | ORAL | Status: DC
  Administered 2017-07-23: 50 mg via ORAL
  Filled 2017-07-23: qty 1

## 2017-07-23 MED ORDER — LIDOCAINE-PRILOCAINE 2.5-2.5 % EX CREA: 1.0000 "application " | TOPICAL_CREAM | CUTANEOUS | Status: DC | PRN

## 2017-07-23 MED ORDER — LOSARTAN POTASSIUM 50 MG PO TABS
50.0000 mg | ORAL_TABLET | Freq: Every day | ORAL | Status: DC
  Administered 2017-07-24 – 2017-07-31 (×8): 50 mg via ORAL
  Filled 2017-07-23 (×8): qty 1

## 2017-07-23 MED ORDER — LIDOCAINE HCL (PF) 1 % IJ SOLN: 5.0000 mL | INTRAMUSCULAR | Status: DC | PRN

## 2017-07-23 MED ORDER — DOXERCALCIFEROL 4 MCG/2ML IV SOLN
INTRAVENOUS | Status: AC
  Filled 2017-07-23: qty 2

## 2017-07-23 MED ORDER — ALTEPLASE 2 MG IJ SOLR: 2.0000 mg | Freq: Once | INTRAMUSCULAR | Status: DC | PRN

## 2017-07-23 MED ORDER — DICLOFENAC SODIUM 1 % TD GEL
2.0000 g | Freq: Four times a day (QID) | TRANSDERMAL | Status: DC
  Administered 2017-07-23 – 2017-07-31 (×29): 2 g via TOPICAL
  Filled 2017-07-23: qty 100

## 2017-07-23 MED ORDER — HEPARIN SODIUM (PORCINE) 1000 UNIT/ML DIALYSIS: 1000.0000 [IU] | INTRAMUSCULAR | Status: DC | PRN

## 2017-07-23 NOTE — Progress Notes (Signed)
Social Work Patient ID: [name], male   DOB: 1935-11-02, 82 y.o.   MRN: [id number]  Spoke with sister via telephone to discuss team conference goals supervision-min assist level and target discharge date 5/23. Sister reports there is no one to be there all of the time with him, they can be in and out. She will be here tomorrow to see him in therapies and to discuss the options for him. She report she was pushing backwards prior to admission and knows how unsafe it is. Will see in am, pt in HD and discuss best option for him at discharge from here.

## 2017-07-23 NOTE — Patient Care Conference (Signed)
Inpatient RehabilitationTeam Conference and Plan of Care Update Date: 07/23/2017   Time: 2:30 PM    Patient Name: Charles Vasquez      Medical Record Number: [id number]  Date of Birth: [birth year] Sex: Male         Room/Bed: 4M11C/4M11C-01 Payor Info: Payor: [employer] / Plan: [employer] MEDICARE / Product Type: *No Product type* /    Admitting Diagnosis: BKA  Admit Date/Time:  07/17/2017  6:34 PM Admission Comments: No comment available   Primary Diagnosis:  <principal problem not specified> Principal Problem: <principal problem not specified>  Patient Active Problem List   Diagnosis Date Noted  . Labile blood glucose   . Hypertensive crisis   . Hypoglycemia   . Leukocytosis   . Acute blood loss anemia   . Anemia of chronic disease   . ESRD on dialysis (HCC)   . Type 2 diabetes mellitus with peripheral neuropathy (HCC)   . Benign essential HTN   . 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 Pressure injury of skin 07/13/2017  . Diabetic foot infection (HCC) 07/12/2017  . CAD (coronary artery disease) 07/12/2017  . Type II diabetes mellitus with renal manifestations (HCC) 07/12/2017  . Wound of right foot   . Loose stools 05/20/2017  . Abdominal pain   . Loss of weight 03/18/2017  . Abnormal CT scan, colon 03/18/2017  . Melena 03/18/2017  . Periumbilical abdominal pain 03/18/2017  . ESRD (end stage renal disease) on dialysis (HCC) 10/22/2016  . Venous hypertension status post AVG procedure 08/10/2016  . Anasarca 08/04/2016  . Facial swelling 08/04/2016  . Effusion, other site 08/04/2016  . 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 Acute respiratory failure (HCC) 08/04/2016  . Hyperkalemia 08/04/2016  . Aftercare following surgery of the circulatory system, NEC-Right  AVGG 09/25/2012  . Other complications due to renal dialysis device, implant, and graft 05/01/2012  . End stage renal disease (HCC) 03/17/2012  . FATIGUE, ACUTE 08/22/2008   . HEMATURIA UNSPECIFIED 07/11/2008  . Diabetes (HCC) 01/19/2008  . Essential hypertension 01/19/2008  . MYOCARDIAL INFARCTION, HX OF 01/19/2008  . GERD 01/19/2008  . OSTEOARTHRITIS 01/19/2008  . CEREBROVASCULAR ACCIDENT, HX OF 01/19/2008    Expected Discharge Date: Expected Discharge Date: 08/01/17  Team Members Present: Physician leading conference: Dr. Maryla Morrow Social Worker Present: Dossie Der, LCSW Nurse Present: Tennis Must, RN PT Present: Alyson Reedy, PT OT Present: Perrin Maltese, OT SLP Present: Jackalyn Lombard, SLP PPS Coordinator present : Tora Duck, RN, CRRN     Current Status/Progress Goal Weekly Team Focus  Medical   Decreased functional mobility secondary to right BKA 07/15/2017  Improve mobility, safety, shoulder pain, DM, Hb, ID, BP, ESRD  See above   Bowel/Bladder   pt is continent of Bowel, HD patient LBM 5/15  continue to be continent of Bowel  monitor bowel patterns   Swallow/Nutrition/ Hydration             ADL's   Min assist for bathing, supervision for UB dressing with max assist for LB dressing sit to stand.  Mod assist for stand pivot transfers to the wheelchair and to the tub bench.  Decreased AROM in the left shoulder with increased pain.   supervision mostly  selfcare retraining, balance retraining, therapeutic exercise, balance retraining, pt/family education, manual therapy   Mobility   S bed mobility, min/modA transfers, S w/c mobility, modA sit <>stand  S overall w/c level, minA car transfers  activity tolerance, transfer  training, ongoing amputee education, sit <>stand   Communication             Safety/Cognition/ Behavioral Observations            Pain   complaining of left shoulder pain  apply voltaren creme or muscle rub QID and PRN  decrease pain so patient will be able to use and fuction with minimal pain   Skin   no skin issues  continue to monitor skin  continue to apply      *See Care Plan and progress notes for long  and short-term goals.     Barriers to Discharge  Current Status/Progress Possible Resolutions Date Resolved   Physician    Medical stability;Wound Care;Weight bearing restrictions;IV antibiotics     See above  Therapies, optimize pain meds, DM/BP meds, follow labs, d/c IV abx tomorow      Nursing                  PT                    OT                  SLP                SW                Discharge Planning/Teaching Needs:  Plans to go home with intermittent assist from sister and friend. Unsure if this will be enough care-may need to pursue other options      Team Discussion:  Goals supervision-min assist wheelchair level. Pt will need 24 hr care due to leans backwards and safety issues. X-ray on left shoulder was negative and MD prescribed voltaren gel which is helping. Adjusting BP meds. Leg spasms MD aware. Look at discharge options and sister to be in tomorrow.  Revisions to Treatment Plan:  DC 5/24    Continued Need for Acute Rehabilitation Level of Care: The patient requires daily medical management by a physician with specialized training in physical medicine and rehabilitation for the following conditions: Daily direction of a multidisciplinary physical rehabilitation program to ensure safe treatment while eliciting the highest outcome that is of practical value to the patient.: Yes Daily medical management of patient stability for increased activity during participation in an intensive rehabilitation regime.: Yes Daily analysis of laboratory values and/or radiology reports with any subsequent need for medication adjustment of medical intervention for : Post surgical problems;Diabetes problems;Blood pressure problems;Wound care problems;Renal problems  Lucy Chris 07/24/2017, 8:45 AM

## 2017-07-23 NOTE — Progress Notes (Addendum)
[facility] PHYSICAL MEDICINE & REHABILITATION     PROGRESS NOTE  Subjective/Complaints:  Patient seen lying in bed this morning. He states he did not sleep well overnight due to left shoulder pain. Reviewed imaging with patient. He states that he has had this for really long time.  ROS: denies CP, SOB, N/V/D  Objective: Vital Signs: Blood pressure (!) 181/71, pulse 79, temperature 98.2 F (36.8 C), temperature source Oral, resp. rate 18, weight 60.5 kg (133 lb 6.1 oz), SpO2 99 %. Dg Shoulder Left  Result Date: 07/22/2017 CLINICAL DATA:  Fall EXAM: LEFT SHOULDER - 2+ VIEW COMPARISON:  07/10/2017 FINDINGS: Vascular calcification. AC joint is intact. Stent graft in the left upper extremity. No fracture or dislocation IMPRESSION: No acute osseous abnormality. Electronically Signed   By: Jasmine Pang M.D.   On: 07/22/2017 20:52   Recent Labs    07/21/17 1449  WBC 10.0  HGB 7.9*  HCT 26.2*  PLT 259   Recent Labs    07/21/17 1449  NA 131*  K 4.2  CL 93*  GLUCOSE 146*  BUN 56*  CREATININE 8.92*  CALCIUM 7.5*   CBG (last 3)  Recent Labs    07/22/17 1700 07/22/17 2208 07/23/17 0654  GLUCAP 117* 166* 206*    Wt Readings from Last 3 Encounters:  07/23/17 60.5 kg (133 lb 6.1 oz)  07/17/17 55.4 kg (122 lb 2.2 oz)  07/11/17 59 kg (130 lb)    Physical Exam:  BP (!) 181/71 (BP Location: Left Arm)   Pulse 79   Temp 98.2 F (36.8 C) (Oral)   Resp 18   Wt 60.5 kg (133 lb 6.1 oz)   SpO2 99%   BMI 20.89 kg/m  Constitutional: No distress . Vital signs reviewed. HENT: Normocephalic.  Atraumatic. Eyes: EOMI. No discharge. Cardiovascular: RRR. No JVD. Respiratory: CTA Bilaterally. Normal effort. GI: BS +. Non-distended. MSK: left shoulder TTP Right BKA edema and tenderness.  RUE: amputation of 2nd digit at PIP LLE: amputation digits  Neurological: He isalertand oriented x2, confused, unchanged. Motor: B/l UE, LLE, Right hip flexion 4+/5 proximal to distal Skin: BKA  with dressing c/d/i  Assessment/Plan: 1. Functional deficits secondary to right BKA which require 3+ hours per day of interdisciplinary therapy in a comprehensive inpatient rehab setting. Physiatrist is providing close team supervision and 24 hour management of active medical problems listed below. Physiatrist and rehab team continue to assess barriers to discharge/monitor patient progress toward functional and medical goals.  Function:  Bathing Bathing position   Position: Shower  Bathing parts Body parts bathed by patient: Right arm, Left arm, Chest, Abdomen, Front perineal area, Right upper leg, Left upper leg Body parts bathed by helper: Left lower leg, Back, Buttocks  Bathing assist Assist Level: Touching or steadying assistance(Pt > 75%)      Upper Body Dressing/Undressing Upper body dressing   What is the patient wearing?: Pull over shirt/dress     Pull over shirt/dress - Perfomed by patient: Thread/unthread right sleeve, Thread/unthread left sleeve, Put head through opening, Pull shirt over trunk Pull over shirt/dress - Perfomed by helper: Thread/unthread right sleeve, Thread/unthread left sleeve, Put head through opening, Pull shirt over trunk        Upper body assist Assist Level: Supervision or verbal cues      Lower Body Dressing/Undressing Lower body dressing   What is the patient wearing?: Pants     Pants- Performed by patient: Thread/unthread left pants leg, Thread/unthread right pants leg Pants- Performed  by helper: Thread/unthread right pants leg, Thread/unthread left pants leg, Pull pants up/down   Non-skid slipper socks- Performed by helper: Don/doff left sock(right sock na)                  Lower body assist Assist for lower body dressing: Touching or steadying assistance (Pt > 75%)      Toileting Toileting   Toileting steps completed by patient: Performs perineal hygiene Toileting steps completed by helper: Adjust clothing prior to toileting,  Adjust clothing after toileting Toileting Assistive Devices: Grab bar or rail  Toileting assist Assist level: Touching or steadying assistance (Pt.75%)   Transfers Chair/bed transfer   Chair/bed transfer method: Lateral scoot Chair/bed transfer assist level: Touching or steadying assistance (Pt > 75%) Chair/bed transfer assistive device: Armrests     Locomotion Ambulation Ambulation activity did not occur: Safety/medical concerns         Wheelchair   Type: Manual Max wheelchair distance: 40' Assist Level: Supervision or verbal cues  Cognition Comprehension Comprehension assist level: Understands basic 90% of the time/cues < 10% of the time  Expression Expression assist level: Expresses basic 90% of the time/requires cueing < 10% of the time.  Social Interaction Social Interaction assist level: Interacts appropriately 90% of the time - Needs monitoring or encouragement for participation or interaction.  Problem Solving Problem solving assist level: Solves basic 75 - 89% of the time/requires cueing 10 - 24% of the time  Memory Memory assist level: Recognizes or recalls 75 - 89% of the time/requires cueing 10 - 24% of the time    Medical Problem List and Plan: 1.Decreased functional mobilitysecondary to right BKA 07/15/2017  Continue therapies  Shrinker ordered 2. DVT Prophylaxis/Anticoagulation: Subcutaneous heparin. Monitor for any bleeding episodes 3. Pain Management:Ultram as needed  -may have left RTC tendonitis/bursitis   Left shoulder x-ray reviewed, relatively unremarkable shoulder joint   -muscle rub, kpad for left shoulder effective   Voltaren gel ordered on 5/15  -Mild right lower limb phantom pain   -Observe for now 4. Mood:Provide emotional support 5. Neuropsych: This patientis?fully capable of making decisions on hisown behalf. 6. Skin/Wound Care:Routine skin checks 7. Fluids/Electrolytes/Nutrition:Routine in and outs  8.Acute on chronic anemia.  Continue Niferex  Hb 7.9 on 5/13  Cont to monitor 9.End-stage renal disease with hemodialysis. Follow-up renal services 10.Diabetes mellitus with peripheral neuropathy. Hemoglobin A1c 7.1.   Lantus 6 units with HD  Lantus 4 units daily d/ced on 5/14  Labile and 5/15 11.ID. Blood cultures Proteus/strep.   Continue course of Rocephin through 5/16 12.Hypertension.   Losartan 25 started on 5/13, increased to 5/15  Hypertensive crisis overnight 13.Constipation. Laxative assistance 14. Leukocytosis: Resolved  WBCs 10.0 on 5/13  Afrebile  Cont to monitor  LOS (Days) 6 A FACE TO FACE EVALUATION WAS PERFORMED  Ankit Karis Juba 07/23/2017 9:02 AM

## 2017-07-23 NOTE — Progress Notes (Signed)
Physical Therapy Session Note  Patient Details  Name: Charles Vasquez MRN: [id number] Date of Birth: 1935/08/16  Today's Date: 07/23/2017 PT Individual Time: 0800-0900 PT Individual Time Calculation (min): 60 min   Short Term Goals: Week 1:  PT Short Term Goal 1 (Week 1): Pt will complete squat pivot transfer with modA consistently PT Short Term Goal 2 (Week 1): Pt will propel manual w/c x 100 ft with SBA PT Short Term Goal 3 (Week 1): Pt will perform sit to stand with modA consistently  Skilled Therapeutic Interventions/Progress Updates: Pt received seated on EOB with RN present administering medication, c/o pain in L shoulder 7/10 and agreeable to treatment. L shoe donned totalA after pt unsuccessfully attempts. Squat pivot min guard towards L side into w/c. Sit >stand with RUE on bedrail minA while therapist pulled up pants totalA. W/c propulsion x100' with BUE and S, slow speed and limited by shoulder pain after longer distance. Squat pivot to mat table minA. Short sitting supine hip flexor stretch off edge of mat with contralateral knee to chest for increased stretch, 2x1 min each. Sit >stand x5 reps with minA; cues for RUE on mat to push up into stand and to reach back prior to sitting to control descent. Unable to perform heel raise LLE in standing. Performed 2x10-15 reps LLE resisted plantarflexion with level 2 theraband, dorsiflexion no resistance with 1/5 MMT, trace activation. Returned to w/c towards R side with stand pivot using R arm rest. PROM R hamstring x2 min; educated pt in importance of maintaining straight knee while in w/c on amputee pad. Returned to room totalA for energy conservation; remained seated in w/c, quick release belt and chair alarm intact, all needs in reach.      Therapy Documentation Precautions:  Precautions Precautions: Fall Restrictions Weight Bearing Restrictions: Yes RLE Weight Bearing: Non weight bearing     See Function Navigator for Current  Functional Status.   Therapy/Group: Individual Therapy  Vista Lawman 07/23/2017, 9:09 AM

## 2017-07-23 NOTE — Treatment Plan (Deleted)
Does pt need Voltaren Gel and Muscle Rub both to be applied bid to left shoulder or should one be d/c?

## 2017-07-23 NOTE — Progress Notes (Signed)
Physical Therapy Note  Patient Details  Name: Charles Vasquez MRN: [id number] Date of Birth: 1936/01/09 Today's Date: 07/23/2017    Time: 1130-1148 18 minutes  1:1 Pt continues with c/o Lt shoulder pain.  Also pt c/o fatigue, difficulty keeping eyes open due to fatigue. Pt requests to return to bed for dialysis.  Pt performs squat pivot transfer to bed with min A, sit to supine and scooting up in bed with supervision.  PT left to get ice for pt's left shoulder, pt sleeping soundly upon PT return to room.  PT positioned ice on pt's shoulder and RN aware of ice pack on pt's shoulder.  Pt left in bed with needs at hand, alarm set.   DONAWERTH,KAREN 07/23/2017, 11:49 AM

## 2017-07-23 NOTE — Progress Notes (Signed)
Occupational Therapy Session Note  Patient Details  Name: Charles Vasquez MRN: [id number] Date of Birth: [birth year]  Today's Date: 07/23/2017 OT Individual Time: 1003-1058 OT Individual Time Calculation (min): 55 min    Short Term Goals: Week 1:  OT Short Term Goal 1 (Week 1): Charles Vasquez will be able to transfer on and off a toilet with mod A. OT Short Term Goal 2 (Week 1): Charles Vasquez will be able to consistently sit to stand at sink with min A. OT Short Term Goal 3 (Week 1): Charles Vasquez will be able to don shirt with mod A or less. OT Short Term Goal 4 (Week 1): Charles Vasquez will be able to don pants over feet with min A. OT Short Term Goal 5 (Week 1): Charles Vasquez will bathe with min A.  Skilled Therapeutic Interventions/Progress Updates:    Charles Vasquez completed toilet transfer squat pivot with mod assist to start session.  Mod assist for standing balance in order to manage clothing as well.  Charles Vasquez completed toilet hygiene in sitting with supervision using the LUE but then needed max assist to stand an pull up brief and sweat pants.  Mod assist for squat pivot transfer to the left to get back in the wheelchair.  Once in the chair, had Charles Vasquez propel his wheelchair down to the ortho gym with supervision.  Charles Vasquez transferred stand pivot to the therapy mat with max assist using the RW to take 2-3 hops.  Increased posterior LOB when attempting hopping.  Once on the mat had Charles Vasquez transition to supine.  Therapist completed scapular and glenohumeral mobilizations and then worked on [employer] for shoulder flexion and abduction with external rotation.  Charles Vasquez with increased tightness noted in the pectoral and internal rotators.  Charles Vasquez demonstrated decreased ability to relax the arm and let therapist perform PROM.  Unsure if this could be related to a history of CVA.  Charles Vasquez with increased pain in the anterior part of the glenohumeral joint with AAROM and stretching.  Finished with AAROM shoulder flexion in supine using a cane.  Charles Vasquez with decreased ability to sequence using both UEs  simultaneously.  Needed mod assist to complete.  Had Charles Vasquez transfer back to the wheelchair with mod assist squat pivot to the left and then took him back to the room.  Charles Vasquez left in the wheelchair with call button and phone in reach.  Safety belt and chair alarm in place.    Therapy Documentation Precautions:  Precautions Precautions: Fall Restrictions Weight Bearing Restrictions: Yes RLE Weight Bearing: Non weight bearing  Pain: Pain Assessment Pain Scale: Faces Pain Score: 0-No pain Faces Pain Scale: Hurts a little bit Pain Type: Acute pain Pain Location: Shoulder Pain Orientation: Left Pain Descriptors / Indicators: Discomfort Pain Onset: With Activity Pain Intervention(s): Repositioned ADL: See Function Navigator for Current Functional Status.   Therapy/Group: Individual Therapy  Shawnee Gambone OTR/L 07/23/2017, 12:30 PM

## 2017-07-23 NOTE — Progress Notes (Signed)
Physical Therapy Session Note  Patient Details  Name: Charles Vasquez MRN: [id number] Date of Birth: [birth year]  Today's Date: 07/23/2017 PT Individual Time: 0900-0930 PT Individual Time Calculation (min): 30 min   Short Term Goals: Week 1:  PT Short Term Goal 1 (Week 1): Pt will complete squat pivot transfer with modA consistently PT Short Term Goal 2 (Week 1): Pt will propel manual w/c x 100 ft with SBA PT Short Term Goal 3 (Week 1): Pt will perform sit to stand with modA consistently  Skilled Therapeutic Interventions/Progress Updates: Pt presented in w/c agreeable to therapy. Pt c/o L shoulder pain 9/10. Pt propelled w/c 130f supervision level, PTA transported remaining distance due to increased shoulder pain. Performed squat pivot transfer to mat min guard. Pt participated in sit to/from stand at mat x 3 with cues for hand placement of RUE and foot placement to avoid slipping of LLE. Noted improvement with trials. Pt returned to w/c via squat pivot and transported back to room. Pt remained in w/c at end of session with call bell within reach, quick release belt placed, and needs met.      Therapy Documentation Precautions:  Precautions Precautions: Fall Restrictions Weight Bearing Restrictions: Yes RLE Weight Bearing: Non weight bearing General:   Vital Signs: Therapy Vitals Temp: 97.7 F (36.5 C) Temp Source: Oral Pulse Rate: 73 Resp: 16 BP: (!) 162/66 Patient Position (if appropriate): Lying Oxygen Therapy O2 Device: Room Air   See Function Navigator for Current Functional Status.   Therapy/Group: Individual Therapy  Charles Vasquez  Charles Vasquez, PTA  07/23/2017, 3:50 PM

## 2017-07-23 NOTE — Consult Note (Signed)
Neuropsychological Consultation   Patient:   Charles Vasquez   DOB:   14-Jan-1936  MR Number:  [id number]  Location:  [facility] [address] [facility] [id number] [place] [address] Dept: [phone] Loc: [phone]           Date of Service:   07/23/2017  Start Time:   11 AM End Time:   12 PM  Provider/Observer:  Arley Phenix, Psy.D.       Clinical Neuropsychologist       Billing Code/Service: [id number] 4 Units  Chief Complaint:    Jerrett Baldinger is a 82 year old male with history of hypertension, CAD/MI ESRD with hemodialysis and diabetes mellitus.  Patient does have problem list descriptions of prior stroke and hear attack.  Presented 07/12/2017 with chronic right foot wound infection and progressive ischemic changes.  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  Patient admitted for CIR program.  Patient has some confusion about his care regarding dialysis and understanding issues with phantom limb symptoms.    Reason for Service:  Mr. Mitchum was referred for neuropsychological consultation due to coping and adjustment issues following BKA.  Below is the HPI for the current admission.  [id number] E. Ramdass is a 82 year old right-handed male with history of hypertension, CAD/MI end-stage renal disease with hemodialysis Monday Wednesdays and Fridays at [facility] in [facility], diabetes mellitus. Patient lives alone. He has a girlfriend that checks on him daily and a sister in the area. One level home. Resented 07/12/2017 with chronic right wound infection progressive ischemic changes. Patient with recent right lower extremity angiogram with attempted revascularization of right posterior vertebral artery was unsuccessful 5 weeks ago. 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.Hospital course pain management. Hemodialysis ongoing as per renal  services. Subcutaneous heparin for DVT prophylaxis. Acute on chronic anemia 8.9 and received 2 units packed red blood cells and monitored. Blood cultures with Proteus/strep currently maintained on Rocephin. Follow-up of left lower extremity arterial duplex completed showing mild plaque noted throughout no evidence of significant stenosis. Physical and occupational therapy evaluations completed with recommendations of physical medicine rehab consult. Patient was admitted for a comprehensive rehab program  Current Status:  Patient did some some mild mental status deficits related to mild memory issues and executive functioning for planning and reasoning issues.  Not severe.  Patient reported that he thought he was "going crazy" last night due to his leg (that had been amputated) moving and jerking all night.  Patient also focused on how long his dialysis was taking.    Behavioral Observation: JIOVANNI HEETER  presents as a 82 y.o.-year-old Right African American Male who appeared his stated age. his dress was Appropriate and he was Well Groomed and his manners were Appropriate to the situation.  his participation was indicative of Appropriate and Redirectable behaviors.  There were any physical disabilities noted.  he displayed an appropriate level of cooperation and motivation.     Interactions:    Active Appropriate and Redirectable  Attention:   abnormal and attention span appeared shorter than expected for age  Memory:   abnormal; remote memory intact, recent memory impaired  Visuo-spatial:  within normal limits  Speech (Volume):  low  Speech:   slurred;    Thought Process:  Coherent and Circumstantial  Though Content:  WNL; not suicidal and not homicidal  Orientation:  person, place and situation  Judgment:   Poor  Planning:   Poor  Affect:    Appropriate  Mood:    Euthymic  Insight:   Fair  Intelligence:   low  Marital Status/Living: Patient has a girlfriend but does  not live with her although she checks on him every day.  Medical History:   Past Medical History:  Diagnosis Date  . Anal pain   . AVF (arteriovenous fistula) (HCC) 05-13-2017 per pt currently AVF access used is left thigh   hx multiple AVF surgery's (previously left upper arm, bilateral thigh) last surgery -- right upper arm creation 11-26-2016  . ESRD (end stage renal disease) on dialysis [facility])    [facility] in [place], [place] on MWF   . History of CVA (cerebrovascular accident)    05-13-2017  per pt stated "thats what they told yrs ago"  per pt no residual  . Hyperlipidemia   . Hypertension   . Myocardial infarction (HCC)   . Stroke (HCC)   . Type 2 diabetes mellitus treated with insulin (HCC)    Psychiatric History:  No prior history of   Family Med/Psych History:  Family History  Problem Relation Age of Onset  . Diabetes Mother   . Hypertension Mother   . AAA (abdominal aortic aneurysm) Mother   . Diabetes Father   . Hypertension Father     Impression/DX:  Bracken Palleschi is a 82 year old male with history of hypertension, CAD/MI ESRD with hemodialysis and diabetes mellitus.  Patient does have problem list descriptions of prior stroke and hear attack.  Presented 07/12/2017 with chronic right foot wound infection and progressive ischemic changes.  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  Patient admitted for CIR program.  Patient has some confusion about his care regarding dialysis and understanding issues with phantom limb symptoms.    Patient did some some mild mental status deficits related to mild memory issues and executive functioning for planning and reasoning issues.  Not severe.  Patient reported that he thought he was "going crazy" last night due to his leg (that had been amputated) moving and jerking all night.  Patient also focused on how long his dialysis was taking.   Worked with patient understanding what is phantom limb and what to expect  going forward do reduce stress he was having not knowing what was happening.          Electronically Signed   _______________________ Arley Phenix, Psy.D.

## 2017-07-23 NOTE — Progress Notes (Signed)
[facility] Progress Note   Subjective:  No c/o  Objective Vitals:   07/22/17 2049 07/23/17 0540 07/23/17 0808 07/23/17 1308  BP: (!) 179/76 (!) 196/72 (!) 181/71 (!) 162/66  Pulse: 80 83 79 73  Resp: 18 18  16   Temp: 97.6 F (36.4 C) 98.2 F (36.8 C)  97.7 F (36.5 C)  TempSrc: Oral Oral  Oral  SpO2: 100% 99%    Weight:  60.5 kg (133 lb 6.1 oz)     Physical Exam Alert no distress No jvd Chest cta bilat RRR no mrg abd soft ntnd scaphoid Ext 1+edema L leg, sp R bka NF, ox 3  Additional Objective Labs: Basic Metabolic Panel: Recent Labs  Lab 07/17/17 1937 07/18/17 2141 07/21/17 1449  NA 133* 138 131*  K 4.3 3.8 4.2  CL 96* 98* 93*  CO2 27 26 24   GLUCOSE 111* 195* 146*  BUN 24* 21* 56*  CREATININE 6.85*  5.44* 5.11* 8.92*  CALCIUM 6.9* 7.6* 7.5*  PHOS 3.9 4.5 4.7*   Liver Function Tests: Recent Labs  Lab 07/17/17 1937 07/18/17 2141 07/21/17 1449  ALBUMIN 1.9* 2.0* 1.9*   No results for input(s): LIPASE, AMYLASE in the last 168 hours. CBC: Recent Labs  Lab 07/17/17 0409 07/17/17 1937 07/21/17 1449  WBC 11.2* 12.8* 10.0  NEUTROABS 9.0*  --   --   HGB 8.9* 8.7* 7.9*  HCT 28.4* 28.1* 26.2*  MCV 87.4 87.3 87.6  PLT 202 201 259   Blood Culture    Component Value Date/Time   SDES BLOOD LEFT HAND 07/12/2017 0758   SPECREQUEST  07/12/2017 0758    BOTTLES DRAWN AEROBIC ONLY Blood Culture results may not be optimal due to an inadequate volume of blood received in culture bottles   CULT  07/12/2017 0758    NO GROWTH 5 DAYS Performed at [facility], [address], [place], [place] [address]    REPTSTATUS 07/17/2017 FINAL 07/12/2017 0758    Cardiac Enzymes: No results for input(s): CKTOTAL, CKMB, CKMBINDEX, TROPONINI in the last 168 hours. CBG: Recent Labs  Lab 07/22/17 1138 07/22/17 1700 07/22/17 2208 07/23/17 0654 07/23/17 1159  GLUCAP 161* 117* 166* 206* 186*   Iron Studies:  No results for input(s): IRON, TIBC,  TRANSFERRIN, FERRITIN in the last 72 hours. @lablastinr3 @ Studies/Results: Dg Shoulder Left  Result Date: 07/22/2017 CLINICAL DATA:  Fall EXAM: LEFT SHOULDER - 2+ VIEW COMPARISON:  07/10/2017 FINDINGS: Vascular calcification. AC joint is intact. Stent graft in the left upper extremity. No fracture or dislocation IMPRESSION: No acute osseous abnormality. Electronically Signed   By: Jasmine Pang M.D.   On: 07/22/2017 20:52   Medications: . cefTRIAXone (ROCEPHIN)  IV Stopped (07/22/17 1700)   HD orders: [name] MWF 3hr  2/2.5 bath  350/600  Hep 2000 then 500 u/hr -Epogen 7200 unit IV TIW -Hectorol 2 mcg IV TIW       Impression: 1  Diabetic foot wound S/P R BKA 07/15/17 per [name] 2  Proteus/ strep bacteremia - rocephin IV through 5/16 3  ESRD -MWF [name] 4  Anemia - HGB 8.7. Getting darbe 200 mcg IV q week, last dose 5/13 here. Cont oral Fe.  5  Secondary hyperparathyroidism - Last Ca 7.6 C Ca 9.2 Phos 4.5. Continue binders.  6  HTN/volume - BP's high, will resume home losartan at 50 qd and ^ug goal today to 2-3L 7  Nutrition - Albumin 2.0. Renal/Carb mod diet. Add prostat, renal  vit.  8  DM-per primary  Plan - HD today, add losartan, Payton Spark MD [facility] pager [phone]   07/23/2017, 2:26 PM

## 2017-07-24 ENCOUNTER — Inpatient Hospital Stay (HOSPITAL_COMMUNITY): Payer: Medicare Other | Admitting: Physical Therapy

## 2017-07-24 ENCOUNTER — Inpatient Hospital Stay (HOSPITAL_COMMUNITY): Payer: Medicare Other | Admitting: Occupational Therapy

## 2017-07-24 DIAGNOSIS — M25512 Pain in left shoulder: Secondary | ICD-10-CM

## 2017-07-24 DIAGNOSIS — R7881 Bacteremia: Secondary | ICD-10-CM

## 2017-07-24 DIAGNOSIS — G8929 Other chronic pain: Secondary | ICD-10-CM

## 2017-07-24 LAB — GLUCOSE, CAPILLARY
GLUCOSE-CAPILLARY: 171 mg/dL — AB (ref 65–99)
GLUCOSE-CAPILLARY: 174 mg/dL — AB (ref 65–99)
GLUCOSE-CAPILLARY: 106 mg/dL — AB (ref 65–99)
Glucose-Capillary: 166 mg/dL — ABNORMAL HIGH (ref 65–99)

## 2017-07-24 MED ORDER — INSULIN GLARGINE 100 UNIT/ML ~~LOC~~ SOLN
8.0000 [IU] | SUBCUTANEOUS | Status: DC
  Administered 2017-07-25 – 2017-07-30 (×3): 8 [IU] via SUBCUTANEOUS
  Filled 2017-07-24 (×5): qty 0.08

## 2017-07-24 NOTE — Discharge Summary (Signed)
Physician Discharge Summary  Charles Vasquez XBO:[id number] DOB: Jul 22, 1935 DOA: 07/11/2017  PCP: Toma Deiters, MD  Admit date: 07/11/2017 Discharge date: 07/24/2017  Admitted From:home Disposition: CIR Recommendations for Outpatient Follow-up:  1. Follow up with PCP in 1-2 weeks 2. Please obtain BMP/CBC in one week  Home Health:NONE Equipment/Devicesnone  Discharge Condition stable CODE STATUS:full Diet recommendation renal Brief/Interim Summary:81 y.o.malewith medical history significant ofhypertension, hyperlipidemia, diabetes mellitus, stroke, GERD, ESRD-HD (MWF), CAD,PVD,who presents with right foot wound infection.  Pt recently underwentright lower extremity angiogram withattempted revascularization of his right posterior tibial artery that was unsuccessful 5 weeks ago. Pt has a right foot heel woundinfection, with serosangious drainage and foul smelling odor.Pt was seen by [facility], Dr. Randie Heinz today.Dressing was changed at office.Pt was told that he might have to have an amputation. Pt was sent to ED for admission forantibiotics and pain control. Patient states that he has some moderate of pain, but no fever or chills. He denies chest pain, shortness of breath, cough, nausea, vomiting, diarrhea or abdominal pain no symptoms of UTI or unilateral weakness. Patient states that he had a dialysis on Friday.  ED Course:pt was found to haveWBC 15.6, lactic acid of 1.24, potassium 4.6, bicarbonate 27, creatinine 5.43, BUN 29, temperature 99.8, no tachycardia, no tachypnea, oxygen saturation 98% on room air. Patient is admitted to telemetry bed as inpatient   Discharge Diagnoses:  Principal Problem:   Diabetic foot infection (HCC) Active Problems:   Essential hypertension   GERD   ESRD (end stage renal disease) on dialysis (HCC)   CAD (coronary artery disease)   Type II diabetes mellitus with renal manifestations (HCC)   Wound of right foot   Pressure injury of skin     1]diabetic foot wound infection/polimicrobial bacteremia-S/P R BKA.further work up per vascular.CIR consult in place. 2]type 2 diabetes-increase lantus. 3]hypertensionblood pressure soft hold Cozaar. 4]ESRD dialysis Monday Wednesday Friday. 5]post op anemia s/p 2 units prbc. 6]dc to cir.     Discharge Instructions   Allergies as of 07/17/2017      Reactions   Lisinopril Other (See Comments)   HYPERKALEMIA RENAL DYSFUNCTION PROGRESSION      Medication List    ASK your doctor about these medications   BIFERA 28 MG Tabs 1 po bid   calcium carbonate 500 MG chewable tablet Commonly known as:  TUMS - dosed in mg elemental calcium Chew 1 tablet by mouth daily as needed for indigestion or heartburn.   carboxymethylcellulose 0.5 % Soln Commonly known as:  REFRESH PLUS Place 1 drop into both eyes 3 (three) times daily as needed (dry eyes). AS DIRECTED   dicyclomine 10 MG capsule Commonly known as:  BENTYL 1 PO 30 MINUTES PRIOR TO BREAKFAST AND LUNCH   insulin glargine 100 UNIT/ML injection Commonly known as:  LANTUS Inject 5 Units into the skin at bedtime.   losartan 50 MG tablet Commonly known as:  COZAAR Take 50 mg by mouth every evening.   multivitamin Tabs tablet Take 1 tablet by mouth daily.   pantoprazole 40 MG tablet Commonly known as:  PROTONIX 1 PO 30 MINUTES PRIOR TO MEALS BID FOR 3 MOS THEN QD   sevelamer carbonate 800 MG tablet Commonly known as:  RENVELA Take 1,600 mg by mouth 3 (three) times daily with meals. Supposed to take one 800 mg with snacks and caregiver said he only does that sometimes   SSD 1 % cream Generic drug:  silver sulfADIAZINE Apply 1 application topically daily.  traMADol 50 MG tablet Commonly known as:  ULTRAM Take 1 tablet (50 mg total) by mouth every 6 (six) hours as needed.       Allergies  Allergen Reactions  . Lisinopril Other (See Comments)    HYPERKALEMIA RENAL DYSFUNCTION PROGRESSION     Consultations:  vascular   Procedures/Studies: Dg Shoulder Left  Result Date: 07/22/2017 CLINICAL DATA:  Fall EXAM: LEFT SHOULDER - 2+ VIEW COMPARISON:  07/10/2017 FINDINGS: Vascular calcification. AC joint is intact. Stent graft in the left upper extremity. No fracture or dislocation IMPRESSION: No acute osseous abnormality. Electronically Signed   By: Jasmine Pang M.D.   On: 07/22/2017 20:52    (Echo, Carotid, EGD, Colonoscopy, ERCP)    Subjective:   Discharge Exam: Vitals:   07/17/17 1210 07/17/17 2228  BP: (!) 163/67 (!) 129/55  Pulse: 80 78  Resp: 18 18  Temp: 98.2 F (36.8 C) 98.3 F (36.8 C)  SpO2: 95% 97%   Vitals:   07/17/17 0400 07/17/17 0818 07/17/17 1210 07/17/17 2228  BP: (!) 168/72 (!) 181/76 (!) 163/67 (!) 129/55  Pulse: 60 79 80 78  Resp: (!) 22 20 18 18   Temp: 98.8 F (37.1 C) 98.5 F (36.9 C) 98.2 F (36.8 C) 98.3 F (36.8 C)  TempSrc: Oral Oral Oral Oral  SpO2: 96% 97% 95% 97%  Weight: 55.4 kg (122 lb 2.2 oz)     Height:        General: Pt is alert, awake, not in acute distress Cardiovascular: RRR, S1/S2 +, no rubs, no gallops Respiratory: CTA bilaterally, no wheezing, no rhonchi Abdominal: Soft, NT, ND, bowel sounds + Extremities: no edema, no cyanosis    The results of significant diagnostics from this hospitalization (including imaging, microbiology, ancillary and laboratory) are listed below for reference.     Microbiology: Recent Results (from the past 240 hour(s))  Surgical pcr screen     Status: None   Collection Time: 07/15/17 12:16 AM  Result Value Ref Range Status   MRSA, PCR NEGATIVE NEGATIVE Final   Staphylococcus aureus NEGATIVE NEGATIVE Final    Comment: (NOTE) The Xpert SA Assay (FDA approved for NASAL specimens in patients 46 years of age and older), is one component of a comprehensive surveillance program. It is not intended to diagnose infection nor to guide or monitor treatment. Performed at [facility], [address], [place], [place] [address]      Labs: BNP (last 3 results) No results for input(s): BNP in the last 8760 hours. Basic Metabolic Panel: Recent Labs  Lab 07/17/17 1937 07/18/17 2141 07/21/17 1449 07/23/17 1655  NA 133* 138 131* 131*  K 4.3 3.8 4.2 4.4  CL 96* 98* 93* 94*  CO2 27 26 24 24   GLUCOSE 111* 195* 146* 140*  BUN 24* 21* 56* 52*  CREATININE 6.85*  5.44* 5.11* 8.92* 7.15*  CALCIUM 6.9* 7.6* 7.5* 7.7*  PHOS 3.9 4.5 4.7* 4.1   Liver Function Tests: Recent Labs  Lab 07/17/17 1937 07/18/17 2141 07/21/17 1449 07/23/17 1655  ALBUMIN 1.9* 2.0* 1.9* 1.9*   No results for input(s): LIPASE, AMYLASE in the last 168 hours. No results for input(s): AMMONIA in the last 168 hours. CBC: Recent Labs  Lab 07/17/17 1937 07/21/17 1449 07/23/17 1605  WBC 12.8* 10.0 9.1  HGB 8.7* 7.9* 7.7*  HCT 28.1* 26.2* 25.3*  MCV 87.3 87.6 88.8  PLT 201 259 227   Cardiac Enzymes: No results for input(s): CKTOTAL, CKMB, CKMBINDEX, TROPONINI in  the last 168 hours. BNP: Invalid input(s): POCBNP CBG: Recent Labs  Lab 07/23/17 0654 07/23/17 1159 07/23/17 2152 07/24/17 0643 07/24/17 1121  GLUCAP 206* 186* 190* 171* 174*   D-Dimer No results for input(s): DDIMER in the last 72 hours. Hgb A1c No results for input(s): HGBA1C in the last 72 hours. Lipid Profile No results for input(s): CHOL, HDL, LDLCALC, TRIG, CHOLHDL, LDLDIRECT in the last 72 hours. Thyroid function studies No results for input(s): TSH, T4TOTAL, T3FREE, THYROIDAB in the last 72 hours.  Invalid input(s): FREET3 Anemia work up No results for input(s): VITAMINB12, FOLATE, FERRITIN, TIBC, IRON, RETICCTPCT in the last 72 hours. Urinalysis    Component Value Date/Time   COLORURINE YELLOW 12/23/2016 2200   APPEARANCEUR CLEAR 12/23/2016 2200   LABSPEC 1.008 12/23/2016 2200   PHURINE 7.0 12/23/2016 2200   GLUCOSEU >=500 (A) 12/23/2016 2200   HGBUR NEGATIVE 12/23/2016 2200   BILIRUBINUR  NEGATIVE 12/23/2016 2200   KETONESUR NEGATIVE 12/23/2016 2200   PROTEINUR 100 (A) 12/23/2016 2200   NITRITE NEGATIVE 12/23/2016 2200   LEUKOCYTESUR NEGATIVE 12/23/2016 2200   Sepsis Labs Invalid input(s): PROCALCITONIN,  WBC,  LACTICIDVEN Microbiology Recent Results (from the past 240 hour(s))  Surgical pcr screen     Status: None   Collection Time: 07/15/17 12:16 AM  Result Value Ref Range Status   MRSA, PCR NEGATIVE NEGATIVE Final   Staphylococcus aureus NEGATIVE NEGATIVE Final    Comment: (NOTE) The Xpert SA Assay (FDA approved for NASAL specimens in patients 38 years of age and older), is one component of a comprehensive surveillance program. It is not intended to diagnose infection nor to guide or monitor treatment. Performed at [facility], [address], [place], [place] [address]      Time coordinating discharge:33 minutes  SIGNED:   [name], MD  [facility] 07/24/2017, 12:05 PM Pager   If 7PM-7AM, please contact night-coverage [email or web address] Password [email or web address]

## 2017-07-24 NOTE — Progress Notes (Signed)
Orthopedic Tech Progress Note Patient Details:  Charles Vasquez 1936-02-15 [id number]  Patient ID: [name], male   DOB: 1935/10/01, 82 y.o.   MRN: [id number]   Nikki Dom 07/24/2017, 11:57 AM Called in hanger brace order; spoke with Morrie Sheldon

## 2017-07-24 NOTE — Progress Notes (Signed)
Physical Therapy Session Note  Patient Details  Name: BAROK BONNIE MRN: [id number] Date of Birth: 1935-06-20  Today's Date: 07/24/2017 PT Individual Time: 0900-1000 and 1415-1530 PT Individual Time Calculation (min): 60 min and 75 min (total 135 min)   Short Term Goals: Week 1:  PT Short Term Goal 1 (Week 1): Pt will complete squat pivot transfer with modA consistently PT Short Term Goal 2 (Week 1): Pt will propel manual w/c x 100 ft with SBA PT Short Term Goal 3 (Week 1): Pt will perform sit to stand with modA consistently  Skilled Therapeutic Interventions/Progress Updates: Tx 1: Pt received seated in w/c, c/o shoulder pain 7/10 pre-medicated and voltaren gel applied; agreeable to treatment. W/c propulsion x10' before pt declining additional distance d/t shoulder pain. Squat pivot w/c>mat towards L side with close S and min cues for technique. Sit >supine with S. TENS applied to L shoulder for pain management x12 min on level 38 while performing LE exercises. Performed 2x15 straight leg raise, hip abduction/adduction, bridging on bolster. Sister arrived to observe remainder of session. Performed x5 reps sit >stand with RW and minA; cues for anterior weight shift and aiming nose over L knee d/t tendency to lean R on RUE pushing from mat, causing LOB to R side and backwards. Squat pivot to R into w/c with close S. Returned to room. Squat pivot performed w/c <>bed with therapist providing min guard/close S and cueing; educated pt's sister and girlfriend on assistance needed and safety concerns regarding protecting RLE. Sister performed one transfer w/c <>bed with min guard/close S and minimal cueing from therapist. Remained seated in w/c at end of session, quick release belt intact and all needs in reach.   Tx 2: Pt received seated in w/c, c/o pain as above in shoulder, "might feel better" following e-stim earlier today. Squat pivot transfer w/c <>nustep with min guard. Performed RUE/LLE nustep x8  min total at 30 sec-1:30 per trial before requiring rest break d/t fatigue. Educated pt in importance of improving aerobic endurance in preparation for prosthetic training. Performed car transfer min guard into car, minA out of car d/t elevated seat height and pt unwilling to perform with LLE on floor and opted to put foot into car before transferring in, leaving foot in car before transferring out. Performed recliner transfer min guard downhill to recliner, modA from recliner>w/c. Supine bridging on bolster 2x15, short arc quads 2x15 with 3# ankle weight LLE. LUE PROM shoulder abduction/flexion/external rotation. Performed in sidelying with mild increase in flexion ROM however note excessive scapular upward rotation with decreased glenohumeral joint mobility. Soft tissue massage to periscapular musculature with several palpable trigger points in serratus anterior, subscapularis, lats, infraspinatus. Seated BUE stability ball pushes with therapist providing stabilization to scapula to facilitate glenohumeral AROM. Returned to w/c min guard squat pivot. Remained seated in w/c at end of session, chair alarm and quick release belt intact, all needs in reach.      Therapy Documentation Precautions:  Precautions Precautions: Fall Restrictions Weight Bearing Restrictions: Yes RLE Weight Bearing: Non weight bearing  See Function Navigator for Current Functional Status.   Therapy/Group: Individual Therapy  Vista Lawman 07/24/2017, 11:24 AM

## 2017-07-24 NOTE — Progress Notes (Signed)
[place] PHYSICAL MEDICINE & REHABILITATION     PROGRESS NOTE  Subjective/Complaints:  Charles Vasquez seen sitting up in bed this AM being fed breakfast by friend.  He states he slept well overnight.  He note significant improvement in left shoulder pain.  Discussed stump dressing with nursing.  ROS: Denies CP, SOB, N/V/D  Objective: Vital Signs: Blood pressure (!) 150/60, pulse 76, temperature 98.3 F (36.8 C), temperature source Oral, resp. rate 16, weight 60.4 kg (133 lb 2.5 oz), SpO2 97 %. Dg Shoulder Left  Result Date: 07/22/2017 CLINICAL DATA:  Fall EXAM: LEFT SHOULDER - 2+ VIEW COMPARISON:  07/10/2017 FINDINGS: Vascular calcification. AC joint is intact. Stent graft in the left upper extremity. No fracture or dislocation IMPRESSION: No acute osseous abnormality. Electronically Signed   By: Jasmine Pang M.D.   On: 07/22/2017 20:52   Recent Labs    07/21/17 1449 07/23/17 1605  WBC 10.0 9.1  HGB 7.9* 7.7*  HCT 26.2* 25.3*  PLT 259 227   Recent Labs    07/21/17 1449 07/23/17 1655  NA 131* 131*  K 4.2 4.4  CL 93* 94*  GLUCOSE 146* 140*  BUN 56* 52*  CREATININE 8.92* 7.15*  CALCIUM 7.5* 7.7*   CBG (last 3)  Recent Labs    07/23/17 1159 07/23/17 2152 07/24/17 0643  GLUCAP 186* 190* 171*    Wt Readings from Last 3 Encounters:  07/24/17 60.4 kg (133 lb 2.5 oz)  07/17/17 55.4 kg (122 lb 2.2 oz)  07/11/17 59 kg (130 lb)    Physical Exam:  BP (!) 150/60 (BP Location: Left Arm)   Pulse 76   Temp 98.3 F (36.8 C) (Oral)   Resp 16   Wt 60.4 kg (133 lb 2.5 oz)   SpO2 97%   BMI 20.86 kg/m  Constitutional: No distress . Vital signs reviewed. HENT: Normocephalic.  Atraumatic. Eyes: EOMI. No discharge. Cardiovascular: RRR. No JVD. Respiratory: CTA Bilaterally. Normal effort. GI: BS +. Non-distended. MSK: Minimal TTP Right BKA edema and tenderness.  RUE: amputation of 2nd digit at PIP LLE: amputation digits  Neurological: He isalertand oriented x2, confused,  stable. Motor: B/l UE, LLE, Right hip flexion 4+/5 proximal to distal (stable) Skin: BKA with dressing c/d/i  Assessment/Plan: 1. Functional deficits secondary to right BKA which require 3+ hours per day of interdisciplinary therapy in a comprehensive inpatient rehab setting. Physiatrist is providing close team supervision and 24 hour management of active medical problems listed below. Physiatrist and rehab team continue to assess barriers to discharge/monitor patient progress toward functional and medical goals.  Function:  Bathing Bathing position   Position: Shower  Bathing parts Body parts bathed by patient: Right arm, Left arm, Chest, Abdomen, Front perineal area, Right upper leg, Left upper leg Body parts bathed by helper: Left lower leg, Back, Buttocks  Bathing assist Assist Level: Touching or steadying assistance(Charles Vasquez > 75%)      Upper Body Dressing/Undressing Upper body dressing   What is the patient wearing?: Pull over shirt/dress     Pull over shirt/dress - Perfomed by patient: Thread/unthread right sleeve, Thread/unthread left sleeve, Put head through opening, Pull shirt over trunk Pull over shirt/dress - Perfomed by helper: Thread/unthread right sleeve, Thread/unthread left sleeve, Put head through opening, Pull shirt over trunk        Upper body assist Assist Level: Supervision or verbal cues      Lower Body Dressing/Undressing Lower body dressing   What is the patient wearing?: Pants  Pants- Performed by patient: Thread/unthread left pants leg, Thread/unthread right pants leg Pants- Performed by helper: Thread/unthread right pants leg, Thread/unthread left pants leg, Pull pants up/down   Non-skid slipper socks- Performed by helper: Don/doff left sock(right sock na)                  Lower body assist Assist for lower body dressing: Touching or steadying assistance (Charles Vasquez > 75%)      Toileting Toileting   Toileting steps completed by patient: Performs  perineal hygiene Toileting steps completed by helper: Adjust clothing prior to toileting, Adjust clothing after toileting Toileting Assistive Devices: Grab bar or rail  Toileting assist Assist level: Touching or steadying assistance (Charles Vasquez.75%)   Transfers Chair/bed transfer   Chair/bed transfer method: Squat pivot Chair/bed transfer assist level: Moderate assist (Charles Vasquez 50 - 74%/lift or lower) Chair/bed transfer assistive device: Armrests     Locomotion Ambulation Ambulation activity did not occur: Safety/medical concerns         Wheelchair   Type: Manual Max wheelchair distance: 100 Assist Level: Supervision or verbal cues  Cognition Comprehension Comprehension assist level: Understands basic 90% of the time/cues < 10% of the time  Expression Expression assist level: Expresses basic 90% of the time/requires cueing < 10% of the time.  Social Interaction Social Interaction assist level: Interacts appropriately 90% of the time - Needs monitoring or encouragement for participation or interaction.  Problem Solving Problem solving assist level: Solves basic 75 - 89% of the time/requires cueing 10 - 24% of the time  Memory Memory assist level: Recognizes or recalls 75 - 89% of the time/requires cueing 10 - 24% of the time    Medical Problem List and Plan: 1.Decreased functional mobilitysecondary to right BKA 07/15/2017  Continue therapies  Shrinker ordered, not present, discussed with nursing regarding follow up 2. DVT Prophylaxis/Anticoagulation: Subcutaneous heparin. Monitor for any bleeding episodes 3. Pain Management:Ultram as needed  -may have left RTC tendonitis/bursitis   Left shoulder x-ray reviewed, relatively unremarkable shoulder joint   Voltaren gel ordered on 5/15 with good benefit  -Mild right lower limb phantom pain   -Observe for now 4. Mood:Provide emotional support 5. Neuropsych: This patientis?fully capable of making decisions on hisown behalf. 6. Skin/Wound  Care:Routine skin checks 7. Fluids/Electrolytes/Nutrition:Routine in and outs  8.Acute on chronic anemia. Continue Niferex  Hb 7.7 on 5/15  Cont to monitor 9.End-stage renal disease with hemodialysis. Follow-up renal services 10.Diabetes mellitus with peripheral neuropathy. Hemoglobin A1c 7.1.   Lantus 6 units with HD, increased to 8 units on 5/16  Lantus 4 units daily d/ced on 5/14  Elevated 11.ID. Blood cultures Proteus/strep.   Rocephin course to be completed today 12.Hypertension.   Losartan 25 started on 5/13, increased to 5/15  Elevated, but improving on 5/16 13.Constipation. Laxative assistance 14. Leukocytosis: Resolved  WBCs 9.1 on 5/15  Afrebile  Cont to monitor  LOS (Days) 7 A FACE TO FACE EVALUATION WAS PERFORMED  Smita Lesh Karis Juba 07/24/2017 10:25 AM

## 2017-07-24 NOTE — Progress Notes (Signed)
[facility] Progress Note   Subjective:  No c/o  Objective Vitals:   07/23/17 1818 07/23/17 2150 07/23/17 2201 07/24/17 0532  BP: (!) 175/56  (!) 148/86 (!) 150/60  Pulse: 79 88  76  Resp: 18 18  16   Temp: (!) 97.4 F (36.3 C) 98.5 F (36.9 C)  98.3 F (36.8 C)  TempSrc: Oral Oral  Oral  SpO2: 97% 99%  97%  Weight: 57 kg (125 lb 10.6 oz)   60.4 kg (133 lb 2.5 oz)   Physical Exam Alert no distress No jvd Chest cta bilat RRR no mrg abd soft ntnd scaphoid Ext 1+edema L leg, sp R bka NF, ox 3  Additional Objective Labs: Basic Metabolic Panel: Recent Labs  Lab 07/18/17 2141 07/21/17 1449 07/23/17 1655  NA 138 131* 131*  K 3.8 4.2 4.4  CL 98* 93* 94*  CO2 26 24 24   GLUCOSE 195* 146* 140*  BUN 21* 56* 52*  CREATININE 5.11* 8.92* 7.15*  CALCIUM 7.6* 7.5* 7.7*  PHOS 4.5 4.7* 4.1   Liver Function Tests: Recent Labs  Lab 07/18/17 2141 07/21/17 1449 07/23/17 1655  ALBUMIN 2.0* 1.9* 1.9*   No results for input(s): LIPASE, AMYLASE in the last 168 hours. CBC: Recent Labs  Lab 07/17/17 1937 07/21/17 1449 07/23/17 1605  WBC 12.8* 10.0 9.1  HGB 8.7* 7.9* 7.7*  HCT 28.1* 26.2* 25.3*  MCV 87.3 87.6 88.8  PLT 201 259 227   Blood Culture    Component Value Date/Time   SDES BLOOD LEFT HAND 07/12/2017 0758   SPECREQUEST  07/12/2017 0758    BOTTLES DRAWN AEROBIC ONLY Blood Culture results may not be optimal due to an inadequate volume of blood received in culture bottles   CULT  07/12/2017 0758    NO GROWTH 5 DAYS Performed at [facility], [address], [place], [place] [address]    REPTSTATUS 07/17/2017 FINAL 07/12/2017 0758    Cardiac Enzymes: No results for input(s): CKTOTAL, CKMB, CKMBINDEX, TROPONINI in the last 168 hours. CBG: Recent Labs  Lab 07/23/17 0654 07/23/17 1159 07/23/17 2152 07/24/17 0643 07/24/17 1121  GLUCAP 206* 186* 190* 171* 174*   Iron Studies:  No results for input(s): IRON, TIBC, TRANSFERRIN,  FERRITIN in the last 72 hours. @lablastinr3 @ Studies/Results: Dg Shoulder Left  Result Date: 07/22/2017 CLINICAL DATA:  Fall EXAM: LEFT SHOULDER - 2+ VIEW COMPARISON:  07/10/2017 FINDINGS: Vascular calcification. AC joint is intact. Stent graft in the left upper extremity. No fracture or dislocation IMPRESSION: No acute osseous abnormality. Electronically Signed   By: Jasmine Pang M.D.   On: 07/22/2017 20:52   Medications: . sodium chloride    . sodium chloride    . cefTRIAXone (ROCEPHIN)  IV Stopped (07/22/17 1700)   HD orders: [name] MWF 3hr  78kg  2/2.5 bath  350/600  Hep 2000 then 500 u/hr -Epogen 7200 unit IV TIW -Hectorol 2 mcg IV TIW       Impression: 1  S/P R BKA 07/15/17 (for diab foot wound) by [name] 2  Proteus/ strep bacteremia - completed course of rocephin IV 3  ESRD -MWF [name] 4  Anemia - HGB 8.7. Getting darbe 200 mcg IV q week, last dose 5/13 here. Cont oral Fe.  5  Secondary hyperparathyroidism - Last Ca 7.6 C Ca 9.2 Phos 4.5. Continue binders.  6  HTN/volume - BP's high, resumed home losartan at 50 qd  7  Nutrition - Albumin 2.0. Renal/Carb mod  diet. Add prostat, renal vit.  8  DM-per primary 9  Volume - got 1.9L off yest , well below prior dry wt of 78kg   Plan - HD Friday, rehab     Vinson Moselle MD [facility] pager [phone]   07/24/2017, 12:37 PM

## 2017-07-24 NOTE — Progress Notes (Signed)
Social Work Patient ID: Charles Vasquez, male   DOB: 10/23/1935, 82 y.o.   MRN: [id number]  Met with pt, girlfriend and sister who were here for therapies this am. Discussed the care pt will need and who could provide this. Both family members are not able to provide this level of care, they can only do intermittent assist. Girlfriend has spoken with the primary MD and he has suggested going to [facility] for more therapies. Pt will think about this option. Work toward discharge next Friday.

## 2017-07-24 NOTE — Progress Notes (Signed)
Orthopedic Tech Progress Note Patient Details:  [name] 1935/10/30 [id number]  Patient ID: Charles Vasquez, male   DOB: [birth year], 82 y.o.   MRN: [id number]   Nikki Dom 07/24/2017, 12:32 PM Correction: bio-tech order was already called in on 5/10 and bio-tech is handling that order

## 2017-07-24 NOTE — Progress Notes (Signed)
Occupational Therapy Session Note  Patient Details  Name: Charles Vasquez MRN: [id number] Date of Birth: Jun 16, 1935  Today's Date: 07/24/2017 OT Individual Time: 1001-1059 OT Individual Time Calculation (min): 58 min    Short Term Goals: Week 1:  OT Short Term Goal 1 (Week 1): Pt will be able to transfer on and off a toilet with mod A. OT Short Term Goal 2 (Week 1): Pt will be able to consistently sit to stand at sink with min A. OT Short Term Goal 3 (Week 1): Pt will be able to don shirt with mod A or less. OT Short Term Goal 4 (Week 1): Pt will be able to don pants over feet with min A. OT Short Term Goal 5 (Week 1): Pt will bathe with min A.  Skilled Therapeutic Interventions/Progress Updates:   Pt completed bathing and dressing sit to stand.  His girl friend and sister were present for session. Began education for tub transfers using the tub bench in the walk-in shower.  Mod assist for pt to complete squat pivot transfers to and from the bench.  Once on the seat he completed bathing with min guard assist in sitting and lateral leans to wash his peri area.  Dressing sit to stand at the sink.  He needed mod assist for standing balance to pull garments over hips with increased posterior lean noted.  Decreased ability to reach the left foot by crossing it over the right knee for donning sock and shoe.  Pt still with increased left shoulder pain and dysfunction.  He is able to use is some for pulling up pants on the left side but still with increased difficulty with shoulder flexion greater than 60-70 degrees.  Family observed session with therapist emphasizing the need to have 24 hr min assist.    Therapy Documentation Precautions:  Precautions Precautions: Fall Restrictions Weight Bearing Restrictions: Yes RLE Weight Bearing: Non weight bearing  Pain: Pain Assessment Pain Scale: Faces Pain Score: 0-No pain Faces Pain Scale: Hurts little more Pain Type: Chronic pain Pain Location:  Shoulder Pain Orientation: Left Pain Descriptors / Indicators: Discomfort;Aching Pain Intervention(s): Repositioned Multiple Pain Sites: No ADL: See Function Navigator for Current Functional Status.   Therapy/Group: Individual Therapy  Mella Inclan OTR/L 07/24/2017, 12:21 PM

## 2017-07-25 ENCOUNTER — Inpatient Hospital Stay (HOSPITAL_COMMUNITY): Payer: Medicare Other | Admitting: Physical Therapy

## 2017-07-25 ENCOUNTER — Inpatient Hospital Stay (HOSPITAL_COMMUNITY): Payer: Medicare Other | Admitting: Occupational Therapy

## 2017-07-25 ENCOUNTER — Inpatient Hospital Stay (HOSPITAL_COMMUNITY): Payer: Medicare Other

## 2017-07-25 LAB — RENAL FUNCTION PANEL
ALBUMIN: 2 g/dL — AB (ref 3.5–5.0)
Anion gap: 15 (ref 5–15)
BUN: 50 mg/dL — AB (ref 6–20)
CALCIUM: 8.2 mg/dL — AB (ref 8.9–10.3)
CHLORIDE: 94 mmol/L — AB (ref 101–111)
CO2: 24 mmol/L (ref 22–32)
Creatinine, Ser: 8.36 mg/dL — ABNORMAL HIGH (ref 0.61–1.24)
GFR calc Af Amer: 6 mL/min — ABNORMAL LOW (ref >60)
GFR, EST NON AFRICAN AMERICAN: 5 mL/min — AB (ref >60)
Glucose, Bld: 178 mg/dL — ABNORMAL HIGH (ref 65–99)
Phosphorus: 4.9 mg/dL — ABNORMAL HIGH (ref 2.5–4.6)
Potassium: 5.1 mmol/L (ref 3.5–5.1)
SODIUM: 133 mmol/L — AB (ref 135–145)

## 2017-07-25 LAB — CBC
HCT: 25.1 % — ABNORMAL LOW (ref 39.0–52.0)
Hemoglobin: 7.8 g/dL — ABNORMAL LOW (ref 13.0–17.0)
MCH: 27.6 pg (ref 26.0–34.0)
MCHC: 31.1 g/dL (ref 30.0–36.0)
MCV: 88.7 fL (ref 78.0–100.0)
Platelets: 246 10*3/uL (ref 150–400)
RBC: 2.83 MIL/uL — AB (ref 4.22–5.81)
RDW: 15.3 % (ref 11.5–15.5)
WBC: 30.4 10*3/uL — AB (ref 4.0–10.5)

## 2017-07-25 LAB — GLUCOSE, CAPILLARY
GLUCOSE-CAPILLARY: 90 mg/dL (ref 65–99)
GLUCOSE-CAPILLARY: 115 mg/dL — AB (ref 65–99)
GLUCOSE-CAPILLARY: 116 mg/dL — AB (ref 65–99)

## 2017-07-25 MED ORDER — HEPARIN SODIUM (PORCINE) 1000 UNIT/ML DIALYSIS
3000.0000 [IU] | Freq: Once | INTRAMUSCULAR | Status: DC
  Filled 2017-07-25: qty 3

## 2017-07-25 NOTE — Progress Notes (Signed)
Occupational Therapy Session Note  Patient Details  Name: Charles Vasquez MRN: [id number] Date of Birth: 1935-06-04  Today's Date: 07/25/2017 OT Individual Time: 0700-0800 OT Individual Time Calculation (min): 60 min    Short Term Goals: Week 1:  OT Short Term Goal 1 (Week 1): Pt will be able to transfer on and off a toilet with mod A. OT Short Term Goal 1 - Progress (Week 1): Met OT Short Term Goal 2 (Week 1): Pt will be able to consistently sit to stand at sink with min A. OT Short Term Goal 2 - Progress (Week 1): Not met OT Short Term Goal 3 (Week 1): Pt will be able to don shirt with mod A or less. OT Short Term Goal 3 - Progress (Week 1): Met OT Short Term Goal 4 (Week 1): Pt will be able to don pants over feet with min A. OT Short Term Goal 4 - Progress (Week 1): Met OT Short Term Goal 5 (Week 1): Pt will bathe with min A. OT Short Term Goal 5 - Progress (Week 1): Met  Skilled Therapeutic Interventions/Progress Updates:    1:1. Pt reporting 8/10 pain in residual limb. RN adminsters medication.  PT requires min A for trunk facilitation supine to sit. Pt squat pivot transfer with min-mod A and VC for and foot placemetn. Pt cued to brush teeth and requires increased time to initate grooming and locate items. Pt unaware that toothpaste did not get squeezed onto toothbrush despite cueing. Pt lethargic throughout session and frequently falling asleep at sink. Pt allows water to overfill bucket in sink and terminates waterflow with oberservational  Cue of overflowing water. PT demo decreased grip strengh, manipulation, and coordination impacting ability to use wash cloth and hold grooming items.  Pt requires tactile cues to lean forward when doffing shirt. Pt washes UB at sink with A to wash back and cueing for sequencing, attention/arousal, and noticing dropped objects. Pt dons shirt with A for orientation of clothing and VC for arousal an sequencing. PT sit to stand at sink with VC for hand  placement and steadying A while OT pulls pants off hips pt able to doff pants off BLE and sock with VC  For crossing legs. Pt declines washing peri area and buttocks. Pt washes BLE and applies lotion with VC for thoroughness. PT able to thread BLE into pants with A from OT to retrieve pants from floor when pt has grip slip on waisteband. OT dons sock and pt sit to stand at sink as stated above. OT set up ot with breakfast and exited session with pt seated in w/c, call light in reach, QRB/chair alarm on and all needs met  Therapy Documentation Precautions:  Precautions Precautions: Fall Restrictions Weight Bearing Restrictions: Yes RLE Weight Bearing: Non weight bearing  See Function Navigator for Current Functional Status.   Therapy/Group: Individual Therapy  Tonny Branch 07/25/2017, 7:20 AM

## 2017-07-25 NOTE — Progress Notes (Signed)
Occupational Therapy Weekly Progress Note  Patient Details  Name: Charles Vasquez MRN: [id number] Date of Birth: January 12, 1936  Beginning of progress report period: Jul 18, 2017 End of progress report period: Jul 25, 2017  Today's Date: 07/25/2017 OT Individual Time: 0017-4944 OT Individual Time Calculation (min): 54 min    Patient has met 4 of 5 short term goals.  Mr. Desroches is making slow but steady progress with OT at this time.  He is able to perform UB bathing and dressing at min assist level.  He can complete LB bathing with min assist in sitting with lateral leans, but needs mod assist of attempting to complete with min assist sit to stand.  Max assist is need for LB dressing sit to stand secondary to decreased standing balance with posterior lean and decreased ability to reach the left foot for donning socks and shoes.  Mr. Matney needs mod assist for squat pivot transfers with max assist for attempted stand pivot transfers using the RW for support.  Increased left shoulder pain is present as well as decreased AROM for shoulder adduction and shoulder flexion.  His shoulder dysfunction has been present for over the last month per his report and he has stated some relief with topical pain meds and kinesiotaping.  Feel overall pt is making steady gains, however he will likely not reach supervision level goals.  Will downgrade goals to min assist overall with recommendations for 24 hour assist at discharge.  Pt does not have consistent 24 hour help and will likely benefit from follow-up SNF rehab at discharge.  Will continue with current OT POC at this time until d/c arrangements have been made.    Patient continues to demonstrate the following deficits: muscle weakness and muscle joint tightness and decreased sitting balance, decreased standing balance and decreased balance strategies and therefore will continue to benefit from skilled OT intervention to enhance overall performance with BADL.  Patient  progressing toward long term goals..  Continue plan of care.  OT Short Term Goals Week 2:  OT Short Term Goal 1 (Week 2): Pt will continue working on downgraded min assist level goals for discharge.   Skilled Therapeutic Interventions/Progress Updates:    Pt completed supine to sit with min assist in order to take his medication.  He demonstrated increased sleepiness this session compared to previous sessions, but still agreed to participate.  He was able to complete squat pivot transfer from bed to wheelchair with mod assist.  Therapist then took him down to the gym where he transferred stand pivot with the RW to the mat.  Increased posterior lean as well as inability to move his LLE for hops to the mat.  Once on the mat, had pt transition to supine.  Therapist applied moist head to the left shoulder for 10 mins without any adverse reactions.  Therapist then performed PROM and glenohumeral mobilizations to the left shoulder.  He was able to tolerate PROM shoulder scaption to 120 degrees with some anterior shoulder pain.  Transitioned to sitting to work on BUE shoulder extension scapular retraction exercises.  He needed mod demonstrational cueing to complete 2 sets of 10 using light resistance orange therapy band.  Finished session with transfer back to the room and pt wanting to be left up in the chair.  Safety belt in place with chair alarm as well.  Therapy Documentation Precautions:  Precautions Precautions: Fall Restrictions Weight Bearing Restrictions: Yes RLE Weight Bearing: Non weight bearing  Pain: Pain Assessment  Pain Scale: Faces Faces Pain Scale: Hurts a little bit Pain Type: Chronic pain Pain Location: Shoulder Pain Orientation: Left Pain Descriptors / Indicators: Discomfort Pain Onset: With Activity Pain Intervention(s): Heat applied;Repositioned ADL: See Function Navigator for Current Functional Status.   Therapy/Group: Individual Therapy  Patsye Sullivant  OTR/L 07/25/2017, 12:11 PM

## 2017-07-25 NOTE — Progress Notes (Signed)
Physical Therapy Weekly Progress Note  Patient Details  Name: Charles Vasquez MRN: [id number] Date of Birth: December 18, 1935  Beginning of progress report period: Jul 18, 2017 End of progress report period: Jul 25, 2017  Today's Date: 07/25/2017 PT Individual Time: 0900-0935 PT Individual Time Calculation (min): 35 min   Patient has met 3 of 3 short term goals.  Pt currently requires S/minA for bed mobility, S/minA squat pivot transfers, min/modA sit <>stand with RW, S w/c mobility. Gait and stand pivots with RW not initiated d/t ongoing L shoulder pain and generalized weakness. Patient is demonstrating steady progress towards goals as strength and coordination with mobility tasks improves. Pt's family has been present for therapy sessions and reports they are unable to provide 24/7 close S that pt will require at d/c; CSW pursuing SNF placement.  Patient continues to demonstrate the following deficits muscle weakness, muscle joint tightness and muscle paralysis, decreased cardiorespiratoy endurance, abnormal tone, unbalanced muscle activation and decreased coordination, decreased problem solving, decreased safety awareness, decreased memory and delayed processing and decreased standing balance, decreased postural control, decreased balance strategies and difficulty maintaining precautions and therefore will continue to benefit from skilled PT intervention to increase functional independence with mobility.  Patient progressing toward long term goals..  Continue plan of care.  PT Short Term Goals Week 1:  PT Short Term Goal 1 (Week 1): Pt will complete squat pivot transfer with modA consistently PT Short Term Goal 1 - Progress (Week 1): Met PT Short Term Goal 2 (Week 1): Pt will propel manual w/c x 100 ft with SBA PT Short Term Goal 2 - Progress (Week 1): Met PT Short Term Goal 3 (Week 1): Pt will perform sit to stand with modA consistently PT Short Term Goal 3 - Progress (Week 1): Met Week 2:  PT  Short Term Goal 1 (Week 2): =LTG due to estimated LOS  Skilled Therapeutic Interventions/Progress Updates: Pt received asleep sitting in w/c with a fork with food on it in his hand, slow to arouse and difficulty maintaining arousal throughout session. Pt reports wanting to finish breakfast; requires max repetitive cueing to remain awake to complete self feeding task. Pt picked up cup of coffee (cooled already to close to room temp) and fell asleep before he was able to bring cup to his mouth, spilling on himself and the floor. Changed shirt totalA with pt falling asleep between each step of dressing. Pt taken out of room in w/c with hope of increased auditory/vestibular stimulation to assist with arousal. Sit >stand in parallel bars minA, pt falling asleep standing up and therapist returned pt to sitting. Squat pivot transfer to bed modA; pt lay down sideways in bed and fell immediately asleep; required max cueing and assist to reposition in bed. RN aware of significant lethargy, pt reporting he did not sleep well last night, no new medications contributing. Remained in bed, alarm intact and all needs in reach.     Therapy Documentation Precautions:  Precautions Precautions: Fall Restrictions Weight Bearing Restrictions: Yes RLE Weight Bearing: Non weight bearing General: PT Amount of Missed Time (min): 25 Minutes PT Missed Treatment Reason: Other (Comment)(severe lethargy) Pain: Pain Assessment Pain Scale: 0-10 Pain Score: 6  Pain Type: Chronic pain Pain Location: Shoulder Pain Orientation: Left Pain Descriptors / Indicators: Aching;Constant Pain Frequency: Constant Pain Onset: On-going Patients Stated Pain Goal: 3 Pain Intervention(s): Medication (See eMAR)   See Function Navigator for Current Functional Status.  Therapy/Group: Individual Therapy  Luberta Mutter 07/25/2017,  9:40 AM

## 2017-07-25 NOTE — Progress Notes (Signed)
[facility]IC[facility] RE[facility]ABILITATION     PROGRESS NOTE  Subjective/Complaints:   No issues except poor sleep overnite  ROS: Denies CP, SOB, N/V/D  Objective: Vital Signs: Blood pressure 138/62, pulse (!) 108, temperature 98.4 F (36.9 C), temperature source Oral, resp. rate 16, weight 57.2 kg (126 lb 1.7 oz), SpO2 98 %. No results found. Recent Labs    07/23/17 1605  WBC 9.1  HGB 7.7*  HCT 25.3*  PLT 227   Recent Labs    07/23/17 1655  NA 131*  K 4.4  CL 94*  GLUCOSE 140*  BUN 52*  CREATININE 7.15*  CALCIUM 7.7*   CBG (last 3)  Recent Labs    07/24/17 1648 07/24/17 2118 07/25/17 0636  GLUCAP 166* 106* 90    Wt Readings from Last 3 Encounters:  07/25/17 57.2 kg (126 lb 1.7 oz)  07/17/17 55.4 kg (122 lb 2.2 oz)  07/11/17 59 kg (130 lb)    Physical Exam:  BP 138/62 (BP Location: Left Arm)   Pulse (!) 108   Temp 98.4 F (36.9 C) (Oral)   Resp 16   Wt 57.2 kg (126 lb 1.7 oz)   SpO2 98%   BMI 19.75 kg/m  Constitutional: No distress . Vital signs reviewed. HENT: Normocephalic.  Atraumatic. Eyes: EOMI. No discharge. Cardiovascular: RRR. No JVD. Respiratory: CTA Bilaterally. Normal effort. GI: BS +. Non-distended. MSK: Minimal TTP Right BKA edema and tenderness.  RUE: amputation of 2nd digit at PIP LLE: amputation digits  Neurological: He isalertand oriented x2, confused, stable. Motor: B/l UE, LLE, Right hip flexion 4+/5 proximal to distal (stable) Skin: BKA with dressing c/d/i  Assessment/Plan: 1. Functional deficits secondary to right BKA which require 3+ hours per day of interdisciplinary therapy in a comprehensive inpatient rehab setting. Physiatrist is providing close team supervision and 24 hour management of active medical problems listed below. Physiatrist and rehab team continue to assess barriers to discharge/monitor patient progress toward functional and medical goals.  Function:  Bathing Bathing position   Position:  Shower  Bathing parts Body parts bathed by patient: Right arm, Left arm, Chest, Abdomen, Front perineal area, Right upper leg, Left upper leg, Buttocks Body parts bathed by helper: Left lower leg, Back, Buttocks  Bathing assist Assist Level: Touching or steadying assistance(Pt > 75%)      Upper Body Dressing/Undressing Upper body dressing   What is the patient wearing?: Pull over shirt/dress     Pull over shirt/dress - Perfomed by patient: Thread/unthread right sleeve, Thread/unthread left sleeve, Put head through opening, Pull shirt over trunk Pull over shirt/dress - Perfomed by helper: Thread/unthread right sleeve, Thread/unthread left sleeve, Put head through opening, Pull shirt over trunk        Upper body assist Assist Level: Supervision or verbal cues      Lower Body Dressing/Undressing Lower body dressing   What is the patient wearing?: Pants, Shoes, Socks     Pants- Performed by patient: Thread/unthread right pants leg, Thread/unthread left pants leg Pants- Performed by helper: Pull pants up/down   Non-skid slipper socks- Performed by helper: Don/doff left sock     Shoes - Performed by patient: Don/doff left shoe            Lower body assist Assist for lower body dressing: Touching or steadying assistance (Pt > 75%)      Toileting Toileting   Toileting steps completed by patient: Performs perineal hygiene Toileting steps completed by helper: Adjust clothing prior to toileting,  Adjust clothing after toileting Toileting Assistive Devices: Grab bar or rail  Toileting assist Assist level: Touching or steadying assistance (Pt.75%)   Transfers Chair/bed transfer   Chair/bed transfer method: Squat pivot Chair/bed transfer assist level: Touching or steadying assistance (Pt > 75%) Chair/bed transfer assistive device: Armrests     Locomotion Ambulation Ambulation activity did not occur: Safety/medical concerns         Wheelchair   Type: Manual Max wheelchair  distance: 100 Assist Level: Supervision or verbal cues  Cognition Comprehension Comprehension assist level: Understands basic 90% of the time/cues < 10% of the time  Expression Expression assist level: Expresses basic 90% of the time/requires cueing < 10% of the time.  Social Interaction Social Interaction assist level: Interacts appropriately 90% of the time - Needs monitoring or encouragement for participation or interaction.  Problem Solving Problem solving assist level: Solves basic 75 - 89% of the time/requires cueing 10 - 24% of the time  Memory Memory assist level: Recognizes or recalls 75 - 89% of the time/requires cueing 10 - 24% of the time    Medical Problem List and Plan: 1.Decreased functional mobilitysecondary to right BKA 07/15/2017  Continue therapies, tent d/c 5/24  Shrinker ordered, not present, discussed with nursing regarding follow up 2. DVT Prophylaxis/Anticoagulation: Subcutaneous heparin. Monitor for any bleeding episodes 3. Pain Management:Ultram as needed  -may have left RTC tendonitis/bursitis   Left shoulder x-ray reviewed, relatively unremarkable shoulder joint   Voltaren gel ordered on 5/15 with good benefit  -Mild right lower limb phantom pain   -Observe for now 4. Mood:Provide emotional support 5. Neuropsych: This patientis?fully capable of making decisions on hisown behalf. 6. Skin/Wound Care:Routine skin checks 7. Fluids/Electrolytes/Nutrition:Routine in and outs  8.Acute on chronic anemia. Continue Niferex  Hb 7.7 on 5/15  Cont to monitor 9.End-stage renal disease with hemodialysis. Follow-up renal services 10.Diabetes mellitus with peripheral neuropathy. Hemoglobin A1c 7.1.   Lantus 6 units with HD, increased to 8 units on 5/16  Lantus 4 units daily d/ced on 5/14   CBG (last 3)  Recent Labs    07/24/17 1648 07/24/17 2118 07/25/17 0636  GLUCAP 166* 106* 90  Controlled 5/17 11.ID. Blood cultures Proteus/strep.    Rocephin course to be completed 5/16 12.Hypertension.   Losartan 25 started on 5/13, increased to 5/15  Labile on 5/17- monitor Vitals:   07/24/17 2218 07/25/17 0542  BP: (!) 180/69 138/62  Pulse: 84 (!) 108  Resp: 17 16  Temp: 98.5 F (36.9 C) 98.4 F (36.9 C)  SpO2: 100% 98%   13.Constipation. Laxative assistance 14. Leukocytosis: Resolved  WBCs 9.1 on 5/15  Afeb  15.  Insomnia - sleep graph to document pattern LOS (Days) 8 A FACE TO FACE EVALUATION WAS PERFORMED  Erick Colace 07/25/2017 8:09 AM

## 2017-07-25 NOTE — Progress Notes (Signed)
[facility] Progress Note  Subjective: no new issues seen in room  Vitals:   07/24/17 1546 07/24/17 2218 07/25/17 0538 07/25/17 0542  BP: (!) 176/68 (!) 180/69  138/62  Pulse: 75 84  (!) 108  Resp: 18 17  16   Temp: 97.7 F (36.5 C) 98.5 F (36.9 C)  98.4 F (36.9 C)  TempSrc: Oral Oral  Oral  SpO2:  100%  98%  Weight:   57.2 kg (126 lb 1.7 oz)     Inpatient medications: . darbepoetin (ARANESP) injection - DIALYSIS  200 mcg Intravenous Q Mon-HD  . diclofenac sodium  2 g Topical QID  . dicyclomine  10 mg Oral BID WC  . doxercalciferol  2 mcg Intravenous Q M,W,F-HD  . heparin  5,000 Units Subcutaneous Q8H  . insulin aspart  0-9 Units Subcutaneous TID WC  . insulin glargine  8 Units Subcutaneous Q M,W,F-2000  . iron polysaccharides  150 mg Oral BID  . losartan  50 mg Oral Daily  . multivitamin  1 tablet Oral Daily  . pantoprazole  40 mg Oral BID WC  . sevelamer carbonate  1,600 mg Oral TID WC   . sodium chloride    . sodium chloride     sodium chloride, sodium chloride, acetaminophen **OR** acetaminophen, alteplase, alum & mag hydroxide-simeth, calcium carbonate, heparin, lidocaine (PF), lidocaine-prilocaine, loperamide, ondansetron **OR** ondansetron (ZOFRAN) IV, pentafluoroprop-tetrafluoroeth, phenol, polyvinyl alcohol, senna-docusate, sevelamer carbonate, sorbitol, traMADol   Physical Exam Alert no distress No jvd Chest cta bilat RRR no mrg abd soft ntnd scaphoid Ext 1+edema L leg, sp R bka NF, ox 3   HD orders: [name] MWF 3hr  78kg  2/2.5 bath  350/600  Hep 2000 then 500 u/hr -Epogen 7200 unit IV TIW -Hectorol 2 mcg IV TIW       Impression: 1  S/P R BKA 07/15/17 (for diab foot wound) by [name] 2  Proteus/ strep bacteremia - completed course of rocephin IV 3  ESRD -MWF [name] 4  Anemia - HGB 8.7. Getting darbe 200 mcg IV q week, last dose 5/13 here. Cont oral Fe.  5  Secondary hyperparathyroidism - Last Ca 7.6 C Ca 9.2 Phos  4.5. Continue binders.  6  HTN - BP's high, resumed home losartan at 50 qd  7  Nutrition - Albumin 2.0. Renal/Carb mod diet. Add prostat, renal vit.  8  DM-per primary 9  Volume - new dry wt around 56- 58kg (was 78kg?)   Plan - dialysis today    Vinson Moselle MD [facility] pager [phone]   07/25/2017, 1:32 PM   Recent Labs  Lab 07/18/17 2141 07/21/17 1449 07/23/17 1655  NA 138 131* 131*  K 3.8 4.2 4.4  CL 98* 93* 94*  CO2 26 24 24   GLUCOSE 195* 146* 140*  BUN 21* 56* 52*  CREATININE 5.11* 8.92* 7.15*  CALCIUM 7.6* 7.5* 7.7*  PHOS 4.5 4.7* 4.1   Recent Labs  Lab 07/18/17 2141 07/21/17 1449 07/23/17 1655  ALBUMIN 2.0* 1.9* 1.9*   Recent Labs  Lab 07/21/17 1449 07/23/17 1605  WBC 10.0 9.1  HGB 7.9* 7.7*  HCT 26.2* 25.3*  MCV 87.6 88.8  PLT 259 227   Iron/TIBC/Ferritin/ %Sat    Component Value Date/Time   IRON 56 07/20/2017 1106   TIBC 115 (L) 07/20/2017 1106   IRONPCTSAT 49 (H) 07/20/2017 1106

## 2017-07-26 ENCOUNTER — Inpatient Hospital Stay (HOSPITAL_COMMUNITY): Payer: Medicare Other | Admitting: Occupational Therapy

## 2017-07-26 ENCOUNTER — Inpatient Hospital Stay (HOSPITAL_COMMUNITY): Payer: Medicare Other | Admitting: Physical Therapy

## 2017-07-26 LAB — GLUCOSE, CAPILLARY
GLUCOSE-CAPILLARY: 158 mg/dL — AB (ref 65–99)
GLUCOSE-CAPILLARY: 128 mg/dL — AB (ref 65–99)
Glucose-Capillary: 125 mg/dL — ABNORMAL HIGH (ref 65–99)
Glucose-Capillary: 151 mg/dL — ABNORMAL HIGH (ref 65–99)

## 2017-07-26 NOTE — Progress Notes (Signed)
Occupational Therapy Session Note  Patient Details  Name: Charles Vasquez MRN: [id number] Date of Birth: 02/06/1936  Today's Date: 07/26/2017 OT Individual Time: 8315-1761 OT Individual Time Calculation (min): 26 min   Short Term Goals: Week 2:  OT Short Term Goal 1 (Week 2): Pt will continue working on downgraded min assist level goals for discharge.   Skilled Therapeutic Interventions/Progress Updates:    Pt greeted in w/c, agitated, initially refusing therapy due to not feeling well. OT returned later in day and pt was amenable to gentle UE exercises in room. With [facility] and demonstration cues, pt completed AAROM bilateral UEs including shoulders, utilized music for moving in beat to familiar songs. Pt c/o Lt shoulder soreness during exercises which reportedly improved after light massage to anterior aspect of Lt biceps. He completed bicep curls and forward rows using 1# bar x10 reps with tactile cues for strengthening. At end of tx pt was left in w/c, requesting for OT to keep music playing because it helped him feel better. Safety belt fastened and all needs within reach.   Therapy Documentation Precautions:  Precautions Precautions: Fall Restrictions Weight Bearing Restrictions: Yes RLE Weight Bearing: Non weight bearing Vital Signs: Therapy Vitals Pulse Rate: 77 Resp: 20 BP: (!) 109/52 Patient Position (if appropriate): Sitting Oxygen Therapy O2 Device: Room Air ADL: ADL ADL Comments: refer to functional navigator    See Function Navigator for Current Functional Status.   Therapy/Group: Individual Therapy  Raha Tennison A Philo Kurtz 07/26/2017, 3:47 PM

## 2017-07-26 NOTE — Progress Notes (Signed)
Physical Therapy Session Note  Patient Details  Name: Charles Vasquez MRN: [id number] Date of Birth: [birth year]  Today's Date: 07/26/2017 PT Individual Time: 0820-0915 PT Individual Time Calculation (min): 55 min   Short Term Goals: Week 2:  PT Short Term Goal 1 (Week 2): =LTG due to estimated LOS  Skilled Therapeutic Interventions/Progress Updates: Pt received supine in bed, denies pain but reports feeling "out of it" and lethargic at beginning of session. Rolling R/L S with bedrails while therapist donned brief totalA. Supine>sit minA. Squat pivot transfer to R side bed>w/c with minA. SetupA for breakfast d/t slow initiation of beginning to eat; therapist put salt/pepper/etc on food, cut pancakes to facilitate. W/c management to sink with S; performed hygiene at sink with increased time. RLE residual limb noted to be bleeding; shrinker sock removed and noted minimal amount of bleeding from lateral edge at incision; per RN ok to don clean shrinker with gauze over incision. Donned shrinker with donning ring to maintain gauze placement. Required R leg rest to be replaced d/t bleeding on towel. Remained seated in w/c at end of session, chair alarm and quick release belt intact, all needs in reach.      Therapy Documentation Precautions:  Precautions Precautions: Fall Restrictions Weight Bearing Restrictions: Yes RLE Weight Bearing: Non weight bearing   See Function Navigator for Current Functional Status.   Therapy/Group: Individual Therapy  Vista Lawman 07/26/2017, 9:36 AM

## 2017-07-26 NOTE — Progress Notes (Signed)
[place] PHYSICAL MEDICINE & REHABILITATION     PROGRESS NOTE  Subjective/Complaints:   Patient denies complaints.  He states that he slept well.  Objective: Vital Signs: Blood pressure (!) 109/47, pulse 80, temperature 98.8 F (37.1 C), temperature source Oral, resp. rate 14, weight 122 lb 2.2 oz (55.4 kg), SpO2 98 %.  Elderly male in no acute distress. HEENT exam atraumatic, normocephalic Neck is supple Chest clear to auscultation Cardiac exam S1 and S2 are regular Abdominal exam active bowel sounds, soft Extremities status post right BKA.  Assessment/Plan: 1. Functional deficits secondary to right BKA   Medical Problem List and Plan: 1.Decreased functional mobilitysecondary to right BKA 07/15/2017  Continue therapies, tent d/c 5/24  Awaiting clinical. 2. DVT Prophylaxis/Anticoagulation: No evidence of active bleeding. 3. Pain Management:He does not have complaints of pain at this time. 4. Mood:Provide emotional support 5. Neuropsych: . 6. Skin/Wound Care:Routine skin checks 7. Fluids/Electrolytes/Nutrition:Routine in and outs  8.Acute on chronic anemia. Continue Niferex  Hb 7.7 on 5/15  Cont to monitor 9.End-stage renal disease with hemodialysis. Follow-up renal services 10.Diabetes mellitus with peripheral neuropathy. Hemoglobin A1c 7.1.   Lantus 6 units with HD, increased to 8 units on 5/16  Lantus 4 units daily d/ced on 5/14   CBG (last 3)  Recent Labs    07/25/17 1920 07/26/17 0708 07/26/17 1140  GLUCAP 116* 125* 158*  Controlled 5/18 11.ID. Blood cultures Proteus/strep.   Rocephin course completed 5/16 12.Hypertension.   Fair control currently. Vitals:   07/25/17 1747 07/26/17 0519  BP: (!) 133/51 (!) 109/47  Pulse: 86 80  Resp: 18 14  Temp: 97.7 F (36.5 C) 98.8 F (37.1 C)  SpO2: 96% 98%   13.Constipation. Laxative assistance 14. Leukocytosis: Resolved    15.  Insomnia -patient self reports adequate sleep last  night. LOS (Days) 9 A FACE TO FACE EVALUATION WAS PERFORMED  Charles Vasquez 07/26/2017 12:27 PM

## 2017-07-27 ENCOUNTER — Inpatient Hospital Stay (HOSPITAL_COMMUNITY): Payer: Medicare Other | Admitting: Occupational Therapy

## 2017-07-27 LAB — GLUCOSE, CAPILLARY
GLUCOSE-CAPILLARY: 136 mg/dL — AB (ref 65–99)
GLUCOSE-CAPILLARY: 196 mg/dL — AB (ref 65–99)
GLUCOSE-CAPILLARY: 136 mg/dL — AB (ref 65–99)
Glucose-Capillary: 93 mg/dL (ref 65–99)

## 2017-07-27 NOTE — Progress Notes (Signed)
Occupational Therapy Session Note  Patient Details  Name: Charles Vasquez MRN: [id number] Date of Birth: 10-08-1935  Today's Date: 07/27/2017 OT Individual Time: 1440-1520 OT Individual Time Calculation (min): 40 min   Short Term Goals: Week 2:  OT Short Term Goal 1 (Week 2): Pt will continue working on downgraded min assist level goals for discharge.   Skilled Therapeutic Interventions/Progress Updates:    Pt seen for scheduled OT time and still reported not feeling well, in bed with girlfriend assisting him with self feeding. Refusing therapy. Once OT returned later in day, pt agreeable to tx. Squat pivot<w/c<drop arm commode completed with Mod A and max tactile instruction. Max multimodal cuing for laterally leaning to complete clothing mgt, as he would attempt to stand for therapist to complete. Pt unable to void in [facility], however had BM in brief. He laterally leaned towards Lt for OT to complete perihygiene. Pt reluctant to assist with elevating pants afterwards, stating "I can't do it" before trying with [facility] instruction, ultimately requiring Max A. Squat pivot>w/c completed with Mod A. After handwashing and lotion application w/c level at sink for Lt NMR, he self propelled down hallway 110 ft for UB strengthening. Mod vcs to avoid obstacles on Rt side. Pts functional performance today was impacted by visual deficits as well as increased confusion when compared to sessions with this therapist last weekend. Also more irritable and less attentive to tasks. RN staff made aware. Pt was left in w/c with all needs, chair alarm + safety belt, and girlfriend present. Played 60s greatest hits on room computer to improve affect.   Therapy Documentation Precautions:  Precautions Precautions: Fall Restrictions Weight Bearing Restrictions: Yes RLE Weight Bearing: Non weight bearing Vital Signs: Therapy Vitals Temp: 98.9 F (37.2 C) Temp Source: Oral Pulse Rate: 79 BP: 133/63 Patient Position (if  appropriate): Lying Oxygen Therapy SpO2: 96 % O2 Device: Room Air   ADL: ADL ADL Comments: refer to functional navigator    See Function Navigator for Current Functional Status.   Therapy/Group: Individual Therapy  Siyana Erney A Tifany Hirsch 07/27/2017, 4:01 PM

## 2017-07-27 NOTE — Progress Notes (Signed)
Charles Vasquez Progress Note  Subjective: no new issues seen in room  Vitals:   07/26/17 1502 07/26/17 2112 07/27/17 0547 07/27/17 1354  BP: (!) 109/52 (!) 119/59 (!) 108/42 133/63  Pulse: 77 74 75 79  Resp: 20 16 16    Temp:  97.7 F (36.5 C) 98.8 F (37.1 C) 98.9 F (37.2 C)  TempSrc:  Oral Oral Oral  SpO2:  100% 99% 96%  Weight:        Inpatient medications: . darbepoetin (ARANESP) injection - DIALYSIS  200 mcg Intravenous Q Mon-HD  . diclofenac sodium  2 g Topical QID  . dicyclomine  10 mg Oral BID WC  . doxercalciferol  2 mcg Intravenous Q M,W,F-HD  . heparin  5,000 Units Subcutaneous Q8H  . insulin aspart  0-9 Units Subcutaneous TID WC  . insulin glargine  8 Units Subcutaneous Q M,W,F-2000  . iron polysaccharides  150 mg Oral BID  . losartan  50 mg Oral Daily  . multivitamin  1 tablet Oral Daily  . pantoprazole  40 mg Oral BID WC  . sevelamer carbonate  1,600 mg Oral TID WC    acetaminophen **OR** acetaminophen, alum & mag hydroxide-simeth, calcium carbonate, loperamide, ondansetron **OR** ondansetron (ZOFRAN) IV, phenol, polyvinyl alcohol, senna-docusate, sevelamer carbonate, sorbitol, traMADol   Physical Exam Alert no distress No jvd Chest cta bilat RRR no mrg abd soft ntnd scaphoid Ext 1+edema L leg, sp R bka NF, ox 3   HD orders: [name] MWF 3hr  78kg  2/2.5 bath  350/600  Hep 2000 then 500 u/hr -Epogen 7200 unit IV TIW -Hectorol 2 mcg IV TIW       Impression: 1  S/P R BKA 07/15/17 (for diab foot wound) by [name] 2  Proteus/ strep bacteremia - completed course of rocephin IV 3  ESRD -MWF [name] 4  Anemia - HGB low 7.5- 8.5 here. Getting darbe 200 mcg IV q week, last dose 5/13 here. Cont oral Fe.  Tsat here was 49% on 5/12.  5  Secondary hyperparathyroidism - Last Ca 7.6 C Ca 9.2 Phos 4.5. Continue binders.  6  HTN - BP's high, resumed home losartan at 50 qd  7  Nutrition - Albumin 2.0. Renal/Carb mod diet. Add prostat,  renal vit.  8  DM-per primary 9  Volume - new dry wt around 56- 58kg (was 78kg?). Looks dry and wt's continue to drop will keep even on HD tomorrow.    Plan - dialysis Jed Limerick MD [facility] pager [phone]   07/27/2017, 3:43 PM   Recent Labs  Lab 07/21/17 1449 07/23/17 1655 07/25/17 1446  NA 131* 131* 133*  K 4.2 4.4 5.1  CL 93* 94* 94*  CO2 24 24 24   GLUCOSE 146* 140* 178*  BUN 56* 52* 50*  CREATININE 8.92* 7.15* 8.36*  CALCIUM 7.5* 7.7* 8.2*  PHOS 4.7* 4.1 4.9*   Recent Labs  Lab 07/21/17 1449 07/23/17 1655 07/25/17 1446  ALBUMIN 1.9* 1.9* 2.0*   Recent Labs  Lab 07/21/17 1449 07/23/17 1605 07/25/17 1446  WBC 10.0 9.1 30.4*  HGB 7.9* 7.7* 7.8*  HCT 26.2* 25.3* 25.1*  MCV 87.6 88.8 88.7  PLT 259 227 246   Iron/TIBC/Ferritin/ %Sat    Component Value Date/Time   IRON 56 07/20/2017 1106   TIBC 115 (L) 07/20/2017 1106   IRONPCTSAT 49 (H) 07/20/2017 1106

## 2017-07-27 NOTE — Progress Notes (Signed)
[place] PHYSICAL MEDICINE & REHABILITATION     PROGRESS NOTE  Subjective/Complaints:   Patient feels well.  Has no complaints.  He states his appetite is coming back and that rehab is going well.  Objective: Vital Signs: Blood pressure (!) 108/42, pulse 75, temperature 98.8 F (37.1 C), temperature source Oral, resp. rate 16, weight 122 lb 2.2 oz (55.4 kg), SpO2 99 %.  Elderly male in no acute distress.  HEENT exam: Atraumatic, edentulous Neck is supple Chest clear to auscultation Cardiac exam S1-S2 regular Abdominal exam active bowel sounds, soft Extremities without edema (right BKA). Assessment/Plan: 1. Functional deficits secondary to right BKA   Medical Problem List and Plan: 1.Decreased functional mobilitysecondary to right BKA 07/15/2017  Continue inpatient rehab.  Likely discharge later this week. 2. DVT Prophylaxis/Anticoagulation: No evidence of active bleeding. 3. Pain Management:He does not have complaints of pain at this time. 4. Mood:Provide emotional support 5. Neuropsych: . 6. Skin/Wound Care:Routine skin checks 7. Fluids/Electrolytes/Nutrition:Routine in and outs  8.Acute on chronic anemia.  CBC:    Component Value Date/Time   WBC 30.4 (H) 07/25/2017 1446   HGB 7.8 (L) 07/25/2017 1446   HGB 11.8 (L) 11/29/2011 0614   HCT 25.1 (L) 07/25/2017 1446   HCT 34.9 (L) 11/29/2011 0614   PLT 246 07/25/2017 1446   PLT 97 (L) 11/29/2011 0614   MCV 88.7 07/25/2017 1446   MCV 92 11/29/2011 0614   NEUTROABS 9.0 (H) 07/17/2017 0409   NEUTROABS 5.2 11/29/2011 0614   LYMPHSABS 0.9 07/17/2017 0409   LYMPHSABS 0.9 (L) 11/29/2011 0614   MONOABS 1.2 (H) 07/17/2017 0409   MONOABS 0.3 11/29/2011 0614   EOSABS 0.2 07/17/2017 0409   EOSABS 0.0 11/29/2011 0614   BASOSABS 0.0 07/17/2017 0409   BASOSABS 0.0 11/29/2011 0614   Anemia likely secondary to chronic disease (dialysis.).  We will continue to follow-up. 9.End-stage renal disease with hemodialysis.  Follow-up renal services 10.Diabetes mellitus with peripheral neuropathy.   CBG (last 3)  Recent Labs    07/26/17 1656 07/26/17 2142 07/27/17 0640  GLUCAP 151* 128* 93  Controlled Jul 27, 2017 11.ID. Blood cultures Proteus/strep.   Rocephin course completed 5/16 12.Hypertension.   Controlled.   Vitals:   07/26/17 2112 07/27/17 0547  BP: (!) 119/59 (!) 108/42  Pulse: 74 75  Resp: 16 16  Temp: 97.7 F (36.5 C) 98.8 F (37.1 C)  SpO2: 100% 99%   13.Constipation. Laxative assistance 14. Leukocytosis: Resolved    15.  Insomnia -patient self reports adequate sleep last night. LOS (Days) 10 A FACE TO FACE EVALUATION WAS PERFORMED  Bruce H Swords 07/27/2017 9:08 AM

## 2017-07-28 ENCOUNTER — Inpatient Hospital Stay (HOSPITAL_COMMUNITY): Payer: Medicare Other | Admitting: Physical Therapy

## 2017-07-28 ENCOUNTER — Inpatient Hospital Stay (HOSPITAL_COMMUNITY): Payer: Medicare Other | Admitting: Occupational Therapy

## 2017-07-28 ENCOUNTER — Inpatient Hospital Stay (HOSPITAL_COMMUNITY): Payer: Medicare Other

## 2017-07-28 LAB — GLUCOSE, CAPILLARY
GLUCOSE-CAPILLARY: 183 mg/dL — AB (ref 65–99)
GLUCOSE-CAPILLARY: 122 mg/dL — AB (ref 65–99)
Glucose-Capillary: 169 mg/dL — ABNORMAL HIGH (ref 65–99)
Glucose-Capillary: 182 mg/dL — ABNORMAL HIGH (ref 65–99)

## 2017-07-28 MED ORDER — HEPARIN SODIUM (PORCINE) 1000 UNIT/ML DIALYSIS
1750.0000 [IU] | INTRAMUSCULAR | Status: DC | PRN
  Administered 2017-07-28: 1800 [IU] via INTRAVENOUS_CENTRAL
  Filled 2017-07-28 (×2): qty 2

## 2017-07-28 MED ORDER — LIDOCAINE 5 % EX PTCH
1.0000 | MEDICATED_PATCH | CUTANEOUS | Status: DC
  Administered 2017-07-28 – 2017-07-29 (×2): 1 via TRANSDERMAL
  Filled 2017-07-28 (×3): qty 1

## 2017-07-28 NOTE — Progress Notes (Signed)
[facility] Progress Note  Subjective:  For HD today Still complains of shoulder pain  Requiring a lot of assist with PT Family thinks is getting depressed and neuropsyche to see this week per notes Looking for SNF in [name] area  Vitals:   07/27/17 1354 07/27/17 2121 07/28/17 0538 07/28/17 1355  BP: 133/63 120/60 (!) 128/57 (!) 141/49  Pulse: 79 72 73 69  Resp:  16 20   Temp: 98.9 F (37.2 C) 97.9 F (36.6 C) 98.9 F (37.2 C) 97.6 F (36.4 C)  TempSrc: Oral Oral Oral Oral  SpO2: 96% 100% 99%   Weight:       Physical Exam Alert no distress No jvd Lungs clear Regular S1S2 normal No S3 oer S4 Abd soft, NT Ext 1+edema LLE; R bka  Inpatient medications: . darbepoetin (ARANESP) injection - DIALYSIS  200 mcg Intravenous Q Mon-HD  . diclofenac sodium  2 g Topical QID  . dicyclomine  10 mg Oral BID WC  . doxercalciferol  2 mcg Intravenous Q M,W,F-HD  . heparin  5,000 Units Subcutaneous Q8H  . insulin aspart  0-9 Units Subcutaneous TID WC  . insulin glargine  8 Units Subcutaneous Q M,W,F-2000  . iron polysaccharides  150 mg Oral BID  . lidocaine  1 patch Transdermal Q24H  . losartan  50 mg Oral Daily  . multivitamin  1 tablet Oral Daily  . pantoprazole  40 mg Oral BID WC  . sevelamer carbonate  1,600 mg Oral TID WC    acetaminophen **OR** acetaminophen, alum & mag hydroxide-simeth, calcium carbonate, loperamide, ondansetron **OR** ondansetron (ZOFRAN) IV, phenol, polyvinyl alcohol, senna-docusate, sevelamer carbonate, sorbitol, traMADol   Recent Labs  Lab 07/23/17 1655 07/25/17 1446  NA 131* 133*  K 4.4 5.1  CL 94* 94*  CO2 24 24  GLUCOSE 140* 178*  BUN 52* 50*  CREATININE 7.15* 8.36*  CALCIUM 7.7* 8.2*  PHOS 4.1 4.9*   Recent Labs  Lab 07/23/17 1655 07/25/17 1446  ALBUMIN 1.9* 2.0*   Recent Labs  Lab 07/23/17 1605 07/25/17 1446  WBC 9.1 30.4*  HGB 7.7* 7.8*  HCT 25.3* 25.1*  MCV 88.8 88.7  PLT 227 246   Iron/TIBC/Ferritin/ %Sat     Component Value Date/Time   IRON 56 07/20/2017 1106   TIBC 115 (L) 07/20/2017 1106   IRONPCTSAT 49 (H) 07/20/2017 1106   HD orders: [facility] [name] MWF 3hr <MEASUREMGarloKindred Hospital BreanW<MEASUREMENT873-73<MEASUREY13m(615[facility] 6[place]<MEASUREMENGarloWalden Behavioral Care, LLCnW<MEASUREMENT251-82<MEASUREY65m(682[facility] 0[place]<MEASUREMENGarloConcord Ambulatory Surgery Center LLCnW<MEASUREMENT902-47<MEASUREY28m91[facility]-0[place]<MEASUREMENGarloStanton County HospitalnW<MEASUREMENT([phone])6<MEASUREY19m30[facility]-1[place]<MEASUREMENGarloGreater Sacramento Surgery CenternW<MEASUREMENT413 75<MEASUREY41m23[facility]-4[place]<MEASUREMENGarloLarkin Community HospitalnW<MEASUREMENT([phone])83<MEASUREY91m(470[facility] 3[place]<MEASUREMENGarloBeckley Arh HospitalnW<MEASUREMENT647-06<MEASUREY87m70[facility] 8[place]<MEASUREMENGarloSpringbrook HospitalnW<MEASUREMENT413-06<MEASUREY19m23[facility]-7[place] Kitc<MEASUREMENGarloStamford Asc LLCnW<MEASUREMENT651-73<MEASUREY84m35[facility] 5[place] Kit[place]<MEASUREMENGarloToms River Ambulatory Surgical CenternW<MEASUREMENT818-57<MEASUREY17m(66[facility])1[place]<MEASUREMENGarloUniversity Of Virginia Medical CenternW<MEASUREMENT514-46<MEASUREY64m87[facility]-0[place] Kitc<MEASUREMENGarloEl Camino Hospital Los GatosnW<MEASUREMENT778-15<MEASUREY67m71[facility]-2[place] Kitc<MEASUREMENGarloLegacy Mount Hood Medical CenternW<MEASUREMENT820-37<MEASUREY43m[facility]39[place] Kitc<MEASUREMENGarloFolsom Sierra Endoscopy CenternW<MEASUREMENT(954) 85<MEASUREY64mP[facility]14[place] Kit[place]<MEASUREMENGarloJackson Surgical Center LLCnW<MEASUREMENT412-08<MEASUREY66m34[facility]-4[place] Kitc<MEASUREMENGarloNebraska Surgery Center LLCnW<MEASUREMENT210-82<MEASUREY90m24[facility]-0[place] Kitc<MEASUREMENGarloWeymouth Endoscopy LLCnW<MEASUREMENT661-86<MEASUREY35m(20[facility])4[place]<MEASUREMENGarloThe Medical Center [facility]nW<MEASUREMENT(980) 73<MEASUREY8m21[facility]-3[place] Kitc<MEASUREMENGarloMedstar Medical Group [facility] LLCnW<MEASUREMENT810 6<MEASUREY33m(22[facility])8[place]<MEASUREMENGarloBoston Endoscopy Center LLCnW<MEASUREMENT406-62<MEASUREY40m98[facility]-7[place] Kitc<MEASUREMENGarloConnecticut Orthopaedic Specialists Outpatient Surgical [facility] LLCnW<MEASUREMENT(249) 54<MEASUREY56m62[facility]-6[place] Kit[place]<MEASUREMENGarloSpalding Endoscopy Center LLCnW<MEASUREMENT970-810<MEASUREY73m40[facility] 5[place] Kitc<MEASUREMENGarloCentral Vermont Medical CenternW<MEASUREMENT782-11<MEASUREY88m40[facility]-1[place] Kitc<MEASUREMENGarloHoly Cross HospitalnW<MEASUREMENT([phone])86<MEASUREY63m25[facility]-2[place] Kitc<MEASUREMENGarloHaven Behavioral Health Of Eastern PennsylvanianW<MEASUREMENT(563) 23<MEASUREY29m(743[facility] 6[place] Kitc<MEASUREMENGarloSt Francis Medical CenternW<MEASUREMENT(630) 05<MEASUREY56m[facility]12[place] Kitc<MEASUREMENGarloPaoli Surgery Center LPnW<MEASUREMENT402 62<MEASUREY73m(51[facility])7[place]<MEASUREMENGarloKalispell Regional Medical Center Inc Dba [facility] Outpatient CenternW<MEASUREMENT(347) 6<MEASUREY19m51[facility]-4[place]<MEASUREMENGarloWesterville Endoscopy Center LLCnW<MEASUREMENT650-56<MEASUREY26m71[facility]-1[place]<MEASUREMENGarloSt. Luke'S Methodist HospitalnW<MEASUREMENT5014<MEASUREY4m41[facility]-5[place] Kit[place]<MEASUREMENGarloMarian Regional Medical Center, Arroyo GrandenW<MEASUREMENT([phone])81<MEASUREY32m80[facility]-3[place] Kitchen84-0738 Kog2.5 bath  350/600  Hep 2000 then 500 u/hr -Epogen 7200 unit IV TIW -Hectorol 2 mcg IV TIW     Impression: 1. S/P R BKA 07/15/17 (for diab foot wound) by Dr. Chen. Rehab. SNF placement ultimately. 2. Proteus/ strep bacteremia - completed course of rocephin IV 3. ESRD -MWF [facility] [name] 4. Anemia - HGB low 7.5- 8.5 here. Getting darbe 200 mcg IV q week, last dose 5/13 here. Cont oral Fe.  Tsat here was 49% on 5/12.  5. Secondary hyperparathyroidism - Last Ca 7.6 C Ca 9.2 Phos 4.5. Continue binders.  6. HTN - BP's high, resumed home losartan at 50 qd  7. Nutrition - Renal/Carb mod diet. Added prostat, renal vit.  8. DM-per primary 9. Volume - new dry wt around 56- 58kg (was 78kg?). Looks dry and wt's continue to drop will keep even on HD today  Batya Citron, MD  [facility] [phone] Pager 07/28/2017, 4:19 PM

## 2017-07-28 NOTE — Progress Notes (Signed)
Physical Therapy Session Note  Patient Details  Name: MOSI MONTREUIL MRN: [id number] Date of Birth: 04/15/1935  Today's Date: 07/28/2017 PT Individual Time: 1130-1200 PT Individual Time Calculation (min): 30 min   Skilled Therapeutic Interventions/Progress Updates:    Pt very fatigued at start of session and often closing eyes throughout session stating he was worn out. Focused on simulated car transfer training with squat pivot technique in preparation for d/c with pt's brother in law observing. Educated on differences in real car transfer vs. Basic transfers and importance of clearance of bottom due to the gap. Instructed in w/c parts management to set up for transfers with max cues. Required mod assist for squat pivot in/out of car x 2 repetitions with verbal cues for hand placement and sequencing (pt wanting to keep LLE in the car instead of placing on floor to allow him to push through it). W/c mobility with BUE and LLE with supervision x 50' for functional UE strengthening and endurance and mobility training.  Therapy Documentation Precautions:  Precautions Precautions: Fall Restrictions Weight Bearing Restrictions: Yes RLE Weight Bearing: Non weight bearing   Pain: No complaints of pain, just fatigue.    See Function Navigator for Current Functional Status.   Therapy/Group: Individual Therapy  Karolee Stamps Darrol Poke, PT, DPT  07/28/2017, 12:16 PM

## 2017-07-28 NOTE — Progress Notes (Signed)
[place] PHYSICAL MEDICINE & REHABILITATION     PROGRESS NOTE  Subjective/Complaints:  Patient seen lying in bed this morning.  He states he slept well overnight.  He states he did not have a good weekend due to shoulder pain.  ROS: Denies CP, SOB, N/V/D  Objective: Vital Signs: Blood pressure (!) 128/57, pulse 73, temperature 98.9 F (37.2 C), temperature source Oral, resp. rate 20, weight 55.4 kg (122 lb 2.2 oz), SpO2 99 %. No results found. Recent Labs    07/25/17 1446  WBC 30.4*  HGB 7.8*  HCT 25.1*  PLT 246   Recent Labs    07/25/17 1446  NA 133*  K 5.1  CL 94*  GLUCOSE 178*  BUN 50*  CREATININE 8.36*  CALCIUM 8.2*   CBG (last 3)  Recent Labs    07/27/17 1643 07/27/17 2111 07/28/17 0635  GLUCAP 196* 136* 183*    Wt Readings from Last 3 Encounters:  07/25/17 55.4 kg (122 lb 2.2 oz)  07/17/17 55.4 kg (122 lb 2.2 oz)  07/11/17 59 kg (130 lb)    Physical Exam:  BP (!) 128/57 (BP Location: Left Arm)   Pulse 73   Temp 98.9 F (37.2 C) (Oral)   Resp 20   Wt 55.4 kg (122 lb 2.2 oz)   SpO2 99%   BMI 19.13 kg/m  Constitutional: No distress . Vital signs reviewed. HENT: Normocephalic.  Atraumatic. Eyes: EOMI. No discharge. Cardiovascular: RRR. No JVD. Respiratory: CTA Bilaterally.  Normal effort. GI: BS +. Non-distended. MSK: Minimal TTP in stump Right BKA edema and tenderness.  RUE: amputation of 2nd digit at PIP LLE: amputation digits  Neurological: He isalertand oriented x2, confused, stable. Motor: B/l UE, LLE, Right hip flexion 4+-5/5 proximal to distal  Skin: BKA with dressing c/d/i  Assessment/Plan: 1. Functional deficits secondary to right BKA which require 3+ hours per day of interdisciplinary therapy in a comprehensive inpatient rehab setting. Physiatrist is providing close team supervision and 24 hour management of active medical problems listed below. Physiatrist and rehab team continue to assess barriers to discharge/monitor patient  progress toward functional and medical goals.  Function:  Bathing Bathing position   Position: Shower  Bathing parts Body parts bathed by patient: Right arm, Left arm, Chest, Abdomen, Front perineal area, Right upper leg, Left upper leg, Buttocks Body parts bathed by helper: Left lower leg, Back, Buttocks  Bathing assist Assist Level: Touching or steadying assistance(Pt > 75%)      Upper Body Dressing/Undressing Upper body dressing   What is the patient wearing?: Pull over shirt/dress     Pull over shirt/dress - Perfomed by patient: Thread/unthread right sleeve, Thread/unthread left sleeve, Put head through opening, Pull shirt over trunk Pull over shirt/dress - Perfomed by helper: Thread/unthread right sleeve, Thread/unthread left sleeve, Put head through opening, Pull shirt over trunk        Upper body assist Assist Level: Supervision or verbal cues      Lower Body Dressing/Undressing Lower body dressing   What is the patient wearing?: Pants, Shoes, Socks     Pants- Performed by patient: Thread/unthread right pants leg, Thread/unthread left pants leg Pants- Performed by helper: Pull pants up/down   Non-skid slipper socks- Performed by helper: Don/doff left sock     Shoes - Performed by patient: Don/doff left shoe            Lower body assist Assist for lower body dressing: Touching or steadying assistance (Pt > 75%)  Toileting Toileting   Toileting steps completed by patient: Performs perineal hygiene Toileting steps completed by helper: Adjust clothing prior to toileting, Adjust clothing after toileting Toileting Assistive Devices: Grab bar or rail  Toileting assist Assist level: Touching or steadying assistance (Pt.75%)   Transfers Chair/bed transfer   Chair/bed transfer method: Squat pivot Chair/bed transfer assist level: Touching or steadying assistance (Pt > 75%) Chair/bed transfer assistive device: Armrests     Locomotion Ambulation Ambulation  activity did not occur: Safety/medical concerns         Wheelchair   Type: Manual Max wheelchair distance: 75 Assist Level: Supervision or verbal cues  Cognition Comprehension Comprehension assist level: Understands basic 90% of the time/cues < 10% of the time  Expression Expression assist level: Expresses basic 90% of the time/requires cueing < 10% of the time.  Social Interaction Social Interaction assist level: Interacts appropriately 90% of the time - Needs monitoring or encouragement for participation or interaction.  Problem Solving Problem solving assist level: Solves basic 90% of the time/requires cueing < 10% of the time  Memory Memory assist level: Recognizes or recalls 90% of the time/requires cueing < 10% of the time    Medical Problem List and Plan: 1.Decreased functional mobilitysecondary to right BKA 07/15/2017  Continue therapies  Shrinker ordered, not present, discussed with nursing regarding follow up 2. DVT Prophylaxis/Anticoagulation: Subcutaneous heparin. Monitor for any bleeding episodes 3. Pain Management:Ultram as needed  -may have left RTC tendonitis/bursitis   Left shoulder x-ray reviewed, relatively unremarkable shoulder joint   Voltaren gel ordered on 5/15 with good benefit   Lidoderm patch ordered on 5/20  -Mild right lower limb phantom pain   -Observe for now 4. Mood:Provide emotional support 5. Neuropsych: This patientis?fully capable of making decisions on hisown behalf. 6. Skin/Wound Care:Routine skin checks 7. Fluids/Electrolytes/Nutrition:Routine in and outs  8.Acute on chronic anemia. Continue Niferex  Hb 7.8 on 5/17  Cont to monitor 9.End-stage renal disease with hemodialysis. Follow-up renal services 10.Diabetes mellitus with peripheral neuropathy. Hemoglobin A1c 7.1.   Lantus 6 units with HD, increased to 8 units on 5/16  Lantus 4 units daily d/ced on 5/14   CBG (last 3)  Recent Labs    07/27/17 1643 07/27/17 2111  07/28/17 0635  GLUCAP 196* 136* 183*   Labile on 5/20  11.ID. Blood cultures Proteus/strep.   Rocephin course to be completed 5/16 12.Hypertension.   Losartan 25 started on 5/13, increased to 5/15  Relatively controlled on 5/20 Vitals:   07/27/17 2121 07/28/17 0538  BP: 120/60 (!) 128/57  Pulse: 72 73  Resp: 16 20  Temp: 97.9 F (36.6 C) 98.9 F (37.2 C)  SpO2: 100% 99%   13.Constipation. Laxative assistance 14. Leukocytosis:   WBCs 30.4 on 5/17, ? Erroneous  Afebrile   Labs with HD 15.  Insomnia - sleep graph to document pattern  LOS (Days) 11 A FACE TO FACE EVALUATION WAS PERFORMED  Andre Swander Karis Juba 07/28/2017 10:50 AM

## 2017-07-28 NOTE — NC FL2 (Signed)
[place] MEDICAID FL2 LEVEL OF CARE SCREENING TOOL     IDENTIFICATION  Patient Name: Charles Vasquez Birthdate: January 02, 1936 Sex: male Admission Date (Current Location): 07/17/2017  [facility] and [place] Number:  [employer] and Address:  [facility], [address], [place], [place] [address]      Provider Number: [id number]  Attending Physician Name and Address:  Marcello Fennel, MD  Relative Name and Phone Number:  [name]-sister [phone]    Current Level of Care: Other (Comment)(rehab) Recommended Level of Care:   Prior Approval Number:    Date Approved/Denied:   PASRR Number: [id number]  Discharge Plan: SNF    Current Diagnoses: Patient Active Problem List   Diagnosis Date Noted  . Chronic left shoulder pain   . Bacteremia   . Labile blood glucose   . Hypertensive crisis   . Hypoglycemia   . Leukocytosis   . Acute blood loss anemia   . Anemia of chronic disease   . ESRD on dialysis (HCC)   . Type 2 diabetes mellitus with peripheral neuropathy (HCC)   . Benign essential HTN   . 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 Pressure injury of skin 07/13/2017  . Diabetic foot infection (HCC) 07/12/2017  . CAD (coronary artery disease) 07/12/2017  . Type II diabetes mellitus with renal manifestations (HCC) 07/12/2017  . Wound of right foot   . Loose stools 05/20/2017  . Abdominal pain   . Loss of weight 03/18/2017  . Abnormal CT scan, colon 03/18/2017  . Melena 03/18/2017  . Periumbilical abdominal pain 03/18/2017  . ESRD (end stage renal disease) on dialysis (HCC) 10/22/2016  . Venous hypertension status post AVG procedure 08/10/2016  . Anasarca 08/04/2016  . Facial swelling 08/04/2016  . Effusion, other site 08/04/2016  . 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 Acute respiratory failure (HCC) 08/04/2016  . Hyperkalemia 08/04/2016  . Aftercare following surgery of the circulatory  system, NEC-Right  AVGG 09/25/2012  . Other complications due to renal dialysis device, implant, and graft 05/01/2012  . End stage renal disease (HCC) 03/17/2012  . FATIGUE, ACUTE 08/22/2008  . HEMATURIA UNSPECIFIED 07/11/2008  . Diabetes (HCC) 01/19/2008  . Essential hypertension 01/19/2008  . MYOCARDIAL INFARCTION, HX OF 01/19/2008  . GERD 01/19/2008  . OSTEOARTHRITIS 01/19/2008  . CEREBROVASCULAR ACCIDENT, HX OF 01/19/2008    Orientation RESPIRATION BLADDER Height & Weight     Self, Time, Situation, Place  Normal Continent Weight: 122 lb 2.2 oz (55.4 kg) Height:     BEHAVIORAL SYMPTOMS/MOOD NEUROLOGICAL BOWEL NUTRITION STATUS      Continent Diet(Renal Diet)  AMBULATORY STATUS COMMUNICATION OF NEEDS Skin   Extensive Assist Verbally Surgical wounds                       Personal Care Assistance Level of Assistance  Bathing, Dressing Bathing Assistance: Limited assistance   Dressing Assistance: Limited assistance     Functional Limitations Info             SPECIAL CARE FACTORS FREQUENCY  PT (By licensed PT), OT (By licensed OT), Diabetic urine testing   Diabetic Urine Testing Frequency: 4x daily PT Frequency: 5x week OT Frequency: 5x week            Contractures Contractures Info: Not present    Additional Factors Info  Code Status, Allergies, Insulin Sliding Scale Code Status Info: Full Code Allergies Info: lisinopril  Insulin Sliding Scale Info: controlled on his medications       Current Medications (07/28/2017):  This is the current hospital active medication list Current Facility-Administered Medications  Medication Dose Route Frequency Provider Last Rate Last Dose  . acetaminophen (TYLENOL) tablet 650 mg  650 mg Oral Q6H PRN Charlton Amor, PA-C   650 mg at 07/22/17 2207   Or  . acetaminophen (TYLENOL) suppository 650 mg  650 mg Rectal Q6H PRN Angiulli, Mcarthur Rossetti, PA-C      . alum & mag hydroxide-simeth (MAALOX/MYLANTA) 200-200-20 MG/5ML  suspension 15-30 mL  15-30 mL Oral Q2H PRN Charlton Amor, PA-C   30 mL at 07/27/17 0906  . calcium carbonate (TUMS - dosed in mg elemental calcium) chewable tablet 200 mg of elemental calcium  1 tablet Oral Daily PRN Angiulli, Mcarthur Rossetti, PA-C      . Darbepoetin Alfa (ARANESP) injection 200 mcg  200 mcg Intravenous Q Mon-HD Pola Corn, NP   200 mcg at 07/21/17 1634  . diclofenac sodium (VOLTAREN) 1 % transdermal gel 2 g  2 g Topical QID Marcello Fennel, MD   2 g at 07/28/17 0815  . dicyclomine (BENTYL) capsule 10 mg  10 mg Oral BID WC AngiulliMcarthur Rossetti, PA-C   10 mg at 07/28/17 1610  . doxercalciferol (HECTOROL) injection 2 mcg  2 mcg Intravenous Q M,W,F-HD Pola Corn, NP   2 mcg at 07/23/17 1900  . heparin injection 5,000 Units  5,000 Units Subcutaneous Q8H Charlton Amor, PA-C   5,000 Units at 07/28/17 9604  . insulin aspart (novoLOG) injection 0-9 Units  0-9 Units Subcutaneous TID WC Charlton Amor, PA-C   2 Units at 07/28/17 5409  . insulin glargine (LANTUS) injection 8 Units  8 Units Subcutaneous Q M,W,F-2000 Marcello Fennel, MD   8 Units at 07/25/17 2138  . iron polysaccharides (NIFEREX) capsule 150 mg  150 mg Oral BID Charlton Amor, PA-C   150 mg at 07/28/17 8119  . lidocaine (LIDODERM) 5 % 1 patch  1 patch Transdermal Q24H Marcello Fennel, MD      . loperamide (IMODIUM) capsule 2 mg  2 mg Oral Q8H PRN Angiulli, Mcarthur Rossetti, PA-C      . losartan (COZAAR) tablet 50 mg  50 mg Oral Daily Delano Metz, MD   50 mg at 07/28/17 1478  . multivitamin (RENA-VIT) tablet 1 tablet  1 tablet Oral Daily Charlton Amor, PA-C   1 tablet at 07/28/17 2956  . ondansetron (ZOFRAN) tablet 4 mg  4 mg Oral Q6H PRN Charlton Amor, PA-C   4 mg at 07/27/17 2130   Or  . ondansetron (ZOFRAN) injection 4 mg  4 mg Intravenous Q6H PRN Charlton Amor, PA-C   4 mg at 07/20/17 0844  . pantoprazole (PROTONIX) EC tablet 40 mg  40 mg Oral BID WC AngiulliMcarthur Rossetti, PA-C   40  mg at 07/28/17 8657  . phenol (CHLORASEPTIC) mouth spray 1 spray  1 spray Mouth/Throat PRN Angiulli, Mcarthur Rossetti, PA-C      . polyvinyl alcohol (LIQUIFILM TEARS) 1.4 % ophthalmic solution 1 drop  1 drop Both Eyes TID PRN Angiulli, Mcarthur Rossetti, PA-C      . senna-docusate (Senokot-S) tablet 1 tablet  1 tablet Oral QHS PRN Angiulli, Mcarthur Rossetti, PA-C      . sevelamer carbonate (RENVELA) tablet 1,600 mg  1,600 mg Oral TID WC Angiulli, Daniel J, PA-C   1,600 mg  at 07/28/17 0811  . sevelamer carbonate (RENVELA) tablet 800 mg  800 mg Oral PRN Marcello Fennel, MD      . sorbitol 70 % solution 30 mL  30 mL Oral Daily PRN Angiulli, Mcarthur Rossetti, PA-C      . traMADol Janean Sark) tablet 50 mg  50 mg Oral Q6H PRN AngiulliMcarthur Rossetti, PA-C   50 mg at 07/28/17 1103     Discharge Medications: Please see discharge summary for a list of discharge medications.  Relevant Imaging Results:  Relevant Lab Results:   Additional Information SSN: [id number] HD M-W-F Chair time 6:00 am, takes trasnport there  Anja Neuzil, Lemar Livings, LCSW

## 2017-07-28 NOTE — Progress Notes (Signed)
Physical Therapy Session Note  Patient Details  Name: Charles Vasquez MRN: [id number] Date of Birth: 1936/02/08  Today's Date: 07/28/2017 PT Individual Time: 0800-0830 PT Individual Time Calculation (min): 30 min   Short Term Goals: Week 2:  PT Short Term Goal 1 (Week 2): =LTG due to estimated LOS  Skilled Therapeutic Interventions/Progress Updates: Pt presented in bed agreeable to therapy. Pt c/o L shoulder pain no numerical assessment however states "it hurt real bad". PTA performed grade I-II distraction, A-P grade II glides and cross friction massage of bicep tendon. Nsg arrived to administer meds and adv will apply topical pain run after session. Pt performed supine to sit with min guard and use of features. Pt participated in sit to/from stand x 3 from bed, initially requiring mod/maxA with max verbal cues for hand placement and sequencing. Pt noted to require less verbal cues with repetition. In standing pt required tactile cues for postural correction. Pt performed squat pivot to w/c with min guard and pt remained in w/c at end of session with call bell within reach and needs met.      Therapy Documentation Precautions:  Precautions Precautions: Fall Restrictions Weight Bearing Restrictions: Yes RLE Weight Bearing: Non weight bearing General:   Vital Signs:  Pain: Pain Assessment Pain Scale: 0-10 Pain Score: 8  Pain Type: Chronic pain Pain Location: Shoulder Pain Orientation: Left Pain Descriptors / Indicators: Aching Pain Frequency: Intermittent Pain Onset: On-going Patients Stated Pain Goal: 2 Pain Intervention(s): Medication (See eMAR)  See Function Navigator for Current Functional Status.   Therapy/Group: Individual Therapy  Jashun Puertas  Arnol Mcgibbon, PTA  07/28/2017, 12:23 PM

## 2017-07-28 NOTE — Progress Notes (Signed)
Physical Therapy Session Note  Patient Details  Name: Charles Vasquez MRN: [id number] Date of Birth: [birth year]  Today's Date: 07/28/2017 PT Individual Time: 0900-1000 PT Individual Time Calculation (min): 60 min   Short Term Goals: Week 2:  PT Short Term Goal 1 (Week 2): =LTG due to estimated LOS  Skilled Therapeutic Interventions/Progress Updates: Pt received seated in w/c, c/o headache and requesting pain medication; agreeable to treatment. Pt appears discouraged, saying he "felt bad" all day yesterday and just "doesn't feel himself". Squat pivot transfer w/c<>BSC over toilet with modA. Pt performs partial sit <>stand using grab bars for therapist to doff/don pants and brief. Requires several minutes sitting on toilet to void. Pt performs hygiene with setupA. Hygiene at sink with setupA. Alerted RN to request of pain medication. W/c propulsion x75' with BUE until limited by L shoulder pain. Transfer w/c >mat with S; educated pt and family on why transfer to bed/toilet/couch etc is more challenging and requires more assist as surfaces are more compliant or w/c cannot be setup as close. Sit <>stand x2 reps from mat table with RW and minA; pt unable to complete goal of 5 reps d/t fatigue. Sit >supine with S. PROM shoulder flexion/abduction/internal/external rotation and soft tissue massage to pecs, lats, subscapularis with palpable trigger points throughout. Demo's abduction PROM to 95 degrees, shoulder ER 25 degrees, flexion 110 degrees all significantly improving. Returned to w/c minA squat pivot; transported to room totalA. Remained seated in w/c with quick release belt intact and family present, all needs in reach.      Therapy Documentation Precautions:  Precautions Precautions: Fall Restrictions Weight Bearing Restrictions: Yes RLE Weight Bearing: Non weight bearing  See Function Navigator for Current Functional Status.   Therapy/Group: Individual Therapy  Vista Lawman 07/28/2017, 10:18 AM

## 2017-07-28 NOTE — Progress Notes (Signed)
Social Work Patient ID: Delma Post, male   DOB: 1936-01-11, 82 y.o.   MRN: [id number]  Met with sister who feels pt is becoming depressed. She feels it is a combination of things the loss of his leg, being far from home and the pain he is experiencing. Discussed the normal grief from the loss he has experienced and will ask neuro-psych to see again this week. Discussed will start FL2 and begin looking for NH bed in the [place] area, closer to home. Pt reports he has had more pain and doesn't see any progress in therapies ike he thought he would. Will work toward discharge and find NH bed.

## 2017-07-28 NOTE — Progress Notes (Signed)
Occupational Therapy Session Note  Patient Details  Name: Charles Vasquez MRN: [id number] Date of Birth: 02/12/1936  Today's Date: 07/28/2017 OT Individual Time: 1001-1101 OT Individual Time Calculation (min): 60 min    Short Term Goals: Week 2:  OT Short Term Goal 1 (Week 2): Pt will continue working on downgraded min assist level goals for discharge.   Skilled Therapeutic Interventions/Progress Updates:    Pt completed bathing and dressing during session.  Max assist for squat pivot transfer to the tub bench from the wheelchair.  Pt with increased difficulty pivoting on the LLE to complete transfer.  Once on the seat, he was able to complete UB bathing with supervision and LB bathing with min assist, using lateral leans to wash peri area.  Mod demonstrational cueing for sequencing using more soap and adding water to the washcloth.  He was able to transfer max assist to the wheelchair for work on dressing at the sink.  Increased difficulty threading both LEs into the brief and the pants.  Pt overall needing max assist to complete, along with max assist to stand and pull them up over his hips.  Increased LOB to the right and posteriorly when completing standing.  Max assist for donning gripper sock on the right foot as well as tied shoe with use of the shoe funnel.  Pt's sister present for session.  Pt left in chair with call button and phone in reach.    Therapy Documentation Precautions:  Precautions Precautions: Fall Restrictions Weight Bearing Restrictions: Yes RLE Weight Bearing: Non weight bearing  Pain: Pain Assessment Pain Scale: 0-10 Pain Score: 8  Pain Type: Chronic pain Pain Location: Shoulder Pain Orientation: Left Pain Descriptors / Indicators: Aching Pain Frequency: Intermittent Pain Onset: On-going Patients Stated Pain Goal: 2 Pain Intervention(s): Medication (See eMAR) ADL: See Function Navigator for Current Functional Status.   Therapy/Group: Individual  Therapy  Brittanya Winburn OTR/L 07/28/2017, 12:09 PM

## 2017-07-29 ENCOUNTER — Inpatient Hospital Stay (HOSPITAL_COMMUNITY): Payer: Medicare Other | Admitting: Occupational Therapy

## 2017-07-29 ENCOUNTER — Inpatient Hospital Stay (HOSPITAL_COMMUNITY): Payer: Medicare Other | Admitting: Physical Therapy

## 2017-07-29 ENCOUNTER — Encounter (HOSPITAL_COMMUNITY): Payer: Medicare Other | Admitting: Psychology

## 2017-07-29 LAB — GLUCOSE, CAPILLARY
Glucose-Capillary: 95 mg/dL (ref 65–99)
Glucose-Capillary: 114 mg/dL — ABNORMAL HIGH (ref 65–99)
Glucose-Capillary: 179 mg/dL — ABNORMAL HIGH (ref 65–99)
Glucose-Capillary: 216 mg/dL — ABNORMAL HIGH (ref 65–99)

## 2017-07-29 LAB — RENAL FUNCTION PANEL
Albumin: 1.9 g/dL — ABNORMAL LOW (ref 3.5–5.0)
Anion gap: 16 — ABNORMAL HIGH (ref 5–15)
BUN: 82 mg/dL — AB (ref 6–20)
CHLORIDE: 92 mmol/L — AB (ref 101–111)
CO2: 22 mmol/L (ref 22–32)
Calcium: 7.7 mg/dL — ABNORMAL LOW (ref 8.9–10.3)
Creatinine, Ser: 9.73 mg/dL — ABNORMAL HIGH (ref 0.61–1.24)
GFR calc Af Amer: 5 mL/min — ABNORMAL LOW (ref >60)
GFR calc non Af Amer: 4 mL/min — ABNORMAL LOW (ref >60)
GLUCOSE: 99 mg/dL (ref 65–99)
Phosphorus: 5.2 mg/dL — ABNORMAL HIGH (ref 2.5–4.6)
Potassium: 4.5 mmol/L (ref 3.5–5.1)
Sodium: 130 mmol/L — ABNORMAL LOW (ref 135–145)

## 2017-07-29 LAB — CBC
HEMATOCRIT: 23 % — AB (ref 39.0–52.0)
Hemoglobin: 7 g/dL — ABNORMAL LOW (ref 13.0–17.0)
MCH: 26.3 pg (ref 26.0–34.0)
MCHC: 30.4 g/dL (ref 30.0–36.0)
MCV: 86.5 fL (ref 78.0–100.0)
Platelets: 237 10*3/uL (ref 150–400)
RBC: 2.66 MIL/uL — ABNORMAL LOW (ref 4.22–5.81)
RDW: 15.5 % (ref 11.5–15.5)
WBC: 9.9 10*3/uL (ref 4.0–10.5)

## 2017-07-29 MED ORDER — PRO-STAT SUGAR FREE PO LIQD
30.0000 mL | Freq: Two times a day (BID) | ORAL | Status: DC
  Administered 2017-07-29 – 2017-07-31 (×5): 30 mL via ORAL
  Filled 2017-07-29 (×5): qty 30

## 2017-07-29 MED ORDER — DARBEPOETIN ALFA 200 MCG/0.4ML IJ SOSY
PREFILLED_SYRINGE | INTRAMUSCULAR | Status: AC
  Administered 2017-07-29: 200 ug via INTRAVENOUS
  Filled 2017-07-29: qty 0.4

## 2017-07-29 MED ORDER — NEPRO/CARBSTEADY PO LIQD
237.0000 mL | Freq: Two times a day (BID) | ORAL | Status: DC
  Administered 2017-07-29 – 2017-07-31 (×3): 237 mL via ORAL

## 2017-07-29 MED ORDER — DOXERCALCIFEROL 4 MCG/2ML IV SOLN
INTRAVENOUS | Status: AC
  Administered 2017-07-29: 2 ug via INTRAVENOUS
  Filled 2017-07-29: qty 2

## 2017-07-29 NOTE — Progress Notes (Signed)
Occupational Therapy Session Note  Patient Details  Name: Charles Vasquez MRN: [id number] Date of Birth: [birth year]  Today's Date: 07/29/2017 OT Individual Time: 1000-1100 OT Individual Time Calculation (min): 60 min    Short Term Goals: Week 1:  OT Short Term Goal 1 (Week 1): Pt will be able to transfer on and off a toilet with mod A. OT Short Term Goal 1 - Progress (Week 1): Met OT Short Term Goal 2 (Week 1): Pt will be able to consistently sit to stand at sink with min A. OT Short Term Goal 2 - Progress (Week 1): Not met OT Short Term Goal 3 (Week 1): Pt will be able to don shirt with mod A or less. OT Short Term Goal 3 - Progress (Week 1): Met OT Short Term Goal 4 (Week 1): Pt will be able to don pants over feet with min A. OT Short Term Goal 4 - Progress (Week 1): Met OT Short Term Goal 5 (Week 1): Pt will bathe with min A. OT Short Term Goal 5 - Progress (Week 1): Met Week 2:  OT Short Term Goal 1 (Week 2): Pt will continue working on downgraded min assist level goals for discharge.       Skilled Therapeutic Interventions/Progress Updates:    Pt received in w/c and discussed therapy plan for this session. Pt did not recognize this therapist who completed his evaluation.  Pt declined a shower today  But was agreeable to freshening up at the sink and working on transfers. After grooming and UB B/d at sink, pt completed a squat pivot w/c to drop arm BSC over toilet with min A using grab bars.   Pt stated he was constipated and not need to toilet but encouraged to sit for a few minutes.  He began using lateral leans to doff pants, but then needed them doffed immediately as he felt he was having a BM. Pt was able to have a bowel movement. He sat for several minutes to ensure he had completed BM.  Pt cleansed self with lateral leans. Pt was able to don clean pants over feet and used lateral leans to partially pull pants over hips. He was having difficulty as pants kept getting caught on rim  of seat. Pt completed sit to stand with bar with min A for therapist to A pull pants.  The return transfer to the LEFT to W/c was much more difficult as pt could not use LUE well.  His hips were not able to elevate enough and kept getting stuck on edge of cushion and pt was having difficulty with full sit to stand for the 3rd time so he needed MAX A to squat pivot back to w/c to L side.   Pt resting in w/c with QRB on and chair alarm on.    Therapy Documentation Precautions:  Precautions Precautions: Fall Restrictions Weight Bearing Restrictions: Yes RLE Weight Bearing: Non weight bearing  Pain: Pain Assessment Pain Scale: 0-10 Pain Score: 7  Pain Type: Chronic pain Pain Location: Shoulder Pain Orientation: Left Pain Descriptors / Indicators: Aching Patients Stated Pain Goal: 2 Pain Intervention(s): Medication (See eMAR) ADL: ADL ADL Comments: refer to functional navigator  See Function Navigator for Current Functional Status.   Therapy/Group: Individual Therapy  [employer] 07/29/2017, 10:30 AM

## 2017-07-29 NOTE — Progress Notes (Signed)
Received a call from dialysis nurse that patient Hgb is 7.0, on-call notified but no new order given. We continue to monitor.

## 2017-07-29 NOTE — Progress Notes (Signed)
HD tx completed @ 5465 w/o problem, UF goal of keep even met, blood rinsed back, VSS, report called to Suella Grove, RN

## 2017-07-29 NOTE — Progress Notes (Signed)
HD tx initiated via 15Gx2 w/o problem, pull/push/flush well, VSS, will cont to monitor while on HD tx 

## 2017-07-29 NOTE — Consult Note (Signed)
Neuropsychological Consultation   Patient:   Charles Vasquez   DOB:   01-26-1936  MR Number:  [id number]  Location:  [facility] [address] [facility] [address] [id number] [place] [address] Dept: [phone] Loc: [phone]           Date of Service:   07/29/2017  Start Time:   1 PM End Time:   2 PM  Provider/Observer:  Arley Phenix, Psy.D.       Clinical Neuropsychologist       Billing Code/Service: 44010 4 Units  Chief Complaint:    Charles Vasquez is a 82 year old male with history of hypertension, CAD/MI ESRD with hemodialysis and diabetes mellitus.  Patient does have problem list descriptions of prior stroke and hear attack.  Presented 07/12/2017 with chronic right foot wound infection and progressive ischemic changes.  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  Patient admitted for CIR program.  Patient has some confusion about his care regarding dialysis and understanding issues with phantom limb symptoms.  The above chief complaint remains consistent and has been reviewed.  The patient does report on this visit that he is having less phantom limb type of symptoms.  The patient reports that he has been more frustrated and fatigued but denies any significant symptoms of depression.  Reason for Service:  Charles Vasquez was referred for neuropsychological consultation due to coping and adjustment issues following BKA.  Below is the HPI for the current admission.  Charles Vasquez is a 82 year old right-handed male with history of hypertension, CAD/MI end-stage renal disease with hemodialysis Monday Wednesdays and Fridays at [facility] dialysis center in [facility], diabetes mellitus. Patient lives alone. He has a girlfriend that checks on him daily and a sister in the area. One level home. Resented 07/12/2017 with chronic right wound infection progressive ischemic changes. Patient with recent right  lower extremity angiogram with attempted revascularization of right posterior vertebral artery was unsuccessful 5 weeks ago. 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.Hospital course pain management. Hemodialysis ongoing as per renal services. Subcutaneous heparin for DVT prophylaxis. Acute on chronic anemia 8.9 and received 2 units packed red blood cells and monitored. Blood cultures with Proteus/strep currently maintained on Rocephin. Follow-up of left lower extremity arterial duplex completed showing mild plaque noted throughout no evidence of significant stenosis. Physical and occupational therapy evaluations completed with recommendations of physical medicine rehab consult. Patient was admitted for a comprehensive rehab program  Current Status:  The patient appeared even more clear today and his mental status was good.  The patient was able to remember going to dialysis this morning as well as remembering the meals that he is eaten recently.  The patient remembered 3 of 3 objects.  The patient was oriented to time as well.  The patient was able to do some verbal reasoning challenges as well.  There was report from the patient's sister that she had been concerned that the patient was getting depressed.  This issue was reviewed with the patient directly today.  The patient denied any significant symptoms of depression but did acknowledge that he has been more frustrated and fatigued.  The patient reports that it is stressful for him to be here at the hospital but he understands he needs to do this.  The patient is hoping to be able to get a prostatic and knows that he needs  to work on his physical stamina and motor functioning.    Behavioral Observation: Charles Vasquez  presents as a 82 y.o.-year-old Right African American Male who appeared his stated age. his dress was Appropriate and he was Well Groomed and his manners were Appropriate to the situation.  his  participation was indicative of Appropriate and Redirectable behaviors.  There were any physical disabilities noted.  he displayed an appropriate level of cooperation and motivation.     Interactions:    Active Appropriate and Redirectable  Attention:   abnormal and attention span appeared shorter than expected for age but was improved today and the patient was more alert and oriented.  Memory:   abnormal; remote memory intact, recent memory impaired  Visuo-spatial:  within normal limits  Speech (Volume):  low  Speech:   slurred;    Thought Process:  Coherent and Circumstantial  Though Content:  WNL; not suicidal and not homicidal  Orientation:   person, place, time/date and situation  Judgment:   Fair  Planning:   Fair  Affect:    Appropriate  Mood:    Dysphoric and Euthymic  Insight:   Fair  Intelligence:   low  Marital Status/Living: Patient has a girlfriend but does not live with her although she checks on him every day.  Medical History:   Past Medical History:  Diagnosis Date  . Anal pain   . AVF (arteriovenous fistula) (HCC) 05-13-2017 per pt currently AVF access used is left thigh   hx multiple AVF surgery's (previously left upper arm, bilateral thigh) last surgery -- right upper arm creation 11-26-2016  . ESRD (end stage renal disease) on dialysis [facility])    [facility] in [place], [place] on MWF   . History of CVA (cerebrovascular accident)    05-13-2017  per pt stated "thats what they told yrs ago"  per pt no residual  . Hyperlipidemia   . Hypertension   . Myocardial infarction (HCC)   . Stroke (HCC)   . Type 2 diabetes mellitus treated with insulin (HCC)    Psychiatric History:  No prior history of   Family Med/Psych History:  Family History  Problem Relation Age of Onset  . Diabetes Mother   . Hypertension Mother   . AAA (abdominal aortic aneurysm) Mother   . Diabetes Father   . Hypertension Father     Impression/DX:  Charles Vasquez is a 82  year old male with history of hypertension, CAD/MI ESRD with hemodialysis and diabetes mellitus.  Patient does have problem list descriptions of prior stroke and hear attack.  Presented 07/12/2017 with chronic right foot wound infection and progressive ischemic changes.  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  Patient admitted for CIR program.  Patient has some confusion about his care regarding dialysis and understanding issues with phantom limb symptoms.    The patient appeared even more clear today and his mental status was good.  The patient was able to remember going to dialysis this morning as well as remembering the meals that he is eaten recently.  The patient remembered 3 of 3 objects.  The patient was oriented to time as well.  The patient was able to do some verbal reasoning challenges as well.  There was report from the patient's sister that she had been concerned that the patient was getting depressed.  This issue was reviewed with the patient directly today.  The patient denied any significant symptoms of depression but did acknowledge  that he has been more frustrated and fatigued.  The patient reports that it is stressful for him to be here at the hospital but he understands he needs to do this.  The patient is hoping to be able to get a prostatic and knows that he needs to work on his physical stamina and motor functioning.        Electronically Signed   _______________________ Arley Phenix, Psy.D.

## 2017-07-29 NOTE — Progress Notes (Signed)
Occupational Therapy Session Note  Patient Details  Name: Charles Vasquez MRN: [id number] Date of Birth: 1935-04-20  Today's Date: 07/29/2017 OT Individual Time: 1400-1447 OT Individual Time Calculation (min): 47 min    Short Term Goals: Week 2:  OT Short Term Goal 1 (Week 2): Pt will continue working on downgraded min assist level goals for discharge.   Skilled Therapeutic Interventions/Progress Updates:    Pt in wheelchair at start of session.  Therapist pushed pt down to the therapy gym for session and then completed squat pivot transfer to the therapy mat with mod assist.  Once on the mat had pt transition to supine with supervision.  Applied moist heat to the left shoulder for 8 mins without any adverse reactions.  After completion of heat, therapist complete left scapular mobilizations in sidelying and glenohumeral mobilizations in supine.  Therapist performed pectoral stretches and shoulder flexion with external rotation of the humerus.  Pt with increased difficulty relaxing and letting therapist perform passive motion of the shoulder with any movements.  Had him completed BUE shoulder flexion while holding ball in supine.  Mod facilitation to completed 10 repetitions of partial shoulder flexion to 90 degrees.  Transitioned to sitting and worked on scapular retraction exercises.  Max facilitation for 10 repetitions.  Pt returned to the room via wheelchair at end of session.  Call button in reach, safety belt in place and pt left in the wheelchair eating lunch.    Therapy Documentation Precautions:  Precautions Precautions: Fall Restrictions Weight Bearing Restrictions: Yes RLE Weight Bearing: Weight bearing as tolerated  Pain: Pain Assessment Pain Scale: Faces Pain Type: Chronic pain Pain Location: Arm Pain Orientation: Left Pain Descriptors / Indicators: Discomfort Pain Onset: With Activity Pain Intervention(s): Repositioned ADL: ADL ADL Comments: refer to functional  navigator See Function Navigator for Current Functional Status.   Therapy/Group: Individual Therapy  Tyeesha Riker OTR/L 07/29/2017, 4:03 PM

## 2017-07-29 NOTE — Progress Notes (Signed)
[facility] Progress Note  Subjective:  Finished HD in wee hours this AM Requiring a lot of assist with PT Really tired this AM Family thinks is getting depressed and neuropsyche to see this week per notes Looking for SNF in [place] area  Vitals:   07/29/17 0230 07/29/17 0250 07/29/17 0317 07/29/17 0348  BP: 132/64 138/62 (!) 145/66 (!) 143/62  Pulse: 67 67 68 72  Resp: 18 14 17 20   Temp:   98 F (36.7 C) 97.9 F (36.6 C)  TempSrc:   Oral Oral  SpO2: 98% 98% 97% 96%  Weight:   57.2 kg (126 lb 1.7 oz)    Physical Exam Alert no distress No jvd Lungs clear Regular S1S2 normal No S3 oer S4 Abd soft, NT Ext trace  LLE; R BKA  Inpatient medications: . darbepoetin (ARANESP) injection - DIALYSIS  200 mcg Intravenous Q Mon-HD  . diclofenac sodium  2 g Topical QID  . dicyclomine  10 mg Oral BID WC  . doxercalciferol  2 mcg Intravenous Q M,W,F-HD  . heparin  5,000 Units Subcutaneous Q8H  . insulin aspart  0-9 Units Subcutaneous TID WC  . insulin glargine  8 Units Subcutaneous Q M,W,F-2000  . iron polysaccharides  150 mg Oral BID  . lidocaine  1 patch Transdermal Q24H  . losartan  50 mg Oral Daily  . multivitamin  1 tablet Oral Daily  . pantoprazole  40 mg Oral BID WC  . sevelamer carbonate  1,600 mg Oral TID WC    acetaminophen **OR** acetaminophen, alum & mag hydroxide-simeth, calcium carbonate, heparin, loperamide, ondansetron **OR** ondansetron (ZOFRAN) IV, phenol, polyvinyl alcohol, senna-docusate, sevelamer carbonate, sorbitol, traMADol   Recent Labs  Lab 07/23/17 1655 07/25/17 1446 07/28/17 2357  NA 131* 133* 130*  K 4.4 5.1 4.5  CL 94* 94* 92*  CO2 24 24 22   GLUCOSE 140* 178* 99  BUN 52* 50* 82*  CREATININE 7.15* 8.36* 9.73*  CALCIUM 7.7* 8.2* 7.7*  PHOS 4.1 4.9* 5.2*   Recent Labs  Lab 07/23/17 1655 07/25/17 1446 07/28/17 2357  ALBUMIN 1.9* 2.0* 1.9*   Recent Labs  Lab 07/23/17 1605 07/25/17 1446 07/28/17 2357  WBC 9.1 30.4* 9.9   HGB 7.7* 7.8* 7.0*  HCT 25.3* 25.1* 23.0*  MCV 88.8 88.7 86.5  PLT 227 246 237   Iron/TIBC/Ferritin/ %Sat    Component Value Date/Time   IRON 56 07/20/2017 1106   TIBC 115 (L) 07/20/2017 1106   IRONPCTSAT 49 (H) 07/20/2017 1106   HD orders: [name] MWF 3hr  78kg  2/2.5 bath  350/600  Hep 2000 then 500 u/hr -Epogen 7200 unit IV TIW -Hectorol 2 mcg IV TIW     Impression: 1. S/P R BKA 07/15/17 (diab foot wound) by [name]. Rehab. SNF placement ultimately. 2. Proteus/ strep bacteremia - completed course of rocephin IV 3. ESRD -MWF [name]. No fluid off with HD yesterday, pre and post weights same.  (goal was keep even). New dry wt around 57 kg 4. Anemia - Getting darbe 200 mcg IV q week, last dosed 5/21 200 mcg.  Cont oral Fe.  Tsat 49% on 5/12. Hb down to 7 pre HD yesterday. Plan to transfuse with next HD if <7.5 5. Secondary hyperparathyroidism - Last Ca 7.6 C Ca 9.2 Phos 4.5. Continue binders (sevelamer). Hectorol with HD for PTH.  6. HTN -  losartan 50 qd  7. Nutrition - Renal/Carb mod diet. Added prostat, renal vit.  8. DM-per  primary  Camille Bal, MD [facility] [phone] Pager 07/29/2017, 11:53 AM

## 2017-07-29 NOTE — Progress Notes (Signed)
[place] PHYSICAL MEDICINE & REHABILITATION     PROGRESS NOTE  Subjective/Complaints:  Pt seen lying in bed this AM.  He states he slept well overnight.  He notes improvement in shoulder pain.   ROS: Denies CP, SOB, N/V/D  Objective: Vital Signs: Blood pressure (!) 143/62, pulse 72, temperature 97.9 F (36.6 C), temperature source Oral, resp. rate 20, weight 57.2 kg (126 lb 1.7 oz), SpO2 96 %. No results found. Recent Labs    07/28/17 2357  WBC 9.9  HGB 7.0*  HCT 23.0*  PLT 237   Recent Labs    07/28/17 2357  NA 130*  K 4.5  CL 92*  GLUCOSE 99  BUN 82*  CREATININE 9.73*  CALCIUM 7.7*   CBG (last 3)  Recent Labs    07/28/17 1702 07/28/17 1937 07/29/17 0610  GLUCAP 169* 182* 95    Wt Readings from Last 3 Encounters:  07/29/17 57.2 kg (126 lb 1.7 oz)  07/17/17 55.4 kg (122 lb 2.2 oz)  07/11/17 59 kg (130 lb)    Physical Exam:  BP (!) 143/62 (BP Location: Left Arm)   Pulse 72   Temp 97.9 F (36.6 C) (Oral)   Resp 20   Wt 57.2 kg (126 lb 1.7 oz)   SpO2 96%   BMI 19.75 kg/m  Constitutional: No distress . Vital signs reviewed. HENT: Normocephalic.  Atraumatic. Eyes: EOMI. No discharge. Cardiovascular: RRR. No JVD. Respiratory: CTA Bilaterally.  Normal effort. GI: BS +. Non-distended. MSK: Minimal TTP in stump Right BKA edema and tenderness.  RUE: amputation of 2nd digit at PIP LLE: amputation digits  Neurological: He isalertand oriented x2, confused. Motor: B/l UE, LLE, Right hip flexion 4+-5/5 proximal to distal (stable) Skin: BKA with dressing c/d/i  Assessment/Plan: 1. Functional deficits secondary to right BKA which require 3+ hours per day of interdisciplinary therapy in a comprehensive inpatient rehab setting. Physiatrist is providing close team supervision and 24 hour management of active medical problems listed below. Physiatrist and rehab team continue to assess barriers to discharge/monitor patient progress toward functional and medical  goals.  Function:  Bathing Bathing position   Position: Shower  Bathing parts Body parts bathed by patient: Right arm, Left arm, Chest, Abdomen, Front perineal area, Right upper leg, Left upper leg, Buttocks Body parts bathed by helper: Left lower leg, Back, Buttocks  Bathing assist Assist Level: Touching or steadying assistance(Pt > 75%)      Upper Body Dressing/Undressing Upper body dressing   What is the patient wearing?: Pull over shirt/dress     Pull over shirt/dress - Perfomed by patient: Thread/unthread right sleeve, Thread/unthread left sleeve, Put head through opening, Pull shirt over trunk Pull over shirt/dress - Perfomed by helper: Thread/unthread right sleeve, Thread/unthread left sleeve, Put head through opening, Pull shirt over trunk        Upper body assist Assist Level: Supervision or verbal cues      Lower Body Dressing/Undressing Lower body dressing   What is the patient wearing?: Pants, Shoes, Socks     Pants- Performed by patient: Thread/unthread right pants leg, Thread/unthread left pants leg Pants- Performed by helper: Thread/unthread right pants leg, Thread/unthread left pants leg, Pull pants up/down   Non-skid slipper socks- Performed by helper: Don/doff left sock     Shoes - Performed by patient: Don/doff left shoe Shoes - Performed by helper: Don/doff left shoe          Lower body assist Assist for lower body dressing: Touching or steadying  assistance (Pt > 75%)      Toileting Toileting   Toileting steps completed by patient: Performs perineal hygiene Toileting steps completed by helper: Adjust clothing prior to toileting, Performs perineal hygiene, Adjust clothing after toileting Toileting Assistive Devices: Grab bar or rail  Toileting assist Assist level: Touching or steadying assistance (Pt.75%)   Transfers Chair/bed transfer   Chair/bed transfer method: Squat pivot Chair/bed transfer assist level: Touching or steadying assistance  (Pt > 75%) Chair/bed transfer assistive device: Armrests     Locomotion Ambulation Ambulation activity did not occur: Safety/medical concerns         Wheelchair   Type: Manual Max wheelchair distance: 85' Assist Level: Supervision or verbal cues  Cognition Comprehension Comprehension assist level: Understands basic 75 - 89% of the time/ requires cueing 10 - 24% of the time  Expression Expression assist level: Expresses basic 75 - 89% of the time/requires cueing 10 - 24% of the time. Needs helper to occlude trach/needs to repeat words.  Social Interaction Social Interaction assist level: Interacts appropriately 75 - 89% of the time - Needs redirection for appropriate language or to initiate interaction.  Problem Solving Problem solving assist level: Solves basic 75 - 89% of the time/requires cueing 10 - 24% of the time  Memory Memory assist level: Recognizes or recalls 75 - 89% of the time/requires cueing 10 - 24% of the time    Medical Problem List and Plan: 1.Decreased functional mobilitysecondary to right BKA 07/15/2017  Continue therapies  Shrinker ordered, not present, discussed with nursing regarding follow up 2. DVT Prophylaxis/Anticoagulation: Subcutaneous heparin. Monitor for any bleeding episodes 3. Pain Management:Ultram as needed  -may have left RTC tendonitis/bursitis   Left shoulder x-ray reviewed, relatively unremarkable shoulder joint   Voltaren gel ordered on 5/15 with good benefit   Lidoderm patch ordered on 5/20 with good benefit  -Mild right lower limb phantom pain   -Observe for now 4. Mood:Provide emotional support 5. Neuropsych: This patientis?fully capable of making decisions on hisown behalf. 6. Skin/Wound Care:Routine skin checks 7. Fluids/Electrolytes/Nutrition:Routine in and outs  8.Acute on chronic anemia. Continue Niferex  Hb 7.0 on 5/21  Cont to monitor 9.End-stage renal disease with hemodialysis. Follow-up renal  services 10.Diabetes mellitus with peripheral neuropathy. Hemoglobin A1c 7.1.   Lantus 6 units with HD, increased to 8 units on 5/16  Lantus 4 units daily d/ced on 5/14   CBG (last 3)  Recent Labs    07/28/17 1702 07/28/17 1937 07/29/17 0610  GLUCAP 169* 182* 95   Labile on 5/21, likely related to timing with HD 11.ID. Blood cultures Proteus/strep.   Rocephin course to be completed 5/16 12.Hypertension.   Losartan 25 started on 5/13, increased to 5/15  Relatively controlled on 5/21 Vitals:   07/29/17 0317 07/29/17 0348  BP: (!) 145/66 (!) 143/62  Pulse: 68 72  Resp: 17 20  Temp: 98 F (36.7 C) 97.9 F (36.6 C)  SpO2: 97% 96%   13.Constipation. Laxative assistance 14. Leukocytosis: Resolved  WBCs 9.9 on 5/20  Afebrile   Labs with HD 15.  Insomnia   Improved  LOS (Days) 12 A FACE TO FACE EVALUATION WAS PERFORMED  Ankit Karis Juba 07/29/2017 9:31 AM

## 2017-07-29 NOTE — Progress Notes (Signed)
Social Work Patient ID: Charles Vasquez, male   DOB: Nov 08, 1935, 82 y.o.   MRN: [id number]  Spoke with sister, pt and girlfriend to discuss bed offer via [facility] for Thursday. Pt will be glad to be back in [place] and he has been to this facility before and if needs to go this is the place he wants to be. Will work on transfer for Thursday. Nursing home- SW to work on authorization from [employer]. Work on Thursday transfer.

## 2017-07-29 NOTE — Progress Notes (Signed)
Physical Therapy Session Note  Patient Details  Name: Charles Vasquez MRN: [id number] Date of Birth: [birth year]  Today's Date: 07/29/2017 PT Individual Time: 1115-1200 and 1530-1600 PT Individual Time Calculation (min): 45 min and 30 min (total 75 min)   Short Term Goals: Week 2:  PT Short Term Goal 1 (Week 2): =LTG due to estimated LOS  Skilled Therapeutic Interventions/Progress Updates: Tx 1: Pt received seated in w/c, c/o pain as below and reporting "just tired", agreeable to treatment and going outside in an effort to improve mood and engagement in session. Of note, pt with increased confusion during this session, increased difficulty following commands, unaware that he was in [place] and thought he was in hospital close to [place]. Transported outside totalA. Squat pivot transfer to low bench with min guard, transfer off bench modA d/t lower height. While seated on bench, performed BLE long arc quads 3x10 reps, cues for technique and note impaired coordination RLE. RLE seated hamstring stretch with mod cues required for technique and to hold instead of bouncing in/out of stretch. LEU shoulder mobs performed grade I-II for pain management, performed A/P at glenohumeral joint. Pt reporting moderate improvement in pain. W/c propulsion outdoors on uneven surfaces and indoors level surfaces with standbyA; limited d/t pain after longer distance. Remained seated in w/c at end of session, chair alarm and quick release belt intact, all needs in reach.   tx 2: Pt received seated in w/c eating lunch; requests to finish eating prior to participation. Session delayed 30 min. Pt transported to gym totalA for energy conservation. Squat pivot transfer w/c <>nustep minA. Performed nustep total 5 min with several rest breaks and patient able to perform only 30-45 sec at a time before requiring rest break. Pt initially performed with BUE and LLE, eventually performed RUE/LLE only d/t L shoulder pain. Returned to bed  minA squat pivot, required modA to reposition in bed. Pt requests assistance dialing girlfriends phone number; provides therapist with non-sensical 12+ digit phone number. Therapist looked up number in pt's chart and dialed for pt. Remained in bed with all needs in reach, alarm intact.     Therapy Documentation Precautions:  Precautions Precautions: Fall Restrictions Weight Bearing Restrictions: Yes RLE Weight Bearing: Non weight bearing Pain: Pain Assessment Pain Scale: 0-10 Pain Score: 7  Pain Type: Chronic pain Pain Location: Shoulder Pain Orientation: Left Pain Descriptors / Indicators: Aching Patients Stated Pain Goal: 2 Pain Intervention(s): Medication (See eMAR)  See Function Navigator for Current Functional Status.   Therapy/Group: Individual Therapy  Vista Lawman 07/29/2017, 11:58 AM

## 2017-07-29 NOTE — Progress Notes (Signed)
Physical Therapy Session Note  Patient Details  Name: Charles Vasquez MRN: [id number] Date of Birth: [birth year]  Today's Date: 07/29/2017 PT Individual Time: 0459-9774 PT Individual Time Calculation (min): 21 min  PT Missed time (min): 9 min (fatigue, pain)   Short Term Goals: Week 2:  PT Short Term Goal 1 (Week 2): =LTG due to estimated LOS  Skilled Therapeutic Interventions/Progress Updates:   Pt asleep in supine and easily awoken, agreeable to therapy but reports his stomach does not feel well, did not rate. He appeared fatigued as it required multiple attempts for him to sit up from supine w/ min assist. Transferred to w/c via squat pivot w/ min assist. Session focused on functional locomotion w/ w/c mobility and stretching of residual limb. Pt self-propelled w/c 100' w/ increased time using BUEs. Therapist performed static hamstring and quad stretching in gym w/ prolonged stretch at end range within pt's tolerance, no pain at end range of knee motions however. Returned to room and pt agreeable to sit up in w/c until next session. Quick release belt and chair alarm engaged, call bell within reach. Missed 9 min of skilled PT 2/2 fatigue/stomach discomfort.   Therapy Documentation Precautions:  Precautions Precautions: Fall Restrictions Weight Bearing Restrictions: Yes RLE Weight Bearing: Non weight bearing General: PT Amount of Missed Time (min): 9 Minutes PT Missed Treatment Reason: Pain;Patient fatigue  See Function Navigator for Current Functional Status.   Therapy/Group: Individual Therapy  [name] 07/29/2017, 9:37 AM

## 2017-07-30 ENCOUNTER — Inpatient Hospital Stay (HOSPITAL_COMMUNITY): Payer: Medicare Other | Admitting: Occupational Therapy

## 2017-07-30 ENCOUNTER — Inpatient Hospital Stay (HOSPITAL_COMMUNITY): Payer: Medicare Other | Admitting: Physical Therapy

## 2017-07-30 ENCOUNTER — Inpatient Hospital Stay (HOSPITAL_COMMUNITY): Payer: Medicare Other

## 2017-07-30 DIAGNOSIS — R0989 Other specified symptoms and signs involving the circulatory and respiratory systems: Secondary | ICD-10-CM

## 2017-07-30 LAB — GLUCOSE, CAPILLARY
GLUCOSE-CAPILLARY: 228 mg/dL — AB (ref 65–99)
GLUCOSE-CAPILLARY: 127 mg/dL — AB (ref 65–99)
GLUCOSE-CAPILLARY: 198 mg/dL — AB (ref 65–99)
Glucose-Capillary: 191 mg/dL — ABNORMAL HIGH (ref 65–99)

## 2017-07-30 LAB — PREPARE RBC (CROSSMATCH)

## 2017-07-30 MED ORDER — TRAMADOL HCL 50 MG PO TABS: 50.0000 mg | ORAL_TABLET | Freq: Four times a day (QID) | ORAL | 0 refills | Status: DC | PRN

## 2017-07-30 MED ORDER — SODIUM CHLORIDE 0.9 % IV SOLN: Freq: Once | INTRAVENOUS | Status: DC

## 2017-07-30 MED ORDER — DOXERCALCIFEROL 4 MCG/2ML IV SOLN: 2.0000 ug | INTRAVENOUS | Status: DC

## 2017-07-30 MED ORDER — RENA-VITE PO TABS: 1.0000 | ORAL_TABLET | Freq: Every day | ORAL | 0 refills | Status: AC

## 2017-07-30 MED ORDER — POLYSACCHARIDE IRON COMPLEX 150 MG PO CAPS: 150.0000 mg | ORAL_CAPSULE | Freq: Two times a day (BID) | ORAL | Status: AC

## 2017-07-30 MED ORDER — DOXERCALCIFEROL 4 MCG/2ML IV SOLN
INTRAVENOUS | Status: AC
  Filled 2017-07-30: qty 2

## 2017-07-30 MED ORDER — SEVELAMER CARBONATE 800 MG PO TABS: 1600.0000 mg | ORAL_TABLET | Freq: Three times a day (TID) | ORAL | Status: AC

## 2017-07-30 MED ORDER — DICLOFENAC SODIUM 1 % TD GEL: 2.0000 g | Freq: Four times a day (QID) | TRANSDERMAL | Status: DC

## 2017-07-30 MED ORDER — DARBEPOETIN ALFA 200 MCG/0.4ML IJ SOSY: 200.0000 ug | PREFILLED_SYRINGE | INTRAMUSCULAR | Status: DC

## 2017-07-30 MED ORDER — LIDOCAINE 5 % EX PTCH: 1.0000 | MEDICATED_PATCH | CUTANEOUS | 0 refills | Status: DC

## 2017-07-30 MED ORDER — PANTOPRAZOLE SODIUM 40 MG PO TBEC: 40.0000 mg | DELAYED_RELEASE_TABLET | Freq: Two times a day (BID) | ORAL | Status: AC

## 2017-07-30 MED ORDER — INSULIN GLARGINE 100 UNIT/ML ~~LOC~~ SOLN: 8.0000 [IU] | SUBCUTANEOUS | 11 refills | Status: DC

## 2017-07-30 NOTE — Patient Care Conference (Signed)
Inpatient RehabilitationTeam Conference and Plan of Care Update Date: 07/30/2017   Time: 2:15 PM    Patient Name: Charles Vasquez      Medical Record Number: [id number]  Date of Birth: October 24, 1935 Sex: Male         Room/Bed: 4M11C/4M11C-01 Payor Info: Payor: [employer] / Plan: [employer] MEDICARE / Product Type: *No Product type* /    Admitting Diagnosis: BKA  Admit Date/Time:  07/17/2017  6:34 PM Admission Comments: No comment available   Primary Diagnosis:  <principal problem not specified> Principal Problem: <principal problem not specified>  Patient Active Problem List   Diagnosis Date Noted  . Labile blood pressure   . Chronic left shoulder pain   . Bacteremia   . Labile blood glucose   . Hypertensive crisis   . Hypoglycemia   . Leukocytosis   . Acute blood loss anemia   . Anemia of chronic disease   . ESRD on dialysis (HCC)   . Type 2 diabetes mellitus with peripheral neuropathy (HCC)   . Benign essential HTN   . 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 Pressure injury of skin 07/13/2017  . Diabetic foot infection (HCC) 07/12/2017  . CAD (coronary artery disease) 07/12/2017  . Type II diabetes mellitus with renal manifestations (HCC) 07/12/2017  . Wound of right foot   . Loose stools 05/20/2017  . Abdominal pain   . Loss of weight 03/18/2017  . Abnormal CT scan, colon 03/18/2017  . Melena 03/18/2017  . Periumbilical abdominal pain 03/18/2017  . ESRD (end stage renal disease) on dialysis (HCC) 10/22/2016  . Venous hypertension status post AVG procedure 08/10/2016  . Anasarca 08/04/2016  . Facial swelling 08/04/2016  . Effusion, other site 08/04/2016  . 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 Acute respiratory failure (HCC) 08/04/2016  . Hyperkalemia 08/04/2016  . Aftercare following surgery of the circulatory system, NEC-Right  AVGG 09/25/2012  . Other complications due to renal dialysis device, implant, and graft 05/01/2012   . End stage renal disease (HCC) 03/17/2012  . FATIGUE, ACUTE 08/22/2008  . HEMATURIA UNSPECIFIED 07/11/2008  . Diabetes (HCC) 01/19/2008  . Essential hypertension 01/19/2008  . MYOCARDIAL INFARCTION, HX OF 01/19/2008  . GERD 01/19/2008  . OSTEOARTHRITIS 01/19/2008  . CEREBROVASCULAR ACCIDENT, HX OF 01/19/2008    Expected Discharge Date: Expected Discharge Date: 07/31/17  Team Members Present: Physician leading conference: Dr. 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  mod assist for squat pivot transfers.  Left shoulder still painful but improved.    downgraded to min to mod assist  self care training, balance activities, therapeutic exercice, pt/family education   Mobility   S bed mobility, min guard/minA transfers, S w/c mobility, min/modA sit >stand  S overall w/c level, minA car transfers  transfer training, LE ROM, amputee education, activity tolerance   Communication             Safety/Cognition/ Behavioral Observations            Pain   no c/o pain; may c/o pain to L shoulder  <3 out of 10  apply voltaren gel and lidocaine patch to L shoulder; assess pain q shift and prn; medicate as needed   Skin   R BKA with shrinker;   free from infection/ breakdown with min assist  assess q shift and prn      *See Care Plan and progress notes for long  and short-term goals.     Barriers to Discharge  Current Status/Progress Possible Resolutions Date Resolved   Physician    Medical stability;Wound Care;Weight bearing restrictions;IV antibiotics     See above  Therapies, DM/BP meds, follow labs      Nursing                  PT                    OT                  SLP                SW                Discharge Planning/Teaching Needs:  NH bed at [facility] awaiting approval to transfer tomorrow. Sister and girlfriend aware of this.      Team Discussion:  Progressing slowly due to shoulder pain and low hemoglobin. HD to transfuse him in HD today. Downgraded goals to min-mod level. Pt needs longer to make progress in therapies-pursuing NHP, bed offer for tomorrow  Revisions to Treatment Plan:  NH bed tomorrow awaiting approval    Continued Need for Acute Rehabilitation Level of Care: The patient requires daily medical management by a physician with specialized training in physical medicine and rehabilitation for the following conditions: Daily direction of a multidisciplinary physical rehabilitation program to ensure safe treatment while eliciting the highest outcome that is of practical value to the patient.: Yes Daily medical management of patient stability for increased activity during participation in an intensive rehabilitation regime.: Yes Daily analysis of laboratory values and/or radiology reports with any subsequent need for medication adjustment of medical intervention for : Post surgical problems;Diabetes problems;Blood pressure problems;Renal problems  Lucy Chris 07/30/2017, 2:57 PM

## 2017-07-30 NOTE — Progress Notes (Signed)
[facility] Progress Note  Subjective:  Appears plan is for SNF [facility] Notes say Thursday transfer Due for HD today  Vitals:   07/29/17 1330 07/29/17 2021 07/30/17 0402 07/30/17 0823  BP: (!) 156/62 (!) 152/65 (!) 137/57 (!) 166/69  Pulse: 74 74 77 75  Resp: 16 18 18    Temp: 98.1 F (36.7 C) 98.3 F (36.8 C) 98 F (36.7 C)   TempSrc: Oral     SpO2:  100% 92%   Weight:       Physical Exam Alert no distress No jvd Lungs clear Regular S1S2 normal No S3 oer S4 Abd soft, NT Ext trace  LLE; R BKA L thigh AVG + bruit Old nonfx accesses left arm  Inpatient medications: . darbepoetin (ARANESP) injection - DIALYSIS  200 mcg Intravenous Q Mon-HD  . diclofenac sodium  2 g Topical QID  . dicyclomine  10 mg Oral BID WC  . doxercalciferol  2 mcg Intravenous Q M,W,F-HD  . feeding supplement (NEPRO CARB STEADY)  237 mL Oral BID BM  . feeding supplement (PRO-STAT SUGAR FREE 64)  30 mL Oral BID  . heparin  5,000 Units Subcutaneous Q8H  . insulin aspart  0-9 Units Subcutaneous TID WC  . insulin glargine  8 Units Subcutaneous Q M,W,F-2000  . iron polysaccharides  150 mg Oral BID  . lidocaine  1 patch Transdermal Q24H  . losartan  50 mg Oral Daily  . multivitamin  1 tablet Oral Daily  . pantoprazole  40 mg Oral BID WC  . sevelamer carbonate  1,600 mg Oral TID WC   . sodium chloride     acetaminophen **OR** acetaminophen, alum & mag hydroxide-simeth, calcium carbonate, heparin, loperamide, ondansetron **OR** ondansetron (ZOFRAN) IV, phenol, polyvinyl alcohol, senna-docusate, sevelamer carbonate, sorbitol, traMADol   Recent Labs  Lab 07/23/17 1655 07/25/17 1446 07/28/17 2357  NA 131* 133* 130*  K 4.4 5.1 4.5  CL 94* 94* 92*  CO2 24 24 22   GLUCOSE 140* 178* 99  BUN 52* 50* 82*  CREATININE 7.15* 8.36* 9.73*  CALCIUM 7.7* 8.2* 7.7*  PHOS 4.1 4.9* 5.2*   Recent Labs  Lab 07/23/17 1655 07/25/17 1446 07/28/17 2357  ALBUMIN 1.9* 2.0* 1.9*   Recent  Labs  Lab 07/23/17 1605 07/25/17 1446 07/28/17 2357  WBC 9.1 30.4* 9.9  HGB 7.7* 7.8* 7.0*  HCT 25.3* 25.1* 23.0*  MCV 88.8 88.7 86.5  PLT 227 246 237   Iron/TIBC/Ferritin/ %Sat    Component Value Date/Time   IRON 56 07/20/2017 1106   TIBC 115 (L) 07/20/2017 1106   IRONPCTSAT 49 (H) 07/20/2017 1106   HD orders: [name] MWF 3hr   [phone]  Discharge EDW will be 57 kg 2/2.5 bath   350/600   Hep 2000 then 500 u/hr -Epogen 7200 unit IV TIW -Hectorol 2 mcg IV TIW     Impression: 1. S/P R BKA 07/15/17 (diab foot wound) by [name]. Rehab. SNF placement [facility] planned for Thursday 2. Proteus/ strep bacteremia - completed course of rocephin IV 3. ESRD -MWF [name].  New dry wt around 57 kg 4. Anemia - Getting darbe 200 mcg IV q week, last dosed 5/21 200 mcg.  Cont oral Fe.  Tsat 49% on 5/12. Hb down to 7 pre HD yesterday. Plan to transfuse today with HD if Hb <7.5 5. Secondary hyperparathyroidism - Last Ca 7.6 C Ca 9.2 Phos 4.5. Continue binders (sevelamer). Hectorol with HD for PTH.  6. HTN -  losartan 50 qd  7. Nutrition - Renal/Carb mod diet. Added prostat, renal vit.  8. DM-per primary  Camille Bal, MD [facility] [phone] Pager 07/30/2017, 1:35 PM

## 2017-07-30 NOTE — Progress Notes (Signed)
Physical Therapy Discharge Summary  Patient Details  Name: Charles Vasquez MRN: [id number] Date of Birth: 1936-03-11  Today's Date: 07/30/2017 PT Individual Time: 0915-1025 PT Individual Time Calculation (min): 70 min    Patient has met 6 of 7 long term goals due to improved activity tolerance, improved balance, improved postural control, increased strength, decreased pain and ability to compensate for deficits.  Patient to discharge at a wheelchair level [place].   Patient's care partner unavailable to provide the necessary physical and cognitive assistance at discharge; pt to d/c to SNF for continued progress towards long term goals.   Reasons goals not met: Requires minA bed <>w/c transfer  Recommendation:  Patient will benefit from ongoing skilled PT services in skilled nursing facility setting to continue to advance safe functional mobility, address ongoing impairments in strength, ROM, pain, activity tolerance, and minimize fall risk.  Equipment: No equipment provided  Reasons for discharge: treatment goals met and discharge from hospital  Patient/family agrees with progress made and goals achieved: Yes  PT Discharge Precautions/Restrictions Precautions Precautions: Fall Required Braces or Orthoses: Other Brace/Splint Other Brace/Splint: shrinker sock R residual limb Restrictions Weight Bearing Restrictions: Yes RLE Weight Bearing: Non weight bearing(BKA) Vital Signs Therapy Vitals Temp: 98 F (36.7 C) Pulse Rate: 77 Resp: 18 BP: (!) 137/57 Patient Position (if appropriate): Lying Oxygen Therapy SpO2: 92 % O2 Device: Room Air Pain Pain Assessment Pain Scale: 0-10 Pain Score: 0-No pain Vision/Perception  Perception Perception: Within Functional Limits Praxis Praxis: Intact  Cognition Overall Cognitive Status: Impaired/Different from baseline Arousal/Alertness: Awake/alert Orientation Level: Oriented to person;Oriented to place;Oriented to time Attention:  Sustained Sustained Attention: Impaired Memory: Impaired Memory Impairment: Decreased recall of new information Awareness: Impaired Problem Solving: Impaired Safety/Judgment: Impaired Sensation Sensation Light Touch: Appears Intact Proprioception: Appears Intact Coordination Gross Motor Movements are Fluid and Coordinated: No Heel Shin Test: NT d/t amputation; jerky/ataxic movements in BLEs R>L Motor  Motor Motor: Within Functional Limits Motor - Discharge Observations: generalized weakness, decreased LUE functional use d/t shoulder pain  Mobility Bed Mobility Bed Mobility: Sit to Supine;Supine to Sit Supine to Sit: 5: Supervision;HOB flat Supine to Sit Details: Verbal cues for technique;Verbal cues for precautions/safety Sit to Supine: 5: Supervision;HOB flat Sit to Supine - Details: Verbal cues for technique;Verbal cues for precautions/safety Transfers Transfers: Yes Sit to Stand: 4: Min assist;With upper extremity assist Sit to Stand Details: Verbal cues for technique;Verbal cues for precautions/safety;Verbal cues for safe use of DME/AE;Tactile cues for sequencing;Tactile cues for weight shifting;Tactile cues for placement Sit to Stand Details (indicate cue type and reason): cues for hand placement with one back on mat, cues for forward trunk lean over L knee, heavy reliance on back of knee on mat/chair Squat Pivot Transfers: 4: Min assist;4: Min Teacher, music Details: Verbal cues for precautions/safety;Verbal cues for technique Locomotion  Ambulation Ambulation: No Gait Gait: No Stairs / Additional Locomotion Stairs: No Architect: Yes Wheelchair Assistance: 5: Investment banker, operational Details: Verbal cues for Marketing executive: Both upper extremities Wheelchair Parts Management: Needs assistance(d/t neuropathy, impaired vision and decr problem solving) Distance: 150'; limited distance d/t shoulder pain   Trunk/Postural Assessment  Cervical Assessment Cervical Assessment: Within Functional Limits Thoracic Assessment Thoracic Assessment: Within Functional Limits Lumbar Assessment Lumbar Assessment: Within Functional Limits(posterior pelvic tilt) Postural Control Postural Control: Deficits on evaluation(delayed righting reactions in sitting/standing, absent ankle strategy in standing)  Balance Balance Balance Assessed: Yes Static Sitting Balance Static Sitting - Balance Support: No upper  extremity supported;Feet supported Static Sitting - Level of Assistance: 6: Modified independent (Device/Increase time) Dynamic Sitting Balance Dynamic Sitting - Balance Support: Feet supported;No upper extremity supported Dynamic Sitting - Level of Assistance: 6: Modified independent (Device/Increase time) Static Standing Balance Static Standing - Balance Support: Bilateral upper extremity supported Static Standing - Level of Assistance: 5: Stand by assistance Extremity Assessment  RUE Assessment RUE Assessment: Exceptions to [facility] shoulder flexion approximately 0-120 degrees.  All other joints AROM WFLS with strength 4/5 throughout.  Slight arthritic changes noted in digits with amputation of the index finger PIP joint.  ) LUE Assessment LUE Assessment: Exceptions to [facility] LUE Strength LUE Overall Strength Comments: Pt demonstrates AROM shoulder flexion less than 70 degrees.  AAROM 0- 100 degrees with pain above 80 degrees noted.  Increased tightness noted in pectoral and internal rotators.  Pt with full AROM elbow flexion and extension with grip strength 4/5.  Noted left DIP amputation of the middle digit.   RLE Assessment RLE Assessment: Exceptions to [facility] RLE Strength Right Hip Flexion: 4/5 Right Knee Flexion: 4/5 Right Knee Extension: 4/5 LLE Assessment LLE Assessment: Exceptions to WFL LLE PROM (degrees) Overall PROM Left Lower Extremity: Deficits;Due to premorbid status LLE Overall PROM  Comments: ankle ROM limited to neutral dorsiflexion, 20 degrees plantarflexion; suspect pre-morbid LLE Strength Left Hip Flexion: 4/5 Left Knee Flexion: 4-/5 Left Knee Extension: 4-/5 Left Ankle Dorsiflexion: 1/5 Left Ankle Plantar Flexion: 2/5  Skilled Therapeutic Intervention: Pt received seated in w/c with c/o abdominal pain from constipation and having recently taking medication to assist with moving bowels. Initially declining to leave room or attempt toileting. R residual limb dressing changed with mild amount of bleeding from lateral edge. Therapist washed and exchanged shrinker sock, donned totalA. Performed BLE PROM to hamstrings, LLE gastroc/soleus and assessed strength/ROM as above. Pt then agreeable to attempt toileting. Performed minA sqat pivot w/c <>BSC, performed partial sit <>stand with grab bars for therapist to manage clothing. Assessed remaining mobility as above with minA overall, S w/c propulsion. Pt with no further questions or concerns regarding d/c to SNF at this time. Remained seated in w/c with quick release belt and chair alarm intact, all needs in reach.   See Function Navigator for Current Functional Status.  Benjiman Core Tygielski 07/30/2017, 7:42 AM

## 2017-07-30 NOTE — Progress Notes (Addendum)
[facility] PHYSICAL MEDICINE & REHABILITATION     PROGRESS NOTE  Subjective/Complaints:  Patient seen lying in bed this morning. He states he slept well overnight. Confirm with sleep chart.   ROS: Denies CP, SOB, N/V/D  Objective: Vital Signs: Blood pressure (!) 166/69, pulse 75, temperature 98 F (36.7 C), resp. rate 18, weight 57.2 kg (126 lb 1.7 oz), SpO2 92 %. No results found. Recent Labs    07/28/17 2357  WBC 9.9  HGB 7.0*  HCT 23.0*  PLT 237   Recent Labs    07/28/17 2357  NA 130*  K 4.5  CL 92*  GLUCOSE 99  BUN 82*  CREATININE 9.73*  CALCIUM 7.7*   CBG (last 3)  Recent Labs    07/29/17 1700 07/29/17 2121 07/30/17 0635  GLUCAP 179* 216* 228*    Wt Readings from Last 3 Encounters:  07/29/17 57.2 kg (126 lb 1.7 oz)  07/17/17 55.4 kg (122 lb 2.2 oz)  07/11/17 59 kg (130 lb)    Physical Exam:  BP (!) 166/69   Pulse 75   Temp 98 F (36.7 C)   Resp 18   Wt 57.2 kg (126 lb 1.7 oz)   SpO2 92%   BMI 19.75 kg/m  Constitutional: No distress . Vital signs reviewed. HENT: Normocephalic.  Atraumatic. Eyes: EOMI. No discharge. Cardiovascular: RRR. No JVD. Respiratory: CTA Bilaterally.  Normal effort. GI: BS +. Non-distended. MSK: Minimal TTP in stump, improving Right BKA edema and tenderness.  RUE: amputation of 2nd digit at PIP LLE: amputation digits  Neurological: He isalertand oriented x2, confused. Motor: B/l UE, LLE, Right hip flexion 4+-5/5 proximal to distal (unchanged) Skin: BKA with dressing c/d/i  Assessment/Plan: 1. Functional deficits secondary to right BKA which require 3+ hours per day of interdisciplinary therapy in a comprehensive inpatient rehab setting. Physiatrist is providing close team supervision and 24 hour management of active medical problems listed below. Physiatrist and rehab team continue to assess barriers to discharge/monitor patient progress toward functional and medical goals.  Function:  Bathing Bathing position    Position: Shower  Bathing parts Body parts bathed by patient: Right arm, Left arm, Chest, Abdomen, Front perineal area, Right upper leg, Left upper leg, Buttocks Body parts bathed by helper: Left lower leg, Back, Buttocks  Bathing assist Assist Level: Touching or steadying assistance(Pt > 75%)      Upper Body Dressing/Undressing Upper body dressing   What is the patient wearing?: Pull over shirt/dress     Pull over shirt/dress - Perfomed by patient: Thread/unthread right sleeve, Thread/unthread left sleeve, Put head through opening, Pull shirt over trunk Pull over shirt/dress - Perfomed by helper: Thread/unthread right sleeve, Thread/unthread left sleeve, Put head through opening, Pull shirt over trunk        Upper body assist Assist Level: Supervision or verbal cues      Lower Body Dressing/Undressing Lower body dressing   What is the patient wearing?: Pants     Pants- Performed by patient: Thread/unthread right pants leg, Thread/unthread left pants leg Pants- Performed by helper: Pull pants up/down   Non-skid slipper socks- Performed by helper: Don/doff left sock     Shoes - Performed by patient: Don/doff left shoe Shoes - Performed by helper: Don/doff left shoe          Lower body assist Assist for lower body dressing: Touching or steadying assistance (Pt > 75%)      Toileting Toileting   Toileting steps completed by patient: Performs perineal hygiene  Toileting steps completed by helper: Adjust clothing prior to toileting, Adjust clothing after toileting Toileting Assistive Devices: Grab bar or rail  Toileting assist Assist level: Touching or steadying assistance (Pt.75%)   Transfers Chair/bed transfer   Chair/bed transfer method: Squat pivot Chair/bed transfer assist level: Touching or steadying assistance (Pt > 75%) Chair/bed transfer assistive device: Armrests     Locomotion Ambulation Ambulation activity did not occur: Safety/medical concerns          Wheelchair   Type: Manual Max wheelchair distance: 100 Assist Level: Supervision or verbal cues  Cognition Comprehension Comprehension assist level: Understands basic 75 - 89% of the time/ requires cueing 10 - 24% of the time  Expression Expression assist level: Expresses basic 75 - 89% of the time/requires cueing 10 - 24% of the time. Needs helper to occlude trach/needs to repeat words.  Social Interaction Social Interaction assist level: Interacts appropriately 75 - 89% of the time - Needs redirection for appropriate language or to initiate interaction.  Problem Solving Problem solving assist level: Solves basic 75 - 89% of the time/requires cueing 10 - 24% of the time  Memory Memory assist level: Recognizes or recalls 75 - 89% of the time/requires cueing 10 - 24% of the time    Medical Problem List and Plan: 1.Decreased functional mobilitysecondary to right BKA 07/15/2017  D/c tomorrow to SNF  Will see patient for hospital follow-up in one month 2. DVT Prophylaxis/Anticoagulation: Subcutaneous heparin. Monitor for any bleeding episodes 3. Pain Management:Ultram as needed  -may have left RTC tendonitis/bursitis   Left shoulder x-ray reviewed, relatively unremarkable shoulder joint   Voltaren gel ordered on 5/15 with good benefit   Lidoderm patch ordered on 5/20 with good benefit  -Mild right lower limb phantom pain   -Observe for now 4. Mood:Provide emotional support 5. Neuropsych: This patientis?fully capable of making decisions on hisown behalf. 6. Skin/Wound Care:Routine skin checks 7. Fluids/Electrolytes/Nutrition:Routine in and outs  8.Acute on chronic anemia. Continue Niferex  Hb 7.0 on 5/21  Cont to monitor 9.End-stage renal disease with hemodialysis. Follow-up renal services 10.Diabetes mellitus with peripheral neuropathy. Hemoglobin A1c 7.1.   Lantus 6 units with HD, increased to 8 units on 5/16  Lantus 4 units daily d/ced on 5/14   CBG (last 3)   Recent Labs    07/29/17 1700 07/29/17 2121 07/30/17 0635  GLUCAP 179* 216* 228*   Was relatively controlled, however? Trending up in last 24 hours  Will require ambulatory monitoring 11.ID. Blood cultures Proteus/strep.   Rocephin course to be completed 5/16 12.Hypertension.   Losartan 25 started on 5/13, increased to 5/15  Labile and last 24 hours, otherwise relatively controlled  Will require ambulatory monitoring Vitals:   07/30/17 0402 07/30/17 0823  BP: (!) 137/57 (!) 166/69  Pulse: 77 75  Resp: 18   Temp: 98 F (36.7 C)   SpO2: 92%    13.Constipation. Laxative assistance 14. Leukocytosis: Resolved  WBCs 9.9 on 5/20  Afebrile   Labs with HD 15.  Insomnia   resolved  LOS (Days) 13 A FACE TO FACE EVALUATION WAS PERFORMED  Antonela Freiman Karis Juba 07/30/2017 9:06 AM

## 2017-07-30 NOTE — Discharge Summary (Signed)
Discharge summary job [id number]

## 2017-07-30 NOTE — Discharge Summary (Signed)
Charles Vasquez, Charles Vasquez MEDICAL RECORD [id number] ACCOUNT [id number] DATE OF BIRTH:08/11/1935 FACILITY: [facility] LOC[facility]: [facility] PHYSICIAN:ANKIT PATEL, MD  DISCHARGE SUMMARY  DATE OF DISCHARGE:  07/31/2017  DATE OF ADMISSION:  07/17/2017  DATE OF DISCHARGE:  07/31/2017  DISCHARGE DIAGNOSES: 1.  Right below-knee amputation 07/15/2017. 2.  Subcutaneous heparin for deep venous thrombosis prophylaxis. 3.  Pain management. 4.  Acute-on-chronic anemia. 5.  End-stage renal disease with hemodialysis. 6.  Diabetes mellitus, peripheral neuropathy. 7.  Hypertension. 8.  Constipation.  HISTORY OF PRESENT ILLNESS:  This is a 82 year old right-handed male with history of hypertension, CAD, with end-stage renal disease, hemodialysis, as well as diabetes mellitus.  The patient lives alone, has a girlfriend that checks on him regularly.   Presented 07/12/2017 with chronic right wound foot infection with progressive ischemic changes.  The patient with recent right lower extremity angiogram with attempted revascularization, unsuccessful 5 weeks ago.  Limb was not felt to be salvageable and  underwent right below-knee amputation 07/15/2017 per [name].  HOSPITAL COURSE:  Pain management as well as hemodialysis ongoing.  Acute-on-chronic anemia 8.9.  He did receive 2 units of packed red blood cells.  Blood cultures with Proteus strep, maintained on Rocephin.  Followup of lower extremity arterial duplex  completed showing mild plaque noted throughout.  No evidence of significant stenosis.  Physical and occupational therapy ongoing.  The patient was admitted for a comprehensive rehab program.  PAST MEDICAL HISTORY:  See discharge diagnoses.  SOCIAL HISTORY:  The patient lives alone, has a girlfriend who checks on him regularly.   FUNCTIONAL STATUS:  Upon admission to rehab services was mod to max assist for mobility, as well as activities of daily living.  PHYSICAL EXAMINATION: VITAL SIGNS:  Blood  pressure 168/72, pulse 60, temperature 98, respirations 18. GENERAL:  Alert male in no acute distress.  EOMs intact. NECK:  Supple, nontender, no JVD. CARDIOVASCULAR:  Rate controlled. ABDOMEN:  Soft, nontender.  Good bowel sounds. LUNGS:  Clear to auscultation without wheeze.   EXTREMITIES:  Mild edema.  BKA site was dressed, clean.  REHABILITATION HOSPITAL COURSE:  The patient was admitted to inpatient rehabilitation services with therapies initiated on a 3-hour daily basis, consisting of physical therapy, occupational therapy and rehabilitation nursing.  The following issues were  addressed during patient's rehabilitation stay: 1.  Pertaining to the patient's right below-knee amputation 07/15/2017,  surgical site healing nicely.  He would follow up with [name] of vascular surgery. 2.  Subcutaneous heparin for DVT prophylaxis.  No bleeding episodes. 3.  Pain management.  The use of Voltaren gel, as well as Lidoderm patch and Ultram as needed. 4.  Acute-on-chronic anemia.  No bleeding episodes and monitored.  He remained on iron supplement. 5.  End-stage renal disease with hemodialysis is ongoing as per renal services. 6.  Diabetes mellitus, peripheral neuropathy.  Hemoglobin A1c of 7.1.  He remained on insulin therapy as directed. 7.  He completed a course of Rocephin for blood cultures positive proteus.  He remained afebrile. 8.  Blood pressure is controlled with no orthostatic changes. 9.  Bouts of constipation resolved with laxative assistance.  The patient received weekly collaborative interdisciplinary team conferences to discuss  estimated length of stay, family teaching, any barriers to discharge.  Working with energy conservation techniques.  Transfer to wheelchair by squat-pivot transfers  with minimal assistance.  Self-propelled his wheelchair 100 feet using bilateral upper extremities.  A quick release belt was in place for patient's safety.  Activities of  daily living and  homemaking:  He would decline a shower at times.  Completed  squat-pivot transfers, wheelchair to drop-arm bedside commode over toilet.  Minimal assistance using grab bars.  He was able to have a bowel movement, needing very little assistance for hygiene.  Full teaching was completed; however, patient did not have  enough physical assistance at home.  It was felt skilled nursing facility was needed with bed becoming available 07/31/2017.  DISCHARGE MEDICATIONS:  Included Voltaren gel 2 g 4 times a day to affected area; Bentyl 10 mg p.o. b.i.d.; Lantus insulin 8 units subcutaneous Monday, Wednesday, Fridays; Niferex 150 mg p.o. b.i.d.; Lidoderm patch daily; Cozaar 50 mg p.o. daily;  Rena-Vite 1 tablet p.o. daily; Protonix 40 mg p.o. b.i.d.; Renvela 1600 mg p.o. t.i.d.; Ultram 50 mg p.o. every 6 hours as needed for pain.  DIET:  Diabetic renal 1200 mL fluid-restriction diet.  FOLLOWUP:  The patient would follow up with Dr. Maryla Morrow at the outpatient rehab service office as directed.  Dr. [name], call for appointment in 2 weeks.  Dr. Delano Metz, renal services.  LN/NUANCE D:07/30/2017 T:07/30/2017 JOB:[id number]

## 2017-07-30 NOTE — Progress Notes (Addendum)
Occupational Therapy Discharge Summary  Patient Details  Name: Charles Vasquez MRN: [id number] Date of Birth: [birth year]  Today's Date: 07/30/2017 OT Individual Time: 1102-1201 OT Individual Time Calculation (min): 59 min    Session Note:  Pt worked on bathing and dressing during session.  Mod assist for removal of clothing and for transfer squat pivot to the tub bench from the wheelchair.  Once on the seat he was able to complete bathing with min assist, with lateral leans for washing peri area.  He transferred out to the wheelchair with mod assist to work on dressing tasks sit to stand at the sink.  Supervision to [employer] with mod assist for donning pants.  He needed min assist to prop the LLE and donn gripper sock with max assist to donn shoe and tie it.  Finished session with pt up in the wheelchair with call button and phone in reach and safety belt in place.  Pt's girlfriend present in the room as well.   Patient has met 10 of 10 long term goals due to improved activity tolerance, improved balance and ability to compensate for deficits.  Patient to discharge at overall Mod Assist level.  Patient's care partner unavailable to provide the necessary physical and cognitive assistance at discharge.    Reasons goals not met: NA  Recommendation:  Patient will benefit from ongoing skilled OT services in skilled nursing facility setting to continue to advance functional skills in the area of BADL and Reduce care partner burden.  Pt still demonstrates significant deficits with regards to balance and functional transfers.  He continues to need overall mod assist for LB dressing tasks as well as toileting tasks.  Left shoulder dysfunction is also present which requires continued attention in order to increase overall independence.  Recommend continued OT at SNF level for follow-up.   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 premorbidly) Perception  Perception: Within Functional Limits Praxis Praxis: Intact Cognition Overall Cognitive Status: Impaired/Different from baseline Arousal/Alertness: Awake/alert Orientation Level: Oriented to person;Oriented to place;Oriented to time Attention: Sustained Sustained Attention: Impaired Memory: Impaired Memory Impairment: Decreased recall of new information Awareness: Impaired Problem Solving: Impaired Safety/Judgment: Impaired Sensation Sensation Light Touch: Appears Intact Stereognosis: Appears Intact Hot/Cold: Appears Intact Proprioception: Appears Intact Coordination Gross Motor Movements are Fluid and Coordinated: No Fine Motor Movements are Fluid and Coordinated: No Coordination and Movement Description: Pt demonstrates decreased bilateral FM coordination secondary to arthritic changes and amputations of the index DIP and middle finger DIP of the left and right hands.  Motor  Motor Motor - Discharge Observations: generalized weakness, decreased LUE functional use d/t shoulder pain Mobility    See Function Section of chart for details  Trunk/Postural Assessment  Cervical Assessment Cervical Assessment: Within Functional Limits Thoracic Assessment Thoracic Assessment: Within Functional Limits Lumbar Assessment Lumbar Assessment: Within Functional Limits  Balance Balance Balance Assessed: Yes Static Sitting Balance Static Sitting - Balance Support: No upper extremity supported;Feet supported Static Sitting - Level of Assistance:  6: Modified independent (Device/Increase time) Dynamic Sitting Balance Dynamic Sitting - Balance Support: Feet supported;No upper extremity supported Dynamic  Sitting - Level of Assistance: 6: Modified independent (Device/Increase time) Static Standing Balance Static Standing - Balance Support: Bilateral upper extremity supported Static Standing - Level of Assistance: 4: Min assist Dynamic Standing Balance Dynamic Standing - Balance Support: During functional activity Dynamic Standing - Level of Assistance: 3: Mod assist Extremity/Trunk Assessment RUE Assessment RUE Assessment: Exceptions to [facility] shoulder flexion approximately 0-120 degrees.  All other joints AROM WFLS with strength 4/5 throughout.  Slight arthritic changes noted in digits with amputation of the index finger PIP joint.  ) LUE Assessment LUE Assessment: Exceptions to [facility] LUE Strength LUE Overall Strength Comments: Pt demonstrates AROM shoulder flexion less than 70 degrees.  AAROM 0- 100 degrees with pain above 80 degrees noted.  Increased tightness noted in pectoral and internal rotators.  Pt with full AROM elbow flexion and extension with grip strength 4/5.  Noted left DIP amputation of the middle digit.     See Function Navigator for Current Functional Status.  Amaliya Whitelaw OTR/L 07/30/2017, 4:49 PM

## 2017-07-30 NOTE — Progress Notes (Signed)
Social Work Patient ID: Charles Vasquez, male   DOB: 1935-09-22, 82 y.o.   MRN: [id number] Awaiting insurance approval from [facility] for him to transfer tomorrow to [facility]. Await approval.

## 2017-07-30 NOTE — Progress Notes (Addendum)
Physical Therapy Note  Patient Details  Name: Charles Vasquez MRN: [id number] Date of Birth: 1935/11/21 Today's Date: 07/30/2017  [id number], 45 min individual tx Pain: none, per pt  Pt asleep in bed, but easily awakened.    tx focused on bed mobility and sitting balance to sit EOB and eat breakfast.  Rolling L and R with modified independence in flat bed, no rails.  R side lying> sit with min assist, due to difficulty bringing R elbow under him in order to push up.  Pt sat EOB, and stated that he was dizzy.  See vitals after 15 min; still c/o dizziness. Clydie Braun, RN informed.   Pt ate with R hand, taking extra time for chewing, and attempting to take large bites and put more food in his mouth before swallowing; min cues needed for safety.  Attempted sit> stand from regular height bed; pt with poor safety awareness of wt shifting.  Squat pivot to L, bed> w/c with min assist..  wc propulsion using bil hands, x 150' with 3 brief rest breaks.  Pt left resting in w/c with quick release belt applied, seat alarm set and all needs within reach.    See function navigator for current status.   [name] 07/30/2017, 7:51 AM

## 2017-07-30 NOTE — Procedures (Signed)
I have personally attended this patient's dialysis session.   Pre weight 58.7 kg New EDW will be around 57 Thigh AVG no issues For PRBC's X1  Camille Bal, MD [facility] [phone] Pager 07/30/2017, 4:56 PM

## 2017-07-31 ENCOUNTER — Ambulatory Visit: Payer: Medicare Other | Admitting: Gastroenterology

## 2017-07-31 DIAGNOSIS — M2550 Pain in unspecified joint: Secondary | ICD-10-CM | POA: Diagnosis not present

## 2017-07-31 DIAGNOSIS — Z09 Encounter for follow-up examination after completed treatment for conditions other than malignant neoplasm: Secondary | ICD-10-CM | POA: Diagnosis not present

## 2017-07-31 DIAGNOSIS — R2689 Other abnormalities of gait and mobility: Secondary | ICD-10-CM | POA: Diagnosis not present

## 2017-07-31 DIAGNOSIS — I70208 Unspecified atherosclerosis of native arteries of extremities, other extremity: Secondary | ICD-10-CM | POA: Diagnosis not present

## 2017-07-31 DIAGNOSIS — R269 Unspecified abnormalities of gait and mobility: Secondary | ICD-10-CM | POA: Diagnosis not present

## 2017-07-31 DIAGNOSIS — T8130XA Disruption of wound, unspecified, initial encounter: Secondary | ICD-10-CM | POA: Diagnosis not present

## 2017-07-31 DIAGNOSIS — E1122 Type 2 diabetes mellitus with diabetic chronic kidney disease: Secondary | ICD-10-CM | POA: Diagnosis not present

## 2017-07-31 DIAGNOSIS — Z9889 Other specified postprocedural states: Secondary | ICD-10-CM | POA: Diagnosis not present

## 2017-07-31 DIAGNOSIS — Z794 Long term (current) use of insulin: Secondary | ICD-10-CM | POA: Diagnosis not present

## 2017-07-31 DIAGNOSIS — E43 Unspecified severe protein-calorie malnutrition: Secondary | ICD-10-CM | POA: Diagnosis not present

## 2017-07-31 DIAGNOSIS — Z7401 Bed confinement status: Secondary | ICD-10-CM | POA: Diagnosis not present

## 2017-07-31 DIAGNOSIS — K5900 Constipation, unspecified: Secondary | ICD-10-CM | POA: Diagnosis not present

## 2017-07-31 DIAGNOSIS — I120 Hypertensive chronic kidney disease with stage 5 chronic kidney disease or end stage renal disease: Secondary | ICD-10-CM | POA: Diagnosis not present

## 2017-07-31 DIAGNOSIS — D649 Anemia, unspecified: Secondary | ICD-10-CM | POA: Diagnosis not present

## 2017-07-31 DIAGNOSIS — D638 Anemia in other chronic diseases classified elsewhere: Secondary | ICD-10-CM | POA: Diagnosis not present

## 2017-07-31 DIAGNOSIS — L03818 Cellulitis of other sites: Secondary | ICD-10-CM | POA: Diagnosis not present

## 2017-07-31 DIAGNOSIS — I96 Gangrene, not elsewhere classified: Secondary | ICD-10-CM | POA: Diagnosis not present

## 2017-07-31 DIAGNOSIS — Z4781 Encounter for orthopedic aftercare following surgical amputation: Secondary | ICD-10-CM | POA: Diagnosis not present

## 2017-07-31 DIAGNOSIS — E1142 Type 2 diabetes mellitus with diabetic polyneuropathy: Secondary | ICD-10-CM | POA: Diagnosis not present

## 2017-07-31 DIAGNOSIS — S88011S Complete traumatic amputation at knee level, right lower leg, sequela: Secondary | ICD-10-CM | POA: Diagnosis not present

## 2017-07-31 DIAGNOSIS — D6489 Other specified anemias: Secondary | ICD-10-CM | POA: Diagnosis not present

## 2017-07-31 DIAGNOSIS — N186 End stage renal disease: Secondary | ICD-10-CM | POA: Diagnosis not present

## 2017-07-31 DIAGNOSIS — M79641 Pain in right hand: Secondary | ICD-10-CM | POA: Diagnosis not present

## 2017-07-31 DIAGNOSIS — Z8249 Family history of ischemic heart disease and other diseases of the circulatory system: Secondary | ICD-10-CM | POA: Diagnosis not present

## 2017-07-31 DIAGNOSIS — Z833 Family history of diabetes mellitus: Secondary | ICD-10-CM | POA: Diagnosis not present

## 2017-07-31 DIAGNOSIS — D62 Acute posthemorrhagic anemia: Secondary | ICD-10-CM | POA: Diagnosis not present

## 2017-07-31 DIAGNOSIS — Z992 Dependence on renal dialysis: Secondary | ICD-10-CM | POA: Diagnosis not present

## 2017-07-31 DIAGNOSIS — S88111A Complete traumatic amputation at level between knee and ankle, right lower leg, initial encounter: Secondary | ICD-10-CM | POA: Diagnosis not present

## 2017-07-31 DIAGNOSIS — M79603 Pain in arm, unspecified: Secondary | ICD-10-CM | POA: Diagnosis not present

## 2017-07-31 DIAGNOSIS — I10 Essential (primary) hypertension: Secondary | ICD-10-CM | POA: Diagnosis not present

## 2017-07-31 DIAGNOSIS — I70263 Atherosclerosis of native arteries of extremities with gangrene, bilateral legs: Secondary | ICD-10-CM | POA: Diagnosis not present

## 2017-07-31 DIAGNOSIS — Z89611 Acquired absence of right leg above knee: Secondary | ICD-10-CM | POA: Diagnosis not present

## 2017-07-31 DIAGNOSIS — E119 Type 2 diabetes mellitus without complications: Secondary | ICD-10-CM | POA: Diagnosis not present

## 2017-07-31 DIAGNOSIS — K219 Gastro-esophageal reflux disease without esophagitis: Secondary | ICD-10-CM | POA: Diagnosis not present

## 2017-07-31 DIAGNOSIS — I87309 Chronic venous hypertension (idiopathic) without complications of unspecified lower extremity: Secondary | ICD-10-CM | POA: Diagnosis not present

## 2017-07-31 DIAGNOSIS — R6889 Other general symptoms and signs: Secondary | ICD-10-CM | POA: Diagnosis not present

## 2017-07-31 DIAGNOSIS — E1169 Type 2 diabetes mellitus with other specified complication: Secondary | ICD-10-CM | POA: Diagnosis not present

## 2017-07-31 DIAGNOSIS — R0989 Other specified symptoms and signs involving the circulatory and respiratory systems: Secondary | ICD-10-CM | POA: Diagnosis not present

## 2017-07-31 DIAGNOSIS — I252 Old myocardial infarction: Secondary | ICD-10-CM | POA: Diagnosis not present

## 2017-07-31 DIAGNOSIS — M6281 Muscle weakness (generalized): Secondary | ICD-10-CM | POA: Diagnosis not present

## 2017-07-31 DIAGNOSIS — Z89511 Acquired absence of right leg below knee: Secondary | ICD-10-CM | POA: Diagnosis not present

## 2017-07-31 DIAGNOSIS — Z8673 Personal history of transient ischemic attack (TIA), and cerebral infarction without residual deficits: Secondary | ICD-10-CM | POA: Diagnosis not present

## 2017-07-31 DIAGNOSIS — M25512 Pain in left shoulder: Secondary | ICD-10-CM | POA: Diagnosis not present

## 2017-07-31 LAB — BPAM RBC
Blood Product Expiration Date: 201906172359
ISSUE DATE / TIME: 201905221626
Unit Type and Rh: 5100

## 2017-07-31 LAB — GLUCOSE, CAPILLARY: Glucose-Capillary: 217 mg/dL — ABNORMAL HIGH (ref 65–99)

## 2017-07-31 LAB — RENAL FUNCTION PANEL
ALBUMIN: 1.9 g/dL — AB (ref 3.5–5.0)
ANION GAP: 13 (ref 5–15)
BUN: 38 mg/dL — AB (ref 6–20)
CO2: 27 mmol/L (ref 22–32)
Calcium: 8 mg/dL — ABNORMAL LOW (ref 8.9–10.3)
Chloride: 96 mmol/L — ABNORMAL LOW (ref 101–111)
Creatinine, Ser: 5.27 mg/dL — ABNORMAL HIGH (ref 0.61–1.24)
GFR calc Af Amer: 11 mL/min — ABNORMAL LOW (ref >60)
GFR, EST NON AFRICAN AMERICAN: 9 mL/min — AB (ref >60)
Glucose, Bld: 227 mg/dL — ABNORMAL HIGH (ref 65–99)
PHOSPHORUS: 2.7 mg/dL (ref 2.5–4.6)
POTASSIUM: 3.5 mmol/L (ref 3.5–5.1)
Sodium: 136 mmol/L (ref 135–145)

## 2017-07-31 LAB — CBC
HEMATOCRIT: 32.7 % — AB (ref 39.0–52.0)
HEMOGLOBIN: 10 g/dL — AB (ref 13.0–17.0)
MCH: 27.5 pg (ref 26.0–34.0)
MCHC: 30.6 g/dL (ref 30.0–36.0)
MCV: 89.8 fL (ref 78.0–100.0)
Platelets: 213 10*3/uL (ref 150–400)
RBC: 3.64 MIL/uL — AB (ref 4.22–5.81)
RDW: 15.5 % (ref 11.5–15.5)
WBC: 10.2 10*3/uL (ref 4.0–10.5)

## 2017-07-31 LAB — TYPE AND SCREEN
ABO/RH(D): O POS
ANTIBODY SCREEN: NEGATIVE
Unit division: 0

## 2017-07-31 NOTE — Plan of Care (Signed)
DC to snf

## 2017-07-31 NOTE — Progress Notes (Signed)
Called report to [facility]

## 2017-07-31 NOTE — Progress Notes (Signed)
[place] PHYSICAL MEDICINE & REHABILITATION     PROGRESS NOTE  Subjective/Complaints:  Pt seen sitting up in bed this AM, being fed breakfast.  He states he is ready to go.    ROS: Denies CP, SOB, N/V/D  Objective: Vital Signs: Blood pressure (!) 140/52, pulse 82, temperature 97.8 F (36.6 C), temperature source Oral, resp. rate 18, weight 57 kg (125 lb 10.6 oz), SpO2 94 %. No results found. Recent Labs    07/28/17 2357 07/31/17 0714  WBC 9.9 10.2  HGB 7.0* 10.0*  HCT 23.0* 32.7*  PLT 237 213   Recent Labs    07/28/17 2357 07/31/17 0713  NA 130* 136  K 4.5 3.5  CL 92* 96*  GLUCOSE 99 227*  BUN 82* 38*  CREATININE 9.73* 5.27*  CALCIUM 7.7* 8.0*   CBG (last 3)  Recent Labs    07/30/17 1859 07/30/17 2151 07/31/17 0644  GLUCAP 127* 198* 217*    Wt Readings from Last 3 Encounters:  07/30/17 57 kg (125 lb 10.6 oz)  07/17/17 55.4 kg (122 lb 2.2 oz)  07/11/17 59 kg (130 lb)    Physical Exam:  BP (!) 140/52 (BP Location: Left Arm)   Pulse 82   Temp 97.8 F (36.6 C) (Oral)   Resp 18   Wt 57 kg (125 lb 10.6 oz)   SpO2 94%   BMI 19.68 kg/m  Constitutional: No distress . Vital signs reviewed. HENT: Normocephalic.  Atraumatic. Eyes: EOMI. No discharge. Cardiovascular: RRR. No JVD. Respiratory: CTA Bilaterally.  Normal effort. GI: BS +. Non-distended. MSK: Minimal TTP in stump, improving Right BKA edema and tenderness.  RUE: amputation of 2nd digit at PIP LLE: amputation digits  Neurological: He isalert, remains confused at times. Motor: B/l UE, LLE, Right hip flexion 4+-5/5 proximal to distal (unchanged) Skin: BKA with dressing c/d/i  Assessment/Plan: 1. Functional deficits secondary to right BKA which require 3+ hours per day of interdisciplinary therapy in a comprehensive inpatient rehab setting. Physiatrist is providing close team supervision and 24 hour management of active medical problems listed below. Physiatrist and rehab team continue to  assess barriers to discharge/monitor patient progress toward functional and medical goals.  Function:  Bathing Bathing position   Position: Shower  Bathing parts Body parts bathed by patient: Right arm, Left arm, Chest, Abdomen, Front perineal area, Right upper leg, Left upper leg, Buttocks, Left lower leg Body parts bathed by helper: Back, Right lower leg  Bathing assist Assist Level: Touching or steadying assistance(Pt > 75%)      Upper Body Dressing/Undressing Upper body dressing   What is the patient wearing?: Pull over shirt/dress     Pull over shirt/dress - Perfomed by patient: Thread/unthread right sleeve, Thread/unthread left sleeve, Put head through opening, Pull shirt over trunk Pull over shirt/dress - Perfomed by helper: Thread/unthread right sleeve, Thread/unthread left sleeve, Put head through opening, Pull shirt over trunk        Upper body assist Assist Level: Supervision or verbal cues      Lower Body Dressing/Undressing Lower body dressing   What is the patient wearing?: Pants, Socks, Shoes     Pants- Performed by patient: Thread/unthread right pants leg, Thread/unthread left pants leg Pants- Performed by helper: Pull pants up/down   Non-skid slipper socks- Performed by helper: Don/doff left sock(right sock NA secondary to amputation)     Shoes - Performed by patient: Don/doff left shoe Shoes - Performed by helper: Don/doff left shoe  Lower body assist Assist for lower body dressing: Touching or steadying assistance (Pt > 75%)      Toileting Toileting   Toileting steps completed by patient: Performs perineal hygiene Toileting steps completed by helper: Adjust clothing prior to toileting, Adjust clothing after toileting Toileting Assistive Devices: Grab bar or rail  Toileting assist Assist level: Touching or steadying assistance (Pt.75%)   Transfers Chair/bed transfer   Chair/bed transfer method: Squat pivot Chair/bed transfer assist  level: Touching or steadying assistance (Pt > 75%) Chair/bed transfer assistive device: Armrests     Locomotion Ambulation Ambulation activity did not occur: Safety/medical concerns         Wheelchair   Type: Manual Max wheelchair distance: 150 Assist Level: Supervision or verbal cues  Cognition Comprehension Comprehension assist level: Understands basic 90% of the time/cues < 10% of the time  Expression Expression assist level: Expresses basic 90% of the time/requires cueing < 10% of the time.  Social Interaction Social Interaction assist level: Interacts appropriately 75 - 89% of the time - Needs redirection for appropriate language or to initiate interaction.  Problem Solving Problem solving assist level: Solves basic 50 - 74% of the time/requires cueing 25 - 49% of the time  Memory Memory assist level: Recognizes or recalls 50 - 74% of the time/requires cueing 25 - 49% of the time    Medical Problem List and Plan: 1.Decreased functional mobilitysecondary to right BKA 07/15/2017  D/c today  Will see patient for hospital follow-up in one month 2. DVT Prophylaxis/Anticoagulation: Subcutaneous heparin. Monitor for any bleeding episodes 3. Pain Management:Ultram as needed  -may have left RTC tendonitis/bursitis   Left shoulder x-ray reviewed, relatively unremarkable shoulder joint   Voltaren gel ordered on 5/15 with good benefit   Lidoderm patch ordered on 5/20 with good benefit   Will consider further intervention as outpt  -Mild right lower limb phantom pain   -Observe for now 4. Mood:Provide emotional support 5. Neuropsych: This patientis?fully capable of making decisions on hisown behalf. 6. Skin/Wound Care:Routine skin checks 7. Fluids/Electrolytes/Nutrition:Routine in and outs  8.Acute on chronic anemia. Continue Niferex  Hb 10.0 on 5/23  Cont to monitor 9.End-stage renal disease with hemodialysis. Follow-up renal services 10.Diabetes mellitus with  peripheral neuropathy. Hemoglobin A1c 7.1.   Lantus 6 units with HD, increased to 8 units on 5/16  Lantus 4 units daily d/ced on 5/14   CBG (last 3)  Recent Labs    07/30/17 1859 07/30/17 2151 07/31/17 0644  GLUCAP 127* 198* 217*   Labile on 5/23  Will require ambulatory monitoring 11.ID. Blood cultures Proteus/strep.   Rocephin course to be completed 5/16 12.Hypertension.   Losartan 25 started on 5/13, increased to 5/15  Labile on 5/23  Will require ambulatory monitoring Vitals:   07/30/17 2212 07/31/17 0548  BP: (!) 141/53 (!) 140/52  Pulse: 79 82  Resp: 18 18  Temp: 98.9 F (37.2 C) 97.8 F (36.6 C)  SpO2: 99% 94%   13.Constipation. Laxative assistance 14. Leukocytosis: Resolved  Afebrile   Labs with HD 15.  Insomnia   resolved  LOS (Days) 14 A FACE TO FACE EVALUATION WAS PERFORMED  Charles Vasquez 07/31/2017 10:56 AM

## 2017-07-31 NOTE — Progress Notes (Signed)
Social Work  Discharge Note  The overall goal for the admission was met for:   Discharge location: No-[facility]-SNF  Length of Stay: No-14 DAYS  Discharge activity level: No-MIN ASSIST LEVEL  Home/community participation: Yes  Services provided included: MD, RD, PT, OT, RN, CM, Pharmacy, Neuropsych and SW  Financial Services: Private Insurance: [facility]  Follow-up services arranged: Other: SNF  Comments (or additional information):PT [place], SISTER AND GIRLFRIEND ARE VERY INVOLVED AND VISIT DAILY  Patient/Family verbalized understanding of follow-up arrangements: Yes  Individual responsible for coordination of the follow-up plan: SELF, [name]-SISTER & DELORES-GIRLFRIEND  Confirmed correct DME delivered: Elease Hashimoto 07/31/2017    Elease Hashimoto

## 2017-07-31 NOTE — Progress Notes (Signed)
Social Work Patient ID: Charles Vasquez, male   DOB: 10/04/1935, 82 y.o.   MRN: [id number] received approval from [facility] will go ahead and transfer pt to [facility] this am. Pt and family aware of plan and time around 10:30 am

## 2017-08-01 DIAGNOSIS — D6489 Other specified anemias: Secondary | ICD-10-CM | POA: Diagnosis not present

## 2017-08-01 DIAGNOSIS — Z992 Dependence on renal dialysis: Secondary | ICD-10-CM | POA: Diagnosis not present

## 2017-08-01 DIAGNOSIS — N186 End stage renal disease: Secondary | ICD-10-CM | POA: Diagnosis not present

## 2017-08-01 DIAGNOSIS — S88011S Complete traumatic amputation at knee level, right lower leg, sequela: Secondary | ICD-10-CM | POA: Diagnosis not present

## 2017-08-01 DIAGNOSIS — E1169 Type 2 diabetes mellitus with other specified complication: Secondary | ICD-10-CM | POA: Diagnosis not present

## 2017-08-04 DIAGNOSIS — Z992 Dependence on renal dialysis: Secondary | ICD-10-CM | POA: Diagnosis not present

## 2017-08-04 DIAGNOSIS — N186 End stage renal disease: Secondary | ICD-10-CM | POA: Diagnosis not present

## 2017-08-06 DIAGNOSIS — Z992 Dependence on renal dialysis: Secondary | ICD-10-CM | POA: Diagnosis not present

## 2017-08-06 DIAGNOSIS — N186 End stage renal disease: Secondary | ICD-10-CM | POA: Diagnosis not present

## 2017-08-08 DIAGNOSIS — N186 End stage renal disease: Secondary | ICD-10-CM | POA: Diagnosis not present

## 2017-08-08 DIAGNOSIS — Z992 Dependence on renal dialysis: Secondary | ICD-10-CM | POA: Diagnosis not present

## 2017-08-11 DIAGNOSIS — N186 End stage renal disease: Secondary | ICD-10-CM | POA: Diagnosis not present

## 2017-08-11 DIAGNOSIS — Z992 Dependence on renal dialysis: Secondary | ICD-10-CM | POA: Diagnosis not present

## 2017-08-12 ENCOUNTER — Encounter: Payer: Self-pay | Admitting: Physician Assistant

## 2017-08-12 ENCOUNTER — Ambulatory Visit (INDEPENDENT_AMBULATORY_CARE_PROVIDER_SITE_OTHER): Payer: Medicare Other | Admitting: Physician Assistant

## 2017-08-12 DIAGNOSIS — I739 Peripheral vascular disease, unspecified: Secondary | ICD-10-CM

## 2017-08-12 NOTE — Progress Notes (Signed)
POST OPERATIVE OFFICE NOTE    CC:  F/u for surgery  HPI:  This is a 82 y.o. male who is s/p Right BKA amputation on 07/15/2017 by [name].  He comes in today for a f/u visit and removal of staples.    He denise pain, fever and chills.  He does have a new coomplaint of right middle finger non healing wound with tenderness to palpation.  He injured it about a month ago.  He had a previous non healing finger wound on the right pointer finger that resulted in distal phalanx amputation. By [name].    Allergies  Allergen Reactions  . Lisinopril Other (See Comments)    HYPERKALEMIA RENAL DYSFUNCTION PROGRESSION    Current Outpatient Medications  Medication Sig Dispense Refill  . Darbepoetin Alfa (ARANESP) 200 MCG/0.4ML SOSY injection Inject 0.4 mLs (200 mcg total) into the vein every Monday with hemodialysis. 1.68 mL   . diclofenac sodium (VOLTAREN) 1 % GEL Apply 2 g topically 4 (four) times daily.    Marland Kitchen dicyclomine (BENTYL) 10 MG capsule 1 PO 30 MINUTES PRIOR TO BREAKFAST AND LUNCH (Patient taking differently: Take 10 mg by mouth 2 (two) times daily. ) 62 capsule 11  . doxercalciferol (HECTOROL) 4 MCG/2ML injection Inject 1 mL (2 mcg total) into the vein every Monday, Wednesday, and Friday with hemodialysis. 2 mL   . insulin glargine (LANTUS) 100 UNIT/ML injection Inject 0.08 mLs (8 Units total) into the skin every Monday, Wednesday, and Friday at 8 PM. 10 mL 11  . iron polysaccharides (NIFEREX) 150 MG capsule Take 1 capsule (150 mg total) by mouth 2 (two) times daily.    Marland Kitchen lidocaine (LIDODERM) 5 % Place 1 patch onto the skin daily. Remove & Discard patch within 12 hours or as directed by MD 3 patch 0  . losartan (COZAAR) 50 MG tablet Take 50 mg by mouth every evening.     . multivitamin (RENA-VIT) TABS tablet Take 1 tablet by mouth daily.  0  . pantoprazole (PROTONIX) 40 MG tablet Take 1 tablet (40 mg total) by mouth 2 (two) times daily with a meal.    . sevelamer carbonate (RENVELA) 800 MG  tablet Take 2 tablets (1,600 mg total) by mouth 3 (three) times daily with meals.    . traMADol (ULTRAM) 50 MG tablet Take 1 tablet (50 mg total) by mouth every 6 (six) hours as needed. 6 tablet 0   No current facility-administered medications for this visit.      ROS:  See HPI  Physical Exam:  There were no vitals filed for this visit.  Incision:  Healing well, staples removed in office today.  Superficial separation lateral stump.  No active drainage, stump NTTP and no fluctuance.  Steri strips and dry dressing applied to right stump.  Right middle finger tip with scab, erythema to nail bed with cap refill < 3 sec.  Tender to palpation with minimal edema in the finger.  Active range of motion intact, with pain.  Non palpable radial pulse in the right UE.    Assessment/Plan:  This is a 82 y.o. male who is s/p: Right BKA healing well with minimal superficial separation lateral incision after staple removed.  Steri strips applied.  He has had a previous non healing wound on the right pointer finger requiring amputation to the distal phalanx.  I'll order a right UE arterial duplex and have him follow up.  I also want to re exam the right BKA stump  to make sure he heals well.   Continue current rehab and recommend keeping his skin moisturized with lotion daily after bathing.  His sister and brother were present and I asked them to return sooner if he has any healing problems with the BKA.   Mosetta Pigeon , PA-C [facility] [phone]

## 2017-08-13 ENCOUNTER — Encounter: Payer: Medicare Other | Admitting: Vascular Surgery

## 2017-08-13 DIAGNOSIS — Z992 Dependence on renal dialysis: Secondary | ICD-10-CM | POA: Diagnosis not present

## 2017-08-13 DIAGNOSIS — N186 End stage renal disease: Secondary | ICD-10-CM | POA: Diagnosis not present

## 2017-08-15 DIAGNOSIS — Z992 Dependence on renal dialysis: Secondary | ICD-10-CM | POA: Diagnosis not present

## 2017-08-15 DIAGNOSIS — N186 End stage renal disease: Secondary | ICD-10-CM | POA: Diagnosis not present

## 2017-08-18 DIAGNOSIS — Z992 Dependence on renal dialysis: Secondary | ICD-10-CM | POA: Diagnosis not present

## 2017-08-18 DIAGNOSIS — N186 End stage renal disease: Secondary | ICD-10-CM | POA: Diagnosis not present

## 2017-08-20 DIAGNOSIS — Z992 Dependence on renal dialysis: Secondary | ICD-10-CM | POA: Diagnosis not present

## 2017-08-20 DIAGNOSIS — N186 End stage renal disease: Secondary | ICD-10-CM | POA: Diagnosis not present

## 2017-08-22 DIAGNOSIS — Z992 Dependence on renal dialysis: Secondary | ICD-10-CM | POA: Diagnosis not present

## 2017-08-22 DIAGNOSIS — N186 End stage renal disease: Secondary | ICD-10-CM | POA: Diagnosis not present

## 2017-08-25 DIAGNOSIS — Z992 Dependence on renal dialysis: Secondary | ICD-10-CM | POA: Diagnosis not present

## 2017-08-25 DIAGNOSIS — N186 End stage renal disease: Secondary | ICD-10-CM | POA: Diagnosis not present

## 2017-08-26 ENCOUNTER — Other Ambulatory Visit: Payer: Self-pay

## 2017-08-26 DIAGNOSIS — I87309 Chronic venous hypertension (idiopathic) without complications of unspecified lower extremity: Secondary | ICD-10-CM

## 2017-08-27 DIAGNOSIS — Z992 Dependence on renal dialysis: Secondary | ICD-10-CM | POA: Diagnosis not present

## 2017-08-27 DIAGNOSIS — N186 End stage renal disease: Secondary | ICD-10-CM | POA: Diagnosis not present

## 2017-08-28 ENCOUNTER — Ambulatory Visit (HOSPITAL_COMMUNITY)
Admission: RE | Admit: 2017-08-28 | Discharge: 2017-08-28 | Disposition: A | Discharge status: Home / Self Care | Payer: Medicare Other | Source: Ambulatory Visit | Attending: Family | Admitting: Family

## 2017-08-28 DIAGNOSIS — I70208 Unspecified atherosclerosis of native arteries of extremities, other extremity: Secondary | ICD-10-CM | POA: Diagnosis not present

## 2017-08-28 DIAGNOSIS — I87309 Chronic venous hypertension (idiopathic) without complications of unspecified lower extremity: Secondary | ICD-10-CM | POA: Diagnosis not present

## 2017-08-28 DIAGNOSIS — M79603 Pain in arm, unspecified: Secondary | ICD-10-CM | POA: Insufficient documentation

## 2017-08-29 DIAGNOSIS — N186 End stage renal disease: Secondary | ICD-10-CM | POA: Diagnosis not present

## 2017-08-29 DIAGNOSIS — Z992 Dependence on renal dialysis: Secondary | ICD-10-CM | POA: Diagnosis not present

## 2017-08-30 DIAGNOSIS — Z89611 Acquired absence of right leg above knee: Secondary | ICD-10-CM | POA: Diagnosis not present

## 2017-08-30 DIAGNOSIS — T8130XA Disruption of wound, unspecified, initial encounter: Secondary | ICD-10-CM | POA: Diagnosis not present

## 2017-08-30 DIAGNOSIS — L03818 Cellulitis of other sites: Secondary | ICD-10-CM | POA: Diagnosis not present

## 2017-09-01 DIAGNOSIS — N186 End stage renal disease: Secondary | ICD-10-CM | POA: Diagnosis not present

## 2017-09-01 DIAGNOSIS — Z992 Dependence on renal dialysis: Secondary | ICD-10-CM | POA: Diagnosis not present

## 2017-09-01 NOTE — Progress Notes (Signed)
    Postoperative Visit    History of Present Illness   Charles Vasquez is a 82 y.o. male who presents for postoperative follow-up for: bilateral below-the-knee ampuation.(Date: 07/17/17).  On follow up on 08/12/17, the patient has superficial separation in B BKA.  He also had ischemic appearing R middle finger with no palpable pulses in R wrist.  Pt presents for follow up on BKA and R finger   For VQI Use Only   PRE-ADM LIVING: Nursing home  AMB STATUS: Wheelchair   Physical Examination   Vitals:   09/03/17 0835 09/03/17 0836  BP: (!) 161/73 (!) 160/71  Pulse: 72   Resp: 18   Temp: (!) 97.4 F (36.3 C)   SpO2: 99%     BLE: Stump incision is healed medially with lateral superficial separation and some ischemic skin  R hand: ischemic changes at tips of R 2nd distal phalange and 3rd phalange, the R 2nd distal phalange demonstrate chronic tissue loss and auto-amputation  L foot: healed prior TMA 1-3, ischemic L 3rd toe   Medical Decision Making   Charles Vasquez is a 82 y.o. male who presents s/p bilateral below-the-knee amputation, R 2nd and 3rd finger gangrene, L 4th toe gangrene   Non-invasive studies on R arm and L leg suggest adequate blood flow, which makes the gangrene present due to digital artery disease.  R BKA is still salvageable: wash stump daily with soap and water, wet-to-dry dressing changes  Pt referred to Hand Surgery for R hand gangrene.  Pt referred to Dr. Lajoyce Corners for L foot gangrene.   [name], MD, FACS [facility] [place] Office: [phone] Pager: [phone]

## 2017-09-03 ENCOUNTER — Encounter: Payer: Self-pay | Admitting: Vascular Surgery

## 2017-09-03 ENCOUNTER — Other Ambulatory Visit: Payer: Self-pay

## 2017-09-03 ENCOUNTER — Ambulatory Visit (INDEPENDENT_AMBULATORY_CARE_PROVIDER_SITE_OTHER): Payer: Medicare Other | Admitting: Vascular Surgery

## 2017-09-03 VITALS — BP 160/71 | HR 72 | Temp 97.4°F | Resp 18 | Ht 67.0 in | Wt 126.0 lb

## 2017-09-03 DIAGNOSIS — S88111A Complete traumatic amputation at level between knee and ankle, right lower leg, initial encounter: Secondary | ICD-10-CM

## 2017-09-03 DIAGNOSIS — N186 End stage renal disease: Secondary | ICD-10-CM | POA: Diagnosis not present

## 2017-09-03 DIAGNOSIS — Z992 Dependence on renal dialysis: Secondary | ICD-10-CM | POA: Diagnosis not present

## 2017-09-03 NOTE — Patient Outreach (Signed)
[facility]) Care Management  09/03/2017  Charles Vasquez 10-Jul-1935 [id number]   Medication Adherence call to Mr. Charles Vasquez patient did not answer patient is due on Losartan 50 mg [facility] said patient has not pick up since March.Mr. Charles Vasquez is showing past due under [facility] Ins.  Lillia Abed CPhT Pharmacy Technician [facility] Management Direct Dial [phone]  Fax [phone] [email or web address]

## 2017-09-05 ENCOUNTER — Inpatient Hospital Stay: Payer: Medicare Other | Admitting: Physical Medicine & Rehabilitation

## 2017-09-05 DIAGNOSIS — N186 End stage renal disease: Secondary | ICD-10-CM | POA: Diagnosis not present

## 2017-09-05 DIAGNOSIS — Z992 Dependence on renal dialysis: Secondary | ICD-10-CM | POA: Diagnosis not present

## 2017-09-07 DIAGNOSIS — Z992 Dependence on renal dialysis: Secondary | ICD-10-CM | POA: Diagnosis not present

## 2017-09-07 DIAGNOSIS — N186 End stage renal disease: Secondary | ICD-10-CM | POA: Diagnosis not present

## 2017-09-08 DIAGNOSIS — Z992 Dependence on renal dialysis: Secondary | ICD-10-CM | POA: Diagnosis not present

## 2017-09-08 DIAGNOSIS — N186 End stage renal disease: Secondary | ICD-10-CM | POA: Diagnosis not present

## 2017-09-10 DIAGNOSIS — N186 End stage renal disease: Secondary | ICD-10-CM | POA: Diagnosis not present

## 2017-09-10 DIAGNOSIS — Z992 Dependence on renal dialysis: Secondary | ICD-10-CM | POA: Diagnosis not present

## 2017-09-12 DIAGNOSIS — N186 End stage renal disease: Secondary | ICD-10-CM | POA: Diagnosis not present

## 2017-09-12 DIAGNOSIS — Z992 Dependence on renal dialysis: Secondary | ICD-10-CM | POA: Diagnosis not present

## 2017-09-15 ENCOUNTER — Ambulatory Visit (INDEPENDENT_AMBULATORY_CARE_PROVIDER_SITE_OTHER): Payer: Medicare Other | Admitting: Orthopedic Surgery

## 2017-09-15 DIAGNOSIS — N186 End stage renal disease: Secondary | ICD-10-CM | POA: Diagnosis not present

## 2017-09-15 DIAGNOSIS — Z992 Dependence on renal dialysis: Secondary | ICD-10-CM | POA: Diagnosis not present

## 2017-09-16 ENCOUNTER — Encounter (INDEPENDENT_AMBULATORY_CARE_PROVIDER_SITE_OTHER): Payer: Self-pay | Admitting: Orthopedic Surgery

## 2017-09-16 ENCOUNTER — Ambulatory Visit (INDEPENDENT_AMBULATORY_CARE_PROVIDER_SITE_OTHER): Payer: Medicare Other | Admitting: Orthopedic Surgery

## 2017-09-16 VITALS — Ht 67.0 in | Wt 126.0 lb

## 2017-09-16 DIAGNOSIS — E43 Unspecified severe protein-calorie malnutrition: Secondary | ICD-10-CM | POA: Diagnosis not present

## 2017-09-16 DIAGNOSIS — I70263 Atherosclerosis of native arteries of extremities with gangrene, bilateral legs: Secondary | ICD-10-CM | POA: Diagnosis not present

## 2017-09-16 MED ORDER — PENTOXIFYLLINE ER 400 MG PO TBCR: 400.0000 mg | EXTENDED_RELEASE_TABLET | Freq: Three times a day (TID) | ORAL | 3 refills | Status: DC

## 2017-09-16 NOTE — Progress Notes (Signed)
Office Visit Note   Patient: Charles Vasquez           Date of Birth: Jul 21, 1935           MRN: [id number] Visit Date: 09/16/2017              Requested by: Toma Deiters, MD [address] [place], [place] [address] PCP: Toma Deiters, MD  Chief Complaint  Patient presents with  . Right Hand - Wound Check  . Left Foot - Wound Check      HPI: Patient is a 82 year old gentleman skilled nursing resident who has been seen by vascular vein surgery with multiple vascular interventions including a transtibial amputation on the right.  Patient is seen for gangrene of the left lower extremity ischemic ulceration to the right transtibial amputation as well as right index and long finger dry gangrene.  Assessment & Plan: Visit Diagnoses:  1. 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 Severe protein-calorie malnutrition (HCC)     Plan: Discussed with the patient and the family treatment options including amputations versus continued observation.  Will start patient on Trental 400 mg 3 times a day and continue with dressing changes to the right transtibial amputation and dry dressing changes to the left heel with a foam boots to unload pressure from the heel.  Discussed that if conservative treatment does not work patient would require an above-the-knee amputation on the right and amputation of the index and long finger of the right hand.  Patient also may require a left above-knee amputation as well.  Follow-Up Instructions: Return in about 1 month (around 10/14/2017).   Ortho Exam  Patient is alert, oriented, no adenopathy, well-dressed, normal affect, normal respiratory effort. Examination patient is thin and cachectic with severe protein caloric malnutrition and a hemoglobin A1c between 7 and 9.  Examination of the right hand he is status post partial ray amputation of the right index finger with dry gangrenous changes to the tip of the amputated  digit.  Patient also has dry gangrenous changes to the right long finger.  A Doppler was used and patient has a dampened monophasic radial and ulnar pulse for the right upper extremity.  Semination of the right transtibial amputation patient has approximately 50% granulation tissue 50% fibrinous exudate with a thin gangrenous ulcer around the transtibial amputation residual limb.  There is no cellulitis no odor no exposed bone or tendon.  Examination of the left foot patient is status post amputation of the first 3 rays with dry gangrenous changes to the fourth toe.  Patient also has dry gangrenous changes to the left heel.  Doppler was used and patient has a dampened monophasic dorsalis pedis pulse on the left.  Patient has severe protein caloric malnutrition with a albumin of 1.9.  Imaging: No results found. No images are attached to the encounter.  Labs: Lab Results  Component Value Date   HGBA1C 7.1 (H) 12/24/2016   HGBA1C 8.8 (H) 08/10/2016   HGBA1C 7.5 11/10/2008   ESRSEDRATE 110 (H) 07/12/2017   CRP 21.4 (H) 07/12/2017   REPTSTATUS 07/17/2017 FINAL 07/12/2017   GRAMSTAIN  08/04/2016    RARE WBC PRESENT, PREDOMINANTLY PMN FEW GRAM NEGATIVE RODS FEW GRAM NEGATIVE DIPLOCOCCI RARE GRAM POSITIVE COCCI IN CLUSTERS    CULT  07/12/2017    NO GROWTH 5 DAYS Performed at [facility], [address] [place], [place] [address]    [name]  PROTEUS SPECIES 07/12/2017   LABORGA STREPTOCOCCUS PARASANGUINIS 07/12/2017     Lab Results  Component Value Date   ALBUMIN 1.9 (L) 07/31/2017   ALBUMIN 1.9 (L) 07/28/2017   ALBUMIN 2.0 (L) 07/25/2017    Body mass index is 19.73 kg/m.  Orders:  No orders of the defined types were placed in this encounter.  Meds ordered this encounter  Medications  . pentoxifylline (TRENTAL) 400 MG CR tablet    Sig: Take 1 tablet (400 mg total) by mouth 3 (three) times daily with meals.    Dispense:  90 tablet    Refill:  3     Procedures: No  procedures performed  Clinical Data: No additional findings.  ROS:  All other systems negative, except as noted in the HPI. Review of Systems  Objective: Vital Signs: Ht 5\' 7"  (1.702 m)   Wt 126 lb (57.2 kg)   BMI 19.73 kg/m   Specialty Comments:  No specialty comments available.  PMFS History: Patient Active Problem List   Diagnosis Date Noted  . Severe protein-calorie malnutrition (HCC) 09/16/2017  . 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 Labile blood pressure   . Chronic left shoulder pain   . Bacteremia   . Labile blood glucose   . Hypertensive crisis   . Hypoglycemia   . Leukocytosis   . Acute blood loss anemia   . Anemia of chronic disease   . ESRD on dialysis (HCC)   . Type 2 diabetes mellitus with peripheral neuropathy (HCC)   . Benign essential HTN   . 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 Pressure injury of skin 07/13/2017  . Diabetic foot infection (HCC) 07/12/2017  . CAD (coronary artery disease) 07/12/2017  . Type II diabetes mellitus with renal manifestations (HCC) 07/12/2017  . Wound of right foot   . Loose stools 05/20/2017  . Abdominal pain   . Loss of weight 03/18/2017  . Abnormal CT scan, colon 03/18/2017  . Melena 03/18/2017  . Periumbilical abdominal pain 03/18/2017  . ESRD (end stage renal disease) on dialysis (HCC) 10/22/2016  . Venous hypertension status post AVG procedure 08/10/2016  . Anasarca 08/04/2016  . Facial swelling 08/04/2016  . Effusion, other site 08/04/2016  . 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 Acute respiratory failure (HCC) 08/04/2016  . Hyperkalemia 08/04/2016  . Aftercare following surgery of the circulatory system, NEC-Right  AVGG 09/25/2012  . Other complications due to renal dialysis device, implant, and graft 05/01/2012  . End stage renal disease (HCC) 03/17/2012  . FATIGUE, ACUTE 08/22/2008  . HEMATURIA UNSPECIFIED  07/11/2008  . Diabetes (HCC) 01/19/2008  . Essential hypertension 01/19/2008  . MYOCARDIAL INFARCTION, HX OF 01/19/2008  . GERD 01/19/2008  . OSTEOARTHRITIS 01/19/2008  . CEREBROVASCULAR ACCIDENT, HX OF 01/19/2008   Past Medical History:  Diagnosis Date  . Anal pain   . AVF (arteriovenous fistula) (HCC) 05-13-2017 per pt currently AVF access used is left thigh   hx multiple AVF surgery's (previously left upper arm, bilateral thigh) last surgery -- right upper arm creation 11-26-2016  . ESRD (end stage renal disease) on dialysis [facility])    [facility] in [place], [place] on MWF   . History of CVA (cerebrovascular accident)    05-13-2017  per pt stated "thats what they told yrs ago"  per pt no residual  . Hyperlipidemia   . Hypertension   . Myocardial infarction (HCC)   . Stroke (  HCC)   . Type 2 diabetes mellitus treated with insulin (HCC)     Family History  Problem Relation Age of Onset  . Diabetes Mother   . Hypertension Mother   . AAA (abdominal aortic aneurysm) Mother   . Diabetes Father   . Hypertension Father     Past Surgical History:  Procedure Laterality Date  . ABDOMINAL AORTOGRAM W/LOWER EXTREMITY N/A 06/25/2017   Procedure: ABDOMINAL AORTOGRAM W/LOWER EXTREMITY;  Surgeon: Maeola Harman, MD;  Location: [facility] INVASIVE CV LAB;  Service: Cardiovascular;  Laterality: N/A;  . AMPUTATION Right 01/01/2017   Procedure: REVISION AMPUTATION RIGHT INDEX FINGER;  Surgeon: Dairl Ponder, MD;  Location: [facility] OR;  Service: Orthopedics;  Laterality: Right;  . AMPUTATION Right 07/15/2017   Procedure: AMPUTATION BELOW KNEE RIGHT;  Surgeon: [name], MD;  Location: [facility] OR;  Service: Vascular;  Laterality: Right;  . ARTERIOVENOUS GRAFT PLACEMENT    . ARTERIOVENOUS GRAFT PLACEMENT Left 10/22/2016   thigh  . AV FISTULA PLACEMENT  03/31/2012   Procedure: ARTERIOVENOUS (AV) FISTULA CREATION;  Surgeon: [name], MD;  Location: [facility] OR;  Service: Vascular;  Laterality: Right;   First stage Brachial vein transposition  . AV FISTULA PLACEMENT Right 08/25/2012   Procedure: INSERTION OF ARTERIOVENOUS (AV) GORE-TEX GRAFT ARM;  Surgeon: [name], MD;  Location: [facility] OR;  Service: Vascular;  Laterality: Right;  . AV FISTULA PLACEMENT Left 07/23/2016   Procedure: INSERTION OF ARTERIOVENOUS (AV) GORE-TEX GRAFT LEFT UPPER ARM;  Surgeon: [name], MD;  Location: [facility] OR;  Service: Vascular;  Laterality: Left;  . AV FISTULA PLACEMENT Left 10/22/2016   Procedure: INSERTION OF 4-26mm x 45cm  ARTERIOVENOUS (AV) GORE-TEX GRAFT THIGH-LEFT;  Surgeon: [name], MD;  Location: [facility] OR;  Service: Vascular;  Laterality: Left;  . CATARACT EXTRACTION W/ INTRAOCULAR LENS  IMPLANT, BILATERAL    . COLONOSCOPY N/A 03/25/2017   Dr. Darrick Penna: examined portion of ileum normal. Redundant left colon, stricture at anus   . ESOPHAGOGASTRODUODENOSCOPY N/A 03/25/2017   Dr. Darrick Penna: widely patent Schatzki ring at GE junction s/p dilation, atrophic gastritis, single duodenal AVM, non-bleeding  . INSERTION OF DIALYSIS CATHETER Left 05/05/2012   Procedure: INSERTION OF DIALYSIS CATHETER;  Surgeon: Chuck Hint, MD;  Location: [facility] OR;  Service: Vascular;  Laterality: Left;  . INSERTION OF DIALYSIS CATHETER Right 10/01/2016   Procedure: INSERTION OF DIALYSIS CATHETER- RIGHT FEMORAL;  Surgeon: [name], MD;  Location: [facility] OR;  Service: Vascular;  Laterality: Right;  . IR FLUORO GUIDE CV LINE RIGHT  06/10/2016  . IR GENERIC HISTORICAL  06/07/2016   IR US GUIDE VASC ACCESS RIGHT 06/07/2016 Oley Balm, MD [facility]-INTER[facility]  . IR THROMBECTOMY AV FISTULA W/THROMBOLYSIS/PTA INC/SHUNT/IMG RIGHT Right 06/07/2016  . IR US GUIDE VASC ACCESS RIGHT  06/10/2016  . LIGATION ARTERIOVENOUS GORTEX GRAFT Left 08/04/2016   Procedure: LIGATION ARTERIOVENOUS GORTEX GRAFT;  Surgeon: Larina Earthly, MD;  Location: [facility] OR;  Service: Vascular;  Laterality: Left;  . LIGATION ARTERIOVENOUS GORTEX GRAFT Right 11/26/2016   Procedure:  LIGATION OF RIGHT UPPER ARM ARTERIOVENOUS GORTEX GRAFT;  Surgeon: [name], MD;  Location: [facility] OR;  Service: Vascular;  Laterality: Right;  . LIGATION OF ARTERIOVENOUS  FISTULA Right 08/25/2012   Procedure: LIGATION OF ARTERIOVENOUS  FISTULA;  Surgeon: [name], MD;  Location: [facility] OR;  Service: Vascular;  Laterality: Right;  Ligation of right brachial vein transposition  . PERIPHERAL VASCULAR ATHERECTOMY  06/25/2017   Procedure:  PERIPHERAL VASCULAR ATHERECTOMY;  Surgeon: Maeola Harman, MD;  Location: [facility] INVASIVE CV LAB;  Service: Cardiovascular;;  Rt. PT  . REMOVAL OF A DIALYSIS CATHETER Right 05/05/2012   Procedure: REMOVAL OF A DIALYSIS CATHETER;  Surgeon: Chuck Hint, MD;  Location: [facility] OR;  Service: Vascular;  Laterality: Right;  . REMOVAL OF A DIALYSIS CATHETER Right 10/01/2016   Procedure: REMOVAL OF A DIALYSIS CATHETER-RIGHT UPPER CHEST;  Surgeon: [name], MD;  Location: [facility] OR;  Service: Vascular;  Laterality: Right;  . REMOVAL OF A DIALYSIS CATHETER Right 11/26/2016   Procedure: REMOVAL OF RIGHT FEMORAL TUNNEL DIALYSIS CATHETER;  Surgeon: [name], MD;  Location: [facility] OR;  Service: Vascular;  Laterality: Right;  . SPHINCTEROTOMY N/A 05/15/2017   Procedure: POSSIBLE SPHINCTEROTOMY (BOTOX);  Surgeon: Romie Levee, MD;  Location: [facility];  Service: General;  Laterality: N/A;  . TOE AMPUTATION  02/2007   left foot first 3 toes  . UPPER EXTREMITY VENOGRAPHY Bilateral 07/18/2016   Procedure: Bilateral Upper Extremity Venography;  Surgeon: [name], MD;  Location: [facility] INVASIVE CV LAB;  Service: Cardiovascular;  Laterality: Bilateral;   Social History   Occupational History  . Not on file  Tobacco Use  . Smoking status: Never Smoker  . Smokeless tobacco: Never Used  Substance and Sexual Activity  . Alcohol use: No  . Drug use: No  . Sexual activity: Yes

## 2017-09-17 DIAGNOSIS — Z992 Dependence on renal dialysis: Secondary | ICD-10-CM | POA: Diagnosis not present

## 2017-09-17 DIAGNOSIS — N186 End stage renal disease: Secondary | ICD-10-CM | POA: Diagnosis not present

## 2017-09-18 ENCOUNTER — Encounter: Payer: Self-pay | Admitting: Physical Medicine & Rehabilitation

## 2017-09-18 ENCOUNTER — Encounter: Payer: Medicare Other | Attending: Physical Medicine & Rehabilitation | Admitting: Physical Medicine & Rehabilitation

## 2017-09-18 VITALS — BP 157/68 | HR 89 | Resp 14 | Ht 67.0 in | Wt 126.0 lb

## 2017-09-18 DIAGNOSIS — Z992 Dependence on renal dialysis: Secondary | ICD-10-CM

## 2017-09-18 DIAGNOSIS — Z8673 Personal history of transient ischemic attack (TIA), and cerebral infarction without residual deficits: Secondary | ICD-10-CM | POA: Insufficient documentation

## 2017-09-18 DIAGNOSIS — R0989 Other specified symptoms and signs involving the circulatory and respiratory systems: Secondary | ICD-10-CM

## 2017-09-18 DIAGNOSIS — R269 Unspecified abnormalities of gait and mobility: Secondary | ICD-10-CM

## 2017-09-18 DIAGNOSIS — Z794 Long term (current) use of insulin: Secondary | ICD-10-CM | POA: Diagnosis not present

## 2017-09-18 DIAGNOSIS — I252 Old myocardial infarction: Secondary | ICD-10-CM | POA: Insufficient documentation

## 2017-09-18 DIAGNOSIS — Z9889 Other specified postprocedural states: Secondary | ICD-10-CM | POA: Insufficient documentation

## 2017-09-18 DIAGNOSIS — Z833 Family history of diabetes mellitus: Secondary | ICD-10-CM | POA: Insufficient documentation

## 2017-09-18 DIAGNOSIS — E1122 Type 2 diabetes mellitus with diabetic chronic kidney disease: Secondary | ICD-10-CM | POA: Insufficient documentation

## 2017-09-18 DIAGNOSIS — R6889 Other general symptoms and signs: Secondary | ICD-10-CM | POA: Diagnosis not present

## 2017-09-18 DIAGNOSIS — T8149XA Infection following a procedure, other surgical site, initial encounter: Secondary | ICD-10-CM

## 2017-09-18 DIAGNOSIS — Z89511 Acquired absence of right leg below knee: Secondary | ICD-10-CM

## 2017-09-18 DIAGNOSIS — G8929 Other chronic pain: Secondary | ICD-10-CM | POA: Diagnosis not present

## 2017-09-18 DIAGNOSIS — N186 End stage renal disease: Secondary | ICD-10-CM | POA: Diagnosis not present

## 2017-09-18 DIAGNOSIS — E1142 Type 2 diabetes mellitus with diabetic polyneuropathy: Secondary | ICD-10-CM

## 2017-09-18 DIAGNOSIS — Z8249 Family history of ischemic heart disease and other diseases of the circulatory system: Secondary | ICD-10-CM | POA: Diagnosis not present

## 2017-09-18 DIAGNOSIS — M25512 Pain in left shoulder: Secondary | ICD-10-CM

## 2017-09-18 DIAGNOSIS — T8131XA Disruption of external operation (surgical) wound, not elsewhere classified, initial encounter: Secondary | ICD-10-CM | POA: Diagnosis not present

## 2017-09-18 DIAGNOSIS — Z09 Encounter for follow-up examination after completed treatment for conditions other than malignant neoplasm: Secondary | ICD-10-CM | POA: Insufficient documentation

## 2017-09-18 NOTE — Progress Notes (Addendum)
Subjective:    Patient ID: Charles Vasquez, male    DOB: 05/30/1935, 82 y.o.   MRN: [id number]  HPI 82 year old right-handed male with history of hypertension, CAD, with end-stage renal disease, hemodialysis, as well as diabetes mellitus presents for hospital follow up after receiving CIR for right BKA.   DATE OF ADMISSION:  07/17/2017 DATE OF DISCHARGE:  07/31/2017  Presents with family and a friend, who supplement history. He was discharged to SNF. Poor historian. At discharge, he was instructed to follow up with Vascular, who is following for healing of wound. He is not sure if he saw Nephro. Pain is controlled, including the shoulder, only occurring occasionally. He states CBGs have been relatively controlled. BP elevated.  Sleep is inconsistent.  Denies falls.  Pain Inventory Average Pain 0 Pain Right Now 0 My pain is no pain  In the last 24 hours, has pain interfered with the following? General activity 0 Relation with others 0 Enjoyment of life 0 What TIME of day is your pain at its worst? no pain Sleep (in general) Fair  Pain is worse with: no pain Pain improves with: no pain Relief from Meds: no pain  Mobility ability to climb steps?  no do you drive?  no use a wheelchair needs help with transfers  Function not employed: date last employed . disabled: date disabled . I need assistance with the following:  dressing, bathing, toileting, meal prep, household duties and shopping  Neuro/Psych bowel control problems weakness trouble walking  Prior Studies hospital f/u  Physicians involved in your care hospital f/u   Family History  Problem Relation Age of Onset  . Diabetes Mother   . Hypertension Mother   . AAA (abdominal aortic aneurysm) Mother   . Diabetes Father   . Hypertension Father    Social History   Socioeconomic History  . Marital status: Widowed    Spouse name: Not on file  . Number of children: Not on file  . Years of education: Not on  file  . Highest education level: Not on file  Occupational History  . Not on file  Social Needs  . Financial resource strain: Not very hard  . Food insecurity:    Worry: Never true    Inability: Never true  . Transportation needs:    Medical: No    Non-medical: No  Tobacco Use  . Smoking status: Never Smoker  . Smokeless tobacco: Never Used  Substance and Sexual Activity  . Alcohol use: No  . Drug use: No  . Sexual activity: Yes  Lifestyle  . Physical activity:    Days per week: 2 days    Minutes per session: 20 min  . Stress: Only a little  Relationships  . Social connections:    Talks on phone: Once a week    Gets together: Once a week    Attends religious service: 1 to 4 times per year    Active member of club or organization: Yes    Attends meetings of clubs or organizations: 1 to 4 times per year    Relationship status: Widowed  Other Topics Concern  . Not on file  Social History Narrative  . Not on file   Past Surgical History:  Procedure Laterality Date  . ABDOMINAL AORTOGRAM W/LOWER EXTREMITY N/A 06/25/2017   Procedure: ABDOMINAL AORTOGRAM W/LOWER EXTREMITY;  Surgeon: Maeola Harman, MD;  Location: [facility] INVASIVE CV LAB;  Service: Cardiovascular;  Laterality: N/A;  . AMPUTATION Right 01/01/2017  Procedure: REVISION AMPUTATION RIGHT INDEX FINGER;  Surgeon: Dairl Ponder, MD;  Location: [facility] OR;  Service: Orthopedics;  Laterality: Right;  . AMPUTATION Right 07/15/2017   Procedure: AMPUTATION BELOW KNEE RIGHT;  Surgeon: [name], MD;  Location: [facility] OR;  Service: Vascular;  Laterality: Right;  . ARTERIOVENOUS GRAFT PLACEMENT    . ARTERIOVENOUS GRAFT PLACEMENT Left 10/22/2016   thigh  . AV FISTULA PLACEMENT  03/31/2012   Procedure: ARTERIOVENOUS (AV) FISTULA CREATION;  Surgeon: [name], MD;  Location: [facility] OR;  Service: Vascular;  Laterality: Right;  First stage Brachial vein transposition  . AV FISTULA PLACEMENT Right 08/25/2012   Procedure:  INSERTION OF ARTERIOVENOUS (AV) GORE-TEX GRAFT ARM;  Surgeon: [name], MD;  Location: [facility] OR;  Service: Vascular;  Laterality: Right;  . AV FISTULA PLACEMENT Left 07/23/2016   Procedure: INSERTION OF ARTERIOVENOUS (AV) GORE-TEX GRAFT LEFT UPPER ARM;  Surgeon: [name], MD;  Location: [facility] OR;  Service: Vascular;  Laterality: Left;  . AV FISTULA PLACEMENT Left 10/22/2016   Procedure: INSERTION OF 4-59mm x 45cm  ARTERIOVENOUS (AV) GORE-TEX GRAFT THIGH-LEFT;  Surgeon: [name], MD;  Location: [facility] OR;  Service: Vascular;  Laterality: Left;  . CATARACT EXTRACTION W/ INTRAOCULAR LENS  IMPLANT, BILATERAL    . COLONOSCOPY N/A 03/25/2017   Dr. Darrick Penna: examined portion of ileum normal. Redundant left colon, stricture at anus   . ESOPHAGOGASTRODUODENOSCOPY N/A 03/25/2017   Dr. Darrick Penna: widely patent Schatzki ring at GE junction s/p dilation, atrophic gastritis, single duodenal AVM, non-bleeding  . INSERTION OF DIALYSIS CATHETER Left 05/05/2012   Procedure: INSERTION OF DIALYSIS CATHETER;  Surgeon: Chuck Hint, MD;  Location: [facility] OR;  Service: Vascular;  Laterality: Left;  . INSERTION OF DIALYSIS CATHETER Right 10/01/2016   Procedure: INSERTION OF DIALYSIS CATHETER- RIGHT FEMORAL;  Surgeon: [name], MD;  Location: [facility] OR;  Service: Vascular;  Laterality: Right;  . IR FLUORO GUIDE CV LINE RIGHT  06/10/2016  . IR GENERIC HISTORICAL  06/07/2016   IR US GUIDE VASC ACCESS RIGHT 06/07/2016 Oley Balm, MD [facility]-INTER[facility]  . IR THROMBECTOMY AV FISTULA W/THROMBOLYSIS/PTA INC/SHUNT/IMG RIGHT Right 06/07/2016  . IR US GUIDE VASC ACCESS RIGHT  06/10/2016  . LIGATION ARTERIOVENOUS GORTEX GRAFT Left 08/04/2016   Procedure: LIGATION ARTERIOVENOUS GORTEX GRAFT;  Surgeon: Larina Earthly, MD;  Location: [facility] OR;  Service: Vascular;  Laterality: Left;  . LIGATION ARTERIOVENOUS GORTEX GRAFT Right 11/26/2016   Procedure: LIGATION OF RIGHT UPPER ARM ARTERIOVENOUS GORTEX GRAFT;  Surgeon: [name], MD;  Location: [facility]  OR;  Service: Vascular;  Laterality: Right;  . LIGATION OF ARTERIOVENOUS  FISTULA Right 08/25/2012   Procedure: LIGATION OF ARTERIOVENOUS  FISTULA;  Surgeon: [name], MD;  Location: [facility] OR;  Service: Vascular;  Laterality: Right;  Ligation of right brachial vein transposition  . PERIPHERAL VASCULAR ATHERECTOMY  06/25/2017   Procedure: PERIPHERAL VASCULAR ATHERECTOMY;  Surgeon: Maeola Harman, MD;  Location: [facility] INVASIVE CV LAB;  Service: Cardiovascular;;  Rt. PT  . REMOVAL OF A DIALYSIS CATHETER Right 05/05/2012   Procedure: REMOVAL OF A DIALYSIS CATHETER;  Surgeon: Chuck Hint, MD;  Location: [facility] OR;  Service: Vascular;  Laterality: Right;  . REMOVAL OF A DIALYSIS CATHETER Right 10/01/2016   Procedure: REMOVAL OF A DIALYSIS CATHETER-RIGHT UPPER CHEST;  Surgeon: [name], MD;  Location: [facility] OR;  Service: Vascular;  Laterality: Right;  . REMOVAL OF A DIALYSIS CATHETER Right 11/26/2016   Procedure: REMOVAL  OF RIGHT FEMORAL TUNNEL DIALYSIS CATHETER;  Surgeon: [name], MD;  Location: [facility] OR;  Service: Vascular;  Laterality: Right;  . SPHINCTEROTOMY N/A 05/15/2017   Procedure: POSSIBLE SPHINCTEROTOMY (BOTOX);  Surgeon: Romie Levee, MD;  Location: [facility];  Service: General;  Laterality: N/A;  . TOE AMPUTATION  02/2007   left foot first 3 toes  . UPPER EXTREMITY VENOGRAPHY Bilateral 07/18/2016   Procedure: Bilateral Upper Extremity Venography;  Surgeon: [name], MD;  Location: [facility] INVASIVE CV LAB;  Service: Cardiovascular;  Laterality: Bilateral;   Past Medical History:  Diagnosis Date  . Anal pain   . AVF (arteriovenous fistula) (HCC) 05-13-2017 per pt currently AVF access used is left thigh   hx multiple AVF surgery's (previously left upper arm, bilateral thigh) last surgery -- right upper arm creation 11-26-2016  . ESRD (end stage renal disease) on dialysis [facility])    [facility] in [place], [place] on MWF   . History of CVA (cerebrovascular  accident)    05-13-2017  per pt stated "thats what they told yrs ago"  per pt no residual  . Hyperlipidemia   . Hypertension   . Myocardial infarction (HCC)   . Stroke (HCC)   . Type 2 diabetes mellitus treated with insulin (HCC)    BP (!) 157/68   Pulse 89   Resp 14   Ht 5\' 7"  (1.702 m)   Wt 126 lb (57.2 kg)   SpO2 94%   BMI 19.73 kg/m   Opioid Risk Score:   Fall Risk Score:  `1  Depression screen PHQ 2/9  Depression screen PHQ 2/9 06/17/2017  Decreased Interest 0  Down, Depressed, Hopeless 0  PHQ - 2 Score 0    Review of Systems  Constitutional: Negative.   HENT: Negative.   Eyes: Negative.   Respiratory: Negative.   Cardiovascular: Positive for leg swelling.  Gastrointestinal: Positive for constipation and diarrhea.  Endocrine: Negative.   Musculoskeletal: Positive for arthralgias and gait problem.  Skin: Negative.   Allergic/Immunologic: Negative.   Neurological: Positive for weakness.  Hematological: Negative.   Psychiatric/Behavioral: Negative.   All other systems reviewed and are negative.      Objective:   Physical Exam Constitutional: No distress . Vital signs reviewed. HENT: Normocephalic.  Atraumatic. Eyes: EOMI. No discharge. Cardiovascular: RRR. No JVD. Respiratory: CTA Bilaterally.  Normal  effort. GI: BS +. Non-distended. MSK: Mild TTP in stump Right BKA edema and tenderness.  Neurology: Alert HOH RUE: amputation of 2nd digit at PIP LLE: amputation digits  Neurological: He is alert, poor historial Motor: B/l UE, LLE, Right hip flexion 4+-5/5 proximal to distal (stable) Skin: BKA with dehisced area with drainage and foul oder Left heal with ulcer    Assessment & Plan:  82 year old right-handed male with history of hypertension, CAD, with end-stage renal disease, hemodialysis, as well as diabetes mellitus presents for hospital follow up after receiving CIR for right BKA.   1. Decreased functional mobility secondary to right BKA  07/15/2017  Cont therapies  Cont follow up with Vascuar  Now with wound dehiscence - appropriate ace wrapping for distal stump.  Referral to Wound care with consideration of HBO to avoid proximal ambulation  Doxycycline 100 BID x 14 days  2. Pain Management:   Controlled at present  3. End-stage renal disease with hemodialysis.    Cont follow up with nephro  4.  Diabetes mellitus with peripheral neuropathy.    Cont meds  5.  Hypertension.  Labile at present per pt  Cont meds  Recs per Nephro  6. Gait abnormality  Cont therapies  Cont wheelchair for safety

## 2017-09-19 DIAGNOSIS — Z992 Dependence on renal dialysis: Secondary | ICD-10-CM | POA: Diagnosis not present

## 2017-09-19 DIAGNOSIS — N186 End stage renal disease: Secondary | ICD-10-CM | POA: Diagnosis not present

## 2017-09-22 ENCOUNTER — Telehealth (INDEPENDENT_AMBULATORY_CARE_PROVIDER_SITE_OTHER): Payer: Self-pay | Admitting: Orthopedic Surgery

## 2017-09-22 DIAGNOSIS — N186 End stage renal disease: Secondary | ICD-10-CM | POA: Diagnosis not present

## 2017-09-22 DIAGNOSIS — Z992 Dependence on renal dialysis: Secondary | ICD-10-CM | POA: Diagnosis not present

## 2017-09-22 NOTE — Telephone Encounter (Signed)
Returned call to [place] with [facility]  Left message to return call. [phone]

## 2017-09-23 ENCOUNTER — Ambulatory Visit (INDEPENDENT_AMBULATORY_CARE_PROVIDER_SITE_OTHER): Payer: Medicare Other | Admitting: Orthopaedic Surgery

## 2017-09-24 ENCOUNTER — Ambulatory Visit (INDEPENDENT_AMBULATORY_CARE_PROVIDER_SITE_OTHER): Payer: Medicare Other | Admitting: Family

## 2017-09-24 ENCOUNTER — Encounter (INDEPENDENT_AMBULATORY_CARE_PROVIDER_SITE_OTHER): Payer: Self-pay | Admitting: Family

## 2017-09-24 DIAGNOSIS — I70263 Atherosclerosis of native arteries of extremities with gangrene, bilateral legs: Secondary | ICD-10-CM

## 2017-09-24 DIAGNOSIS — M869 Osteomyelitis, unspecified: Secondary | ICD-10-CM

## 2017-09-24 DIAGNOSIS — Z89511 Acquired absence of right leg below knee: Secondary | ICD-10-CM

## 2017-09-24 DIAGNOSIS — S88111A Complete traumatic amputation at level between knee and ankle, right lower leg, initial encounter: Secondary | ICD-10-CM

## 2017-09-24 MED ORDER — DOXYCYCLINE HYCLATE 100 MG PO TABS: 100.0000 mg | ORAL_TABLET | Freq: Two times a day (BID) | ORAL | 0 refills | Status: DC

## 2017-09-24 MED ORDER — COLLAGENASE 250 UNIT/GM EX OINT: 1.0000 "application " | TOPICAL_OINTMENT | Freq: Every day | CUTANEOUS | 0 refills | Status: DC

## 2017-09-24 NOTE — Progress Notes (Signed)
Office Visit Note   Patient: Charles Vasquez           Date of Birth: [birth year]           MRN: [id number] Visit Date: 09/24/2017              Requested by: Toma Deiters, MD [address] [place], [place] [address] PCP: Toma Deiters, MD  Chief Complaint  Patient presents with  . Right Leg - Wound Check      HPI: Patient is a 82 year old gentleman skilled nursing resident. [facility], who has been seen by vascular vein surgery with multiple vascular interventions including a transtibial amputation on the right.  Patient is seen for gangrene of the left lower extremity ischemic ulceration to the right transtibial amputation as well as right index and long finger dry gangrene.  Assessment & Plan: Visit Diagnoses:  No diagnosis found.  Plan: Discussed with the patient and the family treatment options including amputations versus continued observation.  Will start patient on Trental 400 mg 3 times a day and continue with dressing changes to the right transtibial amputation and dry dressing changes to the left heel with a foam boots to unload pressure from the heel.  Discussed that if conservative treatment does not work patient would require an above-the-knee amputation on the right and amputation of the index and long finger of the right hand.  Patient also may require a left above-knee amputation as well.  Follow-Up Instructions: No follow-ups on file.   Ortho Exam  Patient is alert, oriented, no adenopathy, well-dressed, normal affect, normal respiratory effort. Examination patient is thin and cachectic with severe protein caloric malnutrition. Examination of the right hand he is status post partial amputation of the right index finger with dry gangrenous changes to the tip of the amputated digit, with exposed bone of middle phalanx.  Patient also has dry gangrenous changes to the right long finger. Both fingers with darkened discoloration. No drainage or erythema. Does have  swelling.  Examination of the right transtibial amputation patient has approximately 95% fibrinous exudate with ischemic changes.  There is no cellulitis no odor no exposed bone or tendon. This was debrided with 10 blade knife. No drainage. No surroiunding erythema. No odor no sign of infection.  .  Imaging: No results found. No images are attached to the encounter.  Labs: Lab Results  Component Value Date   HGBA1C 7.1 (H) 12/24/2016   HGBA1C 8.8 (H) 08/10/2016   HGBA1C 7.5 11/10/2008   ESRSEDRATE 110 (H) 07/12/2017   CRP 21.4 (H) 07/12/2017   REPTSTATUS 07/17/2017 FINAL 07/12/2017   GRAMSTAIN  08/04/2016    RARE WBC PRESENT, PREDOMINANTLY PMN FEW GRAM NEGATIVE RODS FEW GRAM NEGATIVE DIPLOCOCCI RARE GRAM POSITIVE COCCI IN CLUSTERS    CULT  07/12/2017    NO GROWTH 5 DAYS Performed at [facility], [address], [place], [place] [address]    [name] PROTEUS SPECIES 07/12/2017   LABORGA STREPTOCOCCUS PARASANGUINIS 07/12/2017     Lab Results  Component Value Date   ALBUMIN 1.9 (L) 07/31/2017   ALBUMIN 1.9 (L) 07/28/2017   ALBUMIN 2.0 (L) 07/25/2017    There is no height or weight on file to calculate BMI.  Orders:  No orders of the defined types were placed in this encounter.  Meds ordered this encounter  Medications  . doxycycline (VIBRA-TABS) 100 MG tablet    Sig: Take 1 tablet (100 mg total) by mouth 2 (two)  times daily.    Dispense:  60 tablet    Refill:  0  . collagenase (SANTYL) ointment    Sig: Apply 1 application topically daily.    Dispense:  15 g    Refill:  0     Procedures: No procedures performed  Clinical Data: No additional findings.  ROS:  All other systems negative, except as noted in the HPI. Review of Systems  Constitutional: Negative for chills and fever.  Skin: Positive for color change and wound.    Objective: Vital Signs: There were no vitals taken for this visit.  Specialty Comments:  No specialty comments  available.  PMFS History: Patient Active Problem List   Diagnosis Date Noted  . Severe protein-calorie malnutrition (HCC) 09/16/2017  . 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 Labile blood pressure   . Chronic left shoulder pain   . Bacteremia   . Labile blood glucose   . Hypertensive crisis   . Hypoglycemia   . Leukocytosis   . Acute blood loss anemia   . Anemia of chronic disease   . ESRD on dialysis (HCC)   . Type 2 diabetes mellitus with peripheral neuropathy (HCC)   . Benign essential HTN   . 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 Pressure injury of skin 07/13/2017  . Diabetic foot infection (HCC) 07/12/2017  . CAD (coronary artery disease) 07/12/2017  . Type II diabetes mellitus with renal manifestations (HCC) 07/12/2017  . Wound of right foot   . Loose stools 05/20/2017  . Abdominal pain   . Loss of weight 03/18/2017  . Abnormal CT scan, colon 03/18/2017  . Melena 03/18/2017  . Periumbilical abdominal pain 03/18/2017  . ESRD (end stage renal disease) on dialysis (HCC) 10/22/2016  . Venous hypertension status post AVG procedure 08/10/2016  . Anasarca 08/04/2016  . Facial swelling 08/04/2016  . Effusion, other site 08/04/2016  . 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 Acute respiratory failure (HCC) 08/04/2016  . Hyperkalemia 08/04/2016  . Aftercare following surgery of the circulatory system, NEC-Right  AVGG 09/25/2012  . Other complications due to renal dialysis device, implant, and graft 05/01/2012  . End stage renal disease (HCC) 03/17/2012  . FATIGUE, ACUTE 08/22/2008  . HEMATURIA UNSPECIFIED 07/11/2008  . Diabetes (HCC) 01/19/2008  . Essential hypertension 01/19/2008  . MYOCARDIAL INFARCTION, HX OF 01/19/2008  . GERD 01/19/2008  . OSTEOARTHRITIS 01/19/2008  . CEREBROVASCULAR ACCIDENT, HX OF 01/19/2008   Past Medical History:  Diagnosis Date  . Anal pain   . AVF  (arteriovenous fistula) (HCC) 05-13-2017 per pt currently AVF access used is left thigh   hx multiple AVF surgery's (previously left upper arm, bilateral thigh) last surgery -- right upper arm creation 11-26-2016  . ESRD (end stage renal disease) on dialysis [facility])    [facility] in [place], [place] on MWF   . History of CVA (cerebrovascular accident)    05-13-2017  per pt stated "thats what they told yrs ago"  per pt no residual  . Hyperlipidemia   . Hypertension   . Myocardial infarction (HCC)   . Stroke (HCC)   . Type 2 diabetes mellitus treated with insulin (HCC)     Family History  Problem Relation Age of Onset  . Diabetes Mother   . Hypertension Mother   . AAA (abdominal aortic aneurysm) Mother   . Diabetes Father   . Hypertension Father     Past Surgical  History:  Procedure Laterality Date  . ABDOMINAL AORTOGRAM W/LOWER EXTREMITY N/A 06/25/2017   Procedure: ABDOMINAL AORTOGRAM W/LOWER EXTREMITY;  Surgeon: Maeola Harman, MD;  Location: [facility] INVASIVE CV LAB;  Service: Cardiovascular;  Laterality: N/A;  . AMPUTATION Right 01/01/2017   Procedure: REVISION AMPUTATION RIGHT INDEX FINGER;  Surgeon: Dairl Ponder, MD;  Location: [facility] OR;  Service: Orthopedics;  Laterality: Right;  . AMPUTATION Right 07/15/2017   Procedure: AMPUTATION BELOW KNEE RIGHT;  Surgeon: [name], MD;  Location: [facility] OR;  Service: Vascular;  Laterality: Right;  . ARTERIOVENOUS GRAFT PLACEMENT    . ARTERIOVENOUS GRAFT PLACEMENT Left 10/22/2016   thigh  . AV FISTULA PLACEMENT  03/31/2012   Procedure: ARTERIOVENOUS (AV) FISTULA CREATION;  Surgeon: [name], MD;  Location: [facility] OR;  Service: Vascular;  Laterality: Right;  First stage Brachial vein transposition  . AV FISTULA PLACEMENT Right 08/25/2012   Procedure: INSERTION OF ARTERIOVENOUS (AV) GORE-TEX GRAFT ARM;  Surgeon: [name], MD;  Location: [facility] OR;  Service: Vascular;  Laterality: Right;  . AV FISTULA PLACEMENT Left 07/23/2016    Procedure: INSERTION OF ARTERIOVENOUS (AV) GORE-TEX GRAFT LEFT UPPER ARM;  Surgeon: [name], MD;  Location: [facility] OR;  Service: Vascular;  Laterality: Left;  . AV FISTULA PLACEMENT Left 10/22/2016   Procedure: INSERTION OF 4-10mm x 45cm  ARTERIOVENOUS (AV) GORE-TEX GRAFT THIGH-LEFT;  Surgeon: [name], MD;  Location: [facility] OR;  Service: Vascular;  Laterality: Left;  . CATARACT EXTRACTION W/ INTRAOCULAR LENS  IMPLANT, BILATERAL    . COLONOSCOPY N/A 03/25/2017   Dr. Darrick Penna: examined portion of ileum normal. Redundant left colon, stricture at anus   . ESOPHAGOGASTRODUODENOSCOPY N/A 03/25/2017   Dr. Darrick Penna: widely patent Schatzki ring at GE junction s/p dilation, atrophic gastritis, single duodenal AVM, non-bleeding  . INSERTION OF DIALYSIS CATHETER Left 05/05/2012   Procedure: INSERTION OF DIALYSIS CATHETER;  Surgeon: Chuck Hint, MD;  Location: [facility] OR;  Service: Vascular;  Laterality: Left;  . INSERTION OF DIALYSIS CATHETER Right 10/01/2016   Procedure: INSERTION OF DIALYSIS CATHETER- RIGHT FEMORAL;  Surgeon: [name], MD;  Location: [facility] OR;  Service: Vascular;  Laterality: Right;  . IR FLUORO GUIDE CV LINE RIGHT  06/10/2016  . IR GENERIC HISTORICAL  06/07/2016   IR US GUIDE VASC ACCESS RIGHT 06/07/2016 Oley Balm, MD [facility]-INTER[facility] RAD  . IR THROMBECTOMY AV FISTULA W/THROMBOLYSIS/PTA INC/SHUNT/IMG RIGHT Right 06/07/2016  . IR US GUIDE VASC ACCESS RIGHT  06/10/2016  . LIGATION ARTERIOVENOUS GORTEX GRAFT Left 08/04/2016   Procedure: LIGATION ARTERIOVENOUS GORTEX GRAFT;  Surgeon: Larina Earthly, MD;  Location: [facility] OR;  Service: Vascular;  Laterality: Left;  . LIGATION ARTERIOVENOUS GORTEX GRAFT Right 11/26/2016   Procedure: LIGATION OF RIGHT UPPER ARM ARTERIOVENOUS GORTEX GRAFT;  Surgeon: [name], MD;  Location: [facility] OR;  Service: Vascular;  Laterality: Right;  . LIGATION OF ARTERIOVENOUS  FISTULA Right 08/25/2012   Procedure: LIGATION OF ARTERIOVENOUS  FISTULA;  Surgeon: [name], MD;   Location: [facility] OR;  Service: Vascular;  Laterality: Right;  Ligation of right brachial vein transposition  . PERIPHERAL VASCULAR ATHERECTOMY  06/25/2017   Procedure: PERIPHERAL VASCULAR ATHERECTOMY;  Surgeon: Maeola Harman, MD;  Location: [facility] INVASIVE CV LAB;  Service: Cardiovascular;;  Rt. PT  . REMOVAL OF A DIALYSIS CATHETER Right 05/05/2012   Procedure: REMOVAL OF A DIALYSIS CATHETER;  Surgeon: Chuck Hint, MD;  Location: [facility] OR;  Service: Vascular;  Laterality: Right;  . REMOVAL  OF A DIALYSIS CATHETER Right 10/01/2016   Procedure: REMOVAL OF A DIALYSIS CATHETER-RIGHT UPPER CHEST;  Surgeon: [name], MD;  Location: [facility] OR;  Service: Vascular;  Laterality: Right;  . REMOVAL OF A DIALYSIS CATHETER Right 11/26/2016   Procedure: REMOVAL OF RIGHT FEMORAL TUNNEL DIALYSIS CATHETER;  Surgeon: [name], MD;  Location: [facility] OR;  Service: Vascular;  Laterality: Right;  . SPHINCTEROTOMY N/A 05/15/2017   Procedure: POSSIBLE SPHINCTEROTOMY (BOTOX);  Surgeon: Romie Levee, MD;  Location: [facility];  Service: General;  Laterality: N/A;  . TOE AMPUTATION  02/2007   left foot first 3 toes  . UPPER EXTREMITY VENOGRAPHY Bilateral 07/18/2016   Procedure: Bilateral Upper Extremity Venography;  Surgeon: [name], MD;  Location: [facility] INVASIVE CV LAB;  Service: Cardiovascular;  Laterality: Bilateral;   Social History   Occupational History  . Not on file  Tobacco Use  . Smoking status: Never Smoker  . Smokeless tobacco: Never Used  Substance and Sexual Activity  . Alcohol use: No  . Drug use: No  . Sexual activity: Yes

## 2017-09-24 NOTE — Addendum Note (Signed)
Addended by: Cherre Huger E on: 09/24/2017 04:31 PM   Modules accepted: Orders

## 2017-09-26 DIAGNOSIS — N186 End stage renal disease: Secondary | ICD-10-CM | POA: Diagnosis not present

## 2017-09-26 DIAGNOSIS — Z992 Dependence on renal dialysis: Secondary | ICD-10-CM | POA: Diagnosis not present

## 2017-09-29 ENCOUNTER — Ambulatory Visit (INDEPENDENT_AMBULATORY_CARE_PROVIDER_SITE_OTHER): Payer: Medicare Other | Admitting: Orthopedic Surgery

## 2017-09-29 DIAGNOSIS — Z992 Dependence on renal dialysis: Secondary | ICD-10-CM | POA: Diagnosis not present

## 2017-09-29 DIAGNOSIS — N186 End stage renal disease: Secondary | ICD-10-CM | POA: Diagnosis not present

## 2017-09-30 ENCOUNTER — Ambulatory Visit (INDEPENDENT_AMBULATORY_CARE_PROVIDER_SITE_OTHER): Payer: Medicare Other | Admitting: Orthopedic Surgery

## 2017-10-01 DIAGNOSIS — Z992 Dependence on renal dialysis: Secondary | ICD-10-CM | POA: Diagnosis not present

## 2017-10-01 DIAGNOSIS — N186 End stage renal disease: Secondary | ICD-10-CM | POA: Diagnosis not present

## 2017-10-02 DIAGNOSIS — M79641 Pain in right hand: Secondary | ICD-10-CM | POA: Diagnosis not present

## 2017-10-02 DIAGNOSIS — I96 Gangrene, not elsewhere classified: Secondary | ICD-10-CM | POA: Diagnosis not present

## 2017-10-03 DIAGNOSIS — Z992 Dependence on renal dialysis: Secondary | ICD-10-CM | POA: Diagnosis not present

## 2017-10-03 DIAGNOSIS — N186 End stage renal disease: Secondary | ICD-10-CM | POA: Diagnosis not present

## 2017-10-06 ENCOUNTER — Other Ambulatory Visit: Payer: Self-pay | Admitting: Orthopedic Surgery

## 2017-10-06 DIAGNOSIS — Z89511 Acquired absence of right leg below knee: Secondary | ICD-10-CM | POA: Diagnosis not present

## 2017-10-06 DIAGNOSIS — M6281 Muscle weakness (generalized): Secondary | ICD-10-CM | POA: Diagnosis not present

## 2017-10-06 DIAGNOSIS — R2689 Other abnormalities of gait and mobility: Secondary | ICD-10-CM | POA: Diagnosis not present

## 2017-10-06 DIAGNOSIS — N186 End stage renal disease: Secondary | ICD-10-CM | POA: Diagnosis not present

## 2017-10-06 DIAGNOSIS — Z992 Dependence on renal dialysis: Secondary | ICD-10-CM | POA: Diagnosis not present

## 2017-10-07 ENCOUNTER — Other Ambulatory Visit: Payer: Self-pay

## 2017-10-07 ENCOUNTER — Ambulatory Visit (INDEPENDENT_AMBULATORY_CARE_PROVIDER_SITE_OTHER): Payer: Medicare Other | Admitting: Orthopedic Surgery

## 2017-10-07 ENCOUNTER — Encounter (HOSPITAL_COMMUNITY): Payer: Self-pay | Admitting: *Deleted

## 2017-10-07 DIAGNOSIS — Z992 Dependence on renal dialysis: Secondary | ICD-10-CM | POA: Diagnosis not present

## 2017-10-07 DIAGNOSIS — N186 End stage renal disease: Secondary | ICD-10-CM | POA: Diagnosis not present

## 2017-10-07 DIAGNOSIS — M6281 Muscle weakness (generalized): Secondary | ICD-10-CM | POA: Diagnosis not present

## 2017-10-07 DIAGNOSIS — R2689 Other abnormalities of gait and mobility: Secondary | ICD-10-CM | POA: Diagnosis not present

## 2017-10-07 DIAGNOSIS — Z89511 Acquired absence of right leg below knee: Secondary | ICD-10-CM | POA: Diagnosis not present

## 2017-10-07 NOTE — Progress Notes (Signed)
I called and left a message on Alissa's voice mail, inquiring about patient's  Dialysis treatment, scheduled for tomorrow, day of surgery.

## 2017-10-07 NOTE — Progress Notes (Addendum)
I spoke to patient's nurse, Lamar Laundry, who reports that patient's CBG runs 60- over 200, was 69 this am.  Patient receives Lantus at hs on M- W - F.   Patient's Dialysis was moved to today since he is  having surgery in am..sdw  check CBG after awaking and every 2 hours until arrival  to the hospital. If CBG is less than 70 to take 4 Glucose Tablets or 1 tube of Glucose Gel or 1/2 cup of a clear juice. DO NOt feed patient Recheck CBG in 15 minutes then call pre- op desk at [phone] for further instructions

## 2017-10-07 NOTE — Pre-Procedure Instructions (Signed)
    AUDRY CULLERS  10/07/2017      Your procedure is scheduled on Wednesday, July 31.  Report to [facility] Admitting at 10:45 A.M.    Call this number if you have problems the morning of surgery: [phone]  This is the number for the Pre- Surgical Desk.  #####Please send the Medication Record with patient with the Medications that were Administered documented. #####    Remember:  Do not eat or drink after midnight.  Take these medicines the morning of surgery with A SIP OF WATER : Protonix Trental May have Tramadol if needed.  Check CBG after awaking and every 2 hours until arrival  to the hospital.   If CBG is less than 70 to take 4 Glucose Tablets or 1 tube of Glucose Gel or 1/2 cup of a clear juice. Recheck CBG in 15 minutes then call pre- op desk at [phone] for further instructions.  If CBG is greater than 220, give 1/2 dose of Sliding Scale insulin  Have patient shower with antibacterial soap, brush teeth and wear clean clothes.    Do not wear jewelry, make-up or nail polish.  Do not wear lotions, powders, or perfumes, or deodorant.  Do not shave 48 hours prior to surgery.  Men may shave face and neck.  Do not bring valuables to the hospital.  [facility] is not responsible for any belongings or valuables.  Contacts, dentures or bridgework may not be worn into surgery.  Leave your suitcase in the car.  After surgery it may be brought to your room.

## 2017-10-08 ENCOUNTER — Other Ambulatory Visit: Payer: Self-pay

## 2017-10-08 ENCOUNTER — Encounter (HOSPITAL_COMMUNITY)
Admission: RE | Disposition: A | Discharge status: Rehabilitation Facility | Payer: Self-pay | Source: Ambulatory Visit | Attending: Orthopedic Surgery

## 2017-10-08 ENCOUNTER — Ambulatory Visit (HOSPITAL_COMMUNITY)
Admission: RE | Admit: 2017-10-08 | Discharge: 2017-10-08 | Disposition: A | Discharge status: Rehabilitation Facility | Payer: Medicare Other | Source: Ambulatory Visit | Attending: Orthopedic Surgery | Admitting: Orthopedic Surgery

## 2017-10-08 ENCOUNTER — Ambulatory Visit (HOSPITAL_COMMUNITY): Payer: Medicare Other | Admitting: Certified Registered Nurse Anesthetist

## 2017-10-08 ENCOUNTER — Encounter (HOSPITAL_COMMUNITY): Payer: Self-pay | Admitting: Certified Registered Nurse Anesthetist

## 2017-10-08 DIAGNOSIS — I120 Hypertensive chronic kidney disease with stage 5 chronic kidney disease or end stage renal disease: Secondary | ICD-10-CM | POA: Diagnosis not present

## 2017-10-08 DIAGNOSIS — Z961 Presence of intraocular lens: Secondary | ICD-10-CM | POA: Diagnosis not present

## 2017-10-08 DIAGNOSIS — Z794 Long term (current) use of insulin: Secondary | ICD-10-CM | POA: Insufficient documentation

## 2017-10-08 DIAGNOSIS — Z79899 Other long term (current) drug therapy: Secondary | ICD-10-CM | POA: Insufficient documentation

## 2017-10-08 DIAGNOSIS — I252 Old myocardial infarction: Secondary | ICD-10-CM | POA: Diagnosis not present

## 2017-10-08 DIAGNOSIS — N186 End stage renal disease: Secondary | ICD-10-CM | POA: Insufficient documentation

## 2017-10-08 DIAGNOSIS — Z9842 Cataract extraction status, left eye: Secondary | ICD-10-CM | POA: Diagnosis not present

## 2017-10-08 DIAGNOSIS — Z9841 Cataract extraction status, right eye: Secondary | ICD-10-CM | POA: Insufficient documentation

## 2017-10-08 DIAGNOSIS — E1152 Type 2 diabetes mellitus with diabetic peripheral angiopathy with gangrene: Secondary | ICD-10-CM | POA: Insufficient documentation

## 2017-10-08 DIAGNOSIS — M1990 Unspecified osteoarthritis, unspecified site: Secondary | ICD-10-CM | POA: Insufficient documentation

## 2017-10-08 DIAGNOSIS — Z791 Long term (current) use of non-steroidal anti-inflammatories (NSAID): Secondary | ICD-10-CM | POA: Diagnosis not present

## 2017-10-08 DIAGNOSIS — I998 Other disorder of circulatory system: Secondary | ICD-10-CM | POA: Diagnosis not present

## 2017-10-08 DIAGNOSIS — M869 Osteomyelitis, unspecified: Secondary | ICD-10-CM | POA: Diagnosis not present

## 2017-10-08 DIAGNOSIS — Z8673 Personal history of transient ischemic attack (TIA), and cerebral infarction without residual deficits: Secondary | ICD-10-CM | POA: Insufficient documentation

## 2017-10-08 DIAGNOSIS — Z8249 Family history of ischemic heart disease and other diseases of the circulatory system: Secondary | ICD-10-CM | POA: Insufficient documentation

## 2017-10-08 DIAGNOSIS — M6281 Muscle weakness (generalized): Secondary | ICD-10-CM | POA: Diagnosis not present

## 2017-10-08 DIAGNOSIS — D631 Anemia in chronic kidney disease: Secondary | ICD-10-CM | POA: Insufficient documentation

## 2017-10-08 DIAGNOSIS — R2689 Other abnormalities of gait and mobility: Secondary | ICD-10-CM | POA: Diagnosis not present

## 2017-10-08 DIAGNOSIS — I96 Gangrene, not elsewhere classified: Secondary | ICD-10-CM | POA: Insufficient documentation

## 2017-10-08 DIAGNOSIS — Z89511 Acquired absence of right leg below knee: Secondary | ICD-10-CM | POA: Diagnosis not present

## 2017-10-08 DIAGNOSIS — Z89422 Acquired absence of other left toe(s): Secondary | ICD-10-CM | POA: Diagnosis not present

## 2017-10-08 DIAGNOSIS — K219 Gastro-esophageal reflux disease without esophagitis: Secondary | ICD-10-CM | POA: Insufficient documentation

## 2017-10-08 DIAGNOSIS — Z888 Allergy status to other drugs, medicaments and biological substances status: Secondary | ICD-10-CM | POA: Insufficient documentation

## 2017-10-08 DIAGNOSIS — G709 Myoneural disorder, unspecified: Secondary | ICD-10-CM | POA: Diagnosis not present

## 2017-10-08 DIAGNOSIS — Z992 Dependence on renal dialysis: Secondary | ICD-10-CM | POA: Diagnosis not present

## 2017-10-08 DIAGNOSIS — E1122 Type 2 diabetes mellitus with diabetic chronic kidney disease: Secondary | ICD-10-CM | POA: Insufficient documentation

## 2017-10-08 HISTORY — PX: AMPUTATION: SHX166

## 2017-10-08 LAB — HEMOGLOBIN A1C
Hgb A1c MFr Bld: 6.6 % — ABNORMAL HIGH (ref 4.8–5.6)
MEAN PLASMA GLUCOSE: 142.72 mg/dL

## 2017-10-08 LAB — POCT I-STAT 4, (NA,K, GLUC, HGB,HCT)
Glucose, Bld: 150 mg/dL — ABNORMAL HIGH (ref 70–99)
HCT: 34 % — ABNORMAL LOW (ref 39.0–52.0)
Hemoglobin: 11.6 g/dL — ABNORMAL LOW (ref 13.0–17.0)
Potassium: 3.6 mmol/L (ref 3.5–5.1)
Sodium: 136 mmol/L (ref 135–145)

## 2017-10-08 LAB — GLUCOSE, CAPILLARY: Glucose-Capillary: 130 mg/dL — ABNORMAL HIGH (ref 70–99)

## 2017-10-08 SURGERY — AMPUTATION DIGIT
Anesthesia: General | Site: Finger | Laterality: Right

## 2017-10-08 MED ORDER — ALBUMIN HUMAN 5 % IV SOLN
INTRAVENOUS | Status: DC | PRN
  Administered 2017-10-08: 13:00:00 via INTRAVENOUS

## 2017-10-08 MED ORDER — ONDANSETRON HCL 4 MG/2ML IJ SOLN
INTRAMUSCULAR | Status: AC
  Filled 2017-10-08: qty 2

## 2017-10-08 MED ORDER — PROPOFOL 10 MG/ML IV BOLUS
INTRAVENOUS | Status: AC
  Filled 2017-10-08: qty 20

## 2017-10-08 MED ORDER — OXYCODONE HCL 5 MG/5ML PO SOLN: 5.0000 mg | Freq: Once | ORAL | Status: DC | PRN

## 2017-10-08 MED ORDER — BUPIVACAINE HCL (PF) 0.25 % IJ SOLN
INTRAMUSCULAR | Status: DC | PRN
  Administered 2017-10-08: 10 mL

## 2017-10-08 MED ORDER — LIDOCAINE 2% (20 MG/ML) 5 ML SYRINGE
INTRAMUSCULAR | Status: AC
  Filled 2017-10-08: qty 5

## 2017-10-08 MED ORDER — OXYCODONE HCL 5 MG PO TABS: 5.0000 mg | ORAL_TABLET | Freq: Once | ORAL | Status: DC | PRN

## 2017-10-08 MED ORDER — CEFAZOLIN SODIUM-DEXTROSE 2-4 GM/100ML-% IV SOLN
2.0000 g | INTRAVENOUS | Status: AC
  Administered 2017-10-08: 2 g via INTRAVENOUS
  Filled 2017-10-08: qty 100

## 2017-10-08 MED ORDER — LIDOCAINE 2% (20 MG/ML) 5 ML SYRINGE
INTRAMUSCULAR | Status: DC | PRN
  Administered 2017-10-08: 50 mg via INTRAVENOUS

## 2017-10-08 MED ORDER — FENTANYL CITRATE (PF) 100 MCG/2ML IJ SOLN: 25.0000 ug | INTRAMUSCULAR | Status: DC | PRN

## 2017-10-08 MED ORDER — SODIUM CHLORIDE 0.9 % IV SOLN
INTRAVENOUS | Status: DC
  Administered 2017-10-08: 12:00:00 via INTRAVENOUS

## 2017-10-08 MED ORDER — CHLORHEXIDINE GLUCONATE 4 % EX LIQD: 60.0000 mL | Freq: Once | CUTANEOUS | Status: DC

## 2017-10-08 MED ORDER — ONDANSETRON HCL 4 MG/2ML IJ SOLN
INTRAMUSCULAR | Status: DC | PRN
  Administered 2017-10-08: 4 mg via INTRAVENOUS

## 2017-10-08 MED ORDER — FENTANYL CITRATE (PF) 100 MCG/2ML IJ SOLN
INTRAMUSCULAR | Status: DC | PRN
  Administered 2017-10-08 (×5): 25 ug via INTRAVENOUS

## 2017-10-08 MED ORDER — DEXAMETHASONE SODIUM PHOSPHATE 10 MG/ML IJ SOLN
INTRAMUSCULAR | Status: DC | PRN
  Administered 2017-10-08: 5 mg via INTRAVENOUS

## 2017-10-08 MED ORDER — PROPOFOL 10 MG/ML IV BOLUS
INTRAVENOUS | Status: DC | PRN
  Administered 2017-10-08: 70 mg via INTRAVENOUS
  Administered 2017-10-08: 20 mg via INTRAVENOUS

## 2017-10-08 MED ORDER — FENTANYL CITRATE (PF) 250 MCG/5ML IJ SOLN
INTRAMUSCULAR | Status: AC
  Filled 2017-10-08: qty 5

## 2017-10-08 MED ORDER — BUPIVACAINE HCL (PF) 0.25 % IJ SOLN
INTRAMUSCULAR | Status: AC
  Filled 2017-10-08: qty 30

## 2017-10-08 MED ORDER — PHENYLEPHRINE 40 MCG/ML (10ML) SYRINGE FOR IV PUSH (FOR BLOOD PRESSURE SUPPORT)
PREFILLED_SYRINGE | INTRAVENOUS | Status: DC | PRN
  Administered 2017-10-08 (×2): 80 ug via INTRAVENOUS

## 2017-10-08 MED ORDER — DEXAMETHASONE SODIUM PHOSPHATE 10 MG/ML IJ SOLN
INTRAMUSCULAR | Status: AC
  Filled 2017-10-08: qty 1

## 2017-10-08 SURGICAL SUPPLY — 42 items
BANDAGE ACE 3X5.8 VEL STRL LF (GAUZE/BANDAGES/DRESSINGS) IMPLANT
BNDG CMPR 9X4 STRL LF SNTH (GAUZE/BANDAGES/DRESSINGS) ×1
BNDG COHESIVE 1X5 TAN STRL LF (GAUZE/BANDAGES/DRESSINGS) ×6 IMPLANT
BNDG CONFORM 2 STRL LF (GAUZE/BANDAGES/DRESSINGS) IMPLANT
BNDG ESMARK 4X9 LF (GAUZE/BANDAGES/DRESSINGS) ×3 IMPLANT
BNDG GAUZE ELAST 4 BULKY (GAUZE/BANDAGES/DRESSINGS) IMPLANT
CLOSURE WOUND 1/2 X4 (GAUZE/BANDAGES/DRESSINGS)
CORDS BIPOLAR (ELECTRODE) ×3 IMPLANT
COVER SURGICAL LIGHT HANDLE (MISCELLANEOUS) ×3 IMPLANT
CUFF TOURNIQUET SINGLE 18IN (TOURNIQUET CUFF) ×3 IMPLANT
CUFF TOURNIQUET SINGLE 24IN (TOURNIQUET CUFF) IMPLANT
DRAPE OEC MINIVIEW 54X84 (DRAPES) IMPLANT
DRAPE SURG 17X23 STRL (DRAPES) ×3 IMPLANT
DURAPREP 26ML APPLICATOR (WOUND CARE) ×3 IMPLANT
GAUZE SPONGE 2X2 8PLY STRL LF (GAUZE/BANDAGES/DRESSINGS) IMPLANT
GAUZE SPONGE 4X4 12PLY STRL (GAUZE/BANDAGES/DRESSINGS) ×3 IMPLANT
GAUZE XEROFORM 1X8 LF (GAUZE/BANDAGES/DRESSINGS) ×3 IMPLANT
GLOVE SURG SYN 8.0 (GLOVE) ×3 IMPLANT
GOWN STRL REUS W/ TWL LRG LVL3 (GOWN DISPOSABLE) ×1 IMPLANT
GOWN STRL REUS W/ TWL XL LVL3 (GOWN DISPOSABLE) ×1 IMPLANT
GOWN STRL REUS W/TWL LRG LVL3 (GOWN DISPOSABLE) ×3
GOWN STRL REUS W/TWL XL LVL3 (GOWN DISPOSABLE) ×3
KIT BASIN OR (CUSTOM PROCEDURE TRAY) ×3 IMPLANT
KIT TURNOVER KIT B (KITS) ×3 IMPLANT
MANIFOLD NEPTUNE II (INSTRUMENTS) ×3 IMPLANT
NEEDLE HYPO 25GX1X1/2 BEV (NEEDLE) IMPLANT
NS IRRIG 1000ML POUR BTL (IV SOLUTION) ×3 IMPLANT
PACK ORTHO EXTREMITY (CUSTOM PROCEDURE TRAY) ×3 IMPLANT
PAD ARMBOARD 7.5X6 YLW CONV (MISCELLANEOUS) ×6 IMPLANT
PAD CAST 3X4 CTTN HI CHSV (CAST SUPPLIES) IMPLANT
PADDING CAST COTTON 3X4 STRL (CAST SUPPLIES)
SPECIMEN JAR SMALL (MISCELLANEOUS) ×3 IMPLANT
SPONGE GAUZE 2X2 STER 10/PKG (GAUZE/BANDAGES/DRESSINGS)
STRIP CLOSURE SKIN 1/2X4 (GAUZE/BANDAGES/DRESSINGS) IMPLANT
SUCTION FRAZIER HANDLE 10FR (MISCELLANEOUS)
SUCTION TUBE FRAZIER 10FR DISP (MISCELLANEOUS) IMPLANT
TOWEL OR 17X24 6PK STRL BLUE (TOWEL DISPOSABLE) ×3 IMPLANT
TOWEL OR 17X26 10 PK STRL BLUE (TOWEL DISPOSABLE) ×3 IMPLANT
TUBE CONNECTING 12'X1/4 (SUCTIONS)
TUBE CONNECTING 12X1/4 (SUCTIONS) IMPLANT
UNDERPAD 30X30 (UNDERPADS AND DIAPERS) ×3 IMPLANT
WATER STERILE IRR 1000ML POUR (IV SOLUTION) ×3 IMPLANT

## 2017-10-08 NOTE — Anesthesia Procedure Notes (Signed)
Procedure Name: LMA Insertion Date/Time: 10/08/2017 1:26 PM Performed by: Pearson Grippe, CRNA Pre-anesthesia Checklist: Patient identified, Emergency Drugs available, Suction available and Patient being monitored Patient Re-evaluated:Patient Re-evaluated prior to induction Oxygen Delivery Method: Circle system utilized Preoxygenation: Pre-oxygenation with 100% oxygen Induction Type: IV induction Ventilation: Mask ventilation without difficulty LMA: LMA inserted LMA Size: 4.0 Number of attempts: 1 Airway Equipment and Method: Bite block Placement Confirmation: positive ETCO2 Tube secured with: Tape Dental Injury: Teeth and Oropharynx as per pre-operative assessment

## 2017-10-08 NOTE — Anesthesia Preprocedure Evaluation (Signed)
Anesthesia Evaluation  Patient identified by MRN, date of birth, ID band Patient awake    Reviewed: Allergy & Precautions, NPO status , Patient's Chart, lab work & pertinent test results  History of Anesthesia Complications Negative for: history of anesthetic complications  Airway Mallampati: I  TM Distance: >3 FB Neck ROM: Full    Dental  (+) Edentulous Upper, Edentulous Lower, Dental Advisory Given   Pulmonary    breath sounds clear to auscultation       Cardiovascular hypertension, Pt. on medications + CAD, + Past MI and + Peripheral Vascular Disease  Normal cardiovascular exam Rhythm:Regular Rate:Normal     Neuro/Psych  Neuromuscular disease CVA    GI/Hepatic GERD  Medicated and Controlled,  Endo/Other  diabetes, Type 2, Insulin Dependent  Renal/GU ESRF and DialysisRenal diseaseLast HD 5/6     Musculoskeletal  (+) Arthritis ,   Abdominal   Peds  Hematology  (+) anemia ,   Anesthesia Other Findings   Reproductive/Obstetrics                             Anesthesia Physical Anesthesia Plan  ASA: III  Anesthesia Plan: General   Post-op Pain Management:    Induction: Intravenous  PONV Risk Score and Plan: 2 and Ondansetron and Treatment may vary due to age or medical condition  Airway Management Planned: LMA  Additional Equipment: None  Intra-op Plan:   Post-operative Plan: Extubation in OR  Informed Consent: I have reviewed the patients History and Physical, chart, labs and discussed the procedure including the risks, benefits and alternatives for the proposed anesthesia with the patient or authorized representative who has indicated his/her understanding and acceptance.   Dental advisory given  Plan Discussed with: CRNA and Surgeon  Anesthesia Plan Comments:         Anesthesia Quick Evaluation

## 2017-10-08 NOTE — Op Note (Signed)
[name], [name] MEDICAL RECORD [id number] ACCOUNT [id number] DATE OF BIRTH:[birth year] FACILITY: [facility] LOCATION: [facility]-PERI[facility] PHYSICIAN:Santresa Levett A. Mina Marble, MD  OPERATIVE REPORT  DATE OF PROCEDURE:  10/08/2017  PREOPERATIVE DIAGNOSIS:  Chronic ischemia, right index and right long fingers.  POSTOPERATIVE DIAGNOSIS:  Chronic ischemia, right index and right long fingers.  PROCEDURE PERFORMED:  Revision amputation, right index and right lung.  SURGEON:  Dairl Ponder, MD  ASSISTANT:  None.  ANESTHESIA:  General.  COMPLICATIONS:  No complications.  DRAINS:  No drains.  DESCRIPTION OF PROCEDURE:  The patient was taken to the operating suite after the induction of adequate general anesthetic, where the right upper extremity was prepped and draped in the usual sterile fashion.  An Esmarch was used to exsanguinate the limb  and the tourniquet was inflated to 250 mmHg.  At this point in time, the right index finger and right long finger were approached surgically using fishmouth incisions.  The right index finger had a dorsal and volar flaps raised.  We identified the  neurovascular structures and performed bilateral neurectomies of the digital nerves of the index finger.  We dissected back to the level of the proximal interphalangeal joint, which was disarticulated.  We then advanced the volar flap over the top to  perform primary closure.  This was done after thorough irrigation.  The same procedure was performed for the long finger at the level of the middle phalanx after the distal interphalangeal joint disarticulation.  Bilateral neurectomies were performed and  direct closure was performed with advancement volar flap.  Both fingers were then dressed with Xeroform, 4 x 4s, compression wraps.    The patient tolerated these procedures well and went to recovery room in stable fashion.  AN/NUANCE  D:10/08/2017 T:10/08/2017 JOB:[id number]

## 2017-10-08 NOTE — Transfer of Care (Signed)
Immediate Anesthesia Transfer of Care Note  Patient: Charles Vasquez  Procedure(s) Performed: RIGHT INDEX, RIGHT LONG REVISION AMPUTATIONS (Right Finger)  Patient Location: PACU  Anesthesia Type:General  Level of Consciousness: drowsy and patient cooperative  Airway & Oxygen Therapy: Patient Spontanous Breathing and Patient connected to face mask oxygen  Post-op Assessment: Report given to RN and Post -op Vital signs reviewed and stable  Post vital signs: Reviewed and stable  Last Vitals:  Vitals Value Taken Time  BP 138/63 10/08/2017  2:35 PM  Temp    Pulse 66 10/08/2017  2:36 PM  Resp 22 10/08/2017  2:36 PM  SpO2 100 % 10/08/2017  2:36 PM  Vitals shown include unvalidated device data.  Last Pain:  Vitals:   10/08/17 1219  TempSrc:   PainSc: 5          Complications: No apparent anesthesia complications

## 2017-10-08 NOTE — H&P (Signed)
Charles Vasquez is an 82 y.o. male.   Chief Complaint: Right index and right long finger pain and swelling HPI: Patient's a very pleasant 82 year old male with end-stage renal disease on dialysis Mondays, Wednesdays, and Fridays with chronic digital ischemia involving right index right long fingers.  Past Medical History:  Diagnosis Date  . Anal pain   . AVF (arteriovenous fistula) (HCC) 05-13-2017 per pt currently AVF access used is left thigh   hx multiple AVF surgery's (previously left upper arm, bilateral thigh) last surgery -- right upper arm creation 11-26-2016  . ESRD (end stage renal disease) on dialysis [facility])    [facility] in [place], [place] on MWF   . History of CVA (cerebrovascular accident)    05-13-2017  per pt stated "thats what they told yrs ago"  per pt no residual  . Hyperlipidemia   . Hypertension   . Myocardial infarction (HCC)   . Stroke (HCC)   . Type 2 diabetes mellitus treated with insulin (HCC)    Type II    Past Surgical History:  Procedure Laterality Date  . ABDOMINAL AORTOGRAM W/LOWER EXTREMITY N/A 06/25/2017   Procedure: ABDOMINAL AORTOGRAM W/LOWER EXTREMITY;  Surgeon: Maeola Harman, MD;  Location: [facility] INVASIVE CV LAB;  Service: Cardiovascular;  Laterality: N/A;  . AMPUTATION Right 01/01/2017   Procedure: REVISION AMPUTATION RIGHT INDEX FINGER;  Surgeon: Dairl Ponder, MD;  [facility] OR;  Service: Orthopedics;  Laterality: Right;  . AMPUTATION Right 07/15/2017   Procedure: AMPUTATION BELOW KNEE RIGHT;  Surgeon: [name], MD;  Location: [facility] OR;  Service: Vascular;  Laterality: Right;  . ARTERIOVENOUS GRAFT PLACEMENT    . ARTERIOVENOUS GRAFT PLACEMENT Left 10/22/2016   thigh  . AV FISTULA PLACEMENT  03/31/2012   Procedure: ARTERIOVENOUS (AV) FISTULA CREATION;  Surgeon: [name], MD;  Location: [facility] OR;  Service: Vascular;  Laterality: Right;  First stage Brachial vein transposition  . AV FISTULA PLACEMENT Right 08/25/2012   Procedure: INSERTION OF ARTERIOVENOUS (AV) GORE-TEX GRAFT ARM;  Surgeon: [name], MD;  [facility] OR;  Service: Vascular;  Laterality: Right;  . AV FISTULA PLACEMENT Left 07/23/2016   Procedure: INSERTION OF ARTERIOVENOUS (AV) GORE-TEX GRAFT LEFT UPPER ARM;  Surgeon: [name], MD;  Location: [facility] OR;  Service: Vascular;  Laterality: Left;  . AV FISTULA PLACEMENT Left 10/22/2016   Procedure: INSERTION OF 4-69mm x 45cm  ARTERIOVENOUS (AV) GORE-TEX GRAFT THIGH-LEFT;  Surgeon: [name], MD;  Location: [facility] OR;  Service: Vascular;  Laterality: Left;  . CATARACT EXTRACTION W/ INTRAOCULAR LENS  IMPLANT, BILATERAL    . COLONOSCOPY N/A 03/25/2017   Dr. Darrick Penna: examined portion of ileum normal. Redundant left colon, stricture at anus   . ESOPHAGOGASTRODUODENOSCOPY N/A 03/25/2017   Dr. Darrick Penna: widely patent Schatzki ring at GE junction s/p dilation, atrophic gastritis, single duodenal AVM, non-bleeding  . INSERTION OF DIALYSIS CATHETER Left 05/05/2012   Procedure: INSERTION OF DIALYSIS CATHETER;  Surgeon: Chuck Hint, MD;  Location: [facility] OR;  Service: Vascular;  Laterality: Left;  . INSERTION OF DIALYSIS CATHETER Right 10/01/2016   Procedure: INSERTION OF DIALYSIS CATHETER- RIGHT FEMORAL;  Surgeon: [name], MD;  Location: [facility] OR;  Service: Vascular;  Laterality: Right;  . IR FLUORO GUIDE CV LINE RIGHT  06/10/2016  . IR GENERIC HISTORICAL  06/07/2016   IR US GUIDE VASC ACCESS RIGHT 06/07/2016 Oley Balm, MD [facility]-INTERV RAD  . IR THROMBECTOMY AV FISTULA W/THROMBOLYSIS/PTA INC/SHUNT/IMG RIGHT Right 06/07/2016  .  IR US GUIDE VASC ACCESS RIGHT  06/10/2016  . LIGATION ARTERIOVENOUS GORTEX GRAFT Left 08/04/2016   Procedure: LIGATION ARTERIOVENOUS GORTEX GRAFT;  Surgeon: Larina Earthly, MD;  Location: [facility] OR;  Service: Vascular;  Laterality: Left;  . LIGATION ARTERIOVENOUS GORTEX GRAFT Right 11/26/2016   Procedure: LIGATION OF RIGHT UPPER ARM ARTERIOVENOUS GORTEX GRAFT;  Surgeon: [name], MD;   Location: [facility] OR;  Service: Vascular;  Laterality: Right;  . LIGATION OF ARTERIOVENOUS  FISTULA Right 08/25/2012   Procedure: LIGATION OF ARTERIOVENOUS  FISTULA;  Surgeon: [name], MD;  Location: [facility] OR;  Service: Vascular;  Laterality: Right;  Ligation of right brachial vein transposition  . PERIPHERAL VASCULAR ATHERECTOMY  06/25/2017   Procedure: PERIPHERAL VASCULAR ATHERECTOMY;  Surgeon: Maeola Harman, MD;  Location: [facility] INVASIVE CV LAB;  Service: Cardiovascular;;  Rt. PT  . REMOVAL OF A DIALYSIS CATHETER Right 05/05/2012   Procedure: REMOVAL OF A DIALYSIS CATHETER;  Surgeon: Chuck Hint, MD;  Location: [facility] OR;  Service: Vascular;  Laterality: Right;  . REMOVAL OF A DIALYSIS CATHETER Right 10/01/2016   Procedure: REMOVAL OF A DIALYSIS CATHETER-RIGHT UPPER CHEST;  Surgeon: [name], MD;  Location: [facility] OR;  Service: Vascular;  Laterality: Right;  . REMOVAL OF A DIALYSIS CATHETER Right 11/26/2016   Procedure: REMOVAL OF RIGHT FEMORAL TUNNEL DIALYSIS CATHETER;  Surgeon: [name], MD;  Location: [facility] OR;  Service: Vascular;  Laterality: Right;  . SPHINCTEROTOMY N/A 05/15/2017   Procedure: POSSIBLE SPHINCTEROTOMY (BOTOX);  Surgeon: Romie Levee, MD;  Location: [facility];  Service: General;  Laterality: N/A;  . TOE AMPUTATION  02/2007   left foot first 3 toes  . UPPER EXTREMITY VENOGRAPHY Bilateral 07/18/2016   Procedure: Bilateral Upper Extremity Venography;  Surgeon: [name], MD;  Location: [facility] INVASIVE CV LAB;  Service: Cardiovascular;  Laterality: Bilateral;    Family History  Problem Relation Age of Onset  . Diabetes Mother   . Hypertension Mother   . AAA (abdominal aortic aneurysm) Mother   . Diabetes Father   . Hypertension Father    Social History:  reports that he has never smoked. He has never used smokeless tobacco. He reports that he does not drink alcohol or use drugs.  Allergies:  Allergies  Allergen Reactions  . Lisinopril Other  (See Comments)    HYPERKALEMIA RENAL DYSFUNCTION PROGRESSION    Medications Prior to Admission  Medication Sig Dispense Refill  . acetaminophen (TYLENOL) 650 MG CR tablet Take 650 mg by mouth every 8 (eight) hours as needed for pain.    . collagenase (SANTYL) ointment Apply 1 application topically daily. 15 g 0  . diclofenac sodium (VOLTAREN) 1 % GEL Apply 2 g topically 4 (four) times daily.    Marland Kitchen dicyclomine (BENTYL) 10 MG capsule 1 PO 30 MINUTES PRIOR TO BREAKFAST AND LUNCH (Patient taking differently: Take 10 mg by mouth 2 (two) times daily. ) 62 capsule 11  . doxycycline (VIBRA-TABS) 100 MG tablet Take 1 tablet (100 mg total) by mouth 2 (two) times daily. 60 tablet 0  . insulin glargine (LANTUS) 100 UNIT/ML injection Inject 0.08 mLs (8 Units total) into the skin every Monday, Wednesday, and Friday at 8 PM. (Patient taking differently: Inject 8 Units into the skin every Monday, Wednesday, and Friday at 8 PM. ) 10 mL 11  . insulin lispro (HUMALOG) 100 UNIT/ML injection Inject 0-8 Units into the skin 3 (three) times daily before meals. Blood sugar of 70-100=0  units 101-250=1 unit 151-200=2 units 201-250=4 units  251-300=6 units 301-350= 8 units Greater than 350= 10 units **Call MD if glucose greater than 450**    . iron polysaccharides (NIFEREX) 150 MG capsule Take 1 capsule (150 mg total) by mouth 2 (two) times daily.    Marland Kitchen lidocaine (LIDODERM) 5 % Place 1 patch onto the skin daily. Remove & Discard patch within 12 hours or as directed by MD 3 patch 0  . loperamide (IMODIUM) 2 MG capsule Take 2 mg by mouth every 6 (six) hours as needed for diarrhea or loose stools.    Marland Kitchen losartan (COZAAR) 50 MG tablet Take 50 mg by mouth every evening.     . multivitamin (RENA-VIT) TABS tablet Take 1 tablet by mouth daily.  0  . pantoprazole (PROTONIX) 40 MG tablet Take 1 tablet (40 mg total) by mouth 2 (two) times daily with a meal.    . pentoxifylline (TRENTAL) 400 MG CR tablet Take 1 tablet (400 mg  total) by mouth 3 (three) times daily with meals. 90 tablet 3  . sevelamer carbonate (RENVELA) 800 MG tablet Take 2 tablets (1,600 mg total) by mouth 3 (three) times daily with meals.    . Darbepoetin Alfa (ARANESP) 200 MCG/0.4ML SOSY injection Inject 0.4 mLs (200 mcg total) into the vein every Monday with hemodialysis. 1.68 mL   . doxercalciferol (HECTOROL) 4 MCG/2ML injection Inject 1 mL (2 mcg total) into the vein every Monday, Wednesday, and Friday with hemodialysis. 2 mL   . traMADol (ULTRAM) 50 MG tablet Take 1 tablet (50 mg total) by mouth every 6 (six) hours as needed. 6 tablet 0    Results for orders placed or performed during the hospital encounter of 10/08/17 (from the past 48 hour(s))  I-STAT 4, (NA,K, GLUC, HGB,HCT)     Status: Abnormal   Collection Time: 10/08/17 11:58 AM  Result Value Ref Range   Sodium 136 135 - 145 mmol/L   Potassium 3.6 3.5 - 5.1 mmol/L   Glucose, Bld 150 (H) 70 - 99 mg/dL   HCT 81.1 (L) 91.4 - 78.2 %   Hemoglobin 11.6 (L) 13.0 - 17.0 g/dL  Hemoglobin N5A     Status: Abnormal   Collection Time: 10/08/17 12:18 PM  Result Value Ref Range   Hgb A1c MFr Bld 6.6 (H) 4.8 - 5.6 %    Comment: (NOTE) Pre diabetes:          5.7%-6.4% Diabetes:              >6.4% Glycemic control for   <7.0% adults with diabetes    Mean Plasma Glucose 142.72 mg/dL    Comment: Performed at [facility], [address], [place], [place] [address]   No results found.  Review of Systems  All other systems reviewed and are negative.   Blood pressure (!) 166/68, pulse 72, temperature 97.7 F (36.5 C), temperature source Oral, resp. rate 18, height 5\' 7"  (1.702 m), weight 57.2 kg (126 lb), SpO2 99 %. Physical Exam  Constitutional: He is oriented to person, place, and time. He appears well-developed and well-nourished.  HENT:  Head: Normocephalic and atraumatic.  Neck: Normal range of motion.  Cardiovascular: Normal rate.  Respiratory: Effort normal.  Musculoskeletal:        Right hand: He exhibits tenderness and swelling.  Right index and right long finger chronic digital ischemia with dry gangrene  Neurological: He is alert and oriented to person, place, and time.  Skin: Skin  is warm.  Psychiatric: He has a normal mood and affect. His behavior is normal. Judgment and thought content normal.     Assessment/Plan 82 year old male with chronic digital ischemia involving right index right long fingers with pain and exposed osseous structures. Have discussed with family and patient revision amputation to a more proximal level of the right index right long fingers. Patient understands risks and benefits and wished to proceed.  Marlowe Shores, MD 10/08/2017, 1:06 PM

## 2017-10-08 NOTE — Op Note (Signed)
Please see dictated report [id number]

## 2017-10-09 ENCOUNTER — Encounter (HOSPITAL_COMMUNITY): Payer: Self-pay | Admitting: Orthopedic Surgery

## 2017-10-09 DIAGNOSIS — Z79899 Other long term (current) drug therapy: Secondary | ICD-10-CM | POA: Diagnosis not present

## 2017-10-09 DIAGNOSIS — Z794 Long term (current) use of insulin: Secondary | ICD-10-CM | POA: Diagnosis not present

## 2017-10-09 DIAGNOSIS — Z5181 Encounter for therapeutic drug level monitoring: Secondary | ICD-10-CM | POA: Diagnosis not present

## 2017-10-09 NOTE — Anesthesia Postprocedure Evaluation (Signed)
Anesthesia Post Note  Patient: Charles Vasquez  Procedure(s) Performed: RIGHT INDEX, RIGHT LONG REVISION AMPUTATIONS (Right Finger)     Patient location during evaluation: PACU Anesthesia Type: General Level of consciousness: awake and alert Pain management: pain level controlled Vital Signs Assessment: post-procedure vital signs reviewed and stable Respiratory status: spontaneous breathing, nonlabored ventilation, respiratory function stable and patient connected to nasal cannula oxygen Cardiovascular status: blood pressure returned to baseline and stable Postop Assessment: no apparent nausea or vomiting Anesthetic complications: no    Last Vitals:  Vitals:   10/08/17 1805 10/08/17 1820  BP: (!) 169/78 (!) 166/73  Pulse:  75  Resp: 16 15  Temp:  (!) 36.3 C  SpO2: 95% 92%    Last Pain:  Vitals:   10/08/17 1820  TempSrc:   PainSc: 0-No pain                 Matilyn Fehrman

## 2017-10-10 ENCOUNTER — Other Ambulatory Visit: Payer: Self-pay

## 2017-10-10 DIAGNOSIS — Z992 Dependence on renal dialysis: Secondary | ICD-10-CM | POA: Diagnosis not present

## 2017-10-10 DIAGNOSIS — N186 End stage renal disease: Secondary | ICD-10-CM | POA: Diagnosis not present

## 2017-10-10 NOTE — Patient Outreach (Signed)
[facility]) Care Management  10/10/2017  Charles Vasquez 1936/02/09 [id number]   Medication Adherence call to Mrs. Charles Vasquez patient did not answer no voice mail set up patient is due on Losartan 50 mg. [name] is showing past due under [facility] Ins.  Charles Vasquez CPhT Pharmacy Technician [facility] Care Management Direct Dial [phone]  Fax [phone] [email or web address]

## 2017-10-13 DIAGNOSIS — N186 End stage renal disease: Secondary | ICD-10-CM | POA: Diagnosis not present

## 2017-10-13 DIAGNOSIS — Z992 Dependence on renal dialysis: Secondary | ICD-10-CM | POA: Diagnosis not present

## 2017-10-14 ENCOUNTER — Ambulatory Visit (INDEPENDENT_AMBULATORY_CARE_PROVIDER_SITE_OTHER): Payer: Medicare Other | Admitting: Orthopedic Surgery

## 2017-10-15 ENCOUNTER — Ambulatory Visit: Payer: Medicare Other | Admitting: Vascular Surgery

## 2017-10-15 DIAGNOSIS — N186 End stage renal disease: Secondary | ICD-10-CM | POA: Diagnosis not present

## 2017-10-15 DIAGNOSIS — Z992 Dependence on renal dialysis: Secondary | ICD-10-CM | POA: Diagnosis not present

## 2017-10-16 ENCOUNTER — Encounter (INDEPENDENT_AMBULATORY_CARE_PROVIDER_SITE_OTHER): Payer: Self-pay | Admitting: Orthopedic Surgery

## 2017-10-16 ENCOUNTER — Ambulatory Visit (INDEPENDENT_AMBULATORY_CARE_PROVIDER_SITE_OTHER): Payer: Medicare Other | Admitting: Orthopedic Surgery

## 2017-10-16 ENCOUNTER — Encounter: Payer: Medicare Other | Attending: Physical Medicine & Rehabilitation | Admitting: Physical Medicine & Rehabilitation

## 2017-10-16 ENCOUNTER — Encounter: Payer: Self-pay | Admitting: Physical Medicine & Rehabilitation

## 2017-10-16 VITALS — Ht 67.0 in | Wt 130.0 lb

## 2017-10-16 VITALS — BP 150/76 | HR 79 | Ht 67.0 in | Wt 130.0 lb

## 2017-10-16 DIAGNOSIS — Z833 Family history of diabetes mellitus: Secondary | ICD-10-CM | POA: Diagnosis not present

## 2017-10-16 DIAGNOSIS — Z8673 Personal history of transient ischemic attack (TIA), and cerebral infarction without residual deficits: Secondary | ICD-10-CM | POA: Insufficient documentation

## 2017-10-16 DIAGNOSIS — Z89511 Acquired absence of right leg below knee: Secondary | ICD-10-CM | POA: Insufficient documentation

## 2017-10-16 DIAGNOSIS — N186 End stage renal disease: Secondary | ICD-10-CM | POA: Insufficient documentation

## 2017-10-16 DIAGNOSIS — Z794 Long term (current) use of insulin: Secondary | ICD-10-CM | POA: Insufficient documentation

## 2017-10-16 DIAGNOSIS — T8131XD Disruption of external operation (surgical) wound, not elsewhere classified, subsequent encounter: Secondary | ICD-10-CM

## 2017-10-16 DIAGNOSIS — Z992 Dependence on renal dialysis: Secondary | ICD-10-CM | POA: Diagnosis not present

## 2017-10-16 DIAGNOSIS — R6889 Other general symptoms and signs: Secondary | ICD-10-CM | POA: Diagnosis not present

## 2017-10-16 DIAGNOSIS — R0989 Other specified symptoms and signs involving the circulatory and respiratory systems: Secondary | ICD-10-CM

## 2017-10-16 DIAGNOSIS — E1122 Type 2 diabetes mellitus with diabetic chronic kidney disease: Secondary | ICD-10-CM | POA: Insufficient documentation

## 2017-10-16 DIAGNOSIS — E43 Unspecified severe protein-calorie malnutrition: Secondary | ICD-10-CM

## 2017-10-16 DIAGNOSIS — Z09 Encounter for follow-up examination after completed treatment for conditions other than malignant neoplasm: Secondary | ICD-10-CM | POA: Insufficient documentation

## 2017-10-16 DIAGNOSIS — I70263 Atherosclerosis of native arteries of extremities with gangrene, bilateral legs: Secondary | ICD-10-CM | POA: Diagnosis not present

## 2017-10-16 DIAGNOSIS — Z8249 Family history of ischemic heart disease and other diseases of the circulatory system: Secondary | ICD-10-CM | POA: Diagnosis not present

## 2017-10-16 DIAGNOSIS — R269 Unspecified abnormalities of gait and mobility: Secondary | ICD-10-CM | POA: Diagnosis not present

## 2017-10-16 DIAGNOSIS — I252 Old myocardial infarction: Secondary | ICD-10-CM | POA: Diagnosis not present

## 2017-10-16 DIAGNOSIS — Z9889 Other specified postprocedural states: Secondary | ICD-10-CM | POA: Diagnosis not present

## 2017-10-16 DIAGNOSIS — S88111A Complete traumatic amputation at level between knee and ankle, right lower leg, initial encounter: Secondary | ICD-10-CM | POA: Diagnosis not present

## 2017-10-16 NOTE — Progress Notes (Signed)
Subjective:    Patient ID: Charles Vasquez, male    DOB: 04/05/1935, 82 y.o.   MRN: [id number]  HPI 82 year old right-handed male with history of hypertension, CAD, with end-stage renal disease, hemodialysis, as well as diabetes mellitus presents for follow up for right BKA.   Last clinic visit 09/18/17.  Since that time, pt had fingers amputated on RUE.  Family/Friends present who supplement history.  He completed therapies. He sees Vascular next week. He never heard back from wound center.  BP is slightly elevated, states he did not take his medications. Denies falls. He is currently at SNF, with plans to transition to long term facility.   Pain Inventory Average Pain 7 Pain Right Now 7 My pain is sharp  In the last 24 hours, has pain interfered with the following? General activity 7 Relation with others 7 Enjoyment of life 7 What TIME of day is your pain at its worst? morning Sleep (in general) Good Pain is worse with: unsure Pain improves with: medication Relief from Meds: 5  Mobility ability to climb steps?  no do you drive?  no use a wheelchair needs help with transfers  Function retired I need assistance with the following:  bathing, meal prep and household duties  Neuro/Psych tingling trouble walking  Prior Studies Any changes since last visit?  no  Physicians involved in your care Any changes since last visit?  no   Family History  Problem Relation Age of Onset  . Diabetes Mother   . Hypertension Mother   . AAA (abdominal aortic aneurysm) Mother   . Diabetes Father   . Hypertension Father    Social History   Socioeconomic History  . Marital status: Widowed    Spouse name: Not on file  . Number of children: Not on file  . Years of education: Not on file  . Highest education level: Not on file  Occupational History  . Not on file  Social Needs  . Financial resource strain: Not very hard  . Food insecurity:    Worry: Never true    Inability:  Never true  . Transportation needs:    Medical: No    Non-medical: No  Tobacco Use  . Smoking status: Never Smoker  . Smokeless tobacco: Never Used  Substance and Sexual Activity  . Alcohol use: No  . Drug use: No  . Sexual activity: Yes  Lifestyle  . Physical activity:    Days per week: 2 days    Minutes per session: 20 min  . Stress: Only a little  Relationships  . Social connections:    Talks on phone: Once a week    Gets together: Once a week    Attends religious service: 1 to 4 times per year    Active member of club or organization: Yes    Attends meetings of clubs or organizations: 1 to 4 times per year    Relationship status: Widowed  Other Topics Concern  . Not on file  Social History Narrative  . Not on file   Past Surgical History:  Procedure Laterality Date  . ABDOMINAL AORTOGRAM W/LOWER EXTREMITY N/A 06/25/2017   Procedure: ABDOMINAL AORTOGRAM W/LOWER EXTREMITY;  Surgeon: Maeola Harman, MD;  Location: [facility] INVASIVE CV LAB;  Service: Cardiovascular;  Laterality: N/A;  . AMPUTATION Right 01/01/2017   Procedure: REVISION AMPUTATION RIGHT INDEX FINGER;  Surgeon: Dairl Ponder, MD;  Location: [facility] OR;  Service: Orthopedics;  Laterality: Right;  . AMPUTATION Right 07/15/2017  Procedure: AMPUTATION BELOW KNEE RIGHT;  Surgeon: [name], MD;  Location: [facility] OR;  Service: Vascular;  Laterality: Right;  . AMPUTATION Right 10/08/2017   Procedure: RIGHT INDEX, RIGHT LONG REVISION AMPUTATIONS;  Surgeon: Dairl Ponder, MD;  Location: [facility] OR;  Service: Orthopedics;  Laterality: Right;  . ARTERIOVENOUS GRAFT PLACEMENT    . ARTERIOVENOUS GRAFT PLACEMENT Left 10/22/2016   thigh  . AV FISTULA PLACEMENT  03/31/2012   Procedure: ARTERIOVENOUS (AV) FISTULA CREATION;  Surgeon: [name], MD;  Location: [facility] OR;  Service: Vascular;  Laterality: Right;  First stage Brachial vein transposition  . AV FISTULA PLACEMENT Right 08/25/2012   Procedure: INSERTION OF  ARTERIOVENOUS (AV) GORE-TEX GRAFT ARM;  Surgeon: [name], MD;  Location: [facility] OR;  Service: Vascular;  Laterality: Right;  . AV FISTULA PLACEMENT Left 07/23/2016   Procedure: INSERTION OF ARTERIOVENOUS (AV) GORE-TEX GRAFT LEFT UPPER ARM;  Surgeon: [name], MD;  Location: [facility] OR;  Service: Vascular;  Laterality: Left;  . AV FISTULA PLACEMENT Left 10/22/2016   Procedure: INSERTION OF 4-76mm x 45cm  ARTERIOVENOUS (AV) GORE-TEX GRAFT THIGH-LEFT;  Surgeon: [name], MD;  Location: [facility] OR;  Service: Vascular;  Laterality: Left;  . CATARACT EXTRACTION W/ INTRAOCULAR LENS  IMPLANT, BILATERAL    . COLONOSCOPY N/A 03/25/2017   Dr. Darrick Penna: examined portion of ileum normal. Redundant left colon, stricture at anus   . ESOPHAGOGASTRODUODENOSCOPY N/A 03/25/2017   Dr. Darrick Penna: widely patent Schatzki ring at GE junction s/p dilation, atrophic gastritis, single duodenal AVM, non-bleeding  . INSERTION OF DIALYSIS CATHETER Left 05/05/2012   Procedure: INSERTION OF DIALYSIS CATHETER;  Surgeon: Chuck Hint, MD;  Location: [facility] OR;  Service: Vascular;  Laterality: Left;  . INSERTION OF DIALYSIS CATHETER Right 10/01/2016   Procedure: INSERTION OF DIALYSIS CATHETER- RIGHT FEMORAL;  Surgeon: [name], MD;  Location: [facility] OR;  Service: Vascular;  Laterality: Right;  . IR FLUORO GUIDE CV LINE RIGHT  06/10/2016  . IR GENERIC HISTORICAL  06/07/2016   IR US GUIDE VASC ACCESS RIGHT 06/07/2016 Oley Balm, MD [facility]-INTERV RAD  . IR THROMBECTOMY AV FISTULA W/THROMBOLYSIS/PTA INC/SHUNT/IMG RIGHT Right 06/07/2016  . IR US GUIDE VASC ACCESS RIGHT  06/10/2016  . LIGATION ARTERIOVENOUS GORTEX GRAFT Left 08/04/2016   Procedure: LIGATION ARTERIOVENOUS GORTEX GRAFT;  Surgeon: Larina Earthly, MD;  Location: [facility] OR;  Service: Vascular;  Laterality: Left;  . LIGATION ARTERIOVENOUS GORTEX GRAFT Right 11/26/2016   Procedure: LIGATION OF RIGHT UPPER ARM ARTERIOVENOUS GORTEX GRAFT;  Surgeon: [name], MD;  Location: [facility] OR;  Service:  Vascular;  Laterality: Right;  . LIGATION OF ARTERIOVENOUS  FISTULA Right 08/25/2012   Procedure: LIGATION OF ARTERIOVENOUS  FISTULA;  Surgeon: [name], MD;  Location: [facility] OR;  Service: Vascular;  Laterality: Right;  Ligation of right brachial vein transposition  . PERIPHERAL VASCULAR ATHERECTOMY  06/25/2017   Procedure: PERIPHERAL VASCULAR ATHERECTOMY;  Surgeon: Maeola Harman, MD;  Location: [facility] INVASIVE CV LAB;  Service: Cardiovascular;;  Rt. PT  . REMOVAL OF A DIALYSIS CATHETER Right 05/05/2012   Procedure: REMOVAL OF A DIALYSIS CATHETER;  Surgeon: Chuck Hint, MD;  Location: [facility] OR;  Service: Vascular;  Laterality: Right;  . REMOVAL OF A DIALYSIS CATHETER Right 10/01/2016   Procedure: REMOVAL OF A DIALYSIS CATHETER-RIGHT UPPER CHEST;  Surgeon: [name], MD;  Location: [facility] OR;  Service: Vascular;  Laterality: Right;  . REMOVAL OF A DIALYSIS CATHETER Right 11/26/2016   Procedure:  REMOVAL OF RIGHT FEMORAL TUNNEL DIALYSIS CATHETER;  Surgeon: [name], MD;  Location: [facility] OR;  Service: Vascular;  Laterality: Right;  . SPHINCTEROTOMY N/A 05/15/2017   Procedure: POSSIBLE SPHINCTEROTOMY (BOTOX);  Surgeon: Romie Levee, MD;  Location: [facility];  Service: General;  Laterality: N/A;  . TOE AMPUTATION  02/2007   left foot first 3 toes  . UPPER EXTREMITY VENOGRAPHY Bilateral 07/18/2016   Procedure: Bilateral Upper Extremity Venography;  Surgeon: [name], MD;  Location: [facility] INVASIVE CV LAB;  Service: Cardiovascular;  Laterality: Bilateral;   Past Medical History:  Diagnosis Date  . Anal pain   . AVF (arteriovenous fistula) (HCC) 05-13-2017 per pt currently AVF access used is left thigh   hx multiple AVF surgery's (previously left upper arm, bilateral thigh) last surgery -- right upper arm creation 11-26-2016  . ESRD (end stage renal disease) on dialysis [facility])    [facility] in [place], [place] on MWF   . History of CVA (cerebrovascular accident)     05-13-2017  per pt stated "thats what they told yrs ago"  per pt no residual  . Hyperlipidemia   . Hypertension   . Myocardial infarction (HCC)   . Stroke (HCC)   . Type 2 diabetes mellitus treated with insulin (HCC)    Type II   BP (!) 150/76   Pulse 79   Ht 5\' 7"  (1.702 m) Comment: states  Wt 130 lb (59 kg) Comment: states  SpO2 94%   BMI 20.36 kg/m   Opioid Risk Score:   Fall Risk Score:  `1  Depression screen PHQ 2/9  Depression screen PHQ 2/9 06/17/2017  Decreased Interest 0  Down, Depressed, Hopeless 0  PHQ - 2 Score 0    Review of Systems  Constitutional: Positive for unexpected weight change.  HENT: Negative.   Eyes: Negative.   Respiratory: Positive for cough.   Cardiovascular: Negative.   Gastrointestinal: Positive for nausea and vomiting.  Endocrine: Negative.   Musculoskeletal: Positive for arthralgias, gait problem and myalgias.  Skin: Negative.   Allergic/Immunologic: Negative.   Neurological: Positive for numbness.  Hematological: Negative.   Psychiatric/Behavioral: Negative.   All other systems reviewed and are negative.      Objective:   Physical Exam Constitutional: No distress . Vital signs reviewed. HENT: Normocephalic.  Atraumatic. Eyes: EOMI. No discharge. Cardiovascular: RRR. No JVD. Respiratory: CTA bilaterally. Normal effort. GI: BS +. Non-distended. MSK: Mild TTP in stump Right BKA tenderness, no edema Neurology: Alert HOH RUE: amputation of 2nd and 3rd digits at PIP LLE: amputation digits  Neurological: He is alert, poor historial Motor: B/l UE, LLE, Right hip flexion 4+-5/5 proximal to distal (stable) Skin: Right finger with dressing c/d/i BKA with dehisced area with serosanguinous drainage and fibrinous/necrotic tissue Left heal with ulcer    Assessment & Plan:  82 year old right-handed male with history of hypertension, CAD, with end-stage renal disease, hemodialysis, as well as diabetes mellitus presents for follow up for  right BKA.   1. Decreased functional mobility secondary to right BKA 07/15/2017  Cont HEP  Cont follow up with Vascuar  Now with wound dehiscence - Referral to Wound care previously made, ?no follow up.  Consider again for HBO to avoid proximal ambulation - notes provided to SNF  BID dressing changes to with wet/dry dressing  Currently at SNF  2.  Hypertension.    Labile at present per pt  Cont meds  Recs per Nephro  3. Gait abnormality  Cont HEP  Cont wheelchair for safety  >25 minutes spent with patient and family/friends, with >20 in counseling regarding wound care

## 2017-10-17 DIAGNOSIS — N186 End stage renal disease: Secondary | ICD-10-CM | POA: Diagnosis not present

## 2017-10-17 DIAGNOSIS — Z992 Dependence on renal dialysis: Secondary | ICD-10-CM | POA: Diagnosis not present

## 2017-10-20 DIAGNOSIS — Z992 Dependence on renal dialysis: Secondary | ICD-10-CM | POA: Diagnosis not present

## 2017-10-20 DIAGNOSIS — N186 End stage renal disease: Secondary | ICD-10-CM | POA: Diagnosis not present

## 2017-10-21 ENCOUNTER — Encounter: Payer: Self-pay | Admitting: Physician Assistant

## 2017-10-21 ENCOUNTER — Ambulatory Visit (INDEPENDENT_AMBULATORY_CARE_PROVIDER_SITE_OTHER): Payer: Medicare Other | Admitting: Physician Assistant

## 2017-10-21 ENCOUNTER — Other Ambulatory Visit: Payer: Self-pay

## 2017-10-21 VITALS — BP 161/68 | HR 77 | Temp 97.1°F | Resp 16 | Ht 67.0 in | Wt 126.0 lb

## 2017-10-21 DIAGNOSIS — I739 Peripheral vascular disease, unspecified: Secondary | ICD-10-CM

## 2017-10-21 DIAGNOSIS — S88111A Complete traumatic amputation at level between knee and ankle, right lower leg, initial encounter: Secondary | ICD-10-CM | POA: Diagnosis not present

## 2017-10-21 NOTE — Progress Notes (Signed)
[facility] [place] HISTORY AND PHYSICAL   History of Present Illness:  Patient is a 82 y.o. year old male who presents for evaluation of left LE and right BKA stump.  On his previous exam the right transtibial amputation had approximately 50% granulation tissue 50% fibrinous exudate with a thin gangrenous ulcer around the transtibial amputation residual limb.  There is no cellulitis no odor no exposed bone or tendon.  Examination of the left foot patient is status post amputation of the first 3 rays with dry gangrenous changes to the fourth toe.  Patient also has dry gangrenous changes to the left heel.    He was seen by Dr. Lajoyce Corners in consultation: Plan: Discussed with the patient and the family treatment options including amputations versus continued observation.  Will start patient on Trental 400 mg 3 times a day and continue with dressing changes to the right transtibial amputation and dry dressing changes to the left heel with a foam boots to unload pressure from the heel.  Discussed that if conservative treatment does not work patient would require an above-the-knee amputation on the right and amputation of the index and long finger of the right hand.  Patient also may require a left above-knee amputation as well.  Follow-Up Instructions: Return in about 1 month (around 10/14/2017).    He also had ischemic appearing R middle finger with no palpable pulses in R wrist.  He was seen in consultation by Dr. Marlowe Shores that required amputation.    He is here today for a f/u visit to check the healing of his right BKA.  Past medical history includes: HTN, DM and ESRD.  HD M-W-F.    Past Medical History:  Diagnosis Date  . Anal pain   . AVF (arteriovenous fistula) (HCC) 05-13-2017 per pt currently AVF access used is left thigh   hx multiple AVF surgery's (previously left upper arm, bilateral thigh) last surgery -- right upper arm creation 11-26-2016  . ESRD (end stage renal  disease) on dialysis [facility])    [facility] in [place], [place] on MWF   . History of CVA (cerebrovascular accident)    05-13-2017  per pt stated "thats what they told yrs ago"  per pt no residual  . Hyperlipidemia   . Hypertension   . Myocardial infarction (HCC)   . Stroke (HCC)   . Type 2 diabetes mellitus treated with insulin (HCC)    Type II    Past Surgical History:  Procedure Laterality Date  . ABDOMINAL AORTOGRAM W/LOWER EXTREMITY N/A 06/25/2017   Procedure: ABDOMINAL AORTOGRAM W/LOWER EXTREMITY;  Surgeon: Maeola Harman, MD;  Location: [facility] INVASIVE CV LAB;  Service: Cardiovascular;  Laterality: N/A;  . AMPUTATION Right 01/01/2017   Procedure: REVISION AMPUTATION RIGHT INDEX FINGER;  Surgeon: Dairl Ponder, MD;  Location: [facility] OR;  Service: Orthopedics;  Laterality: Right;  . AMPUTATION Right 07/15/2017   Procedure: AMPUTATION BELOW KNEE RIGHT;  Surgeon: [name], MD;  Location: [facility] OR;  Service: Vascular;  Laterality: Right;  . AMPUTATION Right 10/08/2017   Procedure: RIGHT INDEX, RIGHT LONG REVISION AMPUTATIONS;  Surgeon: Dairl Ponder, MD;  Location: [facility] OR;  Service: Orthopedics;  Laterality: Right;  . ARTERIOVENOUS GRAFT PLACEMENT    . ARTERIOVENOUS GRAFT PLACEMENT Left 10/22/2016   thigh  . AV FISTULA PLACEMENT  03/31/2012   Procedure: ARTERIOVENOUS (AV) FISTULA CREATION;  Surgeon: [name], MD;  Location: [facility] OR;  Service: Vascular;  Laterality: Right;  First  stage Brachial vein transposition  . AV FISTULA PLACEMENT Right 08/25/2012   Procedure: INSERTION OF ARTERIOVENOUS (AV) GORE-TEX GRAFT ARM;  Surgeon: [name], MD;  Location: [facility] OR;  Service: Vascular;  Laterality: Right;  . AV FISTULA PLACEMENT Left 07/23/2016   Procedure: INSERTION OF ARTERIOVENOUS (AV) GORE-TEX GRAFT LEFT UPPER ARM;  Surgeon: [name], MD;  Location: [facility] OR;  Service: Vascular;  Laterality: Left;  . AV FISTULA PLACEMENT Left 10/22/2016   Procedure: INSERTION OF 4-42mm x 45cm   ARTERIOVENOUS (AV) GORE-TEX GRAFT THIGH-LEFT;  Surgeon: [name], MD;  Location: [facility] OR;  Service: Vascular;  Laterality: Left;  . CATARACT EXTRACTION W/ INTRAOCULAR LENS  IMPLANT, BILATERAL    . COLONOSCOPY N/A 03/25/2017   Dr. Darrick Penna: examined portion of ileum normal. Redundant left colon, stricture at anus   . ESOPHAGOGASTRODUODENOSCOPY N/A 03/25/2017   Dr. Darrick Penna: widely patent Schatzki ring at GE junction s/p dilation, atrophic gastritis, single duodenal AVM, non-bleeding  . INSERTION OF DIALYSIS CATHETER Left 05/05/2012   Procedure: INSERTION OF DIALYSIS CATHETER;  Surgeon: Chuck Hint, MD;  Location: [facility] OR;  Service: Vascular;  Laterality: Left;  . INSERTION OF DIALYSIS CATHETER Right 10/01/2016   Procedure: INSERTION OF DIALYSIS CATHETER- RIGHT FEMORAL;  Surgeon: [name], MD;  Location: [facility] OR;  Service: Vascular;  Laterality: Right;  . IR FLUORO GUIDE CV LINE RIGHT  06/10/2016  . IR GENERIC HISTORICAL  06/07/2016   IR US GUIDE VASC ACCESS RIGHT 06/07/2016 Oley Balm, MD [facility]-INTERV RAD  . IR THROMBECTOMY AV FISTULA W/THROMBOLYSIS/PTA INC/SHUNT/IMG RIGHT Right 06/07/2016  . IR US GUIDE VASC ACCESS RIGHT  06/10/2016  . LIGATION ARTERIOVENOUS GORTEX GRAFT Left 08/04/2016   Procedure: LIGATION ARTERIOVENOUS GORTEX GRAFT;  Surgeon: Larina Earthly, MD;  Location: [facility] OR;  Service: Vascular;  Laterality: Left;  . LIGATION ARTERIOVENOUS GORTEX GRAFT Right 11/26/2016   Procedure: LIGATION OF RIGHT UPPER ARM ARTERIOVENOUS GORTEX GRAFT;  Surgeon: [name], MD;  Location: [facility] OR;  Service: Vascular;  Laterality: Right;  . LIGATION OF ARTERIOVENOUS  FISTULA Right 08/25/2012   Procedure: LIGATION OF ARTERIOVENOUS  FISTULA;  Surgeon: [name], MD;  Location: [facility] OR;  Service: Vascular;  Laterality: Right;  Ligation of right brachial vein transposition  . PERIPHERAL VASCULAR ATHERECTOMY  06/25/2017   Procedure: PERIPHERAL VASCULAR ATHERECTOMY;  Surgeon: Maeola Harman, MD;   Location: [facility] INVASIVE CV LAB;  Service: Cardiovascular;;  Rt. PT  . REMOVAL OF A DIALYSIS CATHETER Right 05/05/2012   Procedure: REMOVAL OF A DIALYSIS CATHETER;  Surgeon: Chuck Hint, MD;  Location: [facility] OR;  Service: Vascular;  Laterality: Right;  . REMOVAL OF A DIALYSIS CATHETER Right 10/01/2016   Procedure: REMOVAL OF A DIALYSIS CATHETER-RIGHT UPPER CHEST;  Surgeon: [name], MD;  Location: [facility] OR;  Service: Vascular;  Laterality: Right;  . REMOVAL OF A DIALYSIS CATHETER Right 11/26/2016   Procedure: REMOVAL OF RIGHT FEMORAL TUNNEL DIALYSIS CATHETER;  Surgeon: [name], MD;  Location: [facility] OR;  Service: Vascular;  Laterality: Right;  . SPHINCTEROTOMY N/A 05/15/2017   Procedure: POSSIBLE SPHINCTEROTOMY (BOTOX);  Surgeon: Romie Levee, MD;  Location: [facility];  Service: General;  Laterality: N/A;  . TOE AMPUTATION  02/2007   left foot first 3 toes  . UPPER EXTREMITY VENOGRAPHY Bilateral 07/18/2016   Procedure: Bilateral Upper Extremity Venography;  Surgeon: [name], MD;  Location: [facility] INVASIVE CV LAB;  Service: Cardiovascular;  Laterality: Bilateral;  ROS:   General:  No weight loss, Fever, chills  HEENT: No recent headaches, no nasal bleeding, no visual changes, no sore throat  Neurologic: No dizziness, blackouts, seizures. No recent symptoms of stroke or mini- stroke. No recent episodes of slurred speech, or temporary blindness.  Cardiac: No recent episodes of chest pain/pressure, no shortness of breath at rest.  No shortness of breath with exertion.  Denies history of atrial fibrillation or irregular heartbeat  Vascular: No history of rest pain in feet.  No history of claudication.  positive history of non-healing ulcer, No history of DVT   Pulmonary: No home oxygen, no productive cough, no hemoptysis,  No asthma or wheezing  Musculoskeletal:  [ ]  Arthritis, [ ]  Low back pain,  [ ]  Joint pain  Hematologic:No history of hypercoagulable state.  No  history of easy bleeding.  positive history of anemia  Gastrointestinal: No hematochezia or melena,  No gastroesophageal reflux, no trouble swallowing  Urinary: [ ]  chronic Kidney disease, [x ] on HD - [ ]  MWF or [ ]  TTHS, [ ]  Burning with urination, [ ]  Frequent urination, [ ]  Difficulty urinating;   Skin: No rashes  Psychological: No history of anxiety,  No history of depression  Social History Social History   Tobacco Use  . Smoking status: Never Smoker  . Smokeless tobacco: Never Used  Substance Use Topics  . Alcohol use: No  . Drug use: No    Family History Family History  Problem Relation Age of Onset  . Diabetes Mother   . Hypertension Mother   . AAA (abdominal aortic aneurysm) Mother   . Diabetes Father   . Hypertension Father     Allergies  Allergies  Allergen Reactions  . Lisinopril Other (See Comments)    HYPERKALEMIA RENAL DYSFUNCTION PROGRESSION     Current Outpatient Medications  Medication Sig Dispense Refill  . acetaminophen (TYLENOL) 650 MG CR tablet Take 650 mg by mouth every 8 (eight) hours as needed for pain.    . collagenase (SANTYL) ointment Apply 1 application topically daily. 15 g 0  . Darbepoetin Alfa (ARANESP) 200 MCG/0.4ML SOSY injection Inject 0.4 mLs (200 mcg total) into the vein every Monday with hemodialysis. 1.68 mL   . diclofenac sodium (VOLTAREN) 1 % GEL Apply 2 g topically 4 (four) times daily.    Marland Kitchen dicyclomine (BENTYL) 10 MG capsule 1 PO 30 MINUTES PRIOR TO BREAKFAST AND LUNCH (Patient taking differently: Take 10 mg by mouth 2 (two) times daily. ) 62 capsule 11  . doxercalciferol (HECTOROL) 4 MCG/2ML injection Inject 1 mL (2 mcg total) into the vein every Monday, Wednesday, and Friday with hemodialysis. 2 mL   . insulin glargine (LANTUS) 100 UNIT/ML injection Inject 0.08 mLs (8 Units total) into the skin every Monday, Wednesday, and Friday at 8 PM. (Patient taking differently: Inject 8 Units into the skin every Monday, Wednesday,  and Friday at 8 PM. ) 10 mL 11  . insulin lispro (HUMALOG) 100 UNIT/ML injection Inject 0-8 Units into the skin 3 (three) times daily before meals. Blood sugar of 70-100=0 units 101-250=1 unit 151-200=2 units 201-250=4 units  251-300=6 units 301-350= 8 units Greater than 350= 10 units **Call MD if glucose greater than 450**    . iron polysaccharides (NIFEREX) 150 MG capsule Take 1 capsule (150 mg total) by mouth 2 (two) times daily.    Marland Kitchen lidocaine (LIDODERM) 5 % Place 1 patch onto the skin daily. Remove & Discard patch within 12 hours or  as directed by MD 3 patch 0  . losartan (COZAAR) 50 MG tablet Take 50 mg by mouth every evening.     . multivitamin (RENA-VIT) TABS tablet Take 1 tablet by mouth daily.  0  . pantoprazole (PROTONIX) 40 MG tablet Take 1 tablet (40 mg total) by mouth 2 (two) times daily with a meal.    . pentoxifylline (TRENTAL) 400 MG CR tablet Take 1 tablet (400 mg total) by mouth 3 (three) times daily with meals. 90 tablet 3  . sevelamer carbonate (RENVELA) 800 MG tablet Take 2 tablets (1,600 mg total) by mouth 3 (three) times daily with meals.    . traMADol (ULTRAM) 50 MG tablet Take 1 tablet (50 mg total) by mouth every 6 (six) hours as needed. 6 tablet 0   No current facility-administered medications for this visit.     Physical Examination  Vitals:   10/21/17 1425 10/21/17 1428  BP: (!) 151/73 (!) 161/68  Pulse: 77 77  Temp: (!) 97.1 F (36.2 C)   TempSrc: Oral     There is no height or weight on file to calculate BMI.  General:  Alert and oriented, no acute distress HEENT: Normal Pulmonary: Clear to auscultation bilaterally Cardiac: Irregular heart rhythm  without murmur Abdomen: Soft, non-tender, non-distended, + BS Extremity Pulses:   radial,  Femoral B, non palpable dorsalis pedis, posterior tibial pulses bilaterally Musculoskeletal: right BKA with slowly healing stump wound  Neurologic: Motor intact UE/LE        ASSESSMENT:  Slow healing  right BKA incisional wound. 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 Lajoyce Corners Right index and middle fingers followed by Dr.  Mina Marble   PLAN:  Continue wet to dry dressing changes daily to right stump Follow up for wound check in 4-6 weeks.  If he has problems or concerns his family will call.  He currently resides in a SNF.  I did discover an irregular HR that has not been mentioned in his previous history.  I ask him to follow up with his primary care physician.      Mosetta Pigeon PA-C [facility] [place] Office: [phone]

## 2017-10-22 DIAGNOSIS — Z992 Dependence on renal dialysis: Secondary | ICD-10-CM | POA: Diagnosis not present

## 2017-10-22 DIAGNOSIS — N186 End stage renal disease: Secondary | ICD-10-CM | POA: Diagnosis not present

## 2017-10-24 ENCOUNTER — Telehealth: Payer: Self-pay | Admitting: Physical Medicine & Rehabilitation

## 2017-10-24 DIAGNOSIS — N186 End stage renal disease: Secondary | ICD-10-CM | POA: Diagnosis not present

## 2017-10-24 DIAGNOSIS — Z992 Dependence on renal dialysis: Secondary | ICD-10-CM | POA: Diagnosis not present

## 2017-10-24 NOTE — Telephone Encounter (Signed)
Sister called and states Rehab at center is denied because he has reached limit.  She states this was when his fingers were just amputated.  She states that rehab came very early on after amputation and really did not do therapy but it was counted as part of therapy.  He now is getting denial at [facility] center that he maxed his limit- she would like to know if you could write him a letter to get extension or more - he is in a facility and cannot at this point have any TX.

## 2017-10-26 NOTE — Telephone Encounter (Signed)
We can write him a referral, but unfortunately, that will not change his annual limit per his insurance.

## 2017-10-27 DIAGNOSIS — N186 End stage renal disease: Secondary | ICD-10-CM | POA: Diagnosis not present

## 2017-10-27 DIAGNOSIS — Z992 Dependence on renal dialysis: Secondary | ICD-10-CM | POA: Diagnosis not present

## 2017-10-27 NOTE — Telephone Encounter (Signed)
They are requesting the MD write a letter to justify the need to further treatment.

## 2017-10-27 NOTE — Telephone Encounter (Signed)
I will discuss it with them at their next appointment.  Thanks.

## 2017-10-28 NOTE — Telephone Encounter (Signed)
Called pt no answer °

## 2017-10-28 NOTE — Telephone Encounter (Signed)
Left message on sister ([name])voicemail with information that Dr Allena Katz will discuss at appt which is Thursday 11/13/17 arrive 10:00 for 10:20 appt. Requested call back to office if further questions.

## 2017-10-29 DIAGNOSIS — N186 End stage renal disease: Secondary | ICD-10-CM | POA: Diagnosis not present

## 2017-10-29 DIAGNOSIS — Z992 Dependence on renal dialysis: Secondary | ICD-10-CM | POA: Diagnosis not present

## 2017-10-31 DIAGNOSIS — Z992 Dependence on renal dialysis: Secondary | ICD-10-CM | POA: Diagnosis not present

## 2017-10-31 DIAGNOSIS — N186 End stage renal disease: Secondary | ICD-10-CM | POA: Diagnosis not present

## 2017-11-03 DIAGNOSIS — Z992 Dependence on renal dialysis: Secondary | ICD-10-CM | POA: Diagnosis not present

## 2017-11-03 DIAGNOSIS — N186 End stage renal disease: Secondary | ICD-10-CM | POA: Diagnosis not present

## 2017-11-04 ENCOUNTER — Encounter (INDEPENDENT_AMBULATORY_CARE_PROVIDER_SITE_OTHER): Payer: Self-pay | Admitting: Orthopedic Surgery

## 2017-11-04 NOTE — Progress Notes (Signed)
Office Visit Note   Patient: Charles Vasquez           Date of Birth: 1935/07/20           MRN: [id number] Visit Date: 10/16/2017              Requested by: Toma Deiters, MD [address] [place], [place] [address] PCP: Toma Deiters, MD  Chief Complaint  Patient presents with  . Right Leg - Wound Check  . Left Foot - Wound Check      HPI: Patient is a 82 year old gentleman with a right transtibial amputation with severe peripheral vascular disease bilaterally with a black left heel ulcer and ischemic changes to the right transtibial amputation.  Assessment & Plan: Visit Diagnoses:  1. 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  We will have him continue with his Trental to help with the microcirculation continue with wound care.  Follow-Up Instructions: Return in about 3 weeks (around 11/06/2017).   Ortho Exam  Patient is alert, oriented, no adenopathy, well-dressed, normal affect, normal respiratory effort. Examination patient has a 6 cm diameter black eschar over the left heel.  This is dry there is no drainage no odor no ascending cellulitis.  Examination of the right transtibial amputation patient has ischemic ulceration.  This is also dry there is no purulent drainage no odor no cellulitis.  Patient also has amputation of multiple fingers on the right hand.  Patient currently rests with the lateral aspect of the right transtibial amputation resting on the bed causing the pressure ulcer.  Patient has severe protein caloric malnutrition.  With history of uncontrolled type 2 diabetes.  Imaging: No results found. No images are attached to the encounter.  Labs: Lab Results  Component Value Date   HGBA1C 6.6 (H) 10/08/2017   HGBA1C 7.1 (H) 12/24/2016   HGBA1C 8.8  (H) 08/10/2016   ESRSEDRATE 110 (H) 07/12/2017   CRP 21.4 (H) 07/12/2017   REPTSTATUS 07/17/2017 FINAL 07/12/2017   GRAMSTAIN  08/04/2016    RARE WBC PRESENT, PREDOMINANTLY PMN FEW GRAM NEGATIVE RODS FEW GRAM NEGATIVE DIPLOCOCCI RARE GRAM POSITIVE COCCI IN CLUSTERS    CULT  07/12/2017    NO GROWTH 5 DAYS Performed at [facility], [address], [place], [place] [address]    [name] PROTEUS SPECIES 07/12/2017   LABORGA STREPTOCOCCUS PARASANGUINIS 07/12/2017     Lab Results  Component Value Date   ALBUMIN 1.9 (L) 07/31/2017   ALBUMIN 1.9 (L) 07/28/2017   ALBUMIN 2.0 (L) 07/25/2017    Body mass index is 20.36 kg/m.  Orders:  No orders of the defined types were placed in this encounter.  No orders of the defined types were placed in this encounter.    Procedures: No procedures performed  Clinical Data: No additional findings.  ROS:  All other systems negative, except as noted in the HPI. Review of Systems  Objective: Vital Signs: Ht 5\' 7"  (1.702 m)   Wt 130 lb (59 kg)   BMI 20.36 kg/m   Specialty Comments:  No specialty comments available.  PMFS History: Patient Active Problem List   Diagnosis Date Noted  . Osteomyelitis of finger of right hand (HCC) 09/24/2017  . Severe protein-calorie malnutrition (HCC) 09/16/2017  . Atherosclerosis of  native artery of both lower extremities with gangrene (HCC) 09/16/2017  . Labile blood pressure   . Chronic left shoulder pain   . Bacteremia   . Labile blood glucose   . Hypertensive crisis   . Hypoglycemia   . Leukocytosis   . Acute blood loss anemia   . Anemia of chronic disease   . ESRD on dialysis (HCC)   . Type 2 diabetes mellitus with peripheral neuropathy (HCC)   . Benign essential HTN   . 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 Pressure injury of skin 07/13/2017  . Diabetic foot infection (HCC) 07/12/2017  . CAD (coronary artery disease) 07/12/2017  .  Type II diabetes mellitus with renal manifestations (HCC) 07/12/2017  . Wound of right foot   . Loose stools 05/20/2017  . Abdominal pain   . Loss of weight 03/18/2017  . Abnormal CT scan, colon 03/18/2017  . Melena 03/18/2017  . Periumbilical abdominal pain 03/18/2017  . ESRD (end stage renal disease) on dialysis (HCC) 10/22/2016  . Venous hypertension status post AVG procedure 08/10/2016  . Anasarca 08/04/2016  . Facial swelling 08/04/2016  . Effusion, other site 08/04/2016  . 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 Acute respiratory failure (HCC) 08/04/2016  . Hyperkalemia 08/04/2016  . Aftercare following surgery of the circulatory system, NEC-Right  AVGG 09/25/2012  . Other complications due to renal dialysis device, implant, and graft 05/01/2012  . End stage renal disease (HCC) 03/17/2012  . FATIGUE, ACUTE 08/22/2008  . HEMATURIA UNSPECIFIED 07/11/2008  . Diabetes (HCC) 01/19/2008  . Essential hypertension 01/19/2008  . MYOCARDIAL INFARCTION, HX OF 01/19/2008  . GERD 01/19/2008  . OSTEOARTHRITIS 01/19/2008  . CEREBROVASCULAR ACCIDENT, HX OF 01/19/2008   Past Medical History:  Diagnosis Date  . Anal pain   . AVF (arteriovenous fistula) (HCC) 05-13-2017 per pt currently AVF access used is left thigh   hx multiple AVF surgery's (previously left upper arm, bilateral thigh) last surgery -- right upper arm creation 11-26-2016  . ESRD (end stage renal disease) on dialysis [facility])    [facility] in [place], [place] on MWF   . History of CVA (cerebrovascular accident)    05-13-2017  per pt stated "thats what they told yrs ago"  per pt no residual  . Hyperlipidemia   . Hypertension   . Myocardial infarction (HCC)   . Stroke (HCC)   . Type 2 diabetes mellitus treated with insulin (HCC)    Type II    Family History  Problem Relation Age of Onset  . Diabetes Mother   . Hypertension Mother   . AAA (abdominal aortic aneurysm) Mother   . Diabetes Father   . Hypertension Father     Past  Surgical History:  Procedure Laterality Date  . ABDOMINAL AORTOGRAM W/LOWER EXTREMITY N/A 06/25/2017   Procedure: ABDOMINAL AORTOGRAM W/LOWER EXTREMITY;  Surgeon: Maeola Harman, MD;  Location: [facility] INVASIVE CV LAB;  Service: Cardiovascular;  Laterality: N/A;  . AMPUTATION Right 01/01/2017   Procedure: REVISION AMPUTATION RIGHT INDEX FINGER;  Surgeon: Dairl Ponder, MD;  Location: [facility] OR;  Service: Orthopedics;  Laterality: Right;  . AMPUTATION Right 07/15/2017   Procedure: AMPUTATION BELOW KNEE RIGHT;  Surgeon: [name], MD;  Location: [facility] OR;  Service: Vascular;  Laterality: Right;  . AMPUTATION Right 10/08/2017   Procedure: RIGHT INDEX, RIGHT LONG REVISION AMPUTATIONS;  Surgeon: Dairl Ponder, MD;  Location: [facility] OR;  Service: Orthopedics;  Laterality: Right;  . ARTERIOVENOUS GRAFT PLACEMENT    .  ARTERIOVENOUS GRAFT PLACEMENT Left 10/22/2016   thigh  . AV FISTULA PLACEMENT  03/31/2012   Procedure: ARTERIOVENOUS (AV) FISTULA CREATION;  Surgeon: [name], MD;  Location: [facility] OR;  Service: Vascular;  Laterality: Right;  First stage Brachial vein transposition  . AV FISTULA PLACEMENT Right 08/25/2012   Procedure: INSERTION OF ARTERIOVENOUS (AV) GORE-TEX GRAFT ARM;  Surgeon: [name], MD;  Location: [facility] OR;  Service: Vascular;  Laterality: Right;  . AV FISTULA PLACEMENT Left 07/23/2016   Procedure: INSERTION OF ARTERIOVENOUS (AV) GORE-TEX GRAFT LEFT UPPER ARM;  Surgeon: [name], MD;  Location: [facility] OR;  Service: Vascular;  Laterality: Left;  . AV FISTULA PLACEMENT Left 10/22/2016   Procedure: INSERTION OF 4-36mm x 45cm  ARTERIOVENOUS (AV) GORE-TEX GRAFT THIGH-LEFT;  Surgeon: [name], MD;  Location: [facility] OR;  Service: Vascular;  Laterality: Left;  . CATARACT EXTRACTION W/ INTRAOCULAR LENS  IMPLANT, BILATERAL    . COLONOSCOPY N/A 03/25/2017   Dr. Darrick Penna: examined portion of ileum normal. Redundant left colon, stricture at anus   . ESOPHAGOGASTRODUODENOSCOPY N/A 03/25/2017    Dr. Darrick Penna: widely patent Schatzki ring at GE junction s/p dilation, atrophic gastritis, single duodenal AVM, non-bleeding  . INSERTION OF DIALYSIS CATHETER Left 05/05/2012   Procedure: INSERTION OF DIALYSIS CATHETER;  Surgeon: Chuck Hint, MD;  Location: [facility] OR;  Service: Vascular;  Laterality: Left;  . INSERTION OF DIALYSIS CATHETER Right 10/01/2016   Procedure: INSERTION OF DIALYSIS CATHETER- RIGHT FEMORAL;  Surgeon: [name], MD;  Location: [facility] OR;  Service: Vascular;  Laterality: Right;  . IR FLUORO GUIDE CV LINE RIGHT  06/10/2016  . IR GENERIC HISTORICAL  06/07/2016   IR US GUIDE VASC ACCESS RIGHT 06/07/2016 Oley Balm, MD [facility]-INTERV RAD  . IR THROMBECTOMY AV FISTULA W/THROMBOLYSIS/PTA INC/SHUNT/IMG RIGHT Right 06/07/2016  . IR US GUIDE VASC ACCESS RIGHT  06/10/2016  . LIGATION ARTERIOVENOUS GORTEX GRAFT Left 08/04/2016   Procedure: LIGATION ARTERIOVENOUS GORTEX GRAFT;  Surgeon: Larina Earthly, MD;  Location: [facility] OR;  Service: Vascular;  Laterality: Left;  . LIGATION ARTERIOVENOUS GORTEX GRAFT Right 11/26/2016   Procedure: LIGATION OF RIGHT UPPER ARM ARTERIOVENOUS GORTEX GRAFT;  Surgeon: [name], MD;  Location: [facility] OR;  Service: Vascular;  Laterality: Right;  . LIGATION OF ARTERIOVENOUS  FISTULA Right 08/25/2012   Procedure: LIGATION OF ARTERIOVENOUS  FISTULA;  Surgeon: [name], MD;  Location: [facility] OR;  Service: Vascular;  Laterality: Right;  Ligation of right brachial vein transposition  . PERIPHERAL VASCULAR ATHERECTOMY  06/25/2017   Procedure: PERIPHERAL VASCULAR ATHERECTOMY;  Surgeon: Maeola Harman, MD;  Location: [facility] INVASIVE CV LAB;  Service: Cardiovascular;;  Rt. PT  . REMOVAL OF A DIALYSIS CATHETER Right 05/05/2012   Procedure: REMOVAL OF A DIALYSIS CATHETER;  Surgeon: Chuck Hint, MD;  Location: [facility] OR;  Service: Vascular;  Laterality: Right;  . REMOVAL OF A DIALYSIS CATHETER Right 10/01/2016   Procedure: REMOVAL OF A DIALYSIS CATHETER-RIGHT UPPER CHEST;   Surgeon: [name], MD;  Location: [facility] OR;  Service: Vascular;  Laterality: Right;  . REMOVAL OF A DIALYSIS CATHETER Right 11/26/2016   Procedure: REMOVAL OF RIGHT FEMORAL TUNNEL DIALYSIS CATHETER;  Surgeon: [name], MD;  Location: [facility] OR;  Service: Vascular;  Laterality: Right;  . SPHINCTEROTOMY N/A 05/15/2017   Procedure: POSSIBLE SPHINCTEROTOMY (BOTOX);  Surgeon: Romie Levee, MD;  Location: [facility];  Service: General;  Laterality: N/A;  . TOE AMPUTATION  02/2007  left foot first 3 toes  . UPPER EXTREMITY VENOGRAPHY Bilateral 07/18/2016   Procedure: Bilateral Upper Extremity Venography;  Surgeon: [name], MD;  Location: [facility] INVASIVE CV LAB;  Service: Cardiovascular;  Laterality: Bilateral;   Social History   Occupational History  . Not on file  Tobacco Use  . Smoking status: Never Smoker  . Smokeless tobacco: Never Used  Substance and Sexual Activity  . Alcohol use: No  . Drug use: No  . Sexual activity: Yes

## 2017-11-05 DIAGNOSIS — Z992 Dependence on renal dialysis: Secondary | ICD-10-CM | POA: Diagnosis not present

## 2017-11-05 DIAGNOSIS — N186 End stage renal disease: Secondary | ICD-10-CM | POA: Diagnosis not present

## 2017-11-06 ENCOUNTER — Encounter (INDEPENDENT_AMBULATORY_CARE_PROVIDER_SITE_OTHER): Payer: Self-pay | Admitting: Orthopedic Surgery

## 2017-11-06 ENCOUNTER — Ambulatory Visit (INDEPENDENT_AMBULATORY_CARE_PROVIDER_SITE_OTHER): Payer: Medicare Other | Admitting: Physician Assistant

## 2017-11-06 VITALS — Ht 67.0 in | Wt 126.0 lb

## 2017-11-06 DIAGNOSIS — Z89511 Acquired absence of right leg below knee: Secondary | ICD-10-CM

## 2017-11-06 DIAGNOSIS — I70263 Atherosclerosis of native arteries of extremities with gangrene, bilateral legs: Secondary | ICD-10-CM

## 2017-11-07 ENCOUNTER — Encounter (INDEPENDENT_AMBULATORY_CARE_PROVIDER_SITE_OTHER): Payer: Self-pay | Admitting: Physician Assistant

## 2017-11-07 DIAGNOSIS — N186 End stage renal disease: Secondary | ICD-10-CM | POA: Diagnosis not present

## 2017-11-07 DIAGNOSIS — Z992 Dependence on renal dialysis: Secondary | ICD-10-CM | POA: Diagnosis not present

## 2017-11-07 NOTE — Progress Notes (Signed)
Office Visit Note   Patient: Charles Vasquez           Date of Birth: 1935-09-24           MRN: [id number] Visit Date: 11/06/2017              Requested by: Toma Deiters, MD [address] [place], [place] [address] PCP: Toma Deiters, MD   Assessment & Plan: Visit Diagnoses:  1. History of right below knee amputation (HCC)   2. 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  Recommend continued dressing changes to the right transtibial amputation, they could start some silver collagen type dressings to this area as he does have some pink granulation.  We have also recommended repositioning the right transtibial amputation with pillows or other bolster as the patient does tend to roll the leg out laterally creating pressure over the residual wound of the right transtibial amputation.  Recommend continue dry dressing as needed to the left heel and left fourth toe and continued use of foam boot to offload any pressure to the heel.  Orders were sent concerning all this to [facility] rehab center where he currently resides.  Follow-up here in 4 weeks.  Follow-Up Instructions: Return in about 4 weeks (around 12/04/2017).   Orders:  No orders of the defined types were placed in this encounter.  No orders of the defined types were placed in this encounter.     Procedures: No procedures performed   Clinical Data: No additional findings.   Subjective: Chief Complaint  Patient presents with  . Right Leg - Follow-up  . Left Leg - Follow-up    HPI  Patient is a 82 year old male who is seen today for follow-up of his right transtibial amputation and dry gangrene of his left heel and left fourth toe.  He comes in today with his family.  They are concerned about the dry gangrene over the left foot and residual wound over the right transtibial amputation.  He has been started on Trental 400  mg 3 times daily and he continues on dressing changes to the right tran[facility]ial [facility] where [facility] is currently a resident. Patient also underwent further right finger amputations per Dr.Weingold due to dry gangrene as well.  He has a past medical history also significant for diabetes and end-stage renal disease with the patient requiring hemodialysis 3 times weekly.   Review of Systems   Objective: Vital Signs: Ht 5\' 7"  (1.702 m)   Wt 126 lb (57.2 kg)   BMI 19.73 kg/m   Physical Exam Patient is an elderly chronically ill-appearing male, he is alert and oriented but slowed mentation.  He presents in a wheelchair. Ortho Exam Examination of the right transtibial amputation shows an open distal incision with a residual wound which is approximately 7 x 2 cm x 1 to 2 mm with 85% pink granulation and 25% fibrous tissue.  No periwound cellulitis or other signs of infection.  Left foot shows previous well-healed first through third toe amputations.  The fourth toe is black and open distally.  The left heel has a firmly adherent black eschar consistent with dry gangrene of the heel as well. Specialty Comments:  No specialty comments available.  Imaging: No results found.   PMFS History: Patient Active Problem List   Diagnosis Date Noted  . Osteomyelitis  of finger of right hand (HCC) 09/24/2017  . Severe protein-calorie malnutrition (HCC) 09/16/2017  . 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 Labile blood pressure   . Chronic left shoulder pain   . Bacteremia   . Labile blood glucose   . Hypertensive crisis   . Hypoglycemia   . Leukocytosis   . Acute blood loss anemia   . Anemia of chronic disease   . ESRD on dialysis (HCC)   . Type 2 diabetes mellitus with peripheral neuropathy (HCC)   . Benign essential HTN   . 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  Pressure injury of skin 07/13/2017  . Diabetic foot infection (HCC) 07/12/2017  . CAD (coronary artery disease) 07/12/2017  . Type II diabetes mellitus with renal manifestations (HCC) 07/12/2017  . Wound of right foot   . Loose stools 05/20/2017  . Abdominal pain   . Loss of weight 03/18/2017  . Abnormal CT scan, colon 03/18/2017  . Melena 03/18/2017  . Periumbilical abdominal pain 03/18/2017  . ESRD (end stage renal disease) on dialysis (HCC) 10/22/2016  . Venous hypertension status post AVG procedure 08/10/2016  . Anasarca 08/04/2016  . Facial swelling 08/04/2016  . Effusion, other site 08/04/2016  . 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 Acute respiratory failure (HCC) 08/04/2016  . Hyperkalemia 08/04/2016  . Aftercare following surgery of the circulatory system, NEC-Right  AVGG 09/25/2012  . Other complications due to renal dialysis device, implant, and graft 05/01/2012  . End stage renal disease (HCC) 03/17/2012  . FATIGUE, ACUTE 08/22/2008  . HEMATURIA UNSPECIFIED 07/11/2008  . Diabetes (HCC) 01/19/2008  . Essential hypertension 01/19/2008  . MYOCARDIAL INFARCTION, HX OF 01/19/2008  . GERD 01/19/2008  . OSTEOARTHRITIS 01/19/2008  . CEREBROVASCULAR ACCIDENT, HX OF 01/19/2008   Past Medical History:  Diagnosis Date  . Anal pain   . AVF (arteriovenous fistula) (HCC) 05-13-2017 per pt currently AVF access used is left thigh   hx multiple AVF surgery's (previously left upper arm, bilateral thigh) last surgery -- right upper arm creation 11-26-2016  . ESRD (end stage renal disease) on dialysis [facility])    [facility] in [place], [place] on MWF   . History of CVA (cerebrovascular accident)    05-13-2017  per pt stated "thats what they told yrs ago"  per pt no residual  . Hyperlipidemia   . Hypertension   . Myocardial infarction (HCC)   . Stroke (HCC)   . Type 2 diabetes mellitus treated with insulin (HCC)    Type II    Family History  Problem Relation Age of Onset  . Diabetes  Mother   . Hypertension Mother   . AAA (abdominal aortic aneurysm) Mother   . Diabetes Father   . Hypertension Father     Past Surgical History:  Procedure Laterality Date  . ABDOMINAL AORTOGRAM W/LOWER EXTREMITY N/A 06/25/2017   Procedure: ABDOMINAL AORTOGRAM W/LOWER EXTREMITY;  Surgeon: Maeola Harman, MD;  Location: [facility] INVASIVE CV LAB;  Service: Cardiovascular;  Laterality: N/A;  . AMPUTATION Right 01/01/2017   Procedure: REVISION AMPUTATION RIGHT INDEX FINGER;  Surgeon: Dairl Ponder, MD;  Location: [facility] OR;  Service: Orthopedics;  Laterality: Right;  . AMPUTATION Right 07/15/2017   Procedure: AMPUTATION BELOW KNEE RIGHT;  Surgeon: [name], MD;  Location: [facility] OR;  Service: Vascular;  Laterality: Right;  . AMPUTATION Right 10/08/2017   Procedure: RIGHT INDEX, RIGHT LONG REVISION AMPUTATIONS;  Surgeon:  Dairl Ponder, MD;  Location: [facility] OR;  Service: Orthopedics;  Laterality: Right;  . ARTERIOVENOUS GRAFT PLACEMENT    . ARTERIOVENOUS GRAFT PLACEMENT Left 10/22/2016   thigh  . AV FISTULA PLACEMENT  03/31/2012   Procedure: ARTERIOVENOUS (AV) FISTULA CREATION;  Surgeon: [name], MD;  Location: [facility] OR;  Service: Vascular;  Laterality: Right;  First stage Brachial vein transposition  . AV FISTULA PLACEMENT Right 08/25/2012   Procedure: INSERTION OF ARTERIOVENOUS (AV) GORE-TEX GRAFT ARM;  Surgeon: [name], MD;  Location: [facility] OR;  Service: Vascular;  Laterality: Right;  . AV FISTULA PLACEMENT Left 07/23/2016   Procedure: INSERTION OF ARTERIOVENOUS (AV) GORE-TEX GRAFT LEFT UPPER ARM;  Surgeon: [name], MD;  Location: [facility] OR;  Service: Vascular;  Laterality: Left;  . AV FISTULA PLACEMENT Left 10/22/2016   Procedure: INSERTION OF 4-40mm x 45cm  ARTERIOVENOUS (AV) GORE-TEX GRAFT THIGH-LEFT;  Surgeon: [name], MD;  Location: [facility] OR;  Service: Vascular;  Laterality: Left;  . CATARACT EXTRACTION W/ INTRAOCULAR LENS  IMPLANT, BILATERAL    . COLONOSCOPY N/A 03/25/2017   Dr.  Darrick Penna: examined portion of ileum normal. Redundant left colon, stricture at anus   . ESOPHAGOGASTRODUODENOSCOPY N/A 03/25/2017   Dr. Darrick Penna: widely patent Schatzki ring at GE junction s/p dilation, atrophic gastritis, single duodenal AVM, non-bleeding  . INSERTION OF DIALYSIS CATHETER Left 05/05/2012   Procedure: INSERTION OF DIALYSIS CATHETER;  Surgeon: Chuck Hint, MD;  Location: [facility] OR;  Service: Vascular;  Laterality: Left;  . INSERTION OF DIALYSIS CATHETER Right 10/01/2016   Procedure: INSERTION OF DIALYSIS CATHETER- RIGHT FEMORAL;  Surgeon: [name], MD;  Location: [facility] OR;  Service: Vascular;  Laterality: Right;  . IR FLUORO GUIDE CV LINE RIGHT  06/10/2016  . IR GENERIC HISTORICAL  06/07/2016   IR US GUIDE VASC ACCESS RIGHT 06/07/2016 Oley Balm, MD [facility]-INTERV RAD  . IR THROMBECTOMY AV FISTULA W/THROMBOLYSIS/PTA INC/SHUNT/IMG RIGHT Right 06/07/2016  . IR US GUIDE VASC ACCESS RIGHT  06/10/2016  . LIGATION ARTERIOVENOUS GORTEX GRAFT Left 08/04/2016   Procedure: LIGATION ARTERIOVENOUS GORTEX GRAFT;  Surgeon: Larina Earthly, MD;  Location: [facility] OR;  Service: Vascular;  Laterality: Left;  . LIGATION ARTERIOVENOUS GORTEX GRAFT Right 11/26/2016   Procedure: LIGATION OF RIGHT UPPER ARM ARTERIOVENOUS GORTEX GRAFT;  Surgeon: [name], MD;  Location: [facility] OR;  Service: Vascular;  Laterality: Right;  . LIGATION OF ARTERIOVENOUS  FISTULA Right 08/25/2012   Procedure: LIGATION OF ARTERIOVENOUS  FISTULA;  Surgeon: [name], MD;  Location: [facility] OR;  Service: Vascular;  Laterality: Right;  Ligation of right brachial vein transposition  . PERIPHERAL VASCULAR ATHERECTOMY  06/25/2017   Procedure: PERIPHERAL VASCULAR ATHERECTOMY;  Surgeon: Maeola Harman, MD;  Location: [facility] INVASIVE CV LAB;  Service: Cardiovascular;;  Rt. PT  . REMOVAL OF A DIALYSIS CATHETER Right 05/05/2012   Procedure: REMOVAL OF A DIALYSIS CATHETER;  Surgeon: Chuck Hint, MD;  Location: [facility] OR;  Service: Vascular;   Laterality: Right;  . REMOVAL OF A DIALYSIS CATHETER Right 10/01/2016   Procedure: REMOVAL OF A DIALYSIS CATHETER-RIGHT UPPER CHEST;  Surgeon: [name], MD;  Location: [facility] OR;  Service: Vascular;  Laterality: Right;  . REMOVAL OF A DIALYSIS CATHETER Right 11/26/2016   Procedure: REMOVAL OF RIGHT FEMORAL TUNNEL DIALYSIS CATHETER;  Surgeon: [name], MD;  Location: [facility] OR;  Service: Vascular;  Laterality: Right;  . SPHINCTEROTOMY N/A 05/15/2017   Procedure: POSSIBLE SPHINCTEROTOMY (BOTOX);  Surgeon: Maisie Fus,  Helmut Muster, MD;  Location: [facility];  Service: General;  Laterality: N/A;  . TOE AMPUTATION  02/2007   left foot first 3 toes  . UPPER EXTREMITY VENOGRAPHY Bilateral 07/18/2016   Procedure: Bilateral Upper Extremity Venography;  Surgeon: [name], MD;  Location: [facility] INVASIVE CV LAB;  Service: Cardiovascular;  Laterality: Bilateral;   Social History   Occupational History  . Not on file  Tobacco Use  . Smoking status: Never Smoker  . Smokeless tobacco: Never Used  Substance and Sexual Activity  . Alcohol use: No  . Drug use: No  . Sexual activity: Yes

## 2017-11-10 DIAGNOSIS — N186 End stage renal disease: Secondary | ICD-10-CM | POA: Diagnosis not present

## 2017-11-10 DIAGNOSIS — Z992 Dependence on renal dialysis: Secondary | ICD-10-CM | POA: Diagnosis not present

## 2017-11-10 DIAGNOSIS — Z23 Encounter for immunization: Secondary | ICD-10-CM | POA: Diagnosis not present

## 2017-11-12 DIAGNOSIS — N186 End stage renal disease: Secondary | ICD-10-CM | POA: Diagnosis not present

## 2017-11-12 DIAGNOSIS — Z23 Encounter for immunization: Secondary | ICD-10-CM | POA: Diagnosis not present

## 2017-11-12 DIAGNOSIS — Z992 Dependence on renal dialysis: Secondary | ICD-10-CM | POA: Diagnosis not present

## 2017-11-13 ENCOUNTER — Other Ambulatory Visit: Payer: Self-pay

## 2017-11-13 ENCOUNTER — Encounter: Payer: Medicare Other | Attending: Physical Medicine & Rehabilitation | Admitting: Physical Medicine & Rehabilitation

## 2017-11-13 ENCOUNTER — Encounter: Payer: Self-pay | Admitting: Physical Medicine & Rehabilitation

## 2017-11-13 VITALS — BP 154/76 | HR 76 | Ht 67.0 in | Wt 126.0 lb

## 2017-11-13 DIAGNOSIS — T8131XD Disruption of external operation (surgical) wound, not elsewhere classified, subsequent encounter: Secondary | ICD-10-CM

## 2017-11-13 DIAGNOSIS — Z833 Family history of diabetes mellitus: Secondary | ICD-10-CM | POA: Diagnosis not present

## 2017-11-13 DIAGNOSIS — E1122 Type 2 diabetes mellitus with diabetic chronic kidney disease: Secondary | ICD-10-CM | POA: Diagnosis not present

## 2017-11-13 DIAGNOSIS — I252 Old myocardial infarction: Secondary | ICD-10-CM | POA: Insufficient documentation

## 2017-11-13 DIAGNOSIS — Z992 Dependence on renal dialysis: Secondary | ICD-10-CM | POA: Diagnosis not present

## 2017-11-13 DIAGNOSIS — Z09 Encounter for follow-up examination after completed treatment for conditions other than malignant neoplasm: Secondary | ICD-10-CM | POA: Insufficient documentation

## 2017-11-13 DIAGNOSIS — Z794 Long term (current) use of insulin: Secondary | ICD-10-CM | POA: Insufficient documentation

## 2017-11-13 DIAGNOSIS — Z9889 Other specified postprocedural states: Secondary | ICD-10-CM | POA: Diagnosis not present

## 2017-11-13 DIAGNOSIS — R269 Unspecified abnormalities of gait and mobility: Secondary | ICD-10-CM | POA: Diagnosis not present

## 2017-11-13 DIAGNOSIS — R0989 Other specified symptoms and signs involving the circulatory and respiratory systems: Secondary | ICD-10-CM | POA: Diagnosis not present

## 2017-11-13 DIAGNOSIS — Z8249 Family history of ischemic heart disease and other diseases of the circulatory system: Secondary | ICD-10-CM | POA: Diagnosis not present

## 2017-11-13 DIAGNOSIS — Z89511 Acquired absence of right leg below knee: Secondary | ICD-10-CM

## 2017-11-13 DIAGNOSIS — Z8673 Personal history of transient ischemic attack (TIA), and cerebral infarction without residual deficits: Secondary | ICD-10-CM | POA: Diagnosis not present

## 2017-11-13 DIAGNOSIS — R6889 Other general symptoms and signs: Secondary | ICD-10-CM | POA: Insufficient documentation

## 2017-11-13 DIAGNOSIS — N186 End stage renal disease: Secondary | ICD-10-CM | POA: Diagnosis not present

## 2017-11-13 NOTE — Progress Notes (Addendum)
Subjective:    Patient ID: Charles Vasquez, male    DOB: 1935/10/30, 82 y.o.   MRN: [id number]  HPI 82 year old right-handed male with history of hypertension, CAD, with end-stage renal disease, hemodialysis, as well as diabetes mellitus presents for follow up for amputations.   Last clinic visit 10/16/17.  Family present and provides history. Since that time, pt is following with ortho for wound care and has not seen Wound care. He is still at the SNF. BP is controlled. Denies falls.   Pain Inventory Average Pain 0 Pain Right Now 0 My pain is none  In the last 24 hours, has pain interfered with the following? General activity 2 Relation with others 10 Enjoyment of life 10 What TIME of day is your pain at its worst? morning Sleep (in general) Good Pain is worse with: unsure Pain improves with: medication Relief from Meds: 5  Mobility ability to climb steps?  no do you drive?  no use a wheelchair needs help with transfers  Function retired I need assistance with the following:  bathing, meal prep and household duties  Neuro/Psych tingling trouble walking  Prior Studies Any changes since last visit?  no  Physicians involved in your care Any changes since last visit?  no   Family History  Problem Relation Age of Onset  . Diabetes Mother   . Hypertension Mother   . AAA (abdominal aortic aneurysm) Mother   . Diabetes Father   . Hypertension Father    Social History   Socioeconomic History  . Marital status: Widowed    Spouse name: Not on file  . Number of children: Not on file  . Years of education: Not on file  . Highest education level: Not on file  Occupational History  . Not on file  Social Needs  . Financial resource strain: Not very hard  . Food insecurity:    Worry: Never true    Inability: Never true  . Transportation needs:    Medical: No    Non-medical: No  Tobacco Use  . Smoking status: Never Smoker  . Smokeless tobacco: Never Used    Substance and Sexual Activity  . Alcohol use: No  . Drug use: No  . Sexual activity: Yes  Lifestyle  . Physical activity:    Days per week: 2 days    Minutes per session: 20 min  . Stress: Only a little  Relationships  . Social connections:    Talks on phone: Once a week    Gets together: Once a week    Attends religious service: 1 to 4 times per year    Active member of club or organization: Yes    Attends meetings of clubs or organizations: 1 to 4 times per year    Relationship status: Widowed  Other Topics Concern  . Not on file  Social History Narrative  . Not on file   Past Surgical History:  Procedure Laterality Date  . ABDOMINAL AORTOGRAM W/LOWER EXTREMITY N/A 06/25/2017   Procedure: ABDOMINAL AORTOGRAM W/LOWER EXTREMITY;  Surgeon: Maeola Harman, MD;  Location: [facility] INVASIVE CV LAB;  Service: Cardiovascular;  Laterality: N/A;  . AMPUTATION Right 01/01/2017   Procedure: REVISION AMPUTATION RIGHT INDEX FINGER;  Surgeon: Dairl Ponder, MD;  Location: [facility] OR;  Service: Orthopedics;  Laterality: Right;  . AMPUTATION Right 07/15/2017   Procedure: AMPUTATION BELOW KNEE RIGHT;  Surgeon: [name], MD;  Location: [facility] OR;  Service: Vascular;  Laterality: Right;  . AMPUTATION  Right 10/08/2017   Procedure: RIGHT INDEX, RIGHT LONG REVISION AMPUTATIONS;  Surgeon: Dairl Ponder, MD;  Location: [facility] OR;  Service: Orthopedics;  Laterality: Right;  . ARTERIOVENOUS GRAFT PLACEMENT    . ARTERIOVENOUS GRAFT PLACEMENT Left 10/22/2016   thigh  . AV FISTULA PLACEMENT  03/31/2012   Procedure: ARTERIOVENOUS (AV) FISTULA CREATION;  Surgeon: [name], MD;  Location: [facility] OR;  Service: Vascular;  Laterality: Right;  First stage Brachial vein transposition  . AV FISTULA PLACEMENT Right 08/25/2012   Procedure: INSERTION OF ARTERIOVENOUS (AV) GORE-TEX GRAFT ARM;  Surgeon: [name], MD;  Location: [facility] OR;  Service: Vascular;  Laterality: Right;  . AV FISTULA PLACEMENT Left 07/23/2016    Procedure: INSERTION OF ARTERIOVENOUS (AV) GORE-TEX GRAFT LEFT UPPER ARM;  Surgeon: [name], MD;  Location: [facility] OR;  Service: Vascular;  Laterality: Left;  . AV FISTULA PLACEMENT Left 10/22/2016   Procedure: INSERTION OF 4-29mm x 45cm  ARTERIOVENOUS (AV) GORE-TEX GRAFT THIGH-LEFT;  Surgeon: [name], MD;  Location: [facility] OR;  Service: Vascular;  Laterality: Left;  . CATARACT EXTRACTION W/ INTRAOCULAR LENS  IMPLANT, BILATERAL    . COLONOSCOPY N/A 03/25/2017   Dr. Darrick Penna: examined portion of ileum normal. Redundant left colon, stricture at anus   . ESOPHAGOGASTRODUODENOSCOPY N/A 03/25/2017   Dr. Darrick Penna: widely patent Schatzki ring at GE junction s/p dilation, atrophic gastritis, single duodenal AVM, non-bleeding  . INSERTION OF DIALYSIS CATHETER Left 05/05/2012   Procedure: INSERTION OF DIALYSIS CATHETER;  Surgeon: Chuck Hint, MD;  Location: [facility] OR;  Service: Vascular;  Laterality: Left;  . INSERTION OF DIALYSIS CATHETER Right 10/01/2016   Procedure: INSERTION OF DIALYSIS CATHETER- RIGHT FEMORAL;  Surgeon: [name], MD;  Location: [facility] OR;  Service: Vascular;  Laterality: Right;  . IR FLUORO GUIDE CV LINE RIGHT  06/10/2016  . IR GENERIC HISTORICAL  06/07/2016   IR US GUIDE VASC ACCESS RIGHT 06/07/2016 Oley Balm, MD [facility]-INTERV RAD  . IR THROMBECTOMY AV FISTULA W/THROMBOLYSIS/PTA INC/SHUNT/IMG RIGHT Right 06/07/2016  . IR US GUIDE VASC ACCESS RIGHT  06/10/2016  . LIGATION ARTERIOVENOUS GORTEX GRAFT Left 08/04/2016   Procedure: LIGATION ARTERIOVENOUS GORTEX GRAFT;  Surgeon: Larina Earthly, MD;  Location: [facility] OR;  Service: Vascular;  Laterality: Left;  . LIGATION ARTERIOVENOUS GORTEX GRAFT Right 11/26/2016   Procedure: LIGATION OF RIGHT UPPER ARM ARTERIOVENOUS GORTEX GRAFT;  Surgeon: [name], MD;  Location: [facility] OR;  Service: Vascular;  Laterality: Right;  . LIGATION OF ARTERIOVENOUS  FISTULA Right 08/25/2012   Procedure: LIGATION OF ARTERIOVENOUS  FISTULA;  Surgeon: [name], MD;   Location: [facility] OR;  Service: Vascular;  Laterality: Right;  Ligation of right brachial vein transposition  . PERIPHERAL VASCULAR ATHERECTOMY  06/25/2017   Procedure: PERIPHERAL VASCULAR ATHERECTOMY;  Surgeon: Maeola Harman, MD;  Location: [facility] INVASIVE CV LAB;  Service: Cardiovascular;;  Rt. PT  . REMOVAL OF A DIALYSIS CATHETER Right 05/05/2012   Procedure: REMOVAL OF A DIALYSIS CATHETER;  Surgeon: Chuck Hint, MD;  Location: [facility] OR;  Service: Vascular;  Laterality: Right;  . REMOVAL OF A DIALYSIS CATHETER Right 10/01/2016   Procedure: REMOVAL OF A DIALYSIS CATHETER-RIGHT UPPER CHEST;  Surgeon: [name], MD;  Location: [facility] OR;  Service: Vascular;  Laterality: Right;  . REMOVAL OF A DIALYSIS CATHETER Right 11/26/2016   Procedure: REMOVAL OF RIGHT FEMORAL TUNNEL DIALYSIS CATHETER;  Surgeon: [name], MD;  Location: [facility] OR;  Service: Vascular;  Laterality: Right;  .  SPHINCTEROTOMY N/A 05/15/2017   Procedure: POSSIBLE SPHINCTEROTOMY (BOTOX);  Surgeon: Romie Levee, MD;  Location: [facility];  Service: General;  Laterality: N/A;  . TOE AMPUTATION  02/2007   left foot first 3 toes  . UPPER EXTREMITY VENOGRAPHY Bilateral 07/18/2016   Procedure: Bilateral Upper Extremity Venography;  Surgeon: [name], MD;  Location: [facility] INVASIVE CV LAB;  Service: Cardiovascular;  Laterality: Bilateral;   Past Medical History:  Diagnosis Date  . Anal pain   . AVF (arteriovenous fistula) (HCC) 05-13-2017 per pt currently AVF access used is left thigh   hx multiple AVF surgery's (previously left upper arm, bilateral thigh) last surgery -- right upper arm creation 11-26-2016  . ESRD (end stage renal disease) on dialysis [facility])    [facility] in [place], [place] on MWF   . History of CVA (cerebrovascular accident)    05-13-2017  per pt stated "thats what they told yrs ago"  per pt no residual  . Hyperlipidemia   . Hypertension   . Myocardial infarction (HCC)   . Stroke (HCC)    . Type 2 diabetes mellitus treated with insulin (HCC)    Type II   BP (!) 154/76 (BP Location: Left Arm, Patient Position: Sitting, Cuff Size: Small)   Pulse 76   Ht 5\' 7"  (1.702 m)   Wt 126 lb (57.2 kg)   SpO2 98%   BMI 19.73 kg/m   Opioid Risk Score:   Fall Risk Score:  `1  Depression screen PHQ 2/9  Depression screen [facility] 2/9 11/13/2017 06/17/2017  Decreased Interest 0 0  Down, Depressed, Hopeless 0 0  PHQ - 2 Score 0 0    Review of Systems  Constitutional: Positive for unexpected weight change.  HENT: Negative.   Eyes: Negative.   Respiratory: Positive for cough.   Cardiovascular: Negative.   Gastrointestinal: Positive for nausea and vomiting.  Endocrine: Negative.   Musculoskeletal: Positive for arthralgias, gait problem and myalgias.  Skin: Negative.   Allergic/Immunologic: Negative.   Neurological: Positive for numbness.  Hematological: Negative.   Psychiatric/Behavioral: Negative.   All other systems reviewed and are negative.      Objective:   Physical Exam Constitutional: No distress . Vital signs reviewed. HENT: Normocephalic.  Atraumatic. Eyes: EOMI. No discharge. Cardiovascular: RRR. No JVD. Respiratory: CTA bilaterally. Normal effort. GI: BS+. Non-distended. MSK: Mild TTP in stump Right BKA tenderness, no edema Neurology: Alert HOH RUE: amputation of 2nd and 3rd digits at PIP LLE: amputation digits  Neurological: He is alert, poor historial Motor: B/l UE, LLE, Right hip flexion 4+-5/5 proximal to distal  Skin: Right finger with dressing c/d/i BKA with dressing and drainage Left heal with ulcer    Assessment & Plan:  82 year old right-handed male with history of hypertension, CAD, with end-stage renal disease, hemodialysis, as well as diabetes mellitus presents for follow up for amputations.   1. 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   Wound being managed by Ortho  Currently at SNF  Pt not going to wound care and being managed by Ortho.  As they are managing the wound and BKA, will follow up with pt PRN  2.  Hypertension.    Labile at present per pt  Cont meds  Recs  per Nephro  3. Gait abnormality  Cont HEP  Cont wheelchair for safety  >25 minutes spent with patient and family, with >20 minutes spent discussing wound care, therapies

## 2017-11-14 DIAGNOSIS — Z23 Encounter for immunization: Secondary | ICD-10-CM | POA: Diagnosis not present

## 2017-11-14 DIAGNOSIS — N186 End stage renal disease: Secondary | ICD-10-CM | POA: Diagnosis not present

## 2017-11-14 DIAGNOSIS — Z992 Dependence on renal dialysis: Secondary | ICD-10-CM | POA: Diagnosis not present

## 2017-11-15 DIAGNOSIS — N186 End stage renal disease: Secondary | ICD-10-CM | POA: Diagnosis not present

## 2017-11-15 DIAGNOSIS — I10 Essential (primary) hypertension: Secondary | ICD-10-CM | POA: Diagnosis not present

## 2017-11-15 DIAGNOSIS — D631 Anemia in chronic kidney disease: Secondary | ICD-10-CM | POA: Diagnosis not present

## 2017-11-15 DIAGNOSIS — Z89511 Acquired absence of right leg below knee: Secondary | ICD-10-CM | POA: Diagnosis not present

## 2017-11-15 DIAGNOSIS — E119 Type 2 diabetes mellitus without complications: Secondary | ICD-10-CM | POA: Diagnosis not present

## 2017-11-17 DIAGNOSIS — Z992 Dependence on renal dialysis: Secondary | ICD-10-CM | POA: Diagnosis not present

## 2017-11-17 DIAGNOSIS — Z23 Encounter for immunization: Secondary | ICD-10-CM | POA: Diagnosis not present

## 2017-11-17 DIAGNOSIS — N186 End stage renal disease: Secondary | ICD-10-CM | POA: Diagnosis not present

## 2017-11-19 DIAGNOSIS — N186 End stage renal disease: Secondary | ICD-10-CM | POA: Diagnosis not present

## 2017-11-19 DIAGNOSIS — Z992 Dependence on renal dialysis: Secondary | ICD-10-CM | POA: Diagnosis not present

## 2017-11-19 DIAGNOSIS — Z23 Encounter for immunization: Secondary | ICD-10-CM | POA: Diagnosis not present

## 2017-11-21 DIAGNOSIS — Z992 Dependence on renal dialysis: Secondary | ICD-10-CM | POA: Diagnosis not present

## 2017-11-21 DIAGNOSIS — N186 End stage renal disease: Secondary | ICD-10-CM | POA: Diagnosis not present

## 2017-11-21 DIAGNOSIS — Z23 Encounter for immunization: Secondary | ICD-10-CM | POA: Diagnosis not present

## 2017-11-24 DIAGNOSIS — N186 End stage renal disease: Secondary | ICD-10-CM | POA: Diagnosis not present

## 2017-11-24 DIAGNOSIS — Z992 Dependence on renal dialysis: Secondary | ICD-10-CM | POA: Diagnosis not present

## 2017-11-24 DIAGNOSIS — Z23 Encounter for immunization: Secondary | ICD-10-CM | POA: Diagnosis not present

## 2017-11-26 DIAGNOSIS — N186 End stage renal disease: Secondary | ICD-10-CM | POA: Diagnosis not present

## 2017-11-26 DIAGNOSIS — Z992 Dependence on renal dialysis: Secondary | ICD-10-CM | POA: Diagnosis not present

## 2017-11-26 DIAGNOSIS — Z23 Encounter for immunization: Secondary | ICD-10-CM | POA: Diagnosis not present

## 2017-11-28 DIAGNOSIS — Z992 Dependence on renal dialysis: Secondary | ICD-10-CM | POA: Diagnosis not present

## 2017-11-28 DIAGNOSIS — N186 End stage renal disease: Secondary | ICD-10-CM | POA: Diagnosis not present

## 2017-11-28 DIAGNOSIS — Z23 Encounter for immunization: Secondary | ICD-10-CM | POA: Diagnosis not present

## 2017-12-01 DIAGNOSIS — N186 End stage renal disease: Secondary | ICD-10-CM | POA: Diagnosis not present

## 2017-12-01 DIAGNOSIS — Z992 Dependence on renal dialysis: Secondary | ICD-10-CM | POA: Diagnosis not present

## 2017-12-01 DIAGNOSIS — Z23 Encounter for immunization: Secondary | ICD-10-CM | POA: Diagnosis not present

## 2017-12-01 DIAGNOSIS — Z794 Long term (current) use of insulin: Secondary | ICD-10-CM | POA: Diagnosis not present

## 2017-12-01 DIAGNOSIS — E119 Type 2 diabetes mellitus without complications: Secondary | ICD-10-CM | POA: Diagnosis not present

## 2017-12-02 ENCOUNTER — Ambulatory Visit: Payer: Medicare Other

## 2017-12-02 ENCOUNTER — Encounter (HOSPITAL_BASED_OUTPATIENT_CLINIC_OR_DEPARTMENT_OTHER): Payer: Medicare Other | Attending: Internal Medicine

## 2017-12-02 DIAGNOSIS — E1140 Type 2 diabetes mellitus with diabetic neuropathy, unspecified: Secondary | ICD-10-CM | POA: Diagnosis not present

## 2017-12-02 DIAGNOSIS — E11621 Type 2 diabetes mellitus with foot ulcer: Secondary | ICD-10-CM | POA: Insufficient documentation

## 2017-12-02 DIAGNOSIS — L97429 Non-pressure chronic ulcer of left heel and midfoot with unspecified severity: Secondary | ICD-10-CM | POA: Diagnosis not present

## 2017-12-02 DIAGNOSIS — Z89511 Acquired absence of right leg below knee: Secondary | ICD-10-CM | POA: Insufficient documentation

## 2017-12-02 DIAGNOSIS — Z794 Long term (current) use of insulin: Secondary | ICD-10-CM | POA: Diagnosis not present

## 2017-12-02 DIAGNOSIS — N186 End stage renal disease: Secondary | ICD-10-CM | POA: Insufficient documentation

## 2017-12-02 DIAGNOSIS — L97526 Non-pressure chronic ulcer of other part of left foot with bone involvement without evidence of necrosis: Secondary | ICD-10-CM | POA: Diagnosis not present

## 2017-12-02 DIAGNOSIS — T8789 Other complications of amputation stump: Secondary | ICD-10-CM | POA: Diagnosis not present

## 2017-12-02 DIAGNOSIS — E1122 Type 2 diabetes mellitus with diabetic chronic kidney disease: Secondary | ICD-10-CM | POA: Insufficient documentation

## 2017-12-02 DIAGNOSIS — S81801A Unspecified open wound, right lower leg, initial encounter: Secondary | ICD-10-CM | POA: Diagnosis not present

## 2017-12-02 DIAGNOSIS — I96 Gangrene, not elsewhere classified: Secondary | ICD-10-CM | POA: Diagnosis not present

## 2017-12-02 DIAGNOSIS — I120 Hypertensive chronic kidney disease with stage 5 chronic kidney disease or end stage renal disease: Secondary | ICD-10-CM | POA: Diagnosis not present

## 2017-12-02 DIAGNOSIS — L97812 Non-pressure chronic ulcer of other part of right lower leg with fat layer exposed: Secondary | ICD-10-CM | POA: Diagnosis not present

## 2017-12-02 DIAGNOSIS — S91302A Unspecified open wound, left foot, initial encounter: Secondary | ICD-10-CM | POA: Diagnosis not present

## 2017-12-02 DIAGNOSIS — E1152 Type 2 diabetes mellitus with diabetic peripheral angiopathy with gangrene: Secondary | ICD-10-CM | POA: Insufficient documentation

## 2017-12-02 DIAGNOSIS — Z89021 Acquired absence of right finger(s): Secondary | ICD-10-CM | POA: Insufficient documentation

## 2017-12-02 DIAGNOSIS — S91105A Unspecified open wound of left lesser toe(s) without damage to nail, initial encounter: Secondary | ICD-10-CM | POA: Diagnosis not present

## 2017-12-03 DIAGNOSIS — Z23 Encounter for immunization: Secondary | ICD-10-CM | POA: Diagnosis not present

## 2017-12-03 DIAGNOSIS — N186 End stage renal disease: Secondary | ICD-10-CM | POA: Diagnosis not present

## 2017-12-03 DIAGNOSIS — Z992 Dependence on renal dialysis: Secondary | ICD-10-CM | POA: Diagnosis not present

## 2017-12-04 ENCOUNTER — Ambulatory Visit (INDEPENDENT_AMBULATORY_CARE_PROVIDER_SITE_OTHER): Payer: Medicare Other | Admitting: Orthopedic Surgery

## 2017-12-04 DIAGNOSIS — M79672 Pain in left foot: Secondary | ICD-10-CM | POA: Diagnosis not present

## 2017-12-05 DIAGNOSIS — Z992 Dependence on renal dialysis: Secondary | ICD-10-CM | POA: Diagnosis not present

## 2017-12-05 DIAGNOSIS — Z23 Encounter for immunization: Secondary | ICD-10-CM | POA: Diagnosis not present

## 2017-12-05 DIAGNOSIS — N186 End stage renal disease: Secondary | ICD-10-CM | POA: Diagnosis not present

## 2017-12-08 DIAGNOSIS — Z23 Encounter for immunization: Secondary | ICD-10-CM | POA: Diagnosis not present

## 2017-12-08 DIAGNOSIS — N186 End stage renal disease: Secondary | ICD-10-CM | POA: Diagnosis not present

## 2017-12-08 DIAGNOSIS — Z992 Dependence on renal dialysis: Secondary | ICD-10-CM | POA: Diagnosis not present

## 2017-12-10 DIAGNOSIS — N186 End stage renal disease: Secondary | ICD-10-CM | POA: Diagnosis not present

## 2017-12-10 DIAGNOSIS — Z992 Dependence on renal dialysis: Secondary | ICD-10-CM | POA: Diagnosis not present

## 2017-12-11 ENCOUNTER — Ambulatory Visit (INDEPENDENT_AMBULATORY_CARE_PROVIDER_SITE_OTHER): Payer: Medicare Other | Admitting: Physician Assistant

## 2017-12-11 ENCOUNTER — Other Ambulatory Visit: Payer: Self-pay

## 2017-12-11 VITALS — BP 143/68 | HR 81 | Temp 97.4°F | Resp 16 | Ht 67.0 in | Wt 130.0 lb

## 2017-12-11 DIAGNOSIS — S88111A Complete traumatic amputation at level between knee and ankle, right lower leg, initial encounter: Secondary | ICD-10-CM

## 2017-12-11 DIAGNOSIS — E11628 Type 2 diabetes mellitus with other skin complications: Secondary | ICD-10-CM

## 2017-12-11 DIAGNOSIS — L089 Local infection of the skin and subcutaneous tissue, unspecified: Secondary | ICD-10-CM

## 2017-12-11 NOTE — Progress Notes (Signed)
Established Critical Limb Ischemia Patient   History of Present Illness   Charles Vasquez is a 82 y.o. (Jun 26, 1935) male who presents for wound check of right BKA stump.  This was performed by [name] on 07/15/2017.  This wound has been slow to heal.  Patient also with other chronic wounds including right finger wounds followed by Dr. Mina Marble as well as left heel wound followed by orthopedics.  He resides in a rehab facility and lays in bed most of the day per family.  He has recently been referred to wound clinic where they have started silver collagen dressings on right BKA wound which is nearly healed during today's visit.  He denies any rest pain left foot.  He is mostly bothered by pain in his fingers of his right hand.  Past medical history also significant for insulin-dependent diabetes mellitus and ESRD on hemodialysis.  Arterial duplex several months ago demonstrated no hemodynamic medically significant stenosis of left lower extremity with multiple vessel runoff to left foot.  Current Outpatient Medications  Medication Sig Dispense Refill  . acetaminophen (TYLENOL) 650 MG CR tablet Take 650 mg by mouth every 8 (eight) hours as needed for pain.    . collagenase (SANTYL) ointment Apply 1 application topically daily. 15 g 0  . Darbepoetin Alfa (ARANESP) 200 MCG/0.4ML SOSY injection Inject 0.4 mLs (200 mcg total) into the vein every Monday with hemodialysis. 1.68 mL   . diclofenac sodium (VOLTAREN) 1 % GEL Apply 2 g topically 4 (four) times daily.    Marland Kitchen dicyclomine (BENTYL) 10 MG capsule 1 PO 30 MINUTES PRIOR TO BREAKFAST AND LUNCH (Patient taking differently: Take 10 mg by mouth 2 (two) times daily. ) 62 capsule 11  . doxercalciferol (HECTOROL) 4 MCG/2ML injection Inject 1 mL (2 mcg total) into the vein every Monday, Wednesday, and Friday with hemodialysis. 2 mL   . insulin glargine (LANTUS) 100 UNIT/ML injection Inject 0.08 mLs (8 Units total) into the skin every Monday, Wednesday, and  Friday at 8 PM. (Patient taking differently: Inject 8 Units into the skin every Monday, Wednesday, and Friday at 8 PM. ) 10 mL 11  . insulin lispro (HUMALOG) 100 UNIT/ML injection Inject 0-8 Units into the skin 3 (three) times daily before meals. Blood sugar of 70-100=0 units 101-250=1 unit 151-200=2 units 201-250=4 units  251-300=6 units 301-350= 8 units Greater than 350= 10 units **Call MD if glucose greater than 450**    . iron polysaccharides (NIFEREX) 150 MG capsule Take 1 capsule (150 mg total) by mouth 2 (two) times daily.    Marland Kitchen lidocaine (LIDODERM) 5 % Place 1 patch onto the skin daily. Remove & Discard patch within 12 hours or as directed by MD 3 patch 0  . losartan (COZAAR) 50 MG tablet Take 50 mg by mouth every evening.     . multivitamin (RENA-VIT) TABS tablet Take 1 tablet by mouth daily.  0  . pantoprazole (PROTONIX) 40 MG tablet Take 1 tablet (40 mg total) by mouth 2 (two) times daily with a meal.    . pentoxifylline (TRENTAL) 400 MG CR tablet Take 1 tablet (400 mg total) by mouth 3 (three) times daily with meals. 90 tablet 3  . sevelamer carbonate (RENVELA) 800 MG tablet Take 2 tablets (1,600 mg total) by mouth 3 (three) times daily with meals.    . traMADol (ULTRAM) 50 MG tablet Take 1 tablet (50 mg total) by mouth every 6 (six) hours as needed. 6 tablet 0  No current facility-administered medications for this visit.       Physical Examination   Vitals:   12/11/17 1252 12/11/17 1257  BP: (!) 144/67 (!) 143/68  Pulse: 81 81  Resp: 16   Temp: (!) 97.4 F (36.3 C)   TempSrc: Oral   Weight: 130 lb (59 kg)   Height: 5\' 7"  (1.702 m)    Body mass index is 20.36 kg/m.  General Alert, O x 3, WD, NAD  Pulmonary Sym exp, good B air movt,   Cardiac RRR, Nl S1, S2,   Vascular Vessel Right Left  Radial Palpable Palpable  Aorta Not palpable N/A  Popliteal Not palpable Not palpable  PT BKA biphasic by doppler  DP BKA biphasic by doppler    Gastro- intestinal soft,  non-distended, non-tender to palpation,   Musculo- skeletal  previous open area of right BKA stump now healed with small superficial dry eschar; small shallow noninfected wound over tibial tuberosity RLE; large necrotic area of left heel with no purulence or obvious infection; entire left foot dark in appearance; left fourth and fifth toes dry gangrene without purulence or infection  Neurologic A&O    Medical Decision Making   Charles Vasquez is a 82 y.o. male who presents with healing R BKA   Right BKA appears to be almost completely healed; continue current wound care over tibial tuberosity  Recommend avoiding prosthetic fitting until stump completely healed for several weeks given patient's difficulty with chronic wounds  Large necrotic area of left heel with dry gangrene left fourth and fifth toes and overall darkened appearance of left foot despite multiphasic DP and PT signals by Doppler  No indication for surgical intervention of left lower extremity at this time; patient does not want to consider amputation left lower extremity at this time  Continue current wound care with Betadine paint to toes of left foot  Continue follow-up with Dr. Mina Marble for right hand  Patient may follow-up as needed   [name] PA-C [facility] Office: [phone]

## 2017-12-11 NOTE — Progress Notes (Signed)
Vitals:   12/11/17 1252  BP: (!) 144/67  Pulse: 81  Resp: 16  Temp: (!) 97.4 F (36.3 C)  TempSrc: Oral  Weight: 130 lb (59 kg)  Height: 5\' 7"  (1.702 m)

## 2017-12-12 DIAGNOSIS — N186 End stage renal disease: Secondary | ICD-10-CM | POA: Diagnosis not present

## 2017-12-12 DIAGNOSIS — Z992 Dependence on renal dialysis: Secondary | ICD-10-CM | POA: Diagnosis not present

## 2017-12-15 DIAGNOSIS — Z992 Dependence on renal dialysis: Secondary | ICD-10-CM | POA: Diagnosis not present

## 2017-12-15 DIAGNOSIS — N186 End stage renal disease: Secondary | ICD-10-CM | POA: Diagnosis not present

## 2017-12-17 DIAGNOSIS — N186 End stage renal disease: Secondary | ICD-10-CM | POA: Diagnosis not present

## 2017-12-17 DIAGNOSIS — Z992 Dependence on renal dialysis: Secondary | ICD-10-CM | POA: Diagnosis not present

## 2017-12-18 ENCOUNTER — Ambulatory Visit (INDEPENDENT_AMBULATORY_CARE_PROVIDER_SITE_OTHER): Payer: Medicare Other | Admitting: Orthopedic Surgery

## 2017-12-18 ENCOUNTER — Encounter (HOSPITAL_BASED_OUTPATIENT_CLINIC_OR_DEPARTMENT_OTHER): Payer: Medicare Other | Attending: Internal Medicine

## 2017-12-18 DIAGNOSIS — E1152 Type 2 diabetes mellitus with diabetic peripheral angiopathy with gangrene: Secondary | ICD-10-CM | POA: Insufficient documentation

## 2017-12-18 DIAGNOSIS — L97812 Non-pressure chronic ulcer of other part of right lower leg with fat layer exposed: Secondary | ICD-10-CM | POA: Diagnosis not present

## 2017-12-18 DIAGNOSIS — Z89422 Acquired absence of other left toe(s): Secondary | ICD-10-CM | POA: Insufficient documentation

## 2017-12-18 DIAGNOSIS — S91302A Unspecified open wound, left foot, initial encounter: Secondary | ICD-10-CM | POA: Diagnosis not present

## 2017-12-18 DIAGNOSIS — N186 End stage renal disease: Secondary | ICD-10-CM | POA: Diagnosis not present

## 2017-12-18 DIAGNOSIS — E1151 Type 2 diabetes mellitus with diabetic peripheral angiopathy without gangrene: Secondary | ICD-10-CM | POA: Insufficient documentation

## 2017-12-18 DIAGNOSIS — I120 Hypertensive chronic kidney disease with stage 5 chronic kidney disease or end stage renal disease: Secondary | ICD-10-CM | POA: Diagnosis not present

## 2017-12-18 DIAGNOSIS — I96 Gangrene, not elsewhere classified: Secondary | ICD-10-CM | POA: Diagnosis not present

## 2017-12-18 DIAGNOSIS — E11621 Type 2 diabetes mellitus with foot ulcer: Secondary | ICD-10-CM | POA: Insufficient documentation

## 2017-12-18 DIAGNOSIS — E1122 Type 2 diabetes mellitus with diabetic chronic kidney disease: Secondary | ICD-10-CM | POA: Diagnosis not present

## 2017-12-18 DIAGNOSIS — E1140 Type 2 diabetes mellitus with diabetic neuropathy, unspecified: Secondary | ICD-10-CM | POA: Diagnosis not present

## 2017-12-18 DIAGNOSIS — E11622 Type 2 diabetes mellitus with other skin ulcer: Secondary | ICD-10-CM | POA: Diagnosis not present

## 2017-12-18 DIAGNOSIS — L97526 Non-pressure chronic ulcer of other part of left foot with bone involvement without evidence of necrosis: Secondary | ICD-10-CM | POA: Insufficient documentation

## 2017-12-18 DIAGNOSIS — L97422 Non-pressure chronic ulcer of left heel and midfoot with fat layer exposed: Secondary | ICD-10-CM | POA: Diagnosis not present

## 2017-12-18 DIAGNOSIS — Z89412 Acquired absence of left great toe: Secondary | ICD-10-CM | POA: Insufficient documentation

## 2017-12-18 DIAGNOSIS — T8789 Other complications of amputation stump: Secondary | ICD-10-CM | POA: Diagnosis not present

## 2017-12-18 DIAGNOSIS — S81801A Unspecified open wound, right lower leg, initial encounter: Secondary | ICD-10-CM | POA: Diagnosis not present

## 2017-12-18 DIAGNOSIS — S91105A Unspecified open wound of left lesser toe(s) without damage to nail, initial encounter: Secondary | ICD-10-CM | POA: Diagnosis not present

## 2017-12-19 DIAGNOSIS — Z992 Dependence on renal dialysis: Secondary | ICD-10-CM | POA: Diagnosis not present

## 2017-12-19 DIAGNOSIS — N186 End stage renal disease: Secondary | ICD-10-CM | POA: Diagnosis not present

## 2017-12-22 DIAGNOSIS — N186 End stage renal disease: Secondary | ICD-10-CM | POA: Diagnosis not present

## 2017-12-22 DIAGNOSIS — Z992 Dependence on renal dialysis: Secondary | ICD-10-CM | POA: Diagnosis not present

## 2017-12-23 ENCOUNTER — Encounter (INDEPENDENT_AMBULATORY_CARE_PROVIDER_SITE_OTHER): Payer: Self-pay | Admitting: Orthopedic Surgery

## 2017-12-23 ENCOUNTER — Ambulatory Visit (INDEPENDENT_AMBULATORY_CARE_PROVIDER_SITE_OTHER): Payer: Medicare Other | Admitting: Orthopedic Surgery

## 2017-12-23 DIAGNOSIS — E43 Unspecified severe protein-calorie malnutrition: Secondary | ICD-10-CM

## 2017-12-23 DIAGNOSIS — Z89511 Acquired absence of right leg below knee: Secondary | ICD-10-CM | POA: Diagnosis not present

## 2017-12-23 NOTE — Progress Notes (Signed)
Office Visit Note   Patient: Charles Vasquez           Date of Birth: 20-Dec-1935           MRN: [id number] Visit Date: 12/23/2017              Requested by: Toma Deiters, MD [address] [place], [place] [address] PCP: Toma Deiters, MD  Chief Complaint  Patient presents with  . Right Leg - Wound Check  . Left Foot - Wound Check, Follow-up      HPI: Patient presents for 2 separate issues #1 right transtibial amputation currently wearing a stump shrinker #2 progressive gangrenous changes to the left foot.  Patient states he has an odor from the left foot and has increasing necrotic changes of the fourth toe and little toe.  Patient has not made progress with physical therapy and physical therapy was discontinued at skilled nursing.  Assessment & Plan: Visit Diagnoses:  1. History of right below knee amputation (HCC)     Plan: I had a long discussion with the patient and his family.  Discussed that ideally he would be able to heal a above-knee amputation of the left and that should be the safest surgical option.  The patient's family would like to proceed with a below the knee amputation.  Discussed the patient has about a 50% chance of healing a below the knee amputation on the left.  Patient has an albumin of 1.9.  We will set up a below the knee amputation at their convenience he has dialysis on Monday Wednesday Friday discussed that we could do the surgery this Friday and proceed with dialysis on Saturday at [facility] he would need to stay at least 3 days with plan for discharge to skilled nursing.  Follow-Up Instructions: Return in about 2 weeks (around 01/06/2018).   Ortho Exam  Patient is alert, oriented, no adenopathy, well-dressed, normal affect, normal respiratory effort. Examination patient has increased necrotic changes of the left foot his entire heel pad is black and necrotic he has new necrotic ulcers beneath the fourth and fifth metatarsal head with necrotic toes 4  and 5 there is no ascending cellulitis his leg is thin and atrophic.  Examination of the right transtibial amputation has healed and patient is wearing his stump shrinker.  Imaging: No results found. No images are attached to the encounter.  Labs: Lab Results  Component Value Date   HGBA1C 6.6 (H) 10/08/2017   HGBA1C 7.1 (H) 12/24/2016   HGBA1C 8.8 (H) 08/10/2016   ESRSEDRATE 110 (H) 07/12/2017   CRP 21.4 (H) 07/12/2017   REPTSTATUS 07/17/2017 FINAL 07/12/2017   GRAMSTAIN  08/04/2016    RARE WBC PRESENT, PREDOMINANTLY PMN FEW GRAM NEGATIVE RODS FEW GRAM NEGATIVE DIPLOCOCCI RARE GRAM POSITIVE COCCI IN CLUSTERS    CULT  07/12/2017    NO GROWTH 5 DAYS Performed at [facility], [address], [place], [place] [address]    [name] PROTEUS SPECIES 07/12/2017   LABORGA STREPTOCOCCUS PARASANGUINIS 07/12/2017     Lab Results  Component Value Date   ALBUMIN 1.9 (L) 07/31/2017   ALBUMIN 1.9 (L) 07/28/2017   ALBUMIN 2.0 (L) 07/25/2017    There is no height or weight on file to calculate BMI.  Orders:  No orders of the defined types were placed in this encounter.  No orders of the defined types were placed in this encounter.    Procedures: No procedures performed  Clinical Data:  No additional findings.  ROS:  All other systems negative, except as noted in the HPI. Review of Systems  Objective: Vital Signs: There were no vitals taken for this visit.  Specialty Comments:  No specialty comments available.  PMFS History: Patient Active Problem List   Diagnosis Date Noted  . Osteomyelitis of finger of right hand (HCC) 09/24/2017  . Severe protein-calorie malnutrition (HCC) 09/16/2017  . 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 Labile blood pressure   . Chronic left shoulder pain   . Bacteremia   . Labile blood glucose   . Hypertensive crisis   . Hypoglycemia   . Leukocytosis   . Acute blood loss  anemia   . Anemia of chronic disease   . ESRD on dialysis (HCC)   . Type 2 diabetes mellitus with peripheral neuropathy (HCC)   . Benign essential HTN   . 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 Pressure injury of skin 07/13/2017  . Diabetic foot infection (HCC) 07/12/2017  . CAD (coronary artery disease) 07/12/2017  . Type II diabetes mellitus with renal manifestations (HCC) 07/12/2017  . Wound of right foot   . Loose stools 05/20/2017  . Abdominal pain   . Loss of weight 03/18/2017  . Abnormal CT scan, colon 03/18/2017  . Melena 03/18/2017  . Periumbilical abdominal pain 03/18/2017  . ESRD (end stage renal disease) on dialysis (HCC) 10/22/2016  . Venous hypertension status post AVG procedure 08/10/2016  . Anasarca 08/04/2016  . Facial swelling 08/04/2016  . Effusion, other site 08/04/2016  . 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 Acute respiratory failure (HCC) 08/04/2016  . Hyperkalemia 08/04/2016  . Aftercare following surgery of the circulatory system, NEC-Right  AVGG 09/25/2012  . Other complications due to renal dialysis device, implant, and graft 05/01/2012  . End stage renal disease (HCC) 03/17/2012  . FATIGUE, ACUTE 08/22/2008  . HEMATURIA UNSPECIFIED 07/11/2008  . Diabetes (HCC) 01/19/2008  . Essential hypertension 01/19/2008  . MYOCARDIAL INFARCTION, HX OF 01/19/2008  . GERD 01/19/2008  . OSTEOARTHRITIS 01/19/2008  . CEREBROVASCULAR ACCIDENT, HX OF 01/19/2008   Past Medical History:  Diagnosis Date  . Anal pain   . AVF (arteriovenous fistula) (HCC) 05-13-2017 per pt currently AVF access used is left thigh   hx multiple AVF surgery's (previously left upper arm, bilateral thigh) last surgery -- right upper arm creation 11-26-2016  . ESRD (end stage renal disease) on dialysis [facility])    [facility] in [place], [place] on MWF   . History of CVA (cerebrovascular accident)    05-13-2017  per pt stated "thats what they told yrs ago"   per pt no residual  . Hyperlipidemia   . Hypertension   . Myocardial infarction (HCC)   . Stroke (HCC)   . Type 2 diabetes mellitus treated with insulin (HCC)    Type II    Family History  Problem Relation Age of Onset  . Diabetes Mother   . Hypertension Mother   . AAA (abdominal aortic aneurysm) Mother   . Diabetes Father   . Hypertension Father     Past Surgical History:  Procedure Laterality Date  . ABDOMINAL AORTOGRAM W/LOWER EXTREMITY N/A 06/25/2017   Procedure: ABDOMINAL AORTOGRAM W/LOWER EXTREMITY;  Surgeon: Maeola Harman, MD;  Location: [facility] INVASIVE CV LAB;  Service: Cardiovascular;  Laterality: N/A;  . AMPUTATION Right 01/01/2017   Procedure: REVISION AMPUTATION RIGHT INDEX FINGER;  Surgeon: Dairl Ponder, MD;  Location: [facility] OR;  Service: Orthopedics;  Laterality: Right;  . AMPUTATION Right 07/15/2017   Procedure: AMPUTATION BELOW KNEE RIGHT;  Surgeon: [name], MD;  Location: [facility] OR;  Service: Vascular;  Laterality: Right;  . AMPUTATION Right 10/08/2017   Procedure: RIGHT INDEX, RIGHT LONG REVISION AMPUTATIONS;  Surgeon: Dairl Ponder, MD;  Location: [facility] OR;  Service: Orthopedics;  Laterality: Right;  . ARTERIOVENOUS GRAFT PLACEMENT    . ARTERIOVENOUS GRAFT PLACEMENT Left 10/22/2016   thigh  . AV FISTULA PLACEMENT  03/31/2012   Procedure: ARTERIOVENOUS (AV) FISTULA CREATION;  Surgeon: [name], MD;  Location: [facility] OR;  Service: Vascular;  Laterality: Right;  First stage Brachial vein transposition  . AV FISTULA PLACEMENT Right 08/25/2012   Procedure: INSERTION OF ARTERIOVENOUS (AV) GORE-TEX GRAFT ARM;  Surgeon: [name], MD;  Location: [facility] OR;  Service: Vascular;  Laterality: Right;  . AV FISTULA PLACEMENT Left 07/23/2016   Procedure: INSERTION OF ARTERIOVENOUS (AV) GORE-TEX GRAFT LEFT UPPER ARM;  Surgeon: [name], MD;  Location: [facility] OR;  Service: Vascular;  Laterality: Left;  . AV FISTULA PLACEMENT Left 10/22/2016   Procedure: INSERTION OF 4-60mm x  45cm  ARTERIOVENOUS (AV) GORE-TEX GRAFT THIGH-LEFT;  Surgeon: [name], MD;  Location: [facility] OR;  Service: Vascular;  Laterality: Left;  . CATARACT EXTRACTION W/ INTRAOCULAR LENS  IMPLANT, BILATERAL    . COLONOSCOPY N/A 03/25/2017   Dr. Darrick Penna: examined portion of ileum normal. Redundant left colon, stricture at anus   . ESOPHAGOGASTRODUODENOSCOPY N/A 03/25/2017   Dr. Darrick Penna: widely patent Schatzki ring at GE junction s/p dilation, atrophic gastritis, single duodenal AVM, non-bleeding  . INSERTION OF DIALYSIS CATHETER Left 05/05/2012   Procedure: INSERTION OF DIALYSIS CATHETER;  Surgeon: Chuck Hint, MD;  Location: [facility] OR;  Service: Vascular;  Laterality: Left;  . INSERTION OF DIALYSIS CATHETER Right 10/01/2016   Procedure: INSERTION OF DIALYSIS CATHETER- RIGHT FEMORAL;  Surgeon: [name], MD;  Location: [facility] OR;  Service: Vascular;  Laterality: Right;  . IR FLUORO GUIDE CV LINE RIGHT  06/10/2016  . IR GENERIC HISTORICAL  06/07/2016   IR US GUIDE VASC ACCESS RIGHT 06/07/2016 Oley Balm, MD [facility]-INTERV RAD  . IR THROMBECTOMY AV FISTULA W/THROMBOLYSIS/PTA INC/SHUNT/IMG RIGHT Right 06/07/2016  . IR US GUIDE VASC ACCESS RIGHT  06/10/2016  . LIGATION ARTERIOVENOUS GORTEX GRAFT Left 08/04/2016   Procedure: LIGATION ARTERIOVENOUS GORTEX GRAFT;  Surgeon: Larina Earthly, MD;  Location: [facility] OR;  Service: Vascular;  Laterality: Left;  . LIGATION ARTERIOVENOUS GORTEX GRAFT Right 11/26/2016   Procedure: LIGATION OF RIGHT UPPER ARM ARTERIOVENOUS GORTEX GRAFT;  Surgeon: [name], MD;  Location: [facility] OR;  Service: Vascular;  Laterality: Right;  . LIGATION OF ARTERIOVENOUS  FISTULA Right 08/25/2012   Procedure: LIGATION OF ARTERIOVENOUS  FISTULA;  Surgeon: [name], MD;  Location: [facility] OR;  Service: Vascular;  Laterality: Right;  Ligation of right brachial vein transposition  . PERIPHERAL VASCULAR ATHERECTOMY  06/25/2017   Procedure: PERIPHERAL VASCULAR ATHERECTOMY;  Surgeon: Maeola Harman, MD;   Location: [facility] INVASIVE CV LAB;  Service: Cardiovascular;;  Rt. PT  . REMOVAL OF A DIALYSIS CATHETER Right 05/05/2012   Procedure: REMOVAL OF A DIALYSIS CATHETER;  Surgeon: Chuck Hint, MD;  Location: [facility] OR;  Service: Vascular;  Laterality: Right;  . REMOVAL OF A DIALYSIS CATHETER Right 10/01/2016   Procedure: REMOVAL OF A DIALYSIS CATHETER-RIGHT UPPER CHEST;  Surgeon: [name], MD;  Location: [facility] OR;  Service:  Vascular;  Laterality: Right;  . REMOVAL OF A DIALYSIS CATHETER Right 11/26/2016   Procedure: REMOVAL OF RIGHT FEMORAL TUNNEL DIALYSIS CATHETER;  Surgeon: [name], MD;  Location: [facility] OR;  Service: Vascular;  Laterality: Right;  . SPHINCTEROTOMY N/A 05/15/2017   Procedure: POSSIBLE SPHINCTEROTOMY (BOTOX);  Surgeon: Romie Levee, MD;  Location: [facility];  Service: General;  Laterality: N/A;  . TOE AMPUTATION  02/2007   left foot first 3 toes  . UPPER EXTREMITY VENOGRAPHY Bilateral 07/18/2016   Procedure: Bilateral Upper Extremity Venography;  Surgeon: [name], MD;  Location: [facility] INVASIVE CV LAB;  Service: Cardiovascular;  Laterality: Bilateral;   Social History   Occupational History  . Not on file  Tobacco Use  . Smoking status: Never Smoker  . Smokeless tobacco: Never Used  Substance and Sexual Activity  . Alcohol use: No  . Drug use: No  . Sexual activity: Yes

## 2017-12-24 DIAGNOSIS — N186 End stage renal disease: Secondary | ICD-10-CM | POA: Diagnosis not present

## 2017-12-24 DIAGNOSIS — Z992 Dependence on renal dialysis: Secondary | ICD-10-CM | POA: Diagnosis not present

## 2017-12-25 DIAGNOSIS — Z89511 Acquired absence of right leg below knee: Secondary | ICD-10-CM | POA: Diagnosis not present

## 2017-12-25 DIAGNOSIS — E119 Type 2 diabetes mellitus without complications: Secondary | ICD-10-CM | POA: Diagnosis not present

## 2017-12-25 DIAGNOSIS — D631 Anemia in chronic kidney disease: Secondary | ICD-10-CM | POA: Diagnosis not present

## 2017-12-25 DIAGNOSIS — N186 End stage renal disease: Secondary | ICD-10-CM | POA: Diagnosis not present

## 2017-12-25 DIAGNOSIS — I10 Essential (primary) hypertension: Secondary | ICD-10-CM | POA: Diagnosis not present

## 2017-12-26 ENCOUNTER — Telehealth (INDEPENDENT_AMBULATORY_CARE_PROVIDER_SITE_OTHER): Payer: Self-pay | Admitting: Orthopedic Surgery

## 2017-12-26 DIAGNOSIS — Z992 Dependence on renal dialysis: Secondary | ICD-10-CM | POA: Diagnosis not present

## 2017-12-26 DIAGNOSIS — N186 End stage renal disease: Secondary | ICD-10-CM | POA: Diagnosis not present

## 2017-12-26 NOTE — Telephone Encounter (Signed)
Ms. Burgess, patient's sister, called to let Dr. Lajoyce Corners that Li has decided to go ahead with surgery.  He would like this set up for as soon as possible.  Can you please fill out a surgery sheet and I will call to schedule.

## 2017-12-29 DIAGNOSIS — Z992 Dependence on renal dialysis: Secondary | ICD-10-CM | POA: Diagnosis not present

## 2017-12-29 DIAGNOSIS — N186 End stage renal disease: Secondary | ICD-10-CM | POA: Diagnosis not present

## 2017-12-29 NOTE — Telephone Encounter (Signed)
Ok, surgery sheet completed

## 2017-12-30 ENCOUNTER — Ambulatory Visit (INDEPENDENT_AMBULATORY_CARE_PROVIDER_SITE_OTHER): Payer: Self-pay | Admitting: Physician Assistant

## 2017-12-30 DIAGNOSIS — D649 Anemia, unspecified: Secondary | ICD-10-CM | POA: Diagnosis not present

## 2017-12-31 ENCOUNTER — Encounter (HOSPITAL_COMMUNITY): Payer: Self-pay | Admitting: *Deleted

## 2017-12-31 DIAGNOSIS — N186 End stage renal disease: Secondary | ICD-10-CM | POA: Diagnosis not present

## 2017-12-31 DIAGNOSIS — Z992 Dependence on renal dialysis: Secondary | ICD-10-CM | POA: Diagnosis not present

## 2017-12-31 NOTE — Pre-Procedure Instructions (Signed)
WARWICK ROSELLO  12/31/2017     [facility] [address] [place] - [name] [place] [address] Phone: [phone] Fax: [phone]   Your procedure is scheduled on Friday, January 02, 2018  Report to [facility] Admitting at 7:15 A.M.  Call this number if you have problems the morning of surgery:  [phone]   Remember:  Do not eat or drink after midnight Thursday, January 01, 2018  Take these medicines the morning of surgery with A SIP OF WATER : pantoprazole (PROTONIX),   if needed: Pain medication, ipratropium-albuterol (DUONEB) for shortness of breath  Stop taking vitamins, fish oil and herbal medications. Do not take any NSAIDs ie: Ibuprofen, Advil, Naproxen (Aleve), Motrin, BC and Goody Powder; stop now.    How to Manage Your Diabetes Before and After Surgery  Why is it important to control my blood sugar before and after surgery? . Improving blood sugar levels before and after surgery helps healing and can limit problems. . A way of improving blood sugar control is eating a healthy diet by: o  Eating less sugar and carbohydrates o  Increasing activity/exercise o  Talking with your doctor about reaching your blood sugar goals . High blood sugars (greater than 180 mg/dL) can raise your risk of infections and slow your recovery, so you will need to focus on controlling your diabetes during the weeks before surgery. . Make sure that the doctor who takes care of your diabetes knows about your planned surgery including the date and location.  How do I manage my blood sugar before surgery? . Check your blood sugar at least 4 times a day, starting 2 days before surgery, to make sure that the level is not too high or low. o Check your blood sugar the morning of your surgery when you wake up and every 2 hours until you get to the Short Stay unit. . If your blood sugar is less than 70 mg/dL, you will need to treat for low blood sugar: o Do not  take insulin. o Treat a low blood sugar (less than 70 mg/dL) with  cup of clear juice (cranberry or apple), 4 glucose tablets, OR glucose gel. Recheck blood sugar in 15 minutes after treatment (to make sure it is greater than 70 mg/dL). If your blood sugar is not greater than 70 mg/dL on recheck, call [phone] o  for further instructions. . Report your blood sugar to the short stay nurse when you get to Short Stay.  . If you are admitted to the hospital after surgery: o Your blood sugar will be checked by the staff and you will probably be given insulin after surgery (instead of oral diabetes medicines) to make sure you have good blood sugar levels. o The goal for blood sugar control after surgery is 80-180 mg/dL.  WHAT DO I DO ABOUT MY DIABETES MEDICATION?Marland Kitchen  . The morning of surgery, if your CBG is greater than 220 mg/dL, you may take  of your sliding scale (correction) dose of insulin.  Reviewed and Endorsed by [facility] Patient Education Committee, August 2015  Do not wear jewelry, make-up or nail polish.  Do not wear lotions, powders, or perfumes, or deodorant.  Do not shave 48 hours prior to surgery.  Men may shave face and neck.  Do not bring valuables to the hospital.  [facility] is not responsible for any belongings or valuables.  Contacts, dentures or bridgework may not  be worn into surgery.  Leave your suitcase in the car.  After surgery it may be brought to your room. Patients discharged the day of surgery will not be allowed to drive home.  Please read over the following fact sheets that you were given.

## 2017-12-31 NOTE — Progress Notes (Signed)
Pre-op instructions were faxed to pt nurse, Erlinda Hong, LPN at [facility]. Nurse confirmed receipt of fax and verbalized understanding of all pre-op instructions. Please complete pt assessment on DOS. Nurse stated that pt hemoglobin was low (per dialysis center), pt has heme positive stool and is scheduled to follow up with GI. Nurse stated that she will contact surgeon's office in the am (after clarifying pt hemoglobin level ) to make surgeon aware. Pt chart forwarded to anesthesia for review.

## 2017-12-31 NOTE — Telephone Encounter (Signed)
Surgery is scheduled for Friday 01/02/18.  All surgery information given to patient's sister.

## 2018-01-01 ENCOUNTER — Ambulatory Visit (INDEPENDENT_AMBULATORY_CARE_PROVIDER_SITE_OTHER): Payer: Self-pay | Admitting: Physician Assistant

## 2018-01-01 MED ORDER — SODIUM CHLORIDE 0.9% IV SOLUTION: Freq: Once | INTRAVENOUS | Status: AC

## 2018-01-01 NOTE — Anesthesia Preprocedure Evaluation (Addendum)
Anesthesia Evaluation  Patient identified by MRN, date of birth, ID band Patient awake    Reviewed: Allergy & Precautions, NPO status , Patient's Chart, lab work & pertinent test results  Airway Mallampati: II  TM Distance: >3 FB Neck ROM: Full    Dental  (+) Dental Advisory Given, Edentulous Upper, Edentulous Lower   Pulmonary neg pulmonary ROS,    Pulmonary exam normal breath sounds clear to auscultation       Cardiovascular hypertension, Pt. on medications + CAD and + Past MI  Normal cardiovascular exam Rhythm:Regular Rate:Normal     Neuro/Psych  Neuromuscular disease CVA, No Residual Symptoms    GI/Hepatic Neg liver ROS, GERD  Medicated,  Endo/Other  diabetes, Insulin Dependent  Renal/GU ESRF and DialysisRenal disease (MWF Dialysis)K+ 4.7     Musculoskeletal  (+) Arthritis ,   Abdominal   Peds  Hematology  (+) Blood dyscrasia, anemia ,   Anesthesia Other Findings Day of surgery medications reviewed with the patient.  Reproductive/Obstetrics                           Anesthesia Physical Anesthesia Plan  ASA: III  Anesthesia Plan: Regional and MAC   Post-op Pain Management:    Induction: Intravenous  PONV Risk Score and Plan: 1 and Propofol infusion  Airway Management Planned: Natural Airway and Simple Face Mask  Additional Equipment:   Intra-op Plan:   Post-operative Plan:   Informed Consent: I have reviewed the patients History and Physical, chart, labs and discussed the procedure including the risks, benefits and alternatives for the proposed anesthesia with the patient or authorized representative who has indicated his/her understanding and acceptance.   Dental advisory given  Plan Discussed with: CRNA  Anesthesia Plan Comments:       Anesthesia Quick Evaluation

## 2018-01-02 ENCOUNTER — Encounter (HOSPITAL_COMMUNITY)
Admission: RE | Disposition: A | Discharge status: Skilled Nursing Facility | Payer: Self-pay | Source: Ambulatory Visit | Attending: Orthopedic Surgery

## 2018-01-02 ENCOUNTER — Inpatient Hospital Stay (HOSPITAL_COMMUNITY): Payer: Medicare Other | Admitting: Physician Assistant

## 2018-01-02 ENCOUNTER — Encounter (HOSPITAL_COMMUNITY): Payer: Self-pay | Admitting: Surgery

## 2018-01-02 ENCOUNTER — Inpatient Hospital Stay (HOSPITAL_COMMUNITY)
Admission: RE | Admit: 2018-01-02 | Discharge: 2018-01-08 | DRG: 239 | Disposition: A | Discharge status: Skilled Nursing Facility | Payer: Medicare Other | Source: Ambulatory Visit | Attending: Orthopedic Surgery | Admitting: Orthopedic Surgery

## 2018-01-02 ENCOUNTER — Other Ambulatory Visit: Payer: Self-pay

## 2018-01-02 DIAGNOSIS — N186 End stage renal disease: Secondary | ICD-10-CM | POA: Diagnosis present

## 2018-01-02 DIAGNOSIS — I96 Gangrene, not elsewhere classified: Secondary | ICD-10-CM

## 2018-01-02 DIAGNOSIS — I252 Old myocardial infarction: Secondary | ICD-10-CM

## 2018-01-02 DIAGNOSIS — Z9842 Cataract extraction status, left eye: Secondary | ICD-10-CM

## 2018-01-02 DIAGNOSIS — Z8249 Family history of ischemic heart disease and other diseases of the circulatory system: Secondary | ICD-10-CM

## 2018-01-02 DIAGNOSIS — Z9841 Cataract extraction status, right eye: Secondary | ICD-10-CM

## 2018-01-02 DIAGNOSIS — Z89512 Acquired absence of left leg below knee: Secondary | ICD-10-CM | POA: Diagnosis not present

## 2018-01-02 DIAGNOSIS — E1152 Type 2 diabetes mellitus with diabetic peripheral angiopathy with gangrene: Secondary | ICD-10-CM | POA: Diagnosis not present

## 2018-01-02 DIAGNOSIS — I120 Hypertensive chronic kidney disease with stage 5 chronic kidney disease or end stage renal disease: Secondary | ICD-10-CM | POA: Diagnosis present

## 2018-01-02 DIAGNOSIS — K219 Gastro-esophageal reflux disease without esophagitis: Secondary | ICD-10-CM | POA: Diagnosis not present

## 2018-01-02 DIAGNOSIS — E785 Hyperlipidemia, unspecified: Secondary | ICD-10-CM | POA: Diagnosis present

## 2018-01-02 DIAGNOSIS — Z992 Dependence on renal dialysis: Secondary | ICD-10-CM | POA: Diagnosis not present

## 2018-01-02 DIAGNOSIS — Z888 Allergy status to other drugs, medicaments and biological substances status: Secondary | ICD-10-CM | POA: Diagnosis not present

## 2018-01-02 DIAGNOSIS — Z794 Long term (current) use of insulin: Secondary | ICD-10-CM

## 2018-01-02 DIAGNOSIS — Z4781 Encounter for orthopedic aftercare following surgical amputation: Secondary | ICD-10-CM | POA: Diagnosis not present

## 2018-01-02 DIAGNOSIS — Z961 Presence of intraocular lens: Secondary | ICD-10-CM | POA: Diagnosis not present

## 2018-01-02 DIAGNOSIS — Z89021 Acquired absence of right finger(s): Secondary | ICD-10-CM | POA: Diagnosis not present

## 2018-01-02 DIAGNOSIS — Z89511 Acquired absence of right leg below knee: Secondary | ICD-10-CM | POA: Diagnosis not present

## 2018-01-02 DIAGNOSIS — N2581 Secondary hyperparathyroidism of renal origin: Secondary | ICD-10-CM | POA: Diagnosis not present

## 2018-01-02 DIAGNOSIS — I70262 Atherosclerosis of native arteries of extremities with gangrene, left leg: Secondary | ICD-10-CM | POA: Diagnosis not present

## 2018-01-02 DIAGNOSIS — Z8673 Personal history of transient ischemic attack (TIA), and cerebral infarction without residual deficits: Secondary | ICD-10-CM

## 2018-01-02 DIAGNOSIS — Z743 Need for continuous supervision: Secondary | ICD-10-CM | POA: Diagnosis not present

## 2018-01-02 DIAGNOSIS — D631 Anemia in chronic kidney disease: Secondary | ICD-10-CM | POA: Diagnosis present

## 2018-01-02 DIAGNOSIS — I2510 Atherosclerotic heart disease of native coronary artery without angina pectoris: Secondary | ICD-10-CM | POA: Diagnosis not present

## 2018-01-02 DIAGNOSIS — Z833 Family history of diabetes mellitus: Secondary | ICD-10-CM

## 2018-01-02 DIAGNOSIS — E1122 Type 2 diabetes mellitus with diabetic chronic kidney disease: Secondary | ICD-10-CM | POA: Diagnosis not present

## 2018-01-02 DIAGNOSIS — M6281 Muscle weakness (generalized): Secondary | ICD-10-CM | POA: Diagnosis not present

## 2018-01-02 DIAGNOSIS — R279 Unspecified lack of coordination: Secondary | ICD-10-CM | POA: Diagnosis not present

## 2018-01-02 DIAGNOSIS — L97529 Non-pressure chronic ulcer of other part of left foot with unspecified severity: Secondary | ICD-10-CM | POA: Diagnosis not present

## 2018-01-02 DIAGNOSIS — R531 Weakness: Secondary | ICD-10-CM | POA: Diagnosis not present

## 2018-01-02 HISTORY — DX: Gangrene, not elsewhere classified: I96

## 2018-01-02 HISTORY — PX: AMPUTATION: SHX166

## 2018-01-02 LAB — CBC
HCT: 27 % — ABNORMAL LOW (ref 39.0–52.0)
Hemoglobin: 7.4 g/dL — ABNORMAL LOW (ref 13.0–17.0)
MCH: 24.1 pg — ABNORMAL LOW (ref 26.0–34.0)
MCHC: 27.4 g/dL — ABNORMAL LOW (ref 30.0–36.0)
MCV: 87.9 fL (ref 80.0–100.0)
PLATELETS: 217 10*3/uL (ref 150–400)
RBC: 3.07 MIL/uL — ABNORMAL LOW (ref 4.22–5.81)
RDW: 18 % — AB (ref 11.5–15.5)
WBC: 8.7 10*3/uL (ref 4.0–10.5)
nRBC: 0 % (ref 0.0–0.2)

## 2018-01-02 LAB — GLUCOSE, CAPILLARY
GLUCOSE-CAPILLARY: 188 mg/dL — AB (ref 70–99)
Glucose-Capillary: 205 mg/dL — ABNORMAL HIGH (ref 70–99)
Glucose-Capillary: 235 mg/dL — ABNORMAL HIGH (ref 70–99)
Glucose-Capillary: 126 mg/dL — ABNORMAL HIGH (ref 70–99)
Glucose-Capillary: 102 mg/dL — ABNORMAL HIGH (ref 70–99)

## 2018-01-02 LAB — BASIC METABOLIC PANEL
Anion gap: 13 (ref 5–15)
BUN: 62 mg/dL — AB (ref 8–23)
CO2: 24 mmol/L (ref 22–32)
CREATININE: 6.86 mg/dL — AB (ref 0.61–1.24)
Calcium: 7.7 mg/dL — ABNORMAL LOW (ref 8.9–10.3)
Chloride: 96 mmol/L — ABNORMAL LOW (ref 98–111)
GFR calc Af Amer: 8 mL/min — ABNORMAL LOW (ref >60)
GFR calc non Af Amer: 7 mL/min — ABNORMAL LOW (ref >60)
Glucose, Bld: 218 mg/dL — ABNORMAL HIGH (ref 70–99)
POTASSIUM: 4.7 mmol/L (ref 3.5–5.1)
SODIUM: 133 mmol/L — AB (ref 135–145)

## 2018-01-02 LAB — PREPARE RBC (CROSSMATCH)

## 2018-01-02 SURGERY — AMPUTATION BELOW KNEE
Anesthesia: Monitor Anesthesia Care | Site: Leg Lower | Laterality: Left

## 2018-01-02 MED ORDER — FENTANYL CITRATE (PF) 250 MCG/5ML IJ SOLN
INTRAMUSCULAR | Status: AC
  Filled 2018-01-02: qty 5

## 2018-01-02 MED ORDER — INSULIN GLARGINE 100 UNIT/ML ~~LOC~~ SOLN
8.0000 [IU] | SUBCUTANEOUS | Status: DC
  Administered 2018-01-05: 8 [IU] via SUBCUTANEOUS
  Filled 2018-01-02 (×3): qty 0.08

## 2018-01-02 MED ORDER — ROPIVACAINE HCL 5 MG/ML IJ SOLN
INTRAMUSCULAR | Status: DC | PRN
  Administered 2018-01-02: 10 mL via PERINEURAL
  Administered 2018-01-02: 20 mL via PERINEURAL

## 2018-01-02 MED ORDER — ACETAMINOPHEN 325 MG PO TABS
325.0000 mg | ORAL_TABLET | Freq: Four times a day (QID) | ORAL | Status: DC | PRN
  Administered 2018-01-03 – 2018-01-04 (×2): 325 mg via ORAL
  Administered 2018-01-05: 650 mg via ORAL
  Filled 2018-01-02 (×2): qty 1
  Filled 2018-01-02: qty 2

## 2018-01-02 MED ORDER — INSULIN ASPART 100 UNIT/ML ~~LOC~~ SOLN
SUBCUTANEOUS | Status: AC
  Filled 2018-01-02: qty 1

## 2018-01-02 MED ORDER — INSULIN ASPART 100 UNIT/ML ~~LOC~~ SOLN
3.0000 [IU] | Freq: Three times a day (TID) | SUBCUTANEOUS | Status: DC
  Administered 2018-01-02 – 2018-01-06 (×7): 3 [IU] via SUBCUTANEOUS

## 2018-01-02 MED ORDER — IPRATROPIUM-ALBUTEROL 0.5-2.5 (3) MG/3ML IN SOLN: 3.0000 mL | Freq: Four times a day (QID) | RESPIRATORY_TRACT | Status: DC | PRN

## 2018-01-02 MED ORDER — NEPRO/CARBSTEADY PO LIQD
237.0000 mL | Freq: Two times a day (BID) | ORAL | Status: DC
  Administered 2018-01-02 – 2018-01-08 (×13): 237 mL via ORAL
  Filled 2018-01-02 (×13): qty 237

## 2018-01-02 MED ORDER — FENTANYL CITRATE (PF) 100 MCG/2ML IJ SOLN: 25.0000 ug | INTRAMUSCULAR | Status: DC | PRN

## 2018-01-02 MED ORDER — CHLORHEXIDINE GLUCONATE 4 % EX LIQD: 60.0000 mL | Freq: Once | CUTANEOUS | Status: DC

## 2018-01-02 MED ORDER — MORPHINE SULFATE (PF) 2 MG/ML IV SOLN
0.5000 mg | INTRAVENOUS | Status: DC | PRN
  Administered 2018-01-03: 0.5 mg via INTRAVENOUS
  Filled 2018-01-02: qty 1

## 2018-01-02 MED ORDER — INSULIN ASPART 100 UNIT/ML ~~LOC~~ SOLN
3.0000 [IU] | Freq: Once | SUBCUTANEOUS | Status: AC
  Administered 2018-01-02: 3 [IU] via SUBCUTANEOUS

## 2018-01-02 MED ORDER — BISACODYL 10 MG RE SUPP: 10.0000 mg | Freq: Every day | RECTAL | Status: DC | PRN

## 2018-01-02 MED ORDER — DARBEPOETIN ALFA 150 MCG/0.3ML IJ SOSY
150.0000 ug | PREFILLED_SYRINGE | INTRAMUSCULAR | Status: DC
  Administered 2018-01-03: 150 ug via INTRAVENOUS
  Filled 2018-01-02: qty 0.3

## 2018-01-02 MED ORDER — ASPIRIN EC 325 MG PO TBEC
325.0000 mg | DELAYED_RELEASE_TABLET | Freq: Every day | ORAL | Status: DC
  Administered 2018-01-02 – 2018-01-08 (×7): 325 mg via ORAL
  Filled 2018-01-02 (×7): qty 1

## 2018-01-02 MED ORDER — SODIUM CHLORIDE 0.9 % IV SOLN: INTRAVENOUS | Status: DC

## 2018-01-02 MED ORDER — INSULIN ASPART 100 UNIT/ML ~~LOC~~ SOLN
0.0000 [IU] | Freq: Three times a day (TID) | SUBCUTANEOUS | Status: DC
  Administered 2018-01-02: 1 [IU] via SUBCUTANEOUS
  Administered 2018-01-03: 5 [IU] via SUBCUTANEOUS
  Administered 2018-01-03: 2 [IU] via SUBCUTANEOUS
  Administered 2018-01-04: 5 [IU] via SUBCUTANEOUS
  Administered 2018-01-04: 3 [IU] via SUBCUTANEOUS
  Administered 2018-01-04 – 2018-01-05 (×3): 2 [IU] via SUBCUTANEOUS
  Administered 2018-01-06: 9 [IU] via SUBCUTANEOUS
  Administered 2018-01-06: 2 [IU] via SUBCUTANEOUS
  Administered 2018-01-07: 5 [IU] via SUBCUTANEOUS
  Administered 2018-01-07: 2 [IU] via SUBCUTANEOUS
  Administered 2018-01-08: 3 [IU] via SUBCUTANEOUS
  Administered 2018-01-08: 2 [IU] via SUBCUTANEOUS

## 2018-01-02 MED ORDER — ONDANSETRON HCL 4 MG/2ML IJ SOLN: 4.0000 mg | Freq: Once | INTRAMUSCULAR | Status: DC | PRN

## 2018-01-02 MED ORDER — DOCUSATE SODIUM 100 MG PO CAPS
100.0000 mg | ORAL_CAPSULE | Freq: Two times a day (BID) | ORAL | Status: DC
  Administered 2018-01-02 – 2018-01-07 (×6): 100 mg via ORAL
  Filled 2018-01-02 (×9): qty 1

## 2018-01-02 MED ORDER — ONDANSETRON HCL 4 MG/2ML IJ SOLN: 4.0000 mg | Freq: Four times a day (QID) | INTRAMUSCULAR | Status: DC | PRN

## 2018-01-02 MED ORDER — CEFAZOLIN SODIUM-DEXTROSE 2-4 GM/100ML-% IV SOLN
2.0000 g | INTRAVENOUS | Status: AC
  Administered 2018-01-02: 2 g via INTRAVENOUS
  Filled 2018-01-02: qty 100

## 2018-01-02 MED ORDER — RENA-VITE PO TABS
1.0000 | ORAL_TABLET | Freq: Every day | ORAL | Status: DC
  Administered 2018-01-02 – 2018-01-08 (×7): 1 via ORAL
  Filled 2018-01-02 (×7): qty 1

## 2018-01-02 MED ORDER — LOSARTAN POTASSIUM 50 MG PO TABS
50.0000 mg | ORAL_TABLET | Freq: Every day | ORAL | Status: DC
  Administered 2018-01-02 – 2018-01-08 (×6): 50 mg via ORAL
  Filled 2018-01-02 (×6): qty 1

## 2018-01-02 MED ORDER — METHOCARBAMOL 500 MG PO TABS
500.0000 mg | ORAL_TABLET | Freq: Four times a day (QID) | ORAL | Status: DC | PRN
  Administered 2018-01-02 – 2018-01-07 (×4): 500 mg via ORAL
  Filled 2018-01-02 (×5): qty 1

## 2018-01-02 MED ORDER — HYDROCODONE-ACETAMINOPHEN 5-325 MG PO TABS
1.0000 | ORAL_TABLET | ORAL | Status: DC | PRN
  Administered 2018-01-02 – 2018-01-03 (×2): 1 via ORAL
  Administered 2018-01-03: 2 via ORAL
  Administered 2018-01-03 – 2018-01-07 (×4): 1 via ORAL
  Filled 2018-01-02: qty 2
  Filled 2018-01-02 (×6): qty 1

## 2018-01-02 MED ORDER — ONDANSETRON HCL 4 MG PO TABS: 4.0000 mg | ORAL_TABLET | Freq: Four times a day (QID) | ORAL | Status: DC | PRN

## 2018-01-02 MED ORDER — MAGNESIUM CITRATE PO SOLN: 1.0000 | Freq: Once | ORAL | Status: DC | PRN

## 2018-01-02 MED ORDER — SODIUM CHLORIDE 0.9 % IV SOLN
INTRAVENOUS | Status: DC | PRN
  Administered 2018-01-02: 10:00:00 via INTRAVENOUS

## 2018-01-02 MED ORDER — POLYETHYLENE GLYCOL 3350 17 G PO PACK
17.0000 g | PACK | Freq: Every day | ORAL | Status: DC | PRN
  Administered 2018-01-03: 17 g via ORAL
  Filled 2018-01-02: qty 1

## 2018-01-02 MED ORDER — DOXERCALCIFEROL 4 MCG/2ML IV SOLN
2.0000 ug | INTRAVENOUS | Status: DC
  Administered 2018-01-07: 2 ug via INTRAVENOUS
  Filled 2018-01-02 (×2): qty 2

## 2018-01-02 MED ORDER — METHOCARBAMOL 1000 MG/10ML IJ SOLN
500.0000 mg | Freq: Four times a day (QID) | INTRAVENOUS | Status: DC | PRN
  Filled 2018-01-02: qty 5

## 2018-01-02 MED ORDER — PANTOPRAZOLE SODIUM 40 MG PO TBEC
40.0000 mg | DELAYED_RELEASE_TABLET | Freq: Two times a day (BID) | ORAL | Status: DC
  Administered 2018-01-02 – 2018-01-08 (×10): 40 mg via ORAL
  Filled 2018-01-02 (×10): qty 1

## 2018-01-02 MED ORDER — METOCLOPRAMIDE HCL 5 MG PO TABS: 5.0000 mg | ORAL_TABLET | Freq: Three times a day (TID) | ORAL | Status: DC | PRN

## 2018-01-02 MED ORDER — 0.9 % SODIUM CHLORIDE (POUR BTL) OPTIME
TOPICAL | Status: DC | PRN
  Administered 2018-01-02: 1000 mL

## 2018-01-02 MED ORDER — MIDAZOLAM HCL 2 MG/2ML IJ SOLN
INTRAMUSCULAR | Status: AC
  Filled 2018-01-02: qty 2

## 2018-01-02 MED ORDER — FENTANYL CITRATE (PF) 100 MCG/2ML IJ SOLN
INTRAMUSCULAR | Status: AC
  Administered 2018-01-02: 25 ug via INTRAVENOUS
  Filled 2018-01-02: qty 2

## 2018-01-02 MED ORDER — CHLORHEXIDINE GLUCONATE CLOTH 2 % EX PADS
6.0000 | MEDICATED_PAD | Freq: Every day | CUTANEOUS | Status: DC
  Administered 2018-01-04: 6 via TOPICAL

## 2018-01-02 MED ORDER — ONDANSETRON HCL 4 MG/2ML IJ SOLN
INTRAMUSCULAR | Status: AC
  Filled 2018-01-02: qty 2

## 2018-01-02 MED ORDER — FENTANYL CITRATE (PF) 100 MCG/2ML IJ SOLN
25.0000 ug | Freq: Once | INTRAMUSCULAR | Status: AC
  Administered 2018-01-02: 25 ug via INTRAVENOUS

## 2018-01-02 MED ORDER — PROPOFOL 500 MG/50ML IV EMUL
INTRAVENOUS | Status: DC | PRN
  Administered 2018-01-02: 50 ug/kg/min via INTRAVENOUS

## 2018-01-02 MED ORDER — SODIUM CHLORIDE 0.9% IV SOLUTION: Freq: Once | INTRAVENOUS | Status: DC

## 2018-01-02 MED ORDER — HYDROCODONE-ACETAMINOPHEN 7.5-325 MG PO TABS: 1.0000 | ORAL_TABLET | ORAL | Status: DC | PRN

## 2018-01-02 MED ORDER — METOCLOPRAMIDE HCL 5 MG/ML IJ SOLN: 5.0000 mg | Freq: Three times a day (TID) | INTRAMUSCULAR | Status: DC | PRN

## 2018-01-02 MED ORDER — CEFAZOLIN SODIUM-DEXTROSE 1-4 GM/50ML-% IV SOLN
1.0000 g | Freq: Two times a day (BID) | INTRAVENOUS | Status: AC
  Administered 2018-01-02: 1 g via INTRAVENOUS
  Filled 2018-01-02: qty 50

## 2018-01-02 MED ORDER — AMITRIPTYLINE HCL 25 MG PO TABS
25.0000 mg | ORAL_TABLET | Freq: Every day | ORAL | Status: DC
  Administered 2018-01-02 – 2018-01-07 (×6): 25 mg via ORAL
  Filled 2018-01-02 (×6): qty 1

## 2018-01-02 MED ORDER — SEVELAMER CARBONATE 800 MG PO TABS
1600.0000 mg | ORAL_TABLET | Freq: Three times a day (TID) | ORAL | Status: DC
  Administered 2018-01-02 – 2018-01-08 (×12): 1600 mg via ORAL
  Filled 2018-01-02 (×12): qty 2

## 2018-01-02 MED ORDER — ONDANSETRON HCL 4 MG/2ML IJ SOLN
INTRAMUSCULAR | Status: DC | PRN
  Administered 2018-01-02: 4 mg via INTRAVENOUS

## 2018-01-02 SURGICAL SUPPLY — 37 items
BENZOIN TINCTURE PRP APPL 2/3 (GAUZE/BANDAGES/DRESSINGS) ×12 IMPLANT
BLADE SAW RECIP 87.9 MT (BLADE) ×3 IMPLANT
BLADE SURG 21 STRL SS (BLADE) ×3 IMPLANT
BNDG COHESIVE 6X5 TAN STRL LF (GAUZE/BANDAGES/DRESSINGS) ×6 IMPLANT
BNDG GAUZE ELAST 4 BULKY (GAUZE/BANDAGES/DRESSINGS) ×6 IMPLANT
CANISTER WOUND CARE 500ML ATS (WOUND CARE) ×3 IMPLANT
COVER SURGICAL LIGHT HANDLE (MISCELLANEOUS) ×3 IMPLANT
COVER WAND RF STERILE (DRAPES) IMPLANT
DRAPE INCISE IOBAN 66X45 STRL (DRAPES) ×3 IMPLANT
DRAPE U-SHAPE 47X51 STRL (DRAPES) ×3 IMPLANT
DRESSING PREVENA PLUS CUSTOM (GAUZE/BANDAGES/DRESSINGS) ×1 IMPLANT
DRSG PREVENA PLUS CUSTOM (GAUZE/BANDAGES/DRESSINGS) ×3
DURAPREP 26ML APPLICATOR (WOUND CARE) ×3 IMPLANT
ELECT REM PT RETURN 9FT ADLT (ELECTROSURGICAL) ×3
ELECTRODE REM PT RTRN 9FT ADLT (ELECTROSURGICAL) ×1 IMPLANT
GLOVE BIOGEL PI IND STRL 9 (GLOVE) ×1 IMPLANT
GLOVE BIOGEL PI INDICATOR 9 (GLOVE) ×2
GLOVE SURG ORTHO 9.0 STRL STRW (GLOVE) ×3 IMPLANT
GOWN STRL REUS W/ TWL XL LVL3 (GOWN DISPOSABLE) ×2 IMPLANT
GOWN STRL REUS W/TWL XL LVL3 (GOWN DISPOSABLE) ×4
KIT BASIN OR (CUSTOM PROCEDURE TRAY) ×3 IMPLANT
KIT TURNOVER KIT B (KITS) ×3 IMPLANT
MANIFOLD NEPTUNE II (INSTRUMENTS) ×3 IMPLANT
NS IRRIG 1000ML POUR BTL (IV SOLUTION) ×3 IMPLANT
PACK ORTHO EXTREMITY (CUSTOM PROCEDURE TRAY) ×3 IMPLANT
PAD ARMBOARD 7.5X6 YLW CONV (MISCELLANEOUS) ×3 IMPLANT
PREVENA RESTOR ARTHOFORM 33X30 (CANNISTER) ×3 IMPLANT
SPONGE LAP 18X18 X RAY DECT (DISPOSABLE) ×3 IMPLANT
STAPLER VISISTAT 35W (STAPLE) ×3 IMPLANT
STOCKINETTE IMPERVIOUS LG (DRAPES) ×3 IMPLANT
SUT SILK 2 0 (SUTURE) ×2
SUT SILK 2-0 18XBRD TIE 12 (SUTURE) ×1 IMPLANT
SUT VIC AB 1 CTX 27 (SUTURE) ×6 IMPLANT
TOWEL OR 17X26 10 PK STRL BLUE (TOWEL DISPOSABLE) ×3 IMPLANT
TUBE CONNECTING 12'X1/4 (SUCTIONS) ×1
TUBE CONNECTING 12X1/4 (SUCTIONS) ×2 IMPLANT
YANKAUER SUCT BULB TIP NO VENT (SUCTIONS) ×3 IMPLANT

## 2018-01-02 NOTE — H&P (Signed)
Charles Vasquez is an 82 y.o. male.   Chief Complaint: Progressive painful necrotic changes left forefoot HPI: Patient presents for progressive gangrenous changes to the left foot.  Patient states he has an odor from the left foot and has increasing necrotic changes of the fourth toe and little toe.  Patient has not made progress with physical therapy and physical therapy was discontinued at skilled nursing.  Patient is status post right transtibial amputation.   Past Medical History:  Diagnosis Date  . Anal pain   . AVF (arteriovenous fistula) (HCC) 05-13-2017 per pt currently AVF access used is left thigh   hx multiple AVF surgery's (previously left upper arm, bilateral thigh) last surgery -- right upper arm creation 11-26-2016  . ESRD (end stage renal disease) on dialysis [facility])    [facility] in [place], [place] on MWF   . Gangrene (HCC)    left leg  . History of CVA (cerebrovascular accident)    05-13-2017  per pt stated "thats what they told yrs ago"  per pt no residual  . Hyperlipidemia   . Hypertension   . Myocardial infarction (HCC)   . Stroke (HCC)   . Type 2 diabetes mellitus treated with insulin (HCC)    Type II    Past Surgical History:  Procedure Laterality Date  . ABDOMINAL AORTOGRAM W/LOWER EXTREMITY N/A 06/25/2017   Procedure: ABDOMINAL AORTOGRAM W/LOWER EXTREMITY;  Surgeon: Maeola Harman, MD;  Location: [facility] INVASIVE CV LAB;  Service: Cardiovascular;  Laterality: N/A;  . AMPUTATION Right 01/01/2017   Procedure: REVISION AMPUTATION RIGHT INDEX FINGER;  Surgeon: Dairl Ponder, MD;  Location: [facility] OR;  Service: Orthopedics;  Laterality: Right;  . AMPUTATION Right 07/15/2017   Procedure: AMPUTATION BELOW KNEE RIGHT;  Surgeon: [name], MD;  Location: [facility] OR;  Service: Vascular;  Laterality: Right;  . AMPUTATION Right 10/08/2017   Procedure: RIGHT INDEX, RIGHT LONG REVISION AMPUTATIONS;  Surgeon: Dairl Ponder, MD;  Location: [facility] OR;  Service:  Orthopedics;  Laterality: Right;  . ARTERIOVENOUS GRAFT PLACEMENT    . ARTERIOVENOUS GRAFT PLACEMENT Left 10/22/2016   thigh  . AV FISTULA PLACEMENT  03/31/2012   Procedure: ARTERIOVENOUS (AV) FISTULA CREATION;  Surgeon: [name], MD;  Location: [facility] OR;  Service: Vascular;  Laterality: Right;  First stage Brachial vein transposition  . AV FISTULA PLACEMENT Right 08/25/2012   Procedure: INSERTION OF ARTERIOVENOUS (AV) GORE-TEX GRAFT ARM;  Surgeon: [name], MD;  Location: [facility] OR;  Service: Vascular;  Laterality: Right;  . AV FISTULA PLACEMENT Left 07/23/2016   Procedure: INSERTION OF ARTERIOVENOUS (AV) GORE-TEX GRAFT LEFT UPPER ARM;  Surgeon: [name], MD;  Location: [facility] OR;  Service: Vascular;  Laterality: Left;  . AV FISTULA PLACEMENT Left 10/22/2016   Procedure: INSERTION OF 4-19mm x 45cm  ARTERIOVENOUS (AV) GORE-TEX GRAFT THIGH-LEFT;  Surgeon: [name], MD;  Location: [facility] OR;  Service: Vascular;  Laterality: Left;  . CATARACT EXTRACTION W/ INTRAOCULAR LENS  IMPLANT, BILATERAL    . COLONOSCOPY N/A 03/25/2017   Dr. Darrick Penna: examined portion of ileum normal. Redundant left colon, stricture at anus   . ESOPHAGOGASTRODUODENOSCOPY N/A 03/25/2017   Dr. Darrick Penna: widely patent Schatzki ring at GE junction s/p dilation, atrophic gastritis, single duodenal AVM, non-bleeding  . INSERTION OF DIALYSIS CATHETER Left 05/05/2012   Procedure: INSERTION OF DIALYSIS CATHETER;  Surgeon: Chuck Hint, MD;  Location: [facility] OR;  Service: Vascular;  Laterality: Left;  . INSERTION OF DIALYSIS CATHETER Right 10/01/2016  Procedure: INSERTION OF DIALYSIS CATHETER- RIGHT FEMORAL;  Surgeon: [name], MD;  Location: [facility] OR;  Service: Vascular;  Laterality: Right;  . IR FLUORO GUIDE CV LINE RIGHT  06/10/2016  . IR GENERIC HISTORICAL  06/07/2016   IR US GUIDE VASC ACCESS RIGHT 06/07/2016 Oley Balm, MD [facility]-INTERV RAD  . IR THROMBECTOMY AV FISTULA W/THROMBOLYSIS/PTA INC/SHUNT/IMG RIGHT Right 06/07/2016  . IR US  GUIDE VASC ACCESS RIGHT  06/10/2016  . LIGATION ARTERIOVENOUS GORTEX GRAFT Left 08/04/2016   Procedure: LIGATION ARTERIOVENOUS GORTEX GRAFT;  Surgeon: Larina Earthly, MD;  Location: [facility] OR;  Service: Vascular;  Laterality: Left;  . LIGATION ARTERIOVENOUS GORTEX GRAFT Right 11/26/2016   Procedure: LIGATION OF RIGHT UPPER ARM ARTERIOVENOUS GORTEX GRAFT;  Surgeon: [name], MD;  Location: [facility] OR;  Service: Vascular;  Laterality: Right;  . LIGATION OF ARTERIOVENOUS  FISTULA Right 08/25/2012   Procedure: LIGATION OF ARTERIOVENOUS  FISTULA;  Surgeon: [name], MD;  Location: [facility] OR;  Service: Vascular;  Laterality: Right;  Ligation of right brachial vein transposition  . PERIPHERAL VASCULAR ATHERECTOMY  06/25/2017   Procedure: PERIPHERAL VASCULAR ATHERECTOMY;  Surgeon: Maeola Harman, MD;  Location: [facility] INVASIVE CV LAB;  Service: Cardiovascular;;  Rt. PT  . REMOVAL OF A DIALYSIS CATHETER Right 05/05/2012   Procedure: REMOVAL OF A DIALYSIS CATHETER;  Surgeon: Chuck Hint, MD;  Location: [facility] OR;  Service: Vascular;  Laterality: Right;  . REMOVAL OF A DIALYSIS CATHETER Right 10/01/2016   Procedure: REMOVAL OF A DIALYSIS CATHETER-RIGHT UPPER CHEST;  Surgeon: [name], MD;  Location: [facility] OR;  Service: Vascular;  Laterality: Right;  . REMOVAL OF A DIALYSIS CATHETER Right 11/26/2016   Procedure: REMOVAL OF RIGHT FEMORAL TUNNEL DIALYSIS CATHETER;  Surgeon: [name], MD;  Location: [facility] OR;  Service: Vascular;  Laterality: Right;  . SPHINCTEROTOMY N/A 05/15/2017   Procedure: POSSIBLE SPHINCTEROTOMY (BOTOX);  Surgeon: Romie Levee, MD;  Location: [facility];  Service: General;  Laterality: N/A;  . TOE AMPUTATION  02/2007   left foot first 3 toes  . UPPER EXTREMITY VENOGRAPHY Bilateral 07/18/2016   Procedure: Bilateral Upper Extremity Venography;  Surgeon: [name], MD;  Location: [facility] INVASIVE CV LAB;  Service: Cardiovascular;  Laterality: Bilateral;    Family History   Problem Relation Age of Onset  . Diabetes Mother   . Hypertension Mother   . AAA (abdominal aortic aneurysm) Mother   . Diabetes Father   . Hypertension Father    Social History:  reports that he has never smoked. He has never used smokeless tobacco. He reports that he does not drink alcohol or use drugs.  Allergies:  Allergies  Allergen Reactions  . Lisinopril Other (See Comments)    HYPERKALEMIA RENAL DYSFUNCTION PROGRESSION     No medications prior to admission.    No results found for this or any previous visit (from the past 48 hour(s)). No results found.  Review of Systems  All other systems reviewed and are negative.   There were no vitals taken for this visit. Physical Exam   Patient is alert, oriented, no adenopathy, well-dressed, normal affect, normal respiratory effort. Examination patient has increased necrotic changes of the left foot his entire heel pad is black and necrotic he has new necrotic ulcers beneath the fourth and fifth metatarsal head with necrotic toes 4 and 5 there is no ascending cellulitis his leg is thin and atrophic.  Examination of the right transtibial amputation has healed  and patient is wearing his stump shrinker.  Assessment/Plan 1. History of right below knee amputation (HCC)   Progressive gangrenous changes left foot  Plan: I had a long discussion with the patient and his family.  Discussed that ideally he would be able to heal a above-knee amputation of the left and that should be the safest surgical option.  The patient's family would like to proceed with a below the knee amputation.  Discussed the patient has about a 50% chance of healing a below the knee amputation on the left.  Patient has an albumin of 1.9.  We will set up a below the knee amputation at their convenience he has dialysis on Monday Wednesday Friday discussed that we could do the surgery this Friday and proceed with dialysis on Saturday at [facility] he would need to stay at  least 3 days with plan for discharge to skilled nursing.    Nadara Mustard, MD 01/02/2018, 6:44 AM

## 2018-01-02 NOTE — Transfer of Care (Signed)
Immediate Anesthesia Transfer of Care Note  Patient: Charles Vasquez  Procedure(s) Performed: LEFT BELOW KNEE AMPUTATION (Left Leg Lower)  Patient Location: PACU  Anesthesia Type:MAC  Level of Consciousness: oriented, drowsy and patient cooperative  Airway & Oxygen Therapy: Patient Spontanous Breathing and Patient connected to face mask oxygen  Post-op Assessment: Report given to RN and Post -op Vital signs reviewed and stable  Post vital signs: Reviewed  Last Vitals:  Vitals Value Taken Time  BP 128/55 01/02/2018 11:25 AM  Temp    Pulse 76 01/02/2018 11:27 AM  Resp 23 01/02/2018 11:27 AM  SpO2 97 % 01/02/2018 11:27 AM  Vitals shown include unvalidated device data.  Last Pain:  Vitals:   01/02/18 1010  TempSrc: Oral  PainSc:       Patients Stated Pain Goal: 0 (29/[id number])  Complications: No apparent anesthesia complications

## 2018-01-02 NOTE — Anesthesia Procedure Notes (Signed)
Anesthesia Regional Block: Adductor canal block   Pre-Anesthetic Checklist: ,, timeout performed, Correct Patient, Correct Site, Correct Laterality, Correct Procedure, Correct Position, site marked, Risks and benefits discussed,  Surgical consent,  Pre-op evaluation,  At surgeon's request and post-op pain management  Laterality: Left  Prep: chloraprep       Needles:  Injection technique: Single-shot  Needle Type: Echogenic Needle     Needle Length: 9cm  Needle Gauge: 21     Additional Needles:   Procedures:,,,, ultrasound used (permanent image in chart),,,,  Narrative:  Start time: 01/02/2018 9:34 AM End time: 01/02/2018 9:38 AM Injection made incrementally with aspirations every 5 mL.  Performed by: Personally  Anesthesiologist: Cecile Hearing, MD  Additional Notes: No pain on injection. No increased resistance to injection. Injection made in 5cc increments.  Good needle visualization.  Patient tolerated procedure well.

## 2018-01-02 NOTE — Anesthesia Procedure Notes (Signed)
Anesthesia Regional Block: Popliteal block   Pre-Anesthetic Checklist: ,, timeout performed, Correct Patient, Correct Site, Correct Laterality, Correct Procedure, Correct Position, site marked, Risks and benefits discussed,  Surgical consent,  Pre-op evaluation,  At surgeon's request and post-op pain management  Laterality: Left  Prep: chloraprep       Needles:  Injection technique: Single-shot  Needle Type: Echogenic Needle     Needle Length: 9cm  Needle Gauge: 21     Additional Needles:   Procedures:,,,, ultrasound used (permanent image in chart),,,,  Narrative:  Start time: 01/02/2018 9:29 AM End time: 01/02/2018 9:34 AM Injection made incrementally with aspirations every 5 mL.  Performed by: Personally  Anesthesiologist: Cecile Hearing, MD  Additional Notes: No pain on injection. No increased resistance to injection. Injection made in 5cc increments.  Good needle visualization.  Patient tolerated procedure well.

## 2018-01-02 NOTE — Plan of Care (Signed)

## 2018-01-02 NOTE — Consult Note (Addendum)
[facility] Renal Consultation Note    Indication for Consultation:  Management of ESRD/hemodialysis; anemia, hypertension/volume and secondary hyperparathyroidism  ZOX:WRUEAVW, Myra Gianotti, MD  HPI: Charles Vasquez is a 82 y.o. male. ESRD on HD MWF at [facility].  Past medical history significant for HTN, HLD, DM, stroke, GERD, CAD, and PVD s/p R BKA on 07/15/2017.    Seen and examined at bedside.  Presented today for scheduled surgery due to wound on L foot.  Still a little sleepy post surgery.  Denies pain, SOB, CP, and n/v/d.  Last HD was at his OP center on 10/23.    Patient has been admitted by Dr. Lajoyce Corners for scheduled L BKA due to gangrenous changes of the left foot.  Pertinent labs prior to surgery include Hgb 7.4, K 4.7, Cr 6.86, BUN 62, and Ca 7.7.  Patient received 1 unit pRBC prior to surgery.     Past Medical History:  Diagnosis Date  . Anal pain   . AVF (arteriovenous fistula) (HCC) 05-13-2017 per pt currently AVF access used is left thigh   hx multiple AVF surgery's (previously left upper arm, bilateral thigh) last surgery -- right upper arm creation 11-26-2016  . ESRD (end stage renal disease) on dialysis [facility])    [facility] in [place], [place] on MWF   . Gangrene (HCC)    left leg  . History of CVA (cerebrovascular accident)    05-13-2017  per pt stated "thats what they told yrs ago"  per pt no residual  . Hyperlipidemia   . Hypertension   . Myocardial infarction (HCC)   . Stroke (HCC)   . Type 2 diabetes mellitus treated with insulin (HCC)    Type II   Past Surgical History:  Procedure Laterality Date  . ABDOMINAL AORTOGRAM W/LOWER EXTREMITY N/A 06/25/2017   Procedure: ABDOMINAL AORTOGRAM W/LOWER EXTREMITY;  Surgeon: Maeola Harman, MD;  Location: [facility] INVASIVE CV LAB;  Service: Cardiovascular;  Laterality: N/A;  . AMPUTATION Right 01/01/2017   Procedure: REVISION AMPUTATION RIGHT INDEX FINGER;  Surgeon: Dairl Ponder, MD;  Location: [facility]  OR;  Service: Orthopedics;  Laterality: Right;  . AMPUTATION Right 07/15/2017   Procedure: AMPUTATION BELOW KNEE RIGHT;  Surgeon: [name], MD;  Location: [facility] OR;  Service: Vascular;  Laterality: Right;  . AMPUTATION Right 10/08/2017   Procedure: RIGHT INDEX, RIGHT LONG REVISION AMPUTATIONS;  Surgeon: Dairl Ponder, MD;  Location: [facility] OR;  Service: Orthopedics;  Laterality: Right;  . ARTERIOVENOUS GRAFT PLACEMENT    . ARTERIOVENOUS GRAFT PLACEMENT Left 10/22/2016   thigh  . AV FISTULA PLACEMENT  03/31/2012   Procedure: ARTERIOVENOUS (AV) FISTULA CREATION;  Surgeon: [name], MD;  Location: [facility] OR;  Service: Vascular;  Laterality: Right;  First stage Brachial vein transposition  . AV FISTULA PLACEMENT Right 08/25/2012   Procedure: INSERTION OF ARTERIOVENOUS (AV) GORE-TEX GRAFT ARM;  Surgeon: [name], MD;  Location: [facility] OR;  Service: Vascular;  Laterality: Right;  . AV FISTULA PLACEMENT Left 07/23/2016   Procedure: INSERTION OF ARTERIOVENOUS (AV) GORE-TEX GRAFT LEFT UPPER ARM;  Surgeon: [name], MD;  Location: [facility] OR;  Service: Vascular;  Laterality: Left;  . AV FISTULA PLACEMENT Left 10/22/2016   Procedure: INSERTION OF 4-99mm x 45cm  ARTERIOVENOUS (AV) GORE-TEX GRAFT THIGH-LEFT;  Surgeon: [name], MD;  Location: [facility] OR;  Service: Vascular;  Laterality: Left;  . CATARACT EXTRACTION W/ INTRAOCULAR LENS  IMPLANT, BILATERAL    . COLONOSCOPY N/A 03/25/2017  Dr. Darrick Penna: examined portion of ileum normal. Redundant left colon, stricture at anus   . ESOPHAGOGASTRODUODENOSCOPY N/A 03/25/2017   Dr. Darrick Penna: widely patent Schatzki ring at GE junction s/p dilation, atrophic gastritis, single duodenal AVM, non-bleeding  . INSERTION OF DIALYSIS CATHETER Left 05/05/2012   Procedure: INSERTION OF DIALYSIS CATHETER;  Surgeon: Chuck Hint, MD;  Location: [facility] OR;  Service: Vascular;  Laterality: Left;  . INSERTION OF DIALYSIS CATHETER Right 10/01/2016   Procedure: INSERTION OF DIALYSIS  CATHETER- RIGHT FEMORAL;  Surgeon: [name], MD;  Location: [facility] OR;  Service: Vascular;  Laterality: Right;  . IR FLUORO GUIDE CV LINE RIGHT  06/10/2016  . IR GENERIC HISTORICAL  06/07/2016   IR US GUIDE VASC ACCESS RIGHT 06/07/2016 Oley Balm, MD [facility]-INTERV RAD  . IR THROMBECTOMY AV FISTULA W/THROMBOLYSIS/PTA INC/SHUNT/IMG RIGHT Right 06/07/2016  . IR US GUIDE VASC ACCESS RIGHT  06/10/2016  . LIGATION ARTERIOVENOUS GORTEX GRAFT Left 08/04/2016   Procedure: LIGATION ARTERIOVENOUS GORTEX GRAFT;  Surgeon: Larina Earthly, MD;  Location: [facility] OR;  Service: Vascular;  Laterality: Left;  . LIGATION ARTERIOVENOUS GORTEX GRAFT Right 11/26/2016   Procedure: LIGATION OF RIGHT UPPER ARM ARTERIOVENOUS GORTEX GRAFT;  Surgeon: [name], MD;  Location: [facility] OR;  Service: Vascular;  Laterality: Right;  . LIGATION OF ARTERIOVENOUS  FISTULA Right 08/25/2012   Procedure: LIGATION OF ARTERIOVENOUS  FISTULA;  Surgeon: [name], MD;  Location: [facility] OR;  Service: Vascular;  Laterality: Right;  Ligation of right brachial vein transposition  . PERIPHERAL VASCULAR ATHERECTOMY  06/25/2017   Procedure: PERIPHERAL VASCULAR ATHERECTOMY;  Surgeon: Maeola Harman, MD;  Location: [facility] INVASIVE CV LAB;  Service: Cardiovascular;;  Rt. PT  . REMOVAL OF A DIALYSIS CATHETER Right 05/05/2012   Procedure: REMOVAL OF A DIALYSIS CATHETER;  Surgeon: Chuck Hint, MD;  Location: [facility] OR;  Service: Vascular;  Laterality: Right;  . REMOVAL OF A DIALYSIS CATHETER Right 10/01/2016   Procedure: REMOVAL OF A DIALYSIS CATHETER-RIGHT UPPER CHEST;  Surgeon: [name], MD;  Location: [facility] OR;  Service: Vascular;  Laterality: Right;  . REMOVAL OF A DIALYSIS CATHETER Right 11/26/2016   Procedure: REMOVAL OF RIGHT FEMORAL TUNNEL DIALYSIS CATHETER;  Surgeon: [name], MD;  Location: [facility] OR;  Service: Vascular;  Laterality: Right;  . SPHINCTEROTOMY N/A 05/15/2017   Procedure: POSSIBLE SPHINCTEROTOMY (BOTOX);  Surgeon: Romie Levee, MD;   Location: [facility];  Service: General;  Laterality: N/A;  . TOE AMPUTATION  02/2007   left foot first 3 toes  . UPPER EXTREMITY VENOGRAPHY Bilateral 07/18/2016   Procedure: Bilateral Upper Extremity Venography;  Surgeon: [name], MD;  Location: [facility] INVASIVE CV LAB;  Service: Cardiovascular;  Laterality: Bilateral;   Family History  Problem Relation Age of Onset  . Diabetes Mother   . Hypertension Mother   . AAA (abdominal aortic aneurysm) Mother   . Diabetes Father   . Hypertension Father    Social History:  reports that he has never smoked. He has never used smokeless tobacco. He reports that he does not drink alcohol or use drugs. Allergies  Allergen Reactions  . Lisinopril Other (See Comments)    HYPERKALEMIA RENAL DYSFUNCTION PROGRESSION    Prior to Admission medications   Medication Sig Start Date End Date Taking? Authorizing Provider  amitriptyline (ELAVIL) 25 MG tablet Take 25 mg by mouth daily at 8 pm. (2000)   Yes [provider]  Darbepoetin Alfa (ARANESP) 200 MCG/0.4ML SOSY injection  Inject 0.4 mLs (200 mcg total) into the vein every Monday with hemodialysis. 08/04/17  Yes Angiulli, Mcarthur Rossetti, PA-C  dicyclomine (BENTYL) 10 MG capsule 1 PO 30 MINUTES PRIOR TO BREAKFAST AND LUNCH Patient taking differently: Take 10 mg by mouth 2 (two) times daily. (0800 & 2000) 03/25/17  Yes Fields, Darleene Cleaver, MD  doxercalciferol (HECTOROL) 4 MCG/2ML injection Inject 1 mL (2 mcg total) into the vein every Monday, Wednesday, and Friday with hemodialysis. 07/30/17  Yes Angiulli, Mcarthur Rossetti, PA-C  glucagon (GLUCAGON EMERGENCY) 1 MG injection Inject 1 mg into the vein daily as needed (hypoglycemia).   Yes [provider]  HYDROcodone-acetaminophen (NORCO/VICODIN) 5-325 MG tablet Take 1 tablet by mouth every 6 (six) hours as needed (pain).   Yes [provider]  insulin glargine (LANTUS) 100 UNIT/ML injection Inject 0.08 mLs (8 Units total) into the skin  every Monday, Wednesday, and Friday at 8 PM. Patient taking differently: Inject 8 Units into the skin every Monday, Wednesday, and Friday at 8 PM.  07/30/17  Yes Angiulli, Mcarthur Rossetti, PA-C  insulin lispro (HUMALOG) 100 UNIT/ML injection Inject 0-8 Units into the skin 3 (three) times daily before meals. Blood sugar of 70-100=0 units 101-250=1 unit 151-200=2 units 201-250=4 units  251-300=6 units 301-350= 8 units Greater than 350= 10 units **Call MD if glucose greater than 450**   Yes [provider]  iron polysaccharides (NIFEREX) 150 MG capsule Take 1 capsule (150 mg total) by mouth 2 (two) times daily. Patient taking differently: Take 150 mg by mouth 2 (two) times daily. (0800 & 2000) 07/30/17  Yes Angiulli, Mcarthur Rossetti, PA-C  multivitamin (RENA-VIT) TABS tablet Take 1 tablet by mouth daily. 07/30/17  Yes Angiulli, Mcarthur Rossetti, PA-C  Nutritional Supplements (FEEDING SUPPLEMENT, NEPRO CARB STEADY,) LIQD Take 237 mLs by mouth 2 (two) times daily.   Yes [provider]  pantoprazole (PROTONIX) 40 MG tablet Take 1 tablet (40 mg total) by mouth 2 (two) times daily with a meal. Patient taking differently: Take 40 mg by mouth 2 (two) times daily with a meal. (0600 & 1600) 07/30/17  Yes Angiulli, Mcarthur Rossetti, PA-C  pentoxifylline (TRENTAL) 400 MG CR tablet Take 1 tablet (400 mg total) by mouth 3 (three) times daily with meals. 09/16/17  Yes Nadara Mustard, MD  sevelamer carbonate (RENVELA) 800 MG tablet Take 2 tablets (1,600 mg total) by mouth 3 (three) times daily with meals. Patient taking differently: Take 1,600 mg by mouth 3 (three) times daily with meals. (0800, 1200, 1700) 07/30/17  Yes Angiulli, Mcarthur Rossetti, PA-C  acetaminophen (TYLENOL) 650 MG CR tablet Take 650 mg by mouth every 6 (six) hours as needed for pain.     [provider]  collagenase (SANTYL) ointment Apply 1 application topically daily. Patient not taking: Reported on 12/31/2017 09/24/17   Adonis Huguenin, NP  diclofenac  sodium (VOLTAREN) 1 % GEL Apply 2 g topically 4 (four) times daily. Patient not taking: Reported on 12/31/2017 07/30/17   Angiulli, Mcarthur Rossetti, PA-C  ipratropium-albuterol (DUONEB) 0.5-2.5 (3) MG/3ML SOLN Take 3 mLs by nebulization every 6 (six) hours as needed (shortness of breath).    [provider]  lidocaine (LIDODERM) 5 % Place 1 patch onto the skin daily. Remove & Discard patch within 12 hours or as directed by MD Patient not taking: Reported on 12/31/2017 07/30/17   Angiulli, Mcarthur Rossetti, PA-C  losartan (COZAAR) 50 MG tablet Take 50 mg by mouth daily.     [provider]  Polyethylene Glycol 1000 POWD Take 17 g by mouth daily as needed (constipation).    [provider]  senna (SENOKOT) 8.6 MG tablet Take 2 tablets by mouth daily as needed for constipation.    [provider]  traMADol (ULTRAM) 50 MG tablet Take 1 tablet (50 mg total) by mouth every 6 (six) hours as needed. Patient taking differently: Take 50 mg by mouth every 6 (six) hours as needed (pain).  07/30/17   Angiulli, Mcarthur Rossetti, PA-C   Current Facility-Administered Medications  Medication Dose Route Frequency Provider Last Rate Last Dose  . 0.9 %  sodium chloride infusion   Intravenous Continuous Nadara Mustard, MD      . Melene Muller ON 01/03/2018] acetaminophen (TYLENOL) tablet 325-650 mg  325-650 mg Oral Q6H PRN Nadara Mustard, MD      . amitriptyline (ELAVIL) tablet 25 mg  25 mg Oral Q2000 Nadara Mustard, MD      . aspirin EC tablet 325 mg  325 mg Oral Daily Nadara Mustard, MD      . bisacodyl (DULCOLAX) suppository 10 mg  10 mg Rectal Daily PRN Nadara Mustard, MD      . ceFAZolin (ANCEF) IVPB 1 g/50 mL premix  1 g Intravenous Q12H Nadara Mustard, MD      . docusate sodium (COLACE) capsule 100 mg  100 mg Oral BID Nadara Mustard, MD      . Melene Muller ON 01/05/2018] doxercalciferol (HECTOROL) injection 2 mcg  2 mcg Intravenous Q M,W,F-HD Nadara Mustard, MD      . feeding supplement (NEPRO CARB STEADY)  liquid 237 mL  237 mL Oral BID Nadara Mustard, MD      . HYDROcodone-acetaminophen (NORCO) 7.5-325 MG per tablet 1-2 tablet  1-2 tablet Oral Q4H PRN Nadara Mustard, MD      . HYDROcodone-acetaminophen (NORCO/VICODIN) 5-325 MG per tablet 1-2 tablet  1-2 tablet Oral Q4H PRN Nadara Mustard, MD      . insulin aspart (novoLOG) 100 UNIT/ML injection           . insulin aspart (novoLOG) injection 0-9 Units  0-9 Units Subcutaneous TID WC Nadara Mustard, MD      . insulin aspart (novoLOG) injection 3 Units  3 Units Subcutaneous TID WC Nadara Mustard, MD      . insulin glargine (LANTUS) injection 8 Units  8 Units Subcutaneous Q M,W,F-2000 Nadara Mustard, MD      . ipratropium-albuterol (DUONEB) 0.5-2.5 (3) MG/3ML nebulizer solution 3 mL  3 mL Nebulization Q6H PRN Nadara Mustard, MD      . losartan (COZAAR) tablet 50 mg  50 mg Oral Daily Nadara Mustard, MD      . magnesium citrate solution 1 Bottle  1 Bottle Oral Once PRN Nadara Mustard, MD      . methocarbamol (ROBAXIN) tablet 500 mg  500 mg Oral Q6H PRN Nadara Mustard, MD       Or  . methocarbamol (ROBAXIN) 500 mg in dextrose 5 % 50 mL IVPB  500 mg Intravenous Q6H PRN Nadara Mustard, MD      . metoCLOPramide (REGLAN) tablet 5-10 mg  5-10 mg Oral Q8H PRN Nadara Mustard, MD       Or  . metoCLOPramide (REGLAN) injection 5-10 mg  5-10 mg Intravenous Q8H PRN Nadara Mustard, MD      . morphine 2 MG/ML injection 0.5-1 mg  0.5-1 mg Intravenous  Q2H PRN Nadara Mustard, MD      . multivitamin (RENA-VIT) tablet 1 tablet  1 tablet Oral Daily Nadara Mustard, MD      . ondansetron [facility]) tablet 4 mg  4 mg Oral Q6H PRN Nadara Mustard, MD       Or  . ondansetron [facility]) injection 4 mg  4 mg Intravenous Q6H PRN Nadara Mustard, MD      . pantoprazole (PROTONIX) EC tablet 40 mg  40 mg Oral BID WC Nadara Mustard, MD      . polyethylene glycol (MIRALAX / GLYCOLAX) packet 17 g  17 g Oral Daily PRN Nadara Mustard, MD      . sevelamer carbonate (RENVELA) tablet 1,600 mg   1,600 mg Oral TID WC Nadara Mustard, MD       Facility-Administered Medications Ordered in Other Encounters  Medication Dose Route Frequency Provider Last Rate Last Dose  . 0.9 %  sodium chloride infusion (Manually program via Guardrails IV Fluids)   Intravenous Once Rayburn, Fanny Bien, PA-C       Labs: Basic Metabolic Panel: Recent Labs  Lab 01/02/18 0747  NA 133*  K 4.7  CL 96*  CO2 24  GLUCOSE 218*  BUN 62*  CREATININE 6.86*  CALCIUM 7.7*   CBC: Recent Labs  Lab 01/02/18 0747  WBC 8.7  HGB 7.4*  HCT 27.0*  MCV 87.9  PLT 217   CBG: Recent Labs  Lab 01/02/18 0714 01/02/18 0957 01/02/18 1127  GLUCAP 188* 205* 235*    ROS: All others negative except those listed in HPI.  Physical Exam: Vitals:   01/02/18 1310 01/02/18 1325 01/02/18 1340 01/02/18 1410  BP: 130/62 130/60 130/60 (!) 128/58  Pulse: 76 72 75 75  Resp: 15 15 19 18   Temp:    97.7 F (36.5 C)  TempSrc:    Axillary  SpO2: 96% 95% 95% 100%  Weight:      Height:         General: NAD, thin chronically ill appearing male Head: NCAT, sclera not icteric, MMM Neck: Supple. No lymphadenopathy Lungs: CTAB anteriorly. No wheeze, rales or rhonchi. Breathing is unlabored. Heart: RRR. No murmur, rubs or gallops.  Abdomen: soft, nontender, +BS, no guarding, no rebound tenderness Lower extremities:b/l BKA - L leg wrapped w/wound vac in place.  Neuro: a little groggy and confused from anesthesia. Moves all extremities spontaneously. Psych:  Responds to questions appropriately with a normal affect. Dialysis Access: L thigh AVG +b/t  Dialysis Orders:  MWF  - [name]  3.25hrs, EDW 56.5kg, 2K/ 2.5Ca  Access: L thigh AVG  Heparin 2000 load and 500 qhr EPO 14000 qHD   Assessment/Plan: 1.  Gangrenous L foot - s/p L BKA Dr. Lajoyce Corners today 2.  ESRD -  On HD MWF.  K 4.7. Plan for HD tomorrow off schedule and resume regular schedule Monday.  3.  Hypertension/volume  - BP currently well controlled.  Only  0.7kg over EDW pre surgery.  Does not appear volume overloaded on exam.   4.  Anemia of CKD - Hgb 7.4 pre surgery, s/p 1Unit pRBC.  Recheck Hgb, can give pRBC w/HD tomorrow if needed. Give Aranesp qwk w/HD tomorrow.  5.  Secondary Hyperparathyroidism -  OP Ca at goal per OP center. Recheck labs pre HD tomorrow. 6.  Nutrition - Last alb 1.9. Renal diet w/fluid restrictions once advanced. Nepro. Renavite.  Virgina Norfolk, PA-C [facility] Pager: [phone]  01/02/2018, 2:30 PM    I have seen and examined this patient and agree with plan and assessment in the above note with renal recommendations/intervention highlighted.  Off schedule, will plan for HD on Saturday and then back on schedule Monday. Jomarie Longs A Rether Rison,MD 01/03/2018 9:43 AM

## 2018-01-02 NOTE — Anesthesia Procedure Notes (Signed)
Procedure Name: MAC Date/Time: 01/02/2018 10:23 AM Performed by: Jenne Campus, CRNA Pre-anesthesia Checklist: Patient identified, Emergency Drugs available, Suction available and Patient being monitored Oxygen Delivery Method: Simple face mask

## 2018-01-02 NOTE — Progress Notes (Signed)
Inpatient Diabetes Program Recommendations  AACE/ADA: New Consensus Statement on Inpatient Glycemic Control (2015)  Target Ranges:  Prepandial:   less than 140 mg/dL      Peak postprandial:   less than 180 mg/dL (1-2 hours)      Critically ill patients:  140 - 180 mg/dL    Review of Glycemic Control  Diabetes history: DM 2 Outpatient Diabetes medications: Lantus 8 units (MWF),  and Humalog 0-8 units tid Current orders for Inpatient glycemic control: In OR procedure  A1c 6.6% on 7/31, Noted Hgb low due to renal status  Inpatient Diabetes Program Recommendations:    Based on renal function for post operative glucose control, consider Novolog Correction scale 0-9 units tid and Lantus 4 units Monday, Wednesday, and Friday QHS.  Thanks,  Christena Deem RN, MSN, BC-ADM Inpatient Diabetes Coordinator Team Pager [phone] (8a-5p)

## 2018-01-02 NOTE — Op Note (Addendum)
   Date of Surgery: 01/02/2018  INDICATIONS: Charles Vasquez is a 82 y.o.-year-old male who is status post a right transtibial amputation who presents at this time with gangrenous changes to the left foot.  Discussed with the family recommendations to proceed with above-the-knee amputation patient and family wish to proceed with a below the knee amputation present patient presents at this time for transtibial amputation.Marland Kitchen  PREOPERATIVE DIAGNOSIS: Gangrene left foot  POSTOPERATIVE DIAGNOSIS: Same.  PROCEDURE: Transtibial amputation Application of Prevena wound VAC combined with the restore VAC  SURGEON: Lajoyce Corners, M.D.  ANESTHESIA: Regional block  IV FLUIDS AND URINE: See anesthesia.  ESTIMATED BLOOD LOSS: 100 cc mL.  COMPLICATIONS: None.  DESCRIPTION OF PROCEDURE: The patient was brought to the operating room and underwent a general anesthetic. After adequate levels of anesthesia were obtained patient's lower extremity was prepped using DuraPrep draped into a sterile field. A timeout was called. The foot was draped out of the sterile field with impervious stockinette. A transverse incision was made 11 cm distal to the tibial tubercle. This curved proximally and a large posterior flap was created. The tibia was transected 1 cm proximal to the skin incision. The fibula was transected just proximal to the tibial incision. The tibia was beveled anteriorly. A large posterior flap was created. The sciatic nerve was pulled cut and allowed to retract. The vascular bundles were suture ligated with 2-0 silk. The deep and superficial fascial layers were closed using #1 Vicryl. The skin was closed using staples and 2-0 nylon. The wound was covered with a Prevena wound VAC in combination with the restore VAC.Marland Kitchen There was a good suction fit. A prosthetic shrinker will be applied. Patient was  taken to the PACU in stable condition.   DISCHARGE PLANNING:  Antibiotic duration: Preoperative antibiotics with 1  postoperative dose patient on dialysis  Weightbearing: Nonweightbearing on the left  Pain medication: Low-dose opioid pathway  Dressing care/ Wound VAC: Continue wound VAC for 1 week  Discharge to: Discharge to skilled nursing.  Follow-up: In the office 1 week post operative.  Aldean Baker, MD [facility] 11:28 AM

## 2018-01-03 ENCOUNTER — Other Ambulatory Visit: Payer: Self-pay

## 2018-01-03 LAB — BPAM RBC
Blood Product Expiration Date: 201911262359
ISSUE DATE / TIME: 201910250850
UNIT TYPE AND RH: 5100

## 2018-01-03 LAB — TYPE AND SCREEN
ABO/RH(D): O POS
Antibody Screen: NEGATIVE
Unit division: 0

## 2018-01-03 LAB — RENAL FUNCTION PANEL
ANION GAP: 14 (ref 5–15)
Albumin: 2.4 g/dL — ABNORMAL LOW (ref 3.5–5.0)
BUN: 76 mg/dL — ABNORMAL HIGH (ref 8–23)
CHLORIDE: 96 mmol/L — AB (ref 98–111)
CO2: 22 mmol/L (ref 22–32)
Calcium: 7.6 mg/dL — ABNORMAL LOW (ref 8.9–10.3)
Creatinine, Ser: 8.15 mg/dL — ABNORMAL HIGH (ref 0.61–1.24)
GFR calc Af Amer: 6 mL/min — ABNORMAL LOW (ref >60)
GFR calc non Af Amer: 5 mL/min — ABNORMAL LOW (ref >60)
GLUCOSE: 258 mg/dL — AB (ref 70–99)
Phosphorus: 3.8 mg/dL (ref 2.5–4.6)
Potassium: 5.4 mmol/L — ABNORMAL HIGH (ref 3.5–5.1)
Sodium: 132 mmol/L — ABNORMAL LOW (ref 135–145)

## 2018-01-03 LAB — GLUCOSE, CAPILLARY
GLUCOSE-CAPILLARY: 266 mg/dL — AB (ref 70–99)
Glucose-Capillary: 151 mg/dL — ABNORMAL HIGH (ref 70–99)
Glucose-Capillary: 111 mg/dL — ABNORMAL HIGH (ref 70–99)
Glucose-Capillary: 208 mg/dL — ABNORMAL HIGH (ref 70–99)
Glucose-Capillary: 177 mg/dL — ABNORMAL HIGH (ref 70–99)

## 2018-01-03 LAB — CBC
HEMATOCRIT: 28.1 % — AB (ref 39.0–52.0)
HEMOGLOBIN: 8.3 g/dL — AB (ref 13.0–17.0)
MCH: 25.8 pg — ABNORMAL LOW (ref 26.0–34.0)
MCHC: 29.5 g/dL — ABNORMAL LOW (ref 30.0–36.0)
MCV: 87.3 fL (ref 80.0–100.0)
NRBC: 0 % (ref 0.0–0.2)
Platelets: 213 10*3/uL (ref 150–400)
RBC: 3.22 MIL/uL — ABNORMAL LOW (ref 4.22–5.81)
RDW: 17 % — ABNORMAL HIGH (ref 11.5–15.5)
WBC: 11.6 10*3/uL — AB (ref 4.0–10.5)

## 2018-01-03 MED ORDER — DARBEPOETIN ALFA 150 MCG/0.3ML IJ SOSY
PREFILLED_SYRINGE | INTRAMUSCULAR | Status: AC
  Filled 2018-01-03: qty 0.3

## 2018-01-03 MED ORDER — SODIUM CHLORIDE 0.9 % IV SOLN: 100.0000 mL | INTRAVENOUS | Status: DC | PRN

## 2018-01-03 MED ORDER — LIDOCAINE-PRILOCAINE 2.5-2.5 % EX CREA
1.0000 "application " | TOPICAL_CREAM | CUTANEOUS | Status: DC | PRN
  Filled 2018-01-03: qty 5

## 2018-01-03 MED ORDER — HEPARIN SODIUM (PORCINE) 1000 UNIT/ML DIALYSIS
1000.0000 [IU] | INTRAMUSCULAR | Status: DC | PRN
  Filled 2018-01-03: qty 1

## 2018-01-03 MED ORDER — ALTEPLASE 2 MG IJ SOLR
2.0000 mg | Freq: Once | INTRAMUSCULAR | Status: DC | PRN
  Filled 2018-01-03: qty 2

## 2018-01-03 MED ORDER — PENTAFLUOROPROP-TETRAFLUOROETH EX AERO: 1.0000 "application " | INHALATION_SPRAY | CUTANEOUS | Status: DC | PRN

## 2018-01-03 MED ORDER — LIDOCAINE HCL (PF) 1 % IJ SOLN: 5.0000 mL | INTRAMUSCULAR | Status: DC | PRN

## 2018-01-03 NOTE — Social Work (Signed)
CSW acknowledging consult for SNF placement. Will follow for therapy recommendations.   Kendell Sagraves H Martinique Pizzimenti, LCSWA [place] Clinical Social Work [phone]   

## 2018-01-03 NOTE — Procedures (Signed)
I was present at this dialysis session. I have reviewed the session itself and made appropriate changes.   Vital signs in last 24 hours:  Temp:  [97.3 F (36.3 C)-98.6 F (37 C)] 97.8 F (36.6 C) (10/26 0842) Pulse Rate:  [72-90] 87 (10/26 0930) Resp:  [13-26] 16 (10/26 0930) BP: (106-166)/(55-76) 106/70 (10/26 0930) SpO2:  [94 %-100 %] 98 % (10/26 0842) Weight:  [57.5 kg] 57.5 kg (10/26 0842) Weight change:  Filed Weights   01/02/18 0923 01/03/18 0842  Weight: 57.2 kg 57.5 kg    Recent Labs  Lab 01/03/18 0916  NA 132*  K 5.4*  CL 96*  CO2 22  GLUCOSE 258*  BUN 76*  CREATININE 8.15*  CALCIUM 7.6*  PHOS 3.8    Recent Labs  Lab 01/02/18 0747 01/03/18 0916  WBC 8.7 11.6*  HGB 7.4* 8.3*  HCT 27.0* 28.1*  MCV 87.9 87.3  PLT 217 213    Scheduled Meds: . amitriptyline  25 mg Oral Q2000  . aspirin EC  325 mg Oral Daily  . Chlorhexidine Gluconate Cloth  6 each Topical Q0600  . Darbepoetin Alfa      . darbepoetin (ARANESP) injection - DIALYSIS  150 mcg Intravenous Q Sat-HD  . docusate sodium  100 mg Oral BID  . [START ON 01/05/2018] doxercalciferol  2 mcg Intravenous Q M,W,F-HD  . feeding supplement (NEPRO CARB STEADY)  237 mL Oral BID  . insulin aspart  0-9 Units Subcutaneous TID WC  . insulin aspart  3 Units Subcutaneous TID WC  . insulin glargine  8 Units Subcutaneous Q M,W,F-2000  . losartan  50 mg Oral Daily  . multivitamin  1 tablet Oral Daily  . pantoprazole  40 mg Oral BID WC  . sevelamer carbonate  1,600 mg Oral TID WC   Continuous Infusions: . sodium chloride 10 mL/hr at 01/02/18 1410  . sodium chloride    . sodium chloride    . methocarbamol (ROBAXIN) IV     PRN Meds:.sodium chloride, sodium chloride, acetaminophen, alteplase, bisacodyl, heparin, HYDROcodone-acetaminophen, HYDROcodone-acetaminophen, ipratropium-albuterol, lidocaine (PF), lidocaine-prilocaine, magnesium citrate, methocarbamol **OR** methocarbamol (ROBAXIN) IV, metoCLOPramide **OR**  metoCLOPramide (REGLAN) injection, morphine injection, ondansetron **OR** ondansetron (ZOFRAN) IV, pentafluoroprop-tetrafluoroeth, polyethylene glycol   Irena Cords,  MD 01/03/2018, 9:46 AM

## 2018-01-03 NOTE — Progress Notes (Signed)
Subjective: 1 Day Post-Op Procedure(s) (LRB): LEFT BELOW KNEE AMPUTATION (Left) Patient reports pain as mild.  Main complaint of no BM.   Objective: Vital signs in last 24 hours: Temp:  [97.3 F (36.3 C)-98.6 F (37 C)] 98 F (36.7 C) (10/26 0756) Pulse Rate:  [72-88] 88 (10/26 0756) Resp:  [13-26] 18 (10/26 0756) BP: (120-166)/(52-76) 154/68 (10/26 0756) SpO2:  [94 %-100 %] 95 % (10/26 0756) Weight:  [57.2 kg] 57.2 kg (10/25 0923)  Intake/Output from previous day: 10/25 0701 - 10/26 0700 In: 615 [P.O.:200; I.V.:100; Blood:315] Out: 10 [Blood:10] Intake/Output this shift: No intake/output data recorded.  Recent Labs    01/02/18 0747  HGB 7.4*   Recent Labs    01/02/18 0747  WBC 8.7  RBC 3.07*  HCT 27.0*  PLT 217   Recent Labs    01/02/18 0747  NA 133*  K 4.7  CL 96*  CO2 24  BUN 62*  CREATININE 6.86*  GLUCOSE 218*  CALCIUM 7.7*   No results for input(s): LABPT, INR in the last 72 hours.  Incision: dressing C/D/I  Wound VAC in place     Assessment/Plan: 1 Day Post-Op Procedure(s) (LRB): LEFT BELOW KNEE AMPUTATION (Left)  Has orders for prn meds for BM.  Dialysis today Continue wound vac    GILBERT CLARK 01/03/2018, 8:09 AM

## 2018-01-03 NOTE — Evaluation (Signed)
Physical Therapy Evaluation Patient Details Name: Charles Vasquez MRN: [id number] DOB: 1935-03-31 Today's Date: 01/03/2018   History of Present Illness  Pt is a 82 yo male who presents with progressive gangrenous changes to the left foot.  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 PMH: R BKA, D+ESRD, DM, CAD, MI, CVA.   Clinical Impression  Patient is s/p above surgery resulting in functional limitations due to the deficits listed below (see PT Problem List). Pt limited in safe mobility by pain, and decreased strength and endurance. Pt currently requires modA for coming to EoB and for return to bed. Pt requires assist to maintain balance EoB. Patient will benefit from skilled PT to increase their independence and safety with mobility to allow discharge to the venue listed below.       Follow Up Recommendations SNF    Equipment Recommendations  None recommended by PT    Recommendations for Other Services OT consult     Precautions / Restrictions Precautions Precautions: Fall Precaution Comments: Wound vac Restrictions Weight Bearing Restrictions: Yes LLE Weight Bearing: Non weight bearing      Mobility  Bed Mobility Overal bed mobility: Needs Assistance Bed Mobility: Supine to Sit;Sit to Supine     Supine to sit: Mod assist Sit to supine: Mod assist   General bed mobility comments: modA for trunk to upright, able to bring LE to EoB, modA for management of trunk back into and square in bed  Transfers                 General transfer comment: unable to attempt due to pain         Balance Overall balance assessment: Needs assistance Sitting-balance support: Bilateral upper extremity supported Sitting balance-Leahy Scale: Poor Sitting balance - Comments: progressed from min A to min guard but requires B UE support to maintain  Postural control: Posterior lean                                   Pertinent Vitals/Pain Pain Assessment: 0-10 Pain Score: 9  Pain  Location: L BKA site Pain Descriptors / Indicators: Grimacing;Guarding;Operative site guarding Pain Intervention(s): Limited activity within patient's tolerance;Monitored during session;Premedicated before session;Repositioned    Home Living Family/patient expects to be discharged to:: Skilled nursing facility                      Prior Function Level of Independence: Needs assistance   Gait / Transfers Assistance Needed: uses W/c for mobility, requires assist to get to chair  ADL's / Homemaking Assistance Needed: assist for ADLs           Extremity/Trunk Assessment   Upper Extremity Assessment Upper Extremity Assessment: Defer to OT evaluation    Lower Extremity Assessment Lower Extremity Assessment: RLE deficits/detail;LLE deficits/detail RLE Deficits / Details: prior R BKA, hip flexion WNF, knee lacking full extension,  LLE Deficits / Details: S/p L BKA, hip flexion WNL, knee flex/ext limited by pain        Communication   Communication: HOH  Cognition Arousal/Alertness: Awake/alert Behavior During Therapy: WFL for tasks assessed/performed;Flat affect Overall Cognitive Status: History of cognitive impairments - at baseline                                 General Comments: significant other indicated that although he states he is independent  with mobility he requires assist from SNF staff      General Comments General comments (skin integrity, edema, etc.): significant other in room and assisted with hx and PLOF, Pt on 2 L O2 via [place] with SaO2 93%O2        Assessment/Plan    PT Assessment Patient needs continued PT services  PT Problem List Decreased strength;Decreased range of motion;Decreased activity tolerance;Decreased balance;Decreased mobility;Decreased cognition;Decreased coordination;Decreased safety awareness;Pain       PT Treatment Interventions DME instruction;Gait training;Functional mobility training;Therapeutic  activities;Therapeutic exercise;Balance training;Cognitive remediation;Patient/family education    PT Goals (Current goals can be found in the Care Plan section)  Acute Rehab PT Goals Patient Stated Goal: none stated PT Goal Formulation: With patient Time For Goal Achievement: 01/17/18 Potential to Achieve Goals: Fair    Frequency Min 2X/week   Barriers to discharge Decreased caregiver support         AM-PAC PT "6 Clicks" Daily Activity  Outcome Measure Difficulty turning over in bed (including adjusting bedclothes, sheets and blankets)?: Unable Difficulty moving from lying on back to sitting on the side of the bed? : Unable Difficulty sitting down on and standing up from a chair with arms (e.g., wheelchair, bedside commode, etc,.)?: Unable Help needed moving to and from a bed to chair (including a wheelchair)?: Total Help needed walking in hospital room?: Total Help needed climbing 3-5 steps with a railing? : Total 6 Click Score: 6    End of Session Equipment Utilized During Treatment: Gait belt;Oxygen Activity Tolerance: Patient limited by pain Patient left: in bed;with call bell/phone within reach;with bed alarm set;with family/visitor present Nurse Communication: Mobility status PT Visit Diagnosis: Other abnormalities of gait and mobility (R26.89);Muscle weakness (generalized) (M62.81);Difficulty in walking, not elsewhere classified (R26.2);Pain Pain - Right/Left: Left Pain - part of body: Leg    Time: 1610-9604 PT Time Calculation (min) (ACUTE ONLY): 20 min   Charges:   PT Evaluation $PT Eval Moderate Complexity: 1 Mod          Lavi Sheehan B. Beverely Risen PT, DPT Acute Rehabilitation Services Pager [phone] Office [phone]   [name] 01/03/2018, 4:41 PM

## 2018-01-03 NOTE — Plan of Care (Signed)

## 2018-01-03 NOTE — Progress Notes (Signed)
PT Cancellation Note  Patient Details Name: Charles Vasquez MRN: [id number] DOB: 08-12-1935   Cancelled Treatment:    Reason Eval/Treat Not Completed: (P) Patient at procedure or test/unavailable Pt is in HD this am. PT will follow back this afternoon for Evaluation as able.   Janet Decesare B. Beverely Risen PT, DPT Acute Rehabilitation Services Pager [phone] Office [phone]    [name] 01/03/2018, 9:44 AM

## 2018-01-04 LAB — GLUCOSE, CAPILLARY
GLUCOSE-CAPILLARY: 259 mg/dL — AB (ref 70–99)
GLUCOSE-CAPILLARY: 199 mg/dL — AB (ref 70–99)
GLUCOSE-CAPILLARY: 48 mg/dL — AB (ref 70–99)
Glucose-Capillary: 228 mg/dL — ABNORMAL HIGH (ref 70–99)
Glucose-Capillary: 229 mg/dL — ABNORMAL HIGH (ref 70–99)
Glucose-Capillary: 108 mg/dL — ABNORMAL HIGH (ref 70–99)

## 2018-01-04 MED ORDER — CHLORHEXIDINE GLUCONATE CLOTH 2 % EX PADS
6.0000 | MEDICATED_PAD | Freq: Every day | CUTANEOUS | Status: DC
  Administered 2018-01-04 – 2018-01-06 (×3): 6 via TOPICAL

## 2018-01-04 NOTE — Progress Notes (Addendum)
[facility] Progress Note   Subjective:   Seen and examined at bedside.  Feeling ok.  Pain well controlled.   Objective Vitals:   01/03/18 1130 01/03/18 1201 01/03/18 1314 01/03/18 1921  BP: 108/65 129/64 (!) 120/57 137/66  Pulse: 82 80 85 90  Resp:  18 16 16   Temp:  97.8 F (36.6 C)  98.3 F (36.8 C)  TempSrc:  Oral  Oral  SpO2: 99% 98% 91% 98%  Weight:  56.5 kg    Height:       Physical Exam General:NAD, chronically ill appearing elderly male Heart:RRR, no mrg Lungs:CTAB anteriorly. nml WOB Abdomen:soft, NTND Extremities:b/l BKA, L leg wrapped w/wound vac in place. No stump edema Dialysis Access: L thigh AVG +b/t   Filed Weights   01/02/18 0923 01/03/18 0842 01/03/18 1201  Weight: 57.2 kg 57.5 kg 56.5 kg    Intake/Output Summary (Last 24 hours) at 01/04/2018 0909 Last data filed at 01/04/2018 0600 Gross per 24 hour  Intake 1224 ml  Output 1000 ml  Net 224 ml    Additional Objective Labs: Basic Metabolic Panel: Recent Labs  Lab 01/02/18 0747 01/03/18 0916  NA 133* 132*  K 4.7 5.4*  CL 96* 96*  CO2 24 22  GLUCOSE 218* 258*  BUN 62* 76*  CREATININE 6.86* 8.15*  CALCIUM 7.7* 7.6*  PHOS  --  3.8   Liver Function Tests: Recent Labs  Lab 01/03/18 0916  ALBUMIN 2.4*   CBC: Recent Labs  Lab 01/02/18 0747 01/03/18 0916  WBC 8.7 11.6*  HGB 7.4* 8.3*  HCT 27.0* 28.1*  MCV 87.9 87.3  PLT 217 213    CBG: Recent Labs  Lab 01/03/18 1312 01/03/18 1627 01/03/18 1800 01/03/18 2110 01/04/18 0641  GLUCAP 111* 208* 266* 177* 259*   Studies/Results: No results found.  Medications: . sodium chloride 10 mL/hr at 01/02/18 1410  . methocarbamol (ROBAXIN) IV     . amitriptyline  25 mg Oral Q2000  . aspirin EC  325 mg Oral Daily  . Chlorhexidine Gluconate Cloth  6 each Topical Q0600  . darbepoetin (ARANESP) injection - DIALYSIS  150 mcg Intravenous Q Sat-HD  . docusate sodium  100 mg Oral BID  . [START ON 01/05/2018]  doxercalciferol  2 mcg Intravenous Q M,W,F-HD  . feeding supplement (NEPRO CARB STEADY)  237 mL Oral BID  . insulin aspart  0-9 Units Subcutaneous TID WC  . insulin aspart  3 Units Subcutaneous TID WC  . insulin glargine  8 Units Subcutaneous Q M,W,F-2000  . losartan  50 mg Oral Daily  . multivitamin  1 tablet Oral Daily  . pantoprazole  40 mg Oral BID WC  . sevelamer carbonate  1,600 mg Oral TID WC    Dialysis Orders: MWF  - [name]  3.25hrs, EDW 56.5kg, 2K/ 2.5Ca  Access: L thigh AVG  Heparin 2000 load and 500 qhr EPO 14000 qHD   Assessment/Plan: 1.  Gangrenous L foot - s/p L BKA Dr. Lajoyce Corners 10/25.  Pain well controlled. Per ortho. 2.  ESRD -  On HD MWF.  K 5.4 pre HD yesterday. No new labs today. HD off schedule yesterday due to surgery.  Orders written for HD tomorrow per regular schedule. 3.  Hypertension/volume  - BP eell controlled.  Does not appear volume overloaded on exam.  Left HD yesterday and pre surgery EDW.  Will need lower edw at d/c.  Titrate down as tolerated.  4.  Anemia of CKD - Hgb  8.4, s/p 1Unit pRBC.  Aranesp given yesterday. 5.  Secondary Hyperparathyroidism - Ca and phos at goal.  6.  Nutrition - Alb 2.4. Renal diet w/fluid restrictions once advanced. Nepro. Renavite.   Virgina Norfolk, PA-C [facility] Pager: [phone] 01/04/2018,9:09 AM  LOS: 2 days   I have seen and examined this patient and agree with plan and assessment in the above note with renal recommendations/intervention highlighted.  Continue with HD qMWF and re-evaluate edw.  Jomarie Longs A Marlissa Emerick,MD 01/04/2018 12:19 PM

## 2018-01-04 NOTE — Progress Notes (Signed)
Hypoglycemic Event  CBG: 48  Treatment: peanut butter and OJ   Symptoms: asymptomatic  Follow-up CBG: Time:2220 CBG Result:108    [employer]

## 2018-01-04 NOTE — Progress Notes (Signed)
Subjective: 2 Days Post-Op Procedure(s) (LRB): LEFT BELOW KNEE AMPUTATION (Left) Patient reports pain as moderate.   Awake and alert.  Objective: Vital signs in last 24 hours: Temp:  [97.8 F (36.6 C)-98.3 F (36.8 C)] 98.2 F (36.8 C) (10/27 0940) Pulse Rate:  [80-90] 88 (10/27 0940) Resp:  [16-18] 16 (10/27 0940) BP: (108-137)/(56-66) 124/56 (10/27 0940) SpO2:  [91 %-99 %] 96 % (10/27 0940) Weight:  [56.5 kg] 56.5 kg (10/26 1201)  Intake/Output from previous day: 10/26 0701 - 10/27 0700 In: 1224 [P.O.:597; I.V.:390; NG/GT:237] Out: 1000  Intake/Output this shift: No intake/output data recorded.  Recent Labs    01/02/18 0747 01/03/18 0916  HGB 7.4* 8.3*   Recent Labs    01/02/18 0747 01/03/18 0916  WBC 8.7 11.6*  RBC 3.07* 3.22*  HCT 27.0* 28.1*  PLT 217 213   Recent Labs    01/02/18 0747 01/03/18 0916  NA 133* 132*  K 4.7 5.4*  CL 96* 96*  CO2 24 22  BUN 62* 76*  CREATININE 6.86* 8.15*  GLUCOSE 218* 258*  CALCIUM 7.7* 7.6*   No results for input(s): LABPT, INR in the last 72 hours.  Compartment soft  Wound vac in place     Assessment/Plan: 2 Days Post-Op Procedure(s) (LRB): LEFT BELOW KNEE AMPUTATION (Left) Continue wound vac Dr. Lajoyce Corners to see patient tomorrow.     Charles Vasquez 01/04/2018, 11:04 AM

## 2018-01-04 NOTE — Plan of Care (Signed)

## 2018-01-04 NOTE — Progress Notes (Signed)
Paged on call MD regarding pt's loose stools and also to make aware of episode of low CBG that is now back to normal. Will continue to monitor pt.

## 2018-01-04 NOTE — Plan of Care (Signed)
  Problem: Health Behavior/Discharge Planning: Goal: Ability to manage health-related needs will improve Outcome: Progressing   Problem: Clinical Measurements: Goal: Respiratory complications will improve Outcome: Progressing   Problem: Activity: Goal: Risk for activity intolerance will decrease Outcome: Progressing   Problem: Safety: Goal: Ability to remain free from injury will improve Outcome: Progressing   Problem: Skin Integrity: Goal: Risk for impaired skin integrity will decrease Outcome: Progressing

## 2018-01-05 ENCOUNTER — Encounter (HOSPITAL_COMMUNITY): Payer: Self-pay | Admitting: Orthopedic Surgery

## 2018-01-05 LAB — RENAL FUNCTION PANEL
Albumin: 2 g/dL — ABNORMAL LOW (ref 3.5–5.0)
Anion gap: 11 (ref 5–15)
BUN: 64 mg/dL — AB (ref 8–23)
CALCIUM: 7.6 mg/dL — AB (ref 8.9–10.3)
CHLORIDE: 101 mmol/L (ref 98–111)
CO2: 24 mmol/L (ref 22–32)
CREATININE: 7.91 mg/dL — AB (ref 0.61–1.24)
GFR calc Af Amer: 7 mL/min — ABNORMAL LOW (ref >60)
GFR calc non Af Amer: 6 mL/min — ABNORMAL LOW (ref >60)
GLUCOSE: 169 mg/dL — AB (ref 70–99)
Phosphorus: 3.6 mg/dL (ref 2.5–4.6)
Potassium: 4.2 mmol/L (ref 3.5–5.1)
SODIUM: 136 mmol/L (ref 135–145)

## 2018-01-05 LAB — CBC
HCT: 30.3 % — ABNORMAL LOW (ref 39.0–52.0)
Hemoglobin: 8.8 g/dL — ABNORMAL LOW (ref 13.0–17.0)
MCH: 25.3 pg — AB (ref 26.0–34.0)
MCHC: 29 g/dL — AB (ref 30.0–36.0)
MCV: 87.1 fL (ref 80.0–100.0)
PLATELETS: 221 10*3/uL (ref 150–400)
RBC: 3.48 MIL/uL — AB (ref 4.22–5.81)
RDW: 17 % — AB (ref 11.5–15.5)
WBC: 10 10*3/uL (ref 4.0–10.5)
nRBC: 0 % (ref 0.0–0.2)

## 2018-01-05 LAB — GLUCOSE, CAPILLARY
GLUCOSE-CAPILLARY: 179 mg/dL — AB (ref 70–99)
Glucose-Capillary: 181 mg/dL — ABNORMAL HIGH (ref 70–99)
Glucose-Capillary: 117 mg/dL — ABNORMAL HIGH (ref 70–99)
Glucose-Capillary: 228 mg/dL — ABNORMAL HIGH (ref 70–99)

## 2018-01-05 MED ORDER — SODIUM CHLORIDE 0.9 % IV SOLN: 100.0000 mL | INTRAVENOUS | Status: DC | PRN

## 2018-01-05 MED ORDER — PENTAFLUOROPROP-TETRAFLUOROETH EX AERO: 1.0000 "application " | INHALATION_SPRAY | CUTANEOUS | Status: DC | PRN

## 2018-01-05 MED ORDER — LIDOCAINE-PRILOCAINE 2.5-2.5 % EX CREA: 1.0000 "application " | TOPICAL_CREAM | CUTANEOUS | Status: DC | PRN

## 2018-01-05 MED ORDER — HEPARIN SODIUM (PORCINE) 1000 UNIT/ML DIALYSIS: 1000.0000 [IU] | INTRAMUSCULAR | Status: DC | PRN

## 2018-01-05 MED ORDER — ALTEPLASE 2 MG IJ SOLR: 2.0000 mg | Freq: Once | INTRAMUSCULAR | Status: DC | PRN

## 2018-01-05 MED ORDER — LIDOCAINE HCL (PF) 1 % IJ SOLN: 5.0000 mL | INTRAMUSCULAR | Status: DC | PRN

## 2018-01-05 NOTE — Plan of Care (Signed)

## 2018-01-05 NOTE — Progress Notes (Signed)
Patient ID: Charles Vasquez, male   DOB: [birth year], 82 y.o.   MRN: [id number] Patient without complaints this am, no drainage in vac canister, plan for d/c to snf

## 2018-01-05 NOTE — Progress Notes (Signed)
  [facility] Progress Note    Assessment/ Plan:   Dialysis Orders: MWF - [name] 3.25hrs, [id number]  Access:L thigh AVG Heparin2000 load and 500 qhr EPO 14000qHD  Assessment/Plan: 1. Gangrenous L foot - s/p L BKA Dr. Lajoyce Corners 10/25.  Pain well controlled. Per ortho. 2. ESRD - On HD MWF.  HD off schedule but he's on for today to resume  regular schedule. 3. Hypertension/volume - BP well controlled. Does not appear volume overloaded on exam.   Will need lower edw at d/c.  Titrate down as tolerated.  4. Anemia of CKD - Hgb 8.4, s/p 1Unit pRBC. Aranesp given 10/26. 5. Secondary Hyperparathyroidism - Ca and phos at goal.  6. Nutrition - Alb 2.4. Renal diet w/fluid restrictions once advanced. Nepro. Renavite.  Subjective:   Pain well controlled Denies f/c/n/v/dyspnea  Family member is bedside.   Objective:   BP 136/62 (BP Location: Left Arm)   Pulse 85   Temp 98.8 F (37.1 C) (Oral)   Resp 15   Ht 5\' 7"  (1.702 m)   Wt 56.5 kg   SpO2 94%   BMI 19.51 kg/m   Intake/Output Summary (Last 24 hours) at 01/05/2018 1004 Last data filed at 01/04/2018 2240 Gross per 24 hour  Intake 380 ml  Output 0 ml  Net 380 ml   Weight change:   Physical Exam: General:NAD, chronically ill appearing elderly male Heart:RRR, no mrg Lungs:CTAB anteriorly. nml WOB Abdomen:soft, NTND Extremities:b/l BKA, L leg wrapped w/wound vac in place. No stump edema Dialysis Access: L thigh AVG +b/t   Imaging: No results found.  Labs: BMET Recent Labs  Lab 01/02/18 0747 01/03/18 0916  NA 133* 132*  K 4.7 5.4*  CL 96* 96*  CO2 24 22  GLUCOSE 218* 258*  BUN 62* 76*  CREATININE 6.86* 8.15*  CALCIUM 7.7* 7.6*  PHOS  --  3.8   CBC Recent Labs  Lab 01/02/18 0747 01/03/18 0916 01/05/18 0114  WBC 8.7 11.6* 10.0  HGB 7.4* 8.3* 8.8*  HCT 27.0* 28.1* 30.3*  MCV 87.9 87.3 87.1  PLT 217 213 221    Medications:    . amitriptyline  25  mg Oral Q2000  . aspirin EC  325 mg Oral Daily  . Chlorhexidine Gluconate Cloth  6 each Topical Q0600  . Chlorhexidine Gluconate Cloth  6 each Topical Q0600  . darbepoetin (ARANESP) injection - DIALYSIS  150 mcg Intravenous Q Sat-HD  . docusate sodium  100 mg Oral BID  . doxercalciferol  2 mcg Intravenous Q M,W,F-HD  . feeding supplement (NEPRO CARB STEADY)  237 mL Oral BID  . insulin aspart  0-9 Units Subcutaneous TID WC  . insulin aspart  3 Units Subcutaneous TID WC  . insulin glargine  8 Units Subcutaneous Q M,W,F-2000  . losartan  50 mg Oral Daily  . multivitamin  1 tablet Oral Daily  . pantoprazole  40 mg Oral BID WC  . sevelamer carbonate  1,600 mg Oral TID WC      Paulene Floor, MD 01/05/2018, 10:04 AM

## 2018-01-05 NOTE — Progress Notes (Signed)
Received Pt on bed post HD, vital signs, CBG taken and recorded, not in distress, ordered dinner for Pt but Pt refused to eat, stated he is not hungry. Will endorse accordingly.

## 2018-01-05 NOTE — Progress Notes (Signed)
Physical Therapy Treatment Patient Details Name: Charles Vasquez MRN: [id number] DOB: May 23, 1935 Today's Date: 01/05/2018    History of Present Illness Pt is a 82 yo male who presents with progressive gangrenous changes to the left foot.  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 PMH: R BKA, D+ESRD, DM, CAD, MI, CVA.     PT Comments    Continuing work on functional mobility and activity tolerance;  Session focused on therapeutic exercise and initiating training for functional transfers; Worked sitting EOB on weight shifts and scoots -- currently requiring max assist; Noting improved sitting balance over PT eval; continue to recommend SNF for post-acute rehabilitation.   Follow Up Recommendations  SNF     Equipment Recommendations  None recommended by PT    Recommendations for Other Services       Precautions / Restrictions Precautions Precautions: Fall Precaution Comments: Wound vac Restrictions Weight Bearing Restrictions: Yes LLE Weight Bearing: Non weight bearing    Mobility  Bed Mobility Overal bed mobility: Needs Assistance Bed Mobility: Rolling;Sidelying to Sit;Sit to Supine Rolling: Min guard Sidelying to sit: Mod assist   Sit to supine: Min assist   General bed mobility comments: Rolls well to both sides with use of bedrail to pull; Mod assist to elevate trunk sidelying to sit; to lay back down, Charles Vasquez went to L elbow prop, then extended trunk and "rolled" to supine  Transfers Overall transfer level: Needs assistance   Transfers: Lateral/Scoot Transfers;Anterior-Posterior Radio producer transfers: Max assist  Lateral/Scoot Transfers: Max Engineer, petroleum transfer comment: Simulated ant-post and lateral scoots sitting up in bed; good attempts at weight shifting, and held bedrail to help with lateral scoot, but unable to shift/scoot/move without physical assistand use of bed pad; Worked on elbow propping to facilitate weight shifts for reciprocally scooting  backwards in prep for trying ant-post transfer  Ambulation/Gait                 Stairs             Wheelchair Mobility    Modified Rankin (Stroke Patients Only)       Balance                                            Cognition Arousal/Alertness: Awake/alert(but extremely sleepy) Behavior During Therapy: WFL for tasks assessed/performed;Flat affect Overall Cognitive Status: History of cognitive impairments - at baseline                                 General Comments: Per PT eval, significant other indicated that although he states he is independent with mobility he requires assist from [facility] staff      Exercises Amputee Exercises Quad Sets: AROM;Right;Left;10 reps(tactile cues provided) Gluteal Sets: AROM;Right;Left;10 reps(bolster under bilateral knees) Hip Extension: AROM;Right;Left;10 reps;Sidelying Hip ABduction/ADduction: AROM;Right;Left;10 reps;Sidelying Straight Leg Raises: AROM;Both;10 reps    General Comments        Pertinent Vitals/Pain Pain Assessment: Faces Faces Pain Scale: Hurts little more Pain Location: L BKA site Pain Descriptors / Indicators: Grimacing;Guarding;Operative site guarding Pain Intervention(s): Monitored during session    Home Living                      Prior Function  PT Goals (current goals can now be found in the care plan section) Acute Rehab PT Goals Patient Stated Goal: none stated PT Goal Formulation: With patient Time For Goal Achievement: 01/17/18 Potential to Achieve Goals: Fair Progress towards PT goals: Progressing toward goals(slowly)    Frequency    Min 2X/week      PT Plan Current plan remains appropriate    Co-evaluation              AM-PAC PT "6 Clicks" Daily Activity  Outcome Measure  Difficulty turning over in bed (including adjusting bedclothes, sheets and blankets)?: A Little Difficulty moving from lying on back to  sitting on the side of the bed? : Unable Difficulty sitting down on and standing up from a chair with arms (e.g., wheelchair, bedside commode, etc,.)?: Unable Help needed moving to and from a bed to chair (including a wheelchair)?: Total Help needed walking in hospital room?: Total Help needed climbing 3-5 steps with a railing? : Total 6 Click Score: 8    End of Session   Activity Tolerance: Patient tolerated treatment well;Other (comment)(but very sleepy) Patient left: in bed;with call bell/phone within reach;with bed alarm set;with family/visitor present Nurse Communication: Mobility status PT Visit Diagnosis: Other abnormalities of gait and mobility (R26.89);Muscle weakness (generalized) (M62.81);Difficulty in walking, not elsewhere classified (R26.2);Pain Pain - Right/Left: Left Pain - part of body: Leg     Time: 2025-4270 PT Time Calculation (min) (ACUTE ONLY): 26 min  Charges:  $Therapeutic Exercise: 8-22 mins $Therapeutic Activity: 8-22 mins                     Van Clines, PT  Acute Rehabilitation Services Pager [phone] Office [phone]    Levi Aland 01/05/2018, 12:41 PM

## 2018-01-05 NOTE — Progress Notes (Signed)
Inpatient Diabetes Program Recommendations  AACE/ADA: New Consensus Statement on Inpatient Glycemic Control (2015)  Target Ranges:  Prepandial:   less than 140 mg/dL      Peak postprandial:   less than 180 mg/dL (1-2 hours)      Critically ill patients:  140 - 180 mg/dL   Lab Results  Component Value Date   GLUCAP 179 (H) 01/05/2018   HGBA1C 6.6 (H) 10/08/2017    Review of Glycemic Control Results for JAMEL, WINKER (MRN [id number]) as of 01/05/2018 10:30  Ref. Range 01/04/2018 22:02 01/04/2018 22:34 01/05/2018 06:37  Glucose-Capillary Latest Ref Range: 70 - 99 mg/dL 48 (L) 440 (H) 102 (H)   Diabetes history: Type 2 DM Outpatient Diabetes medications: Lantus 8 units QM-W-F QHS, Humalog 0-8 units TID Current orders for Inpatient glycemic control: Lantus 8 units QM-W-F QHS, Novolog 3 units TID, Novolog 0-9 units TID  Inpatient Diabetes Program Recommendations:    Noted patient experienced hypoglycemic event of 48 mg/dL. Patient received 8 units total within 3 hours. Patient missed several doses on 10/27 of meal coverage due to "poor PO intake", however, 100% of the meal was documented. Appears that noon correction dose was given 2 hours late, thus contributing to hypo. With the addition of Lantus tonight, will continue to watch trends.   Thanks, Lujean Rave, MSN, RNC-OB Diabetes Coordinator [phone] (8a-5p)

## 2018-01-06 LAB — C DIFFICILE QUICK SCREEN W PCR REFLEX
C DIFFICILE (CDIFF) INTERP: DETECTED
C DIFFICILE (CDIFF) TOXIN: POSITIVE — AB
C DIFFICLE (CDIFF) ANTIGEN: POSITIVE — AB

## 2018-01-06 LAB — GLUCOSE, CAPILLARY
GLUCOSE-CAPILLARY: 172 mg/dL — AB (ref 70–99)
GLUCOSE-CAPILLARY: 223 mg/dL — AB (ref 70–99)
GLUCOSE-CAPILLARY: 23 mg/dL — AB (ref 70–99)
Glucose-Capillary: 28 mg/dL — CL (ref 70–99)
Glucose-Capillary: 53 mg/dL — ABNORMAL LOW (ref 70–99)
Glucose-Capillary: 86 mg/dL (ref 70–99)
Glucose-Capillary: 255 mg/dL — ABNORMAL HIGH (ref 70–99)
Glucose-Capillary: 271 mg/dL — ABNORMAL HIGH (ref 70–99)
Glucose-Capillary: 352 mg/dL — ABNORMAL HIGH (ref 70–99)

## 2018-01-06 LAB — GLUCOSE, RANDOM: Glucose, Bld: 341 mg/dL — ABNORMAL HIGH (ref 70–99)

## 2018-01-06 MED ORDER — GLUCOSE 40 % PO GEL
ORAL | Status: AC
  Administered 2018-01-06: 37.5 g
  Filled 2018-01-06: qty 1

## 2018-01-06 MED ORDER — DEXTROSE 50 % IV SOLN
INTRAVENOUS | Status: AC
  Administered 2018-01-06: 12:00:00
  Filled 2018-01-06: qty 50

## 2018-01-06 MED ORDER — HYDROCODONE-ACETAMINOPHEN 5-325 MG PO TABS: 1.0000 | ORAL_TABLET | ORAL | 0 refills | Status: DC | PRN

## 2018-01-06 MED ORDER — GLUCAGON HCL RDNA (DIAGNOSTIC) 1 MG IJ SOLR
INTRAMUSCULAR | Status: AC
  Filled 2018-01-06: qty 1

## 2018-01-06 MED ORDER — GLUCOSE 40 % PO GEL
ORAL | Status: AC
  Filled 2018-01-06: qty 1

## 2018-01-06 MED ORDER — GLUCAGON HCL RDNA (DIAGNOSTIC) 1 MG IJ SOLR
INTRAMUSCULAR | Status: AC
  Administered 2018-01-06: 1 mg
  Filled 2018-01-06: qty 1

## 2018-01-06 NOTE — Significant Event (Signed)
Rapid Response Event Note  Overview:  Called to assist with patient with hypoglycemia Time Called: 1140 Arrival Time: 1141 Event Type: Other (Comment)(hypogylcemic)  Initial Focused Assessment:  Sitting in chair - cold clammy unresponsive resps shallow - good pulse - RN's at bedside - has CBG check of 15 then 23 on repeat - patient without IV access.  Dr Lajoyce Corners has been called.     Interventions: Stat IM injection of 1 mg Glucagon.  Lifted back to bed.  #22 angiocath  inserted right hand emergently for IV access - patient has very limited veins. Repeat CBG 53.  Stat D50W IV given per protocol.  Patient responding some - will open eyes but still somulent.   Repeat CBG 86.  Patient still very sleepy - will answer yes and no and states he is in hospital but very weak.  1/2 amp D50W IV given.  BP and HR stable - please see flowsheet for details. Looked for IV line in left arm - unable to access -  IV team called for line start in left arm so we can take out IV in right hand.  Skin now cool but dry - follows simple commands but states he is weak and remains sleepy.  RN states he is usually minimally vocal.   Dr. Lajoyce Corners updated by Carlyon Shadow.  Will send serum glucose when IV started. Repeat CBG 223.  Handoff to [employer].    Plan of Care (if not transferred):  IV team to start new PIV left arm and d/c right hand PIV started in emergency situation.  RN to follow CBG closely over next few hours.  Serum glucose draw - patient with severe vascular disease in peripheral.  To call RRT as needed.    Event Summary: Name of Physician Notified: Dr. Lajoyce Corners at (pta RRT)    at    Outcome: Stayed in room and stabalized     [place], [place] L

## 2018-01-06 NOTE — Discharge Summary (Signed)
Discharge Diagnoses:  Active Problems:   Gangrene of left foot (HCC)   Left below-knee amputee [facility])   Surgeries: Procedure(s): LEFT BELOW KNEE AMPUTATION on 01/02/2018    Consultants: Treatment Team:  Terrial Rhodes, MD  Discharged Condition: Improved  Hospital Course: Charles Vasquez is an 82 y.o. male who was admitted 01/02/2018 with a chief complaint of gangrene left foot, with a final diagnosis of Gangrene Left Foot.  Patient was brought to the operating room on 01/02/2018 and underwent Procedure(s): LEFT BELOW KNEE AMPUTATION.    Patient was given perioperative antibiotics:  Anti-infectives (From admission, onward)   Start     Dose/Rate Route Frequency Ordered Stop   01/02/18 1415  ceFAZolin (ANCEF) IVPB 1 g/50 mL premix     1 g 100 mL/hr over 30 Minutes Intravenous Every 12 hours 01/02/18 1400 01/02/18 2109   01/02/18 0700  ceFAZolin (ANCEF) IVPB 2g/100 mL premix     2 g 200 mL/hr over 30 Minutes Intravenous On call to O.R. 01/02/18 1610 01/02/18 1027    .  Patient was given sequential compression devices, early ambulation, and aspirin for DVT prophylaxis.  Recent vital signs:  Patient Vitals for the past 24 hrs:  BP Temp Temp src Pulse Resp SpO2 Weight  01/06/18 0600 (!) 126/58 - - 83 18 97 % -  01/05/18 2133 (!) 136/58 97.7 F (36.5 C) Oral 78 - 100 % -  01/05/18 1721 (!) 126/57 97.8 F (36.6 C) Oral 83 16 99 % -  01/05/18 1626 (!) 125/49 98.3 F (36.8 C) Oral 86 16 99 % 55.2 kg  01/05/18 1600 (!) 91/53 - - 85 - - -  01/05/18 1530 (!) 97/57 - - 86 - - -  01/05/18 1500 105/65 - - 87 - - -  01/05/18 1430 93/67 - - 88 - - -  01/05/18 1400 96/75 - - 88 - - -  01/05/18 1330 (!) 116/59 - - 85 - - -  01/05/18 1308 130/62 - - 86 - - -  01/05/18 1256 137/65 98.4 F (36.9 C) Oral 87 - 96 % 57.2 kg  01/05/18 0917 136/62 98.8 F (37.1 C) Oral 85 15 94 % -  .  Recent laboratory studies: No results found.  Discharge Medications:   Allergies as of 01/06/2018       Reactions   Lisinopril Other (See Comments)   HYPERKALEMIA RENAL DYSFUNCTION PROGRESSION      Medication List    STOP taking these medications   collagenase ointment Commonly known as:  SANTYL   traMADol 50 MG tablet Commonly known as:  ULTRAM     TAKE these medications   acetaminophen 650 MG CR tablet Commonly known as:  TYLENOL Take 650 mg by mouth every 6 (six) hours as needed for pain.   amitriptyline 25 MG tablet Commonly known as:  ELAVIL Take 25 mg by mouth daily at 8 pm. (2000)   Darbepoetin Alfa 200 MCG/0.4ML Sosy injection Commonly known as:  ARANESP Inject 0.4 mLs (200 mcg total) into the vein every Monday with hemodialysis.   diclofenac sodium 1 % Gel Commonly known as:  VOLTAREN Apply 2 g topically 4 (four) times daily.   dicyclomine 10 MG capsule Commonly known as:  BENTYL 1 PO 30 MINUTES PRIOR TO BREAKFAST AND LUNCH What changed:    how much to take  how to take this  when to take this  additional instructions   doxercalciferol 4 MCG/2ML injection Commonly known as:  HECTOROL Inject  1 mL (2 mcg total) into the vein every Monday, Wednesday, and Friday with hemodialysis.   feeding supplement (NEPRO CARB STEADY) Liqd Take 237 mLs by mouth 2 (two) times daily.   GLUCAGON EMERGENCY 1 MG injection Generic drug:  glucagon Inject 1 mg into the vein daily as needed (hypoglycemia).   HYDROcodone-acetaminophen 5-325 MG tablet Commonly known as:  NORCO/VICODIN Take 1 tablet by mouth every 4 (four) hours as needed for moderate pain. What changed:    when to take this  reasons to take this   insulin glargine 100 UNIT/ML injection Commonly known as:  LANTUS Inject 0.08 mLs (8 Units total) into the skin every Monday, Wednesday, and Friday at 8 PM.   insulin lispro 100 UNIT/ML injection Commonly known as:  HUMALOG Inject 0-8 Units into the skin 3 (three) times daily before meals. Blood sugar of 70-100=0 units 101-250=1 unit 151-200=2  units 201-250=4 units  251-300=6 units 301-350= 8 units Greater than 350= 10 units **Call MD if glucose greater than 450**   ipratropium-albuterol 0.5-2.5 (3) MG/3ML Soln Commonly known as:  DUONEB Take 3 mLs by nebulization every 6 (six) hours as needed (shortness of breath).   iron polysaccharides 150 MG capsule Commonly known as:  NIFEREX Take 1 capsule (150 mg total) by mouth 2 (two) times daily. What changed:  additional instructions   lidocaine 5 % Commonly known as:  LIDODERM Place 1 patch onto the skin daily. Remove & Discard patch within 12 hours or as directed by MD   losartan 50 MG tablet Commonly known as:  COZAAR Take 50 mg by mouth daily.   multivitamin Tabs tablet Take 1 tablet by mouth daily.   pantoprazole 40 MG tablet Commonly known as:  PROTONIX Take 1 tablet (40 mg total) by mouth 2 (two) times daily with a meal. What changed:  additional instructions   pentoxifylline 400 MG CR tablet Commonly known as:  TRENTAL Take 1 tablet (400 mg total) by mouth 3 (three) times daily with meals.   Polyethylene Glycol 1000 Powd Take 17 g by mouth daily as needed (constipation).   senna 8.6 MG tablet Commonly known as:  SENOKOT Take 2 tablets by mouth daily as needed for constipation.   sevelamer carbonate 800 MG tablet Commonly known as:  RENVELA Take 2 tablets (1,600 mg total) by mouth 3 (three) times daily with meals. What changed:  additional instructions            Discharge Care Instructions  (From admission, onward)         Start     Ordered   01/06/18 0000  Non weight bearing    Question Answer Comment  Laterality left   Extremity Lower      01/06/18 0732          Diagnostic Studies: No results found.  Patient benefited maximally from their hospital stay and there were no complications.     Disposition: Discharge disposition: 03-Skilled Nursing Facility      Discharge Instructions    Call MD / Call 911   Complete by:  As  directed    If you experience chest pain or shortness of breath, CALL 911 and be transported to the hospital emergency room.  If you develope a fever above 101 F, pus (white drainage) or increased drainage or redness at the wound, or calf pain, call your surgeon's office.   Constipation Prevention   Complete by:  As directed    Drink plenty of fluids.  Prune  juice may be helpful.  You may use a stool softener, such as Colace (over the counter) 100 mg twice a day.  Use MiraLax (over the counter) for constipation as needed.   Diet - low sodium heart healthy   Complete by:  As directed    Increase activity slowly as tolerated   Complete by:  As directed    Negative Pressure Wound Therapy - Incisional   Complete by:  As directed    Continue portable prevena wound vac for 1 week   Non weight bearing   Complete by:  As directed    Laterality:  left   Extremity:  Lower     Follow-up Information    Nadara Mustard, MD In 1 week.   Specialty:  Orthopedic Surgery Contact information: [address] [place] [address] [phone]            Signed: Nadara Mustard 01/06/2018, 7:32 AM

## 2018-01-06 NOTE — Progress Notes (Signed)
AJ, NT checked pt blood sugar and it was 15, I asked AJ to recheck the patients sugar and the result was 23. RN tried to arouse that patient but it was very hard. RN called charge nurse and she pulled oral glucose and juice for the patient, pt swallowed the oral glucose but his sugar stayed at 23, and was becoming more difficult to arouse. RN called Rapid Response Nurse Debbie to come and help [place] with the patient. MD was notfied at the beginning of the event and at the end

## 2018-01-06 NOTE — Progress Notes (Signed)
  Charles Vasquez Progress Note    Assessment/ Plan:   Dialysis Orders: MWF - [name] 3.25hrs, [id number]  Access:L thigh AVG Heparin2000 load and 500 qhr EPO 14000qHD  Assessment/Plan: 1. Gangrenous L foot - s/p L BKA Dr. OLMB86/75. Pain controlled on the whole but pt c/o pain this AM. Per ortho. 2. ESRD - On HD MWF.  Last Hd Mon. - Next HD Wed. 3. Hypertension/volume - BPwell controlled.Does not appear volume overloaded on exam. Will need lower edw at d/c. Titrate down as tolerated.  4. Anemia of CKD - Hgb8.4, s/p 1Unit pRBC. Aranesp 10/26. 5. Secondary Hyperparathyroidism -Ca and phos at goal. 6. Nutrition -Alb 2.4. Renal diet w/fluid restrictions once advanced. Nepro. Renavite.  Subjective:   C/O pain in the left BKA Denies f/c/n/v/dyspnea   Objective:   BP (!) 126/58 (BP Location: Left Arm)   Pulse 83   Temp 97.7 F (36.5 C) (Oral)   Resp 18   Ht 5\' 7"  (1.702 m)   Wt 55.2 kg   SpO2 97%   BMI 19.06 kg/m   Intake/Output Summary (Last 24 hours) at 01/06/2018 1003 Last data filed at 01/06/2018 0834 Gross per 24 hour  Intake 302 ml  Output 2000 ml  Net -1698 ml   Weight change:   Physical Exam: General:NAD, chronically ill appearing elderly male Heart:RRR, no mrg Lungs:CTAB anteriorly. nml WOB Abdomen:soft, NTND Extremities:b/l BKA, L leg w/ wound vac in place. No stump edema Dialysis Access:L thigh AVG +thrill  Imaging: No results found.  Labs: BMET Recent Labs  Lab 01/02/18 0747 01/03/18 0916 01/05/18 1304  NA 133* 132* 136  K 4.7 5.4* 4.2  CL 96* 96* 101  CO2 24 22 24   GLUCOSE 218* 258* 169*  BUN 62* 76* 64*  CREATININE 6.86* 8.15* 7.91*  CALCIUM 7.7* 7.6* 7.6*  PHOS  --  3.8 3.6   CBC Recent Labs  Lab 01/02/18 0747 01/03/18 0916 01/05/18 0114  WBC 8.7 11.6* 10.0  HGB 7.4* 8.3* 8.8*  HCT 27.0* 28.1* 30.3*  MCV 87.9 87.3 87.1  PLT 217 213 221    Medications:     . amitriptyline  25 mg Oral Q2000  . aspirin EC  325 mg Oral Daily  . Chlorhexidine Gluconate Cloth  6 each Topical Q0600  . Chlorhexidine Gluconate Cloth  6 each Topical Q0600  . darbepoetin (ARANESP) injection - DIALYSIS  150 mcg Intravenous Q Sat-HD  . docusate sodium  100 mg Oral BID  . doxercalciferol  2 mcg Intravenous Q M,W,F-HD  . feeding supplement (NEPRO CARB STEADY)  237 mL Oral BID  . insulin aspart  0-9 Units Subcutaneous TID WC  . insulin aspart  3 Units Subcutaneous TID WC  . insulin glargine  8 Units Subcutaneous Q M,W,F-2000  . losartan  50 mg Oral Daily  . multivitamin  1 tablet Oral Daily  . pantoprazole  40 mg Oral BID WC  . sevelamer carbonate  1,600 mg Oral TID WC      Paulene Floor, MD 01/06/2018, 10:03 AM

## 2018-01-06 NOTE — Clinical Social Work Note (Signed)
Clinical Social Work Assessment  Patient Details  Name: Charles Vasquez MRN: [id number] Date of Birth: Jun 07, 1935  Date of referral:  01/04/18               Reason for consult:  Facility Placement, Discharge Planning                Permission sought to share information with:  Facility Industrial/product designer granted to share information::  No  Name::     Optician, dispensing::  SNFs  Relationship::  sister  Contact Information:  [phone]  Housing/Transportation Living arrangements for the past 2 months:  Single Family Home Source of Information:  Patient, Siblings Patient Interpreter Needed:  None Criminal Activity/Legal Involvement Pertinent to Current Situation/Hospitalization:  No - Comment as needed Significant Relationships:  Friend, Siblings Lives with:  Self Do you feel safe going back to the place where you live?  No Need for family participation in patient care:  Yes (Comment)  Care giving concerns:  CSW received referral for possible SNF placement at time of discharge. Spoke with patient's sister Charles Vasquez regarding possibility of SNF placement . Patient's sister   is currently unable to care for him at their home given patient's current needs and fall risk.  Patient and sister expressed understanding of PT recommendation and are agreeable to SNF placement at time of discharge. CSW to continue to follow and assist with discharge planning needs.     Social Worker assessment / plan:  Spoke with patient and patient's sister Charles Vasquez concerning possibility of rehab at [facility] before returning home.    Employment status:  Retired Database administrator PT Recommendations:  Skilled Nursing Facility Information / Referral to community resources:  Skilled Nursing Facility  Patient/Family's Response to care:  Patient and  Sister Charles Vasquez recognize need for rehab before returning home and are agreeable to a SNF in [place]. They report preference for  [facility]      . CSW explained insurance authorization process. Patient's family reported that they want patient to get stronger to be able to come back home.    Patient/Family's Understanding of and Emotional Response to Diagnosis, Current Treatment, and Prognosis:  Patient/family is realistic regarding therapy needs and expressed being hopeful for SNF placement. Patient expressed understanding of CSW role and discharge process as well as medical condition. No questions/concerns about plan or treatment.    Emotional Assessment Appearance:  Appears stated age Attitude/Demeanor/Rapport:  Gracious Affect (typically observed):  Accepting, Adaptable, Pleasant, Quiet Orientation:  Oriented to Situation, Oriented to Self, Oriented to Place Alcohol / Substance use:  Not Applicable Psych involvement (Current and /or in the community):     Discharge Needs  Concerns to be addressed:  Discharge Planning Concerns Readmission within the last 30 days:  No Current discharge risk:  Dependent with Mobility Barriers to Discharge:  Continued Medical Work up   [employer], LCSW 01/06/2018, 10:47 AM

## 2018-01-06 NOTE — Progress Notes (Signed)
CSW reached out to [facility] which reports they are continuing to [employer] authorization from [facility].   CSW will continue to follow up.   [place], [place] [phone]

## 2018-01-06 NOTE — [place] FL2 (Signed)
[place] MEDICAID FL2 LEVEL OF CARE SCREENING TOOL     IDENTIFICATION  Patient Name: Charles Vasquez Birthdate: 03-22-1935 Sex: male Admission Date (Current Location): 01/02/2018  [facility] and [place] Number:  Producer, television/film/video and Address:  [facility] [place], [address], [place], [place] [address]      Provider Number: [id number]  Attending Physician Name and Address:  Nadara Mustard, MD  Relative Name and Phone Number:  Queen Blossom (sister) [phone]    Current Level of Care: Hospital Recommended Level of Care: Skilled Nursing Facility Prior Approval Number:    Date Approved/Denied:   PASRR Number: [id number]  Discharge Plan: SNF    Current Diagnoses: Patient Active Problem List   Diagnosis Date Noted  . Left below-knee amputee (HCC) 01/02/2018  . Gangrene of left foot (HCC)   . Osteomyelitis of finger of right hand (HCC) 09/24/2017  . Severe protein-calorie malnutrition (HCC) 09/16/2017  . 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 Labile blood pressure   . Chronic left shoulder pain   . Bacteremia   . Labile blood glucose   . Hypertensive crisis   . Hypoglycemia   . Leukocytosis   . Acute blood loss anemia   . Anemia of chronic disease   . ESRD on dialysis (HCC)   . Type 2 diabetes mellitus with peripheral neuropathy (HCC)   . Benign essential HTN   . 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 Pressure injury of skin 07/13/2017  . Diabetic foot infection (HCC) 07/12/2017  . CAD (coronary artery disease) 07/12/2017  . Type II diabetes mellitus with renal manifestations (HCC) 07/12/2017  . Wound of right foot   . Loose stools 05/20/2017  . Abdominal pain   . Loss of weight 03/18/2017  . Abnormal CT scan, colon 03/18/2017  . Melena 03/18/2017  . Periumbilical abdominal pain 03/18/2017  . ESRD (end stage renal disease) on dialysis (HCC)  10/22/2016  . Venous hypertension status post AVG procedure 08/10/2016  . Anasarca 08/04/2016  . Facial swelling 08/04/2016  . Effusion, other site 08/04/2016  . 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 Acute respiratory failure (HCC) 08/04/2016  . Hyperkalemia 08/04/2016  . Aftercare following surgery of the circulatory system, NEC-Right  AVGG 09/25/2012  . Other complications due to renal dialysis device, implant, and graft 05/01/2012  . End stage renal disease (HCC) 03/17/2012  . FATIGUE, ACUTE 08/22/2008  . HEMATURIA UNSPECIFIED 07/11/2008  . Diabetes (HCC) 01/19/2008  . Essential hypertension 01/19/2008  . MYOCARDIAL INFARCTION, HX OF 01/19/2008  . GERD 01/19/2008  . OSTEOARTHRITIS 01/19/2008  . CEREBROVASCULAR ACCIDENT, HX OF 01/19/2008    Orientation RESPIRATION BLADDER Height & Weight     Self, Place, Situation  Normal Continent Weight: 121 lb 11.1 oz (55.2 kg) Height:  5\' 7"  (170.2 cm)  BEHAVIORAL SYMPTOMS/MOOD NEUROLOGICAL BOWEL NUTRITION STATUS      Incontinent Diet(see discharge summary)  AMBULATORY STATUS COMMUNICATION OF NEEDS Skin   Limited Assist Verbally Surgical wounds(left leg and right arm closed surgical incision, negative pressure wound therapy left leg)                       Personal Care Assistance Level of Assistance  Bathing, Feeding, Dressing, Total care Bathing Assistance: Limited assistance Feeding assistance: Independent Dressing Assistance: Limited assistance Total Care Assistance: Limited assistance   Functional Limitations Info  Sight, Hearing, Speech Sight Info: Adequate Hearing  Info: Adequate Speech Info: Adequate    SPECIAL CARE FACTORS FREQUENCY  PT (By licensed PT), OT (By licensed OT)     PT Frequency: min 3x weekly OT Frequency: min 2x weekly            Contractures Contractures Info: Not present    Additional Factors Info  Code Status, Allergies, Isolation Precautions Code Status Info: full Allergies Info: Allergies:   Lisinopril     Isolation Precautions Info: Enteric pre- UV disinfection     Current Medications (01/06/2018):  This is the current hospital active medication list Current Facility-Administered Medications  Medication Dose Route Frequency Provider Last Rate Last Dose  . 0.9 %  sodium chloride infusion   Intravenous Continuous Nadara Mustard, MD 10 mL/hr at 01/02/18 1410    . acetaminophen (TYLENOL) tablet 325-650 mg  325-650 mg Oral Q6H PRN Nadara Mustard, MD   650 mg at 01/05/18 0211  . amitriptyline (ELAVIL) tablet 25 mg  25 mg Oral Q2000 Nadara Mustard, MD   25 mg at 01/05/18 2310  . aspirin EC tablet 325 mg  325 mg Oral Daily Nadara Mustard, MD   325 mg at 01/06/18 1610  . bisacodyl (DULCOLAX) suppository 10 mg  10 mg Rectal Daily PRN Nadara Mustard, MD      . Chlorhexidine Gluconate Cloth 2 % PADS 6 each  6 each Topical Q0600 Penninger, [place], [place]   6 each at 01/04/18 0528  . Chlorhexidine Gluconate Cloth 2 % PADS 6 each  6 each Topical Q0600 Penninger, [place], [place]   6 each at 01/06/18 [phone]  . Darbepoetin Alfa (ARANESP) injection 150 mcg  150 mcg Intravenous Q Sat-HD Penninger, [place], [place]   150 mcg at 01/03/18 0921  . docusate sodium (COLACE) capsule 100 mg  100 mg Oral BID Nadara Mustard, MD   100 mg at 01/03/18 2045  . doxercalciferol (HECTOROL) injection 2 mcg  2 mcg Intravenous Q M,W,F-HD Nadara Mustard, MD      . feeding supplement (NEPRO CARB STEADY) liquid 237 mL  237 mL Oral BID Nadara Mustard, MD   237 mL at 01/06/18 0835  . HYDROcodone-acetaminophen (NORCO) 7.5-325 MG per tablet 1-2 tablet  1-2 tablet Oral Q4H PRN Nadara Mustard, MD      . HYDROcodone-acetaminophen (NORCO/VICODIN) 5-325 MG per tablet 1-2 tablet  1-2 tablet Oral Q4H PRN Nadara Mustard, MD   1 tablet at 01/05/18 0345  . insulin aspart (novoLOG) injection 0-9 Units  0-9 Units Subcutaneous TID WC Nadara Mustard, MD   2 Units at 01/06/18 0703  . insulin aspart (novoLOG) injection 3 Units  3 Units Subcutaneous TID WC  Nadara Mustard, MD   3 Units at 01/06/18 [phone]  . insulin glargine (LANTUS) injection 8 Units  8 Units Subcutaneous Q M,W,F-2000 Nadara Mustard, MD   8 Units at 01/05/18 2100  . ipratropium-albuterol (DUONEB) 0.5-2.5 (3) MG/3ML nebulizer solution 3 mL  3 mL Nebulization Q6H PRN Nadara Mustard, MD      . losartan (COZAAR) tablet 50 mg  50 mg Oral Daily Nadara Mustard, MD   50 mg at 01/06/18 0831  . magnesium citrate solution 1 Bottle  1 Bottle Oral Once PRN Nadara Mustard, MD      . methocarbamol (ROBAXIN) tablet 500 mg  500 mg Oral Q6H PRN Nadara Mustard, MD   500 mg at 01/04/18 1047   Or  . methocarbamol (ROBAXIN) 500  mg in dextrose 5 % 50 mL IVPB  500 mg Intravenous Q6H PRN Nadara Mustard, MD      . metoCLOPramide (REGLAN) tablet 5-10 mg  5-10 mg Oral Q8H PRN Nadara Mustard, MD       Or  . metoCLOPramide (REGLAN) injection 5-10 mg  5-10 mg Intravenous Q8H PRN Nadara Mustard, MD      . morphine 2 MG/ML injection 0.5-1 mg  0.5-1 mg Intravenous Q2H PRN Nadara Mustard, MD   0.5 mg at 01/03/18 0105  . multivitamin (RENA-VIT) tablet 1 tablet  1 tablet Oral Daily Nadara Mustard, MD   1 tablet at 01/06/18 0831  . ondansetron (ZOFRAN) tablet 4 mg  4 mg Oral Q6H PRN Nadara Mustard, MD       Or  . ondansetron [facility]) injection 4 mg  4 mg Intravenous Q6H PRN Nadara Mustard, MD      . pantoprazole (PROTONIX) EC tablet 40 mg  40 mg Oral BID WC Nadara Mustard, MD   40 mg at 01/06/18 2330  . polyethylene glycol (MIRALAX / GLYCOLAX) packet 17 g  17 g Oral Daily PRN Nadara Mustard, MD   17 g at 01/03/18 0814  . sevelamer carbonate (RENVELA) tablet 1,600 mg  1,600 mg Oral TID WC Nadara Mustard, MD   1,600 mg at 01/06/18 0831   Facility-Administered Medications Ordered in Other Encounters  Medication Dose Route Frequency Provider Last Rate Last Dose  . 0.9 %  sodium chloride infusion (Manually program via Guardrails IV Fluids)   Intravenous Once [place], Fanny Bien, PA-C         Discharge  Medications: Please see discharge summary for a list of discharge medications.  Relevant Imaging Results:  Relevant Lab Results:   Additional Information SSN: [id number]  Gildardo Griffes, LCSW

## 2018-01-07 LAB — RENAL FUNCTION PANEL
Albumin: 2.1 g/dL — ABNORMAL LOW (ref 3.5–5.0)
Anion gap: 12 (ref 5–15)
BUN: 66 mg/dL — AB (ref 8–23)
CHLORIDE: 95 mmol/L — AB (ref 98–111)
CO2: 27 mmol/L (ref 22–32)
CREATININE: 6.91 mg/dL — AB (ref 0.61–1.24)
Calcium: 7.7 mg/dL — ABNORMAL LOW (ref 8.9–10.3)
GFR calc non Af Amer: 7 mL/min — ABNORMAL LOW (ref >60)
GFR, EST AFRICAN AMERICAN: 8 mL/min — AB (ref >60)
Glucose, Bld: 130 mg/dL — ABNORMAL HIGH (ref 70–99)
Phosphorus: 3.5 mg/dL (ref 2.5–4.6)
Potassium: 4.8 mmol/L (ref 3.5–5.1)
Sodium: 134 mmol/L — ABNORMAL LOW (ref 135–145)

## 2018-01-07 LAB — GLUCOSE, CAPILLARY
GLUCOSE-CAPILLARY: 103 mg/dL — AB (ref 70–99)
GLUCOSE-CAPILLARY: 189 mg/dL — AB (ref 70–99)
Glucose-Capillary: 271 mg/dL — ABNORMAL HIGH (ref 70–99)
Glucose-Capillary: 115 mg/dL — ABNORMAL HIGH (ref 70–99)
Glucose-Capillary: 154 mg/dL — ABNORMAL HIGH (ref 70–99)
Glucose-Capillary: 284 mg/dL — ABNORMAL HIGH (ref 70–99)
Glucose-Capillary: 121 mg/dL — ABNORMAL HIGH (ref 70–99)

## 2018-01-07 LAB — CBC
HEMATOCRIT: 26.5 % — AB (ref 39.0–52.0)
Hemoglobin: 7.6 g/dL — ABNORMAL LOW (ref 13.0–17.0)
MCH: 24.8 pg — ABNORMAL LOW (ref 26.0–34.0)
MCHC: 28.7 g/dL — ABNORMAL LOW (ref 30.0–36.0)
MCV: 86.6 fL (ref 80.0–100.0)
NRBC: 0 % (ref 0.0–0.2)
Platelets: 261 10*3/uL (ref 150–400)
RBC: 3.06 MIL/uL — ABNORMAL LOW (ref 4.22–5.81)
RDW: 16.7 % — AB (ref 11.5–15.5)
WBC: 10.2 10*3/uL (ref 4.0–10.5)

## 2018-01-07 LAB — PREPARE RBC (CROSSMATCH)

## 2018-01-07 MED ORDER — SODIUM CHLORIDE 0.9% IV SOLUTION
Freq: Once | INTRAVENOUS | Status: AC
  Administered 2018-01-07: 12:00:00 via INTRAVENOUS

## 2018-01-07 MED ORDER — HEPARIN SODIUM (PORCINE) 1000 UNIT/ML DIALYSIS
2000.0000 [IU] | Freq: Once | INTRAMUSCULAR | Status: AC
  Administered 2018-01-07: 2000 [IU] via INTRAVENOUS_CENTRAL

## 2018-01-07 MED ORDER — HEPARIN SODIUM (PORCINE) 1000 UNIT/ML IJ SOLN
INTRAMUSCULAR | Status: AC
  Administered 2018-01-07: 2000 [IU] via INTRAVENOUS_CENTRAL
  Filled 2018-01-07: qty 2

## 2018-01-07 MED ORDER — DOXERCALCIFEROL 4 MCG/2ML IV SOLN
INTRAVENOUS | Status: AC
  Administered 2018-01-07: 2 ug via INTRAVENOUS
  Filled 2018-01-07: qty 2

## 2018-01-07 NOTE — Consult Note (Signed)
   [facility] CM Inpatient Consult   01/07/2018  Charles Vasquez [birth year] [id number]     Patient screened for potential [facility] Care Management services due to unplanned readmission risk score of 35% (extreme).  Chart reviewed. Noted discharge plan is for SNF. No identifiable [facility] Care Management needs at this time.   [name], MSN-Ed, RN,BSN [facility] Liaison [phone]

## 2018-01-07 NOTE — Discharge Summary (Signed)
Discharge Diagnoses:  Active Problems:   Gangrene of left foot (HCC)   Left below-knee amputee [facility])   Surgeries: Procedure(s): LEFT BELOW KNEE AMPUTATION on 01/02/2018    Consultants: Treatment Team:  Terrial Rhodes, MD  Discharged Condition: Improved  Hospital Course: Charles Vasquez is an 82 y.o. male who was admitted 01/02/2018 with a chief complaint of gangrene left foot, with a final diagnosis of Gangrene Left Foot.  Patient was brought to the operating room on 01/02/2018 and underwent Procedure(s): LEFT BELOW KNEE AMPUTATION.    Patient was given perioperative antibiotics:  Anti-infectives (From admission, onward)   Start     Dose/Rate Route Frequency Ordered Stop   01/02/18 1415  ceFAZolin (ANCEF) IVPB 1 g/50 mL premix     1 g 100 mL/hr over 30 Minutes Intravenous Every 12 hours 01/02/18 1400 01/02/18 2109   01/02/18 0700  ceFAZolin (ANCEF) IVPB 2g/100 mL premix     2 g 200 mL/hr over 30 Minutes Intravenous On call to O.R. 01/02/18 2841 01/02/18 1027    .  Patient was given sequential compression devices, early ambulation, and aspirin for DVT prophylaxis.  Recent vital signs:  Patient Vitals for the past 24 hrs:  BP Temp Temp src Pulse Resp SpO2 Weight  01/07/18 1030 (!) (P) 99/40 - - (P) 88 - - -  01/07/18 1000 (!) (P) 98/49 - - (P) 89 - - -  01/07/18 0930 (!) (P) 100/54 - - (P) 86 - - -  01/07/18 0900 (!) 116/55 - - 85 - - -  01/07/18 0830 126/60 - - 89 - - -  01/07/18 0800 126/60 - - 84 - - -  01/07/18 0715 (!) 144/74 - - 81 - - -  01/07/18 0710 (!) 146/77 - - 81 - - -  01/07/18 0700 (!) 148/80 98 F (36.7 C) Oral 83 12 - 49.9 kg  01/07/18 0601 (!) 145/63 98.4 F (36.9 C) Oral 82 16 100 % -  01/06/18 2129 (!) 141/60 98.5 F (36.9 C) Oral 100 16 100 % -  01/06/18 1220 137/72 - - (!) 105 - (!) 53 % -  .  Recent laboratory studies: No results found.  Discharge Medications:   Allergies as of 01/07/2018      Reactions   Lisinopril Other (See  Comments)   HYPERKALEMIA RENAL DYSFUNCTION PROGRESSION      Medication List    STOP taking these medications   collagenase ointment Commonly known as:  SANTYL   traMADol 50 MG tablet Commonly known as:  ULTRAM     TAKE these medications   acetaminophen 650 MG CR tablet Commonly known as:  TYLENOL Take 650 mg by mouth every 6 (six) hours as needed for pain.   amitriptyline 25 MG tablet Commonly known as:  ELAVIL Take 25 mg by mouth daily at 8 pm. (2000)   Darbepoetin Alfa 200 MCG/0.4ML Sosy injection Commonly known as:  ARANESP Inject 0.4 mLs (200 mcg total) into the vein every Monday with hemodialysis.   diclofenac sodium 1 % Gel Commonly known as:  VOLTAREN Apply 2 g topically 4 (four) times daily.   dicyclomine 10 MG capsule Commonly known as:  BENTYL 1 PO 30 MINUTES PRIOR TO BREAKFAST AND LUNCH What changed:    how much to take  how to take this  when to take this  additional instructions   doxercalciferol 4 MCG/2ML injection Commonly known as:  HECTOROL Inject 1 mL (2 mcg total) into the vein every Monday,  Wednesday, and Friday with hemodialysis.   feeding supplement (NEPRO CARB STEADY) Liqd Take 237 mLs by mouth 2 (two) times daily.   GLUCAGON EMERGENCY 1 MG injection Generic drug:  glucagon Inject 1 mg into the vein daily as needed (hypoglycemia).   HYDROcodone-acetaminophen 5-325 MG tablet Commonly known as:  NORCO/VICODIN Take 1 tablet by mouth every 4 (four) hours as needed for moderate pain. What changed:    when to take this  reasons to take this   insulin glargine 100 UNIT/ML injection Commonly known as:  LANTUS Inject 0.08 mLs (8 Units total) into the skin every Monday, Wednesday, and Friday at 8 PM.   insulin lispro 100 UNIT/ML injection Commonly known as:  HUMALOG Inject 0-8 Units into the skin 3 (three) times daily before meals. Blood sugar of 70-100=0 units 101-250=1 unit 151-200=2 units 201-250=4 units  251-300=6  units 301-350= 8 units Greater than 350= 10 units **Call MD if glucose greater than 450**   ipratropium-albuterol 0.5-2.5 (3) MG/3ML Soln Commonly known as:  DUONEB Take 3 mLs by nebulization every 6 (six) hours as needed (shortness of breath).   iron polysaccharides 150 MG capsule Commonly known as:  NIFEREX Take 1 capsule (150 mg total) by mouth 2 (two) times daily. What changed:  additional instructions   lidocaine 5 % Commonly known as:  LIDODERM Place 1 patch onto the skin daily. Remove & Discard patch within 12 hours or as directed by MD   losartan 50 MG tablet Commonly known as:  COZAAR Take 50 mg by mouth daily.   multivitamin Tabs tablet Take 1 tablet by mouth daily.   pantoprazole 40 MG tablet Commonly known as:  PROTONIX Take 1 tablet (40 mg total) by mouth 2 (two) times daily with a meal. What changed:  additional instructions   pentoxifylline 400 MG CR tablet Commonly known as:  TRENTAL Take 1 tablet (400 mg total) by mouth 3 (three) times daily with meals.   Polyethylene Glycol 1000 Powd Take 17 g by mouth daily as needed (constipation).   senna 8.6 MG tablet Commonly known as:  SENOKOT Take 2 tablets by mouth daily as needed for constipation.   sevelamer carbonate 800 MG tablet Commonly known as:  RENVELA Take 2 tablets (1,600 mg total) by mouth 3 (three) times daily with meals. What changed:  additional instructions            Discharge Care Instructions  (From admission, onward)         Start     Ordered   01/06/18 0000  Non weight bearing    Question Answer Comment  Laterality left   Extremity Lower      01/06/18 0732          Diagnostic Studies: No results found.  Patient benefited maximally from their hospital stay and there were no complications.     Disposition: Discharge disposition: 03-Skilled Nursing Facility      Discharge Instructions    Call MD / Call 911   Complete by:  As directed    If you experience  chest pain or shortness of breath, CALL 911 and be transported to the hospital emergency room.  If you develope a fever above 101 F, pus (white drainage) or increased drainage or redness at the wound, or calf pain, call your surgeon's office.   Call MD / Call 911   Complete by:  As directed    If you experience chest pain or shortness of breath, CALL 911 and be  transported to the hospital emergency room.  If you develope a fever above 101 F, pus (white drainage) or increased drainage or redness at the wound, or calf pain, call your surgeon's office.   Constipation Prevention   Complete by:  As directed    Drink plenty of fluids.  Prune juice may be helpful.  You may use a stool softener, such as Colace (over the counter) 100 mg twice a day.  Use MiraLax (over the counter) for constipation as needed.   Constipation Prevention   Complete by:  As directed    Drink plenty of fluids.  Prune juice may be helpful.  You may use a stool softener, such as Colace (over the counter) 100 mg twice a day.  Use MiraLax (over the counter) for constipation as needed.   Diet - low sodium heart healthy   Complete by:  As directed    Diet - low sodium heart healthy   Complete by:  As directed    Increase activity slowly as tolerated   Complete by:  As directed    Increase activity slowly as tolerated   Complete by:  As directed    Negative Pressure Wound Therapy - Incisional   Complete by:  As directed    Continue portable prevena wound vac for 1 week   Non weight bearing   Complete by:  As directed    Laterality:  left   Extremity:  Lower     Follow-up Information    Nadara Mustard, MD In 1 week.   Specialty:  Orthopedic Surgery Contact information: [address] [place] [address] [phone]            Signed: Nadara Mustard 01/07/2018, 11:18 AM

## 2018-01-07 NOTE — Progress Notes (Signed)
[facility] Progress Note    Assessment/ Plan:   Dialysis Orders: MWF - [name] 3.25hrs, [id number]  Access:L thigh AVG Heparin2000 load and 500 qhr EPO 14000qHD  Assessment/Plan: 1. Gangrenous L foot - s/p L BKA Dr. XLKG40/10. Pain controlled on the whole but pt c/o pain this AM. Per ortho. 2. ESRD - On HD MWF.  Last Hd Mon.  - Seen on HD  96/52 UF 3L net 2.5L on 2K bath 2U PRBC requested and hopefully we'll be able to give it to him on HD  3. Hypertension/volume - BPwell controlled.Does not appear volume overloaded on exam. Will need lower edw at d/c. Titrate down as tolerated.  4. Anemia of CKD - Hgb8.4, s/p 1Unit pRBC -> 7.6 (req 2U). Aranesp 16mcggiven10/26. 5. Secondary Hyperparathyroidism -Ca and phos at goal. 6. Nutrition -Alb 2.4. Renal diet w/fluid restrictions once advanced. Nepro. Renavite.   Subjective:   Lethargic on HD w/ stable vitals, repeat FS was in the 150's. Arousable.   Objective:   BP (!) (P) 100/54   Pulse (P) 86   Temp 98 F (36.7 C) (Oral)   Resp 12   Ht 5\' 7"  (1.702 m)   Wt 49.9 kg   SpO2 100%   BMI 17.23 kg/m   Intake/Output Summary (Last 24 hours) at 01/07/2018 1018 Last data filed at 01/07/2018 0500 Gross per 24 hour  Intake 717 ml  Output 0 ml  Net 717 ml   Weight change: -7.3 kg  Physical Exam: General:NAD, chronically ill appearing elderly male Heart:RRR, no mrg Lungs:CTAB anteriorly. nml WOB Abdomen:soft, NTND Extremities:b/l BKA, L leg w/ wound vac in place. No stump edema Dialysis Access:L thigh AVG +thrill  Imaging: No results found.  Labs: BMET Recent Labs  Lab 01/02/18 0747 01/03/18 0916 01/05/18 1304 01/06/18 1327 01/07/18 0721  NA 133* 132* 136  --  134*  K 4.7 5.4* 4.2  --  4.8  CL 96* 96* 101  --  95*  CO2 24 22 24   --  27  GLUCOSE 218* 258* 169* 341* 130*  BUN 62* 76* 64*  --  66*  CREATININE 6.86* 8.15* 7.91*  --  6.91*  CALCIUM  7.7* 7.6* 7.6*  --  7.7*  PHOS  --  3.8 3.6  --  3.5   CBC Recent Labs  Lab 01/02/18 0747 01/03/18 0916 01/05/18 0114 01/07/18 0721  WBC 8.7 11.6* 10.0 10.2  HGB 7.4* 8.3* 8.8* 7.6*  HCT 27.0* 28.1* 30.3* 26.5*  MCV 87.9 87.3 87.1 86.6  PLT 217 213 221 261    Medications:    . sodium chloride   Intravenous Once  . amitriptyline  25 mg Oral Q2000  . aspirin EC  325 mg Oral Daily  . Chlorhexidine Gluconate Cloth  6 each Topical Q0600  . Chlorhexidine Gluconate Cloth  6 each Topical Q0600  . darbepoetin (ARANESP) injection - DIALYSIS  150 mcg Intravenous Q Sat-HD  . docusate sodium  100 mg Oral BID  . doxercalciferol  2 mcg Intravenous Q M,W,F-HD  . feeding supplement (NEPRO CARB STEADY)  237 mL Oral BID  . insulin aspart  0-9 Units Subcutaneous TID WC  . insulin aspart  3 Units Subcutaneous TID WC  . insulin glargine  8 Units Subcutaneous Q M,W,F-2000  . losartan  50 mg Oral Daily  . multivitamin  1 tablet Oral Daily  . pantoprazole  40 mg Oral BID WC  . sevelamer carbonate  1,600 mg Oral TID WC  Paulene Floor, MD 01/07/2018, 10:18 AM

## 2018-01-07 NOTE — Plan of Care (Signed)

## 2018-01-07 NOTE — Progress Notes (Signed)
Inpatient Diabetes Program Recommendations  AACE/ADA: New Consensus Statement on Inpatient Glycemic Control (2015)  Target Ranges:  Prepandial:   less than 140 mg/dL      Peak postprandial:   less than 180 mg/dL (1-2 hours)      Critically ill patients:  140 - 180 mg/dL   Lab Results  Component Value Date   GLUCAP 115 (H) 01/07/2018   HGBA1C 6.6 (H) 10/08/2017    Review of Glycemic Control Results for WOODSON, KASDORF (MRN [id number]) as of 01/07/2018 11:25  Ref. Range 01/06/2018 17:03 01/06/2018 21:27 01/07/2018 06:33 01/07/2018 08:43  Glucose-Capillary Latest Ref Range: 70 - 99 mg/dL 832 (H) 919 (H) 166 (H) 115 (H)   Results for ALANA, DELO (MRN [id number]) as of 01/07/2018 11:25  Ref. Range 01/06/2018 11:27 01/06/2018 11:30 01/06/2018 11:43 01/06/2018 11:57  Glucose-Capillary Latest Ref Range: 70 - 99 mg/dL 15 (LL) 28 (LL) 23 (LL) 53 (L)   Diabetes history: Type 2 DM Outpatient Diabetes medications: Lantus 8 units QM-W-F QHS, Humalog 0-8 units TID Current orders for Inpatient glycemic control: Lantus 8 units QM-W-F QHS, Novolog 3 units TID, Novolog 0-9 units TID  Inpatient Diabetes Program Recommendations:    Noted significant event involving hypoglycemic episode of 15 mg/dL. This was following the first administration of Lantus 8 units while inpatient. Also, patient received Novolog 3 units for meal coverage.   If to remain inpatient and at discharge, consider changing Lantus to 4 units M-W-F and discontinuing Novolog 3 units TID. Looking at inpatient trends, A1C, and age of patient I question need for basal insulin. With that I would ensure patient is to follow up with PCP.   Thanks, Lujean Rave, MSN, RNC-OB Diabetes Coordinator [phone] (8a-5p)

## 2018-01-07 NOTE — Clinical Social Work Placement (Signed)
   CLINICAL SOCIAL WORK PLACEMENT  NOTE  Date:  01/07/2018  Patient Details  Name: Charles Vasquez MRN: [id number] Date of Birth: 1935/12/23  Clinical Social Work is seeking post-discharge placement for this patient at the Skilled  Nursing Facility level of care (*CSW will initial, date and re-position this form in  chart as items are completed):  Yes   Patient/family provided with [facility] Clinical Social Work Department's list of facilities offering this level of care within the geographic area requested by the patient (or if unable, by the patient's family).  Yes   Patient/family informed of their freedom to choose among providers that offer the needed level of care, that participate in Medicare, Medicaid or managed care program needed by the patient, have an available bed and are willing to accept the patient.      Patient/family informed of [facility]'s ownership interest in [facility] and [facility], as well as of the fact that they are under no obligation to receive care at these facilities.  PASRR submitted to EDS on       PASRR number received on 01/06/18     Existing PASRR number confirmed on       FL2 transmitted to all facilities in geographic area requested by pt/family on 01/06/18     FL2 transmitted to all facilities within larger geographic area on       Patient informed that his/her managed care company has contracts with or will negotiate with certain facilities, including the following:        Yes   Patient/family informed of bed offers received.  Patient chooses bed at [facility]     Physician recommends and patient chooses bed at      Patient to be transferred to [facility] on 01/07/18.  Patient to be transferred to facility by [facility]     Patient family notified on 01/07/18 of transfer.  Name of family member notified:  [name] (sister)     PHYSICIAN       Additional Comment:     _______________________________________________ Gildardo Griffes, LCSW 01/07/2018, 12:05 PM

## 2018-01-07 NOTE — Progress Notes (Signed)
Started transfusing second unit PRBC, Pt not in distress, no SOB, vital signs taken and recorded, Pt sleepy but responds to voice, easily aroused. No side effects/reactions noted, will continue to monitor and will endorse accordingly to oncoming RN.

## 2018-01-07 NOTE — Progress Notes (Signed)
Physical Therapy Cancellation Note   01/07/18 1112  PT Visit Information  Last PT Received On 01/07/18  Reason Eval/Treat Not Completed Patient declined, no reason specified. Pt in HD. PT will continue to follow acutely.    Erline Levine, PTA Acute Rehabilitation Services Pager: [phone] Office: [phone]

## 2018-01-07 NOTE — Progress Notes (Signed)
Received Pt on bed from HD, alert and oriented x3, Pt not in distress, vital signs and CBG taken and recorded. Started transfusing 1 unit PRBC at 12:25 but PIV infiltrated. Restarted blood transfusion at 13:25, vital signs taken 15 mins after restarting transfusion, Pt alert and awake, no SOB, no side effects/reaction noted, Pt's wife and cousin at bedside. Will continue to monitor.

## 2018-01-07 NOTE — Progress Notes (Signed)
Patient ID: Charles Vasquez, male   DOB: 12/17/1935, 82 y.o.   MRN: [id number] Patients discharge was held yesterday due to hypogylcemia, plan for discharge after dialysis,

## 2018-01-08 DIAGNOSIS — M6281 Muscle weakness (generalized): Secondary | ICD-10-CM | POA: Diagnosis not present

## 2018-01-08 DIAGNOSIS — N186 End stage renal disease: Secondary | ICD-10-CM | POA: Diagnosis not present

## 2018-01-08 DIAGNOSIS — E785 Hyperlipidemia, unspecified: Secondary | ICD-10-CM | POA: Diagnosis not present

## 2018-01-08 DIAGNOSIS — R279 Unspecified lack of coordination: Secondary | ICD-10-CM | POA: Diagnosis not present

## 2018-01-08 DIAGNOSIS — I120 Hypertensive chronic kidney disease with stage 5 chronic kidney disease or end stage renal disease: Secondary | ICD-10-CM | POA: Diagnosis not present

## 2018-01-08 DIAGNOSIS — Z992 Dependence on renal dialysis: Secondary | ICD-10-CM | POA: Diagnosis not present

## 2018-01-08 DIAGNOSIS — Z743 Need for continuous supervision: Secondary | ICD-10-CM | POA: Diagnosis not present

## 2018-01-08 DIAGNOSIS — E1122 Type 2 diabetes mellitus with diabetic chronic kidney disease: Secondary | ICD-10-CM | POA: Diagnosis not present

## 2018-01-08 DIAGNOSIS — Z4781 Encounter for orthopedic aftercare following surgical amputation: Secondary | ICD-10-CM | POA: Diagnosis not present

## 2018-01-08 DIAGNOSIS — Z89512 Acquired absence of left leg below knee: Secondary | ICD-10-CM | POA: Diagnosis not present

## 2018-01-08 DIAGNOSIS — R531 Weakness: Secondary | ICD-10-CM | POA: Diagnosis not present

## 2018-01-08 LAB — BPAM RBC
Blood Product Expiration Date: 201911052359
Blood Product Expiration Date: 201911052359
ISSUE DATE / TIME: 201910301212
ISSUE DATE / TIME: 201910301801
UNIT TYPE AND RH: 5100
Unit Type and Rh: 5100

## 2018-01-08 LAB — TYPE AND SCREEN
ABO/RH(D): O POS
Antibody Screen: NEGATIVE
Unit division: 0
Unit division: 0

## 2018-01-08 LAB — HEPATITIS B SURFACE ANTIBODY,QUALITATIVE: HEP B S AB: REACTIVE

## 2018-01-08 LAB — GLUCOSE, CAPILLARY
GLUCOSE-CAPILLARY: 202 mg/dL — AB (ref 70–99)
Glucose-Capillary: 15 mg/dL — CL (ref 70–99)
Glucose-Capillary: 193 mg/dL — ABNORMAL HIGH (ref 70–99)

## 2018-01-08 LAB — HEMOGLOBIN AND HEMATOCRIT, BLOOD
HCT: 33.5 % — ABNORMAL LOW (ref 39.0–52.0)
Hemoglobin: 10.1 g/dL — ABNORMAL LOW (ref 13.0–17.0)

## 2018-01-08 NOTE — Progress Notes (Signed)
Pt alert and oriented x3, disoriented to time, switched to prevena wound vac.

## 2018-01-08 NOTE — Plan of Care (Signed)

## 2018-01-08 NOTE — Progress Notes (Signed)
Called facility, gave report to nurse [facility], Pt not in distress, prevena in place, vital signs and CBG taken and recorded, denies pain, no SOB, alert and oriented, discharged to facility with belongings via [facility]. Family at bedside updated. Facility wheelchair labeled and will be picked up by facility.

## 2018-01-08 NOTE — Progress Notes (Signed)
Patient ID: Charles Vasquez, male   DOB: [birth year], 82 y.o.   MRN: [id number] Patient received transfusion yesterday.  Vital signs stable this morning.  Patient alert oriented.  Family at bedside.  Anticipate discharge to skilled nursing.

## 2018-01-08 NOTE — Clinical Social Work Note (Signed)
Left voicemail for SNF admissions coordinator to notify her that updated discharge summary is in. Waiting on call back for time to arrange transport.  Charlynn Court, CSW [phone]

## 2018-01-08 NOTE — Discharge Summary (Signed)
Discharge Diagnoses:  Active Problems:   Gangrene of left foot (HCC)   Left below-knee amputee [facility])   Surgeries: Procedure(s): LEFT BELOW KNEE AMPUTATION on 01/02/2018    Consultants: Treatment Team:  Terrial Rhodes, MD  Discharged Condition: Improved  Hospital Course: Charles Vasquez is an 82 y.o. male who was admitted 01/02/2018 with a chief complaint of gangrene left foot, with a final diagnosis of Gangrene Left Foot.  Patient was brought to the operating room on 01/02/2018 and underwent Procedure(s): LEFT BELOW KNEE AMPUTATION.    Patient was given perioperative antibiotics:  Anti-infectives (From admission, onward)   Start     Dose/Rate Route Frequency Ordered Stop   01/02/18 1415  ceFAZolin (ANCEF) IVPB 1 g/50 mL premix     1 g 100 mL/hr over 30 Minutes Intravenous Every 12 hours 01/02/18 1400 01/02/18 2109   01/02/18 0700  ceFAZolin (ANCEF) IVPB 2g/100 mL premix     2 g 200 mL/hr over 30 Minutes Intravenous On call to O.R. 01/02/18 4098 01/02/18 1027    .  Patient was given sequential compression devices, early ambulation, and aspirin for DVT prophylaxis.  Recent vital signs:  Patient Vitals for the past 24 hrs:  BP Temp Temp src Pulse Resp SpO2 Weight  01/08/18 0629 (!) 120/57 98 F (36.7 C) Axillary 86 17 99 % -  01/07/18 2209 (!) 172/77 98.3 F (36.8 C) Oral 88 - 98 % -  01/07/18 2035 (!) 151/90 98.5 F (36.9 C) Oral 90 - 100 % -  01/07/18 1843 (!) 118/56 98.2 F (36.8 C) Oral 92 17 100 % -  01/07/18 1810 (!) 122/58 98.5 F (36.9 C) Oral 91 15 100 % -  01/07/18 1719 (!) 118/48 - - - - - -  01/07/18 1613 122/62 - - - - - -  01/07/18 1606 (!) 159/77 98.7 F (37.1 C) Oral 93 15 100 % -  01/07/18 1526 (!) 142/73 - - 93 - - -  01/07/18 1523 100/81 - - 93 - (!) 88 % -  01/07/18 1521 (!) 95/23 98.1 F (36.7 C) Oral 92 - - -  01/07/18 1347 136/63 97.9 F (36.6 C) Oral 90 16 100 % -  01/07/18 1158 (!) 121/59 (!) 97.4 F (36.3 C) Oral 91 16 100 % -   01/07/18 1110 (!) 106/49 (!) 97.4 F (36.3 C) Oral 86 14 95 % 47.9 kg  01/07/18 1100 (!) 97/44 - - 88 - - -  01/07/18 1030 (!) 99/40 - - 88 - - -  01/07/18 1000 (!) 98/49 - - 89 - - -  01/07/18 0930 (!) 100/54 - - 86 - - -  01/07/18 0900 (!) 116/55 - - 85 - - -  01/07/18 0830 126/60 - - 89 - - -  01/07/18 0800 126/60 - - 84 - - -  01/07/18 0715 (!) 144/74 - - 81 - - -  01/07/18 0710 (!) 146/77 - - 81 - - -  01/07/18 0700 (!) 148/80 98 F (36.7 C) Oral 83 12 - 49.9 kg  .  Recent laboratory studies: No results found.  Discharge Medications:   Allergies as of 01/08/2018      Reactions   Lisinopril Other (See Comments)   HYPERKALEMIA RENAL DYSFUNCTION PROGRESSION      Medication List    STOP taking these medications   collagenase ointment Commonly known as:  SANTYL   traMADol 50 MG tablet Commonly known as:  ULTRAM     TAKE these  medications   acetaminophen 650 MG CR tablet Commonly known as:  TYLENOL Take 650 mg by mouth every 6 (six) hours as needed for pain.   amitriptyline 25 MG tablet Commonly known as:  ELAVIL Take 25 mg by mouth daily at 8 pm. (2000)   Darbepoetin Alfa 200 MCG/0.4ML Sosy injection Commonly known as:  ARANESP Inject 0.4 mLs (200 mcg total) into the vein every Monday with hemodialysis.   diclofenac sodium 1 % Gel Commonly known as:  VOLTAREN Apply 2 g topically 4 (four) times daily.   dicyclomine 10 MG capsule Commonly known as:  BENTYL 1 PO 30 MINUTES PRIOR TO BREAKFAST AND LUNCH What changed:    how much to take  how to take this  when to take this  additional instructions   doxercalciferol 4 MCG/2ML injection Commonly known as:  HECTOROL Inject 1 mL (2 mcg total) into the vein every Monday, Wednesday, and Friday with hemodialysis.   feeding supplement (NEPRO CARB STEADY) Liqd Take 237 mLs by mouth 2 (two) times daily.   GLUCAGON EMERGENCY 1 MG injection Generic drug:  glucagon Inject 1 mg into the vein daily as needed  (hypoglycemia).   HYDROcodone-acetaminophen 5-325 MG tablet Commonly known as:  NORCO/VICODIN Take 1 tablet by mouth every 4 (four) hours as needed for moderate pain. What changed:    when to take this  reasons to take this   insulin glargine 100 UNIT/ML injection Commonly known as:  LANTUS Inject 0.08 mLs (8 Units total) into the skin every Monday, Wednesday, and Friday at 8 PM.   insulin lispro 100 UNIT/ML injection Commonly known as:  HUMALOG Inject 0-8 Units into the skin 3 (three) times daily before meals. Blood sugar of 70-100=0 units 101-250=1 unit 151-200=2 units 201-250=4 units  251-300=6 units 301-350= 8 units Greater than 350= 10 units **Call MD if glucose greater than 450**   ipratropium-albuterol 0.5-2.5 (3) MG/3ML Soln Commonly known as:  DUONEB Take 3 mLs by nebulization every 6 (six) hours as needed (shortness of breath).   iron polysaccharides 150 MG capsule Commonly known as:  NIFEREX Take 1 capsule (150 mg total) by mouth 2 (two) times daily. What changed:  additional instructions   lidocaine 5 % Commonly known as:  LIDODERM Place 1 patch onto the skin daily. Remove & Discard patch within 12 hours or as directed by MD   losartan 50 MG tablet Commonly known as:  COZAAR Take 50 mg by mouth daily.   multivitamin Tabs tablet Take 1 tablet by mouth daily.   pantoprazole 40 MG tablet Commonly known as:  PROTONIX Take 1 tablet (40 mg total) by mouth 2 (two) times daily with a meal. What changed:  additional instructions   pentoxifylline 400 MG CR tablet Commonly known as:  TRENTAL Take 1 tablet (400 mg total) by mouth 3 (three) times daily with meals.   Polyethylene Glycol 1000 Powd Take 17 g by mouth daily as needed (constipation).   senna 8.6 MG tablet Commonly known as:  SENOKOT Take 2 tablets by mouth daily as needed for constipation.   sevelamer carbonate 800 MG tablet Commonly known as:  RENVELA Take 2 tablets (1,600 mg total) by mouth  3 (three) times daily with meals. What changed:  additional instructions            Discharge Care Instructions  (From admission, onward)         Start     Ordered   01/06/18 0000  Non weight bearing  Question Answer Comment  Laterality left   Extremity Lower      01/06/18 0732          Diagnostic Studies: No results found.  Patient benefited maximally from their hospital stay and there were no complications.     Disposition: Discharge disposition: 03-Skilled Nursing Facility      Discharge Instructions    Call MD / Call 911   Complete by:  As directed    If you experience chest pain or shortness of breath, CALL 911 and be transported to the hospital emergency room.  If you develope a fever above 101 F, pus (white drainage) or increased drainage or redness at the wound, or calf pain, call your surgeon's office.   Call MD / Call 911   Complete by:  As directed    If you experience chest pain or shortness of breath, CALL 911 and be transported to the hospital emergency room.  If you develope a fever above 101 F, pus (white drainage) or increased drainage or redness at the wound, or calf pain, call your surgeon's office.   Call MD / Call 911   Complete by:  As directed    If you experience chest pain or shortness of breath, CALL 911 and be transported to the hospital emergency room.  If you develope a fever above 101 F, pus (white drainage) or increased drainage or redness at the wound, or calf pain, call your surgeon's office.   Constipation Prevention   Complete by:  As directed    Drink plenty of fluids.  Prune juice may be helpful.  You may use a stool softener, such as Colace (over the counter) 100 mg twice a day.  Use MiraLax (over the counter) for constipation as needed.   Constipation Prevention   Complete by:  As directed    Drink plenty of fluids.  Prune juice may be helpful.  You may use a stool softener, such as Colace (over the counter) 100 mg twice a  day.  Use MiraLax (over the counter) for constipation as needed.   Constipation Prevention   Complete by:  As directed    Drink plenty of fluids.  Prune juice may be helpful.  You may use a stool softener, such as Colace (over the counter) 100 mg twice a day.  Use MiraLax (over the counter) for constipation as needed.   Diet - low sodium heart healthy   Complete by:  As directed    Diet - low sodium heart healthy   Complete by:  As directed    Diet - low sodium heart healthy   Complete by:  As directed    Increase activity slowly as tolerated   Complete by:  As directed    Increase activity slowly as tolerated   Complete by:  As directed    Increase activity slowly as tolerated   Complete by:  As directed    Negative Pressure Wound Therapy - Incisional   Complete by:  As directed    Continue portable prevena wound vac for 1 week   Non weight bearing   Complete by:  As directed    Laterality:  left   Extremity:  Lower      Contact information for follow-up providers    Nadara Mustard, MD In 1 week.   Specialty:  Orthopedic Surgery Contact information: [address] [place] [address] [phone]            Contact information for  after-discharge care    Destination    [facility] Preferred SNF .   Service:  Skilled Nursing Contact information: [address] [place] [address] [phone]                   Signed: Nadara Mustard 01/08/2018, 6:48 AM

## 2018-01-08 NOTE — Clinical Social Work Note (Signed)
CSW facilitated patient discharge including contacting patient family and facility to confirm patient discharge plans. Clinical information faxed to facility and family agreeable with plan. CSW arranged ambulance transport via [facility] to [facility]. RN to call report prior to discharge ([phone]).  CSW will sign off for now as social work intervention is no longer needed. Please consult us again if new needs arise.  Johnathan Heskett, CSW [phone]  

## 2018-01-08 NOTE — Clinical Social Work Placement (Signed)
   CLINICAL SOCIAL WORK PLACEMENT  NOTE  Date:  01/08/2018  Patient Details  Name: Charles Vasquez MRN: [id number] Date of Birth: 1936/02/25  Clinical Social Work is seeking post-discharge placement for this patient at the Skilled  Nursing Facility level of care (*CSW will initial, date and re-position this form in  chart as items are completed):  Yes   Patient/family provided with [facility] Clinical Social Work Department's list of facilities offering this level of care within the geographic area requested by the patient (or if unable, by the patient's family).  Yes   Patient/family informed of their freedom to choose among providers that offer the needed level of care, that participate in Medicare, Medicaid or managed care program needed by the patient, have an available bed and are willing to accept the patient.  Yes   Patient/family informed of [facility]'s ownership interest in [facility] and [facility], as well as of the fact that they are under no obligation to receive care at these facilities.  PASRR submitted to EDS on       PASRR number received on 01/06/18     Existing PASRR number confirmed on       FL2 transmitted to all facilities in geographic area requested by pt/family on 01/06/18     FL2 transmitted to all facilities within larger geographic area on       Patient informed that his/her managed care company has contracts with or will negotiate with certain facilities, including the following:        Yes   Patient/family informed of bed offers received.  Patient chooses bed at [facility]     Physician recommends and patient chooses bed at      Patient to be transferred to [facility] on 01/08/18.  Patient to be transferred to facility by [facility]     Patient family notified on 01/08/18 of transfer.  Name of family member notified:  [name] (sister)     PHYSICIAN       Additional Comment:     _______________________________________________ Margarito Liner, LCSW 01/08/2018, 12:30 PM

## 2018-01-08 NOTE — Progress Notes (Signed)
  Charles Vasquez Progress Note    Assessment/ Plan:   Dialysis Orders: MWF - [name] 3.25hrs, [id number]  Access:L thigh AVG Heparin2000 load and 500 qhr EPO 14000qHD  Assessment/Plan: 1. Gangrenous L foot - s/p L BKA Dr. EPPI95/18. Pain controlled on the whole but pt c/o pain this AM. Per ortho. 2. ESRD - On HD MWF.  Last Hd Mon.  - Last HD Wed - being transferred to facility and will get HD at [facility]    3. Hypertension/volume - BPwell controlled.Does not appear volume overloaded on exam. Will need lower edw at d/c. Titrate down as tolerated.  4. Anemia of CKD - Hgb8.4, s/p 1Unit pRBC -> 7.6 (req 2U). Aranesp 129mcggiven10/26. 2U PRBC yesterday w/ appropriate rise in H/H  5. Secondary Hyperparathyroidism -Ca and phos at goal. 6. Nutrition -Alb 2.4. Renal diet w/fluid restrictions once advanced. Nepro. Renavite.  Subjective:   No complaints. Pain well controlled. Arousable.   Objective:   BP (!) 120/57 (BP Location: Left Arm)   Pulse 86   Temp 98 F (36.7 C) (Axillary)   Resp 17   Ht 5\' 7"  (1.702 m)   Wt 47.9 kg   SpO2 99%   BMI 16.54 kg/m   Intake/Output Summary (Last 24 hours) at 01/08/2018 1315 Last data filed at 01/07/2018 2215 Gross per 24 hour  Intake -  Output 1 ml  Net -1 ml   Weight change: -2 kg  Physical Exam: General:NAD, chronically ill appearing elderly male Heart:RRR, no mrg Lungs:CTAB anteriorly. nml WOB Abdomen:soft, NTND Extremities:b/l BKA, L leg w/woundvac in place. No stump edema Dialysis Access:L thigh AVG +thrill(strong)  Imaging: No results found.  Labs: BMET Recent Labs  Lab 01/02/18 0747 01/03/18 0916 01/05/18 1304 01/06/18 1327 01/07/18 0721  NA 133* 132* 136  --  134*  K 4.7 5.4* 4.2  --  4.8  CL 96* 96* 101  --  95*  CO2 24 22 24   --  27  GLUCOSE 218* 258* 169* 341* 130*  BUN 62* 76* 64*  --  66*  CREATININE 6.86* 8.15* 7.91*  --  6.91*   CALCIUM 7.7* 7.6* 7.6*  --  7.7*  PHOS  --  3.8 3.6  --  3.5   CBC Recent Labs  Lab 01/02/18 0747 01/03/18 0916 01/05/18 0114 01/07/18 0721 01/08/18 0802  WBC 8.7 11.6* 10.0 10.2  --   HGB 7.4* 8.3* 8.8* 7.6* 10.1*  HCT 27.0* 28.1* 30.3* 26.5* 33.5*  MCV 87.9 87.3 87.1 86.6  --   PLT 217 213 221 261  --     Medications:    . amitriptyline  25 mg Oral Q2000  . aspirin EC  325 mg Oral Daily  . Chlorhexidine Gluconate Cloth  6 each Topical Q0600  . Chlorhexidine Gluconate Cloth  6 each Topical Q0600  . darbepoetin (ARANESP) injection - DIALYSIS  150 mcg Intravenous Q Sat-HD  . docusate sodium  100 mg Oral BID  . doxercalciferol  2 mcg Intravenous Q M,W,F-HD  . feeding supplement (NEPRO CARB STEADY)  237 mL Oral BID  . insulin aspart  0-9 Units Subcutaneous TID WC  . insulin glargine  8 Units Subcutaneous Q M,W,F-2000  . losartan  50 mg Oral Daily  . multivitamin  1 tablet Oral Daily  . pantoprazole  40 mg Oral BID WC  . sevelamer carbonate  1,600 mg Oral TID WC      Paulene Floor, MD 01/08/2018, 1:15 PM

## 2018-01-09 ENCOUNTER — Encounter (HOSPITAL_COMMUNITY): Payer: Self-pay | Admitting: Orthopedic Surgery

## 2018-01-09 DIAGNOSIS — R404 Transient alteration of awareness: Secondary | ICD-10-CM | POA: Diagnosis not present

## 2018-01-09 DIAGNOSIS — D62 Acute posthemorrhagic anemia: Secondary | ICD-10-CM | POA: Diagnosis not present

## 2018-01-09 DIAGNOSIS — E1122 Type 2 diabetes mellitus with diabetic chronic kidney disease: Secondary | ICD-10-CM | POA: Diagnosis not present

## 2018-01-09 DIAGNOSIS — M6281 Muscle weakness (generalized): Secondary | ICD-10-CM | POA: Diagnosis not present

## 2018-01-09 DIAGNOSIS — T45525A Adverse effect of antithrombotic drugs, initial encounter: Secondary | ICD-10-CM | POA: Diagnosis not present

## 2018-01-09 DIAGNOSIS — Z794 Long term (current) use of insulin: Secondary | ICD-10-CM | POA: Diagnosis not present

## 2018-01-09 DIAGNOSIS — Z4781 Encounter for orthopedic aftercare following surgical amputation: Secondary | ICD-10-CM | POA: Diagnosis not present

## 2018-01-09 DIAGNOSIS — E1121 Type 2 diabetes mellitus with diabetic nephropathy: Secondary | ICD-10-CM | POA: Diagnosis not present

## 2018-01-09 DIAGNOSIS — I132 Hypertensive heart and chronic kidney disease with heart failure and with stage 5 chronic kidney disease, or end stage renal disease: Secondary | ICD-10-CM | POA: Diagnosis not present

## 2018-01-09 DIAGNOSIS — E1151 Type 2 diabetes mellitus with diabetic peripheral angiopathy without gangrene: Secondary | ICD-10-CM | POA: Diagnosis not present

## 2018-01-09 DIAGNOSIS — I700 Atherosclerosis of aorta: Secondary | ICD-10-CM | POA: Diagnosis not present

## 2018-01-09 DIAGNOSIS — I120 Hypertensive chronic kidney disease with stage 5 chronic kidney disease or end stage renal disease: Secondary | ICD-10-CM | POA: Diagnosis not present

## 2018-01-09 DIAGNOSIS — Z7401 Bed confinement status: Secondary | ICD-10-CM | POA: Diagnosis not present

## 2018-01-09 DIAGNOSIS — Z8673 Personal history of transient ischemic attack (TIA), and cerebral infarction without residual deficits: Secondary | ICD-10-CM | POA: Diagnosis not present

## 2018-01-09 DIAGNOSIS — I5023 Acute on chronic systolic (congestive) heart failure: Secondary | ICD-10-CM | POA: Diagnosis not present

## 2018-01-09 DIAGNOSIS — I959 Hypotension, unspecified: Secondary | ICD-10-CM | POA: Diagnosis not present

## 2018-01-09 DIAGNOSIS — D631 Anemia in chronic kidney disease: Secondary | ICD-10-CM | POA: Diagnosis not present

## 2018-01-09 DIAGNOSIS — E1165 Type 2 diabetes mellitus with hyperglycemia: Secondary | ICD-10-CM | POA: Diagnosis not present

## 2018-01-09 DIAGNOSIS — Z89512 Acquired absence of left leg below knee: Secondary | ICD-10-CM | POA: Diagnosis not present

## 2018-01-09 DIAGNOSIS — N186 End stage renal disease: Secondary | ICD-10-CM | POA: Diagnosis not present

## 2018-01-09 DIAGNOSIS — Z89511 Acquired absence of right leg below knee: Secondary | ICD-10-CM | POA: Diagnosis not present

## 2018-01-09 DIAGNOSIS — Z992 Dependence on renal dialysis: Secondary | ICD-10-CM | POA: Diagnosis not present

## 2018-01-09 DIAGNOSIS — E11319 Type 2 diabetes mellitus with unspecified diabetic retinopathy without macular edema: Secondary | ICD-10-CM | POA: Diagnosis not present

## 2018-01-09 DIAGNOSIS — R402411 Glasgow coma scale score 13-15, in the field [EMT or ambulance]: Secondary | ICD-10-CM | POA: Diagnosis not present

## 2018-01-09 DIAGNOSIS — I214 Non-ST elevation (NSTEMI) myocardial infarction: Secondary | ICD-10-CM | POA: Diagnosis not present

## 2018-01-09 DIAGNOSIS — K219 Gastro-esophageal reflux disease without esophagitis: Secondary | ICD-10-CM | POA: Diagnosis not present

## 2018-01-09 DIAGNOSIS — E1140 Type 2 diabetes mellitus with diabetic neuropathy, unspecified: Secondary | ICD-10-CM | POA: Diagnosis not present

## 2018-01-09 DIAGNOSIS — I10 Essential (primary) hypertension: Secondary | ICD-10-CM | POA: Diagnosis not present

## 2018-01-09 DIAGNOSIS — I288 Other diseases of pulmonary vessels: Secondary | ICD-10-CM | POA: Diagnosis not present

## 2018-01-09 DIAGNOSIS — I219 Acute myocardial infarction, unspecified: Secondary | ICD-10-CM | POA: Diagnosis not present

## 2018-01-09 DIAGNOSIS — G8929 Other chronic pain: Secondary | ICD-10-CM | POA: Diagnosis not present

## 2018-01-09 DIAGNOSIS — Z79899 Other long term (current) drug therapy: Secondary | ICD-10-CM | POA: Diagnosis not present

## 2018-01-09 NOTE — Anesthesia Postprocedure Evaluation (Signed)
Anesthesia Post Note  Patient: Charles Vasquez  Procedure(s) Performed: LEFT BELOW KNEE AMPUTATION (Left Leg Lower)     Patient location during evaluation: PACU Anesthesia Type: Regional Level of consciousness: awake and alert Pain management: pain level controlled Vital Signs Assessment: post-procedure vital signs reviewed and stable Respiratory status: spontaneous breathing, nonlabored ventilation, respiratory function stable and patient connected to nasal cannula oxygen Cardiovascular status: stable and blood pressure returned to baseline Postop Assessment: no apparent nausea or vomiting Anesthetic complications: no    Last Vitals:  Vitals:   01/08/18 0629 01/08/18 1320  BP: (!) 120/57 (!) 145/69  Pulse: 86 80  Resp: 17 16  Temp: 36.7 C 36.6 C  SpO2: 99% 100%    Last Pain:  Vitals:   01/08/18 1320  TempSrc: Oral  PainSc: 0-No pain                 Cecile Hearing

## 2018-01-13 ENCOUNTER — Inpatient Hospital Stay (INDEPENDENT_AMBULATORY_CARE_PROVIDER_SITE_OTHER): Payer: Medicare Other | Admitting: Physician Assistant

## 2018-01-15 DIAGNOSIS — I252 Old myocardial infarction: Secondary | ICD-10-CM | POA: Diagnosis not present

## 2018-01-15 DIAGNOSIS — S61204A Unspecified open wound of right ring finger without damage to nail, initial encounter: Secondary | ICD-10-CM | POA: Diagnosis not present

## 2018-01-15 DIAGNOSIS — T8781 Dehiscence of amputation stump: Secondary | ICD-10-CM | POA: Diagnosis not present

## 2018-01-15 DIAGNOSIS — N2581 Secondary hyperparathyroidism of renal origin: Secondary | ICD-10-CM | POA: Diagnosis not present

## 2018-01-15 DIAGNOSIS — Z89021 Acquired absence of right finger(s): Secondary | ICD-10-CM | POA: Diagnosis not present

## 2018-01-15 DIAGNOSIS — I10 Essential (primary) hypertension: Secondary | ICD-10-CM | POA: Diagnosis not present

## 2018-01-15 DIAGNOSIS — I2510 Atherosclerotic heart disease of native coronary artery without angina pectoris: Secondary | ICD-10-CM | POA: Diagnosis not present

## 2018-01-15 DIAGNOSIS — Z794 Long term (current) use of insulin: Secondary | ICD-10-CM | POA: Diagnosis not present

## 2018-01-15 DIAGNOSIS — X58XXXA Exposure to other specified factors, initial encounter: Secondary | ICD-10-CM | POA: Diagnosis not present

## 2018-01-15 DIAGNOSIS — D638 Anemia in other chronic diseases classified elsewhere: Secondary | ICD-10-CM | POA: Diagnosis not present

## 2018-01-15 DIAGNOSIS — I132 Hypertensive heart and chronic kidney disease with heart failure and with stage 5 chronic kidney disease, or end stage renal disease: Secondary | ICD-10-CM | POA: Diagnosis not present

## 2018-01-15 DIAGNOSIS — D62 Acute posthemorrhagic anemia: Secondary | ICD-10-CM | POA: Diagnosis not present

## 2018-01-15 DIAGNOSIS — E1165 Type 2 diabetes mellitus with hyperglycemia: Secondary | ICD-10-CM | POA: Diagnosis not present

## 2018-01-15 DIAGNOSIS — M86641 Other chronic osteomyelitis, right hand: Secondary | ICD-10-CM | POA: Diagnosis not present

## 2018-01-15 DIAGNOSIS — E1152 Type 2 diabetes mellitus with diabetic peripheral angiopathy with gangrene: Secondary | ICD-10-CM | POA: Diagnosis not present

## 2018-01-15 DIAGNOSIS — E1022 Type 1 diabetes mellitus with diabetic chronic kidney disease: Secondary | ICD-10-CM | POA: Diagnosis not present

## 2018-01-15 DIAGNOSIS — I959 Hypotension, unspecified: Secondary | ICD-10-CM | POA: Diagnosis not present

## 2018-01-15 DIAGNOSIS — T8789 Other complications of amputation stump: Secondary | ICD-10-CM | POA: Diagnosis not present

## 2018-01-15 DIAGNOSIS — Z7982 Long term (current) use of aspirin: Secondary | ICD-10-CM | POA: Diagnosis not present

## 2018-01-15 DIAGNOSIS — D649 Anemia, unspecified: Secondary | ICD-10-CM | POA: Diagnosis not present

## 2018-01-15 DIAGNOSIS — M79644 Pain in right finger(s): Secondary | ICD-10-CM | POA: Diagnosis present

## 2018-01-15 DIAGNOSIS — Z7401 Bed confinement status: Secondary | ICD-10-CM | POA: Diagnosis not present

## 2018-01-15 DIAGNOSIS — M6281 Muscle weakness (generalized): Secondary | ICD-10-CM | POA: Diagnosis not present

## 2018-01-15 DIAGNOSIS — K5520 Angiodysplasia of colon without hemorrhage: Secondary | ICD-10-CM | POA: Diagnosis not present

## 2018-01-15 DIAGNOSIS — I214 Non-ST elevation (NSTEMI) myocardial infarction: Secondary | ICD-10-CM | POA: Diagnosis not present

## 2018-01-15 DIAGNOSIS — B964 Proteus (mirabilis) (morganii) as the cause of diseases classified elsewhere: Secondary | ICD-10-CM | POA: Diagnosis not present

## 2018-01-15 DIAGNOSIS — D631 Anemia in chronic kidney disease: Secondary | ICD-10-CM | POA: Diagnosis not present

## 2018-01-15 DIAGNOSIS — I5021 Acute systolic (congestive) heart failure: Secondary | ICD-10-CM | POA: Diagnosis not present

## 2018-01-15 DIAGNOSIS — Z888 Allergy status to other drugs, medicaments and biological substances status: Secondary | ICD-10-CM | POA: Diagnosis not present

## 2018-01-15 DIAGNOSIS — R109 Unspecified abdominal pain: Secondary | ICD-10-CM | POA: Diagnosis not present

## 2018-01-15 DIAGNOSIS — Z4781 Encounter for orthopedic aftercare following surgical amputation: Secondary | ICD-10-CM | POA: Diagnosis not present

## 2018-01-15 DIAGNOSIS — T879 Unspecified complications of amputation stump: Secondary | ICD-10-CM | POA: Diagnosis not present

## 2018-01-15 DIAGNOSIS — Z8673 Personal history of transient ischemic attack (TIA), and cerebral infarction without residual deficits: Secondary | ICD-10-CM | POA: Diagnosis not present

## 2018-01-15 DIAGNOSIS — Z79899 Other long term (current) drug therapy: Secondary | ICD-10-CM | POA: Diagnosis not present

## 2018-01-15 DIAGNOSIS — E1142 Type 2 diabetes mellitus with diabetic polyneuropathy: Secondary | ICD-10-CM | POA: Diagnosis not present

## 2018-01-15 DIAGNOSIS — S61202A Unspecified open wound of right middle finger without damage to nail, initial encounter: Secondary | ICD-10-CM | POA: Diagnosis not present

## 2018-01-15 DIAGNOSIS — Z8249 Family history of ischemic heart disease and other diseases of the circulatory system: Secondary | ICD-10-CM | POA: Diagnosis not present

## 2018-01-15 DIAGNOSIS — Z992 Dependence on renal dialysis: Secondary | ICD-10-CM | POA: Diagnosis not present

## 2018-01-15 DIAGNOSIS — E785 Hyperlipidemia, unspecified: Secondary | ICD-10-CM | POA: Diagnosis not present

## 2018-01-15 DIAGNOSIS — E119 Type 2 diabetes mellitus without complications: Secondary | ICD-10-CM | POA: Diagnosis not present

## 2018-01-15 DIAGNOSIS — Z833 Family history of diabetes mellitus: Secondary | ICD-10-CM | POA: Diagnosis not present

## 2018-01-15 DIAGNOSIS — E1122 Type 2 diabetes mellitus with diabetic chronic kidney disease: Secondary | ICD-10-CM | POA: Diagnosis not present

## 2018-01-15 DIAGNOSIS — M868X4 Other osteomyelitis, hand: Secondary | ICD-10-CM | POA: Diagnosis not present

## 2018-01-15 DIAGNOSIS — S88111A Complete traumatic amputation at level between knee and ankle, right lower leg, initial encounter: Secondary | ICD-10-CM | POA: Diagnosis not present

## 2018-01-15 DIAGNOSIS — I5023 Acute on chronic systolic (congestive) heart failure: Secondary | ICD-10-CM | POA: Diagnosis not present

## 2018-01-15 DIAGNOSIS — I21A9 Other myocardial infarction type: Secondary | ICD-10-CM | POA: Diagnosis not present

## 2018-01-15 DIAGNOSIS — I96 Gangrene, not elsewhere classified: Secondary | ICD-10-CM | POA: Diagnosis not present

## 2018-01-15 DIAGNOSIS — N186 End stage renal disease: Secondary | ICD-10-CM | POA: Diagnosis not present

## 2018-01-15 DIAGNOSIS — Y835 Amputation of limb(s) as the cause of abnormal reaction of the patient, or of later complication, without mention of misadventure at the time of the procedure: Secondary | ICD-10-CM | POA: Diagnosis not present

## 2018-01-15 DIAGNOSIS — K219 Gastro-esophageal reflux disease without esophagitis: Secondary | ICD-10-CM | POA: Diagnosis not present

## 2018-01-15 DIAGNOSIS — I120 Hypertensive chronic kidney disease with stage 5 chronic kidney disease or end stage renal disease: Secondary | ICD-10-CM | POA: Diagnosis not present

## 2018-01-15 DIAGNOSIS — K921 Melena: Secondary | ICD-10-CM | POA: Diagnosis not present

## 2018-01-15 DIAGNOSIS — E1036 Type 1 diabetes mellitus with diabetic cataract: Secondary | ICD-10-CM | POA: Diagnosis not present

## 2018-01-15 DIAGNOSIS — M79641 Pain in right hand: Secondary | ICD-10-CM | POA: Diagnosis not present

## 2018-01-15 DIAGNOSIS — M869 Osteomyelitis, unspecified: Secondary | ICD-10-CM | POA: Diagnosis not present

## 2018-01-15 DIAGNOSIS — Z89511 Acquired absence of right leg below knee: Secondary | ICD-10-CM | POA: Diagnosis not present

## 2018-01-15 DIAGNOSIS — Z89512 Acquired absence of left leg below knee: Secondary | ICD-10-CM | POA: Diagnosis not present

## 2018-01-15 DIAGNOSIS — E1169 Type 2 diabetes mellitus with other specified complication: Secondary | ICD-10-CM | POA: Diagnosis not present

## 2018-01-16 DIAGNOSIS — I21A9 Other myocardial infarction type: Secondary | ICD-10-CM | POA: Diagnosis not present

## 2018-01-16 DIAGNOSIS — N186 End stage renal disease: Secondary | ICD-10-CM | POA: Diagnosis not present

## 2018-01-16 DIAGNOSIS — I5021 Acute systolic (congestive) heart failure: Secondary | ICD-10-CM | POA: Diagnosis not present

## 2018-01-16 DIAGNOSIS — D638 Anemia in other chronic diseases classified elsewhere: Secondary | ICD-10-CM | POA: Diagnosis not present

## 2018-01-16 DIAGNOSIS — Z992 Dependence on renal dialysis: Secondary | ICD-10-CM | POA: Diagnosis not present

## 2018-01-19 ENCOUNTER — Encounter: Payer: Self-pay | Admitting: Gastroenterology

## 2018-01-19 DIAGNOSIS — N186 End stage renal disease: Secondary | ICD-10-CM | POA: Diagnosis not present

## 2018-01-19 DIAGNOSIS — Z992 Dependence on renal dialysis: Secondary | ICD-10-CM | POA: Diagnosis not present

## 2018-01-20 ENCOUNTER — Encounter (INDEPENDENT_AMBULATORY_CARE_PROVIDER_SITE_OTHER): Payer: Self-pay | Admitting: Physician Assistant

## 2018-01-20 ENCOUNTER — Ambulatory Visit (INDEPENDENT_AMBULATORY_CARE_PROVIDER_SITE_OTHER): Payer: Medicare Other | Admitting: Physician Assistant

## 2018-01-20 VITALS — Ht 67.0 in | Wt 105.0 lb

## 2018-01-20 DIAGNOSIS — N186 End stage renal disease: Secondary | ICD-10-CM

## 2018-01-20 DIAGNOSIS — Z89511 Acquired absence of right leg below knee: Secondary | ICD-10-CM

## 2018-01-20 DIAGNOSIS — Z89512 Acquired absence of left leg below knee: Secondary | ICD-10-CM

## 2018-01-20 DIAGNOSIS — E43 Unspecified severe protein-calorie malnutrition: Secondary | ICD-10-CM

## 2018-01-20 NOTE — Progress Notes (Signed)
Office Visit Note   Patient: Charles Vasquez           Date of Birth: 1935/08/20           MRN: [id number] Visit Date: 01/20/2018              Requested by: Toma Deiters, MD [address] [place], [place] [address] PCP: Toma Deiters, MD  Chief Complaint  Patient presents with  . Left Leg - Routine Post Op    Left BKA  01/02/18 prevena and restore vac no shrinker.       HPI: The patient is a 82 yo gentleman here with his family who is seen for post operative follow up following left transtibial amputation on 01/02/18 with Prevena VAC placement. He is at [facility] for rehab. He reports minimal pain over the residual limb. He reports not eating that well and does not like the Glucerna supplements and we discussed the importance of good protein intake to heal.   Assessment & Plan: Visit Diagnoses:  1. Acquired absence of leg below knee, left (HCC)   2. History of right below knee amputation (HCC)   3. Severe protein-calorie malnutrition (HCC)   4. End stage renal disease (HCC)     Plan: The Prevena VAC was removed . May wash the residual limbs with soap and water and needs bilateral Stump shrinker stockings and given orders for [employer] to supply. Orders sent to SNF for the above and for follow up in 1 week.   Follow-Up Instructions: Return in about 1 week (around 01/27/2018).   Ortho Exam  Patient is alert, oriented, no adenopathy, well-dressed, normal affect, normal respiratory effort. Presents in wheelchair with family.  Prevena VAC was removed from the left transtibial amputation site and the staples are intact over the amputation site. There is epidermolysis over the distal stump and some erythema and duskiness of the peri incisional area centrally. No cellulitis. Passively can get to full extension and has good flexion.  The right BKA lacks ~ 10 degrees of extension and has a small ulcer ~ 1cm over the tibial tuberosity. No signs of infection or cellulitis.    Imaging: No results found.   Labs: Lab Results  Component Value Date   HGBA1C 6.6 (H) 10/08/2017   HGBA1C 7.1 (H) 12/24/2016   HGBA1C 8.8 (H) 08/10/2016   ESRSEDRATE 110 (H) 07/12/2017   CRP 21.4 (H) 07/12/2017   REPTSTATUS 07/17/2017 FINAL 07/12/2017   GRAMSTAIN  08/04/2016    RARE WBC PRESENT, PREDOMINANTLY PMN FEW GRAM NEGATIVE RODS FEW GRAM NEGATIVE DIPLOCOCCI RARE GRAM POSITIVE COCCI IN CLUSTERS    CULT  07/12/2017    NO GROWTH 5 DAYS Performed at [facility], [address], [place], [place] [address]    [name] PROTEUS SPECIES 07/12/2017   LABORGA STREPTOCOCCUS PARASANGUINIS 07/12/2017     Lab Results  Component Value Date   ALBUMIN 2.1 (L) 01/07/2018   ALBUMIN 2.0 (L) 01/05/2018   ALBUMIN 2.4 (L) 01/03/2018    Body mass index is 16.45 kg/m.  Orders:  No orders of the defined types were placed in this encounter.  No orders of the defined types were placed in this encounter.    Procedures: No procedures performed  Clinical Data: No additional findings.  ROS:  All other systems negative, except as noted in the HPI. Review of Systems  Objective: Vital Signs: Ht 5\' 7"  (1.702 m)   Wt 105 lb (47.6 kg)  BMI 16.45 kg/m   Specialty Comments:  No specialty comments available.  PMFS History: Patient Active Problem List   Diagnosis Date Noted  . Left below-knee amputee (HCC) 01/02/2018  . Gangrene of left foot (HCC)   . Osteomyelitis of finger of right hand (HCC) 09/24/2017  . Severe protein-calorie malnutrition (HCC) 09/16/2017  . 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 Labile blood pressure   . Chronic left shoulder pain   . Bacteremia   . Labile blood glucose   . Hypertensive crisis   . Hypoglycemia   . Leukocytosis   . Acute blood loss anemia   . Anemia of chronic disease   . ESRD on dialysis (HCC)   . Type 2 diabetes mellitus with peripheral neuropathy (HCC)   . Benign  essential HTN   . 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 Pressure injury of skin 07/13/2017  . Diabetic foot infection (HCC) 07/12/2017  . CAD (coronary artery disease) 07/12/2017  . Type II diabetes mellitus with renal manifestations (HCC) 07/12/2017  . Wound of right foot   . Loose stools 05/20/2017  . Abdominal pain   . Loss of weight 03/18/2017  . Abnormal CT scan, colon 03/18/2017  . Melena 03/18/2017  . Periumbilical abdominal pain 03/18/2017  . ESRD (end stage renal disease) on dialysis (HCC) 10/22/2016  . Venous hypertension status post AVG procedure 08/10/2016  . Anasarca 08/04/2016  . Facial swelling 08/04/2016  . Effusion, other site 08/04/2016  . 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 Acute respiratory failure (HCC) 08/04/2016  . Hyperkalemia 08/04/2016  . Aftercare following surgery of the circulatory system, NEC-Right  AVGG 09/25/2012  . Other complications due to renal dialysis device, implant, and graft 05/01/2012  . End stage renal disease (HCC) 03/17/2012  . FATIGUE, ACUTE 08/22/2008  . HEMATURIA UNSPECIFIED 07/11/2008  . Diabetes (HCC) 01/19/2008  . Essential hypertension 01/19/2008  . MYOCARDIAL INFARCTION, HX OF 01/19/2008  . GERD 01/19/2008  . OSTEOARTHRITIS 01/19/2008  . CEREBROVASCULAR ACCIDENT, HX OF 01/19/2008   Past Medical History:  Diagnosis Date  . Anal pain   . AVF (arteriovenous fistula) (HCC) 05-13-2017 per pt currently AVF access used is left thigh   hx multiple AVF surgery's (previously left upper arm, bilateral thigh) last surgery -- right upper arm creation 11-26-2016  . ESRD (end stage renal disease) on dialysis [facility])    [facility] in [place], [place] on MWF   . Gangrene (HCC)    left leg  . History of CVA (cerebrovascular accident)    05-13-2017  per pt stated "thats what they told yrs ago"  per pt no residual  . Hyperlipidemia   . Hypertension   . Myocardial infarction (HCC)   . Stroke (HCC)     . Type 2 diabetes mellitus treated with insulin (HCC)    Type II    Family History  Problem Relation Age of Onset  . Diabetes Mother   . Hypertension Mother   . AAA (abdominal aortic aneurysm) Mother   . Diabetes Father   . Hypertension Father     Past Surgical History:  Procedure Laterality Date  . ABDOMINAL AORTOGRAM W/LOWER EXTREMITY N/A 06/25/2017   Procedure: ABDOMINAL AORTOGRAM W/LOWER EXTREMITY;  Surgeon: Maeola Harman, MD;  Location: [facility] INVASIVE CV LAB;  Service: Cardiovascular;  Laterality: N/A;  . AMPUTATION Right 01/01/2017   Procedure: REVISION AMPUTATION RIGHT INDEX FINGER;  Surgeon: Dairl Ponder, MD;  [facility] OR;  Service: Orthopedics;  Laterality: Right;  . AMPUTATION Right 07/15/2017   Procedure: AMPUTATION BELOW KNEE RIGHT;  Surgeon: [name], MD;  Location: [facility] OR;  Service: Vascular;  Laterality: Right;  . AMPUTATION Right 10/08/2017   Procedure: RIGHT INDEX, RIGHT LONG REVISION AMPUTATIONS;  Surgeon: Dairl Ponder, MD;  Location: [facility] OR;  Service: Orthopedics;  Laterality: Right;  . AMPUTATION Left 01/02/2018   Procedure: LEFT BELOW KNEE AMPUTATION;  Surgeon: Nadara Mustard, MD;  Location: [facility] OR;  Service: Orthopedics;  Laterality: Left;  . ARTERIOVENOUS GRAFT PLACEMENT    . ARTERIOVENOUS GRAFT PLACEMENT Left 10/22/2016   thigh  . AV FISTULA PLACEMENT  03/31/2012   Procedure: ARTERIOVENOUS (AV) FISTULA CREATION;  Surgeon: [name], MD;  Location: [facility] OR;  Service: Vascular;  Laterality: Right;  First stage Brachial vein transposition  . AV FISTULA PLACEMENT Right 08/25/2012   Procedure: INSERTION OF ARTERIOVENOUS (AV) GORE-TEX GRAFT ARM;  Surgeon: [name], MD;  [facility]: [facility] OR;  Service: Vascular;  Laterality: Right;  . AV FISTULA PLACEMENT Left 07/23/2016   Procedure: INSERTION OF ARTERIOVENOUS (AV) GORE-TEX GRAFT LEFT UPPER ARM;  Surgeon: [name], MD;  Location: [facility] OR;  Service: Vascular;  Laterality: Left;  . AV FISTULA  PLACEMENT Left 10/22/2016   Procedure: INSERTION OF 4-70mm x 45cm  ARTERIOVENOUS (AV) GORE-TEX GRAFT THIGH-LEFT;  Surgeon: [name], MD;  Location: [facility] OR;  Service: Vascular;  Laterality: Left;  . CATARACT EXTRACTION W/ INTRAOCULAR LENS  IMPLANT, BILATERAL    . COLONOSCOPY N/A 03/25/2017   Dr. Darrick Penna: examined portion of ileum normal. Redundant left colon, stricture at anus   . ESOPHAGOGASTRODUODENOSCOPY N/A 03/25/2017   Dr. Darrick Penna: widely patent Schatzki ring at GE junction s/p dilation, atrophic gastritis, single duodenal AVM, non-bleeding  . INSERTION OF DIALYSIS CATHETER Left 05/05/2012   Procedure: INSERTION OF DIALYSIS CATHETER;  Surgeon: Chuck Hint, MD;  Location: [facility] OR;  Service: Vascular;  Laterality: Left;  . INSERTION OF DIALYSIS CATHETER Right 10/01/2016   Procedure: INSERTION OF DIALYSIS CATHETER- RIGHT FEMORAL;  Surgeon: [name], MD;  Location: [facility] OR;  Service: Vascular;  Laterality: Right;  . IR FLUORO GUIDE CV LINE RIGHT  06/10/2016  . IR GENERIC HISTORICAL  06/07/2016   IR US GUIDE VASC ACCESS RIGHT 06/07/2016 Oley Balm, MD [facility]-INTER[facility] RAD  . IR THROMBECTOMY AV FISTULA W/THROMBOLYSIS/PTA INC/SHUNT/IMG RIGHT Right 06/07/2016  . IR US GUIDE VASC ACCESS RIGHT  06/10/2016  . LIGATION ARTERIOVENOUS GORTEX GRAFT Left 08/04/2016   Procedure: LIGATION ARTERIOVENOUS GORTEX GRAFT;  Surgeon: Larina Earthly, MD;  Location: [facility] OR;  Service: Vascular;  Laterality: Left;  . LIGATION ARTERIOVENOUS GORTEX GRAFT Right 11/26/2016   Procedure: LIGATION OF RIGHT UPPER ARM ARTERIOVENOUS GORTEX GRAFT;  Surgeon: [name], MD;  Location: [facility] OR;  Service: Vascular;  Laterality: Right;  . LIGATION OF ARTERIOVENOUS  FISTULA Right 08/25/2012   Procedure: LIGATION OF ARTERIOVENOUS  FISTULA;  Surgeon: [name], MD;  Location: [facility] OR;  Service: Vascular;  Laterality: Right;  Ligation of right brachial vein transposition  . PERIPHERAL VASCULAR ATHERECTOMY  06/25/2017   Procedure: PERIPHERAL  VASCULAR ATHERECTOMY;  Surgeon: Maeola Harman, MD;  Location: [facility] INVASIVE CV LAB;  Service: Cardiovascular;;  Rt. PT  . REMOVAL OF A DIALYSIS CATHETER Right 05/05/2012   Procedure: REMOVAL OF A DIALYSIS CATHETER;  Surgeon: Chuck Hint, MD;  Location: [facility] OR;  Service: Vascular;  Laterality: Right;  . REMOVAL OF  A DIALYSIS CATHETER Right 10/01/2016   Procedure: REMOVAL OF A DIALYSIS CATHETER-RIGHT UPPER CHEST;  Surgeon: [name], MD;  Location: [facility] OR;  Service: Vascular;  Laterality: Right;  . REMOVAL OF A DIALYSIS CATHETER Right 11/26/2016   Procedure: REMOVAL OF RIGHT FEMORAL TUNNEL DIALYSIS CATHETER;  Surgeon: [name], MD;  Location: [facility] OR;  Service: Vascular;  Laterality: Right;  . SPHINCTEROTOMY N/A 05/15/2017   Procedure: POSSIBLE SPHINCTEROTOMY (BOTOX);  Surgeon: Romie Levee, MD;  Location: [facility];  Service: General;  Laterality: N/A;  . TOE AMPUTATION  02/2007   left foot first 3 toes  . UPPER EXTREMITY VENOGRAPHY Bilateral 07/18/2016   Procedure: Bilateral Upper Extremity Venography;  Surgeon: [name], MD;  Location: [facility] INVASIVE CV LAB;  Service: Cardiovascular;  Laterality: Bilateral;   Social History   Occupational History  . Not on file  Tobacco Use  . Smoking status: Never Smoker  . Smokeless tobacco: Never Used  Substance and Sexual Activity  . Alcohol use: No  . Drug use: No  . Sexual activity: Yes

## 2018-01-21 DIAGNOSIS — Z992 Dependence on renal dialysis: Secondary | ICD-10-CM | POA: Diagnosis not present

## 2018-01-21 DIAGNOSIS — N186 End stage renal disease: Secondary | ICD-10-CM | POA: Diagnosis not present

## 2018-01-22 ENCOUNTER — Telehealth: Payer: Self-pay

## 2018-01-22 NOTE — Telephone Encounter (Signed)
I spoke to Wynne Dust, NP in Dr. Evelina Dun absence. He agreed that pt should go to the ED since he has been having black stool for several weeks.

## 2018-01-22 NOTE — Telephone Encounter (Signed)
Pt's sister, Hilda Lias, called and said pt has had dark stools for several weeks. He also had amputation recently. He had been referred by Dr. Fausto Skillern for positive blood in stool. I told Hilda Lias she should take him to ED. She wants to hold out and come in for appt in Dec. I have called and left Vm for Dr. Darrick Penna on cell.  Marie's call back number is [phone].

## 2018-01-23 DIAGNOSIS — N186 End stage renal disease: Secondary | ICD-10-CM | POA: Diagnosis not present

## 2018-01-23 DIAGNOSIS — Z992 Dependence on renal dialysis: Secondary | ICD-10-CM | POA: Diagnosis not present

## 2018-01-23 NOTE — Telephone Encounter (Signed)
REVIEWED. AGREE. NO ADDITIONAL RECOMMENDATIONS. 

## 2018-01-26 DIAGNOSIS — N186 End stage renal disease: Secondary | ICD-10-CM | POA: Diagnosis not present

## 2018-01-26 DIAGNOSIS — Z992 Dependence on renal dialysis: Secondary | ICD-10-CM | POA: Diagnosis not present

## 2018-01-27 ENCOUNTER — Ambulatory Visit (INDEPENDENT_AMBULATORY_CARE_PROVIDER_SITE_OTHER): Payer: Medicare Other | Admitting: Physician Assistant

## 2018-01-27 ENCOUNTER — Encounter (INDEPENDENT_AMBULATORY_CARE_PROVIDER_SITE_OTHER): Payer: Self-pay | Admitting: Physician Assistant

## 2018-01-27 VITALS — Ht 67.0 in | Wt 105.0 lb

## 2018-01-27 DIAGNOSIS — Z89511 Acquired absence of right leg below knee: Secondary | ICD-10-CM

## 2018-01-27 DIAGNOSIS — Z89512 Acquired absence of left leg below knee: Secondary | ICD-10-CM

## 2018-01-27 DIAGNOSIS — N186 End stage renal disease: Secondary | ICD-10-CM

## 2018-01-27 DIAGNOSIS — E43 Unspecified severe protein-calorie malnutrition: Secondary | ICD-10-CM

## 2018-01-27 NOTE — Progress Notes (Signed)
Office Visit Note   Patient: Charles Vasquez           Date of Birth: March 15, 1935           MRN: [id number] Visit Date: 01/27/2018              Requested by: Toma Deiters, MD [address] [place], [place] [address] PCP: Toma Deiters, MD  Chief Complaint  Patient presents with  . Left Knee - Routine Post Op    Left BKA      HPI: The patient is a 82 yo male here with his family for post operative follow up following left transtibial amputation 01/02/18 . He reports some increased pain over the residual limb over the past couple of days.  He is in rehab at [facility] in skilled nursing. He reports he is trying to drink the Glucerna protein supplements. He notes some minimal drainage from the incision but no odor or increased swelling. The nursing home has not obtained any stump shrinker stockings and they were given another prescription for this and for his prosthesis. He is going to [employer] after this office visit.       Assessment & Plan: Visit Diagnoses:  1. Acquired absence of leg below knee, left (HCC)   2. History of right below knee amputation (HCC)   3. Severe protein-calorie malnutrition (HCC)   4. End stage renal disease (HCC)     Plan: Will start Doxycycline 100 mg BID x 14 days, orders for this to nursing home. The family/patient were given another prescription to go to [employer] for K[employer] prosthesis and supplies and stump shrinker stockings and they are going to go by there today. Orders sent to the Nursing facility to wash the residual limb with soap and water and then apply the stump shrinker stocking directly on the residual limb and leave on except for showering.  Follow up in 2 weeks.   Follow-Up Instructions: Return in about 2 weeks (around 02/10/2018).   Ortho Exam  Patient is alert, oriented, no adenopathy, well-dressed, normal affect, normal respiratory effort. The left transtibial amputation incision is healing well except for slight  separation/widening centrally and slight drainage. Slight increased warmth and edema over the residual limb.   Imaging: No results found. No images are attached to the encounter.  Labs: Lab Results  Component Value Date   HGBA1C 6.6 (H) 10/08/2017   HGBA1C 7.1 (H) 12/24/2016   HGBA1C 8.8 (H) 08/10/2016   ESRSEDRATE 110 (H) 07/12/2017   CRP 21.4 (H) 07/12/2017   REPTSTATUS 07/17/2017 FINAL 07/12/2017   GRAMSTAIN  08/04/2016    RARE WBC PRESENT, PREDOMINANTLY PMN FEW GRAM NEGATIVE RODS FEW GRAM NEGATIVE DIPLOCOCCI RARE GRAM POSITIVE COCCI IN CLUSTERS    CULT  07/12/2017    NO GROWTH 5 DAYS Performed at [facility], [address] [place], [place] [address]    [name] PROTEUS SPECIES 07/12/2017   LABORGA STREPTOCOCCUS PARASANGUINIS 07/12/2017     Lab Results  Component Value Date   ALBUMIN 2.1 (L) 01/07/2018   ALBUMIN 2.0 (L) 01/05/2018   ALBUMIN 2.4 (L) 01/03/2018    Body mass index is 16.45 kg/m.  Orders:  No orders of the defined types were placed in this encounter.  No orders of the defined types were placed in this encounter.    Procedures: No procedures performed  Clinical Data: No additional findings.  ROS:  All other systems negative, except as noted in the HPI. Review  of Systems  Objective: Vital Signs: Ht 5\' 7"  (1.702 m)   Wt 105 lb (47.6 kg)   BMI 16.45 kg/m   Specialty Comments:  No specialty comments available.  PMFS History: Patient Active Problem List   Diagnosis Date Noted  . Left below-knee amputee (HCC) 01/02/2018  . Gangrene of left foot (HCC)   . Osteomyelitis of finger of right hand (HCC) 09/24/2017  . Severe protein-calorie malnutrition (HCC) 09/16/2017  . 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 Labile blood pressure   . Chronic left shoulder pain   . Bacteremia   . Labile blood glucose   . Hypertensive crisis   . Hypoglycemia   . Leukocytosis   . Acute  blood loss anemia   . Anemia of chronic disease   . ESRD on dialysis (HCC)   . Type 2 diabetes mellitus with peripheral neuropathy (HCC)   . Benign essential HTN   . 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 Pressure injury of skin 07/13/2017  . Diabetic foot infection (HCC) 07/12/2017  . CAD (coronary artery disease) 07/12/2017  . Type II diabetes mellitus with renal manifestations (HCC) 07/12/2017  . Wound of right foot   . Loose stools 05/20/2017  . Abdominal pain   . Loss of weight 03/18/2017  . Abnormal CT scan, colon 03/18/2017  . Melena 03/18/2017  . Periumbilical abdominal pain 03/18/2017  . ESRD (end stage renal disease) on dialysis (HCC) 10/22/2016  . Venous hypertension status post AVG procedure 08/10/2016  . Anasarca 08/04/2016  . Facial swelling 08/04/2016  . Effusion, other site 08/04/2016  . 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 Acute respiratory failure (HCC) 08/04/2016  . Hyperkalemia 08/04/2016  . Aftercare following surgery of the circulatory system, NEC-Right  AVGG 09/25/2012  . Other complications due to renal dialysis device, implant, and graft 05/01/2012  . End stage renal disease (HCC) 03/17/2012  . FATIGUE, ACUTE 08/22/2008  . HEMATURIA UNSPECIFIED 07/11/2008  . Diabetes (HCC) 01/19/2008  . Essential hypertension 01/19/2008  . MYOCARDIAL INFARCTION, HX OF 01/19/2008  . GERD 01/19/2008  . OSTEOARTHRITIS 01/19/2008  . CEREBROVASCULAR ACCIDENT, HX OF 01/19/2008   Past Medical History:  Diagnosis Date  . Anal pain   . AVF (arteriovenous fistula) (HCC) 05-13-2017 per pt currently AVF access used is left thigh   hx multiple AVF surgery's (previously left upper arm, bilateral thigh) last surgery -- right upper arm creation 11-26-2016  . ESRD (end stage renal disease) on dialysis [facility])    [facility] in [place], [place] on MWF   . Gangrene (HCC)    left leg  . History of CVA (cerebrovascular accident)    05-13-2017  per  pt stated "thats what they told yrs ago"  per pt no residual  . Hyperlipidemia   . Hypertension   . Myocardial infarction (HCC)   . Stroke (HCC)   . Type 2 diabetes mellitus treated with insulin (HCC)    Type II    Family History  Problem Relation Age of Onset  . Diabetes Mother   . Hypertension Mother   . AAA (abdominal aortic aneurysm) Mother   . Diabetes Father   . Hypertension Father     Past Surgical History:  Procedure Laterality Date  . ABDOMINAL AORTOGRAM W/LOWER EXTREMITY N/A 06/25/2017   Procedure: ABDOMINAL AORTOGRAM W/LOWER EXTREMITY;  Surgeon: Maeola Harman, MD;  Location: [facility] INVASIVE CV LAB;  Service: Cardiovascular;  Laterality: N/A;  .  AMPUTATION Right 01/01/2017   Procedure: REVISION AMPUTATION RIGHT INDEX FINGER;  Surgeon: Dairl Ponder, MD;  Location: [facility] OR;  Service: Orthopedics;  Laterality: Right;  . AMPUTATION Right 07/15/2017   Procedure: AMPUTATION BELOW KNEE RIGHT;  Surgeon: [name], MD;  Location: [facility] OR;  Service: Vascular;  Laterality: Right;  . AMPUTATION Right 10/08/2017   Procedure: RIGHT INDEX, RIGHT LONG REVISION AMPUTATIONS;  Surgeon: Dairl Ponder, MD;  Location: [facility] OR;  Service: Orthopedics;  Laterality: Right;  . AMPUTATION Left 01/02/2018   Procedure: LEFT BELOW KNEE AMPUTATION;  Surgeon: Nadara Mustard, MD;  Location: [facility] OR;  Service: Orthopedics;  Laterality: Left;  . ARTERIOVENOUS GRAFT PLACEMENT    . ARTERIOVENOUS GRAFT PLACEMENT Left 10/22/2016   thigh  . AV FISTULA PLACEMENT  03/31/2012   Procedure: ARTERIOVENOUS (AV) FISTULA CREATION;  Surgeon: [name], MD;  Location: [facility] OR;  Service: Vascular;  Laterality: Right;  First stage Brachial vein transposition  . AV FISTULA PLACEMENT Right 08/25/2012   Procedure: INSERTION OF ARTERIOVENOUS (AV) GORE-TEX GRAFT ARM;  Surgeon: [name], MD;  Location: [facility] OR;  Service: Vascular;  Laterality: Right;  . AV FISTULA PLACEMENT Left 07/23/2016   Procedure: INSERTION OF  ARTERIOVENOUS (AV) GORE-TEX GRAFT LEFT UPPER ARM;  Surgeon: [name], MD;  Location: [facility] OR;  Service: Vascular;  Laterality: Left;  . AV FISTULA PLACEMENT Left 10/22/2016   Procedure: INSERTION OF 4-15mm x 45cm  ARTERIOVENOUS (AV) GORE-TEX GRAFT THIGH-LEFT;  Surgeon: [name], MD;  Location: [facility] OR;  Service: Vascular;  Laterality: Left;  . CATARACT EXTRACTION W/ INTRAOCULAR LENS  IMPLANT, BILATERAL    . COLONOSCOPY N/A 03/25/2017   Dr. Darrick Penna: examined portion of ileum normal. Redundant left colon, stricture at anus   . ESOPHAGOGASTRODUODENOSCOPY N/A 03/25/2017   Dr. Darrick Penna: widely patent Schatzki ring at GE junction s/p dilation, atrophic gastritis, single duodenal AVM, non-bleeding  . INSERTION OF DIALYSIS CATHETER Left 05/05/2012   Procedure: INSERTION OF DIALYSIS CATHETER;  Surgeon: Chuck Hint, MD;  Location: [facility] OR;  Service: Vascular;  Laterality: Left;  . INSERTION OF DIALYSIS CATHETER Right 10/01/2016   Procedure: INSERTION OF DIALYSIS CATHETER- RIGHT FEMORAL;  Surgeon: [name], MD;  Location: [facility] OR;  Service: Vascular;  Laterality: Right;  . IR FLUORO GUIDE CV LINE RIGHT  06/10/2016  . IR GENERIC HISTORICAL  06/07/2016   IR US GUIDE VASC ACCESS RIGHT 06/07/2016 Oley Balm, MD [facility]-INTER[facility]  . IR THROMBECTOMY AV FISTULA W/THROMBOLYSIS/PTA INC/SHUNT/IMG RIGHT Right 06/07/2016  . IR US GUIDE VASC ACCESS RIGHT  06/10/2016  . LIGATION ARTERIOVENOUS GORTEX GRAFT Left 08/04/2016   Procedure: LIGATION ARTERIOVENOUS GORTEX GRAFT;  Surgeon: Larina Earthly, MD;  Location: [facility] OR;  Service: Vascular;  Laterality: Left;  . LIGATION ARTERIOVENOUS GORTEX GRAFT Right 11/26/2016   Procedure: LIGATION OF RIGHT UPPER ARM ARTERIOVENOUS GORTEX GRAFT;  Surgeon: [name], MD;  Location: [facility] OR;  Service: Vascular;  Laterality: Right;  . LIGATION OF ARTERIOVENOUS  FISTULA Right 08/25/2012   Procedure: LIGATION OF ARTERIOVENOUS  FISTULA;  Surgeon: [name], MD;  Location: [facility] OR;  Service:  Vascular;  Laterality: Right;  Ligation of right brachial vein transposition  . PERIPHERAL VASCULAR ATHERECTOMY  06/25/2017   Procedure: PERIPHERAL VASCULAR ATHERECTOMY;  Surgeon: Maeola Harman, MD;  Location: [facility] INVASIVE CV LAB;  Service: Cardiovascular;;  Rt. PT  . REMOVAL OF A DIALYSIS CATHETER Right 05/05/2012   Procedure: REMOVAL OF A DIALYSIS CATHETER;  Surgeon:  Chuck Hint, MD;  Location: [facility] OR;  Service: Vascular;  Laterality: Right;  . REMOVAL OF A DIALYSIS CATHETER Right 10/01/2016   Procedure: REMOVAL OF A DIALYSIS CATHETER-RIGHT UPPER CHEST;  Surgeon: [name], MD;  Location: [facility] OR;  Service: Vascular;  Laterality: Right;  . REMOVAL OF A DIALYSIS CATHETER Right 11/26/2016   Procedure: REMOVAL OF RIGHT FEMORAL TUNNEL DIALYSIS CATHETER;  Surgeon: [name], MD;  Location: [facility] OR;  Service: Vascular;  Laterality: Right;  . SPHINCTEROTOMY N/A 05/15/2017   Procedure: POSSIBLE SPHINCTEROTOMY (BOTOX);  Surgeon: Romie Levee, MD;  Location: [facility];  Service: General;  Laterality: N/A;  . TOE AMPUTATION  02/2007   left foot first 3 toes  . UPPER EXTREMITY VENOGRAPHY Bilateral 07/18/2016   Procedure: Bilateral Upper Extremity Venography;  Surgeon: [name], MD;  Location: [facility] INVASIVE CV LAB;  Service: Cardiovascular;  Laterality: Bilateral;   Social History   Occupational History  . Not on file  Tobacco Use  . Smoking status: Never Smoker  . Smokeless tobacco: Never Used  Substance and Sexual Activity  . Alcohol use: No  . Drug use: No  . Sexual activity: Yes

## 2018-01-28 DIAGNOSIS — Z992 Dependence on renal dialysis: Secondary | ICD-10-CM | POA: Diagnosis not present

## 2018-01-28 DIAGNOSIS — N186 End stage renal disease: Secondary | ICD-10-CM | POA: Diagnosis not present

## 2018-01-29 DIAGNOSIS — M79641 Pain in right hand: Secondary | ICD-10-CM | POA: Diagnosis not present

## 2018-01-29 DIAGNOSIS — I96 Gangrene, not elsewhere classified: Secondary | ICD-10-CM | POA: Diagnosis not present

## 2018-01-30 DIAGNOSIS — Z992 Dependence on renal dialysis: Secondary | ICD-10-CM | POA: Diagnosis not present

## 2018-01-30 DIAGNOSIS — N186 End stage renal disease: Secondary | ICD-10-CM | POA: Diagnosis not present

## 2018-02-02 DIAGNOSIS — Z992 Dependence on renal dialysis: Secondary | ICD-10-CM | POA: Diagnosis not present

## 2018-02-02 DIAGNOSIS — N186 End stage renal disease: Secondary | ICD-10-CM | POA: Diagnosis not present

## 2018-02-04 DIAGNOSIS — N186 End stage renal disease: Secondary | ICD-10-CM | POA: Diagnosis not present

## 2018-02-04 DIAGNOSIS — Z992 Dependence on renal dialysis: Secondary | ICD-10-CM | POA: Diagnosis not present

## 2018-02-06 DIAGNOSIS — Z992 Dependence on renal dialysis: Secondary | ICD-10-CM | POA: Diagnosis not present

## 2018-02-06 DIAGNOSIS — N186 End stage renal disease: Secondary | ICD-10-CM | POA: Diagnosis not present

## 2018-02-07 DIAGNOSIS — N186 End stage renal disease: Secondary | ICD-10-CM | POA: Diagnosis not present

## 2018-02-07 DIAGNOSIS — Z992 Dependence on renal dialysis: Secondary | ICD-10-CM | POA: Diagnosis not present

## 2018-02-09 DIAGNOSIS — Z992 Dependence on renal dialysis: Secondary | ICD-10-CM | POA: Diagnosis not present

## 2018-02-09 DIAGNOSIS — N186 End stage renal disease: Secondary | ICD-10-CM | POA: Diagnosis not present

## 2018-02-11 DIAGNOSIS — N186 End stage renal disease: Secondary | ICD-10-CM | POA: Diagnosis not present

## 2018-02-11 DIAGNOSIS — Z992 Dependence on renal dialysis: Secondary | ICD-10-CM | POA: Diagnosis not present

## 2018-02-12 ENCOUNTER — Encounter (INDEPENDENT_AMBULATORY_CARE_PROVIDER_SITE_OTHER): Payer: Self-pay | Admitting: Orthopedic Surgery

## 2018-02-12 ENCOUNTER — Ambulatory Visit (INDEPENDENT_AMBULATORY_CARE_PROVIDER_SITE_OTHER): Payer: Medicare Other | Admitting: Physician Assistant

## 2018-02-12 VITALS — Ht 67.0 in | Wt 105.0 lb

## 2018-02-12 DIAGNOSIS — E43 Unspecified severe protein-calorie malnutrition: Secondary | ICD-10-CM

## 2018-02-12 DIAGNOSIS — Z89512 Acquired absence of left leg below knee: Secondary | ICD-10-CM

## 2018-02-12 DIAGNOSIS — N186 End stage renal disease: Secondary | ICD-10-CM

## 2018-02-12 DIAGNOSIS — Z89511 Acquired absence of right leg below knee: Secondary | ICD-10-CM

## 2018-02-13 ENCOUNTER — Encounter (INDEPENDENT_AMBULATORY_CARE_PROVIDER_SITE_OTHER): Payer: Self-pay | Admitting: Physician Assistant

## 2018-02-13 DIAGNOSIS — N186 End stage renal disease: Secondary | ICD-10-CM | POA: Diagnosis not present

## 2018-02-13 DIAGNOSIS — Z992 Dependence on renal dialysis: Secondary | ICD-10-CM | POA: Diagnosis not present

## 2018-02-13 NOTE — Progress Notes (Signed)
Office Visit Note   Patient: Charles Vasquez           Date of Birth: 06-13-1935           MRN: [id number] Visit Date: 02/12/2018              Requested by: Toma Deiters, MD [address] [place], [place] [address] PCP: Toma Deiters, MD  Chief Complaint  Patient presents with  . Left Leg - Routine Post Op    01/02/18 left BKA       HPI: The patient is an 82 yo gentleman here with is family,  who is seen for post operative follow up following left transtibial amputation on 01/02/18.  He is at [facility] in skilled nursing.The family reports poor po intake of protein supplements and food. He has been using a Vive stump shrinker stocking to the left transtibial amputation. There is some dehiscence of the incisional area. He was started on Doxycycline and is completing a 14 day course.   Assessment & Plan: Visit Diagnoses:  1. Acquired absence of leg below knee, left (HCC)   2. History of right below knee amputation (HCC)   3. Severe protein-calorie malnutrition (HCC)   4. End stage renal disease (HCC)     Plan: Sent orders to nursing facility and discussed with the patient and family to continue to try and work on protein intake. Continue Vive silver stump shrinker stocking daily and follow up in 4 weeks.   Follow-Up Instructions: Return in about 4 weeks (around 03/12/2018).   Ortho Exam  Patient is alert, oriented, no adenopathy, well-dressed, normal affect, normal respiratory effort. The right transtibial amputation site is healed and he has nearly full extension and flexion is good, but in varus.  The left transtibial amputation site has some necrosis about the lateral and central incision line, but scant drainage. Dark dry eschar otherwise.  No odor. No signs of cellulitis.   Imaging: No results found.   Labs: Lab Results  Component Value Date   HGBA1C 6.6 (H) 10/08/2017   HGBA1C 7.1 (H) 12/24/2016   HGBA1C 8.8 (H) 08/10/2016   ESRSEDRATE 110 (H) 07/12/2017    CRP 21.4 (H) 07/12/2017   REPTSTATUS 07/17/2017 FINAL 07/12/2017   GRAMSTAIN  08/04/2016    RARE WBC PRESENT, PREDOMINANTLY PMN FEW GRAM NEGATIVE RODS FEW GRAM NEGATIVE DIPLOCOCCI RARE GRAM POSITIVE COCCI IN CLUSTERS    CULT  07/12/2017    NO GROWTH 5 DAYS Performed at [facility], [address], [place], [place] [address]    [name] PROTEUS SPECIES 07/12/2017   LABORGA STREPTOCOCCUS PARASANGUINIS 07/12/2017     Lab Results  Component Value Date   ALBUMIN 2.1 (L) 01/07/2018   ALBUMIN 2.0 (L) 01/05/2018   ALBUMIN 2.4 (L) 01/03/2018    Body mass index is 16.45 kg/m.  Orders:  No orders of the defined types were placed in this encounter.  No orders of the defined types were placed in this encounter.    Procedures: No procedures performed  Clinical Data: No additional findings.  ROS:  All other systems negative, except as noted in the HPI. Review of Systems  Objective: Vital Signs: Ht 5\' 7"  (1.702 m)   Wt 105 lb (47.6 kg)   BMI 16.45 kg/m   Specialty Comments:  No specialty comments available.  PMFS History: Patient Active Problem List   Diagnosis Date Noted  . Left below-knee amputee (HCC) 01/02/2018  . Gangrene of left  foot (HCC)   . Osteomyelitis of finger of right hand (HCC) 09/24/2017  . Severe protein-calorie malnutrition (HCC) 09/16/2017  . 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 Labile blood pressure   . Chronic left shoulder pain   . Bacteremia   . Labile blood glucose   . Hypertensive crisis   . Hypoglycemia   . Leukocytosis   . Acute blood loss anemia   . Anemia of chronic disease   . ESRD on dialysis (HCC)   . Type 2 diabetes mellitus with peripheral neuropathy (HCC)   . Benign essential HTN   . 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 Pressure injury of skin 07/13/2017  . Diabetic foot infection (HCC) 07/12/2017  . CAD  (coronary artery disease) 07/12/2017  . Type II diabetes mellitus with renal manifestations (HCC) 07/12/2017  . Wound of right foot   . Loose stools 05/20/2017  . Abdominal pain   . Loss of weight 03/18/2017  . Abnormal CT scan, colon 03/18/2017  . Melena 03/18/2017  . Periumbilical abdominal pain 03/18/2017  . ESRD (end stage renal disease) on dialysis (HCC) 10/22/2016  . Venous hypertension status post AVG procedure 08/10/2016  . Anasarca 08/04/2016  . Facial swelling 08/04/2016  . Effusion, other site 08/04/2016  . 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 Acute respiratory failure (HCC) 08/04/2016  . Hyperkalemia 08/04/2016  . Aftercare following surgery of the circulatory system, NEC-Right  AVGG 09/25/2012  . Other complications due to renal dialysis device, implant, and graft 05/01/2012  . End stage renal disease (HCC) 03/17/2012  . FATIGUE, ACUTE 08/22/2008  . HEMATURIA UNSPECIFIED 07/11/2008  . Diabetes (HCC) 01/19/2008  . Essential hypertension 01/19/2008  . MYOCARDIAL INFARCTION, HX OF 01/19/2008  . GERD 01/19/2008  . OSTEOARTHRITIS 01/19/2008  . CEREBROVASCULAR ACCIDENT, HX OF 01/19/2008   Past Medical History:  Diagnosis Date  . Anal pain   . AVF (arteriovenous fistula) (HCC) 05-13-2017 per pt currently AVF access used is left thigh   hx multiple AVF surgery's (previously left upper arm, bilateral thigh) last surgery -- right upper arm creation 11-26-2016  . ESRD (end stage renal disease) on dialysis [facility])    [facility] in [place], [place] on MWF   . Gangrene (HCC)    left leg  . History of CVA (cerebrovascular accident)    05-13-2017  per pt stated "thats what they told yrs ago"  per pt no residual  . Hyperlipidemia   . Hypertension   . Myocardial infarction (HCC)   . Stroke (HCC)   . Type 2 diabetes mellitus treated with insulin (HCC)    Type II    Family History  Problem Relation Age of Onset  . Diabetes Mother   . Hypertension Mother   . AAA (abdominal aortic  aneurysm) Mother   . Diabetes Father   . Hypertension Father     Past Surgical History:  Procedure Laterality Date  . ABDOMINAL AORTOGRAM W/LOWER EXTREMITY N/A 06/25/2017   Procedure: ABDOMINAL AORTOGRAM W/LOWER EXTREMITY;  Surgeon: Maeola Harman, MD;  Location: [facility] INVASIVE CV LAB;  Service: Cardiovascular;  Laterality: N/A;  . AMPUTATION Right 01/01/2017   Procedure: REVISION AMPUTATION RIGHT INDEX FINGER;  Surgeon: Dairl Ponder, MD;  Location: [facility] OR;  Service: Orthopedics;  Laterality: Right;  . AMPUTATION Right 07/15/2017   Procedure: AMPUTATION BELOW KNEE RIGHT;  Surgeon: [name], MD;  Location: [facility] OR;  Service: Vascular;  Laterality: Right;  .  AMPUTATION Right 10/08/2017   Procedure: RIGHT INDEX, RIGHT LONG REVISION AMPUTATIONS;  Surgeon: Dairl Ponder, MD;  Location: [facility] OR;  Service: Orthopedics;  Laterality: Right;  . AMPUTATION Left 01/02/2018   Procedure: LEFT BELOW KNEE AMPUTATION;  Surgeon: Nadara Mustard, MD;  Location: [facility] OR;  Service: Orthopedics;  Laterality: Left;  . ARTERIOVENOUS GRAFT PLACEMENT    . ARTERIOVENOUS GRAFT PLACEMENT Left 10/22/2016   thigh  . AV FISTULA PLACEMENT  03/31/2012   Procedure: ARTERIOVENOUS (AV) FISTULA CREATION;  Surgeon: [name], MD;  Location: [facility] OR;  Service: Vascular;  Laterality: Right;  First stage Brachial vein transposition  . AV FISTULA PLACEMENT Right 08/25/2012   Procedure: INSERTION OF ARTERIOVENOUS (AV) GORE-TEX GRAFT ARM;  Surgeon: [name], MD;  Location: [facility] OR;  Service: Vascular;  Laterality: Right;  . AV FISTULA PLACEMENT Left 07/23/2016   Procedure: INSERTION OF ARTERIOVENOUS (AV) GORE-TEX GRAFT LEFT UPPER ARM;  Surgeon: [name], MD;  Location: [facility] OR;  Service: Vascular;  Laterality: Left;  . AV FISTULA PLACEMENT Left 10/22/2016   Procedure: INSERTION OF 4-89mm x 45cm  ARTERIOVENOUS (AV) GORE-TEX GRAFT THIGH-LEFT;  Surgeon: [name], MD;  Location: [facility] OR;  Service: Vascular;  Laterality:  Left;  . CATARACT EXTRACTION W/ INTRAOCULAR LENS  IMPLANT, BILATERAL    . COLONOSCOPY N/A 03/25/2017   Dr. Darrick Penna: examined portion of ileum normal. Redundant left colon, stricture at anus   . ESOPHAGOGASTRODUODENOSCOPY N/A 03/25/2017   Dr. Darrick Penna: widely patent Schatzki ring at GE junction s/p dilation, atrophic gastritis, single duodenal AVM, non-bleeding  . INSERTION OF DIALYSIS CATHETER Left 05/05/2012   Procedure: INSERTION OF DIALYSIS CATHETER;  Surgeon: Chuck Hint, MD;  Location: [facility] OR;  Service: Vascular;  Laterality: Left;  . INSERTION OF DIALYSIS CATHETER Right 10/01/2016   Procedure: INSERTION OF DIALYSIS CATHETER- RIGHT FEMORAL;  Surgeon: [name], MD;  Location: [facility] OR;  Service: Vascular;  Laterality: Right;  . IR FLUORO GUIDE CV LINE RIGHT  06/10/2016  . IR GENERIC HISTORICAL  06/07/2016   IR US GUIDE VASC ACCESS RIGHT 06/07/2016 Oley Balm, MD [facility]-INTERV RAD  . IR THROMBECTOMY AV FISTULA W/THROMBOLYSIS/PTA INC/SHUNT/IMG RIGHT Right 06/07/2016  . IR US GUIDE VASC ACCESS RIGHT  06/10/2016  . LIGATION ARTERIOVENOUS GORTEX GRAFT Left 08/04/2016   Procedure: LIGATION ARTERIOVENOUS GORTEX GRAFT;  Surgeon: Larina Earthly, MD;  Location: [facility] OR;  Service: Vascular;  Laterality: Left;  . LIGATION ARTERIOVENOUS GORTEX GRAFT Right 11/26/2016   Procedure: LIGATION OF RIGHT UPPER ARM ARTERIOVENOUS GORTEX GRAFT;  Surgeon: [name], MD;  Location: [facility] OR;  Service: Vascular;  Laterality: Right;  . LIGATION OF ARTERIOVENOUS  FISTULA Right 08/25/2012   Procedure: LIGATION OF ARTERIOVENOUS  FISTULA;  Surgeon: [name], MD;  Location: [facility] OR;  Service: Vascular;  Laterality: Right;  Ligation of right brachial vein transposition  . PERIPHERAL VASCULAR ATHERECTOMY  06/25/2017   Procedure: PERIPHERAL VASCULAR ATHERECTOMY;  Surgeon: Maeola Harman, MD;  Location: [facility] INVASIVE CV LAB;  Service: Cardiovascular;;  Rt. PT  . REMOVAL OF A DIALYSIS CATHETER Right 05/05/2012   Procedure:  REMOVAL OF A DIALYSIS CATHETER;  Surgeon: Chuck Hint, MD;  Location: [facility] OR;  Service: Vascular;  Laterality: Right;  . REMOVAL OF A DIALYSIS CATHETER Right 10/01/2016   Procedure: REMOVAL OF A DIALYSIS CATHETER-RIGHT UPPER CHEST;  Surgeon: [name], MD;  Location: [facility] OR;  Service: Vascular;  Laterality: Right;  . REMOVAL OF A DIALYSIS CATHETER  Right 11/26/2016   Procedure: REMOVAL OF RIGHT FEMORAL TUNNEL DIALYSIS CATHETER;  Surgeon: [name], MD;  Location: [facility] OR;  Service: Vascular;  Laterality: Right;  . SPHINCTEROTOMY N/A 05/15/2017   Procedure: POSSIBLE SPHINCTEROTOMY (BOTOX);  Surgeon: Romie Levee, MD;  Location: [facility];  Service: General;  Laterality: N/A;  . TOE AMPUTATION  02/2007   left foot first 3 toes  . UPPER EXTREMITY VENOGRAPHY Bilateral 07/18/2016   Procedure: Bilateral Upper Extremity Venography;  Surgeon: [name], MD;  Location: [facility] INVASIVE CV LAB;  Service: Cardiovascular;  Laterality: Bilateral;   Social History   Occupational History  . Not on file  Tobacco Use  . Smoking status: Never Smoker  . Smokeless tobacco: Never Used  Substance and Sexual Activity  . Alcohol use: No  . Drug use: No  . Sexual activity: Yes

## 2018-02-16 DIAGNOSIS — N186 End stage renal disease: Secondary | ICD-10-CM | POA: Diagnosis not present

## 2018-02-16 DIAGNOSIS — Z992 Dependence on renal dialysis: Secondary | ICD-10-CM | POA: Diagnosis not present

## 2018-02-17 ENCOUNTER — Other Ambulatory Visit: Payer: Self-pay | Admitting: Orthopedic Surgery

## 2018-02-17 ENCOUNTER — Encounter (HOSPITAL_BASED_OUTPATIENT_CLINIC_OR_DEPARTMENT_OTHER): Payer: Medicare Other | Attending: Internal Medicine

## 2018-02-17 DIAGNOSIS — I252 Old myocardial infarction: Secondary | ICD-10-CM | POA: Insufficient documentation

## 2018-02-17 DIAGNOSIS — E1022 Type 1 diabetes mellitus with diabetic chronic kidney disease: Secondary | ICD-10-CM | POA: Insufficient documentation

## 2018-02-17 DIAGNOSIS — T8781 Dehiscence of amputation stump: Secondary | ICD-10-CM | POA: Diagnosis not present

## 2018-02-17 DIAGNOSIS — Z89021 Acquired absence of right finger(s): Secondary | ICD-10-CM | POA: Insufficient documentation

## 2018-02-17 DIAGNOSIS — Z89512 Acquired absence of left leg below knee: Secondary | ICD-10-CM | POA: Insufficient documentation

## 2018-02-17 DIAGNOSIS — Z794 Long term (current) use of insulin: Secondary | ICD-10-CM | POA: Diagnosis not present

## 2018-02-17 DIAGNOSIS — Y835 Amputation of limb(s) as the cause of abnormal reaction of the patient, or of later complication, without mention of misadventure at the time of the procedure: Secondary | ICD-10-CM | POA: Insufficient documentation

## 2018-02-17 DIAGNOSIS — D649 Anemia, unspecified: Secondary | ICD-10-CM | POA: Diagnosis not present

## 2018-02-17 DIAGNOSIS — X58XXXA Exposure to other specified factors, initial encounter: Secondary | ICD-10-CM | POA: Insufficient documentation

## 2018-02-17 DIAGNOSIS — S61204A Unspecified open wound of right ring finger without damage to nail, initial encounter: Secondary | ICD-10-CM | POA: Insufficient documentation

## 2018-02-17 DIAGNOSIS — E1036 Type 1 diabetes mellitus with diabetic cataract: Secondary | ICD-10-CM | POA: Diagnosis not present

## 2018-02-17 DIAGNOSIS — I10 Essential (primary) hypertension: Secondary | ICD-10-CM | POA: Diagnosis not present

## 2018-02-17 DIAGNOSIS — I96 Gangrene, not elsewhere classified: Secondary | ICD-10-CM | POA: Diagnosis not present

## 2018-02-17 DIAGNOSIS — I2510 Atherosclerotic heart disease of native coronary artery without angina pectoris: Secondary | ICD-10-CM | POA: Insufficient documentation

## 2018-02-17 DIAGNOSIS — S61202A Unspecified open wound of right middle finger without damage to nail, initial encounter: Secondary | ICD-10-CM | POA: Diagnosis not present

## 2018-02-17 DIAGNOSIS — N186 End stage renal disease: Secondary | ICD-10-CM | POA: Insufficient documentation

## 2018-02-17 DIAGNOSIS — T8789 Other complications of amputation stump: Secondary | ICD-10-CM | POA: Diagnosis not present

## 2018-02-17 DIAGNOSIS — Z79899 Other long term (current) drug therapy: Secondary | ICD-10-CM | POA: Diagnosis not present

## 2018-02-17 DIAGNOSIS — Z992 Dependence on renal dialysis: Secondary | ICD-10-CM | POA: Insufficient documentation

## 2018-02-17 DIAGNOSIS — Z89511 Acquired absence of right leg below knee: Secondary | ICD-10-CM | POA: Diagnosis not present

## 2018-02-17 DIAGNOSIS — E119 Type 2 diabetes mellitus without complications: Secondary | ICD-10-CM | POA: Diagnosis not present

## 2018-02-17 DIAGNOSIS — Z888 Allergy status to other drugs, medicaments and biological substances status: Secondary | ICD-10-CM | POA: Diagnosis not present

## 2018-02-18 DIAGNOSIS — N186 End stage renal disease: Secondary | ICD-10-CM | POA: Diagnosis not present

## 2018-02-18 DIAGNOSIS — Z992 Dependence on renal dialysis: Secondary | ICD-10-CM | POA: Diagnosis not present

## 2018-02-19 ENCOUNTER — Encounter: Payer: Self-pay | Admitting: Nurse Practitioner

## 2018-02-19 ENCOUNTER — Telehealth: Payer: Self-pay | Admitting: Nurse Practitioner

## 2018-02-19 ENCOUNTER — Encounter: Payer: Self-pay | Admitting: Gastroenterology

## 2018-02-19 ENCOUNTER — Ambulatory Visit (INDEPENDENT_AMBULATORY_CARE_PROVIDER_SITE_OTHER): Payer: Medicare Other | Admitting: Nurse Practitioner

## 2018-02-19 VITALS — BP 156/70 | HR 109 | Temp 89.0°F | Ht 67.0 in | Wt 109.0 lb

## 2018-02-19 DIAGNOSIS — D62 Acute posthemorrhagic anemia: Secondary | ICD-10-CM | POA: Diagnosis not present

## 2018-02-19 DIAGNOSIS — K5520 Angiodysplasia of colon without hemorrhage: Secondary | ICD-10-CM | POA: Diagnosis not present

## 2018-02-19 DIAGNOSIS — K921 Melena: Secondary | ICD-10-CM

## 2018-02-19 NOTE — Progress Notes (Signed)
Referring Provider: Toma Deiters, MD Primary Care Physician:  Toma Deiters, MD Primary GI:  Dr. Darrick Penna  Chief Complaint  Patient presents with  . heme + stool    dark stools x several week    HPI:   Charles Vasquez is a 82 y.o. male who presents on referral from the nursing home for dark stools and heme positive stools.  Review medications and the patient is on iron twice daily.  No provider visit notes sent with the patient documenting any heme positive stool.  He was last seen by our office 05/20/2017 for loose stools and weight loss.  Noted history of GERD and diarrhea.  EGD and colonoscopy completed January 2019 which was unrevealing to explain diarrhea.  Noted anal stricture and referred to general surgery.  Recommended Bentyl, PPI, general surgery referral.  Saw Dr. 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  Sometimes watery stool, sometimes soft.  Intermittent abdominal cramping with 3 bowel movements a day for about the past 2 to 3 months.  Appetite good.  Weight loss of about 12 pounds in the previous year.  Recommended restart Protonix, continue Bentyl twice a day, pancreatic enzymes, follow-up in 6 to 8 weeks.  However, the patient canceled his office visit and he was not rescheduled until December.  The patient called our office today after his office visit indicating worse stools.  Stool studies were ordered.  He was positive for C. difficile, query active infection versus carrier.  He was empirically treated with vancomycin.  Patient's family again called our office early April to indicate still some persistent diarrhea with very soft stools and sometimes watery.  Again recommended the patient take Creon pancreatic enzymes, which he was not taken per previous recommendations.  Also recommended start probiotic.  A referral was made to [facility] due to confusion of her medication.  Patient's  family called our office again 06/19/2017 stating fecal urgency and accidents with dark/black stools and a bad odor.  Recommended repeat C. difficile studies.  Also recommended low-dose Bentyl.  Patient called our office 01/22/2018 stated the patient was having dark stools for several weeks, noted recent amputation.  Referred by Dr. Kristian Covey for heme positive stool.  Recommend going to the emergency department.  The family declined and preferred to hold out for an appointment in December.  It was again recommended that the patient go to the emergency room.  No ER visits in the [facility] system.  Unsure if the patient went to the emergency department at [facility]  Of note endoscopy completed this year found moderate gastritis which was biopsied and a single duodenal AVM which is nonbleeding at that time.  Recommended iron twice daily and PPI due to gastritis.  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  Most recent CBC completed 01/08/2018 which found hemoglobin of 10.1.  It appears patient received a blood transfusion on 01/09/2018.  This was associated with an inpatient admission for gangrene of the left foot and left below the knee amputation.  Today he is accompanied by family. Dark stools first noted about a month ago. Dark stools are with about every bowel movement (especially for the last week or two). Nephrology checked and found heme+ stools. Still on iron bid. He had to have 2 units of blood yesterday for low hgb (unsure how low it was). No obvious worsening weakness leading up to that. He has been having  lower abdominal pain about 4 days ago which has resolved. Denies any significant hematochezia. Denies N/V, fever, chills. Denies chest pain, dyspnea, dizziness, lightheadedness, syncope, near syncope. Denies any other upper or lower GI symptoms.  He is scheduled to have further finger amputation this week.  Past Medical History:  Diagnosis Date  . Anal  pain   . AVF (arteriovenous fistula) (HCC) 05-13-2017 per pt currently AVF access used is left thigh   hx multiple AVF surgery's (previously left upper arm, bilateral thigh) last surgery -- right upper arm creation 11-26-2016  . ESRD (end stage renal disease) on dialysis [facility])    [facility] in [place], [place] on MWF   . Gangrene (HCC)    left leg  . History of CVA (cerebrovascular accident)    05-13-2017  per pt stated "thats what they told yrs ago"  per pt no residual  . Hyperlipidemia   . Hypertension   . Myocardial infarction (HCC)   . Stroke (HCC)   . Type 2 diabetes mellitus treated with insulin (HCC)    Type II    Past Surgical History:  Procedure Laterality Date  . ABDOMINAL AORTOGRAM W/LOWER EXTREMITY N/A 06/25/2017   Procedure: ABDOMINAL AORTOGRAM W/LOWER EXTREMITY;  Surgeon: Maeola Harman, MD;  Location: [facility] INVASIVE CV LAB;  Service: Cardiovascular;  Laterality: N/A;  . AMPUTATION Right 01/01/2017   Procedure: REVISION AMPUTATION RIGHT INDEX FINGER;  Surgeon: Dairl Ponder, MD;  Location: [facility] OR;  Service: Orthopedics;  Laterality: Right;  . AMPUTATION Right 07/15/2017   Procedure: AMPUTATION BELOW KNEE RIGHT;  Surgeon: [name], MD;  Location: [facility] OR;  Service: Vascular;  Laterality: Right;  . AMPUTATION Right 10/08/2017   Procedure: RIGHT INDEX, RIGHT LONG REVISION AMPUTATIONS;  Surgeon: Dairl Ponder, MD;  Location: [facility] OR;  Service: Orthopedics;  Laterality: Right;  . AMPUTATION Left 01/02/2018   Procedure: LEFT BELOW KNEE AMPUTATION;  Surgeon: Nadara Mustard, MD;  Location: [facility] OR;  Service: Orthopedics;  Laterality: Left;  . ARTERIOVENOUS GRAFT PLACEMENT    . ARTERIOVENOUS GRAFT PLACEMENT Left 10/22/2016   thigh  . AV FISTULA PLACEMENT  03/31/2012   Procedure: ARTERIOVENOUS (AV) FISTULA CREATION;  Surgeon: [name], MD;  [facility] OR;  Service: Vascular;  Laterality: Right;  First stage Brachial vein transposition  . AV FISTULA PLACEMENT  Right 08/25/2012   Procedure: INSERTION OF ARTERIOVENOUS (AV) GORE-TEX GRAFT ARM;  Surgeon: [name], MD;  Location: [facility] OR;  Service: Vascular;  Laterality: Right;  . AV FISTULA PLACEMENT Left 07/23/2016   Procedure: INSERTION OF ARTERIOVENOUS (AV) GORE-TEX GRAFT LEFT UPPER ARM;  Surgeon: [name], MD;  Location: [facility] OR;  Service: Vascular;  Laterality: Left;  . AV FISTULA PLACEMENT Left 10/22/2016   Procedure: INSERTION OF 4-50mm x 45cm  ARTERIOVENOUS (AV) GORE-TEX GRAFT THIGH-LEFT;  Surgeon: [name], MD;  Location: [facility] OR;  Service: Vascular;  Laterality: Left;  . CATARACT EXTRACTION W/ INTRAOCULAR LENS  IMPLANT, BILATERAL    . COLONOSCOPY N/A 03/25/2017   Dr. Darrick Penna: examined portion of ileum normal. Redundant left colon, stricture at anus   . ESOPHAGOGASTRODUODENOSCOPY N/A 03/25/2017   Dr. Darrick Penna: widely patent Schatzki ring at GE junction s/p dilation, atrophic gastritis, single duodenal AVM, non-bleeding  . INSERTION OF DIALYSIS CATHETER Left 05/05/2012   Procedure: INSERTION OF DIALYSIS CATHETER;  Surgeon: Chuck Hint, MD;  Location: [facility] OR;  Service: Vascular;  Laterality: Left;  . INSERTION OF DIALYSIS CATHETER Right 10/01/2016   Procedure:  INSERTION OF DIALYSIS CATHETER- RIGHT FEMORAL;  Surgeon: [name], MD;  Location: [facility] OR;  Service: Vascular;  Laterality: Right;  . IR FLUORO GUIDE CV LINE RIGHT  06/10/2016  . IR GENERIC HISTORICAL  06/07/2016   IR US GUIDE VASC ACCESS RIGHT 06/07/2016 Oley Balm, MD [facility]-INTERV RAD  . IR THROMBECTOMY AV FISTULA W/THROMBOLYSIS/PTA INC/SHUNT/IMG RIGHT Right 06/07/2016  . IR US GUIDE VASC ACCESS RIGHT  06/10/2016  . LIGATION ARTERIOVENOUS GORTEX GRAFT Left 08/04/2016   Procedure: LIGATION ARTERIOVENOUS GORTEX GRAFT;  Surgeon: Larina Earthly, MD;  Location: [facility] OR;  Service: Vascular;  Laterality: Left;  . LIGATION ARTERIOVENOUS GORTEX GRAFT Right 11/26/2016   Procedure: LIGATION OF RIGHT UPPER ARM ARTERIOVENOUS GORTEX GRAFT;  Surgeon:  [name], MD;  Location: [facility] OR;  Service: Vascular;  Laterality: Right;  . LIGATION OF ARTERIOVENOUS  FISTULA Right 08/25/2012   Procedure: LIGATION OF ARTERIOVENOUS  FISTULA;  Surgeon: [name], MD;  Location: [facility] OR;  Service: Vascular;  Laterality: Right;  Ligation of right brachial vein transposition  . PERIPHERAL VASCULAR ATHERECTOMY  06/25/2017   Procedure: PERIPHERAL VASCULAR ATHERECTOMY;  Surgeon: Maeola Harman, MD;  Location: [facility] INVASIVE CV LAB;  Service: Cardiovascular;;  Rt. PT  . REMOVAL OF A DIALYSIS CATHETER Right 05/05/2012   Procedure: REMOVAL OF A DIALYSIS CATHETER;  Surgeon: Chuck Hint, MD;  Location: [facility] OR;  Service: Vascular;  Laterality: Right;  . REMOVAL OF A DIALYSIS CATHETER Right 10/01/2016   Procedure: REMOVAL OF A DIALYSIS CATHETER-RIGHT UPPER CHEST;  Surgeon: [name], MD;  Location: [facility] OR;  Service: Vascular;  Laterality: Right;  . REMOVAL OF A DIALYSIS CATHETER Right 11/26/2016   Procedure: REMOVAL OF RIGHT FEMORAL TUNNEL DIALYSIS CATHETER;  Surgeon: [name], MD;  Location: [facility] OR;  Service: Vascular;  Laterality: Right;  . SPHINCTEROTOMY N/A 05/15/2017   Procedure: POSSIBLE SPHINCTEROTOMY (BOTOX);  Surgeon: Romie Levee, MD;  Location: [facility];  Service: General;  Laterality: N/A;  . TOE AMPUTATION  02/2007   left foot first 3 toes  . UPPER EXTREMITY VENOGRAPHY Bilateral 07/18/2016   Procedure: Bilateral Upper Extremity Venography;  Surgeon: [name], MD;  Location: [facility] INVASIVE CV LAB;  Service: Cardiovascular;  Laterality: Bilateral;    Current Outpatient Medications  Medication Sig Dispense Refill  . acetaminophen (TYLENOL) 325 MG tablet Take 325 mg by mouth 4 (four) times daily as needed for mild pain or fever.    Marland Kitchen amitriptyline (ELAVIL) 25 MG tablet Take 25 mg by mouth daily at 8 pm.     . aspirin EC 81 MG tablet Take 81 mg by mouth daily.    Marland Kitchen dextrose (GLUTOSE) 40 % GEL Take 1 Tube by mouth once  as needed for low blood sugar.    . dicyclomine (BENTYL) 10 MG capsule 1 PO 30 MINUTES PRIOR TO BREAKFAST AND LUNCH (Patient taking differently: Take 10 mg by mouth as needed (diarrhea). ) 62 capsule 11  . HYDROcodone-acetaminophen (NORCO/VICODIN) 5-325 MG tablet Take 1 tablet by mouth every 4 (four) hours as needed for moderate pain. (Patient taking differently: Take 1 tablet by mouth every 6 (six) hours as needed for moderate pain. ) 40 tablet 0  . insulin aspart (NOVOLOG) 100 UNIT/ML injection Inject into the skin 3 (three) times daily before meals. Sliding scale    . ipratropium (ATROVENT) 0.02 % nebulizer solution Take 0.4 mg by nebulization every 6 (six) hours as needed (congestion).    . iron  polysaccharides (NIFEREX) 150 MG capsule Take 1 capsule (150 mg total) by mouth 2 (two) times daily.    . isosorbide mononitrate (IMDUR) 30 MG 24 hr tablet Take 30 mg by mouth daily.    Marland Kitchen losartan (COZAAR) 50 MG tablet Take 50 mg by mouth daily.     . multivitamin (RENA-VIT) TABS tablet Take 1 tablet by mouth daily.  0  . pantoprazole (PROTONIX) 40 MG tablet Take 1 tablet (40 mg total) by mouth 2 (two) times daily with a meal. (Patient taking differently: Take 40 mg by mouth daily as needed (gerd). )    . polyethylene glycol (MIRALAX / GLYCOLAX) packet Take 17 g by mouth as needed for moderate constipation.    . senna (SENOKOT) 8.6 MG tablet Take 2 tablets by mouth daily as needed for constipation.    . sevelamer carbonate (RENVELA) 800 MG tablet Take 2 tablets (1,600 mg total) by mouth 3 (three) times daily with meals. (Patient taking differently: Take 1,600 mg by mouth 3 (three) times daily with meals. (0800, 1200, 1700))    . insulin lispro (HUMALOG) 100 UNIT/ML injection Inject 0-8 Units into the skin 3 (three) times daily before meals. Blood sugar of 70-100=0 units 101-250=1 unit 151-200=2 units 201-250=4 units  251-300=6 units 301-350= 8 units Greater than 350= 10 units **Call MD if glucose  greater than 450**     No current facility-administered medications for this visit.    Facility-Administered Medications Ordered in Other Visits  Medication Dose Route Frequency Provider Last Rate Last Dose  . 0.9 %  sodium chloride infusion (Manually program via Guardrails IV Fluids)   Intravenous Once Rayburn, Fanny Bien, PA-C        Allergies as of 02/19/2018 - Review Complete 02/19/2018  Allergen Reaction Noted  . Lisinopril Other (See Comments)     Family History  Problem Relation Age of Onset  . Diabetes Mother   . Hypertension Mother   . AAA (abdominal aortic aneurysm) Mother   . Diabetes Father   . Hypertension Father     Social History   Socioeconomic History  . Marital status: Widowed    Spouse name: Not on file  . Number of children: Not on file  . Years of education: Not on file  . Highest education level: Not on file  Occupational History  . Not on file  Social Needs  . Financial resource strain: Not very hard  . Food insecurity:    Worry: Never true    Inability: Never true  . Transportation needs:    Medical: No    Non-medical: No  Tobacco Use  . Smoking status: Never Smoker  . Smokeless tobacco: Never Used  Substance and Sexual Activity  . Alcohol use: No  . Drug use: No  . Sexual activity: Yes  Lifestyle  . Physical activity:    Days per week: 2 days    Minutes per session: 20 min  . Stress: Only a little  Relationships  . Social connections:    Talks on phone: Once a week    Gets together: Once a week    Attends religious service: 1 to 4 times per year    Active member of club or organization: Yes    Attends meetings of clubs or organizations: 1 to 4 times per year    Relationship status: Widowed  Other Topics Concern  . Not on file  Social History Narrative  . Not on file    Review of Systems: Complete  ROS negative except as per HPI.   Physical Exam: BP (!) 156/70   Pulse (!) 109   Temp (!) 89 F (31.7 C) (Oral)   Ht  5\' 7"  (1.702 m)   Wt 109 lb (49.4 kg)   BMI 17.07 kg/m  General:   Alert and oriented. Pleasant and cooperative. Well-nourished and well-developed. In a wheelchair. Eyes:  Without icterus, sclera clear and conjunctiva pink.  Ears:  Normal auditory acuity. Cardiovascular:  S1, S2 present without murmurs appreciated. Extremities without clubbing or edema. Respiratory:  Clear to auscultation bilaterally. No wheezes, rales, or rhonchi. No distress.  Gastrointestinal:  +BS, soft, non-tender and non-distended. No HSM noted. No guarding or rebound. No masses appreciated.  Rectal:  Deferred  Musculoskalatal:  Noted bilateral BKA. Neurologic:  Alert and oriented x4;  grossly normal neurologically. Psych:  Alert and cooperative. Normal mood and affect. Heme/Lymph/Immune: No excessive bruising noted.    02/19/2018 11:23 AM   Disclaimer: This note was dictated with voice recognition software. Similar sounding words can inadvertently be transcribed and may not be corrected upon review.

## 2018-02-19 NOTE — Assessment & Plan Note (Signed)
The patient has multifactorial anemia as per below.  Colonoscopy and EGD completed January of this year.  Specifically the EGD found atrophic gastritis and a single AVM which was nonbleeding and therefore not treated during his procedure.  He has had persistent anemia and was referred to [place] for heme positive stool and dark stools, query possible melena.  At this point given that he has recently had an EGD we will schedule a patency capsule to be followed by a Givens capsule for further evaluation of small bowel for etiologies.  He is a poor candidate for intervention and will likely need a hematology referral to follow his anemia.  Message sent to the patient for scheduling.  We will call with results of labs and capsule studies.  Follow-up in 2 months.

## 2018-02-19 NOTE — Assessment & Plan Note (Signed)
The patient was referred for heme positive stool.  A complaint of dark stools, specifically in the past week.  He required a transfusion yesterday.  He has multiple comorbidities.  He recently underwent a additional below the knee amputation for poor circulation and gangrene.  His stump is not healing well.  He has now had 2 below the knee amputations.  He is additionally had a finger amputation is scheduled this week for additional amputation of the finger.  He was transfused yesterday and given surgery later this week he may need further transfusion.  We will try to evaluate his possible etiologies as per AVM assessment and plan above.  Follow-up in 2 months.

## 2018-02-19 NOTE — Pre-Procedure Instructions (Signed)
    Charles Vasquez  02/19/2018     Your procedure is scheduled on December 14.  Report to [facility] ER at 05:50 A.M.  Call this number if you have problems the morning of surgery:  [phone]   Remember:  Do not eat or drink after midnight.   Give these medicines the morning of surgery with A SIP OF WATER: Hydrocodone, Atrovent, Isosorbide, Protonix    Please send a MAR printed the morning of surgery that we can see the last dose of medication that have been given.   Please send any cardiac test such as EKG, Echo, Stress test   Please send most recent H&P      How do I manage my blood sugar before surgery? o Check patient's blood sugar the morning of your surgery when you wake up and every 2 hours until you get to the Short Stay unit. . If your blood sugar is less than 70 mg/dL, you will need to treat for low blood sugar: o Do not take insulin. o Treat a low blood sugar (less than 70 mg/dL) with  cup of clear juice (cranberry or apple), 4 glucose tablets, OR glucose gel. Recheck blood sugar in 15 minutes after treatment (to make sure it is greater than 70 mg/dL). If your blood sugar is not greater than 70 mg/dL on recheck follow facility protocol.   . If your CBG is greater than 220 mg/dL, you may give  of your sliding scale (correction) dose of insulin.    Do not wear jewelry, make-up or nail polish.  Do not wear lotions, powders, or perfumes, or deodorant.  Do not shave 48 hours prior to surgery.  Men may shave face and neck.  Do not bring valuables to the hospital.  [facility] is not responsible for any belongings or valuables.  Contacts, dentures or bridgework may not be worn into surgery.  Leave your suitcase in the car.  After surgery it may be brought to your room.  For patients admitted to the hospital, discharge time will be determined by your treatment team.

## 2018-02-19 NOTE — Assessment & Plan Note (Signed)
Apparently acute blood loss anemia on chronic multifactorial anemia.  He has anemia of chronic disease, possible iron deficiency anemia with GI losses.  EGD and colonoscopy reviewed as per HPI.  We are planning for a patency capsule followed by a Givens capsule.  I have ordered ferritin and CBC to follow-up on he has surgery scheduled later this week and they will likely follow-up on his blood levels at that time.  Given his multiple comorbidities he is not a very good surgical candidate or procedural candidate.  We will most likely need to refer him to hematology for ongoing management of anemia multifactorial in nature.  Follow-up here in 2 months.  Further recommendations to follow capsule studies and labs.

## 2018-02-19 NOTE — Progress Notes (Signed)
Preop instructions faxed to [facility], LPN confirmed that she had received instructions.

## 2018-02-19 NOTE — Patient Instructions (Addendum)
1. Have your labs completed when you are able to. 2. I will further discuss his situation with Dr. Darrick Penna to decide on best next steps. 3. We will notify you of recommendations shortly. 4. Good luck with surgery this weekend! 5. Return for follow-up in 2 months. 6. Call us if you have any questions or concerns.  At [facility] we value your feedback. You may receive a survey about your visit today. Please share your experience as we strive to create trusting relationships with our patients to provide genuine, compassionate, quality care.  We appreciate your understanding and patience as we review any laboratory studies, imaging, and other diagnostic tests that are ordered as we care for you. Our office policy is 5 business days for review of these results, and any emergent or urgent results are addressed in a timely manner for your best interest. If you do not hear from our office in 1 week, please contact us.   We also encourage the use of MyChart, which contains your medical information for your review as well. If you are not enrolled in this feature, an access code is on this after visit summary for your convenience. Thank you for allowing [place] to be involved in your care.  It was great to see you today!  I hope you have a Merry Christmas!!

## 2018-02-19 NOTE — Telephone Encounter (Signed)
Please tell the patient I discussed his care with Dr. Darrick Penna. We've decided to proceed with a Givens Capsule FIRST PROCEEDED by a patency capsule.  He will likely need to be referred to hematology for ongoing anemia. We can address this when we get labs back and results from Givens capsule.  Please schedule.  Let me know if they have any questions related to this.

## 2018-02-20 ENCOUNTER — Other Ambulatory Visit: Payer: Self-pay

## 2018-02-20 DIAGNOSIS — N186 End stage renal disease: Secondary | ICD-10-CM | POA: Diagnosis not present

## 2018-02-20 DIAGNOSIS — T189XXA Foreign body of alimentary tract, part unspecified, initial encounter: Secondary | ICD-10-CM

## 2018-02-20 DIAGNOSIS — Z992 Dependence on renal dialysis: Secondary | ICD-10-CM | POA: Diagnosis not present

## 2018-02-20 DIAGNOSIS — D649 Anemia, unspecified: Secondary | ICD-10-CM

## 2018-02-20 NOTE — Progress Notes (Signed)
CC'D TO PCP °

## 2018-02-20 NOTE — Anesthesia Preprocedure Evaluation (Addendum)
Anesthesia Evaluation  Patient identified by MRN, date of birth, ID band Patient awake    Reviewed: Allergy & Precautions, NPO status , Patient's Chart, lab work & pertinent test results  Airway Mallampati: III  TM Distance: >3 FB Neck ROM: Full    Dental  (+) Edentulous Upper, Edentulous Lower   Pulmonary neg pulmonary ROS,    Pulmonary exam normal breath sounds clear to auscultation       Cardiovascular hypertension, Pt. on medications + CAD and + Past MI  Normal cardiovascular exam Rhythm:Regular Rate:Normal  ECG: rate 113. Sinus tachycardia with Premature supraventricular complexes. Left axis deviation Left ventricular hypertrophy   Neuro/Psych CVA, No Residual Symptoms    GI/Hepatic Neg liver ROS, GERD  Medicated and Controlled,  Endo/Other  diabetes, Insulin Dependent  Renal/GU ESRF and DialysisRenal diseaseOn HD M, W, F     Musculoskeletal negative musculoskeletal ROS (+)   Abdominal   Peds  Hematology  (+) anemia ,   Anesthesia Other Findings RIGHT LONG FINGER GANGENE  Reproductive/Obstetrics                           Anesthesia Physical Anesthesia Plan  ASA: IV  Anesthesia Plan: MAC   Post-op Pain Management:    Induction: Intravenous  PONV Risk Score and Plan: Propofol infusion and Treatment may vary due to age or medical condition  Airway Management Planned: Simple Face Mask  Additional Equipment:   Intra-op Plan:   Post-operative Plan:   Informed Consent: I have reviewed the patients History and Physical, chart, labs and discussed the procedure including the risks, benefits and alternatives for the proposed anesthesia with the patient or authorized representative who has indicated his/her understanding and acceptance.   Dental advisory given  Plan Discussed with: CRNA  Anesthesia Plan Comments: (Reviewed PAT note written 02/20/2018 by Myra Gianotti, PA-C. )        Anesthesia Quick Evaluation

## 2018-02-20 NOTE — Progress Notes (Addendum)
Anesthesia Chart Review: Charles Vasquez   Case:  [id number] Date/Time:  02/21/18 0715   Procedure:  RIGHT LONG FINGER REVISION AMPUTATION (Right )   Anesthesia type:  General   Pre-op diagnosis:  RIGHT LONG FINGER GANGENE   [facility]  [facility] 03 / [facility]   Surgeon:  Dairl Ponder, MD      DISCUSSION: Patient is an 82 year old male scheduled for the above procedure.   History includes never smoker, HTN, HLD, ESRD (MWF, [name]), CVA (no residual), DM2, PAD (s/p angioplasty right PTA; 4/17/19s/p right BKA 07/15/17, left BKA 01/02/18), (s/p right index right long finger amputation revision 10/08/17. MI is listed in his history (added to history on 02/04/12 by IR, but no specific details found. No cardiologist. No cardiac testing seen other than EKGs.)  - He is currently being evaluated at [facility] for acute on chronic anemia with reportedly dark/heme positive stools. By notes, he had EGD and colonoscopy 03/2017 that was unrevealing to explain diarrhea but did show moderate gastritis and a single nonbleeding duodenal AVM. Notes anal stricture and was referred to general surgery and had Botx injections by Romie Levee, MD. As of 02/19/18 a patency capsule to be followed by a Givens capsule will be planned, but referral to hematology will also be considered.    He is a resident at [facility].   He has undergone multiple surgeries within the past year including bilateral BKAs. He is a dialysis patient and will need labs on arrival. Further evaluation by his assigned anesthesiologist on the day of surgery.  ADDENDUM 02/20/18 3:36 PM: Received a phone call from Larita Fife at Dr. Ronie Spies office. [facility] staff called their office to notify them of 02/20/18 labs there that showed WBC 12.5, RBC 2.41, H/H 7.0/22.1, MCV 91.7, MCH 29.0, MCHC 31.7, PLT 213. Dr. Mina Marble wanted anesthesiologist to be aware of potential recommendations. Discussed with  anesthesiologist Karna Christmas, MD. Patient with acute on chronic anemia. He will have to be further assessed on the day of surgery but if hemodynamically stable then would anticipate that he could undergo finger amputation revision for gangrene. Currently thought to be out-patient procedure, but patient evaluation findings and same day lab results will be considered and hospitalist can be consulted if felt appropriate. Will order ISTAT4 and Hold Clot.   PROVIDERS: Toma Deiters, MD is listed as PCP Salomon Mast, MD is nephrologist Jonette Eva, MD is GI  LABS: Last labs in [facility] Epic included: (See DISCUSSION for review of 02/20/18 CBC results.)  Lab Results  Component Value Date   WBC 10.2 01/07/2018   HGB 10.1 (L) 01/08/2018   HCT 33.5 (L) 01/08/2018   PLT 261 01/07/2018   GLUCOSE 130 (H) 01/07/2018   ALT 24 07/11/2017   AST 26 07/11/2017   NA 134 (L) 01/07/2018   K 4.8 01/07/2018   CL 95 (L) 01/07/2018   CREATININE 6.91 (H) 01/07/2018   BUN 66 (H) 01/07/2018   CO2 27 01/07/2018   INR 1.12 07/12/2017   HGBA1C 6.6 (H) 10/08/2017    EKG: 07/12/17: ST at 113 bpm. Premature supraventricular complexes. LAD. LVH. Non-specific ST abnormality.   CV: N/A  Past Medical History:  Diagnosis Date  . Anal pain   . AVF (arteriovenous fistula) (HCC) 05-13-2017 per pt currently AVF access used is left thigh   hx multiple AVF surgery's (previously left upper arm, bilateral thigh) last surgery -- right upper arm creation 11-26-2016  . ESRD (  end stage renal disease) on dialysis [facility])    [facility] in [place], [place] on MWF   . Gangrene (HCC)    left leg  . History of CVA (cerebrovascular accident)    05-13-2017  per pt stated "thats what they told yrs ago"  per pt no residual  . Hyperlipidemia   . Hypertension   . Myocardial infarction (HCC)   . Stroke (HCC)   . Type 2 diabetes mellitus treated with insulin (HCC)    Type II    Past Surgical History:  Procedure  Laterality Date  . ABDOMINAL AORTOGRAM W/LOWER EXTREMITY N/A 06/25/2017   Procedure: ABDOMINAL AORTOGRAM W/LOWER EXTREMITY;  Surgeon: Maeola Harman, MD;  Location: [facility] INVASIVE CV LAB;  Service: Cardiovascular;  Laterality: N/A;  . AMPUTATION Right 01/01/2017   Procedure: REVISION AMPUTATION RIGHT INDEX FINGER;  Surgeon: Dairl Ponder, MD;  Location[facility];  Service: Orthopedics;  Laterality: Right;  . AMPUTATION Right 07/15/2017   Procedure: AMPUTATION BELOW KNEE RIGHT;  Surgeon: [name], MD;  Location: [facility] OR;  Service: Vascular;  Laterality: Right;  . AMPUTATION Right 10/08/2017   Procedure: RIGHT INDEX, RIGHT LONG REVISION AMPUTATIONS;  Surgeon: Dairl Ponder, MD;  [facility];  Service: Orthopedics;  Laterality: Right;  . AMPUTATION Left 01/02/2018   Procedure: LEFT BELOW KNEE AMPUTATION;  Surgeon: Nadara Mustard, MD;  Location: [facility] OR;  Service: Orthopedics;  Laterality: Left;  . ARTERIOVENOUS GRAFT PLACEMENT    . ARTERIOVENOUS GRAFT PLACEMENT Left 10/22/2016   thigh  . AV FISTULA PLACEMENT  03/31/2012   Procedure: ARTERIOVENOUS (AV) FISTULA CREATION;  Surgeon: [name], MD;  Location: [facility] OR;  Service: Vascular;  Laterality: Right;  First stage Brachial vein transposition  . AV FISTULA PLACEMENT Right 08/25/2012   Procedure: INSERTION OF ARTERIOVENOUS (AV) GORE-TEX GRAFT ARM;  Surgeon: [name], MD;  [facility];  Service: Vascular;  Laterality: Right;  . AV FISTULA PLACEMENT Left 07/23/2016   Procedure: INSERTION OF ARTERIOVENOUS (AV) GORE-TEX GRAFT LEFT UPPER ARM;  Surgeon: [name], MD;  Location: [facility] OR;  Service: Vascular;  Laterality: Left;  . AV FISTULA PLACEMENT Left 10/22/2016   Procedure: INSERTION OF 4-68mm x 45cm  ARTERIOVENOUS (AV) GORE-TEX GRAFT THIGH-LEFT;  Surgeon: [name], MD;  Location: [facility] OR;  Service: Vascular;  Laterality: Left;  . CATARACT EXTRACTION W/ INTRAOCULAR LENS  IMPLANT, BILATERAL    . COLONOSCOPY N/A 03/25/2017   Dr.  Darrick Penna: examined portion of ileum normal. Redundant left colon, stricture at anus   . ESOPHAGOGASTRODUODENOSCOPY N/A 03/25/2017   Dr. Darrick Penna: widely patent Schatzki ring at GE junction s/p dilation, atrophic gastritis, single duodenal AVM, non-bleeding  . INSERTION OF DIALYSIS CATHETER Left 05/05/2012   Procedure: INSERTION OF DIALYSIS CATHETER;  Surgeon: Chuck Hint, MD;  Location: [facility] OR;  Service: Vascular;  Laterality: Left;  . INSERTION OF DIALYSIS CATHETER Right 10/01/2016   Procedure: INSERTION OF DIALYSIS CATHETER- RIGHT FEMORAL;  Surgeon: [name], MD;  Location: [facility] OR;  Service: Vascular;  Laterality: Right;  . IR FLUORO GUIDE CV LINE RIGHT  06/10/2016  . IR GENERIC HISTORICAL  06/07/2016   IR US GUIDE VASC ACCESS RIGHT 06/07/2016 Oley Balm, MD [facility]-INTER[facility]  . IR THROMBECTOMY AV FISTULA W/THROMBOLYSIS/PTA INC/SHUNT/IMG RIGHT Right 06/07/2016  . IR US GUIDE VASC ACCESS RIGHT  06/10/2016  . LIGATION ARTERIOVENOUS GORTEX GRAFT Left 08/04/2016   Procedure: LIGATION ARTERIOVENOUS GORTEX GRAFT;  Surgeon: Larina Earthly, MD;  [facility];  Service: Vascular;  Laterality: Left;  . LIGATION ARTERIOVENOUS GORTEX GRAFT Right 11/26/2016   Procedure: LIGATION OF RIGHT UPPER ARM ARTERIOVENOUS GORTEX GRAFT;  Surgeon: [name], MD;  Location: [facility] OR;  Service: Vascular;  Laterality: Right;  . LIGATION OF ARTERIOVENOUS  FISTULA Right 08/25/2012   Procedure: LIGATION OF ARTERIOVENOUS  FISTULA;  Surgeon: [name], MD;  Location: [facility] OR;  Service: Vascular;  Laterality: Right;  Ligation of right brachial vein transposition  . PERIPHERAL VASCULAR ATHERECTOMY  06/25/2017   Procedure: PERIPHERAL VASCULAR ATHERECTOMY;  Surgeon: Maeola Harman, MD;  Location: [facility] INVASIVE CV LAB;  Service: Cardiovascular;;  Rt. PT  . REMOVAL OF A DIALYSIS CATHETER Right 05/05/2012   Procedure: REMOVAL OF A DIALYSIS CATHETER;  Surgeon: Chuck Hint, MD;  Location: [facility] OR;  Service: Vascular;   Laterality: Right;  . REMOVAL OF A DIALYSIS CATHETER Right 10/01/2016   Procedure: REMOVAL OF A DIALYSIS CATHETER-RIGHT UPPER CHEST;  Surgeon: [name], MD;  Location: [facility] OR;  Service: Vascular;  Laterality: Right;  . REMOVAL OF A DIALYSIS CATHETER Right 11/26/2016   Procedure: REMOVAL OF RIGHT FEMORAL TUNNEL DIALYSIS CATHETER;  Surgeon: [name], MD;  Location: [facility] OR;  Service: Vascular;  Laterality: Right;  . SPHINCTEROTOMY N/A 05/15/2017   Procedure: POSSIBLE SPHINCTEROTOMY (BOTOX);  Surgeon: Romie Levee, MD;  Location: [facility];  Service: General;  Laterality: N/A;  . TOE AMPUTATION  02/2007   left foot first 3 toes  . UPPER EXTREMITY VENOGRAPHY Bilateral 07/18/2016   Procedure: Bilateral Upper Extremity Venography;  Surgeon: [name], MD;  Location: [facility] INVASIVE CV LAB;  Service: Cardiovascular;  Laterality: Bilateral;    MEDICATIONS: No current facility-administered medications for this encounter.    Marland Kitchen acetaminophen (TYLENOL) 325 MG tablet  . amitriptyline (ELAVIL) 25 MG tablet  . aspirin EC 81 MG tablet  . dicyclomine (BENTYL) 10 MG capsule  . HYDROcodone-acetaminophen (NORCO/VICODIN) 5-325 MG tablet  . insulin lispro (HUMALOG) 100 UNIT/ML injection  . ipratropium (ATROVENT) 0.02 % nebulizer solution  . iron polysaccharides (NIFEREX) 150 MG capsule  . isosorbide mononitrate (IMDUR) 30 MG 24 hr tablet  . losartan (COZAAR) 50 MG tablet  . multivitamin (RENA-VIT) TABS tablet  . pantoprazole (PROTONIX) 40 MG tablet  . polyethylene glycol (MIRALAX / GLYCOLAX) packet  . senna (SENOKOT) 8.6 MG tablet  . sevelamer carbonate (RENVELA) 800 MG tablet  . dextrose (GLUTOSE) 40 % GEL  . insulin aspart (NOVOLOG) 100 UNIT/ML injection   . 0.9 %  sodium chloride infusion (Manually program via Guardrails IV Fluids)    Velna [facility] Short Stay Center/Anesthesiology Phone [phone] 02/20/2018 2:39 PM

## 2018-02-20 NOTE — Telephone Encounter (Addendum)
Spoke to [place] (transporter) at [employer]. Agile scheduled for 03/17/18 at 7:30am. Orders entered. Instructions faxed to nursing home [phone]). Called and informed friend Chirop[employer]ist). Delores requested I call pt's sister Hilda Lias, [phone]). Called and informed Hilda Lias. Hilda Lias requested agile be done sooner d/t he is having problems. Spoke to [place] at endo and [name], Agile rescheduled to 02/24/18 at 7:30am, arrive at 7:00am. [name] changed dates on instructions to give nurse. Called and informed sister of date change to 02/24/18. Order entered for dg abd 1 view to be done day after agile.

## 2018-02-21 ENCOUNTER — Inpatient Hospital Stay (HOSPITAL_COMMUNITY)
Admission: AD | Admit: 2018-02-21 | Discharge: 2018-02-24 | DRG: 255 | Disposition: A | Discharge status: Skilled Nursing Facility | Payer: Medicare Other | Attending: Internal Medicine | Admitting: Internal Medicine

## 2018-02-21 ENCOUNTER — Encounter (HOSPITAL_COMMUNITY)
Admission: AD | Disposition: A | Discharge status: Skilled Nursing Facility | Payer: Self-pay | Source: Home / Self Care | Attending: Internal Medicine

## 2018-02-21 ENCOUNTER — Ambulatory Visit (HOSPITAL_COMMUNITY): Payer: Medicare Other | Admitting: Vascular Surgery

## 2018-02-21 ENCOUNTER — Encounter (HOSPITAL_COMMUNITY): Payer: Self-pay | Admitting: *Deleted

## 2018-02-21 DIAGNOSIS — E1122 Type 2 diabetes mellitus with diabetic chronic kidney disease: Secondary | ICD-10-CM | POA: Diagnosis present

## 2018-02-21 DIAGNOSIS — E1142 Type 2 diabetes mellitus with diabetic polyneuropathy: Secondary | ICD-10-CM | POA: Diagnosis not present

## 2018-02-21 DIAGNOSIS — B964 Proteus (mirabilis) (morganii) as the cause of diseases classified elsewhere: Secondary | ICD-10-CM | POA: Diagnosis present

## 2018-02-21 DIAGNOSIS — K219 Gastro-esophageal reflux disease without esophagitis: Secondary | ICD-10-CM | POA: Diagnosis present

## 2018-02-21 DIAGNOSIS — N2581 Secondary hyperparathyroidism of renal origin: Secondary | ICD-10-CM | POA: Diagnosis not present

## 2018-02-21 DIAGNOSIS — Z833 Family history of diabetes mellitus: Secondary | ICD-10-CM | POA: Diagnosis not present

## 2018-02-21 DIAGNOSIS — S88111A Complete traumatic amputation at level between knee and ankle, right lower leg, initial encounter: Secondary | ICD-10-CM

## 2018-02-21 DIAGNOSIS — E1169 Type 2 diabetes mellitus with other specified complication: Secondary | ICD-10-CM | POA: Diagnosis not present

## 2018-02-21 DIAGNOSIS — N186 End stage renal disease: Secondary | ICD-10-CM | POA: Diagnosis not present

## 2018-02-21 DIAGNOSIS — D638 Anemia in other chronic diseases classified elsewhere: Secondary | ICD-10-CM | POA: Diagnosis present

## 2018-02-21 DIAGNOSIS — Z7982 Long term (current) use of aspirin: Secondary | ICD-10-CM

## 2018-02-21 DIAGNOSIS — D62 Acute posthemorrhagic anemia: Secondary | ICD-10-CM | POA: Diagnosis not present

## 2018-02-21 DIAGNOSIS — Z89511 Acquired absence of right leg below knee: Secondary | ICD-10-CM

## 2018-02-21 DIAGNOSIS — Z8673 Personal history of transient ischemic attack (TIA), and cerebral infarction without residual deficits: Secondary | ICD-10-CM

## 2018-02-21 DIAGNOSIS — Z89512 Acquired absence of left leg below knee: Secondary | ICD-10-CM | POA: Diagnosis present

## 2018-02-21 DIAGNOSIS — M86641 Other chronic osteomyelitis, right hand: Secondary | ICD-10-CM | POA: Diagnosis not present

## 2018-02-21 DIAGNOSIS — E785 Hyperlipidemia, unspecified: Secondary | ICD-10-CM | POA: Diagnosis not present

## 2018-02-21 DIAGNOSIS — I96 Gangrene, not elsewhere classified: Secondary | ICD-10-CM | POA: Diagnosis not present

## 2018-02-21 DIAGNOSIS — I252 Old myocardial infarction: Secondary | ICD-10-CM | POA: Diagnosis not present

## 2018-02-21 DIAGNOSIS — T879 Unspecified complications of amputation stump: Secondary | ICD-10-CM | POA: Diagnosis not present

## 2018-02-21 DIAGNOSIS — E1152 Type 2 diabetes mellitus with diabetic peripheral angiopathy with gangrene: Principal | ICD-10-CM | POA: Diagnosis present

## 2018-02-21 DIAGNOSIS — M869 Osteomyelitis, unspecified: Secondary | ICD-10-CM | POA: Diagnosis not present

## 2018-02-21 DIAGNOSIS — I2510 Atherosclerotic heart disease of native coronary artery without angina pectoris: Secondary | ICD-10-CM | POA: Diagnosis not present

## 2018-02-21 DIAGNOSIS — I120 Hypertensive chronic kidney disease with stage 5 chronic kidney disease or end stage renal disease: Secondary | ICD-10-CM | POA: Diagnosis present

## 2018-02-21 DIAGNOSIS — E8889 Other specified metabolic disorders: Secondary | ICD-10-CM | POA: Diagnosis present

## 2018-02-21 DIAGNOSIS — Z79899 Other long term (current) drug therapy: Secondary | ICD-10-CM

## 2018-02-21 DIAGNOSIS — I10 Essential (primary) hypertension: Secondary | ICD-10-CM | POA: Diagnosis present

## 2018-02-21 DIAGNOSIS — E1129 Type 2 diabetes mellitus with other diabetic kidney complication: Secondary | ICD-10-CM | POA: Diagnosis present

## 2018-02-21 DIAGNOSIS — Z794 Long term (current) use of insulin: Secondary | ICD-10-CM | POA: Diagnosis not present

## 2018-02-21 DIAGNOSIS — E1351 Other specified diabetes mellitus with diabetic peripheral angiopathy without gangrene: Secondary | ICD-10-CM | POA: Diagnosis present

## 2018-02-21 DIAGNOSIS — E1165 Type 2 diabetes mellitus with hyperglycemia: Secondary | ICD-10-CM | POA: Diagnosis present

## 2018-02-21 DIAGNOSIS — Z992 Dependence on renal dialysis: Secondary | ICD-10-CM

## 2018-02-21 DIAGNOSIS — M868X4 Other osteomyelitis, hand: Secondary | ICD-10-CM | POA: Diagnosis not present

## 2018-02-21 DIAGNOSIS — Z8249 Family history of ischemic heart disease and other diseases of the circulatory system: Secondary | ICD-10-CM | POA: Diagnosis not present

## 2018-02-21 DIAGNOSIS — Z8679 Personal history of other diseases of the circulatory system: Secondary | ICD-10-CM

## 2018-02-21 DIAGNOSIS — M79644 Pain in right finger(s): Secondary | ICD-10-CM | POA: Diagnosis not present

## 2018-02-21 DIAGNOSIS — E119 Type 2 diabetes mellitus without complications: Secondary | ICD-10-CM

## 2018-02-21 DIAGNOSIS — K921 Melena: Secondary | ICD-10-CM | POA: Diagnosis present

## 2018-02-21 DIAGNOSIS — R319 Hematuria, unspecified: Secondary | ICD-10-CM | POA: Diagnosis present

## 2018-02-21 HISTORY — PX: AMPUTATION: SHX166

## 2018-02-21 HISTORY — DX: Unspecified osteoarthritis, unspecified site: M19.90

## 2018-02-21 HISTORY — DX: Anemia, unspecified: D64.9

## 2018-02-21 HISTORY — DX: Atherosclerotic heart disease of native coronary artery without angina pectoris: I25.10

## 2018-02-21 HISTORY — DX: Constipation, unspecified: K59.00

## 2018-02-21 LAB — CBC
HCT: 30.1 % — ABNORMAL LOW (ref 39.0–52.0)
Hemoglobin: 9.1 g/dL — ABNORMAL LOW (ref 13.0–17.0)
MCH: 28.3 pg (ref 26.0–34.0)
MCHC: 30.2 g/dL (ref 30.0–36.0)
MCV: 93.5 fL (ref 80.0–100.0)
Platelets: 179 10*3/uL (ref 150–400)
RBC: 3.22 MIL/uL — ABNORMAL LOW (ref 4.22–5.81)
RDW: 17 % — ABNORMAL HIGH (ref 11.5–15.5)
WBC: 10.3 10*3/uL (ref 4.0–10.5)
nRBC: 0 % (ref 0.0–0.2)

## 2018-02-21 LAB — SAMPLE TO BLOOD BANK

## 2018-02-21 LAB — POCT I-STAT 4, (NA,K, GLUC, HGB,HCT)
Glucose, Bld: 198 mg/dL — ABNORMAL HIGH (ref 70–99)
HCT: 22 % — ABNORMAL LOW (ref 39.0–52.0)
Hemoglobin: 7.5 g/dL — ABNORMAL LOW (ref 13.0–17.0)
Potassium: 3.6 mmol/L (ref 3.5–5.1)
Sodium: 136 mmol/L (ref 135–145)

## 2018-02-21 LAB — GLUCOSE, CAPILLARY
GLUCOSE-CAPILLARY: 53 mg/dL — AB (ref 70–99)
Glucose-Capillary: 195 mg/dL — ABNORMAL HIGH (ref 70–99)
Glucose-Capillary: 185 mg/dL — ABNORMAL HIGH (ref 70–99)
Glucose-Capillary: 202 mg/dL — ABNORMAL HIGH (ref 70–99)
Glucose-Capillary: 119 mg/dL — ABNORMAL HIGH (ref 70–99)

## 2018-02-21 LAB — CREATININE, SERUM
Creatinine, Ser: 3.83 mg/dL — ABNORMAL HIGH (ref 0.61–1.24)
GFR calc Af Amer: 16 mL/min — ABNORMAL LOW (ref >60)
GFR calc non Af Amer: 14 mL/min — ABNORMAL LOW (ref >60)

## 2018-02-21 SURGERY — AMPUTATION DIGIT
Anesthesia: Monitor Anesthesia Care | Site: Finger | Laterality: Right

## 2018-02-21 MED ORDER — LOSARTAN POTASSIUM 50 MG PO TABS
50.0000 mg | ORAL_TABLET | Freq: Every day | ORAL | Status: DC
  Administered 2018-02-21 – 2018-02-24 (×4): 50 mg via ORAL
  Filled 2018-02-21 (×4): qty 1

## 2018-02-21 MED ORDER — LIDOCAINE HCL (PF) 1 % IJ SOLN
INTRAMUSCULAR | Status: DC | PRN
  Administered 2018-02-21: 5 mL

## 2018-02-21 MED ORDER — INSULIN GLARGINE 100 UNIT/ML ~~LOC~~ SOLN
5.0000 [IU] | Freq: Every day | SUBCUTANEOUS | Status: DC
  Administered 2018-02-21 – 2018-02-22 (×2): 5 [IU] via SUBCUTANEOUS
  Filled 2018-02-21 (×4): qty 0.05

## 2018-02-21 MED ORDER — CHLORHEXIDINE GLUCONATE 4 % EX LIQD: 60.0000 mL | Freq: Once | CUTANEOUS | Status: DC

## 2018-02-21 MED ORDER — DICYCLOMINE HCL 10 MG PO CAPS: 10.0000 mg | ORAL_CAPSULE | ORAL | Status: DC | PRN

## 2018-02-21 MED ORDER — PANTOPRAZOLE SODIUM 40 MG PO TBEC
40.0000 mg | DELAYED_RELEASE_TABLET | Freq: Every day | ORAL | Status: DC
  Administered 2018-02-21 – 2018-02-24 (×4): 40 mg via ORAL
  Filled 2018-02-21 (×4): qty 1

## 2018-02-21 MED ORDER — SODIUM CHLORIDE 0.9 % IV SOLN
INTRAVENOUS | Status: DC | PRN
  Administered 2018-02-21: 08:00:00 via INTRAVENOUS

## 2018-02-21 MED ORDER — DEXTROSE 50 % IV SOLN
INTRAVENOUS | Status: AC
  Administered 2018-02-21: 50 mL
  Filled 2018-02-21: qty 50

## 2018-02-21 MED ORDER — ONDANSETRON HCL 4 MG/2ML IJ SOLN: 4.0000 mg | Freq: Once | INTRAMUSCULAR | Status: DC | PRN

## 2018-02-21 MED ORDER — PROPOFOL 500 MG/50ML IV EMUL
INTRAVENOUS | Status: DC | PRN
  Administered 2018-02-21: 25 ug/kg/min via INTRAVENOUS

## 2018-02-21 MED ORDER — SENNA 8.6 MG PO TABS
2.0000 | ORAL_TABLET | Freq: Every day | ORAL | Status: DC | PRN
  Filled 2018-02-21: qty 2

## 2018-02-21 MED ORDER — PROPOFOL 10 MG/ML IV BOLUS
INTRAVENOUS | Status: AC
  Filled 2018-02-21: qty 20

## 2018-02-21 MED ORDER — ACETAMINOPHEN 325 MG PO TABS
325.0000 mg | ORAL_TABLET | Freq: Four times a day (QID) | ORAL | Status: DC | PRN
  Administered 2018-02-23: 325 mg via ORAL
  Filled 2018-02-21: qty 1

## 2018-02-21 MED ORDER — CEFAZOLIN SODIUM-DEXTROSE 2-4 GM/100ML-% IV SOLN
2.0000 g | INTRAVENOUS | Status: AC
  Administered 2018-02-21: 2 g via INTRAVENOUS
  Filled 2018-02-21: qty 100

## 2018-02-21 MED ORDER — AMITRIPTYLINE HCL 25 MG PO TABS
25.0000 mg | ORAL_TABLET | Freq: Every day | ORAL | Status: DC
  Administered 2018-02-21 – 2018-02-23 (×3): 25 mg via ORAL
  Filled 2018-02-21 (×3): qty 1

## 2018-02-21 MED ORDER — FENTANYL CITRATE (PF) 100 MCG/2ML IJ SOLN: 25.0000 ug | INTRAMUSCULAR | Status: DC | PRN

## 2018-02-21 MED ORDER — FENTANYL CITRATE (PF) 250 MCG/5ML IJ SOLN
INTRAMUSCULAR | Status: AC
  Filled 2018-02-21: qty 5

## 2018-02-21 MED ORDER — HEPARIN SODIUM (PORCINE) 5000 UNIT/ML IJ SOLN
5000.0000 [IU] | Freq: Three times a day (TID) | INTRAMUSCULAR | Status: DC
  Administered 2018-02-21 – 2018-02-24 (×9): 5000 [IU] via SUBCUTANEOUS
  Filled 2018-02-21 (×9): qty 1

## 2018-02-21 MED ORDER — RENA-VITE PO TABS
1.0000 | ORAL_TABLET | Freq: Every day | ORAL | Status: DC
  Administered 2018-02-21 – 2018-02-24 (×4): 1 via ORAL
  Filled 2018-02-21 (×4): qty 1

## 2018-02-21 MED ORDER — 0.9 % SODIUM CHLORIDE (POUR BTL) OPTIME
TOPICAL | Status: DC | PRN
  Administered 2018-02-21: 1000 mL

## 2018-02-21 MED ORDER — BUPIVACAINE HCL (PF) 0.25 % IJ SOLN
INTRAMUSCULAR | Status: AC
  Filled 2018-02-21: qty 30

## 2018-02-21 MED ORDER — POLYSACCHARIDE IRON COMPLEX 150 MG PO CAPS
150.0000 mg | ORAL_CAPSULE | Freq: Two times a day (BID) | ORAL | Status: DC
  Administered 2018-02-21 – 2018-02-24 (×7): 150 mg via ORAL
  Filled 2018-02-21 (×7): qty 1

## 2018-02-21 MED ORDER — FENTANYL CITRATE (PF) 100 MCG/2ML IJ SOLN
INTRAMUSCULAR | Status: DC | PRN
  Administered 2018-02-21: 25 ug via INTRAVENOUS

## 2018-02-21 MED ORDER — IPRATROPIUM BROMIDE 0.02 % IN SOLN: 0.4000 mg | Freq: Four times a day (QID) | RESPIRATORY_TRACT | Status: DC | PRN

## 2018-02-21 MED ORDER — BUPIVACAINE HCL (PF) 0.25 % IJ SOLN
INTRAMUSCULAR | Status: DC | PRN
  Administered 2018-02-21: 5 mL

## 2018-02-21 MED ORDER — LIDOCAINE HCL (PF) 1 % IJ SOLN
INTRAMUSCULAR | Status: AC
  Filled 2018-02-21: qty 30

## 2018-02-21 MED ORDER — SEVELAMER CARBONATE 800 MG PO TABS
1600.0000 mg | ORAL_TABLET | Freq: Three times a day (TID) | ORAL | Status: DC
  Administered 2018-02-21 – 2018-02-24 (×9): 1600 mg via ORAL
  Filled 2018-02-21 (×9): qty 2

## 2018-02-21 MED ORDER — HYDROCODONE-ACETAMINOPHEN 5-325 MG PO TABS
1.0000 | ORAL_TABLET | Freq: Four times a day (QID) | ORAL | Status: DC | PRN
  Administered 2018-02-21 – 2018-02-22 (×2): 1 via ORAL
  Filled 2018-02-21 (×2): qty 1

## 2018-02-21 MED ORDER — INSULIN ASPART 100 UNIT/ML ~~LOC~~ SOLN
0.0000 [IU] | Freq: Three times a day (TID) | SUBCUTANEOUS | Status: DC
  Administered 2018-02-21: 2 [IU] via SUBCUTANEOUS
  Administered 2018-02-21: 3 [IU] via SUBCUTANEOUS
  Administered 2018-02-22: 1 [IU] via SUBCUTANEOUS

## 2018-02-21 MED ORDER — ISOSORBIDE MONONITRATE ER 30 MG PO TB24
30.0000 mg | ORAL_TABLET | Freq: Every day | ORAL | Status: DC
  Administered 2018-02-21 – 2018-02-24 (×4): 30 mg via ORAL
  Filled 2018-02-21 (×4): qty 1

## 2018-02-21 MED ORDER — BUPIVACAINE-EPINEPHRINE (PF) 0.25% -1:200000 IJ SOLN
INTRAMUSCULAR | Status: AC
  Filled 2018-02-21: qty 30

## 2018-02-21 SURGICAL SUPPLY — 33 items
BANDAGE ACE 4X5 VEL STRL LF (GAUZE/BANDAGES/DRESSINGS) ×3 IMPLANT
BNDG CMPR 9X4 STRL LF SNTH (GAUZE/BANDAGES/DRESSINGS) ×1
BNDG ELASTIC 2X5.8 VLCR STR LF (GAUZE/BANDAGES/DRESSINGS) ×3 IMPLANT
BNDG ESMARK 4X9 LF (GAUZE/BANDAGES/DRESSINGS) ×3 IMPLANT
BNDG GAUZE ELAST 4 BULKY (GAUZE/BANDAGES/DRESSINGS) ×3 IMPLANT
CORDS BIPOLAR (ELECTRODE) ×3 IMPLANT
COVER SURGICAL LIGHT HANDLE (MISCELLANEOUS) ×3 IMPLANT
COVER WAND RF STERILE (DRAPES) ×3 IMPLANT
CUFF TOURNIQUET SINGLE 18IN (TOURNIQUET CUFF) ×3 IMPLANT
DRAPE SURG 17X23 STRL (DRAPES) ×3 IMPLANT
GAUZE SPONGE 4X4 12PLY STRL (GAUZE/BANDAGES/DRESSINGS) ×3 IMPLANT
GAUZE XEROFORM 1X8 LF (GAUZE/BANDAGES/DRESSINGS) ×3 IMPLANT
GLOVE SURG SYN 8.0 (GLOVE) ×3 IMPLANT
GOWN STRL REUS W/ TWL LRG LVL3 (GOWN DISPOSABLE) ×1 IMPLANT
GOWN STRL REUS W/ TWL XL LVL3 (GOWN DISPOSABLE) ×1 IMPLANT
GOWN STRL REUS W/TWL LRG LVL3 (GOWN DISPOSABLE) ×3
GOWN STRL REUS W/TWL XL LVL3 (GOWN DISPOSABLE) ×3
KIT BASIN OR (CUSTOM PROCEDURE TRAY) ×3 IMPLANT
KIT TURNOVER KIT B (KITS) ×3 IMPLANT
NEEDLE HYPO 25GX1X1/2 BEV (NEEDLE) ×3 IMPLANT
NS IRRIG 1000ML POUR BTL (IV SOLUTION) ×3 IMPLANT
PACK ORTHO EXTREMITY (CUSTOM PROCEDURE TRAY) ×3 IMPLANT
PAD ARMBOARD 7.5X6 YLW CONV (MISCELLANEOUS) ×6 IMPLANT
PAD CAST 4YDX4 CTTN HI CHSV (CAST SUPPLIES) ×1 IMPLANT
PADDING CAST COTTON 4X4 STRL (CAST SUPPLIES) ×3
SUT MNCRL AB 4-0 PS2 18 (SUTURE) ×6 IMPLANT
SYR CONTROL 10ML LL (SYRINGE) ×3 IMPLANT
TOWEL OR 17X24 6PK STRL BLUE (TOWEL DISPOSABLE) ×3 IMPLANT
TOWEL OR 17X26 10 PK STRL BLUE (TOWEL DISPOSABLE) ×3 IMPLANT
TUBE CONNECTING 12'X1/4 (SUCTIONS) ×1
TUBE CONNECTING 12X1/4 (SUCTIONS) ×2 IMPLANT
UNDERPAD 30X30 (UNDERPADS AND DIAPERS) ×3 IMPLANT
WATER STERILE IRR 1000ML POUR (IV SOLUTION) ×3 IMPLANT

## 2018-02-21 NOTE — H&P (Signed)
History and Physical  JAYQUAN GOTSHALL DUK:[id number] DOB: [birth year] DOA: 02/21/2018  Referring physician:   Dairl Ponder, MD      PCP: Toma Deiters, MD  [facility] Patient coming from: Home & is able to ambulate BKA bilaterally  Chief Complaint: Long finger infection with gangrene  HPI: Charles Vasquez is a 82 y.o. 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  I was called to admit patient and orthopedic surgeon due to his comorbidity.   ED Course: None  Review of Systems:  . Pt complains of unable to state patient under influence of post anesthesia  Pt denies any unable to state patient under influence of postanesthesia.  Review of systems are otherwise negative   Past Medical History:  Diagnosis Date  . Anal pain   . Anemia   . AVF (arteriovenous fistula) (HCC) 05-13-2017 per pt currently AVF access used is left thigh   hx multiple AVF surgery's (previously left upper arm, bilateral thigh) last surgery -- right upper arm creation 11-26-2016  . Constipation   . Coronary artery disease   . ESRD (end stage renal disease) on dialysis [facility])    [facility] in [place], [place] on MWF   . Gangrene (HCC)    left leg  . History of CVA (cerebrovascular accident)    05-13-2017  per pt stated "thats what they told yrs ago"  per pt no residual  . Hyperlipidemia   . Hypertension   . Myocardial infarction (HCC)   . Osteoarthritis   . Stroke (HCC)   . Type 2 diabetes mellitus treated with insulin (HCC)    Type II   Past Surgical History:  Procedure Laterality Date  . ABDOMINAL AORTOGRAM W/LOWER EXTREMITY N/A 06/25/2017   Procedure: ABDOMINAL AORTOGRAM W/LOWER EXTREMITY;  Surgeon: Maeola Harman, MD;  Location: [facility] INVASIVE CV LAB;  Service: Cardiovascular;  Laterality: N/A;  . AMPUTATION Right 01/01/2017   Procedure: REVISION AMPUTATION RIGHT INDEX FINGER;  Surgeon: Dairl Ponder, MD;  Location: [facility] OR;  Service: Orthopedics;  Laterality: Right;  . AMPUTATION Right 07/15/2017   Procedure: AMPUTATION BELOW KNEE RIGHT;  Surgeon: [name], MD;  Location: [facility] OR;  Service: Vascular;  Laterality: Right;  . AMPUTATION Right 10/08/2017   Procedure: RIGHT INDEX, RIGHT LONG REVISION AMPUTATIONS;  Surgeon: Dairl Ponder, MD;  Location: [facility] OR;  Service: Orthopedics;  Laterality: Right;  . AMPUTATION Left 01/02/2018   Procedure: LEFT BELOW KNEE AMPUTATION;  Surgeon: Nadara Mustard, MD;  Location: [facility] OR;  Service: Orthopedics;  Laterality: Left;  . ARTERIOVENOUS GRAFT PLACEMENT    . ARTERIOVENOUS GRAFT PLACEMENT Left 10/22/2016   thigh  . AV FISTULA PLACEMENT  03/31/2012   Procedure: ARTERIOVENOUS (AV) FISTULA CREATION;  Surgeon: [name], MD;  Location: [facility] OR;  Service: Vascular;  Laterality: Right;  First stage Brachial vein transposition  . AV FISTULA PLACEMENT Right 08/25/2012   Procedure: INSERTION OF ARTERIOVENOUS (AV) GORE-TEX GRAFT ARM;  Surgeon: [name], MD;  Location: [facility] OR;  Service: Vascular;  Laterality: Right;  . AV FISTULA PLACEMENT Left 07/23/2016   Procedure: INSERTION OF ARTERIOVENOUS (AV) GORE-TEX GRAFT LEFT UPPER ARM;  Surgeon: [name], MD;  Location: [facility] OR;  Service:  Vascular;  Laterality: Left;  . AV FISTULA PLACEMENT Left 10/22/2016   Procedure: INSERTION OF 4-10mm x 45cm  ARTERIOVENOUS (AV) GORE-TEX GRAFT THIGH-LEFT;  Surgeon: [name], MD;  Location: [facility] OR;  Service: Vascular;  Laterality: Left;  . CATARACT EXTRACTION W/ INTRAOCULAR LENS  IMPLANT, BILATERAL    . COLONOSCOPY N/A 03/25/2017   Dr. Darrick Penna: examined portion of ileum normal. Redundant left colon, stricture  at anus   . ESOPHAGOGASTRODUODENOSCOPY N/A 03/25/2017   Dr. Darrick Penna: widely patent Schatzki ring at GE junction s/p dilation, atrophic gastritis, single duodenal AVM, non-bleeding  . INSERTION OF DIALYSIS CATHETER Left 05/05/2012   Procedure: INSERTION OF DIALYSIS CATHETER;  Surgeon: Chuck Hint, MD;  Location: [facility] OR;  Service: Vascular;  Laterality: Left;  . INSERTION OF DIALYSIS CATHETER Right 10/01/2016   Procedure: INSERTION OF DIALYSIS CATHETER- RIGHT FEMORAL;  Surgeon: [name], MD;  Location: [facility] OR;  Service: Vascular;  Laterality: Right;  . IR FLUORO GUIDE CV LINE RIGHT  06/10/2016  . IR GENERIC HISTORICAL  06/07/2016   IR US GUIDE VASC ACCESS RIGHT 06/07/2016 Oley Balm, MD [facility]-INTERV RAD  . IR THROMBECTOMY AV FISTULA W/THROMBOLYSIS/PTA INC/SHUNT/IMG RIGHT Right 06/07/2016  . IR US GUIDE VASC ACCESS RIGHT  06/10/2016  . LIGATION ARTERIOVENOUS GORTEX GRAFT Left 08/04/2016   Procedure: LIGATION ARTERIOVENOUS GORTEX GRAFT;  Surgeon: Larina Earthly, MD;  Location: [facility] OR;  Service: Vascular;  Laterality: Left;  . LIGATION ARTERIOVENOUS GORTEX GRAFT Right 11/26/2016   Procedure: LIGATION OF RIGHT UPPER ARM ARTERIOVENOUS GORTEX GRAFT;  Surgeon: [name], MD;  Location: [facility] OR;  Service: Vascular;  Laterality: Right;  . LIGATION OF ARTERIOVENOUS  FISTULA Right 08/25/2012   Procedure: LIGATION OF ARTERIOVENOUS  FISTULA;  Surgeon: [name], MD;  Location: [facility] OR;  Service: Vascular;  Laterality: Right;  Ligation of right brachial vein transposition  . PERIPHERAL VASCULAR ATHERECTOMY  06/25/2017   Procedure: PERIPHERAL VASCULAR ATHERECTOMY;  Surgeon: Maeola Harman, MD;  Location: [facility] INVASIVE CV LAB;  Service: Cardiovascular;;  Rt. PT  . REMOVAL OF A DIALYSIS CATHETER Right 05/05/2012   Procedure: REMOVAL OF A DIALYSIS CATHETER;  Surgeon: Chuck Hint, MD;  Location: [facility] OR;  Service: Vascular;  Laterality: Right;  . REMOVAL OF A DIALYSIS CATHETER Right 10/01/2016    Procedure: REMOVAL OF A DIALYSIS CATHETER-RIGHT UPPER CHEST;  Surgeon: [name], MD;  Location: [facility] OR;  Service: Vascular;  Laterality: Right;  . REMOVAL OF A DIALYSIS CATHETER Right 11/26/2016   Procedure: REMOVAL OF RIGHT FEMORAL TUNNEL DIALYSIS CATHETER;  Surgeon: [name], MD;  Location: [facility] OR;  Service: Vascular;  Laterality: Right;  . SPHINCTEROTOMY N/A 05/15/2017   Procedure: POSSIBLE SPHINCTEROTOMY (BOTOX);  Surgeon: Romie Levee, MD;  Location: [facility];  Service: General;  Laterality: N/A;  . TOE AMPUTATION  02/2007   left foot first 3 toes  . UPPER EXTREMITY VENOGRAPHY Bilateral 07/18/2016   Procedure: Bilateral Upper Extremity Venography;  Surgeon: [name], MD;  Location: [facility] INVASIVE CV LAB;  Service: Cardiovascular;  Laterality: Bilateral;    Social History:  reports that he has never smoked. He has never used smokeless tobacco. He reports that he does not drink alcohol or use drugs.   Allergies  Allergen Reactions  . Lisinopril Other (See Comments)    HYPERKALEMIA RENAL DYSFUNCTION PROGRESSION     Family History  Problem Relation Age of Onset  . Diabetes Mother   . Hypertension Mother   .  AAA (abdominal aortic aneurysm) Mother   . Diabetes Father   . Hypertension Father       Prior to Admission medications   Medication Sig Start Date End Date Taking? Authorizing Provider  acetaminophen (TYLENOL) 325 MG tablet Take 325 mg by mouth 4 (four) times daily as needed for mild pain or fever.   Yes [provider]  amitriptyline (ELAVIL) 25 MG tablet Take 25 mg by mouth daily at 8 pm.    Yes [provider]  aspirin EC 81 MG tablet Take 81 mg by mouth daily.   Yes [provider]  dicyclomine (BENTYL) 10 MG capsule 1 PO 30 MINUTES PRIOR TO BREAKFAST AND LUNCH Patient taking differently: Take 10 mg by mouth as needed (diarrhea).  03/25/17  Yes Fields, Darleene Cleaver, MD  HYDROcodone-acetaminophen (NORCO/VICODIN) 5-325 MG  tablet Take 1 tablet by mouth every 4 (four) hours as needed for moderate pain. Patient taking differently: Take 1 tablet by mouth every 6 (six) hours as needed for moderate pain.  01/06/18  Yes Nadara Mustard, MD  insulin aspart (NOVOLOG) 100 UNIT/ML injection Inject into the skin 3 (three) times daily before meals. Sliding scale   Yes [provider]  insulin lispro (HUMALOG) 100 UNIT/ML injection Inject 0-8 Units into the skin 3 (three) times daily before meals. Blood sugar of 70-100=0 units 101-250=1 unit 151-200=2 units 201-250=4 units  251-300=6 units 301-350= 8 units Greater than 350= 10 units **Call MD if glucose greater than 450**   Yes [provider]  iron polysaccharides (NIFEREX) 150 MG capsule Take 1 capsule (150 mg total) by mouth 2 (two) times daily. 07/30/17  Yes Angiulli, Mcarthur Rossetti, PA-C  isosorbide mononitrate (IMDUR) 30 MG 24 hr tablet Take 30 mg by mouth daily.   Yes [provider]  losartan (COZAAR) 50 MG tablet Take 50 mg by mouth daily.    Yes [provider]  multivitamin (RENA-VIT) TABS tablet Take 1 tablet by mouth daily. 07/30/17  Yes Angiulli, Mcarthur Rossetti, PA-C  pantoprazole (PROTONIX) 40 MG tablet Take 1 tablet (40 mg total) by mouth 2 (two) times daily with a meal. Patient taking differently: Take 40 mg by mouth daily as needed (gerd).  07/30/17  Yes Angiulli, Mcarthur Rossetti, PA-C  polyethylene glycol (MIRALAX / GLYCOLAX) packet Take 17 g by mouth as needed for moderate constipation.   Yes [provider]  senna (SENOKOT) 8.6 MG tablet Take 2 tablets by mouth daily as needed for constipation.   Yes [provider]  sevelamer carbonate (RENVELA) 800 MG tablet Take 2 tablets (1,600 mg total) by mouth 3 (three) times daily with meals. Patient taking differently: Take 1,600 mg by mouth 3 (three) times daily with meals. (0800, 1200, 1700) 07/30/17  Yes Angiulli, Mcarthur Rossetti, PA-C  dextrose (GLUTOSE) 40 % GEL Take 1 Tube by mouth  once as needed for low blood sugar.    [provider]  ipratropium (ATROVENT) 0.02 % nebulizer solution Take 0.4 mg by nebulization every 6 (six) hours as needed (congestion).    [provider]    Physical Exam: BP (!) 121/49   Pulse 66   Temp (!) 97.2 F (36.2 C)   Resp 19   Ht 5\' 7"  (1.702 m)   Wt 49.4 kg   SpO2 98%   BMI 17.07 kg/m   Exam:  . General: 82 y.o. year-old male well developed well nourished in no acute distress.  Alert and oriented x3. . Cardiovascular: Regular rate  and rhythm with no rubs or gallops.  No thyromegaly or JVD noted.   Marland Kitchen Respiratory: Clear to auscultation with no wheezes or rales. Good inspiratory effort. . Abdomen: Soft nontender nondistended with normal bowel sounds x4 quadrants. . Musculoskeletal: No lower extremity edema.  Bilateral below-knee amputation.  The left stump is in the dressing and there is some foul order.  His right arm is in dressing with partial amputation of the long finger. . Skin: No ulcerative lesions noted or rashes, . Psychiatry: Mood is appropriate for condition and setting           Labs on Admission:  Basic Metabolic Panel: Recent Labs  Lab 02/21/18 0644  NA 136  K 3.6  GLUCOSE 198*   Liver Function Tests: No results for input(s): AST, ALT, ALKPHOS, BILITOT, PROT, ALBUMIN in the last 168 hours. No results for input(s): LIPASE, AMYLASE in the last 168 hours. No results for input(s): AMMONIA in the last 168 hours. CBC: Recent Labs  Lab 02/21/18 0644  HGB 7.5*  HCT 22.0*   Cardiac Enzymes: No results for input(s): CKTOTAL, CKMB, CKMBINDEX, TROPONINI in the last 168 hours.  BNP (last 3 results) No results for input(s): BNP in the last 8760 hours.  ProBNP (last 3 results) No results for input(s): PROBNP in the last 8760 hours.  CBG: Recent Labs  Lab 02/21/18 0908  GLUCAP 195*    Radiological Exams on Admission: No results found.  EKG: Independently reviewed.   None  Assessment/Plan Present on Admission: . Essential hypertension . GERD . End stage renal disease (HCC) . HEMATURIA UNSPECIFIED . Melena . Type II diabetes mellitus with renal manifestations (HCC) . Anemia of chronic disease . Type 2 diabetes mellitus with peripheral neuropathy (HCC) . Left below-knee amputee (HCC) . Osteomyelitis of finger of right hand (HCC) . CAD (coronary artery disease) . Peripheral vascular disease due to secondary diabetes mellitus (HCC)  Principal Problem:   Osteomyelitis of finger of right hand (HCC) Active Problems:   Diabetes (HCC)   Essential hypertension   GERD   HEMATURIA UNSPECIFIED   History of cardiovascular disorder   End stage renal disease (HCC)   ESRD (end stage renal disease) on dialysis (HCC)   Melena   CAD (coronary artery disease)   Type II diabetes mellitus with renal manifestations (HCC)   Amputation of right lower extremity below knee upon examination (HCC)   Anemia of chronic disease   Type 2 diabetes mellitus with peripheral neuropathy (HCC)   Left below-knee amputee (HCC)   Peripheral vascular disease due to secondary diabetes mellitus (HCC)  1.  Osteomyelitis of the right finger status post surgery orthopedic doctor is following  2.  Type 2 diabetes mellitus uncontrolled continue home medication  3.  Anemia most likely of chronic disease we will monitor Hemoccult stool was  ordered.  Patient was said to have been transfused with blood on Monday his hemoglobin today is 7.5  4.  Bilateral below-knee amputation with ulcer  5.  End-stage renal disease on hemodialysis Monday Wednesday and Friday  Severity of Illness: The appropriate patient status for this patient is OBSERVATION. Observation status is judged to be reasonable and necessary in order to provide the required intensity of service to ensure the patient's safety. The patient's presenting symptoms, physical exam findings, and initial radiographic and laboratory  data in the context of their medical condition is felt to place them at decreased risk for further clinical deterioration. Furthermore, it is anticipated that the patient will  be medically stable for discharge from the hospital within 2 midnights of admission. The following factors support the patient status of observation.   " The patient's presenting symptoms include status post long finger infection status post OR for drainage. " The physical exam findings include anemia. " The initial radiographic and laboratory data are cellulitis.     DVT prophylaxis: Lovenox  Code Status: Full code  Family Communication: None at bedside  Disposition Plan: Home when stable in 24 hours  Consults called: Orthopedic Dr. Mina Marble  Admission status: Observation 24 hours    Myrtie Neither MD [facility] Pager [phone]  If 7PM-7AM, please contact night-coverage [email or web address] Password [email or web address]  02/21/2018, 10:55 AM

## 2018-02-21 NOTE — Anesthesia Postprocedure Evaluation (Signed)
Anesthesia Post Note  Patient: LADAINIAN THERIEN  Procedure(s) Performed: RIGHT LONG FINGER REVISION AMPUTATION (Right Finger)     Patient location during evaluation: PACU Anesthesia Type: MAC Level of consciousness: awake Pain management: pain level controlled Vital Signs Assessment: post-procedure vital signs reviewed and stable Respiratory status: spontaneous breathing, nonlabored ventilation, respiratory function stable and patient connected to nasal cannula oxygen Cardiovascular status: stable and blood pressure returned to baseline Postop Assessment: no apparent nausea or vomiting Anesthetic complications: no    Last Vitals:  Vitals:   02/21/18 1103 02/21/18 1144  BP: (!) 149/61 (!) 152/65  Pulse: 72 73  Resp: 19 18  Temp:  (!) 36.3 C  SpO2: 100% 100%    Last Pain:  Vitals:   02/21/18 1410  TempSrc:   PainSc: 9                  Nariya Neumeyer P Correy Weidner

## 2018-02-21 NOTE — Progress Notes (Signed)
Hypoglycemic Event  CBG: 53  Treatment: D50 50 mL (25 gm)  Symptoms: Sweaty  Follow-up CBG: Time:2145 CBG Result:134  Possible Reasons for Event: Inadequate meal intake  Comments/MD notified:[id number] [facility] paged    Ronie Spies

## 2018-02-21 NOTE — Op Note (Signed)
Please see dictated report [phone]

## 2018-02-21 NOTE — Plan of Care (Signed)
  Problem: Education: Goal: Knowledge of General Education information will improve Description: Including pain rating scale, medication(s)/side effects and non-pharmacologic comfort measures Outcome: Progressing   Problem: Clinical Measurements: Goal: Ability to maintain clinical measurements within normal limits will improve Outcome: Progressing   Problem: Activity: Goal: Risk for activity intolerance will decrease Outcome: Progressing   Problem: Nutrition: Goal: Adequate nutrition will be maintained Outcome: Progressing   Problem: Pain Managment: Goal: General experience of comfort will improve Outcome: Progressing   Problem: Safety: Goal: Ability to remain free from injury will improve Outcome: Progressing   Problem: Skin Integrity: Goal: Risk for impaired skin integrity will decrease Outcome: Progressing   

## 2018-02-21 NOTE — Progress Notes (Signed)
Last dose of medications obtained from [facility]. Patient able to provide other information.

## 2018-02-21 NOTE — Op Note (Signed)
[name], Charles Vasquez MEDICAL RECORD AN:[id number] ACCOUNT [id number] DATE OF BIRTH:1935-03-12 FACILITY: [facility] LOCATION: [facility] PHYSICIAN:Filippo Puls A. Mina Marble, MD  OPERATIVE REPORT  DATE OF PROCEDURE:  02/21/2018  PREOPERATIVE DIAGNOSIS:  Chronic gangrene, right long finger.  POSTOPERATIVE DIAGNOSIS:  Chronic gangrene, right long finger.  PROCEDURE:  Incision and drainage above with metacarpophalangeal joint level amputation with fillet flap closure.  SURGEON:  Dairl Ponder, MD  ASSISTANT:  None.  ANESTHESIA:  Monitored anesthesia care with a median nerve block performed by the surgeon.  COMPLICATIONS:  None.  DRAINS:  No drains.  SPECIMENS:  One specimen sent.  DESCRIPTION OF PROCEDURE:  The patient was taken to the operating suite after induction of adequate IV sedation.  The right upper extremity was prepped and draped in the usual sterile fashion.  I injected 5 mL of 1% lidocaine plain and 0.25% plain  Marcaine into the carpal canal and then 5 mL around the base of the long finger.  We then exsanguinated the limb and raised the tourniquet to 220 mmHg at the forearm level.  We made a midlateral incision along the ulnar side and encountered significant  purulence under a necrotic skin flap that was cultured for aerobic, anaerobic and Gram stain.  We then disarticulated the PIP joint and then created a fillet flap and disarticulated the proximal phalanx at the metacarpophalangeal joint level.  We removed  all nonviable skin sharply and identified the neurovascular bundles and performed bilateral neurectomies and cauterized the blood vessels.  We then closed the webspace using the fillet flap from dorsal to volar, closing with 4-0 Monocryl.  We excised  the extensor and flexor tendons as well.  The wound was then dressed with Xeroform, 4 x 4's, and a compression bandage.  The patient tolerated this procedure well and went to recovery room in stable fashion.  TN/NUANCE   D:02/21/2018 T:02/21/2018 JOB:[id number]

## 2018-02-21 NOTE — Transfer of Care (Signed)
Immediate Anesthesia Transfer of Care Note  Patient: Charles Vasquez  Procedure(s) Performed: RIGHT LONG FINGER REVISION AMPUTATION (Right Finger)  Patient Location: PACU  Anesthesia Type:MAC  Level of Consciousness: drowsy  Airway & Oxygen Therapy: Patient Spontanous Breathing and Patient connected to nasal cannula oxygen  Post-op Assessment: Report given to RN and Post -op Vital signs reviewed and stable  Post vital signs: Reviewed and stable  Last Vitals:  Vitals Value Taken Time  BP 117/53 02/21/2018  9:08 AM  Temp    Pulse 72 02/21/2018  9:09 AM  Resp 22 02/21/2018  9:09 AM  SpO2 100 % 02/21/2018  9:09 AM  Vitals shown include unvalidated device data.  Last Pain:  Vitals:   02/21/18 0640  TempSrc: Oral      Patients Stated Pain Goal: 3 (16/42/90 3795)  Complications: No apparent anesthesia complications

## 2018-02-21 NOTE — Progress Notes (Signed)
Follow call to Dr Austin Miles regarding admit order- Dr in progress of placing orders. - pt to go to 5N22 - family updated in waiting area- sent up to 5N- pt's w/c & belongings placed in pt's room on 5N 22 per verbal request by family

## 2018-02-21 NOTE — Anesthesia Procedure Notes (Signed)
Procedure Name: MAC Date/Time: 02/21/2018 7:59 AM Performed by: Kyung Rudd, CRNA Pre-anesthesia Checklist: Patient identified, Emergency Drugs available, Suction available and Patient being monitored Patient Re-evaluated:Patient Re-evaluated prior to induction Oxygen Delivery Method: Simple face mask Induction Type: IV induction Placement Confirmation: positive ETCO2

## 2018-02-21 NOTE — H&P (Signed)
Charles Vasquez is an 82 y.o. male.   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  Past Medical History:  Diagnosis Date  . Anal pain   . Anemia   . AVF (arteriovenous fistula) (HCC) 05-13-2017 per pt currently AVF access used is left thigh   hx multiple AVF surgery's (previously left upper arm, bilateral thigh) last surgery -- right upper arm creation 11-26-2016  . Constipation   . Coronary artery disease   . ESRD (end stage renal disease) on dialysis [facility])    [facility] in [place], [place] on MWF   . Gangrene (HCC)    left leg  . History of CVA (cerebrovascular accident)    05-13-2017  per pt stated "thats what they told yrs ago"  per pt no residual  . Hyperlipidemia   . Hypertension   . Myocardial infarction (HCC)   . Osteoarthritis   . Stroke (HCC)   . Type 2 diabetes mellitus treated with insulin (HCC)    Type II    Past Surgical History:  Procedure Laterality Date  . ABDOMINAL AORTOGRAM W/LOWER EXTREMITY N/A 06/25/2017   Procedure: ABDOMINAL AORTOGRAM W/LOWER EXTREMITY;  Surgeon: Maeola Harman, MD;  Location: [facility] INVASIVE CV LAB;  Service: Cardiovascular;  Laterality: N/A;  . AMPUTATION Right 01/01/2017   Procedure: REVISION AMPUTATION RIGHT INDEX FINGER;  Surgeon: Dairl Ponder, MD;  Location: [facility] OR;  Service: Orthopedics;  Laterality: Right;  . AMPUTATION Right 07/15/2017   Procedure: AMPUTATION BELOW KNEE RIGHT;  Surgeon: [name], MD;  Location: [facility] OR;  Service: Vascular;  Laterality: Right;  . AMPUTATION Right 10/08/2017   Procedure: RIGHT INDEX, RIGHT LONG REVISION AMPUTATIONS;  Surgeon: Dairl Ponder, MD;  Location: [facility] OR;  Service: Orthopedics;  Laterality: Right;  . AMPUTATION Left 01/02/2018   Procedure: LEFT BELOW KNEE AMPUTATION;  Surgeon: Nadara Mustard, MD;  Location:  [facility] OR;  Service: Orthopedics;  Laterality: Left;  . ARTERIOVENOUS GRAFT PLACEMENT    . ARTERIOVENOUS GRAFT PLACEMENT Left 10/22/2016   thigh  . AV FISTULA PLACEMENT  03/31/2012   Procedure: ARTERIOVENOUS (AV) FISTULA CREATION;  Surgeon: [name], MD;  Location: [facility] OR;  Service: Vascular;  Laterality: Right;  First stage Brachial vein transposition  . AV FISTULA PLACEMENT Right 08/25/2012   Procedure: INSERTION OF ARTERIOVENOUS (AV) GORE-TEX GRAFT ARM;  Surgeon: [name], MD;  Location: [facility] OR;  Service: Vascular;  Laterality: Right;  . AV FISTULA PLACEMENT Left 07/23/2016   Procedure: INSERTION OF ARTERIOVENOUS (AV) GORE-TEX GRAFT LEFT UPPER ARM;  Surgeon: [name], MD;  Location: [facility] OR;  Service: Vascular;  Laterality: Left;  . AV FISTULA PLACEMENT Left 10/22/2016   Procedure: INSERTION OF 4-61mm x 45cm  ARTERIOVENOUS (AV) GORE-TEX GRAFT THIGH-LEFT;  Surgeon: [name], MD;  Location: [facility] OR;  Service: Vascular;  Laterality: Left;  . CATARACT EXTRACTION W/ INTRAOCULAR LENS  IMPLANT, BILATERAL    . COLONOSCOPY N/A 03/25/2017   Dr. Darrick Penna: examined portion of ileum normal. Redundant left colon, stricture at anus   . ESOPHAGOGASTRODUODENOSCOPY N/A 03/25/2017   Dr. Darrick Penna: widely patent Schatzki ring at GE junction s/p dilation, atrophic gastritis, single duodenal AVM, non-bleeding  . INSERTION OF DIALYSIS CATHETER Left 05/05/2012   Procedure: INSERTION OF DIALYSIS CATHETER;  Surgeon: Chuck Hint, MD;  Location: [facility] OR;  Service: Vascular;  Laterality: Left;  . INSERTION OF DIALYSIS CATHETER Right 10/01/2016   Procedure: INSERTION OF DIALYSIS CATHETER- RIGHT FEMORAL;  Surgeon: [name], MD;  Location: [facility] OR;  Service: Vascular;  Laterality: Right;  . IR FLUORO GUIDE CV LINE RIGHT  06/10/2016  . IR GENERIC HISTORICAL  06/07/2016   IR US GUIDE VASC ACCESS RIGHT 06/07/2016 Oley Balm, MD [facility]-INTERV RAD  . IR THROMBECTOMY AV FISTULA W/THROMBOLYSIS/PTA INC/SHUNT/IMG RIGHT Right  06/07/2016  . IR US GUIDE VASC ACCESS RIGHT  06/10/2016  . LIGATION ARTERIOVENOUS GORTEX GRAFT Left 08/04/2016   Procedure: LIGATION ARTERIOVENOUS GORTEX GRAFT;  Surgeon: Larina Earthly, MD;  Location: [facility] OR;  Service: Vascular;  Laterality: Left;  . LIGATION ARTERIOVENOUS GORTEX GRAFT Right 11/26/2016   Procedure: LIGATION OF RIGHT UPPER ARM ARTERIOVENOUS GORTEX GRAFT;  Surgeon: [name], MD;  Location: [facility] OR;  Service: Vascular;  Laterality: Right;  . LIGATION OF ARTERIOVENOUS  FISTULA Right 08/25/2012   Procedure: LIGATION OF ARTERIOVENOUS  FISTULA;  Surgeon: [name], MD;  Location: [facility] OR;  Service: Vascular;  Laterality: Right;  Ligation of right brachial vein transposition  . PERIPHERAL VASCULAR ATHERECTOMY  06/25/2017   Procedure: PERIPHERAL VASCULAR ATHERECTOMY;  Surgeon: Maeola Harman, MD;  Location: [facility] INVASIVE CV LAB;  Service: Cardiovascular;;  Rt. PT  . REMOVAL OF A DIALYSIS CATHETER Right 05/05/2012   Procedure: REMOVAL OF A DIALYSIS CATHETER;  Surgeon: Chuck Hint, MD;  Location: [facility] OR;  Service: Vascular;  Laterality: Right;  . REMOVAL OF A DIALYSIS CATHETER Right 10/01/2016   Procedure: REMOVAL OF A DIALYSIS CATHETER-RIGHT UPPER CHEST;  Surgeon: [name], MD;  Location: [facility] OR;  Service: Vascular;  Laterality: Right;  . REMOVAL OF A DIALYSIS CATHETER Right 11/26/2016   Procedure: REMOVAL OF RIGHT FEMORAL TUNNEL DIALYSIS CATHETER;  Surgeon: [name], MD;  Location: [facility] OR;  Service: Vascular;  Laterality: Right;  . SPHINCTEROTOMY N/A 05/15/2017   Procedure: POSSIBLE SPHINCTEROTOMY (BOTOX);  Surgeon: Romie Levee, MD;  Location: [facility];  Service: General;  Laterality: N/A;  . TOE AMPUTATION  02/2007   left foot first 3 toes  . UPPER EXTREMITY VENOGRAPHY Bilateral 07/18/2016   Procedure: Bilateral Upper Extremity Venography;  Surgeon: [name], MD;  Location: [facility] INVASIVE CV LAB;  Service: Cardiovascular;  Laterality: Bilateral;     Family History  Problem Relation Age of Onset  . Diabetes Mother   . Hypertension Mother   . AAA (abdominal aortic aneurysm) Mother   . Diabetes Father   . Hypertension Father    Social History:  reports that he has never smoked. He has never used smokeless tobacco. He reports that he does not drink alcohol or use drugs.  Allergies:  Allergies  Allergen Reactions  . Lisinopril Other (See Comments)    HYPERKALEMIA RENAL DYSFUNCTION PROGRESSION     Medications Prior to Admission  Medication Sig Dispense Refill  . acetaminophen (TYLENOL) 325 MG tablet Take 325 mg by mouth 4 (four) times daily as needed for mild pain or fever.    Marland Kitchen amitriptyline (ELAVIL) 25 MG tablet Take 25 mg by mouth daily at 8 pm.     . aspirin EC 81 MG tablet Take 81 mg by mouth daily.    Marland Kitchen dicyclomine (BENTYL) 10 MG capsule 1 PO 30 MINUTES PRIOR TO BREAKFAST AND LUNCH (Patient taking differently: Take 10 mg by mouth as needed (diarrhea). ) 62 capsule 11  . HYDROcodone-acetaminophen (NORCO/VICODIN) 5-325 MG tablet Take  1 tablet by mouth every 4 (four) hours as needed for moderate pain. (Patient taking differently: Take 1 tablet by mouth every 6 (six) hours as needed for moderate pain. ) 40 tablet 0  . insulin aspart (NOVOLOG) 100 UNIT/ML injection Inject into the skin 3 (three) times daily before meals. Sliding scale    . insulin lispro (HUMALOG) 100 UNIT/ML injection Inject 0-8 Units into the skin 3 (three) times daily before meals. Blood sugar of 70-100=0 units 101-250=1 unit 151-200=2 units 201-250=4 units  251-300=6 units 301-350= 8 units Greater than 350= 10 units **Call MD if glucose greater than 450**    . iron polysaccharides (NIFEREX) 150 MG capsule Take 1 capsule (150 mg total) by mouth 2 (two) times daily.    . isosorbide mononitrate (IMDUR) 30 MG 24 hr tablet Take 30 mg by mouth daily.    Marland Kitchen losartan (COZAAR) 50 MG tablet Take 50 mg by mouth daily.     . multivitamin (RENA-VIT) TABS tablet Take  1 tablet by mouth daily.  0  . pantoprazole (PROTONIX) 40 MG tablet Take 1 tablet (40 mg total) by mouth 2 (two) times daily with a meal. (Patient taking differently: Take 40 mg by mouth daily as needed (gerd). )    . polyethylene glycol (MIRALAX / GLYCOLAX) packet Take 17 g by mouth as needed for moderate constipation.    . senna (SENOKOT) 8.6 MG tablet Take 2 tablets by mouth daily as needed for constipation.    . sevelamer carbonate (RENVELA) 800 MG tablet Take 2 tablets (1,600 mg total) by mouth 3 (three) times daily with meals. (Patient taking differently: Take 1,600 mg by mouth 3 (three) times daily with meals. (0800, 1200, 1700))    . dextrose (GLUTOSE) 40 % GEL Take 1 Tube by mouth once as needed for low blood sugar.    . ipratropium (ATROVENT) 0.02 % nebulizer solution Take 0.4 mg by nebulization every 6 (six) hours as needed (congestion).      Results for orders placed or performed during the hospital encounter of 02/21/18 (from the past 48 hour(s))  I-STAT 4, (NA,K, GLUC, HGB,HCT)     Status: Abnormal   Collection Time: 02/21/18  6:44 AM  Result Value Ref Range   Sodium 136 135 - 145 mmol/L   Potassium 3.6 3.5 - 5.1 mmol/L   Glucose, Bld 198 (H) 70 - 99 mg/dL   HCT 18.5 (L) 63.1 - 49.7 %   Hemoglobin 7.5 (L) 13.0 - 17.0 g/dL   No results found.  Review of Systems  All other systems reviewed and are negative.   Blood pressure (!) 144/50, pulse 81, temperature 98.4 F (36.9 C), temperature source Oral, resp. rate 18, height 5\' 7"  (1.702 m), weight 49.4 kg, SpO2 98 %. Physical Exam  Constitutional: He is oriented to person, place, and time. He appears well-developed and well-nourished.  HENT:  Head: Normocephalic and atraumatic.  Neck: Normal range of motion.  Cardiovascular: Normal rate.  Respiratory: Effort normal.  Musculoskeletal:     Right hand: He exhibits tenderness, deformity and swelling.     Comments: Right long finger chronic ischemic disease with drainage   Neurological: He is alert and oriented to person, place, and time.  Skin: Skin is warm. There is erythema.  Psychiatric: He has a normal mood and affect. His behavior is normal. Judgment and thought content normal.     Assessment/Plan 81 year old male with chronic ischemia to right hand with draining wound from right long finger.  Have  discussed with the patient and his family the need for revision amputation to the metacarpophalangeal joint level.  They understand the risks and benefits and wished to proceed.  Marlowe Shores, MD 02/21/2018, 7:16 AM

## 2018-02-22 ENCOUNTER — Encounter (HOSPITAL_COMMUNITY): Payer: Self-pay | Admitting: Orthopedic Surgery

## 2018-02-22 DIAGNOSIS — N186 End stage renal disease: Secondary | ICD-10-CM

## 2018-02-22 DIAGNOSIS — S88111A Complete traumatic amputation at level between knee and ankle, right lower leg, initial encounter: Secondary | ICD-10-CM

## 2018-02-22 LAB — CBC
HCT: 25.4 % — ABNORMAL LOW (ref 39.0–52.0)
Hemoglobin: 7.5 g/dL — ABNORMAL LOW (ref 13.0–17.0)
MCH: 27.7 pg (ref 26.0–34.0)
MCHC: 29.5 g/dL — AB (ref 30.0–36.0)
MCV: 93.7 fL (ref 80.0–100.0)
Platelets: 200 10*3/uL (ref 150–400)
RBC: 2.71 MIL/uL — ABNORMAL LOW (ref 4.22–5.81)
RDW: 17 % — ABNORMAL HIGH (ref 11.5–15.5)
WBC: 10.6 10*3/uL — ABNORMAL HIGH (ref 4.0–10.5)
nRBC: 0 % (ref 0.0–0.2)

## 2018-02-22 LAB — BASIC METABOLIC PANEL
Anion gap: 16 — ABNORMAL HIGH (ref 5–15)
BUN: 63 mg/dL — AB (ref 8–23)
CO2: 23 mmol/L (ref 22–32)
Calcium: 7.2 mg/dL — ABNORMAL LOW (ref 8.9–10.3)
Chloride: 97 mmol/L — ABNORMAL LOW (ref 98–111)
Creatinine, Ser: 5.17 mg/dL — ABNORMAL HIGH (ref 0.61–1.24)
GFR calc Af Amer: 11 mL/min — ABNORMAL LOW (ref >60)
GFR calc non Af Amer: 10 mL/min — ABNORMAL LOW (ref >60)
Glucose, Bld: 91 mg/dL (ref 70–99)
Potassium: 4.4 mmol/L (ref 3.5–5.1)
Sodium: 136 mmol/L (ref 135–145)

## 2018-02-22 LAB — GLUCOSE, CAPILLARY
Glucose-Capillary: 134 mg/dL — ABNORMAL HIGH (ref 70–99)
Glucose-Capillary: 86 mg/dL (ref 70–99)
Glucose-Capillary: 109 mg/dL — ABNORMAL HIGH (ref 70–99)

## 2018-02-22 MED ORDER — PIPERACILLIN-TAZOBACTAM 3.375 G IVPB
3.3750 g | Freq: Two times a day (BID) | INTRAVENOUS | Status: DC
  Administered 2018-02-22 – 2018-02-23 (×3): 3.375 g via INTRAVENOUS
  Filled 2018-02-22 (×5): qty 50

## 2018-02-22 NOTE — Progress Notes (Signed)
Pharmacy Antibiotic Note  BARREN GOLDSON is a 82 y.o. male admitted on 02/21/2018 with wound infection/osteomeylitis of right long finger.  Pharmacy has been consulted for zosyn dosing. ESRD patient with HD-MWF. WBC 10.6. Afebrile  Plan: Zosyn 3.375g IV q12h (4 hour infusion).  Monitor Cx, renal function, and clinical status    Height: 5\' 7"  (170.2 cm) Weight: 109 lb (49.4 kg) IBW/kg (Calculated) : 66.1  Temp (24hrs), Avg:98.9 F (37.2 C), Min:98.7 F (37.1 C), Max:99 F (37.2 C)  Recent Labs  Lab 02/21/18 1229 02/22/18 0449  WBC 10.3 10.6*  CREATININE 3.83* 5.17*    Estimated Creatinine Clearance: 8 mL/min (A) (by C-G formula based on SCr of 5.17 mg/dL (H)).    Allergies  Allergen Reactions  . Lisinopril Other (See Comments)    HYPERKALEMIA RENAL DYSFUNCTION PROGRESSION     Antimicrobials this admission: Zosyn 12/15 >>    Dose adjustments this admission:   Microbiology results: 12/14 Wound:    Thank you for allowing pharmacy to be a part of this patient's care.  Ewing Schlein, PharmD PGY1 Pharmacy Resident 02/22/2018    3:04 PM Please check AMION for all [facility] numbers

## 2018-02-22 NOTE — Progress Notes (Signed)
PROGRESS NOTE    ARSEN MANGIONE  ZOX:[id number] DOB: [birth year] DOA: 02/21/2018 PCP: Toma Deiters, MD    Brief Narrative:  Charles Vasquez is a 82 y.o. male with medical history significant for ischemic disease involving extremities chronic gangrene of the left long finger, anemia, AV fistula malformation, myocardial infarction osteomyelitis type 2 diabetes mellitus insulin-dependent hypertension hyperlipidemia history of CVA end-stage renal disease on hemodialysis.  He underwent incision and drainage of the right long finger with metacarpophalangeal joint level amputation with fillet flap closure on 02/21/2018.  Final Intra-Op cultures still pending at this time.   Assessment & Plan:   Principal Problem:   Osteomyelitis of finger of right hand (HCC) Active Problems:   Diabetes (HCC)   Essential hypertension   GERD   HEMATURIA UNSPECIFIED   History of cardiovascular disorder   End stage renal disease (HCC)   ESRD (end stage renal disease) on dialysis (HCC)   Melena   CAD (coronary artery disease)   Type II diabetes mellitus with renal manifestations (HCC)   Amputation of right lower extremity below knee upon examination (HCC)   Anemia of chronic disease   Type 2 diabetes mellitus with peripheral neuropathy (HCC)   Left below-knee amputee (HCC)   Peripheral vascular disease due to secondary diabetes mellitus (HCC)   1.  Osteomyelitis of the right finger status post surgery. 02/22/18: -He underwent incision and drainage with metacarpal phalangeal joint level amputation with fillet flap closure.  Currently has dressing in place.  Preliminary cultures showing abundant WBC, gram-positive cocci in clusters, gram-negative rods, gram-positive rods.  Final culture still pending at this time.  We will start him on IV Zosyn.  Orthopedics is recommending to consult infectious disease.  Hopefully the cultures should result in a.m.  Consult infectious disease in AM.  2.  Type 2 diabetes  mellitus uncontrolled 02/22/18: -Continue Lantus, continue Accu-Cheks, insulin sliding scale and adjust dose accordingly.  3.  Anemia most likely of chronic disease we will monitor Hemoccult stool was  ordered.  Patient was said to have been transfused with blood on Monday. 02/22/18: -Hemoglobin today 7.5.  Ordered a.m. labs.  Continue to monitor and transfuse as needed.  4.  Bilateral below-knee amputation with ulcer 02/22/18: -Has dressing in place.  Will consult wound care.  5.  End-stage renal disease on hemodialysis Monday Wednesday and Friday 02/22/18: - Called nephrology but they wanted to be consulted in a.m. as his dialysis days are Monday/Wednesday/Friday.  He likely follows up with outside nephrologist.   DVT prophylaxis: Subcutaneous heparin Code Status: Full Family Communication: Discussed with family at bedside Disposition Plan: Home    Consultants:   Orthopedics  Procedures:  02/21/18: Incision and drainage above with metacarpophalangeal joint level amputation with fillet flap closure.  Antimicrobials:   Ancef (02/21/2018)  Zosyn (02/22/2018)   Subjective: Complaining of some pain in the hand.  Oriented x2.  Objective: Vitals:   02/21/18 1144 02/21/18 1951 02/22/18 0439 02/22/18 1245  BP: (!) 152/65 (!) 114/49 (!) 136/56 (!) 126/53  Pulse: 73 81 82 77  Resp: 18 14 13 11   Temp: (!) 97.4 F (36.3 C) 99 F (37.2 C) 99 F (37.2 C) 98.7 F (37.1 C)  TempSrc: Oral Oral Oral Oral  SpO2: 100% 100% 100% 100%  Weight:      Height:        Intake/Output Summary (Last 24 hours) at 02/22/2018 1427 Last data filed at 02/21/2018 1700 Gross per 24 hour  Intake 240 ml  Output -  Net 240 ml   Filed Weights   02/21/18 0640  Weight: 49.4 kg    Examination:  General exam: Chronically ill-appearing male.  Complaining of some pain in the hand but otherwise appears calm and comfortable  Respiratory system: Clear to auscultation. Respiratory effort  normal. Cardiovascular system: S1 & S2 heard, RRR. No JVD, murmurs, rubs, gallops or clicks. No pedal edema. Gastrointestinal system: Abdomen is nondistended, soft and nontender. No organomegaly or masses felt. Normal bowel sounds heard. Central nervous system: Oriented x2, No focal neurological deficits. Extremities: Right hand with dressing in place, bilateral lower extremity amputation, thigh fistula Skin: Dry, no rashes Psychiatry: Judgement and insight appear normal. Mood & affect appropriate.     Data Reviewed: I have personally reviewed following labs and imaging studies  CBC: Recent Labs  Lab 02/21/18 0644 02/21/18 1229 02/22/18 0449  WBC  --  10.3 10.6*  HGB 7.5* 9.1* 7.5*  HCT 22.0* 30.1* 25.4*  MCV  --  93.5 93.7  PLT  --  179 200   Basic Metabolic Panel: Recent Labs  Lab 02/21/18 0644 02/21/18 1229 02/22/18 0449  NA 136  --  136  K 3.6  --  4.4  CL  --   --  97*  CO2  --   --  23  GLUCOSE 198*  --  91  BUN  --   --  63*  CREATININE  --  3.83* 5.17*  CALCIUM  --   --  7.2*   GFR: Estimated Creatinine Clearance: 8 mL/min (A) (by C-G formula based on SCr of 5.17 mg/dL (H)). Liver Function Tests: No results for input(s): AST, ALT, ALKPHOS, BILITOT, PROT, ALBUMIN in the last 168 hours. No results for input(s): LIPASE, AMYLASE in the last 168 hours. No results for input(s): AMMONIA in the last 168 hours. Coagulation Profile: No results for input(s): INR, PROTIME in the last 168 hours. Cardiac Enzymes: No results for input(s): CKTOTAL, CKMB, CKMBINDEX, TROPONINI in the last 168 hours. BNP (last 3 results) No results for input(s): PROBNP in the last 8760 hours. HbA1C: No results for input(s): HGBA1C in the last 72 hours. CBG: Recent Labs  Lab 02/21/18 1559 02/21/18 2112 02/21/18 2325 02/22/18 0551 02/22/18 1244  GLUCAP 202* 53* 119* 86 109*   Lipid Profile: No results for input(s): CHOL, HDL, LDLCALC, TRIG, CHOLHDL, LDLDIRECT in the last 72  hours. Thyroid Function Tests: No results for input(s): TSH, T4TOTAL, FREET4, T3FREE, THYROIDAB in the last 72 hours. Anemia Panel: No results for input(s): VITAMINB12, FOLATE, FERRITIN, TIBC, IRON, RETICCTPCT in the last 72 hours. Sepsis Labs: No results for input(s): PROCALCITON, LATICACIDVEN in the last 168 hours.  Recent Results (from the past 240 hour(s))  Aerobic/Anaerobic Culture (surgical/deep wound)     Status: None (Preliminary result)   Collection Time: 02/21/18  8:18 AM  Result Value Ref Range Status   Specimen Description WOUND RIGHT FINGER LONG  Final   Special Requests PATIENT ON FOLLOWING ANCEF  Final   Gram Stain   Final    ABUNDANT WBC PRESENT, PREDOMINANTLY PMN MODERATE GRAM POSITIVE COCCI IN CLUSTERS FEW GRAM NEGATIVE RODS FEW GRAM POSITIVE RODS Performed at [facility], [address], [place], [place] [address]    Culture ABUNDANT PROTEUS MIRABILIS  Final   Report Status PENDING  Incomplete         Radiology Studies: No results found.      Scheduled Meds: . amitriptyline  25 mg Oral Q2000  .  heparin  5,000 Units Subcutaneous Q8H  . insulin aspart  0-9 Units Subcutaneous TID WC  . insulin glargine  5 Units Subcutaneous QHS  . iron polysaccharides  150 mg Oral BID  . isosorbide mononitrate  30 mg Oral Daily  . losartan  50 mg Oral Daily  . multivitamin  1 tablet Oral Daily  . pantoprazole  40 mg Oral Daily  . sevelamer carbonate  1,600 mg Oral TID WC   Continuous Infusions:   LOS: 0 days    Time spent: 35 min    Vonzella Nipple, MD [facility] Pager on amion  If 7PM-7AM, please contact night-coverage [email or web address] Password [email or web address] 02/22/2018, 2:27 PM

## 2018-02-22 NOTE — Progress Notes (Signed)
Patient seen at bedside this morning eating breakfast..  Patient with minimal pain complaints and stable to exam.  Would recommend infectious disease consult for antibiotic coverage due to Gram stain showing both gram-negative and gram-positive organisms.  We will need to see patient in my office this Tuesday for dressing change.

## 2018-02-22 NOTE — Plan of Care (Signed)

## 2018-02-22 NOTE — Plan of Care (Signed)
  Problem: Education: Goal: Knowledge of General Education information will improve Description: Including pain rating scale, medication(s)/side effects and non-pharmacologic comfort measures Outcome: Progressing   Problem: Health Behavior/Discharge Planning: Goal: Ability to manage health-related needs will improve Outcome: Progressing   Problem: Clinical Measurements: Goal: Ability to maintain clinical measurements within normal limits will improve Outcome: Progressing Goal: Diagnostic test results will improve Outcome: Progressing Goal: Respiratory complications will improve Outcome: Progressing   Problem: Nutrition: Goal: Adequate nutrition will be maintained Outcome: Progressing   Problem: Pain Managment: Goal: General experience of comfort will improve Outcome: Progressing   Problem: Safety: Goal: Ability to remain free from injury will improve Outcome: Progressing   Problem: Skin Integrity: Goal: Risk for impaired skin integrity will decrease Outcome: Progressing   

## 2018-02-23 ENCOUNTER — Telehealth: Payer: Self-pay | Admitting: Gastroenterology

## 2018-02-23 ENCOUNTER — Telehealth: Payer: Self-pay

## 2018-02-23 DIAGNOSIS — E785 Hyperlipidemia, unspecified: Secondary | ICD-10-CM | POA: Diagnosis present

## 2018-02-23 DIAGNOSIS — Z89511 Acquired absence of right leg below knee: Secondary | ICD-10-CM | POA: Diagnosis not present

## 2018-02-23 DIAGNOSIS — M2550 Pain in unspecified joint: Secondary | ICD-10-CM | POA: Diagnosis not present

## 2018-02-23 DIAGNOSIS — D62 Acute posthemorrhagic anemia: Secondary | ICD-10-CM | POA: Diagnosis not present

## 2018-02-23 DIAGNOSIS — E1142 Type 2 diabetes mellitus with diabetic polyneuropathy: Secondary | ICD-10-CM | POA: Diagnosis present

## 2018-02-23 DIAGNOSIS — M869 Osteomyelitis, unspecified: Secondary | ICD-10-CM | POA: Diagnosis not present

## 2018-02-23 DIAGNOSIS — N2581 Secondary hyperparathyroidism of renal origin: Secondary | ICD-10-CM | POA: Diagnosis not present

## 2018-02-23 DIAGNOSIS — K219 Gastro-esophageal reflux disease without esophagitis: Secondary | ICD-10-CM | POA: Diagnosis present

## 2018-02-23 DIAGNOSIS — E1169 Type 2 diabetes mellitus with other specified complication: Secondary | ICD-10-CM | POA: Diagnosis not present

## 2018-02-23 DIAGNOSIS — M79644 Pain in right finger(s): Secondary | ICD-10-CM | POA: Diagnosis present

## 2018-02-23 DIAGNOSIS — R5381 Other malaise: Secondary | ICD-10-CM | POA: Diagnosis not present

## 2018-02-23 DIAGNOSIS — M86641 Other chronic osteomyelitis, right hand: Secondary | ICD-10-CM | POA: Diagnosis not present

## 2018-02-23 DIAGNOSIS — Z89512 Acquired absence of left leg below knee: Secondary | ICD-10-CM | POA: Diagnosis not present

## 2018-02-23 DIAGNOSIS — Z833 Family history of diabetes mellitus: Secondary | ICD-10-CM | POA: Diagnosis not present

## 2018-02-23 DIAGNOSIS — Z7401 Bed confinement status: Secondary | ICD-10-CM | POA: Diagnosis not present

## 2018-02-23 DIAGNOSIS — Z7982 Long term (current) use of aspirin: Secondary | ICD-10-CM | POA: Diagnosis not present

## 2018-02-23 DIAGNOSIS — D638 Anemia in other chronic diseases classified elsewhere: Secondary | ICD-10-CM

## 2018-02-23 DIAGNOSIS — B964 Proteus (mirabilis) (morganii) as the cause of diseases classified elsewhere: Secondary | ICD-10-CM | POA: Diagnosis present

## 2018-02-23 DIAGNOSIS — E1152 Type 2 diabetes mellitus with diabetic peripheral angiopathy with gangrene: Secondary | ICD-10-CM | POA: Diagnosis present

## 2018-02-23 DIAGNOSIS — Z992 Dependence on renal dialysis: Secondary | ICD-10-CM | POA: Diagnosis not present

## 2018-02-23 DIAGNOSIS — Z8673 Personal history of transient ischemic attack (TIA), and cerebral infarction without residual deficits: Secondary | ICD-10-CM | POA: Diagnosis not present

## 2018-02-23 DIAGNOSIS — I2510 Atherosclerotic heart disease of native coronary artery without angina pectoris: Secondary | ICD-10-CM | POA: Diagnosis present

## 2018-02-23 DIAGNOSIS — N186 End stage renal disease: Secondary | ICD-10-CM | POA: Diagnosis not present

## 2018-02-23 DIAGNOSIS — Z8249 Family history of ischemic heart disease and other diseases of the circulatory system: Secondary | ICD-10-CM | POA: Diagnosis not present

## 2018-02-23 DIAGNOSIS — Z794 Long term (current) use of insulin: Secondary | ICD-10-CM | POA: Diagnosis not present

## 2018-02-23 DIAGNOSIS — I120 Hypertensive chronic kidney disease with stage 5 chronic kidney disease or end stage renal disease: Secondary | ICD-10-CM | POA: Diagnosis not present

## 2018-02-23 DIAGNOSIS — I252 Old myocardial infarction: Secondary | ICD-10-CM | POA: Diagnosis not present

## 2018-02-23 DIAGNOSIS — E1122 Type 2 diabetes mellitus with diabetic chronic kidney disease: Secondary | ICD-10-CM | POA: Diagnosis not present

## 2018-02-23 DIAGNOSIS — Z79899 Other long term (current) drug therapy: Secondary | ICD-10-CM | POA: Diagnosis not present

## 2018-02-23 LAB — CBC
HCT: 24.5 % — ABNORMAL LOW (ref 39.0–52.0)
Hemoglobin: 7.3 g/dL — ABNORMAL LOW (ref 13.0–17.0)
MCH: 27.7 pg (ref 26.0–34.0)
MCHC: 29.8 g/dL — ABNORMAL LOW (ref 30.0–36.0)
MCV: 92.8 fL (ref 80.0–100.0)
Platelets: 174 10*3/uL (ref 150–400)
RBC: 2.64 MIL/uL — ABNORMAL LOW (ref 4.22–5.81)
RDW: 16.2 % — ABNORMAL HIGH (ref 11.5–15.5)
WBC: 10.4 10*3/uL (ref 4.0–10.5)
nRBC: 0 % (ref 0.0–0.2)

## 2018-02-23 LAB — GLUCOSE, CAPILLARY
GLUCOSE-CAPILLARY: 57 mg/dL — AB (ref 70–99)
Glucose-Capillary: 133 mg/dL — ABNORMAL HIGH (ref 70–99)
Glucose-Capillary: 165 mg/dL — ABNORMAL HIGH (ref 70–99)
Glucose-Capillary: 151 mg/dL — ABNORMAL HIGH (ref 70–99)
Glucose-Capillary: 105 mg/dL — ABNORMAL HIGH (ref 70–99)
Glucose-Capillary: 87 mg/dL (ref 70–99)
Glucose-Capillary: 104 mg/dL — ABNORMAL HIGH (ref 70–99)
Glucose-Capillary: 87 mg/dL (ref 70–99)

## 2018-02-23 LAB — BASIC METABOLIC PANEL
Anion gap: 17 — ABNORMAL HIGH (ref 5–15)
BUN: 74 mg/dL — ABNORMAL HIGH (ref 8–23)
CALCIUM: 7.5 mg/dL — AB (ref 8.9–10.3)
CO2: 22 mmol/L (ref 22–32)
Chloride: 97 mmol/L — ABNORMAL LOW (ref 98–111)
Creatinine, Ser: 6.55 mg/dL — ABNORMAL HIGH (ref 0.61–1.24)
GFR calc Af Amer: 8 mL/min — ABNORMAL LOW (ref >60)
GFR calc non Af Amer: 7 mL/min — ABNORMAL LOW (ref >60)
Glucose, Bld: 76 mg/dL (ref 70–99)
Potassium: 4.4 mmol/L (ref 3.5–5.1)
Sodium: 136 mmol/L (ref 135–145)

## 2018-02-23 MED ORDER — LIDOCAINE-PRILOCAINE 2.5-2.5 % EX CREA
1.0000 "application " | TOPICAL_CREAM | CUTANEOUS | Status: DC | PRN
  Filled 2018-02-23: qty 5

## 2018-02-23 MED ORDER — DARBEPOETIN ALFA 60 MCG/0.3ML IJ SOSY
60.0000 ug | PREFILLED_SYRINGE | INTRAMUSCULAR | Status: DC
  Administered 2018-02-23: 60 ug via INTRAVENOUS

## 2018-02-23 MED ORDER — SODIUM CHLORIDE 0.9 % IV SOLN: 100.0000 mL | INTRAVENOUS | Status: DC | PRN

## 2018-02-23 MED ORDER — CHLORHEXIDINE GLUCONATE CLOTH 2 % EX PADS: 6.0000 | MEDICATED_PAD | Freq: Every day | CUTANEOUS | Status: DC

## 2018-02-23 MED ORDER — DOXERCALCIFEROL 4 MCG/2ML IV SOLN
1.0000 ug | INTRAVENOUS | Status: DC
  Administered 2018-02-23: 1 ug via INTRAVENOUS

## 2018-02-23 MED ORDER — GERHARDT'S BUTT CREAM
TOPICAL_CREAM | Freq: Two times a day (BID) | CUTANEOUS | Status: DC
  Administered 2018-02-23 – 2018-02-24 (×3): via TOPICAL
  Filled 2018-02-23: qty 1

## 2018-02-23 MED ORDER — SODIUM CHLORIDE 0.9 % IV SOLN
1.0000 g | Freq: Two times a day (BID) | INTRAVENOUS | Status: DC
  Administered 2018-02-23 – 2018-02-24 (×2): 1 g via INTRAVENOUS
  Filled 2018-02-23 (×3): qty 1000

## 2018-02-23 MED ORDER — DAKINS (1/4 STRENGTH) 0.125 % EX SOLN
Freq: Every day | CUTANEOUS | Status: DC
  Administered 2018-02-23 – 2018-02-24 (×2)
  Filled 2018-02-23: qty 473

## 2018-02-23 MED ORDER — DEXTROSE 50 % IV SOLN
INTRAVENOUS | Status: AC
  Administered 2018-02-23: 25 mL
  Filled 2018-02-23: qty 50

## 2018-02-23 MED ORDER — PENTAFLUOROPROP-TETRAFLUOROETH EX AERO: 1.0000 "application " | INHALATION_SPRAY | CUTANEOUS | Status: DC | PRN

## 2018-02-23 MED ORDER — HEPARIN SODIUM (PORCINE) 1000 UNIT/ML DIALYSIS
20.0000 [IU]/kg | INTRAMUSCULAR | Status: DC | PRN
  Filled 2018-02-23: qty 1

## 2018-02-23 MED ORDER — DARBEPOETIN ALFA 60 MCG/0.3ML IJ SOSY
PREFILLED_SYRINGE | INTRAMUSCULAR | Status: AC
  Administered 2018-02-23: 60 ug via INTRAVENOUS
  Filled 2018-02-23: qty 0.3

## 2018-02-23 MED ORDER — DOXERCALCIFEROL 4 MCG/2ML IV SOLN
INTRAVENOUS | Status: AC
  Administered 2018-02-23: 1 ug via INTRAVENOUS
  Filled 2018-02-23: qty 2

## 2018-02-23 NOTE — Progress Notes (Signed)
CSW left voicemail with [facility] Rub[facility] to confirm patient is from there and can return when medically stable to discharge.   Awaiting call back.   [place], [place] [phone]

## 2018-02-23 NOTE — Telephone Encounter (Signed)
MB spoke with [facility] in [facility] and cancelled procedure

## 2018-02-23 NOTE — Progress Notes (Signed)
HD tx completed @ 2215 w/o problem UF goal met Blood rinsed back VSS Report called to Olegario Shearer, RN

## 2018-02-23 NOTE — Plan of Care (Signed)
  Problem: Education: Goal: Knowledge of General Education information will improve Description Including pain rating scale, medication(s)/side effects and non-pharmacologic comfort measures Outcome: Progressing   Problem: Clinical Measurements: Goal: Ability to maintain clinical measurements within normal limits will improve Outcome: Progressing   Problem: Clinical Measurements: Goal: Will remain free from infection Outcome: Progressing   Problem: Nutrition: Goal: Adequate nutrition will be maintained Outcome: Progressing   Problem: Pain Managment: Goal: General experience of comfort will improve Outcome: Progressing   Problem: Safety: Goal: Ability to remain free from injury will improve Outcome: Progressing   Problem: Skin Integrity: Goal: Risk for impaired skin integrity will decrease Outcome: Progressing

## 2018-02-23 NOTE — Consult Note (Addendum)
[facility] Renal Consultation Note    Indication for Consultation:  Management of ESRD/hemodialysis; anemia, hypertension/volume and secondary hyperparathyroidism  HPI: Charles Vasquez is a 82 y.o. male with ESRD on HD, HTN, HLD, DM, stroke, GERD, CAD, and PVD s/p bilateral BKA.   Admitted with osteomyelitis of R long finger. Underwent I&D with MCP joint amputation on Saturday.  Labs this am: Na 136, K 4.4, Cr 6.55 BUN 74, WBC 10.4 Hgb. 7.3.   Dialyzes MWF at [facility]. Last HD Friday. Has been compliant with HD. We are asked to see for routine dialysis today.   Seen and examined in room. He is alert and oriented x2. Pain controlled. Denies CP, SOB, N/V/D.   Past Medical History:  Diagnosis Date  . Anal pain   . Anemia   . AVF (arteriovenous fistula) (HCC) 05-13-2017 per pt currently AVF access used is left thigh   hx multiple AVF surgery's (previously left upper arm, bilateral thigh) last surgery -- right upper arm creation 11-26-2016  . Constipation   . Coronary artery disease   . ESRD (end stage renal disease) on dialysis [facility])    [facility] in [place], [place] on MWF   . Gangrene (HCC)    left leg  . History of CVA (cerebrovascular accident)    05-13-2017  per pt stated "thats what they told yrs ago"  per pt no residual  . Hyperlipidemia   . Hypertension   . Myocardial infarction (HCC)   . Osteoarthritis   . Stroke (HCC)   . Type 2 diabetes mellitus treated with insulin (HCC)    Type II   Past Surgical History:  Procedure Laterality Date  . ABDOMINAL AORTOGRAM W/LOWER EXTREMITY N/A 06/25/2017   Procedure: ABDOMINAL AORTOGRAM W/LOWER EXTREMITY;  Surgeon: Maeola Harman, MD;  Location: [facility] INVASIVE CV LAB;  Service: Cardiovascular;  Laterality: N/A;  . AMPUTATION Right 01/01/2017   Procedure: REVISION AMPUTATION RIGHT INDEX FINGER;  Surgeon: Dairl Ponder, MD;  Location: [facility] OR;  Service: Orthopedics;  Laterality: Right;  . AMPUTATION Right  07/15/2017   Procedure: AMPUTATION BELOW KNEE RIGHT;  Surgeon: [name], MD;  Location: [facility] OR;  Service: Vascular;  Laterality: Right;  . AMPUTATION Right 10/08/2017   Procedure: RIGHT INDEX, RIGHT LONG REVISION AMPUTATIONS;  Surgeon: Dairl Ponder, MD;  Location: [facility] OR;  Service: Orthopedics;  Laterality: Right;  . AMPUTATION Left 01/02/2018   Procedure: LEFT BELOW KNEE AMPUTATION;  Surgeon: Nadara Mustard, MD;  Location: [facility] OR;  Service: Orthopedics;  Laterality: Left;  . AMPUTATION Right 02/21/2018   Procedure: RIGHT LONG FINGER REVISION AMPUTATION;  Surgeon: Dairl Ponder, MD;  Location: [facility] OR;  Service: Orthopedics;  Laterality: Right;  . ARTERIOVENOUS GRAFT PLACEMENT    . ARTERIOVENOUS GRAFT PLACEMENT Left 10/22/2016   thigh  . AV FISTULA PLACEMENT  03/31/2012   Procedure: ARTERIOVENOUS (AV) FISTULA CREATION;  Surgeon: [name], MD;  Location: [facility] OR;  Service: Vascular;  Laterality: Right;  First stage Brachial vein transposition  . AV FISTULA PLACEMENT Right 08/25/2012   Procedure: INSERTION OF ARTERIOVENOUS (AV) GORE-TEX GRAFT ARM;  Surgeon: [name], MD;  Location: [facility] OR;  Service: Vascular;  Laterality: Right;  . AV FISTULA PLACEMENT Left 07/23/2016   Procedure: INSERTION OF ARTERIOVENOUS (AV) GORE-TEX GRAFT LEFT UPPER ARM;  Surgeon: [name], MD;  Location: [facility] OR;  Service: Vascular;  Laterality: Left;  . AV FISTULA PLACEMENT Left 10/22/2016   Procedure: INSERTION OF 4-66mm x 45cm  ARTERIOVENOUS (AV) GORE-TEX GRAFT THIGH-LEFT;  Surgeon: [name], MD;  Location: [facility] OR;  Service: Vascular;  Laterality: Left;  . CATARACT EXTRACTION W/ INTRAOCULAR LENS  IMPLANT, BILATERAL    . COLONOSCOPY N/A 03/25/2017   Dr. Darrick Penna: examined portion of ileum normal. Redundant left colon, stricture at anus   . ESOPHAGOGASTRODUODENOSCOPY N/A 03/25/2017   Dr. Darrick Penna: widely patent Schatzki ring at GE junction s/p dilation, atrophic gastritis, single duodenal AVM, non-bleeding  .  INSERTION OF DIALYSIS CATHETER Left 05/05/2012   Procedure: INSERTION OF DIALYSIS CATHETER;  Surgeon: Chuck Hint, MD;  Location: [facility] OR;  Service: Vascular;  Laterality: Left;  . INSERTION OF DIALYSIS CATHETER Right 10/01/2016   Procedure: INSERTION OF DIALYSIS CATHETER- RIGHT FEMORAL;  Surgeon: [name], MD;  Location: [facility] OR;  Service: Vascular;  Laterality: Right;  . IR FLUORO GUIDE CV LINE RIGHT  06/10/2016  . IR GENERIC HISTORICAL  06/07/2016   IR US GUIDE VASC ACCESS RIGHT 06/07/2016 Oley Balm, MD [facility]-INTERV RAD  . IR THROMBECTOMY AV FISTULA W/THROMBOLYSIS/PTA INC/SHUNT/IMG RIGHT Right 06/07/2016  . IR US GUIDE VASC ACCESS RIGHT  06/10/2016  . LIGATION ARTERIOVENOUS GORTEX GRAFT Left 08/04/2016   Procedure: LIGATION ARTERIOVENOUS GORTEX GRAFT;  Surgeon: Larina Earthly, MD;  Location: [facility] OR;  Service: Vascular;  Laterality: Left;  . LIGATION ARTERIOVENOUS GORTEX GRAFT Right 11/26/2016   Procedure: LIGATION OF RIGHT UPPER ARM ARTERIOVENOUS GORTEX GRAFT;  Surgeon: [name], MD;  Location: [facility] OR;  Service: Vascular;  Laterality: Right;  . LIGATION OF ARTERIOVENOUS  FISTULA Right 08/25/2012   Procedure: LIGATION OF ARTERIOVENOUS  FISTULA;  Surgeon: [name], MD;  Location: [facility] OR;  Service: Vascular;  Laterality: Right;  Ligation of right brachial vein transposition  . PERIPHERAL VASCULAR ATHERECTOMY  06/25/2017   Procedure: PERIPHERAL VASCULAR ATHERECTOMY;  Surgeon: Maeola Harman, MD;  Location: [facility] INVASIVE CV LAB;  Service: Cardiovascular;;  Rt. PT  . REMOVAL OF A DIALYSIS CATHETER Right 05/05/2012   Procedure: REMOVAL OF A DIALYSIS CATHETER;  Surgeon: Chuck Hint, MD;  Location: [facility] OR;  Service: Vascular;  Laterality: Right;  . REMOVAL OF A DIALYSIS CATHETER Right 10/01/2016   Procedure: REMOVAL OF A DIALYSIS CATHETER-RIGHT UPPER CHEST;  Surgeon: [name], MD;  Location: [facility] OR;  Service: Vascular;  Laterality: Right;  . REMOVAL OF A DIALYSIS CATHETER  Right 11/26/2016   Procedure: REMOVAL OF RIGHT FEMORAL TUNNEL DIALYSIS CATHETER;  Surgeon: [name], MD;  Location: [facility] OR;  Service: Vascular;  Laterality: Right;  . SPHINCTEROTOMY N/A 05/15/2017   Procedure: POSSIBLE SPHINCTEROTOMY (BOTOX);  Surgeon: Romie Levee, MD;  Location: [facility];  Service: General;  Laterality: N/A;  . TOE AMPUTATION  02/2007   left foot first 3 toes  . UPPER EXTREMITY VENOGRAPHY Bilateral 07/18/2016   Procedure: Bilateral Upper Extremity Venography;  Surgeon: [name], MD;  Location: [facility] INVASIVE CV LAB;  Service: Cardiovascular;  Laterality: Bilateral;   Family History  Problem Relation Age of Onset  . Diabetes Mother   . Hypertension Mother   . AAA (abdominal aortic aneurysm) Mother   . Diabetes Father   . Hypertension Father    Social History:  reports that he has never smoked. He has never used smokeless tobacco. He reports that he does not drink alcohol or use drugs. Allergies  Allergen Reactions  . Lisinopril Other (See Comments)    HYPERKALEMIA RENAL DYSFUNCTION PROGRESSION    Prior to Admission medications  Medication Sig Start Date End Date Taking? Authorizing Provider  acetaminophen (TYLENOL) 325 MG tablet Take 325 mg by mouth 4 (four) times daily as needed for mild pain or fever.   Yes [provider]  amitriptyline (ELAVIL) 25 MG tablet Take 25 mg by mouth daily at 8 pm.    Yes [provider]  aspirin EC 81 MG tablet Take 81 mg by mouth daily.   Yes [provider]  dicyclomine (BENTYL) 10 MG capsule 1 PO 30 MINUTES PRIOR TO BREAKFAST AND LUNCH Patient taking differently: Take 10 mg by mouth as needed (diarrhea).  03/25/17  Yes Fields, Darleene Cleaver, MD  HYDROcodone-acetaminophen (NORCO/VICODIN) 5-325 MG tablet Take 1 tablet by mouth every 4 (four) hours as needed for moderate pain. Patient taking differently: Take 1 tablet by mouth every 6 (six) hours as needed for moderate pain.  01/06/18  Yes  Nadara Mustard, MD  insulin aspart (NOVOLOG) 100 UNIT/ML injection Inject into the skin 3 (three) times daily before meals. Sliding scale   Yes [provider]  insulin lispro (HUMALOG) 100 UNIT/ML injection Inject 0-8 Units into the skin 3 (three) times daily before meals. Blood sugar of 70-100=0 units 101-250=1 unit 151-200=2 units 201-250=4 units  251-300=6 units 301-350= 8 units Greater than 350= 10 units **Call MD if glucose greater than 450**   Yes [provider]  iron polysaccharides (NIFEREX) 150 MG capsule Take 1 capsule (150 mg total) by mouth 2 (two) times daily. 07/30/17  Yes Angiulli, Mcarthur Rossetti, PA-C  isosorbide mononitrate (IMDUR) 30 MG 24 hr tablet Take 30 mg by mouth daily.   Yes [provider]  losartan (COZAAR) 50 MG tablet Take 50 mg by mouth daily.    Yes [provider]  multivitamin (RENA-VIT) TABS tablet Take 1 tablet by mouth daily. 07/30/17  Yes Angiulli, Mcarthur Rossetti, PA-C  pantoprazole (PROTONIX) 40 MG tablet Take 1 tablet (40 mg total) by mouth 2 (two) times daily with a meal. Patient taking differently: Take 40 mg by mouth daily as needed (gerd).  07/30/17  Yes Angiulli, Mcarthur Rossetti, PA-C  polyethylene glycol (MIRALAX / GLYCOLAX) packet Take 17 g by mouth as needed for moderate constipation.   Yes [provider]  senna (SENOKOT) 8.6 MG tablet Take 2 tablets by mouth daily as needed for constipation.   Yes [provider]  sevelamer carbonate (RENVELA) 800 MG tablet Take 2 tablets (1,600 mg total) by mouth 3 (three) times daily with meals. Patient taking differently: Take 1,600 mg by mouth 3 (three) times daily with meals. (0800, 1200, 1700) 07/30/17  Yes Angiulli, Mcarthur Rossetti, PA-C  dextrose (GLUTOSE) 40 % GEL Take 1 Tube by mouth once as needed for low blood sugar.    [provider]  ipratropium (ATROVENT) 0.02 % nebulizer solution Take 0.4 mg by nebulization every 6 (six) hours as needed (congestion).    [provider]   Current Facility-Administered Medications  Medication Dose Route Frequency Provider Last Rate Last Dose  . acetaminophen (TYLENOL) tablet 325 mg  325 mg Oral QID PRN Myrtie Neither, MD   325 mg at 02/23/18 0854  . amitriptyline (ELAVIL) tablet 25 mg  25 mg Oral Q2000 Myrtie Neither, MD   25 mg at 02/22/18 2131  . [START ON 02/24/2018] Chlorhexidine Gluconate Cloth 2 % PADS 6 each  6 each Topical Q0600 Tomasa Blase, PA-C      . dicyclomine (BENTYL) capsule 10 mg  10 mg Oral PRN Barrie Folk,  Bernadene Person, MD      . Gerhardt's butt cream   Topical BID Hollice Espy, MD      . heparin injection 5,000 Units  5,000 Units Subcutaneous Q8H Myrtie Neither, MD   5,000 Units at 02/23/18 1350  . HYDROcodone-acetaminophen (NORCO/VICODIN) 5-325 MG per tablet 1 tablet  1 tablet Oral Q6H PRN Myrtie Neither, MD   1 tablet at 02/22/18 0843  . insulin aspart (novoLOG) injection 0-9 Units  0-9 Units Subcutaneous TID WC Myrtie Neither, MD   1 Units at 02/22/18 1732  . insulin glargine (LANTUS) injection 5 Units  5 Units Subcutaneous QHS Myrtie Neither, MD   5 Units at 02/22/18 2136  . ipratropium (ATROVENT) nebulizer solution 0.4 mg  0.4 mg Nebulization Q6H PRN Myrtie Neither, MD      . iron polysaccharides (NIFEREX) capsule 150 mg  150 mg Oral BID Myrtie Neither, MD   150 mg at 02/23/18 0846  . isosorbide mononitrate (IMDUR) 24 hr tablet 30 mg  30 mg Oral Daily Myrtie Neither, MD   30 mg at 02/23/18 0848  . losartan (COZAAR) tablet 50 mg  50 mg Oral Daily Myrtie Neither, MD   50 mg at 02/23/18 0848  . multivitamin (RENA-VIT) tablet 1 tablet  1 tablet Oral Daily Myrtie Neither, MD   1 tablet at 02/23/18 0846  . pantoprazole (PROTONIX) EC tablet 40 mg  40 mg Oral Daily Myrtie Neither, MD   40 mg at 02/23/18 0848  . piperacillin-tazobactam (ZOSYN) IVPB 3.375 g  3.375 g Intravenous Q12H Ewing Schlein Z, RPH 12.5 mL/hr at 02/23/18 0953 3.375 g at 02/23/18 0953  . senna  (SENOKOT) tablet 17.2 mg  2 tablet Oral Daily PRN Myrtie Neither, MD      . sevelamer carbonate (RENVELA) tablet 1,600 mg  1,600 mg Oral TID WC Myrtie Neither, MD   1,600 mg at 02/23/18 1222  . sodium hypochlorite (DAKIN'S 1/4 STRENGTH) topical solution   Irrigation Q1200 Hollice Espy, MD       Facility-Administered Medications Ordered in Other Encounters  Medication Dose Route Frequency Provider Last Rate Last Dose  . 0.9 %  sodium chloride infusion (Manually program via Guardrails IV Fluids)   Intravenous Once Rayburn, Fanny Bien, PA-C        ROS: As per HPI otherwise negative.  Physical Exam: Vitals:   02/22/18 1946 02/23/18 0448 02/23/18 0800 02/23/18 1407  BP: (!) 140/57 (!) 146/61 (!) 149/64 (!) 145/60  Pulse: 79 79 80 78  Resp: 14 12  16   Temp: 98.3 F (36.8 C) 98.9 F (37.2 C)  98.3 F (36.8 C)  TempSrc: Oral Oral  Oral  SpO2: 100% 96%  100%  Weight:      Height:         General: Thin elderly male NAD  Head: NCAT sclera not icteric MMM Neck: Supple. No JVD  Lungs: CTA bilaterally without wheezes, rales, or rhonchi. Breathing is unlabored. Heart: RRR with S1 S2 Abdomen: soft NT + BS Lower extremities: bilateral BKA L stump wrapped. No edema R stump  Neuro: A & O  X 3. Moves all extremities spontaneously. Psych:  Responds to questions appropriately with a normal affect. Dialysis Access: L thigh AVG +bruit   Labs: Basic Metabolic Panel: Recent Labs  Lab 02/21/18 0644 02/21/18 1229 02/22/18 0449 02/23/18 0337  NA 136  --  136 136  K 3.6  --  4.4 4.4  CL  --   --  97* 97*  CO2  --   --  23 22  GLUCOSE 198*  --  91 76  BUN  --   --  63* 74*  CREATININE  --  3.83* 5.17* 6.55*  CALCIUM  --   --  7.2* 7.5*   Liver Function Tests: No results for input(s): AST, ALT, ALKPHOS, BILITOT, PROT, ALBUMIN in the last 168 hours. No results for input(s): LIPASE, AMYLASE in the last 168 hours. No results for input(s): AMMONIA in the last 168  hours. CBC: Recent Labs  Lab 02/21/18 1229 02/22/18 0449 02/23/18 0337  WBC 10.3 10.6* 10.4  HGB 9.1* 7.5* 7.3*  HCT 30.1* 25.4* 24.5*  MCV 93.5 93.7 92.8  PLT 179 200 174   Cardiac Enzymes: No results for input(s): CKTOTAL, CKMB, CKMBINDEX, TROPONINI in the last 168 hours. CBG: Recent Labs  Lab 02/22/18 1945 02/22/18 2106 02/23/18 0613 02/23/18 0636 02/23/18 1157  GLUCAP 165* 151* 57* 105* 87   Iron Studies: No results for input(s): IRON, TIBC, TRANSFERRIN, FERRITIN in the last 72 hours. Studies/Results: No results found.  Dialysis Orders:  MWF  - [name]  3.25hrs, EDW 51kg, 2K/ 2.5Ca Access: L thigh AVG  Heparin 2000 load and 500 qhr EPO 14000 qHD  Hectorol IV qHD  Assessment/Plan: 1.  R finger gangrene s/p I&D of right 3rd finger with MCP joint amputation on 02/21/18. Wound cultures +Proteus mirabilis to date.  On Zosyn - per primary/ortho.  2.  ESRD -  MWF. Plan HD tonight on schedule. Family prefers to have HD tonight as he has an appointment tomorrow.  3.  Hypertension/volume  - BP ok. No volume on exam. UF 0.5-1L as tolerated.  4.  Anemia  - Hgb 7.3. Follow trend. Will give Aranesp 60 with HD today. Follow trends. Looks like he is scheduled for outpatient GI workup.  5.  Metabolic bone disease -  Continue VDRA/binder 6. DM- insulin per primary   Tomasa Blase PA-C [facility] Pager [phone] 02/23/2018, 3:49 PM   Pt seen, examined and agree w A/P as above.  Vinson Moselle MD [employer] pager [phone]   02/23/2018, 4:32 PM

## 2018-02-23 NOTE — Telephone Encounter (Signed)
Pts sister called and would like to schedule pts procedure. Pt will be discharged tomorrow. Routing message to MS, she is aware and pt will be scheduled once he's discharged.

## 2018-02-23 NOTE — Progress Notes (Signed)
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  Patient seen by orthopedic surgery and underwent MCP joint level amputation with fillet flap closure.  Postop on IV antibiotics with cultures pending.  Patient doing okay today.  No complaints.  Some pain at site of amputation, but moderately well controlled.  He normally gets dialysis on Monday/Wednesday/Friday and nephrology consulted with plan to take patient for hemodialysis this evening.  Cultures returned back positive for pansensitive Proteus mirabilis.  Assessment/Plan: Principal Problem:   Osteomyelitis of finger of right hand [facility]): Status post I&D with MCP joint level amputation with fillet flap closure.  Cultures back for Proteus mirabilis, pansensitive.  Currently on IV Zosyn.  Given osteomyelitis findings, will narrow IV Zosyn and continue IV antibiotics.  Upon discharge, can be discharged on p.o. antibiotics.  If patient remains stable, likely discharge tomorrow. Active Problems:  Essential hypertension: Stable, continue home medications   ESRD (end stage renal disease) on dialysis [facility]): Seen by nephrology with plans for dialysis this evening.    Type II diabetes mellitus with renal manifestations [facility]): Continue insulin sliding scale    Anemia of chronic disease: Hemoglobin at 7.5 today.  Some expected acute blood loss anemia after surgery.  Transfuse for hemoglobin below 7.    Peripheral vascular disease due to secondary diabetes mellitus (HCC)    Code Status: Full code  Family Communication: Family at the bedside  Disposition Plan: Potential discharge tomorrow if remains  stable   Consultants:  Nephrology  Orthopedic surgery  Procedures:  Status post MCP joint level amputation of long finger of right hand  Antimicrobials:  IV Zosyn 12/14-12/16  IV Cipro 12/16-present  DVT prophylaxis: Lovenox   Objective: Vitals:   02/23/18 0800 02/23/18 1407  BP: (!) 149/64 (!) 145/60  Pulse: 80 78  Resp:  16  Temp:  98.3 F (36.8 C)  SpO2:  100%    Intake/Output Summary (Last 24 hours) at 02/23/2018 1907 Last data filed at 02/23/2018 1700 Gross per 24 hour  Intake 460 ml  Output -  Net 460 ml   Filed Weights   02/21/18 0640  Weight: 49.4 kg   Body mass index is 17.07 kg/m.  Exam:   General: Alert and oriented x3, no acute distress  HEENT: Normocephalic and atraumatic, mucous membranes are slightly dry  Neck: Supple, no JVD  Cardiovascular: Regular rate and rhythm, S1-S2  Respiratory: Clear to auscultation bilaterally  Abdomen: Soft, nontender, nondistended, positive bowel sounds  Musculoskeletal: Status post right BKA.  Right hand bandaged at site of incision  Psychiatry: Appropriate, no evidence of psychoses   Data Reviewed: CBC: Recent Labs  Lab 02/21/18 0644 02/21/18 1229 02/22/18 0449 02/23/18 0337  WBC  --  10.3 10.6* 10.4  HGB 7.5* 9.1* 7.5* 7.3*  HCT 22.0* 30.1* 25.4* 24.5*  MCV  --  93.5 93.7 92.8  PLT  --  179 200 174   Basic Metabolic Panel: Recent Labs  Lab 02/21/18 0644 02/21/18 1229 02/22/18 0449 02/23/18 0337  NA 136  --  136 136  K 3.6  --  4.4 4.4  CL  --   --  97* 97*  CO2  --   --  23 22  GLUCOSE 198*  --  91 76  BUN  --   --  63* 74*  CREATININE  --  3.83* 5.17* 6.55*  CALCIUM  --   --  7.2* 7.5*   GFR: Estimated Creatinine Clearance: 6.3 mL/min (A) (by C-G formula based on SCr of 6.55 mg/dL (H)). Liver Function Tests: No results for input(s): AST, ALT, ALKPHOS, BILITOT, PROT, ALBUMIN in the last 168 hours. No results for input(s): LIPASE, AMYLASE in the last 168 hours. No  results for input(s): AMMONIA in the last 168 hours. Coagulation Profile: No results for input(s): INR, PROTIME in the last 168 hours. Cardiac Enzymes: No results for input(s): CKTOTAL, CKMB, CKMBINDEX, TROPONINI in the last 168 hours. BNP (last 3 results) No results for input(s): PROBNP in the last 8760 hours. HbA1C: No results for input(s): HGBA1C in the last 72 hours. CBG: Recent Labs  Lab 02/22/18 2106 02/23/18 0613 02/23/18 0636 02/23/18 1157 02/23/18 1614  GLUCAP 151* 57* 105* 87 104*   Lipid Profile: No results for input(s): CHOL, HDL, LDLCALC, TRIG, CHOLHDL, LDLDIRECT in the last 72 hours. Thyroid Function Tests: No results for input(s): TSH, T4TOTAL, FREET4, T3FREE, THYROIDAB in the last 72 hours. Anemia Panel: No results for input(s): VITAMINB12, FOLATE, FERRITIN, TIBC, IRON, RETICCTPCT in the last 72 hours. Urine analysis:    Component Value Date/Time   COLORURINE YELLOW 12/23/2016 2200   APPEARANCEUR CLEAR 12/23/2016 2200   LABSPEC 1.008 12/23/2016 2200   PHURINE 7.0 12/23/2016 2200   GLUCOSEU >=500 (A) 12/23/2016 2200   HGBUR NEGATIVE 12/23/2016 2200   BILIRUBINUR NEGATIVE 12/23/2016 2200   KETONESUR NEGATIVE 12/23/2016 2200   PROTEINUR 100 (A) 12/23/2016 2200   NITRITE NEGATIVE 12/23/2016 2200   LEUKOCYTESUR NEGATIVE 12/23/2016 2200   Sepsis Labs: @LABRCNTIP (procalcitonin:4,lacticidven:4)  ) Recent Results (from the past 240 hour(s))  Aerobic/Anaerobic Culture (surgical/deep wound)     Status: None (Preliminary result)   Collection Time: 02/21/18  8:18 AM  Result Value Ref Range Status   Specimen Description WOUND RIGHT FINGER LONG  Final   Special Requests PATIENT ON FOLLOWING ANCEF  Final   Gram Stain   Final    ABUNDANT WBC PRESENT, PREDOMINANTLY PMN MODERATE GRAM POSITIVE COCCI IN CLUSTERS FEW GRAM NEGATIVE RODS FEW GRAM POSITIVE RODS    Culture   Final    ABUNDANT PROTEUS MIRABILIS CULTURE REINCUBATED FOR BETTER GROWTH HOLDING FOR POSSIBLE  ANAEROBE Performed at [facility], [address], [place], [place] [address]    Report Status PENDING  Incomplete   Organism ID, Bacteria PROTEUS MIRABILIS  Final      Susceptibility   Proteus mirabilis - MIC*    AMPICILLIN <=2 SENSITIVE Sensitive     CEFAZOLIN <=4 SENSITIVE Sensitive     CEFEPIME <=1 SENSITIVE Sensitive     CEFTAZIDIME <=1 SENSITIVE Sensitive     CEFTRIAXONE <=1 SENSITIVE Sensitive     CIPROFLOXACIN <=0.25 SENSITIVE Sensitive     GENTAMICIN <=1 SENSITIVE Sensitive     IMIPENEM 2 SENSITIVE Sensitive     TRIMETH/SULFA <=20 SENSITIVE Sensitive     AMPICILLIN/SULBACTAM <=2 SENSITIVE Sensitive     PIP/TAZO <=4 SENSITIVE Sensitive     * ABUNDANT PROTEUS MIRABILIS      Studies: No results found.  Scheduled Meds: . amitriptyline  25 mg Oral Q2000  . [START ON 02/24/2018] Chlorhexidine Gluconate Cloth  6 each Topical Q0600  . darbepoetin (ARANESP) injection - DIALYSIS  60 mcg Intravenous Q Mon-HD  . doxercalciferol  1 mcg Intravenous Q M,W,F-HD  . Gerhardt's butt cream   Topical BID  . heparin  5,000 Units Subcutaneous Q8H  . insulin aspart  0-9 Units Subcutaneous TID WC  . insulin glargine  5 Units Subcutaneous QHS  . iron polysaccharides  150 mg Oral BID  . isosorbide mononitrate  30 mg Oral Daily  . losartan  50 mg Oral Daily  . multivitamin  1 tablet Oral Daily  . pantoprazole  40 mg Oral Daily  . sevelamer carbonate  1,600 mg Oral TID WC  . sodium hypochlorite   Irrigation Q1200    Continuous Infusions: . sodium chloride    . sodium chloride    . piperacillin-tazobactam (ZOSYN)  IV 3.375 g (02/23/18 0953)     LOS: 0 days     Hollice Espy, MD [facility]  To reach me or the doctor on call, go to: [email or web address] Password [email or web address]  02/23/2018, 7:07 PM

## 2018-02-23 NOTE — Progress Notes (Signed)
Hypoglycemic Event  CBG: 57  Treatment: D50 25 mL (12.5 gm)  Symptoms: None  Follow-up CBG: [id number] CBG Result:105  Possible Reasons for Event: Inadequate meal intake  Comments/MD notified: Hospitalist notified at [phone].   Ronie Spies

## 2018-02-23 NOTE — Progress Notes (Signed)
Received pt from [place], RN HD tx initiated @ 1900 by Velna Hatchet, RN via 15G x 2 w/o problem, per report Pull/push/flush well w/o problem, per report VSS, per report Will continue to monitor while on HD tx

## 2018-02-23 NOTE — Telephone Encounter (Signed)
Patient is admitted at [facility] and his wife will call when he is discharged so the capsule study can be rescheduled

## 2018-02-23 NOTE — Consult Note (Signed)
WOC Nurse wound consult note Reason for Consult:Nonhealing surgical incision at BKA surgical site on left.  Right BKA surgical site intact but scabbed in places with no drainage.  Moisture associated skin damage to bilateral buttocks in gluteal fold.  Wound type:Nonhealing surgical wound Moisture associated skin damage to buttocks, present on admission.  Pressure Injury POA: Yes Measurement:Left leg, surgical site:  0.5 cm x 16 cm wound bed is 50% adherent fibrin slough with necrotic odor.  Right leg surgical line with 3- 0.3 cm scabbed lesions, dry and intact 1 cm x 0.6 cm x 0.1 cm lesion to buttocks in gluteal fold.  Wound bed:see above Drainage (amount, consistency, odor) moderate serosanguinous with necrotic odor to left BKA site.  Periwound:dry skin Dressing procedure/placement/frequency:Cleanse left leg with NS and pat dry.  Dakins moist kerlix to necrotic wound bed x 3 days.  Cover with dry gauze and kerlix/tape.  Change daily.  Right leg open to air.  Gerhardts butt paste to buttocks. Turn and reposition every two hours.  Will not follow at this time.  Please re-consult if needed.  Maple Hudson MSN, RN, FNP-BC CWON Wound, Ostomy, Continence Nurse Pager [phone]

## 2018-02-24 ENCOUNTER — Encounter (HOSPITAL_COMMUNITY): Admission: RE | Payer: Self-pay | Source: Ambulatory Visit

## 2018-02-24 ENCOUNTER — Ambulatory Visit (HOSPITAL_COMMUNITY): Admission: RE | Admit: 2018-02-24 | Payer: Medicare Other | Source: Ambulatory Visit | Admitting: Gastroenterology

## 2018-02-24 ENCOUNTER — Other Ambulatory Visit: Payer: Self-pay | Admitting: *Deleted

## 2018-02-24 DIAGNOSIS — D649 Anemia, unspecified: Secondary | ICD-10-CM

## 2018-02-24 LAB — CBC
HCT: 25 % — ABNORMAL LOW (ref 39.0–52.0)
Hemoglobin: 7.8 g/dL — ABNORMAL LOW (ref 13.0–17.0)
MCH: 28.1 pg (ref 26.0–34.0)
MCHC: 31.2 g/dL (ref 30.0–36.0)
MCV: 89.9 fL (ref 80.0–100.0)
NRBC: 0 % (ref 0.0–0.2)
Platelets: 160 10*3/uL (ref 150–400)
RBC: 2.78 MIL/uL — ABNORMAL LOW (ref 4.22–5.81)
RDW: 15.7 % — ABNORMAL HIGH (ref 11.5–15.5)
WBC: 10.2 10*3/uL (ref 4.0–10.5)

## 2018-02-24 LAB — HEPATITIS B SURFACE ANTIGEN: Hepatitis B Surface Ag: NEGATIVE

## 2018-02-24 LAB — GLUCOSE, CAPILLARY
Glucose-Capillary: 94 mg/dL (ref 70–99)
Glucose-Capillary: 127 mg/dL — ABNORMAL HIGH (ref 70–99)

## 2018-02-24 SURGERY — AGILE CAPSULE

## 2018-02-24 MED ORDER — CIPROFLOXACIN HCL 500 MG PO TABS: 500.0000 mg | ORAL_TABLET | Freq: Two times a day (BID) | ORAL | 0 refills | Status: AC

## 2018-02-24 MED ORDER — HYDROCODONE-ACETAMINOPHEN 5-325 MG PO TABS: 1.0000 | ORAL_TABLET | Freq: Four times a day (QID) | ORAL | 0 refills | Status: AC | PRN

## 2018-02-24 NOTE — NC FL2 (Signed)
[place] MEDICAID FL2 LEVEL OF CARE SCREENING TOOL     IDENTIFICATION  Patient Name: Charles Vasquez Birthdate: 12/16/1935 Sex: male Admission Date (Current Location): 02/21/2018  [facility] [place] and [place] Number:  [employer] and Address:  [facility], [address] [place], [place] [address]      Provider Number: [id number]  Attending Physician Name and Address:  Burnadette Pop, MD  Relative Name and Phone Number:  [name] (sister)[phone]    Current Level of Care: Hospital Recommended Level of Care: Skilled Nursing Facility Prior Approval Number:    Date Approved/Denied:   PASRR Number: [id number]  Discharge Plan: SNF    Current Diagnoses: Patient Active Problem List   Diagnosis Date Noted  . Osteomyelitis (HCC) 02/23/2018  . Peripheral vascular disease due to secondary diabetes mellitus (HCC) 02/21/2018  . AVM (arteriovenous malformation) of small bowel, acquired 02/19/2018  . Left below-knee amputee (HCC) 01/02/2018  . Gangrene of left foot (HCC)   . Osteomyelitis of finger of right hand (HCC) 09/24/2017  . Severe protein-calorie malnutrition (HCC) 09/16/2017  . 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 Labile blood pressure   . Chronic left shoulder pain   . Bacteremia   . Labile blood glucose   . Hypertensive crisis   . Hypoglycemia   . Leukocytosis   . Acute blood loss anemia   . Anemia of chronic disease   . ESRD on dialysis (HCC)   . Type 2 diabetes mellitus with peripheral neuropathy (HCC)   . Benign essential HTN   . 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 Pressure injury of skin 07/13/2017  . Diabetic foot infection (HCC) 07/12/2017  . CAD (coronary artery disease) 07/12/2017  . Type II diabetes mellitus with renal manifestations (HCC) 07/12/2017  . Wound of right foot   . Loose stools 05/20/2017  . Abdominal pain    . Loss of weight 03/18/2017  . Abnormal CT scan, colon 03/18/2017  . Melena 03/18/2017  . Periumbilical abdominal pain 03/18/2017  . ESRD (end stage renal disease) on dialysis (HCC) 10/22/2016  . Venous hypertension status post AVG procedure 08/10/2016  . Anasarca 08/04/2016  . Facial swelling 08/04/2016  . Effusion, other site 08/04/2016  . 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 Acute respiratory failure (HCC) 08/04/2016  . Hyperkalemia 08/04/2016  . Aftercare following surgery of the circulatory system, NEC-Right  AVGG 09/25/2012  . Other complications due to renal dialysis device, implant, and graft 05/01/2012  . End stage renal disease (HCC) 03/17/2012  . FATIGUE, ACUTE 08/22/2008  . HEMATURIA UNSPECIFIED 07/11/2008  . Diabetes (HCC) 01/19/2008  . Essential hypertension 01/19/2008  . MYOCARDIAL INFARCTION, HX OF 01/19/2008  . GERD 01/19/2008  . OSTEOARTHRITIS 01/19/2008  . History of cardiovascular disorder 01/19/2008    Orientation RESPIRATION BLADDER Height & Weight     Self, Place  O2(nasal cannula 2L/min) External catheter, Incontinent Weight: 51.3 kg Height:  5\' 7"  (170.2 cm)  BEHAVIORAL SYMPTOMS/MOOD NEUROLOGICAL BOWEL NUTRITION STATUS      Continent Diet(see discharge summary)  AMBULATORY STATUS COMMUNICATION OF NEEDS Skin   Extensive Assist Verbally Surgical wounds(closed surgical incisions right finger and right arm, open wound left stump)                       Personal Care Assistance Level of Assistance  Bathing, Feeding, Dressing Bathing Assistance: Maximum assistance Feeding assistance: Maximum assistance Dressing  Assistance: Maximum assistance     Functional Limitations Info  Sight, Hearing, Speech Sight Info: Adequate Hearing Info: Adequate      SPECIAL CARE FACTORS FREQUENCY                       Contractures Contractures Info: Not present    Additional Factors Info  Code Status, Allergies Code Status Info: full Allergies Info:  Lisinopril           Current Medications (02/24/2018):  This is the current hospital active medication list Current Facility-Administered Medications  Medication Dose Route Frequency Provider Last Rate Last Dose  . 0.9 %  sodium chloride infusion  100 mL Intravenous PRN Ejigiri, Megan Mans, PA-C      . 0.9 %  sodium chloride infusion  100 mL Intravenous PRN Ejigiri, Megan Mans, PA-C      . acetaminophen (TYLENOL) tablet 325 mg  325 mg Oral QID PRN Myrtie Neither, MD   325 mg at 02/23/18 0854  . amitriptyline (ELAVIL) tablet 25 mg  25 mg Oral Q2000 Myrtie Neither, MD   25 mg at 02/23/18 2352  . ampicillin (OMNIPEN) 1 g in sodium chloride 0.9 % 100 mL IVPB  1 g Intravenous Q12H Hollice Espy, MD 300 mL/hr at 02/23/18 2345 1 g at 02/23/18 2345  . Chlorhexidine Gluconate Cloth 2 % PADS 6 each  6 each Topical Q0600 Tomasa Blase, PA-C      . Darbepoetin Alfa (ARANESP) injection 60 mcg  60 mcg Intravenous Q Mon-HD Tomasa Blase, PA-C   60 mcg at 02/23/18 2105  . dicyclomine (BENTYL) capsule 10 mg  10 mg Oral PRN Myrtie Neither, MD      . doxercalciferol (HECTOROL) injection 1 mcg  1 mcg Intravenous Q M,W,F-HD Tomasa Blase, PA-C   1 mcg at 02/23/18 2105  . Gerhardt's butt cream   Topical BID Hollice Espy, MD      . heparin injection 1,000 Units  20 Units/kg Dialysis PRN Tomasa Blase, PA-C      . heparin injection 5,000 Units  5,000 Units Subcutaneous Q8H Myrtie Neither, MD   5,000 Units at 02/24/18 [phone]  . HYDROcodone-acetaminophen (NORCO/VICODIN) 5-325 MG per tablet 1 tablet  1 tablet Oral Q6H PRN Myrtie Neither, MD   1 tablet at 02/22/18 0843  . insulin aspart (novoLOG) injection 0-9 Units  0-9 Units Subcutaneous TID WC Myrtie Neither, MD   1 Units at 02/22/18 1732  . insulin glargine (LANTUS) injection 5 Units  5 Units Subcutaneous QHS Myrtie Neither, MD   5 Units at 02/22/18 2136  . ipratropium (ATROVENT) nebulizer solution 0.4 mg   0.4 mg Nebulization Q6H PRN Myrtie Neither, MD      . iron polysaccharides (NIFEREX) capsule 150 mg  150 mg Oral BID Myrtie Neither, MD   150 mg at 02/23/18 2351  . isosorbide mononitrate (IMDUR) 24 hr tablet 30 mg  30 mg Oral Daily Myrtie Neither, MD   30 mg at 02/23/18 0848  . lidocaine-prilocaine (EMLA) cream 1 application  1 application Topical PRN Tomasa Blase, PA-C      . losartan (COZAAR) tablet 50 mg  50 mg Oral Daily Myrtie Neither, MD   50 mg at 02/23/18 0848  . multivitamin (RENA-VIT) tablet 1 tablet  1 tablet Oral Daily Myrtie Neither, MD   1 tablet at 02/23/18 0846  . pantoprazole (PROTONIX) EC tablet 40 mg  40 mg Oral Daily Myrtie Neither, MD  40 mg at 02/23/18 0848  . pentafluoroprop-tetrafluoroeth (GEBAUERS) aerosol 1 application  1 application Topical PRN Ejigiri, Megan Mans, PA-C      . senna (SENOKOT) tablet 17.2 mg  2 tablet Oral Daily PRN Myrtie Neither, MD      . sevelamer carbonate (RENVELA) tablet 1,600 mg  1,600 mg Oral TID WC Myrtie Neither, MD   1,600 mg at 02/23/18 1650  . sodium hypochlorite (DAKIN'S 1/4 STRENGTH) topical solution   Irrigation Q1200 Hollice Espy, MD       Facility-Administered Medications Ordered in Other Encounters  Medication Dose Route Frequency Provider Last Rate Last Dose  . 0.9 %  sodium chloride infusion (Manually program via Guardrails IV Fluids)   Intravenous Once Rayburn, Fanny Bien, PA-C         Discharge Medications: Please see discharge summary for a list of discharge medications.  Relevant Imaging Results:  Relevant Lab Results:   Additional Information SSN: [id number]  Gildardo Griffes, LCSW

## 2018-02-24 NOTE — Clinical Social Work Note (Signed)
Clinical Social Work Assessment  Patient Details  Name: Charles Vasquez MRN: [id number] Date of Birth: [birth year]  Date of referral:  02/24/18               Reason for consult:  Discharge Planning                Permission sought to share information with:  Case Manager, Facility Medical sales representative, Family Supports Permission granted to share information::  Yes, Verbal Permission Granted  Name::     Charles Vasquez  Agency::  SNFs  Relationship::  sister  Contact Information:  [phone]  Housing/Transportation Living arrangements for the past 2 months:  Skilled Nursing Facility([facility]) Source of Information:  Patient Patient Interpreter Needed:  None Criminal Activity/Legal Involvement Pertinent to Current Situation/Hospitalization:  No - Comment as needed Significant Relationships:  Siblings Lives with:  Facility Resident Do you feel safe going back to the place where you live?  Yes Need for family participation in patient care:  Yes (Comment)  Care giving concerns:  CSW received referral for possible SNF placement at time of discharge. Spoke with patient's sister Charles Vasquez due to patient's lack of orientation regarding possibility of SNF placement . Patient's family   is currently unable to care for him at their home given patient's current needs and fall risk.  Patient family expressed understanding of PT recommendation and are agreeable to SNF placement at time of discharge. CSW to continue to follow and assist with discharge planning needs.     Social Worker assessment / plan:  Spoke with patient's sister Charles Vasquez concerning possibility of rehab at SNF before returning home. Patient is from [facility] and will return upon discharge. [facility] able to accept patient back when patient medically ready to dc.    Employment status:  Retired Database administrator PT Recommendations:  Skilled Nursing Facility Information / Referral to community resources:   Skilled Nursing Facility  Patient/Family's Response to care:  Patient  recognizes need for rehab before returning home and are agreeable to a SNF in [place]. They report preference for   [facility] where he has been staying . CSW explained insurance authorization process. Patient's family reported that they want patient to get stronger to be able to come back home.    Patient/Family's Understanding of and Emotional Response to Diagnosis, Current Treatment, and Prognosis:  Patient/family is realistic regarding therapy needs and expressed being hopeful for SNF placement. Patient expressed understanding of CSW role and discharge process as well as medical condition. No questions/concerns about plan or treatment.    Emotional Assessment Appearance:  Appears stated age Attitude/Demeanor/Rapport:  Unable to Assess Affect (typically observed):  Unable to Assess Orientation:  Oriented to Self, Oriented to Place Alcohol / Substance use:  Not Applicable Psych involvement (Current and /or in the community):  No (Comment)  Discharge Needs  Concerns to be addressed:  Discharge Planning Concerns Readmission within the last 30 days:  No Current discharge risk:  Dependent with Mobility Barriers to Discharge:  Continued Medical Work up   [employer], LCSW 02/24/2018, 11:48 AM

## 2018-02-24 NOTE — Telephone Encounter (Addendum)
Spoke with Charles Vasquez and we have scheduled patient for 03/09/18 at 7:30am. I have faxed instructions to [phone]. I called patient sister Hilda Lias and made her aware of appt as well. She voiced understanding. Orders entered.

## 2018-02-24 NOTE — Progress Notes (Signed)
Report called to Debbie at [facility].  Patient denies any pain and no distress noted.  Patient has been fed lunch by family member.  Dressings to right finger and Left BKA clean, dry and intact.  Awaiting PTAR to pick up patient.

## 2018-02-24 NOTE — Progress Notes (Signed)
Left stump dressing change done with dakin's solution and kerlix. Dressing C/D/I. Dr. Renford Dills notified dressing change orders were not continued to SNF. No new orders. IV removed. Pt with new pink foam on sacrum C/D/I. AVS given to PTAR.

## 2018-02-24 NOTE — Progress Notes (Addendum)
[facility] Progress Note   Subjective: Seen in room. Alert. No c/os. Pain controlled.   Objective Vitals:   02/23/18 2215 02/23/18 2236 02/23/18 2336 02/24/18 0339  BP: (!) 101/56 (!) 162/76 (!) 140/95 (!) 130/92  Pulse: 85 85 87 86  Resp: 14 15 14 16   Temp:  97.8 F (36.6 C) 97.9 F (36.6 C) 98 F (36.7 C)  TempSrc:  Oral Oral Oral  SpO2: 97% 98% 90% 94%  Weight:  51.3 kg    Height:       Physical Exam General: Thin elderly male NAD Heart: RRR No murmur Lungs: CTAB Abdomen: BS+ soft NT Extremities: bilateral BKA L stump wrapped. No edema R stump. R hand bandaged  Dialysis Access: L thigh AVG +bruit   Dialysis Orders:  MWF - [name] 3.25hrs, [id number] Access:L thigh AVG Heparin2000 load and 500 qhr EPO 14000qHD Hectorol IV qHD  Assessment/Plan: 1. R finger gangrene s/p I&D of right 3rd finger with MCP joint amputation on 02/21/18. Wound cultures +Proteus mirabilis to date. IV antibiotics - per primary/ortho.  2. ESRD -  MWF. Next HD 12/18. Will write orders in case still here.  3. Hypertension/volume  - BP ok. No gross volume on exam. Net UF 1.5L post HD wt 51.3kg.  UF 0.5-1L with next HD 4. Anemia  - Hgb 7.8. Follow trend. Got Aranesp 60 with HD 12/16. Follow trends. Looks like he is scheduled for outpatient GI workup.  5. Metabolic bone disease -  Continue VDRA/binder 6. DM- insulin per primary    Tomasa Blase PA-C [facility] Pager [phone] 02/24/2018,11:03 AM  LOS: 1 day    Pt seen, examined and agree w A/P as above.  Vinson Moselle MD [facility] pager [phone]   02/24/2018, 3:04 PM     Additional Objective Labs: Basic Metabolic Panel: Recent Labs  Lab 02/21/18 0644 02/21/18 1229 02/22/18 0449 02/23/18 0337  NA 136  --  136 136  K 3.6  --  4.4 4.4  CL  --   --  97* 97*  CO2  --   --  23 22  GLUCOSE 198*  --  91 76  BUN  --   --  63* 74*  CREATININE  --   3.83* 5.17* 6.55*  CALCIUM  --   --  7.2* 7.5*   CBC: Recent Labs  Lab 02/21/18 1229 02/22/18 0449 02/23/18 0337 02/24/18 0355  WBC 10.3 10.6* 10.4 10.2  HGB 9.1* 7.5* 7.3* 7.8*  HCT 30.1* 25.4* 24.5* 25.0*  MCV 93.5 93.7 92.8 89.9  PLT 179 200 174 160   Blood Culture    Component Value Date/Time   SDES WOUND RIGHT FINGER LONG 02/21/2018 0818   SPECREQUEST PATIENT ON FOLLOWING ANCEF 02/21/2018 0818   CULT  02/21/2018 0818    ABUNDANT PROTEUS MIRABILIS FEW STAPHYLOCOCCUS AUREUS NO ANAEROBES ISOLATED; CULTURE IN PROGRESS FOR 5 DAYS    REPTSTATUS PENDING 02/21/2018 0818    Cardiac Enzymes: No results for input(s): CKTOTAL, CKMB, CKMBINDEX, TROPONINI in the last 168 hours. CBG: Recent Labs  Lab 02/23/18 0636 02/23/18 1157 02/23/18 1614 02/23/18 2322 02/24/18 0622  GLUCAP 105* 87 104* 87 94   Iron Studies: No results for input(s): IRON, TIBC, TRANSFERRIN, FERRITIN in the last 72 hours. Lab Results  Component Value Date   INR 1.12 07/12/2017   INR 0.93 08/04/2016   Medications: . sodium chloride    . sodium chloride    . ampicillin (OMNIPEN) IV  1 g (02/23/18 2345)   . amitriptyline  25 mg Oral Q2000  . Chlorhexidine Gluconate Cloth  6 each Topical Q0600  . darbepoetin (ARANESP) injection - DIALYSIS  60 mcg Intravenous Q Mon-HD  . doxercalciferol  1 mcg Intravenous Q M,W,F-HD  . Gerhardt's butt cream   Topical BID  . heparin  5,000 Units Subcutaneous Q8H  . insulin aspart  0-9 Units Subcutaneous TID WC  . insulin glargine  5 Units Subcutaneous QHS  . iron polysaccharides  150 mg Oral BID  . isosorbide mononitrate  30 mg Oral Daily  . losartan  50 mg Oral Daily  . multivitamin  1 tablet Oral Daily  . pantoprazole  40 mg Oral Daily  . sevelamer carbonate  1,600 mg Oral TID WC  . sodium hypochlorite   Irrigation Q1200

## 2018-02-24 NOTE — Discharge Summary (Signed)
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  Patient seen by orthopedic surgery and underwent MCP joint level amputation with fillet flap closure.  Cultures returned back positive for pansensitive Proteus mirabilis which was found to be pretty pansensitive.  Antibiotics changed to ciprofloxacin on discharge which will be continued for 10 more days. He will follow-up with orthopedics as an outpatient.He is going to be discharged to skilled nursing facility today.  Following problems were addressed during his hospitalization:  Assessment/Plan: Principal Problem:   Osteomyelitis of finger of right hand [facility]): Status post I&D with MCP joint level amputation with fillet flap closure.  Cultures back for Proteus mirabilis, pansensitive.  Was on IV Zosyn.  Given culture  findings, will narrow IV Zosyn and continue PO antibiotics now.  He should follow up with orthopedics as an outpatient within a week.  Essential hypertension: Stable, continue home medications    ESRD (end stage renal disease) on dialysis [facility]): Seen by nephrology here.Continue dialysis as an outpatient.    Type II diabetes mellitus with renal manifestations [facility]): Continue previous regimen.    Anemia of chronic disease: Hemoglobin at 7.8 today. Check CBC in a week.    Peripheral vascular disease due to secondary diabetes mellitus .   Discharge Diagnoses:  Principal Problem:   Osteomyelitis of finger of  right hand (HCC) Active Problems:   Diabetes (HCC)   Essential hypertension   GERD   HEMATURIA UNSPECIFIED   History of cardiovascular disorder   End stage renal disease (HCC)   ESRD (end stage renal disease) on dialysis (HCC)   Melena   CAD (coronary artery disease)   Type II diabetes mellitus with renal manifestations (HCC)   Amputation of right lower extremity below knee upon examination (HCC)   Anemia of chronic disease   Type 2 diabetes mellitus with peripheral neuropathy (HCC)   Left below-knee amputee (HCC)   Peripheral vascular disease due to secondary diabetes mellitus (HCC)   Osteomyelitis (HCC)    Discharge Instructions  Discharge Instructions    Diet - low sodium heart healthy   Complete by:  As directed    Discharge instructions   Complete by:  As directed    1) Take prescribed  medications as instructed. 2)Follow up with orthopedics as an outpatient within a week. Name and number the provider has been attached.   Increase activity slowly   Complete by:  As directed      Allergies as of 02/24/2018      Reactions   Lisinopril Other (See Comments)   HYPERKALEMIA RENAL DYSFUNCTION PROGRESSION      Medication List    TAKE these medications   acetaminophen 325 MG tablet Commonly known as:  TYLENOL Take 325 mg by mouth 4 (four) times daily as needed for mild pain or fever.   amitriptyline 25 MG tablet Commonly known as:  ELAVIL Take 25 mg by mouth daily at 8 pm.   aspirin EC 81 MG tablet Take 81 mg by mouth daily.   ciprofloxacin 500 MG tablet Commonly known as:  CIPRO Take 1 tablet (500 mg total) by mouth 2 (two) times daily for 10 days.   dextrose 40 % Gel Commonly known as:  GLUTOSE Take 1 Tube by mouth once as needed for low blood sugar.   dicyclomine 10 MG capsule Commonly known as:  BENTYL 1 PO 30 MINUTES PRIOR TO BREAKFAST AND LUNCH What changed:    how much to take  how to take this  when to take this  reasons to take  this  additional instructions   HYDROcodone-acetaminophen 5-325 MG tablet Commonly known as:  NORCO/VICODIN Take 1 tablet by mouth every 6 (six) hours as needed for moderate pain.   insulin lispro 100 UNIT/ML injection Commonly known as:  HUMALOG Inject 0-8 Units into the skin 3 (three) times daily before meals. Blood sugar of 70-100=0 units 101-250=1 unit 151-200=2 units 201-250=4 units  251-300=6 units 301-350= 8 units Greater than 350= 10 units **Call MD if glucose greater than 450**   ipratropium 0.02 % nebulizer solution Commonly known as:  ATROVENT Take 0.4 mg by nebulization every 6 (six) hours as needed (congestion).   iron polysaccharides 150 MG capsule Commonly known as:  NIFEREX Take 1 capsule (150 mg total) by mouth 2 (two) times daily.   isosorbide mononitrate 30 MG 24 hr tablet Commonly known as:  IMDUR Take 30 mg by mouth daily.   losartan 50 MG tablet Commonly known as:  COZAAR Take 50 mg by mouth daily.   multivitamin Tabs tablet Take 1 tablet by mouth daily.   NOVOLOG 100 UNIT/ML injection Generic drug:  insulin aspart Inject into the skin 3 (three) times daily before meals. Sliding scale   pantoprazole 40 MG tablet Commonly known as:  PROTONIX Take 1 tablet (40 mg total) by mouth 2 (two) times daily with a meal. What changed:    when to take this  reasons to take this   polyethylene glycol packet Commonly known as:  MIRALAX / GLYCOLAX Take 17 g by mouth as needed for moderate constipation.   senna 8.6 MG tablet Commonly known as:  SENOKOT Take 2 tablets by mouth daily as needed for constipation.   sevelamer carbonate 800 MG tablet Commonly known as:  RENVELA Take 2 tablets (1,600 mg total) by mouth 3 (three) times daily with meals. What changed:  additional instructions      Follow-up Information    Dairl Ponder, MD Follow up in 3 day(s).   Specialty:  Orthopedic Surgery Why:  For wound re-check Contact information: [id number] [place] [address] [phone]          Allergies  Allergen Reactions  . Lisinopril Other (See Comments)    HYPERKALEMIA RENAL DYSFUNCTION PROGRESSION     Consultations:  Orthopedics   Procedures/Studies:  No results found.    Subjective: Patient seen and examined the bedside this morning.  Remains comfortable.  Hemodynamically stable.  Stable for discharge today.  Discharge Exam: Vitals:   02/23/18 2336 02/24/18 0339  BP: (!) 140/95 (!) 130/92  Pulse: 87 86  Resp: 14 16  Temp: 97.9 F (36.6 C) 98 F (36.7 C)  SpO2: 90% 94%   Vitals:   02/23/18 2215 02/23/18 2236 02/23/18 2336 02/24/18 0339  BP: (!) 101/56 (!) 162/76 (!) 140/95 (!) 130/92  Pulse: 85 85 87 86  Resp: 14 15 14 16   Temp:  97.8 F (36.6 C) 97.9 F (36.6 C) 98 F (36.7 C)  TempSrc:  Oral Oral Oral  SpO2: 97% 98% 90% 94%  Weight:  51.3 kg    Height:        General: Pt is alert, awake, not in acute distress Cardiovascular: RRR, S1/S2 +, no rubs, no gallops Respiratory: CTA bilaterally, no wheezing, no rhonchi Abdominal: Soft, NT, ND, bowel sounds + Extremities: no edema, no cyanosis,right BKA, right hand wrapped with dressing    The results of significant diagnostics from this hospitalization (including imaging, microbiology, ancillary and laboratory) are listed below for reference.     Microbiology: Recent Results (from the past 240 hour(s))  Aerobic/Anaerobic Culture (surgical/deep wound)     Status: None (Preliminary result)   Collection Time: 02/21/18  8:18 AM  Result Value Ref Range Status   Specimen Description WOUND RIGHT FINGER LONG  Final   Special Requests PATIENT ON FOLLOWING ANCEF  Final   Gram Stain   Final    ABUNDANT WBC PRESENT, PREDOMINANTLY PMN MODERATE GRAM POSITIVE COCCI IN CLUSTERS FEW GRAM NEGATIVE RODS FEW GRAM POSITIVE RODS Performed at [facility], [address], [place], [place] [address]    Culture   Final    ABUNDANT PROTEUS  MIRABILIS FEW STAPHYLOCOCCUS AUREUS NO ANAEROBES ISOLATED; CULTURE IN PROGRESS FOR 5 DAYS    Report Status PENDING  Incomplete   Organism ID, Bacteria PROTEUS MIRABILIS  Final      Susceptibility   Proteus mirabilis - MIC*    AMPICILLIN <=2 SENSITIVE Sensitive     CEFAZOLIN <=4 SENSITIVE Sensitive     CEFEPIME <=1 SENSITIVE Sensitive     CEFTAZIDIME <=1 SENSITIVE Sensitive     CEFTRIAXONE <=1 SENSITIVE Sensitive     CIPROFLOXACIN <=0.25 SENSITIVE Sensitive     GENTAMICIN <=1 SENSITIVE Sensitive     IMIPENEM 2 SENSITIVE Sensitive     TRIMETH/SULFA <=20 SENSITIVE Sensitive     AMPICILLIN/SULBACTAM <=2 SENSITIVE Sensitive     PIP/TAZO <=4 SENSITIVE Sensitive     * ABUNDANT PROTEUS MIRABILIS     Labs: BNP (last 3 results) No results for input(s): BNP in the last 8760 hours. Basic Metabolic Panel: Recent Labs  Lab 02/21/18 0644 02/21/18 1229 02/22/18 0449 02/23/18 0337  NA 136  --  136 136  K 3.6  --  4.4 4.4  CL  --   --  97* 97*  CO2  --   --  23 22  GLUCOSE 198*  --  91 76  BUN  --   --  63* 74*  CREATININE  --  3.83* 5.17* 6.55*  CALCIUM  --   --  7.2* 7.5*   Liver Function Tests: No results for input(s): AST, ALT, ALKPHOS, BILITOT, PROT, ALBUMIN in the last 168 hours. No results for input(s): LIPASE, AMYLASE in the last 168 hours. No results for input(s): AMMONIA in the last 168 hours. CBC: Recent Labs  Lab 02/21/18 0644 02/21/18 1229 02/22/18 0449 02/23/18 0337 02/24/18 0355  WBC  --  10.3 10.6* 10.4 10.2  HGB 7.5* 9.1* 7.5* 7.3* 7.8*  HCT 22.0* 30.1* 25.4* 24.5* 25.0*  MCV  --  93.5 93.7 92.8 89.9  PLT  --  179 200 174 160   Cardiac Enzymes: No results for input(s): CKTOTAL, CKMB, CKMBINDEX, TROPONINI in the last 168 hours. BNP: Invalid input(s): POCBNP CBG: Recent Labs  Lab 02/23/18 0636 02/23/18 1157 02/23/18 1614 02/23/18 2322 02/24/18 0622  GLUCAP 105* 87 104* 87 94   D-Dimer No results for input(s): DDIMER in the last 72 hours. Hgb  A1c No results for input(s): HGBA1C in the last  72 hours. Lipid Profile No results for input(s): CHOL, HDL, LDLCALC, TRIG, CHOLHDL, LDLDIRECT in the last 72 hours. Thyroid function studies No results for input(s): TSH, T4TOTAL, T3FREE, THYROIDAB in the last 72 hours.  Invalid input(s): FREET3 Anemia work up No results for input(s): VITAMINB12, FOLATE, FERRITIN, TIBC, IRON, RETICCTPCT in the last 72 hours. Urinalysis    Component Value Date/Time   COLORURINE YELLOW 12/23/2016 2200   APPEARANCEUR CLEAR 12/23/2016 2200   LABSPEC 1.008 12/23/2016 2200   PHURINE 7.0 12/23/2016 2200   GLUCOSEU >=500 (A) 12/23/2016 2200   HGBUR NEGATIVE 12/23/2016 2200   BILIRUBINUR NEGATIVE 12/23/2016 2200   KETONESUR NEGATIVE 12/23/2016 2200   PROTEINUR 100 (A) 12/23/2016 2200   NITRITE NEGATIVE 12/23/2016 2200   LEUKOCYTESUR NEGATIVE 12/23/2016 2200   Sepsis Labs Invalid input(s): PROCALCITONIN,  WBC,  LACTICIDVEN Microbiology Recent Results (from the past 240 hour(s))  Aerobic/Anaerobic Culture (surgical/deep wound)     Status: None (Preliminary result)   Collection Time: 02/21/18  8:18 AM  Result Value Ref Range Status   Specimen Description WOUND RIGHT FINGER LONG  Final   Special Requests PATIENT ON FOLLOWING ANCEF  Final   Gram Stain   Final    ABUNDANT WBC PRESENT, PREDOMINANTLY PMN MODERATE GRAM POSITIVE COCCI IN CLUSTERS FEW GRAM NEGATIVE RODS FEW GRAM POSITIVE RODS Performed at [facility], [address], [place], [place] [address]    Culture   Final    ABUNDANT PROTEUS MIRABILIS FEW STAPHYLOCOCCUS AUREUS NO ANAEROBES ISOLATED; CULTURE IN PROGRESS FOR 5 DAYS    Report Status PENDING  Incomplete   Organism ID, Bacteria PROTEUS MIRABILIS  Final      Susceptibility   Proteus mirabilis - MIC*    AMPICILLIN <=2 SENSITIVE Sensitive     CEFAZOLIN <=4 SENSITIVE Sensitive     CEFEPIME <=1 SENSITIVE Sensitive     CEFTAZIDIME <=1 SENSITIVE Sensitive     CEFTRIAXONE <=1  SENSITIVE Sensitive     CIPROFLOXACIN <=0.25 SENSITIVE Sensitive     GENTAMICIN <=1 SENSITIVE Sensitive     IMIPENEM 2 SENSITIVE Sensitive     TRIMETH/SULFA <=20 SENSITIVE Sensitive     AMPICILLIN/SULBACTAM <=2 SENSITIVE Sensitive     PIP/TAZO <=4 SENSITIVE Sensitive     * ABUNDANT PROTEUS MIRABILIS    Please note: You were cared for by a hospitalist during your hospital stay. Once you are discharged, your primary care physician will handle any further medical issues. Please note that NO REFILLS for any discharge medications will be authorized once you are discharged, as it is imperative that you return to your primary care physician (or establish a relationship with a primary care physician if you do not have one) for your post hospital discharge needs so that they can reassess your need for medications and monitor your lab values.    Time coordinating discharge: 40 minutes  SIGNED:   Burnadette Pop, MD  [facility] 02/24/2018, 11:29 AM Pager [phone]  If 7PM-7AM, please contact night-coverage [email or web address] Password [email or web address]

## 2018-02-24 NOTE — Clinical Social Work Placement (Signed)
   CLINICAL SOCIAL WORK PLACEMENT  NOTE  Date:  02/24/2018  Patient Details  Name: [name] MRN: [id number] Date of Birth: [birth year]  Clinical Social Work is seeking post-discharge placement for this patient at the Skilled  Nursing Facility level of care (*CSW will initial, date and re-position this form in  chart as items are completed):      Patient/family provided with [facility] Clinical Social Work Department's list of facilities offering this level of care within the geographic area requested by the patient (or if unable, by the patient's family).  Yes   Patient/family informed of their freedom to choose among providers that offer the needed level of care, that participate in Medicare, Medicaid or managed care program needed by the patient, have an available bed and are willing to accept the patient.      Patient/family informed of [facility]'s ownership interest in [facility] and [facility], as well as of the fact that they are under no obligation to receive care at these facilities.  PASRR submitted to EDS on       PASRR number received on 02/24/18     Existing PASRR number confirmed on       FL2 transmitted to all facilities in geographic area requested by pt/family on 02/24/18     FL2 transmitted to all facilities within larger geographic area on       Patient informed that his/her managed care company has contracts with or will negotiate with certain facilities, including the following:        Yes   Patient/family informed of bed offers received.  Patient chooses bed at [facility]     Physician recommends and patient chooses bed at      Patient to be transferred to [facility] on 02/24/18.  Patient to be transferred to facility by [facility]     Patient family notified on 02/24/18 of transfer.  Name of family member notified:  [name] (sister)     PHYSICIAN       Additional Comment:     _______________________________________________ Gildardo Griffes, LCSW 02/24/2018, 11:51 AM

## 2018-02-24 NOTE — Telephone Encounter (Signed)
LMTCB for [name] (transporter) at [facility].

## 2018-02-24 NOTE — Progress Notes (Signed)
Patient will DC to: [facility] Anticipated DC date: 02/24/18 Family notified: [name] Transport by: Sharin Mons  Per MD patient ready for DC to [facility] . RN, patient, patient's family, and facility notified of DC. Discharge Summary sent to facility. RN given number for report [phone] . DC packet on chart. Ambulance transport requested for patient.  CSW signing off.  [place], [place] [phone]

## 2018-02-25 ENCOUNTER — Telehealth: Payer: Self-pay | Admitting: Gastroenterology

## 2018-02-25 DIAGNOSIS — D649 Anemia, unspecified: Secondary | ICD-10-CM | POA: Diagnosis not present

## 2018-02-25 DIAGNOSIS — Z992 Dependence on renal dialysis: Secondary | ICD-10-CM | POA: Diagnosis not present

## 2018-02-25 DIAGNOSIS — R531 Weakness: Secondary | ICD-10-CM | POA: Diagnosis not present

## 2018-02-25 DIAGNOSIS — N186 End stage renal disease: Secondary | ICD-10-CM | POA: Diagnosis not present

## 2018-02-25 LAB — HEPATITIS B SURFACE ANTIGEN: Hepatitis B Surface Ag: NEGATIVE

## 2018-02-25 NOTE — Telephone Encounter (Signed)
ATC Melanie in endo. Will call back

## 2018-02-25 NOTE — Telephone Encounter (Signed)
LMTCB for Charles Vasquez at the [facility]

## 2018-02-25 NOTE — Telephone Encounter (Signed)
[facility] called and wanted patient appointment moved to sooner, his office visit was moved to an opening in January and they also wanted his capsule study moved sooner than 12/30.  Stated he was losing blood and had to go every few days to get more. [phone] was the phone from [facility]

## 2018-02-26 DIAGNOSIS — R531 Weakness: Secondary | ICD-10-CM | POA: Diagnosis not present

## 2018-02-26 DIAGNOSIS — D649 Anemia, unspecified: Secondary | ICD-10-CM | POA: Diagnosis not present

## 2018-02-26 LAB — AEROBIC/ANAEROBIC CULTURE W GRAM STAIN (SURGICAL/DEEP WOUND)

## 2018-02-26 NOTE — Telephone Encounter (Signed)
[name] called back. Patient can do 03/02/18. I have faxed new instructions to her at [phone]. Called [email or web address]-[email or web address]

## 2018-02-26 NOTE — Telephone Encounter (Signed)
Spoke with [place] in Endo. She stated we can schedule for 12/23 at 7:30am but inform them patient will need to have xray on 12/24. Called [name] (transporter) at [facility]

## 2018-02-27 DIAGNOSIS — S68114A Complete traumatic metacarpophalangeal amputation of right ring finger, initial encounter: Secondary | ICD-10-CM | POA: Diagnosis not present

## 2018-02-27 DIAGNOSIS — I5022 Chronic systolic (congestive) heart failure: Secondary | ICD-10-CM | POA: Diagnosis not present

## 2018-02-27 DIAGNOSIS — I7389 Other specified peripheral vascular diseases: Secondary | ICD-10-CM | POA: Diagnosis not present

## 2018-02-27 DIAGNOSIS — N186 End stage renal disease: Secondary | ICD-10-CM | POA: Diagnosis not present

## 2018-02-27 DIAGNOSIS — Z992 Dependence on renal dialysis: Secondary | ICD-10-CM | POA: Diagnosis not present

## 2018-03-01 DIAGNOSIS — N186 End stage renal disease: Secondary | ICD-10-CM | POA: Diagnosis not present

## 2018-03-01 DIAGNOSIS — Z992 Dependence on renal dialysis: Secondary | ICD-10-CM | POA: Diagnosis not present

## 2018-03-02 ENCOUNTER — Encounter (HOSPITAL_COMMUNITY)
Admission: RE | Disposition: A | Discharge status: Home / Self Care | Payer: Self-pay | Source: Ambulatory Visit | Attending: Gastroenterology

## 2018-03-02 ENCOUNTER — Ambulatory Visit (HOSPITAL_COMMUNITY)
Admission: RE | Admit: 2018-03-02 | Discharge: 2018-03-02 | Disposition: A | Discharge status: Home / Self Care | Payer: Medicare Other | Source: Ambulatory Visit | Attending: Gastroenterology | Admitting: Gastroenterology

## 2018-03-02 ENCOUNTER — Encounter (HOSPITAL_COMMUNITY): Payer: Self-pay | Admitting: *Deleted

## 2018-03-02 DIAGNOSIS — D649 Anemia, unspecified: Secondary | ICD-10-CM | POA: Diagnosis not present

## 2018-03-02 DIAGNOSIS — I998 Other disorder of circulatory system: Secondary | ICD-10-CM | POA: Diagnosis not present

## 2018-03-02 HISTORY — PX: AGILE CAPSULE: SHX5420

## 2018-03-02 SURGERY — AGILE CAPSULE

## 2018-03-02 MED ORDER — SODIUM CHLORIDE 0.9 % IV SOLN: INTRAVENOUS | Status: DC

## 2018-03-03 ENCOUNTER — Ambulatory Visit (HOSPITAL_COMMUNITY): Payer: Medicare Other

## 2018-03-03 DIAGNOSIS — I998 Other disorder of circulatory system: Secondary | ICD-10-CM | POA: Diagnosis not present

## 2018-03-03 DIAGNOSIS — D649 Anemia, unspecified: Secondary | ICD-10-CM | POA: Insufficient documentation

## 2018-03-05 DIAGNOSIS — Z992 Dependence on renal dialysis: Secondary | ICD-10-CM | POA: Diagnosis not present

## 2018-03-05 DIAGNOSIS — N186 End stage renal disease: Secondary | ICD-10-CM | POA: Diagnosis not present

## 2018-03-06 ENCOUNTER — Encounter: Payer: Self-pay | Admitting: Vascular Surgery

## 2018-03-06 ENCOUNTER — Encounter: Payer: Self-pay | Admitting: *Deleted

## 2018-03-06 ENCOUNTER — Other Ambulatory Visit: Payer: Self-pay | Admitting: *Deleted

## 2018-03-06 ENCOUNTER — Other Ambulatory Visit: Payer: Self-pay

## 2018-03-06 ENCOUNTER — Ambulatory Visit (INDEPENDENT_AMBULATORY_CARE_PROVIDER_SITE_OTHER): Payer: Medicare Other | Admitting: Vascular Surgery

## 2018-03-06 VITALS — BP 121/62 | HR 82 | Temp 97.4°F | Resp 20 | Ht 67.0 in | Wt 113.0 lb

## 2018-03-06 DIAGNOSIS — E119 Type 2 diabetes mellitus without complications: Secondary | ICD-10-CM | POA: Diagnosis not present

## 2018-03-06 DIAGNOSIS — E1351 Other specified diabetes mellitus with diabetic peripheral angiopathy without gangrene: Secondary | ICD-10-CM

## 2018-03-06 DIAGNOSIS — E785 Hyperlipidemia, unspecified: Secondary | ICD-10-CM | POA: Diagnosis not present

## 2018-03-06 DIAGNOSIS — N186 End stage renal disease: Secondary | ICD-10-CM | POA: Diagnosis not present

## 2018-03-06 DIAGNOSIS — Z79899 Other long term (current) drug therapy: Secondary | ICD-10-CM | POA: Diagnosis not present

## 2018-03-06 DIAGNOSIS — Z992 Dependence on renal dialysis: Secondary | ICD-10-CM | POA: Diagnosis not present

## 2018-03-06 DIAGNOSIS — Z794 Long term (current) use of insulin: Secondary | ICD-10-CM | POA: Diagnosis not present

## 2018-03-06 NOTE — Progress Notes (Signed)
Patient name: Charles Vasquez MRN: [id number] DOB: [birth year] Sex: male  REASON FOR VISIT: Nonhealing left below-knee amputation  HPI: Charles Vasquez is a 82 y.o. male with history of end-stage renal disease the presents for evaluation of a left nonhealing below-knee amputation per his brother and friend that are here during his visit.  Per chart review patient has a bilateral lower extremity below-knee amputations.  The right below-knee amputation was done on 07/15/2017 by [name].  The most recent left below-knee amputation was done by Dr. Lajoyce Corners in October 2019.  Family states that below-knee amputation is broken down and has been nonhealing now for over a month.  Patient is in a nursing facility and is nonambulatory per his family.  Denies fever or chills.  Past Medical History:  Diagnosis Date  . Anal pain   . Anemia   . AVF (arteriovenous fistula) (HCC) 05-13-2017 per pt currently AVF access used is left thigh   hx multiple AVF surgery's (previously left upper arm, bilateral thigh) last surgery -- right upper arm creation 11-26-2016  . Constipation   . Coronary artery disease   . ESRD (end stage renal disease) on dialysis [facility])    [facility] in [place], [place] on MWF   . Gangrene (HCC)    left leg  . History of CVA (cerebrovascular accident)    05-13-2017  per pt stated "thats what they told yrs ago"  per pt no residual  . Hyperlipidemia   . Hypertension   . Myocardial infarction (HCC)   . Osteoarthritis   . Stroke (HCC)   . Type 2 diabetes mellitus treated with insulin (HCC)    Type II    Past Surgical History:  Procedure Laterality Date  . ABDOMINAL AORTOGRAM W/LOWER EXTREMITY N/A 06/25/2017   Procedure: ABDOMINAL AORTOGRAM W/LOWER EXTREMITY;  Surgeon: Maeola Harman, MD;  Location: [facility] INVASIVE CV LAB;  Service: Cardiovascular;  Laterality: N/A;  . AMPUTATION Right 01/01/2017   Procedure: REVISION AMPUTATION RIGHT INDEX FINGER;  Surgeon: Dairl Ponder, MD;   Location: [facility] OR;  Service: Orthopedics;  Laterality: Right;  . AMPUTATION Right 07/15/2017   Procedure: AMPUTATION BELOW KNEE RIGHT;  Surgeon: [name], MD;  Location: [facility] OR;  Service: Vascular;  Laterality: Right;  . AMPUTATION Right 10/08/2017   Procedure: RIGHT INDEX, RIGHT LONG REVISION AMPUTATIONS;  Surgeon: Dairl Ponder, MD;  Location: [facility] OR;  Service: Orthopedics;  Laterality: Right;  . AMPUTATION Left 01/02/2018   Procedure: LEFT BELOW KNEE AMPUTATION;  Surgeon: Nadara Mustard, MD;  Location: [name] OR;  Service: Orthopedics;  Laterality: Left;  . AMPUTATION Right 02/21/2018   Procedure: RIGHT LONG FINGER REVISION AMPUTATION;  Surgeon: Dairl Ponder, MD;  Location: [facility] OR;  Service: Orthopedics;  Laterality: Right;  . ARTERIOVENOUS GRAFT PLACEMENT    . ARTERIOVENOUS GRAFT PLACEMENT Left 10/22/2016   thigh  . AV FISTULA PLACEMENT  03/31/2012   Procedure: ARTERIOVENOUS (AV) FISTULA CREATION;  Surgeon: [name], MD;  Location: [facility] OR;  Service: Vascular;  Laterality: Right;  First stage Brachial vein transposition  . AV FISTULA PLACEMENT Right 08/25/2012   Procedure: INSERTION OF ARTERIOVENOUS (AV) GORE-TEX GRAFT ARM;  Surgeon: [name], MD;  Location: [facility] OR;  Service: Vascular;  Laterality: Right;  . AV FISTULA PLACEMENT Left 07/23/2016   Procedure: INSERTION OF ARTERIOVENOUS (AV) GORE-TEX GRAFT LEFT UPPER ARM;  Surgeon: [name], MD;  Location: [facility] OR;  Service: Vascular;  Laterality: Left;  . AV FISTULA PLACEMENT  Left 10/22/2016   Procedure: INSERTION OF 4-13mm x 45cm  ARTERIOVENOUS (AV) GORE-TEX GRAFT THIGH-LEFT;  Surgeon: [name], MD;  Location: [facility] OR;  Service: Vascular;  Laterality: Left;  . CATARACT EXTRACTION W/ INTRAOCULAR LENS  IMPLANT, BILATERAL    . COLONOSCOPY N/A 03/25/2017   Dr. Darrick Penna: examined portion of ileum normal. Redundant left colon, stricture at anus   . ESOPHAGOGASTRODUODENOSCOPY N/A 03/25/2017   Dr. Darrick Penna: widely patent Schatzki ring at GE  junction s/p dilation, atrophic gastritis, single duodenal AVM, non-bleeding  . INSERTION OF DIALYSIS CATHETER Left 05/05/2012   Procedure: INSERTION OF DIALYSIS CATHETER;  Surgeon: Chuck Hint, MD;  Location: [facility] OR;  Service: Vascular;  Laterality: Left;  . INSERTION OF DIALYSIS CATHETER Right 10/01/2016   Procedure: INSERTION OF DIALYSIS CATHETER- RIGHT FEMORAL;  Surgeon: [name], MD;  Location: [facility] OR;  Service: Vascular;  Laterality: Right;  . IR FLUORO GUIDE CV LINE RIGHT  06/10/2016  . IR GENERIC HISTORICAL  06/07/2016   IR US GUIDE VASC ACCESS RIGHT 06/07/2016 Oley Balm, MD [facility]-INTERV RAD  . IR THROMBECTOMY AV FISTULA W/THROMBOLYSIS/PTA INC/SHUNT/IMG RIGHT Right 06/07/2016  . IR US GUIDE VASC ACCESS RIGHT  06/10/2016  . LIGATION ARTERIOVENOUS GORTEX GRAFT Left 08/04/2016   Procedure: LIGATION ARTERIOVENOUS GORTEX GRAFT;  Surgeon: Larina Earthly, MD;  Location: [facility] OR;  Service: Vascular;  Laterality: Left;  . LIGATION ARTERIOVENOUS GORTEX GRAFT Right 11/26/2016   Procedure: LIGATION OF RIGHT UPPER ARM ARTERIOVENOUS GORTEX GRAFT;  Surgeon: [name], MD;  Location: [facility] OR;  Service: Vascular;  Laterality: Right;  . LIGATION OF ARTERIOVENOUS  FISTULA Right 08/25/2012   Procedure: LIGATION OF ARTERIOVENOUS  FISTULA;  Surgeon: [name], MD;  Location: [facility] OR;  Service: Vascular;  Laterality: Right;  Ligation of right brachial vein transposition  . PERIPHERAL VASCULAR ATHERECTOMY  06/25/2017   Procedure: PERIPHERAL VASCULAR ATHERECTOMY;  Surgeon: Maeola Harman, MD;  Location: [facility] INVASIVE CV LAB;  Service: Cardiovascular;;  Rt. PT  . REMOVAL OF A DIALYSIS CATHETER Right 05/05/2012   Procedure: REMOVAL OF A DIALYSIS CATHETER;  Surgeon: Chuck Hint, MD;  Location: [facility] OR;  Service: Vascular;  Laterality: Right;  . REMOVAL OF A DIALYSIS CATHETER Right 10/01/2016   Procedure: REMOVAL OF A DIALYSIS CATHETER-RIGHT UPPER CHEST;  Surgeon: [name], MD;  Location: [facility]  OR;  Service: Vascular;  Laterality: Right;  . REMOVAL OF A DIALYSIS CATHETER Right 11/26/2016   Procedure: REMOVAL OF RIGHT FEMORAL TUNNEL DIALYSIS CATHETER;  Surgeon: [name], MD;  Location: [facility] OR;  Service: Vascular;  Laterality: Right;  . SPHINCTEROTOMY N/A 05/15/2017   Procedure: POSSIBLE SPHINCTEROTOMY (BOTOX);  Surgeon: Romie Levee, MD;  Location: [facility];  Service: General;  Laterality: N/A;  . TOE AMPUTATION  02/2007   left foot first 3 toes  . UPPER EXTREMITY VENOGRAPHY Bilateral 07/18/2016   Procedure: Bilateral Upper Extremity Venography;  Surgeon: [name], MD;  Location: [facility] INVASIVE CV LAB;  Service: Cardiovascular;  Laterality: Bilateral;    Family History  Problem Relation Age of Onset  . Diabetes Mother   . Hypertension Mother   . AAA (abdominal aortic aneurysm) Mother   . Diabetes Father   . Hypertension Father     SOCIAL HISTORY: Social History   Tobacco Use  . Smoking status: Never Smoker  . Smokeless tobacco: Never Used  Substance Use Topics  . Alcohol use: No    Allergies  Allergen Reactions  .  Lisinopril Other (See Comments)    HYPERKALEMIA RENAL DYSFUNCTION PROGRESSION     Current Outpatient Medications  Medication Sig Dispense Refill  . acetaminophen (TYLENOL) 325 MG tablet Take 325 mg by mouth 4 (four) times daily as needed for mild pain or fever.    Marland Kitchen. amitriptyline (ELAVIL) 25 MG tablet Take 25 mg by mouth daily at 8 pm.     . aspirin EC 81 MG tablet Take 81 mg by mouth daily.    . ciprofloxacin (CIPRO) 500 MG tablet Take 1 tablet (500 mg total) by mouth 2 (two) times daily for 10 days. 20 tablet 0  . dextrose (GLUTOSE) 40 % GEL Take 1 Tube by mouth once as needed for low blood sugar.    . dicyclomine (BENTYL) 10 MG capsule 1 PO 30 MINUTES PRIOR TO BREAKFAST AND LUNCH (Patient taking differently: Take 10 mg by mouth as needed (diarrhea). ) 62 capsule 11  . HYDROcodone-acetaminophen (NORCO/VICODIN) 5-325 MG tablet  Take 1 tablet by mouth every 6 (six) hours as needed for moderate pain. 15 tablet 0  . insulin aspart (NOVOLOG) 100 UNIT/ML injection Inject into the skin 3 (three) times daily before meals. Sliding scale    . insulin lispro (HUMALOG) 100 UNIT/ML injection Inject 0-8 Units into the skin 3 (three) times daily before meals. Blood sugar of 70-100=0 units 101-250=1 unit 151-200=2 units 201-250=4 units  251-300=6 units 301-350= 8 units Greater than 350= 10 units **Call MD if glucose greater than 450**    . ipratropium (ATROVENT) 0.02 % nebulizer solution Take 0.4 mg by nebulization every 6 (six) hours as needed (congestion).    . iron polysaccharides (NIFEREX) 150 MG capsule Take 1 capsule (150 mg total) by mouth 2 (two) times daily.    . isosorbide mononitrate (IMDUR) 30 MG 24 hr tablet Take 30 mg by mouth daily.    Marland Kitchen. losartan (COZAAR) 50 MG tablet Take 50 mg by mouth daily.     . multivitamin (RENA-VIT) TABS tablet Take 1 tablet by mouth daily.  0  . pantoprazole (PROTONIX) 40 MG tablet Take 1 tablet (40 mg total) by mouth 2 (two) times daily with a meal. (Patient taking differently: Take 40 mg by mouth daily as needed (gerd). )    . polyethylene glycol (MIRALAX / GLYCOLAX) packet Take 17 g by mouth as needed for moderate constipation.    . senna (SENOKOT) 8.6 MG tablet Take 2 tablets by mouth daily as needed for constipation.    . sevelamer carbonate (RENVELA) 800 MG tablet Take 2 tablets (1,600 mg total) by mouth 3 (three) times daily with meals. (Patient taking differently: Take 1,600 mg by mouth 3 (three) times daily with meals. (0800, 1200, 1700))     No current facility-administered medications for this visit.    Facility-Administered Medications Ordered in Other Visits  Medication Dose Route Frequency Provider Last Rate Last Dose  . 0.9 %  sodium chloride infusion (Manually program via Guardrails IV Fluids)   Intravenous Once Rayburn, Fanny BienShawn Montgomery, PA-C        REVIEW OF SYSTEMS:    [X]  denotes positive finding, [ ]  denotes negative finding Cardiac  Comments:  Chest pain or chest pressure:    Shortness of breath upon exertion:    Short of breath when lying flat:    Irregular heart rhythm:        Vascular    Pain in calf, thigh, or hip brought on by ambulation:    Pain in feet at night that  wakes you up from your sleep:     Blood clot in your veins:    Leg swelling:         Pulmonary    Oxygen at home:    Productive cough:     Wheezing:         Neurologic    Sudden weakness in arms or legs:     Sudden numbness in arms or legs:     Sudden onset of difficulty speaking or slurred speech:    Temporary loss of vision in one eye:     Problems with dizziness:         Gastrointestinal    Blood in stool:     Vomited blood:         Genitourinary    Burning when urinating:     Blood in urine:        Psychiatric    Major depression:         Hematologic    Bleeding problems:    Problems with blood clotting too easily:        Skin    Rashes or ulcers:        Constitutional    Fever or chills:      PHYSICAL EXAM: Vitals:   03/06/18 0840  BP: 121/62  Pulse: 82  Resp: 20  Temp: (!) 97.4 F (36.3 C)  SpO2: 96%  Weight: 113 lb (51.3 kg)  Height: 5\' 7"  (1.702 m)    GENERAL: The patient is a well-nourished male, in no acute distress. The vital signs are documented above. CARDIAC: There is a regular rate and rhythm.  VASCULAR:  Palpable femoral pulses bilaterally Right BKA well healed Left BKA open wound with fibrinous exudate and eschar PULMONARY: There is good air exchange bilaterally without wheezing or rales. ABDOMEN: Soft and non-tender with normal pitched bowel sounds.  MUSCULOSKELETAL: There are no major deformities or cyanosis. NEUROLOGIC: No focal weakness or paresthesias are detected. SKIN: There are no ulcers or rashes noted. PSYCHIATRIC: The patient has a normal affect.  DATA:   None  Assessment/Plan:  82 year old male with  multiple medical comorbidities including end-stage renal disease who is nonambulatory in a nursing facility and has a nonhealing left BKA stump.  I recommended revision to an above-knee amputation.  Patient does have a palpable left femoral pulse and I am optimistic that he has a good chance of healing this wound.  Risks and benefits were discussed with family and we will schedule for next week on a nondialysis day.  Place him in a wet-to-dry dressing at this time.  Cephus Shelling, MD [facility] Office: [phone] Pager: [phone]  Canary Brim Clark]

## 2018-03-07 NOTE — Pre-Procedure Instructions (Signed)
    SIG CLISHAM  03/07/2018     Your procedure is scheduled on January 2.  Report to [facility] Entrance A at 8:15 A.M.  Call this number if you have problems the morning of surgery:  [phone]   Remember:  Do not eat or drink after midnight.   Give these medicines the morning of surgery with A SIP OF WATER: Tylenol OR Hydrocodone (if needed), Isosorbide, Protonix    Please send a MAR printed the morning of surgery that we can see the last dose of medication that have been given.   Please send any cardiac test such as EKG, Echo, Stress test   Please send most recent H&P      How do I manage my blood sugar before surgery? o Check patient's blood sugar the morning of your surgery when you wake up and every 2 hours until you get to the Short Stay unit. . If your blood sugar is less than 70 mg/dL, you will need to treat for low blood sugar: o Do not take insulin. o Treat a low blood sugar (less than 70 mg/dL) with  cup of clear juice (cranberry or apple), 4 glucose tablets, OR glucose gel. Recheck blood sugar in 15 minutes after treatment (to make sure it is greater than 70 mg/dL). If your blood sugar is not greater than 70 mg/dL on recheck follow facility protocol.   . If your CBG is greater than 220 mg/dL, you may give  of your sliding scale (correction) dose of insulin.    Do not wear jewelry, make-up or nail polish.  Do not wear lotions, powders, or perfumes, or deodorant.  Do not shave 48 hours prior to surgery.  Men may shave face and neck.  Do not bring valuables to the hospital.  [facility] is not responsible for any belongings or valuables.  Contacts, dentures or bridgework may not be worn into surgery.  Leave your suitcase in the car.  After surgery it may be brought to your room.  For patients admitted to the hospital, discharge time will be determined by your treatment team.

## 2018-03-09 ENCOUNTER — Telehealth: Payer: Self-pay | Admitting: *Deleted

## 2018-03-09 NOTE — Progress Notes (Signed)
I spoke with Mr Hayen nurse at [facility], Clydie Braun.  Clydie Braun reports that a family member who is a bed side states that patient does not want to have any more surgeries.  I told Clydie Braun that someone needs to call Dr Burna Forts office and tell them that patient is cancelling.

## 2018-03-09 NOTE — Telephone Encounter (Signed)
Charles Vasquez' sister called to say that he has decided not to have surgery and is in comfort care at this time. He has refused to go on dialysis and his BP is low 80s. Palliative care has taken over while he is in the SNF. I cancelled the pateint's scheduled AKA with Dr. Chestine Spore.

## 2018-03-10 ENCOUNTER — Ambulatory Visit (INDEPENDENT_AMBULATORY_CARE_PROVIDER_SITE_OTHER): Payer: Medicare Other | Admitting: Physician Assistant

## 2018-03-10 DIAGNOSIS — N186 End stage renal disease: Secondary | ICD-10-CM | POA: Diagnosis not present

## 2018-03-10 DIAGNOSIS — Z992 Dependence on renal dialysis: Secondary | ICD-10-CM | POA: Diagnosis not present

## 2018-03-12 ENCOUNTER — Inpatient Hospital Stay (HOSPITAL_COMMUNITY): Admission: RE | Admit: 2018-03-12 | Payer: Medicare Other | Source: Home / Self Care | Admitting: Vascular Surgery

## 2018-03-12 ENCOUNTER — Ambulatory Visit (INDEPENDENT_AMBULATORY_CARE_PROVIDER_SITE_OTHER): Payer: Medicare Other | Admitting: Orthopedic Surgery

## 2018-03-12 ENCOUNTER — Encounter (HOSPITAL_COMMUNITY): Admission: RE | Payer: Self-pay | Source: Home / Self Care

## 2018-03-12 SURGERY — AMPUTATION, ABOVE KNEE
Anesthesia: General | Laterality: Left

## 2018-03-26 ENCOUNTER — Telehealth: Payer: Self-pay | Admitting: Gastroenterology

## 2018-03-26 NOTE — Telephone Encounter (Signed)
Called patient with reminder call and wife stated he passed away about a week ago.

## 2018-03-26 NOTE — Telephone Encounter (Signed)
Sympathy card sent 

## 2018-03-26 NOTE — Telephone Encounter (Signed)
REVIEWED. SEND A SYMPATHY CARD.

## 2018-03-27 ENCOUNTER — Ambulatory Visit: Payer: Medicare Other | Admitting: Nurse Practitioner

## 2018-04-11 DEATH — deceased

## 2018-04-17 ENCOUNTER — Ambulatory Visit: Payer: Medicare Other | Admitting: Gastroenterology

## 2018-05-11 ENCOUNTER — Ambulatory Visit: Payer: Medicare Other | Admitting: Nurse Practitioner

## 2019-02-08 IMAGING — US IR CV CATH DECLOT W/THROMBOLYTIC DWELL
1 series · 2 of 2 positions shown · non-contrast
Comparison: 06/23/2015

CLINICAL DATA: Occluded dialysis graft
TECHNIQUE: The procedure, risks (including but not limited to bleeding,
infection, organ damage ), benefits, and alternatives were explained
to the patient and spouse. Questions regarding the procedure were
encouraged and answered. The patient understands and consents to the
procedure.

[Series 1: ir (id) (id)/(id)/(id) ir · 2 of 2 slices shown]
[im 1/2]
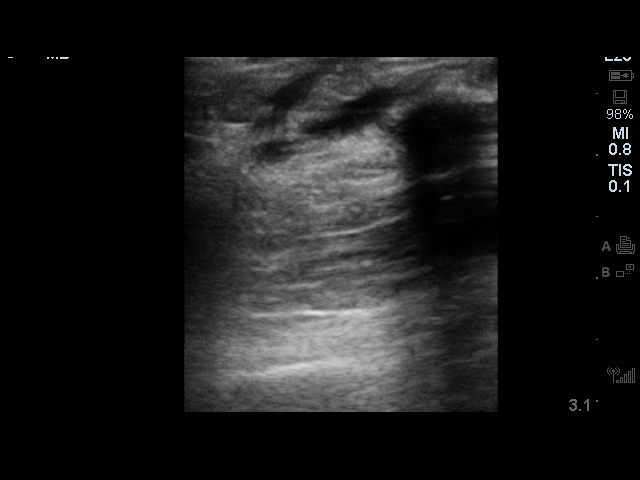
[im 2/2]
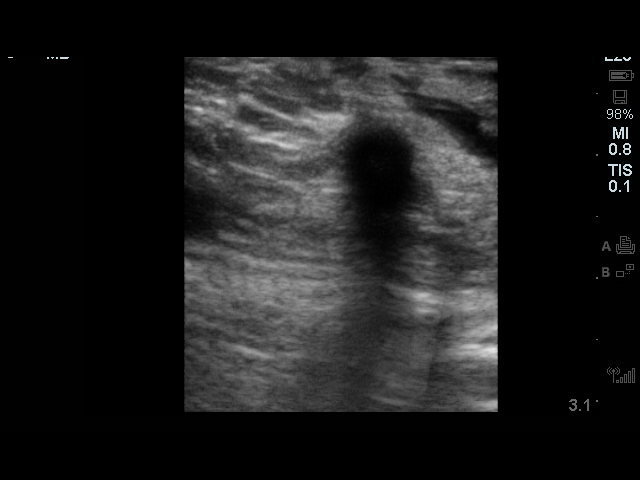

[2 of 2 positions shown; findings below may reference images not displayed]

EXAM:
EXAM
DIALYSIS GRAFT DECLOT

VENOUS ANGIOPLASTY

ULTRASOUND GUIDANCE FOR VASCULAR ACCESS X2
Intravenous Fentanyl and Versed were administered as conscious
sedation during continuous monitoring of the patient's level of
consciousness and physiological / cardiorespiratory status by the
radiology RN, with a total moderate sedation time of 50 minutes. The
graft just central to the arterial anastomosis was accessed
antegrade with a 21-gauge micropuncture needle under real-time
ultrasonic guidance after the overlying skin prepped with
chlorhexidine, draped in usual sterile fashion, infiltrated locally
with 1% lidocaine. Needle exchanged over 018 guidewire for
transitional dilator through which 2 mg t-PA was administered.
Ultrasound imaging documentation was saved. Through the dilator, a
Bentson wire was advanced to the venous anastomosis. Over this a 6F
sheath was placed, through which a 5 French Kumpe catheter was
advanced for outflow venography. This showed patency of the outflow
venous system through the SVC. 4222 units heparin were administered.
The Arrow PTD device was used to macerate thrombus in the graft. A
10 mm x 4 cm most angioplasty balloon was used to dilate stenoses in
the stents in the graft and across the venous anastomosis in just
centrally in the outflow vein. Contrast injection shows good patency
through the segments.

In similar fashion, the more central aspect of the graft between 2
stented segments was accessed retrograde under ultrasound with a
micropuncture needle, exchanged for a transitional dilator. The
venous limb dilator was exchanged in similar fashion for a 6 French
vascular sheath. The PTD device would not advance into the afferent
limb of the artery. The 5 French Kumpe the catheter was utilized to
catheterize the afferent limb of the artery, then exchanged for an
over the wire Fogarty balloon catheter, used to dislodge the
platelet plug into the the graft with 2 passes. The PTD device was
used again to remove any residual platelet plug from the graft.
Injection showed clearance of thrombus from the graft. Balloon
angioplasty of the proximal graft was performed using a 7 mm x 4 cm
Conquest angioplasty balloon with good response. Balloon was removed
and injection showed patency of the anastomosis and graft, without
extravasation or other apparent complication. No outflow occlusion.
Mild stenosis of the right brachiocephalic vein without evidence of
hemodynamic significance.

A follow shuntogram was performed, demonstrating good flow through
the outflow vein, no extravasation. Reflux across the arterial
anastomosis demonstrate this to be widely patent, with unremarkable
appearance of the visualized native arterial circulation. The
catheter and sheaths were then removed and hemostasis achieved with
2-0 Ethilon sutures. Patient tolerated procedure well.

FLUOROSCOPY TIME:  3.6 minutes (33 uNymJ DAP)

COMPLICATIONS:
COMPLICATIONS
none
IMPRESSION: 1. Technically successful declot of right upper arm straight
synthetic hemodialysis graft.
2. Technically successful balloon angioplasty of intragraft and
venous anastomotic stenoses.

ACCESS:
Remains approachable for percutaneous intervention as needed.

## 2019-04-20 ENCOUNTER — Other Ambulatory Visit: Payer: Self-pay | Admitting: Gastroenterology

## 2019-04-20 DIAGNOSIS — D649 Anemia, unspecified: Secondary | ICD-10-CM

## 2019-04-20 DIAGNOSIS — T189XXA Foreign body of alimentary tract, part unspecified, initial encounter: Secondary | ICD-10-CM
